# Patient Record
Sex: Male | Born: 1940 | Race: White | Hispanic: No | Marital: Married | Smoking: Former smoker
Health system: Southern US, Community
[De-identification: ages and names within clinical notes are randomized; demographics above are authoritative.]

## PROBLEM LIST (undated history)

## (undated) DIAGNOSIS — N184 Chronic kidney disease, stage 4 (severe): Secondary | ICD-10-CM

## (undated) DIAGNOSIS — I10 Essential (primary) hypertension: Secondary | ICD-10-CM

## (undated) DIAGNOSIS — I712 Thoracic aortic aneurysm, without rupture: Secondary | ICD-10-CM

## (undated) DIAGNOSIS — I480 Paroxysmal atrial fibrillation: Secondary | ICD-10-CM

## (undated) DIAGNOSIS — G589 Mononeuropathy, unspecified: Secondary | ICD-10-CM

## (undated) DIAGNOSIS — I509 Heart failure, unspecified: Secondary | ICD-10-CM

## (undated) DIAGNOSIS — Z87442 Personal history of urinary calculi: Secondary | ICD-10-CM

## (undated) DIAGNOSIS — S149XXA Injury of unspecified nerves of neck, initial encounter: Secondary | ICD-10-CM

## (undated) DIAGNOSIS — M1990 Unspecified osteoarthritis, unspecified site: Secondary | ICD-10-CM

## (undated) DIAGNOSIS — S065X9A Traumatic subdural hemorrhage with loss of consciousness of unspecified duration, initial encounter: Secondary | ICD-10-CM

## (undated) DIAGNOSIS — I428 Other cardiomyopathies: Secondary | ICD-10-CM

## (undated) DIAGNOSIS — S065XAA Traumatic subdural hemorrhage with loss of consciousness status unknown, initial encounter: Secondary | ICD-10-CM

## (undated) DIAGNOSIS — T8859XA Other complications of anesthesia, initial encounter: Secondary | ICD-10-CM

## (undated) DIAGNOSIS — D696 Thrombocytopenia, unspecified: Secondary | ICD-10-CM

## (undated) DIAGNOSIS — I7121 Aneurysm of the ascending aorta, without rupture: Secondary | ICD-10-CM

## (undated) DIAGNOSIS — C9110 Chronic lymphocytic leukemia of B-cell type not having achieved remission: Secondary | ICD-10-CM

## (undated) DIAGNOSIS — J189 Pneumonia, unspecified organism: Secondary | ICD-10-CM

## (undated) DIAGNOSIS — I2510 Atherosclerotic heart disease of native coronary artery without angina pectoris: Secondary | ICD-10-CM

## (undated) HISTORY — DX: Chronic kidney disease, stage 4 (severe): N18.4

## (undated) HISTORY — DX: Traumatic subdural hemorrhage with loss of consciousness status unknown, initial encounter: S06.5XAA

## (undated) HISTORY — DX: Thrombocytopenia, unspecified: D69.6

## (undated) HISTORY — DX: Traumatic subdural hemorrhage with loss of consciousness of unspecified duration, initial encounter: S06.5X9A

## (undated) HISTORY — PX: OTHER SURGICAL HISTORY: SHX169

## (undated) HISTORY — PX: TONSILLECTOMY: SUR1361

## (undated) HISTORY — DX: Paroxysmal atrial fibrillation: I48.0

## (undated) HISTORY — DX: Atherosclerotic heart disease of native coronary artery without angina pectoris: I25.10

## (undated) HISTORY — PX: THYROIDECTOMY, PARTIAL: SHX18

## (undated) HISTORY — DX: Other cardiomyopathies: I42.8

## (undated) HISTORY — PX: CATARACT EXTRACTION, BILATERAL: SHX1313

---

## 2011-06-15 ENCOUNTER — Encounter (INDEPENDENT_AMBULATORY_CARE_PROVIDER_SITE_OTHER): Payer: Self-pay | Admitting: *Deleted

## 2011-06-22 ENCOUNTER — Telehealth: Payer: Self-pay

## 2011-06-22 ENCOUNTER — Other Ambulatory Visit: Payer: Self-pay

## 2011-06-22 DIAGNOSIS — Z139 Encounter for screening, unspecified: Secondary | ICD-10-CM

## 2011-06-22 NOTE — Telephone Encounter (Signed)
Gastroenterology Pre-Procedure Form  Pt said he had last colonoscopy 10 years ago in [place]/ no polyps  Request Date: 06/22/2011       Requesting Physician: Dr. Wende Neighbors     PATIENT INFORMATION:  Andrew [name] is a 71 y.o., male (DOB=02/26/1941).  PROCEDURE: Procedure(s) requested: colonoscopy Procedure Reason: screening for colon cancer  PATIENT REVIEW QUESTIONS: The patient reports the following:   1. Diabetes Melitis: no 2. Joint replacements in the past 12 months: no 3. Major health problems in the past 3 months: no 4. Has an artificial valve or MVP:no 5. Has been advised in past to take antibiotics in advance of a procedure like teeth cleaning: no}    MEDICATIONS & ALLERGIES:    Patient reports the following regarding taking any blood thinners:   Plavix? no Aspirin?yes  Coumadin?  no  Patient confirms/reports the following medications:  Current Outpatient Prescriptions  Medication Sig Dispense Refill  . aspirin 325 MG tablet Take 325 mg by mouth daily.      . carvedilol (COREG) 12.5 MG tablet Take 12.5 mg by mouth 2 (two) times daily with a meal.      . Olmesartan-Amlodipine-HCTZ (TRIBENZOR) 40-10-25 MG TABS Take by mouth 1 day or 1 dose. Takes once daily        Patient confirms/reports the following allergies:  No Known Allergies  Patient is appropriate to schedule for requested procedure(s): yes  AUTHORIZATION INFORMATION Primary Insurance:   ID #:   Group #:  Pre-Cert / Auth required: Pre-Cert / Auth #:   Secondary Insurance:   ID #:  Group #:  Pre-Cert / Auth required:  Pre-Cert / Auth #:   No orders of the defined types were placed in this encounter.    SCHEDULE INFORMATION: Procedure has been scheduled as follows:  Date: 07/10/2011       Time: 10:30 AM  Location: [facility] Short Stay  This Gastroenterology Pre-Precedure Form is being routed to the following provider(s) for review: Barney Drain, MD

## 2011-06-22 NOTE — Telephone Encounter (Signed)
MOVI PREP SPLIT DOSING, REGULAR BREAKFAST. CLEAR LIQUIDS AFTER 9 AM.  

## 2011-06-22 NOTE — Telephone Encounter (Signed)
Rx and instructions mailed.

## 2011-06-26 ENCOUNTER — Encounter (HOSPITAL_COMMUNITY): Payer: Self-pay | Admitting: Pharmacy Technician

## 2011-07-07 MED ORDER — SODIUM CHLORIDE 0.45 % IV SOLN
Freq: Once | INTRAVENOUS | Status: AC
  Administered 2011-07-10: 10:00:00 via INTRAVENOUS

## 2011-07-10 ENCOUNTER — Other Ambulatory Visit: Payer: Self-pay | Admitting: Gastroenterology

## 2011-07-10 ENCOUNTER — Encounter (HOSPITAL_COMMUNITY): Payer: Self-pay | Admitting: *Deleted

## 2011-07-10 ENCOUNTER — Ambulatory Visit (HOSPITAL_COMMUNITY)
Admission: RE | Admit: 2011-07-10 | Discharge: 2011-07-10 | Disposition: A | Discharge status: Home / Self Care | Payer: Medicare Other | Source: Ambulatory Visit | Attending: Gastroenterology | Admitting: Gastroenterology

## 2011-07-10 ENCOUNTER — Encounter (HOSPITAL_COMMUNITY)
Admission: RE | Disposition: A | Discharge status: Home / Self Care | Payer: Self-pay | Source: Ambulatory Visit | Attending: Gastroenterology

## 2011-07-10 DIAGNOSIS — I10 Essential (primary) hypertension: Secondary | ICD-10-CM | POA: Insufficient documentation

## 2011-07-10 DIAGNOSIS — D126 Benign neoplasm of colon, unspecified: Secondary | ICD-10-CM | POA: Insufficient documentation

## 2011-07-10 DIAGNOSIS — Z1211 Encounter for screening for malignant neoplasm of colon: Secondary | ICD-10-CM | POA: Insufficient documentation

## 2011-07-10 DIAGNOSIS — Z79899 Other long term (current) drug therapy: Secondary | ICD-10-CM | POA: Insufficient documentation

## 2011-07-10 DIAGNOSIS — Z139 Encounter for screening, unspecified: Secondary | ICD-10-CM

## 2011-07-10 DIAGNOSIS — K5730 Diverticulosis of large intestine without perforation or abscess without bleeding: Secondary | ICD-10-CM

## 2011-07-10 DIAGNOSIS — K648 Other hemorrhoids: Secondary | ICD-10-CM | POA: Insufficient documentation

## 2011-07-10 DIAGNOSIS — Z7982 Long term (current) use of aspirin: Secondary | ICD-10-CM | POA: Insufficient documentation

## 2011-07-10 HISTORY — PX: COLONOSCOPY: SHX5424

## 2011-07-10 HISTORY — DX: Essential (primary) hypertension: I10

## 2011-07-10 SURGERY — COLONOSCOPY
Anesthesia: Moderate Sedation

## 2011-07-10 MED ORDER — MEPERIDINE HCL 100 MG/ML IJ SOLN
INTRAMUSCULAR | Status: DC | PRN
  Administered 2011-07-10: 50 mg via INTRAVENOUS
  Administered 2011-07-10: 25 mg via INTRAVENOUS

## 2011-07-10 MED ORDER — MIDAZOLAM HCL 5 MG/5ML IJ SOLN
INTRAMUSCULAR | Status: AC
  Filled 2011-07-10: qty 5

## 2011-07-10 MED ORDER — STERILE WATER FOR IRRIGATION IR SOLN
Status: DC | PRN
  Administered 2011-07-10: 10:00:00

## 2011-07-10 MED ORDER — MIDAZOLAM HCL 5 MG/5ML IJ SOLN
INTRAMUSCULAR | Status: DC | PRN
  Administered 2011-07-10 (×2): 2 mg via INTRAVENOUS

## 2011-07-10 MED ORDER — MEPERIDINE HCL 100 MG/ML IJ SOLN
INTRAMUSCULAR | Status: AC
  Filled 2011-07-10: qty 1

## 2011-07-10 NOTE — H&P (Signed)
  Primary Care Physician:  Wende Neighbors, MD, MD Primary Gastroenterologist:  Dr. Oneida Alar  Pre-Procedure History & Physical: HPI:  Andrew [name] is a 71 y.o. male here for COLON CANCER SCREENING.   Past Medical History  Diagnosis Date  . Hypertension   . Dysrhythmia     Hx. of Atrial Fibrillation- has resolved    Past Surgical History  Procedure Date  . Thyroidectomy, partial   . Cataract extraction, bilateral   . Tonsillectomy     Prior to Admission medications   Medication Sig Start Date End Date Taking? Authorizing Provider  aspirin 325 MG tablet Take 325 mg by mouth daily.   Yes Historical Provider, MD  carvedilol (COREG) 12.5 MG tablet Take 12.5 mg by mouth 2 (two) times daily with a meal.   Yes Historical Provider, MD  Multiple Vitamin (MULITIVITAMIN WITH MINERALS) TABS Take 1 tablet by mouth daily.   Yes Historical Provider, MD  Olmesartan-Amlodipine-HCTZ (TRIBENZOR) 40-10-25 MG TABS Take 1 tablet by mouth daily.    Yes Historical Provider, MD    Allergies as of 06/22/2011  . (No Known Allergies)    Family History  Problem Relation Age of Onset  . Colon cancer Neg Hx   NO COLON CA OR POLYPS   History   Social History  . Marital Status: Married    Spouse Name: N/A    Number of Children: N/A  . Years of Education: N/A   Occupational History  . Not on file.   Social History Main Topics  . Smoking status: Former Smoker -- 1.0 packs/day for 15 years  . Smokeless tobacco: Not on file  . Alcohol Use: No  . Drug Use: No  . Sexually Active:    Other Topics Concern  . Not on file   Social History Narrative  . No narrative on file    Review of Systems: See HPI, otherwise negative ROS   Physical Exam: BP 160/96  Pulse 70  Temp(Src) 98.2 F (36.8 C) (Oral)  Resp 18  Ht 6\' 3"  (1.905 m)  Wt 230 lb (104.327 kg)  BMI 28.75 kg/m2  SpO2 94% General:   Alert,  pleasant and cooperative in NAD Head:  Normocephalic and atraumatic. Neck:  Supple. Lungs:   Clear throughout to auscultation.    Heart:  Regular rate and rhythm. Abdomen:  Soft, nontender and nondistended. Normal bowel sounds, without guarding, and without rebound.   Neurologic:  Alert and  oriented x4;  grossly normal neurologically.  Impression/Plan:     AVERAGE RISK  PLAN: TCS TODAY

## 2011-07-11 NOTE — Op Note (Signed)
[facility] [address] [place], [place]  [address]  COLONOSCOPY PROCEDURE REPORT  PATIENT:  Andrew [name], Andrew [name]  MR#:  [id number] BIRTHDATE:  [birth year], 70 yrs. old  GENDER:  male  ENDOSCOPIST:  Barney Drain, MD REF. BY:  Delphina Cahill, M.D. ASSISTANT:  PROCEDURE DATE:  07/10/2011 PROCEDURE:  Colonoscopy with biopsy and snare polypectomy  INDICATIONS:  SCREENING  MEDICATIONS:   Demerol 75 mg IV, Versed 4 mg IV  DESCRIPTION OF PROCEDURE:    Physical exam was performed. Informed consent was obtained from the patient after explaining the benefits, risks, and alternatives to procedure.  The patient was connected to monitor and placed in left lateral position. Continuous oxygen was provided by nasal cannula and IV medicine administered through an indwelling cannula.  After administration of sedation and rectal exam, the patient's rectum was intubated and the [id number] colonoscope was advanced under direct visualization to the cecum.  The scope was removed slowly by carefully examining the color, texture, anatomy, and integrity mucosa on the way out.  The patient was recovered in endoscopy and discharged home in satisfactory condition. <<PROCEDUREIMAGES>>  FINDINGS:  A 3 MM sessile polyp was found in the proximal transverse colon & REMOVED VIA COLD FORCEPS.  A  4 MM & 6 MM sessile polyp wERE found in the ascending colon & REMOVED VIA SNARE CAUTERY/COLD FORCEPS .  A 3 MM & 8 MM sessile polyp wERE found in the sigmoid colon & REMOVED VIA SNARE CAUTERY/COLD FORCEPS.  FREQUENT Diverticula were found in the sigmoid to descending colon segments.  SMALL Hemorrhoids were found.  PREP QUALITY: EXCELLENT CECAL W/D TIME:    26 minutes  COMPLICATIONS:    None  ENDOSCOPIC IMPRESSION: 1) Sessile polyp in the proximal transverse colon 2) Sessile polyp in the ascending colon 3) Sessile polyp in the sigmoid colon 4) Diverticula in the sigmoid to descending colon segments 5)  Internal hemorrhoids  RECOMMENDATIONS: AWAIT BIOPSIES TCS IN 10 YEARS W/ 1 HOUR TIME SLOT HIGH FIBER DIET  REPEAT EXAM:  No  ______________________________ Barney Drain, MD  CC:  Delphina Cahill, M.D.  n. eSIGNED:   Kimika Streater at 07/11/2011 03:27 PM  Valentina Lucks, [id number]

## 2011-07-13 ENCOUNTER — Telehealth: Payer: Self-pay | Admitting: Gastroenterology

## 2011-07-13 NOTE — Telephone Encounter (Signed)
Please call pt. 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 TCS in 10-15 YEARS. FOLLOW A High fiber diet.

## 2011-07-13 NOTE — Telephone Encounter (Signed)
Pt informed

## 2011-07-13 NOTE — Telephone Encounter (Signed)
Reminder in epic to have tcs in 10-15 yrs

## 2011-07-17 ENCOUNTER — Encounter (HOSPITAL_COMMUNITY): Payer: Self-pay | Admitting: Gastroenterology

## 2011-07-17 NOTE — Telephone Encounter (Signed)
Faxed to PCP

## 2013-01-12 ENCOUNTER — Encounter (HOSPITAL_COMMUNITY): Payer: Self-pay | Admitting: Emergency Medicine

## 2013-01-12 ENCOUNTER — Emergency Department (HOSPITAL_COMMUNITY)
Admission: EM | Admit: 2013-01-12 | Discharge: 2013-01-12 | Disposition: A | Discharge status: Home / Self Care | Payer: Medicare Other | Attending: Emergency Medicine | Admitting: Emergency Medicine

## 2013-01-12 DIAGNOSIS — M62830 Muscle spasm of back: Secondary | ICD-10-CM

## 2013-01-12 DIAGNOSIS — M5380 Other specified dorsopathies, site unspecified: Secondary | ICD-10-CM | POA: Insufficient documentation

## 2013-01-12 DIAGNOSIS — Z87891 Personal history of nicotine dependence: Secondary | ICD-10-CM | POA: Insufficient documentation

## 2013-01-12 DIAGNOSIS — I10 Essential (primary) hypertension: Secondary | ICD-10-CM | POA: Insufficient documentation

## 2013-01-12 DIAGNOSIS — M2540 Effusion, unspecified joint: Secondary | ICD-10-CM | POA: Insufficient documentation

## 2013-01-12 DIAGNOSIS — R209 Unspecified disturbances of skin sensation: Secondary | ICD-10-CM | POA: Insufficient documentation

## 2013-01-12 DIAGNOSIS — Z791 Long term (current) use of non-steroidal anti-inflammatories (NSAID): Secondary | ICD-10-CM | POA: Insufficient documentation

## 2013-01-12 DIAGNOSIS — Z7982 Long term (current) use of aspirin: Secondary | ICD-10-CM | POA: Insufficient documentation

## 2013-01-12 DIAGNOSIS — Z8669 Personal history of other diseases of the nervous system and sense organs: Secondary | ICD-10-CM | POA: Insufficient documentation

## 2013-01-12 DIAGNOSIS — Z79899 Other long term (current) drug therapy: Secondary | ICD-10-CM | POA: Insufficient documentation

## 2013-01-12 DIAGNOSIS — Z8679 Personal history of other diseases of the circulatory system: Secondary | ICD-10-CM | POA: Insufficient documentation

## 2013-01-12 HISTORY — DX: Injury of unspecified nerves of neck, initial encounter: S14.9XXA

## 2013-01-12 HISTORY — DX: Mononeuropathy, unspecified: G58.9

## 2013-01-12 MED ORDER — HYDROCODONE-ACETAMINOPHEN 5-325 MG PO TABS
1.0000 | ORAL_TABLET | Freq: Once | ORAL | Status: AC
  Administered 2013-01-12: 1 via ORAL
  Filled 2013-01-12: qty 1

## 2013-01-12 MED ORDER — METHOCARBAMOL 500 MG PO TABS: 500.0000 mg | ORAL_TABLET | Freq: Two times a day (BID) | ORAL | Status: DC

## 2013-01-12 MED ORDER — HYDROCODONE-ACETAMINOPHEN 5-325 MG PO TABS: 1.0000 | ORAL_TABLET | Freq: Four times a day (QID) | ORAL | Status: DC | PRN

## 2013-01-12 MED ORDER — DIAZEPAM 2 MG PO TABS
2.0000 mg | ORAL_TABLET | Freq: Once | ORAL | Status: AC
  Administered 2013-01-12: 2 mg via ORAL
  Filled 2013-01-12: qty 1

## 2013-01-12 NOTE — ED Notes (Signed)
Pt being treated by Dr [place] Crane for pinched nerve in neck. States pain too bad started yesterday. Takes dicoflenac but not helping. Pt rating 3 at this time to right side of neck radiating into shoulder and mid upper arm. nad at this time.

## 2013-01-12 NOTE — ED Provider Notes (Signed)
CSN: [id number]     Arrival date & time 01/12/13  [id number] History    This chart was scribed for Varney Biles, MD,  by Stacy Gardner, ED Scribe. The patient was seen in room APA19/APA19 and the patient's care was started at 8:49 AM    First MD Initiated Contact with Patient 01/12/13 [phone]     Chief Complaint  Patient presents with  . Neck Pain   (Consider location/radiation/quality/duration/timing/severity/associated sxs/prior Treatment) HPI HPI Comments: RAHEM DUNNER is a 72 y.o. male who presents to the Emergency Department complaining of radiating neck pain for 12 days and worsened yesterday.Pt describes the pain as deep aching with tingling sensation that radiates between his neck and scapular region. Pt tried heating pad with slight improvement. Pt was previously dx with a pinched nerve in his neck and is currently takes prescribed pain medication. He denies any other medical complications other than hypertension. Pt tried a heating pad application however nothing seems to relieve the pain.  Pt [facility] is Dr. Nevada Crane  Past Medical History  Diagnosis Date  . Hypertension   . Dysrhythmia     Hx. of Atrial Fibrillation- has resolved  . Pinched nerve in neck    Past Surgical History  Procedure Laterality Date  . Thyroidectomy, partial    . Cataract extraction, bilateral    . Tonsillectomy    . Colonoscopy  07/10/2011    Procedure: COLONOSCOPY;  Surgeon: Dorothyann Peng, MD;  Location: [facility] END[facility];  Service: Endoscopy;  Laterality: N/A;  10:30 AM   Family History  Problem Relation Age of Onset  . Colon cancer Neg Hx    History  Substance Use Topics  . Smoking status: Former Smoker -- 1.00 packs/day for 15 years  . Smokeless tobacco: Not on file  . Alcohol Use: No    Review of Systems  Constitutional: Positive for activity change.  HENT: Positive for neck pain.   Respiratory: Negative for chest tightness and shortness of breath.   Cardiovascular: Negative for  chest pain.  Gastrointestinal: Negative for nausea and vomiting.  Musculoskeletal: Positive for joint swelling. Negative for back pain.  Neurological: Positive for numbness.  All other systems reviewed and are negative.    Allergies  Review of patient's allergies indicates no known allergies.  Home Medications   Current Outpatient Rx  Name  Route  Sig  Dispense  Refill  . aspirin 325 MG tablet   Oral   Take 325 mg by mouth daily.         . carvedilol (COREG) 12.5 MG tablet   Oral   Take 12.5 mg by mouth 2 (two) times daily with a meal.         . Coenzyme Q10 (COQ10) 100 MG CAPS   Oral   Take 1 tablet by mouth daily.         . diclofenac (VOLTAREN) 75 MG EC tablet   Oral   Take 75 mg by mouth 2 (two) times daily.         . fish oil-omega-3 fatty acids 1000 MG capsule   Oral   Take 1 g by mouth 2 (two) times daily.         . Olmesartan-Amlodipine-HCTZ (TRIBENZOR) 40-10-25 MG TABS   Oral   Take 1 tablet by mouth daily.          . Multiple Vitamin (MULITIVITAMIN WITH MINERALS) TABS   Oral   Take 1 tablet by mouth daily.  BP 150/89  Pulse 62  Temp(Src) 97.7 F (36.5 C) (Oral)  Resp 18  SpO2 95% Physical Exam  Nursing note and vitals reviewed. Constitutional: He is oriented to person, place, and time. He appears well-developed and well-nourished. No distress.  HENT:  Head: Normocephalic and atraumatic.  Mouth/Throat: Oropharynx is clear and moist.  Eyes: Conjunctivae are normal. Pupils are equal, round, and reactive to light. No scleral icterus.  Neck: Neck supple.  Cardiovascular: Normal rate, regular rhythm, normal heart sounds and intact distal pulses.   No murmur heard. Pulmonary/Chest: Effort normal and breath sounds normal. No stridor. No respiratory distress. He has no wheezes. He has no rales.  Abdominal: Soft. He exhibits no distension. There is no tenderness.  Musculoskeletal: Normal range of motion. He exhibits edema and  tenderness.  No AC or GH tenderness R shoulder  No bulging tenderness  Sensory Motor exam is normal Internal and external rotation of the shoulder intact non tender.  Strength intact No midline C Spine tenderness Tender in scapular region Evidence of  Edema around the scapular region No abcess appreciated ROM of shoulder intact.   Neurological: He is alert and oriented to person, place, and time. No cranial nerve deficit. He exhibits normal muscle tone. Coordination normal.  Skin: Skin is warm and dry. No rash noted.  Psychiatric: He has a normal mood and affect. His behavior is normal.    ED Course  DIAGNOSTIC STUDIES: Oxygen Saturation is 95% on room air, normal by my interpretation.    COORDINATION OF CARE: 8:58 AM Discussed course of care with pt which includes modified stretching exercises and Lidocain. Pt understands and agrees.   Procedures (including critical care time)  Labs Reviewed - No data to display No results found. No diagnosis found.  MDM  I personally performed the services described in this documentation, which was scribed in my presence. The recorded information has been reviewed and is accurate.  PT comes in with cc of right shoulder pain and some tingling. No hx of neck trauma, shoulder trauma. No signs of infection, and ROM is intact for the shoulder and neck. Neuro exam is normal, and so is the vascular exam. Possibly muscle spasms, leading to irritation. Will add muscle relaxant, and prn narcotic pain meds. PCP f/u continuation recommeded  Varney Biles, MD 01/12/13 [phone]

## 2014-09-07 ENCOUNTER — Encounter (HOSPITAL_COMMUNITY): Payer: Medicare HMO | Attending: Hematology & Oncology | Admitting: Hematology & Oncology

## 2014-09-07 VITALS — BP 125/65 | HR 60 | Temp 98.4°F | Resp 16 | Ht 72.5 in | Wt 240.0 lb

## 2014-09-07 DIAGNOSIS — D696 Thrombocytopenia, unspecified: Secondary | ICD-10-CM | POA: Diagnosis not present

## 2014-09-07 DIAGNOSIS — R591 Generalized enlarged lymph nodes: Secondary | ICD-10-CM | POA: Diagnosis not present

## 2014-09-07 DIAGNOSIS — C9110 Chronic lymphocytic leukemia of B-cell type not having achieved remission: Secondary | ICD-10-CM | POA: Insufficient documentation

## 2014-09-07 LAB — CBC WITH DIFFERENTIAL/PLATELET
BASOS ABS: 0 10*3/uL (ref 0.0–0.1)
BASOS PCT: 0 % (ref 0–1)
EOS ABS: 0.3 10*3/uL (ref 0.0–0.7)
EOS PCT: 3 % (ref 0–5)
HEMATOCRIT: 45 % (ref 39.0–52.0)
Hemoglobin: 16 g/dL (ref 13.0–17.0)
LYMPHS PCT: 59 % — AB (ref 12–46)
Lymphs Abs: 6.2 10*3/uL — ABNORMAL HIGH (ref 0.7–4.0)
MCH: 32.3 pg (ref 26.0–34.0)
MCHC: 35.6 g/dL (ref 30.0–36.0)
MCV: 90.9 fL (ref 78.0–100.0)
Monocytes Absolute: 0.5 10*3/uL (ref 0.1–1.0)
Monocytes Relative: 5 % (ref 3–12)
Neutro Abs: 3.4 10*3/uL (ref 1.7–7.7)
Neutrophils Relative %: 33 % — ABNORMAL LOW (ref 43–77)
PLATELETS: 113 10*3/uL — AB (ref 150–400)
RBC: 4.95 MIL/uL (ref 4.22–5.81)
RDW: 12.7 % (ref 11.5–15.5)
WBC: 10.5 10*3/uL (ref 4.0–10.5)

## 2014-09-07 LAB — COMPREHENSIVE METABOLIC PANEL
ALK PHOS: 44 U/L (ref 39–117)
ALT: 22 U/L (ref 0–53)
AST: 29 U/L (ref 0–37)
Albumin: 4.4 g/dL (ref 3.5–5.2)
Anion gap: 10 (ref 5–15)
BUN: 23 mg/dL (ref 6–23)
CALCIUM: 9.4 mg/dL (ref 8.4–10.5)
CO2: 24 mmol/L (ref 19–32)
Chloride: 104 mmol/L (ref 96–112)
Creatinine, Ser: 1.1 mg/dL (ref 0.50–1.35)
GFR, EST AFRICAN AMERICAN: 74 mL/min — AB (ref >90)
GFR, EST NON AFRICAN AMERICAN: 64 mL/min — AB (ref >90)
GLUCOSE: 110 mg/dL — AB (ref 70–99)
POTASSIUM: 3.6 mmol/L (ref 3.5–5.1)
Sodium: 138 mmol/L (ref 135–145)
Total Bilirubin: 0.8 mg/dL (ref 0.3–1.2)
Total Protein: 6.8 g/dL (ref 6.0–8.3)

## 2014-09-07 LAB — LACTATE DEHYDROGENASE: LDH: 166 U/L (ref 94–250)

## 2014-09-07 LAB — SEDIMENTATION RATE: SED RATE: 1 mm/h (ref 0–16)

## 2014-09-07 NOTE — Patient Instructions (Addendum)
..  [place] at [facility] Discharge Instructions  RECOMMENDATIONS MADE BY THE CONSULTANT AND ANY TEST RESULTS WILL BE SENT TO YOUR REFERRING PHYSICIAN.  labwork today and we will set you up for CT scans  Return Thursday of next week  Thank you for choosing [place] at [facility] to provide your oncology and hematology care.  To afford each patient quality time with our provider, please arrive at least 15 minutes before your scheduled appointment time.    You need to re-schedule your appointment should you arrive 10 or more minutes late.  We strive to give you quality time with our providers, and arriving late affects you and other patients whose appointments are after yours.  Also, if you no show three or more times for appointments you may be dismissed from the clinic at the providers discretion.     Again, thank you for choosing [facility].  Our hope is that these requests will decrease the amount of time that you wait before being seen by our physicians.       _____________________________________________________________  Should you have questions after your visit to [facility], please contact our office at [phone] between the hours of 8:30 a.m. and 4:30 p.m.  Voicemails left after 4:30 p.m. will not be returned until the following business day.  For prescription refill requests, have your pharmacy contact our office.

## 2014-09-07 NOTE — Progress Notes (Signed)
Andrew [name] presented for labwork. Labs per MD order drawn via Peripheral Line 25 gauge needle inserted in rt ac.  Good blood return present. Procedure without incident.  Needle removed intact. Patient tolerated procedure well.

## 2014-09-08 LAB — PSA: PSA: 2.93 ng/mL (ref <=4.00)

## 2014-09-10 ENCOUNTER — Ambulatory Visit (HOSPITAL_COMMUNITY)
Admission: RE | Admit: 2014-09-10 | Discharge: 2014-09-10 | Disposition: A | Discharge status: Home / Self Care | Payer: Medicare HMO | Source: Ambulatory Visit | Attending: Hematology & Oncology | Admitting: Hematology & Oncology

## 2014-09-10 DIAGNOSIS — D696 Thrombocytopenia, unspecified: Secondary | ICD-10-CM | POA: Diagnosis not present

## 2014-09-10 DIAGNOSIS — Z87891 Personal history of nicotine dependence: Secondary | ICD-10-CM | POA: Diagnosis not present

## 2014-09-10 DIAGNOSIS — R591 Generalized enlarged lymph nodes: Secondary | ICD-10-CM | POA: Insufficient documentation

## 2014-09-10 MED ORDER — IOHEXOL 300 MG/ML  SOLN
80.0000 mL | Freq: Once | INTRAMUSCULAR | Status: AC | PRN
  Administered 2014-09-10: 80 mL via INTRAVENOUS

## 2014-09-17 ENCOUNTER — Encounter (HOSPITAL_BASED_OUTPATIENT_CLINIC_OR_DEPARTMENT_OTHER): Payer: Medicare HMO | Admitting: Hematology & Oncology

## 2014-09-17 VITALS — BP 136/80 | HR 55 | Temp 98.4°F | Resp 18 | Wt 239.4 lb

## 2014-09-17 DIAGNOSIS — R599 Enlarged lymph nodes, unspecified: Secondary | ICD-10-CM | POA: Diagnosis not present

## 2014-09-17 DIAGNOSIS — D696 Thrombocytopenia, unspecified: Secondary | ICD-10-CM | POA: Diagnosis not present

## 2014-09-17 DIAGNOSIS — C9110 Chronic lymphocytic leukemia of B-cell type not having achieved remission: Secondary | ICD-10-CM

## 2014-09-17 NOTE — Patient Instructions (Addendum)
[place] at [facility] Discharge Instructions  RECOMMENDATIONS MADE BY THE CONSULTANT AND ANY TEST RESULTS WILL BE SENT TO YOUR REFERRING PHYSICIAN.  Exam per Dr.Penland.  Return in 3 weeks for labs, doctor's visit, and Prevnar injection. Arrive @ 12:20.  Bring all questions with you.    Thank you for choosing [place] at [facility] to provide your oncology and hematology care.  To afford each patient quality time with our provider, please arrive at least 15 minutes before your scheduled appointment time.    You need to re-schedule your appointment should you arrive 10 or more minutes late.  We strive to give you quality time with our providers, and arriving late affects you and other patients whose appointments are after yours.  Also, if you no show three or more times for appointments you may be dismissed from the clinic at the providers discretion.     Again, thank you for choosing [facility].  Our hope is that these requests will decrease the amount of time that you wait before being seen by our physicians.       _____________________________________________________________  Should you have questions after your visit to [facility], please contact our office at [phone] between the hours of 8:30 a.m. and 4:30 p.m.  Voicemails left after 4:30 p.m. will not be returned until the following business day.  For prescription refill requests, have your pharmacy contact our office.   Pneumococcal Vaccine, Polyvalent suspension for injection What is this medicine? PNEUMOCOCCAL VACCINE, POLYVALENT (NEU mo KOK al vak SEEN, pol ee VEY luhnt) is a vaccine to prevent pneumococcus bacteria infection. These bacteria are a major cause of ear infections, 'Strep throat' infections, and serious pneumonia, meningitis, or blood infections worldwide. These vaccines help the body to produce antibodies (protective substances) that  help your body defend against these bacteria. This vaccine is recommended for infants and young children. This vaccine will not treat an infection. This medicine may be used for other purposes; ask your health care provider or pharmacist if you have questions. COMMON BRAND NAME(S): Prevnar 13 What should I tell my health care provider before I take this medicine? They need to know if you have any of these conditions: -bleeding problems -fever -immune system problems -low platelet count in the blood -seizures -an unusual or allergic reaction to pneumococcal vaccine, diphtheria toxoid, other vaccines, latex, other medicines, foods, dyes, or preservatives -pregnant or trying to get pregnant -breast-feeding How should I use this medicine? This vaccine is for injection into a muscle. It is given by a health care professional. A copy of Vaccine Information Statements will be given before each vaccination. Read this sheet carefully each time. The sheet may change frequently. Talk to your pediatrician regarding the use of this medicine in children. While this drug may be prescribed for children as young as 97 weeks old for selected conditions, precautions do apply. Overdosage: If you think you have taken too much of this medicine contact a poison control center or emergency room at once. NOTE: This medicine is only for you. Do not share this medicine with others. What if I miss a dose? It is important not to miss your dose. Call your doctor or health care professional if you are unable to keep an appointment. What may interact with this medicine? -medicines for cancer chemotherapy -medicines that suppress your immune function -medicines that treat or prevent blood clots like warfarin, enoxaparin, and  dalteparin -steroid medicines like prednisone or cortisone This list may not describe all possible interactions. Give your health care provider a list of all the medicines, herbs, non-prescription  drugs, or dietary supplements you use. Also tell them if you smoke, drink alcohol, or use illegal drugs. Some items may interact with your medicine. What should I watch for while using this medicine? Mild fever and pain should go away in 3 days or less. Report any unusual symptoms to your doctor or health care professional. What side effects may I notice from receiving this medicine? Side effects that you should report to your doctor or health care professional as soon as possible: -allergic reactions like skin rash, itching or hives, swelling of the face, lips, or tongue -breathing problems -confused -fever over 102 degrees F -pain, tingling, numbness in the hands or feet -seizures -unusual bleeding or bruising -unusual muscle weakness Side effects that usually do not require medical attention (report to your doctor or health care professional if they continue or are bothersome): -aches and pains -diarrhea -fever of 102 degrees F or less -headache -irritable -loss of appetite -pain, tender at site where injected -trouble sleeping This list may not describe all possible side effects. Call your doctor for medical advice about side effects. You may report side effects to FDA at 1-800-FDA-1088. Where should I keep my medicine? This does not apply. This vaccine is given in a clinic, pharmacy, doctor's office, or other health care setting and will not be stored at home. NOTE: This sheet is a summary. It may not cover all possible information. If you have questions about this medicine, talk to your doctor, pharmacist, or health care provider.  2015, [employer]vier/Gold Standard. (2008-07-28 10:17:22)

## 2014-09-27 ENCOUNTER — Encounter (HOSPITAL_COMMUNITY): Payer: Self-pay | Admitting: Hematology & Oncology

## 2014-09-27 NOTE — Progress Notes (Signed)
[facility] NOTE  Patient Care Team: Delphina Cahill, MD as PCP - General (Internal Medicine)  CHIEF COMPLAINTS/PURPOSE OF CONSULTATION:  Thrombocytopenia  HISTORY OF PRESENTING ILLNESS:  Andrew [name] 74 y.o. male is here because of low platelets.  He denies any specific symptoms related to thrombocytopenia such as bleeding of bruising.  He is active and denies any significant fatigue or change in weight or appetite. He has no B symptoms.  He follows with Dr. Nevada Crane for his primary care needs. 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 Other blood counts are preserved. Labs from 06/2012 show a platelet count of 128K. Labs from 08/06/2014 show a platelet count of 103K. Again all other counts are preserved. Labs on 3/10 do include a differential which slows low granulocytes at 23%, increased monocytes at 13% and atypical lymphocytes at 24%.   PSA was also noted to be mildly elevated at 4.09..  He is here today for additional evaluation.   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   MEDICAL HISTORY:  Past Medical History  Diagnosis Date  . Hypertension   . Dysrhythmia     Hx. of Atrial Fibrillation- has resolved  . Pinched nerve in neck     SURGICAL HISTORY: Past Surgical History  Procedure Laterality Date  . Thyroidectomy, partial    . Cataract extraction, bilateral    . Tonsillectomy    . Colonoscopy  07/10/2011    Procedure: COLONOSCOPY;  Surgeon: Dorothyann Peng, MD;  [facility];  Service: Endoscopy;  Laterality: N/A;  10:30 AM    SOCIAL HISTORY: History   Social History  . Marital Status: Married    Spouse Name: N/A  . Number of Children: N/A  . Years of Education: N/A   Occupational History  . Not on file.   Social History Main Topics  . Smoking status: Former Smoker -- 1.00 packs/day for 15 years  . Smokeless  tobacco: Not on file  . Alcohol Use: No  . Drug Use: No  . Sexual Activity: Not on file   Other Topics Concern  . Not on file   Social History Narrative  . No narrative on file  Married for 48 years. 3 children, 6 grandchildren. 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 He smoked one ppd for 15 years and quit in 1972. No ETOH or chewing tobacco  FAMILY HISTORY: Family History  Problem Relation Age of Onset  . Colon cancer Neg Hx    has no family status information on file.   Father died at 57 from an MI. He states they found "cancer" at his autopsy. Mother died at 90 from alzheimers.  One brother and sister who are healthy.  ALLERGIES:  has No Known Allergies.  MEDICATIONS:  Current Outpatient Prescriptions  Medication Sig Dispense Refill  . AMLODIPINE BESYLATE PO Take 10 mg by mouth daily.    Marland Kitchen aspirin 325 MG tablet Take 325 mg by mouth daily.    . carvedilol (COREG) 12.5 MG tablet Take 12.5 mg by mouth 2 (two) times daily with a meal.    . Coenzyme Q10 (COQ10) 100 MG CAPS Take 1 tablet by mouth daily.    . fish oil-omega-3 fatty acids 1000 MG capsule Take 1 g by mouth 2 (two) times  daily.    . hydrochlorothiazide (HYDRODIURIL) 25 MG tablet Take 25 mg by mouth daily.    . Multiple Vitamin (MULITIVITAMIN WITH MINERALS) TABS Take 1 tablet by mouth daily.    Marland Kitchen olmesartan (BENICAR) 40 MG tablet Take 40 mg by mouth daily.    . diclofenac (VOLTAREN) 75 MG EC tablet Take 75 mg by mouth 2 (two) times daily.     No current facility-administered medications for this visit.    Review of Systems  Constitutional: Negative for fever, chills, weight loss and malaise/fatigue.  HENT: Negative for congestion, hearing loss, nosebleeds, sore throat and tinnitus.   Eyes: Negative for blurred vision, double vision, pain and discharge.  Respiratory: Negative for cough, hemoptysis, sputum production, shortness of breath and wheezing.   Cardiovascular:  Negative for chest pain, palpitations, claudication, leg swelling and PND.  Gastrointestinal: Negative for heartburn, nausea, vomiting, abdominal pain, diarrhea, constipation, blood in stool and melena.  Genitourinary: Negative for dysuria, urgency, frequency and hematuria.  Musculoskeletal: Negative for myalgias, joint pain and falls.  Skin: Negative for itching and rash.  Neurological: Negative for dizziness, tingling, tremors, sensory change, speech change, focal weakness, seizures, loss of consciousness, weakness and headaches.  Endo/Heme/Allergies: Does not bruise/bleed easily.  Psychiatric/Behavioral: Negative for depression, suicidal ideas, memory loss and substance abuse. The patient is not nervous/anxious and does not have insomnia.    14 point ROS was done and is otherwise as detailed above or in HPI   PHYSICAL EXAMINATION: ECOG PERFORMANCE STATUS: 0 - Asymptomatic  Filed Vitals:   09/07/14 1400  BP: 125/65  Pulse: 60  Temp: 98.4 F (36.9 C)  Resp: 16   Filed Weights   09/07/14 1400  Weight: 240 lb (108.863 kg)     Physical Exam  Constitutional: He is oriented to person, place, and time and well-developed, well-nourished, and in no distress.  HENT:  Head: Normocephalic and atraumatic.  Nose: Nose normal.  Mouth/Throat: Oropharynx is clear and moist. No oropharyngeal exudate.  Eyes: Conjunctivae and EOM are normal. Pupils are equal, round, and reactive to light. Right eye exhibits no discharge. Left eye exhibits no discharge. No scleral icterus.  Neck: Normal range of motion. Neck supple. No tracheal deviation present. No thyromegaly present.  Cardiovascular: Normal rate, regular rhythm and normal heart sounds.  Exam reveals no gallop and no friction rub.   No murmur heard. Pulmonary/Chest: Effort normal and breath sounds normal. He has no wheezes. He has no rales.  Abdominal: Soft. Bowel sounds are normal. He exhibits no distension and no mass. There is no  tenderness. There is no rebound and no guarding.  Musculoskeletal: Normal range of motion. He exhibits no edema.  Lymphadenopathy:    He has cervical adenopathy.       Left cervical: Superficial cervical and posterior cervical adenopathy present.       Left: Supraclavicular adenopathy present.  Largest is 2 cm in diameter and mobile  Neurological: He is alert and oriented to person, place, and time. He has normal reflexes. No cranial nerve deficit. Gait normal. Coordination normal.  Skin: Skin is warm and dry. No rash noted.  Psychiatric: Mood, memory, affect and judgment normal.  Nursing note and vitals reviewed.    LABORATORY DATA:  I have reviewed the data as listed Lab Results  Component Value Date   WBC 10.5 09/07/2014   HGB 16.0 09/07/2014   HCT 45.0 09/07/2014   MCV 90.9 09/07/2014   PLT 113* 09/07/2014     Chemistry  Component Value Date/Time   NA 138 09/07/2014 1540   K 3.6 09/07/2014 1540   CL 104 09/07/2014 1540   CO2 24 09/07/2014 1540   BUN 23 09/07/2014 1540   CREATININE 1.10 09/07/2014 1540      Component Value Date/Time   CALCIUM 9.4 09/07/2014 1540   ALKPHOS 44 09/07/2014 1540   AST 29 09/07/2014 1540   ALT 22 09/07/2014 1540   BILITOT 0.8 09/07/2014 1540      ASSESSMENT & PLAN:   Chronic Thrombocytopenia Lymphadenopathy Peripheral smear with atypical lymphocytes reported  Based upon his laboratory studies and physical exam I suspect a lymphomatous process or possibly CLL/SLL.  I discussed my suspicion with the patient and his wife.  I advised them that we would proceed today with multiple peripheral studies and I have also recommended a CT of the C/A/P based upon his adenopathy.  We discussed the often chronic and indolent nature of many lymphomas and of CLL in general.  We will regroup within the next 2 weeks to review the results at which time I advised the patient and his wife that we will most likely have a definitive diagnosis.    Orders  Placed This Encounter  Procedures  . CT Chest W Contrast    Standing Status: Future     Number of Occurrences: 1     Standing Expiration Date: 09/07/2015    Order Specific Question:  Reason for Exam (SYMPTOM  OR DIAGNOSIS REQUIRED)    Answer:  lymphadenopathy left neck left supraclavicular region    Order Specific Question:  Preferred imaging location?    Answer:  [facility]  . CT Soft Tissue Neck W Contrast    Standing Status: Future     Number of Occurrences: 1     Standing Expiration Date: 09/07/2015    Order Specific Question:  Reason for Exam (SYMPTOM  OR DIAGNOSIS REQUIRED)    Answer:  lymphadenopathy Left neck    Order Specific Question:  Preferred imaging location?    Answer:  [facility]  . CBC with Differential    Standing Status: Future     Number of Occurrences: 1     Standing Expiration Date: 09/07/2015  . Comprehensive metabolic panel    Standing Status: Future     Number of Occurrences: 1     Standing Expiration Date: 09/07/2015  . Lactate dehydrogenase    Standing Status: Future     Number of Occurrences: 1     Standing Expiration Date: 09/07/2015  . Sedimentation rate    Standing Status: Future     Number of Occurrences: 1     Standing Expiration Date: 09/07/2015  . PSA    Standing Status: Future     Number of Occurrences: 1     Standing Expiration Date: 09/07/2015    All questions were answered. The patient knows to call the clinic with any problems, questions or concerns. I have advised them to write down any questions to bring with them to their next follow-up.  They are in agreement with the plan as detailed. This note was electronically signed.    Molli Hazard, MD  09/27/2014 4:02 PM

## 2014-09-29 ENCOUNTER — Encounter (HOSPITAL_COMMUNITY): Payer: Medicare HMO | Attending: Hematology & Oncology

## 2014-09-29 DIAGNOSIS — R591 Generalized enlarged lymph nodes: Secondary | ICD-10-CM | POA: Diagnosis not present

## 2014-09-29 DIAGNOSIS — D696 Thrombocytopenia, unspecified: Secondary | ICD-10-CM | POA: Diagnosis not present

## 2014-09-29 DIAGNOSIS — C9110 Chronic lymphocytic leukemia of B-cell type not having achieved remission: Secondary | ICD-10-CM | POA: Insufficient documentation

## 2014-09-29 NOTE — Progress Notes (Signed)
Lab draw

## 2014-09-30 LAB — IGG, IGA, IGM
IGA: 43 mg/dL — AB (ref 61–437)
IGM, SERUM: 25 mg/dL (ref 15–143)
IgG (Immunoglobin G), Serum: 589 mg/dL — ABNORMAL LOW (ref 700–1600)

## 2014-09-30 LAB — PROTEIN ELECTROPHORESIS, SERUM
A/G Ratio: 1.8 (ref 0.7–2.0)
Albumin ELP: 3.9 g/dL (ref 3.2–5.6)
Alpha-1-Globulin: 0.2 g/dL (ref 0.1–0.4)
Alpha-2-Globulin: 0.6 g/dL (ref 0.4–1.2)
BETA GLOBULIN: 0.9 g/dL (ref 0.6–1.3)
GAMMA GLOBULIN: 0.6 g/dL (ref 0.5–1.6)
GLOBULIN, TOTAL: 2.2 g/dL (ref 2.0–4.5)
Total Protein ELP: 6.1 g/dL (ref 6.0–8.5)

## 2014-10-01 LAB — IMMUNOFIXATION ELECTROPHORESIS
IGG (IMMUNOGLOBIN G), SERUM: 613 mg/dL — AB (ref 700–1600)
IgA: 45 mg/dL — ABNORMAL LOW (ref 61–437)
IgM, Serum: 30 mg/dL (ref 15–143)
Total Protein ELP: 6.1 g/dL (ref 6.0–8.5)

## 2014-10-01 LAB — ZAP-70
VIABILITY ZAP70: 83 %
ZAP-70: 7 % (ref <10)

## 2014-10-09 ENCOUNTER — Ambulatory Visit (HOSPITAL_COMMUNITY): Payer: Medicare HMO | Admitting: Hematology & Oncology

## 2014-10-09 ENCOUNTER — Ambulatory Visit (HOSPITAL_COMMUNITY): Payer: Medicare HMO

## 2014-10-09 ENCOUNTER — Other Ambulatory Visit (HOSPITAL_COMMUNITY): Payer: Medicare HMO

## 2014-10-12 ENCOUNTER — Other Ambulatory Visit (HOSPITAL_COMMUNITY): Payer: Medicare HMO

## 2014-10-12 ENCOUNTER — Encounter (HOSPITAL_COMMUNITY): Payer: Self-pay | Admitting: Hematology & Oncology

## 2014-10-12 ENCOUNTER — Encounter (HOSPITAL_COMMUNITY): Payer: Medicare HMO

## 2014-10-12 ENCOUNTER — Encounter (HOSPITAL_BASED_OUTPATIENT_CLINIC_OR_DEPARTMENT_OTHER): Payer: Medicare HMO | Admitting: Hematology & Oncology

## 2014-10-12 VITALS — BP 128/73 | HR 61 | Temp 98.1°F | Resp 18 | Wt 239.0 lb

## 2014-10-12 DIAGNOSIS — C9110 Chronic lymphocytic leukemia of B-cell type not having achieved remission: Secondary | ICD-10-CM

## 2014-10-12 DIAGNOSIS — D696 Thrombocytopenia, unspecified: Secondary | ICD-10-CM

## 2014-10-12 DIAGNOSIS — Z23 Encounter for immunization: Secondary | ICD-10-CM | POA: Diagnosis not present

## 2014-10-12 DIAGNOSIS — I10 Essential (primary) hypertension: Secondary | ICD-10-CM | POA: Diagnosis not present

## 2014-10-12 MED ORDER — PNEUMOCOCCAL 13-VAL CONJ VACC IM SUSP
0.5000 mL | Freq: Once | INTRAMUSCULAR | Status: AC
  Administered 2014-10-12: 0.5 mL via INTRAMUSCULAR
  Filled 2014-10-12: qty 0.5

## 2014-10-12 NOTE — Progress Notes (Signed)
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   He has read over the materials given to him at the previous appointment about CLL.  The patient and his wife were concerned as to what to look for as the cancer progresses. They also expressed concern about telling their children about the disease. One of their daughters is a Marine scientist. He has experienced waking up in the night feeling hot and clammy, was worried those were night sweats, advised they were not. He's also worried about fatigue and how bad it might get.  MEDICAL HISTORY:  Past Medical History  Diagnosis Date  . Hypertension   . Dysrhythmia     Hx. of Atrial Fibrillation- has resolved  . Pinched nerve in neck     SURGICAL HISTORY: Past Surgical History  Procedure Laterality Date  . Thyroidectomy, partial    . Cataract extraction, bilateral    . Tonsillectomy    . Colonoscopy  07/10/2011    Procedure: COLONOSCOPY;  Surgeon: Dorothyann Peng, MD;  [facility];  Service: Endoscopy;  Laterality: N/A;  10:30 AM    SOCIAL HISTORY: History   Social History  . Marital Status: Married    Spouse Name: N/A  . Number of Children: N/A  . Years of Education: N/A   Occupational History  . Not on file.   Social History Main Topics  . Smoking status: Former Smoker -- 1.00 packs/day for 15 years  . Smokeless tobacco: Not on file  . Alcohol Use: No  . Drug Use: No  . Sexual Activity: Not on file   Other Topics Concern  . Not on file   Social History Narrative  Married for 48 years. 3 children, 6 grandchildren. 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 He smoked one ppd for  15 years and quit in 1972. No ETOH or chewing tobacco  FAMILY HISTORY: Family History  Problem Relation Age of Onset  . Colon cancer Neg Hx    has no family status information on file.   Father died at 26 from an MI. He states they found "cancer" at his autopsy. Mother died at 103 from alzheimers.  One brother and sister who are healthy.  Has children. One daughter is a Marine scientist.  ALLERGIES:  has No Known Allergies.  MEDICATIONS:  Current Outpatient Prescriptions  Medication Sig Dispense Refill  . AMLODIPINE BESYLATE PO Take 10 mg by mouth daily.    Marland Kitchen aspirin 325 MG tablet Take 325 mg by mouth daily.    . carvedilol (COREG) 12.5 MG tablet Take 12.5 mg by mouth 2 (two) times daily with a meal.    . Coenzyme Q10 (COQ10) 100 MG CAPS Take 1 tablet by mouth daily.    . diclofenac (VOLTAREN) 75 MG EC tablet Take 75 mg by mouth 2 (two) times daily.    . fish oil-omega-3 fatty acids 1000 MG capsule Take 1 g by mouth 2 (two) times daily.    . hydrochlorothiazide (HYDRODIURIL) 25 MG tablet Take 25 mg by mouth daily.    . Multiple Vitamin (MULITIVITAMIN WITH MINERALS) TABS Take 1 tablet by mouth daily.    Marland Kitchen  olmesartan (BENICAR) 40 MG tablet Take 40 mg by mouth daily.     No current facility-administered medications for this visit.    Review of Systems  Constitutional: Negative for fever, chills, weight loss and malaise/fatigue.  HENT: Negative for congestion, hearing loss, nosebleeds, sore throat and tinnitus.   Eyes: Negative for blurred vision, double vision, pain and discharge.  Respiratory: Negative for cough, hemoptysis, sputum production, shortness of breath and wheezing.   Cardiovascular: Negative for chest pain, palpitations, claudication, leg swelling and PND.  Gastrointestinal: Negative for heartburn, nausea, vomiting, abdominal pain, diarrhea, constipation, blood in stool and melena.  Genitourinary: Negative for dysuria, urgency, frequency and hematuria.  Musculoskeletal: Negative  for myalgias, joint pain and falls.  Skin: Negative for itching and rash.  Neurological: Negative for dizziness, tingling, tremors, sensory change, speech change, focal weakness, seizures, loss of consciousness, weakness and headaches.  Endo/Heme/Allergies: Does not bruise/bleed easily.  Psychiatric/Behavioral: Negative for depression, suicidal ideas, memory loss and substance abuse. The patient is not nervous/anxious and does not have insomnia.    14 point ROS was done and is otherwise as detailed above or in HPI   PHYSICAL EXAMINATION:  ECOG PERFORMANCE STATUS: 0 - Asymptomatic  There were no vitals filed for this visit. There were no vitals filed for this visit.   Physical Exam  Constitutional: He is oriented to person, place, and time and well-developed, well-nourished, and in no distress.  HENT:  Head: Normocephalic and atraumatic.  Nose: Nose normal.  Mouth/Throat: Oropharynx is clear and moist. No oropharyngeal exudate.  Eyes: Conjunctivae and EOM are normal. Pupils are equal, round, and reactive to light. Right eye exhibits no discharge. Left eye exhibits no discharge. No scleral icterus.  Neck: Normal range of motion. Neck supple. No tracheal deviation present. No thyromegaly present.  Cardiovascular: Normal rate, regular rhythm and normal heart sounds.  Exam reveals no gallop and no friction rub.   No murmur heard. Pulmonary/Chest: Effort normal and breath sounds normal. He has no wheezes. He has no rales.  Abdominal: Soft. Bowel sounds are normal. He exhibits no distension and no mass. There is no tenderness. There is no rebound and no guarding.  Musculoskeletal: Normal range of motion. He exhibits no edema.  Lymphadenopathy:    He has cervical adenopathy.       Left cervical: Superficial cervical and posterior cervical adenopathy present.       Left: Supraclavicular adenopathy present.  Largest is 2 cm in diameter and mobile. Unchanged since last visit.  Neurological:  He is alert and oriented to person, place, and time. He has normal reflexes. No cranial nerve deficit. Gait normal. Coordination normal.  Skin: Skin is warm and dry. No rash noted.  Psychiatric: Mood, memory, affect and judgment normal.  Nursing note and vitals reviewed.  LABORATORY DATA:  I have reviewed the data as listed Lab Results  Component Value Date   WBC 10.5 09/07/2014   HGB 16.0 09/07/2014   HCT 45.0 09/07/2014   MCV 90.9 09/07/2014   PLT 113* 09/07/2014     Chemistry      Component Value Date/Time   NA 138 09/07/2014 1540   K 3.6 09/07/2014 1540   CL 104 09/07/2014 1540   CO2 24 09/07/2014 1540   BUN 23 09/07/2014 1540   CREATININE 1.10 09/07/2014 1540      Component Value Date/Time   CALCIUM 9.4 09/07/2014 1540   ALKPHOS 44 09/07/2014 1540   AST 29 09/07/2014 1540   ALT 22 09/07/2014  1540   BILITOT 0.8 09/07/2014 1540      ASSESSMENT & PLAN:   CLL Rai staging, Stage I Mild thrombocytopenia with last platelet count of 113K   We spent time today in discussion reviewing CLL and indications for treatment.  We discussed laboratory changes that would indicate the need to treat and subjective reasons to treat such as night sweats, fatigue.  We discussed his IgG levels and I advised him there is currently no indication for IVIG. We reviewed indications for IVIG therapy.  We reviewed his response to vaccinations, he will receive prevnar today and I advised him the flu vaccination is recommended yearly.  Follow up in 2 months with blood work and physical exam.  I discussed with the patient and his wife how they could talk to their children regarding his diagnosis. He is currently asymptomatic and doing well.   Orders Placed This Encounter  Procedures  . CBC with Differential    Standing Status: Future     Number of Occurrences:      Standing Expiration Date: 10/12/2015  . Comprehensive metabolic panel    Standing Status: Future     Number of Occurrences:       Standing Expiration Date: 10/12/2015  . Lactate dehydrogenase    Standing Status: Future     Number of Occurrences:      Standing Expiration Date: 10/12/2015    All questions were answered. The patient knows to call the clinic with any problems, questions or concerns. I have advised them to write down any questions to bring with them to their next follow-up.  They are in agreement with the plan as detailed. This note was electronically signed.    This document serves as a record of services personally performed by Ancil Linsey, MD. It was created on her behalf by Arlyce Harman, a trained medical scribe. The creation of this record is based on the scribe's personal observations and the provider's statements to them. This document has been checked and approved by the attending provider.  I have reviewed the above documentation for accuracy and completeness, and I agree with the above. Molli Hazard, MD

## 2014-10-12 NOTE — Patient Instructions (Signed)
..  [facility] Discharge Instructions  RECOMMENDATIONS MADE BY THE CONSULTANT AND ANY TEST RESULTS WILL BE SENT TO YOUR REFERRING PHYSICIAN.  Prevnar injection today  Return in 2 months for labs and to see the Dr.  Lujean Rave you for choosing [place] [facility] to provide your oncology and hematology care.  To afford each patient quality time with our provider, please arrive at least 15 minutes before your scheduled appointment time.    You need to re-schedule your appointment should you arrive 10 or more minutes late.  We strive to give you quality time with our providers, and arriving late affects you and other patients whose appointments are after yours.  Also, if you no show three or more times for appointments you may be dismissed from the clinic at the providers discretion.     Again, thank you for choosing [facility].  Our hope is that these requests will decrease the amount of time that you wait before being seen by our physicians.       _____________________________________________________________  Should you have questions after your visit to [facility], please contact our office at [phone] between the hours of 8:30 a.m. and 4:30 p.m.  Voicemails left after 4:30 p.m. will not be returned until the following business day.  For prescription refill requests, have your pharmacy contact our office.

## 2014-10-12 NOTE — Progress Notes (Signed)
Please see doctors encounter for more information 

## 2014-10-18 ENCOUNTER — Encounter (HOSPITAL_COMMUNITY): Payer: Self-pay | Admitting: Hematology & Oncology

## 2014-10-18 NOTE — Progress Notes (Signed)
[facility] NOTE  Patient Care Team: Delphina Cahill, MD as PCP - General (Internal Medicine)  CHIEF COMPLAINTS/PURPOSE OF CONSULTATION:  Thrombocytopenia  Chronic Lymphocytic Leukemia.  HISTORY OF PRESENTING ILLNESS:  Andrew [name] 74 y.o. male is here because of low platelets.  He denies any specific symptoms related to thrombocytopenia such as bleeding of bruising.  He is active and denies any significant fatigue or change in weight or appetite. He has no B symptoms.  He follows with Dr. Nevada Crane for his primary care needs. 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 Other blood counts are preserved. Labs from 06/2012 show a platelet count of 128K. Labs from 08/06/2014 show a platelet count of 103K. Again all other counts are preserved. Labs on 3/10 do include a differential which slows low granulocytes at 23%, increased monocytes at 13% and atypical lymphocytes at 24%.   PSA was also noted to be mildly elevated at 4.09..  He is here today for additional evaluation.   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   MEDICAL HISTORY:  Past Medical History  Diagnosis Date  . Hypertension   . Dysrhythmia     Hx. of Atrial Fibrillation- has resolved  . Pinched nerve in neck     SURGICAL HISTORY: Past Surgical History  Procedure Laterality Date  . Thyroidectomy, partial    . Cataract extraction, bilateral    . Tonsillectomy    . Colonoscopy  07/10/2011    Procedure: COLONOSCOPY;  Surgeon: Dorothyann Peng, MD;  [facility];  Service: Endoscopy;  Laterality: N/A;  10:30 AM    SOCIAL HISTORY: History   Social History  . Marital Status: Married    Spouse Name: N/A  . Number of Children: N/A  . Years of Education: N/A   Occupational History  . Not on file.   Social History Main Topics  . Smoking status: Former Smoker -- 1.00 packs/day  for 15 years  . Smokeless tobacco: Not on file  . Alcohol Use: No  . Drug Use: No  . Sexual Activity: Not on file   Other Topics Concern  . Not on file   Social History Narrative  Married for 48 years. 3 children, 6 grandchildren. 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 He smoked one ppd for 15 years and quit in 1972. No ETOH or chewing tobacco  FAMILY HISTORY: Family History  Problem Relation Age of Onset  . Colon cancer Neg Hx    has no family status information on file.   Father died at 63 from an MI. He states they found "cancer" at his autopsy. Mother died at 29 from alzheimers.  One brother and sister who are healthy.  ALLERGIES:  has No Known Allergies.  MEDICATIONS:  Current Outpatient Prescriptions  Medication Sig Dispense Refill  . AMLODIPINE BESYLATE PO Take 10 mg by mouth daily.    Marland Kitchen aspirin 325 MG tablet Take 325 mg by mouth daily.    . carvedilol (COREG) 12.5 MG tablet Take 12.5 mg by mouth 2 (two) times daily with a meal.    . Coenzyme Q10 (COQ10) 100 MG CAPS Take 1 tablet by mouth daily.    . fish oil-omega-3 fatty acids 1000 MG capsule Take 1 g by mouth 2 (two) times daily.    Marland Kitchen  hydrochlorothiazide (HYDRODIURIL) 25 MG tablet Take 25 mg by mouth daily.    . Multiple Vitamin (MULITIVITAMIN WITH MINERALS) TABS Take 1 tablet by mouth daily.    Marland Kitchen olmesartan (BENICAR) 40 MG tablet Take 40 mg by mouth daily.    Jabier Gauss 40-10-25 MG TABS      No current facility-administered medications for this visit.    Review of Systems  Constitutional: Negative for fever, chills, weight loss and malaise/fatigue.  HENT: Negative for congestion, hearing loss, nosebleeds, sore throat and tinnitus.   Eyes: Negative for blurred vision, double vision, pain and discharge.  Respiratory: Negative for cough, hemoptysis, sputum production, shortness of breath and wheezing.   Cardiovascular: Negative for chest pain, palpitations,  claudication, leg swelling and PND.  Gastrointestinal: Negative for heartburn, nausea, vomiting, abdominal pain, diarrhea, constipation, blood in stool and melena.  Genitourinary: Negative for dysuria, urgency, frequency and hematuria.  Musculoskeletal: Negative for myalgias, joint pain and falls.  Skin: Negative for itching and rash.  Neurological: Negative for dizziness, tingling, tremors, sensory change, speech change, focal weakness, seizures, loss of consciousness, weakness and headaches.  Endo/Heme/Allergies: Does not bruise/bleed easily.  Psychiatric/Behavioral: Negative for depression, suicidal ideas, memory loss and substance abuse. The patient is not nervous/anxious and does not have insomnia.    14 point ROS was done and is otherwise as detailed above or in HPI   PHYSICAL EXAMINATION: ECOG PERFORMANCE STATUS: 0 - Asymptomatic  Filed Vitals:   09/17/14 0826  BP: 136/80  Pulse: 55  Temp: 98.4 F (36.9 C)  Resp: 18   Filed Weights   09/17/14 0826  Weight: 239 lb 6.4 oz (108.591 kg)     Physical Exam  Constitutional: He is oriented to person, place, and time and well-developed, well-nourished, and in no distress.  HENT:  Head: Normocephalic and atraumatic.  Nose: Nose normal.  Mouth/Throat: Oropharynx is clear and moist. No oropharyngeal exudate.  Eyes: Conjunctivae and EOM are normal. Pupils are equal, round, and reactive to light. Right eye exhibits no discharge. Left eye exhibits no discharge. No scleral icterus.  Neck: Normal range of motion. Neck supple. No tracheal deviation present. No thyromegaly present.  Cardiovascular: Normal rate, regular rhythm and normal heart sounds.  Exam reveals no gallop and no friction rub.   No murmur heard. Pulmonary/Chest: Effort normal and breath sounds normal. He has no wheezes. He has no rales.  Abdominal: Soft. Bowel sounds are normal. He exhibits no distension and no mass. There is no tenderness. There is no rebound and no  guarding.  Musculoskeletal: Normal range of motion. He exhibits no edema.  Lymphadenopathy:    He has cervical adenopathy.       Left cervical: Superficial cervical and posterior cervical adenopathy present.       Left: Supraclavicular adenopathy present.  Largest is 2 cm in diameter and mobile  Neurological: He is alert and oriented to person, place, and time. He has normal reflexes. No cranial nerve deficit. Gait normal. Coordination normal.  Skin: Skin is warm and dry. No rash noted.  Psychiatric: Mood, memory, affect and judgment normal.  Nursing note and vitals reviewed.   RADIOLOGY:  I HAVE PERSONALLY REVIEWED THE IMAGES LISTED BELOW AND AGREE WITH THE RESULTS  CLINICAL DATA: Cervical lymphadenopathy. Thrombocytopenia. Ex-smoker. Surgery on neck 8 years ago.  EXAM: CT CHEST WITH CONTRAST  TECHNIQUE: Multidetector CT imaging of the chest was performed during intravenous contrast administration.  CONTRAST: 59m OMNIPAQUE IOHEXOL 300 MG/ML SOLN  COMPARISON: Neck CT of same  date IMPRESSION: 1. Extensive thoracic and upper abdominal adenopathy, most consistent with a lymphoproliferative process such as lymphoma or leukemia. 2. Atherosclerosis, including within the coronary arteries. 3. Small hiatal hernia. 4. Nonspecific 6 mm lingular nodule.   Electronically Signed  By: Abigail Miyamoto M.D.  On: 09/11/2014 08:28  LABORATORY DATA:  I have reviewed the data as listed Lab Results  Component Value Date   WBC 10.5 09/07/2014   HGB 16.0 09/07/2014   HCT 45.0 09/07/2014   MCV 90.9 09/07/2014   PLT 113* 09/07/2014     Chemistry      Component Value Date/Time   NA 138 09/07/2014 1540   K 3.6 09/07/2014 1540   CL 104 09/07/2014 1540   CO2 24 09/07/2014 1540   BUN 23 09/07/2014 1540   CREATININE 1.10 09/07/2014 1540      Component Value Date/Time   CALCIUM 9.4 09/07/2014 1540   ALKPHOS 44 09/07/2014 1540   AST 29 09/07/2014 1540   ALT 22 09/07/2014  1540   BILITOT 0.8 09/07/2014 1540      PATHOLOGY: INTERPRETATION Interpretation Peripheral Blood Flow Cytometry - FINDINGS CONSISTENT WITH CHRONIC LYMPHOCYTIC LEUKEMIA. 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 We reviewed his laboratory studies specifically his flow cytometry. We reviewed his CT imaging. I discussed the general nature of CLL. I discussed indications for treatment such as worsening blood counts, anemia, thrombocytopenia, rapid doubling time of the white count, symptoms such as weight loss, drenching night sweats, or excessive fatigue affecting ADLs. I discussed her therapies in CLL. I advised the patient and his wife that the vast majority of patients with CLL do quite well. I also discussed with them that newer therapies are drastically changing the treatment environment and potentially prognosis of this disease. There were given reading information today and will follow-up in 3 weeks to answer any additional questions. We will arrange for the patient to have the [place] vaccination at follow-up. We will arrange for additional laboratory studies including quantitative immunoglobulins and zap 70 testing. I advised him that bone marrow biopsy is not currently needed.  Orders Placed This Encounter  Procedures  . Zap-70    Standing Status: Future     Number of Occurrences: 1     Standing Expiration Date: 09/17/2015  . IgG, IgA, IgM    Standing Status: Future     Number of Occurrences: 1     Standing Expiration Date: 09/17/2015  . Immunofixation electrophoresis    Standing Status:  Future     Number of Occurrences: 1     Standing Expiration Date: 09/17/2015  . Protein electrophoresis, serum    Standing Status: Future     Number of Occurrences: 1     Standing Expiration Date: 09/17/2015    All questions were answered. The patient knows to call the clinic with any problems, questions or concerns. I have advised them to write down any questions to bring with them to their next follow-up.  They are in agreement with the plan as detailed. This note was electronically signed.    Molli Hazard, MD  10/18/2014 10:50 AM

## 2014-12-07 ENCOUNTER — Encounter (HOSPITAL_COMMUNITY): Payer: Self-pay | Admitting: Hematology & Oncology

## 2014-12-07 ENCOUNTER — Encounter (HOSPITAL_BASED_OUTPATIENT_CLINIC_OR_DEPARTMENT_OTHER): Payer: Medicare HMO

## 2014-12-07 ENCOUNTER — Encounter (HOSPITAL_COMMUNITY): Payer: Medicare HMO | Attending: Hematology & Oncology | Admitting: Hematology & Oncology

## 2014-12-07 VITALS — BP 114/68 | HR 62 | Temp 98.0°F | Resp 18 | Wt 233.0 lb

## 2014-12-07 DIAGNOSIS — D696 Thrombocytopenia, unspecified: Secondary | ICD-10-CM | POA: Diagnosis not present

## 2014-12-07 DIAGNOSIS — C9110 Chronic lymphocytic leukemia of B-cell type not having achieved remission: Secondary | ICD-10-CM | POA: Insufficient documentation

## 2014-12-07 DIAGNOSIS — R591 Generalized enlarged lymph nodes: Secondary | ICD-10-CM

## 2014-12-07 LAB — COMPREHENSIVE METABOLIC PANEL
ALBUMIN: 4.5 g/dL (ref 3.5–5.0)
ALT: 24 U/L (ref 17–63)
AST: 28 U/L (ref 15–41)
Alkaline Phosphatase: 43 U/L (ref 38–126)
Anion gap: 8 (ref 5–15)
BILIRUBIN TOTAL: 1.2 mg/dL (ref 0.3–1.2)
BUN: 27 mg/dL — ABNORMAL HIGH (ref 6–20)
CO2: 27 mmol/L (ref 22–32)
CREATININE: 1.4 mg/dL — AB (ref 0.61–1.24)
Calcium: 9.9 mg/dL (ref 8.9–10.3)
Chloride: 105 mmol/L (ref 101–111)
GFR calc Af Amer: 56 mL/min — ABNORMAL LOW (ref >60)
GFR, EST NON AFRICAN AMERICAN: 48 mL/min — AB (ref >60)
Glucose, Bld: 97 mg/dL (ref 65–99)
POTASSIUM: 3.7 mmol/L (ref 3.5–5.1)
SODIUM: 140 mmol/L (ref 135–145)
TOTAL PROTEIN: 6.9 g/dL (ref 6.5–8.1)

## 2014-12-07 LAB — LACTATE DEHYDROGENASE: LDH: 150 U/L (ref 98–192)

## 2014-12-07 LAB — CBC WITH DIFFERENTIAL/PLATELET
BASOS PCT: 0 % (ref 0–1)
Basophils Absolute: 0 10*3/uL (ref 0.0–0.1)
EOS PCT: 2 % (ref 0–5)
Eosinophils Absolute: 0.3 10*3/uL (ref 0.0–0.7)
HCT: 45 % (ref 39.0–52.0)
HEMOGLOBIN: 16.1 g/dL (ref 13.0–17.0)
LYMPHS ABS: 9.3 10*3/uL — AB (ref 0.7–4.0)
Lymphocytes Relative: 69 % — ABNORMAL HIGH (ref 12–46)
MCH: 32.2 pg (ref 26.0–34.0)
MCHC: 35.8 g/dL (ref 30.0–36.0)
MCV: 90 fL (ref 78.0–100.0)
Monocytes Absolute: 0.7 10*3/uL (ref 0.1–1.0)
Monocytes Relative: 5 % (ref 3–12)
NEUTROS ABS: 3.1 10*3/uL (ref 1.7–7.7)
Neutrophils Relative %: 23 % — ABNORMAL LOW (ref 43–77)
PLATELETS: 94 10*3/uL — AB (ref 150–400)
RBC: 5 MIL/uL (ref 4.22–5.81)
RDW: 12.5 % (ref 11.5–15.5)
WBC: 13.4 10*3/uL — AB (ref 4.0–10.5)

## 2014-12-07 NOTE — Progress Notes (Signed)
[place] [facility] Progress Note  Patient Care Team: Delphina Cahill, MD as PCP - General (Internal Medicine)  CHIEF COMPLAINTS:  Thrombocytopenia  Chronic Lymphocytic Leukemia. Lymphadenopathy  HISTORY OF PRESENTING ILLNESS:  Andrew [name] 74 y.o. male is here for additional f/u of his CLL.  He denies any specific symptoms related to thrombocytopenia such as bleeding of bruising.  He is active and denies any significant fatigue or change in weight or appetite. He has no B symptoms.  He is here today for additional evaluation. He is doing fine and here alone today.He has been going to baseball practice and games with his grandsons over the summer.   He does not have any new pain or lumps and bumps. He denies drenching nightsweats. He is eating well and his appetite has been great.   He was curious if CLL makes him more susceptible to other cancers. He states that the information he was given at his previous visit was very helpful.  He has not seen anyone about his prostate but follows with Dr. Nevada Crane routinely.  MEDICAL HISTORY:  Past Medical History  Diagnosis Date  . Hypertension   . Dysrhythmia     Hx. of Atrial Fibrillation- has resolved  . Pinched nerve in neck     SURGICAL HISTORY: Past Surgical History  Procedure Laterality Date  . Thyroidectomy, partial    . Cataract extraction, bilateral    . Tonsillectomy    . Colonoscopy  07/10/2011    Procedure: COLONOSCOPY;  Surgeon: Dorothyann Peng, MD;  [facility];  Service: Endoscopy;  Laterality: N/A;  10:30 AM    SOCIAL HISTORY: History   Social History  . Marital Status: Married    Spouse Name: N/A  . Number of Children: N/A  . Years of Education: N/A   Occupational History  . Not on file.   Social History Main Topics  . Smoking status: Former Smoker -- 1.00 packs/day for 15 years  . Smokeless tobacco: Not on file  . Alcohol Use: No  . Drug Use: No  . Sexual Activity: Not on  file   Other Topics Concern  . Not on file   Social History Narrative  Married for 48 years. 3 children, 6 grandchildren. 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 He smoked one ppd for 15 years and quit in 1972. No ETOH or chewing tobacco  FAMILY HISTORY: Family History  Problem Relation Age of Onset  . Colon cancer Neg Hx    has no family status information on file.   Father died at 43 from an MI. He states they found "cancer" at his autopsy. Mother died at 48 from alzheimers.  One brother and sister who are healthy.  ALLERGIES:  has No Known Allergies.  MEDICATIONS:  Current Outpatient Prescriptions  Medication Sig Dispense Refill  . AMLODIPINE BESYLATE PO Take 10 mg by mouth daily.    Marland Kitchen aspirin 325 MG tablet Take 325 mg by mouth daily.    . carvedilol (COREG) 12.5 MG tablet Take 12.5 mg by mouth 2 (two) times daily with a meal.    . Coenzyme Q10 (COQ10) 100 MG CAPS Take 1 tablet by mouth daily.    . fish oil-omega-3 fatty acids 1000 MG capsule Take 1 g by mouth 2 (two) times daily.    . hydrochlorothiazide (HYDRODIURIL) 25 MG tablet Take 25 mg by mouth daily.    . Multiple  Vitamin (MULITIVITAMIN WITH MINERALS) TABS Take 1 tablet by mouth daily.    Marland Kitchen olmesartan (BENICAR) 40 MG tablet Take 40 mg by mouth daily.     No current facility-administered medications for this visit.    Review of Systems  Constitutional: Negative for fever, chills, weight loss and malaise/fatigue.  HENT: Negative for congestion, hearing loss, nosebleeds, sore throat and tinnitus.   Eyes: Negative for blurred vision, double vision, pain and discharge.  Respiratory: Negative for cough, hemoptysis, sputum production, shortness of breath and wheezing.   Cardiovascular: Negative for chest pain, palpitations, claudication, leg swelling and PND.  Gastrointestinal: Negative for heartburn, nausea, vomiting, abdominal pain, diarrhea, constipation, blood in stool and  melena.  Genitourinary: Negative for dysuria, urgency, frequency and hematuria.  Musculoskeletal: Negative for myalgias, joint pain and falls.  Skin: Negative for itching and rash.  Neurological: Negative for dizziness, tingling, tremors, sensory change, speech change, focal weakness, seizures, loss of consciousness, weakness and headaches.  Endo/Heme/Allergies: Does not bruise/bleed easily.  Psychiatric/Behavioral: Negative for depression, suicidal ideas, memory loss and substance abuse. The patient is not nervous/anxious and does not have insomnia.    14 point ROS was done and is otherwise as detailed above or in HPI   PHYSICAL EXAMINATION: ECOG PERFORMANCE STATUS: 0 - Asymptomatic  Filed Vitals:   12/07/14 1107  BP: 114/68  Pulse: 62  Temp: 98 F (36.7 C)  Resp: 18   Filed Weights   12/07/14 1107  Weight: 233 lb (105.688 kg)    Physical Exam  Constitutional: He is oriented to person, place, and time and well-developed, well-nourished, and in no distress.  HENT:  Head: Normocephalic and atraumatic.  Nose: Nose normal.  Mouth/Throat: Oropharynx is clear and moist. No oropharyngeal exudate.  Eyes: Conjunctivae and EOM are normal. Pupils are equal, round, and reactive to light. Right eye exhibits no discharge. Left eye exhibits no discharge. No scleral icterus.  Neck: Normal range of motion. Neck supple. No tracheal deviation present. No thyromegaly present.  Cardiovascular: Normal rate, regular rhythm and normal heart sounds.  Exam reveals no gallop and no friction rub.   No murmur heard. Pulmonary/Chest: Effort normal and breath sounds normal. He has no wheezes. He has no rales.  Abdominal: Soft. Bowel sounds are normal. He exhibits no distension and no mass. There is no tenderness. There is no rebound and no guarding.  Musculoskeletal: Normal range of motion. He exhibits no edema.  Lymphadenopathy:    He has cervical adenopathy.       Left cervical: Superficial cervical  and posterior cervical adenopathy present.       Left: Supraclavicular adenopathy present.  Largest is the left supraclavicular, it is freely mobile  Neurological: He is alert and oriented to person, place, and time. He has normal reflexes. No cranial nerve deficit. Gait normal. Coordination normal.  Skin: Skin is warm and dry. No rash noted.  Psychiatric: Mood, memory, affect and judgment normal.  Nursing note and vitals reviewed.   RADIOLOGY:  I HAVE PERSONALLY REVIEWED THE IMAGES LISTED BELOW AND AGREE WITH THE RESULTS  CLINICAL DATA: Cervical lymphadenopathy. Thrombocytopenia. Ex-smoker. Surgery on neck 8 years ago.  EXAM: CT CHEST WITH CONTRAST  TECHNIQUE: Multidetector CT imaging of the chest was performed during intravenous contrast administration.  CONTRAST: 61mL OMNIPAQUE IOHEXOL 300 MG/ML SOLN  COMPARISON: Neck CT of same date IMPRESSION: 1. Extensive thoracic and upper abdominal adenopathy, most consistent with a lymphoproliferative process such as lymphoma or leukemia. 2. Atherosclerosis, including within the coronary arteries.  3. Small hiatal hernia. 4. Nonspecific 6 mm lingular nodule.   Electronically Signed  By: Abigail Miyamoto M.D.  On: 09/11/2014 08:28  LABORATORY DATA:  I have reviewed the data as listed Lab Results  Component Value Date   WBC 13.4* 12/07/2014   HGB 16.1 12/07/2014   HCT 45.0 12/07/2014   MCV 90.0 12/07/2014   PLT 94* 12/07/2014     Chemistry      Component Value Date/Time   NA 140 12/07/2014 1152   K 3.7 12/07/2014 1152   CL 105 12/07/2014 1152   CO2 27 12/07/2014 1152   BUN 27* 12/07/2014 1152   CREATININE 1.40* 12/07/2014 1152      Component Value Date/Time   CALCIUM 9.9 12/07/2014 1152   ALKPHOS 43 12/07/2014 1152   AST 28 12/07/2014 1152   ALT 24 12/07/2014 1152   BILITOT 1.2 12/07/2014 1152      PATHOLOGY: INTERPRETATION Interpretation Peripheral Blood Flow Cytometry - FINDINGS CONSISTENT WITH  CHRONIC LYMPHOCYTIC LEUKEMIA. 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 Adenopathy is stable. Performance status is good. Platelet count is stable to slightly lower. I recommended ongoing close observation. We will follow-up again in 3 months. I have advised the patient if he develops any symptoms of concern such as weight loss, night sweats, bleeding or bruising, he is to let us know. We again reviewed indications to treat CLL.  We will schedule for a routine follow up in 3 months.   Orders Placed This Encounter  Procedures  . CBC with Differential    Standing Status: Future     Number of Occurrences:      Standing Expiration Date: 12/07/2015  . Comprehensive metabolic panel    Standing Status: Future     Number of Occurrences:      Standing Expiration Date: 12/07/2015  . Lactate dehydrogenase    Standing Status: Future     Number of Occurrences:      Standing Expiration Date: 12/07/2015    All questions were answered. The patient knows to call the clinic with any problems, questions or concerns. I have advised them to write down any questions to bring with them to their next follow-up.  They are in agreement with the plan as detailed.  This note was electronically signed.   This document serves as a record of services personally performed by Ancil Linsey, MD. It was created on her behalf by Arlyce Harman, a trained medical scribe. The creation of this record is based on the scribe's personal observations and the provider's statements to them. This document has been checked and approved by the attending provider.  I have reviewed the above documentation for accuracy and completeness, and I agree with the above.     Molli Hazard, MD

## 2014-12-07 NOTE — Progress Notes (Signed)
RN DREW LABS

## 2014-12-07 NOTE — Patient Instructions (Addendum)
[place] at [facility] Discharge Instructions  RECOMMENDATIONS MADE BY THE CONSULTANT AND ANY TEST RESULTS WILL BE SENT TO YOUR REFERRING PHYSICIAN.  Exam and discussion today with Dr. Whitney Muse. Lab work today. Return in 3 months for office visit and lab work.  Thank you for choosing [place] at [facility] to provide your oncology and hematology care.  To afford each patient quality time with our provider, please arrive at least 15 minutes before your scheduled appointment time.    You need to re-schedule your appointment should you arrive 10 or more minutes late.  We strive to give you quality time with our providers, and arriving late affects you and other patients whose appointments are after yours.  Also, if you no show three or more times for appointments you may be dismissed from the clinic at the providers discretion.     Again, thank you for choosing [facility].  Our hope is that these requests will decrease the amount of time that you wait before being seen by our physicians.       _____________________________________________________________  Should you have questions after your visit to [facility], please contact our office at [phone] between the hours of 8:30 a.m. and 4:30 p.m.  Voicemails left after 4:30 p.m. will not be returned until the following business day.  For prescription refill requests, have your pharmacy contact our office.

## 2014-12-11 ENCOUNTER — Other Ambulatory Visit (HOSPITAL_COMMUNITY): Payer: Medicare HMO

## 2014-12-11 ENCOUNTER — Ambulatory Visit (HOSPITAL_COMMUNITY): Payer: Medicare HMO | Admitting: Hematology & Oncology

## 2014-12-14 ENCOUNTER — Telehealth (HOSPITAL_COMMUNITY): Payer: Self-pay | Admitting: Emergency Medicine

## 2014-12-14 DIAGNOSIS — C9110 Chronic lymphocytic leukemia of B-cell type not having achieved remission: Secondary | ICD-10-CM

## 2014-12-14 NOTE — Telephone Encounter (Signed)
-----   Message from Patrici Ranks, MD sent at 12/11/2014  4:52 PM EDT ----- Wbc count at 13.4, platelet count slightly lower at 94K.  Recheck CBC in 6 weeks.  I will notify him of results then. Call with any concerns prior to f/u. Dr.P

## 2014-12-14 NOTE — Telephone Encounter (Signed)
Notified pt of lab work and notified pt of new appt for CBC

## 2014-12-14 NOTE — Telephone Encounter (Signed)
Left pt a message to call us about his lab work

## 2014-12-31 ENCOUNTER — Encounter (HOSPITAL_COMMUNITY): Payer: Self-pay | Admitting: Hematology & Oncology

## 2015-01-25 ENCOUNTER — Other Ambulatory Visit (HOSPITAL_COMMUNITY): Payer: Medicare HMO

## 2015-01-28 ENCOUNTER — Encounter (HOSPITAL_COMMUNITY): Payer: Medicare HMO | Attending: Oncology

## 2015-01-28 DIAGNOSIS — D696 Thrombocytopenia, unspecified: Secondary | ICD-10-CM | POA: Diagnosis not present

## 2015-01-28 DIAGNOSIS — R591 Generalized enlarged lymph nodes: Secondary | ICD-10-CM | POA: Insufficient documentation

## 2015-01-28 DIAGNOSIS — C9110 Chronic lymphocytic leukemia of B-cell type not having achieved remission: Secondary | ICD-10-CM | POA: Diagnosis not present

## 2015-01-28 LAB — CBC
HCT: 42.8 % (ref 39.0–52.0)
Hemoglobin: 15.4 g/dL (ref 13.0–17.0)
MCH: 32.8 pg (ref 26.0–34.0)
MCHC: 36 g/dL (ref 30.0–36.0)
MCV: 91.1 fL (ref 78.0–100.0)
PLATELETS: 100 10*3/uL — AB (ref 150–400)
RBC: 4.7 MIL/uL (ref 4.22–5.81)
RDW: 12.6 % (ref 11.5–15.5)
WBC: 13.8 10*3/uL — ABNORMAL HIGH (ref 4.0–10.5)

## 2015-01-28 NOTE — Progress Notes (Signed)
LABS DRAWN

## 2015-01-29 ENCOUNTER — Other Ambulatory Visit (HOSPITAL_COMMUNITY): Payer: Medicare HMO

## 2015-03-02 ENCOUNTER — Encounter (HOSPITAL_COMMUNITY): Payer: Self-pay | Admitting: Hematology & Oncology

## 2015-03-09 ENCOUNTER — Encounter (HOSPITAL_COMMUNITY): Payer: Medicare HMO

## 2015-03-09 ENCOUNTER — Encounter (HOSPITAL_COMMUNITY): Payer: Self-pay | Admitting: Hematology & Oncology

## 2015-03-09 ENCOUNTER — Encounter (HOSPITAL_COMMUNITY): Payer: Medicare HMO | Attending: Hematology & Oncology | Admitting: Hematology & Oncology

## 2015-03-09 VITALS — BP 103/68 | HR 65 | Temp 98.0°F | Resp 18 | Wt 233.0 lb

## 2015-03-09 DIAGNOSIS — D801 Nonfamilial hypogammaglobulinemia: Secondary | ICD-10-CM | POA: Diagnosis not present

## 2015-03-09 DIAGNOSIS — Z23 Encounter for immunization: Secondary | ICD-10-CM | POA: Diagnosis not present

## 2015-03-09 DIAGNOSIS — D696 Thrombocytopenia, unspecified: Secondary | ICD-10-CM | POA: Diagnosis not present

## 2015-03-09 DIAGNOSIS — C9110 Chronic lymphocytic leukemia of B-cell type not having achieved remission: Secondary | ICD-10-CM

## 2015-03-09 LAB — CBC WITH DIFFERENTIAL/PLATELET
BASOS PCT: 0 %
Basophils Absolute: 0 10*3/uL (ref 0.0–0.1)
EOS PCT: 1 %
Eosinophils Absolute: 0.2 10*3/uL (ref 0.0–0.7)
HCT: 44.6 % (ref 39.0–52.0)
HEMOGLOBIN: 16 g/dL (ref 13.0–17.0)
Lymphocytes Relative: 77 %
Lymphs Abs: 12.1 10*3/uL — ABNORMAL HIGH (ref 0.7–4.0)
MCH: 32.7 pg (ref 26.0–34.0)
MCHC: 35.9 g/dL (ref 30.0–36.0)
MCV: 91.2 fL (ref 78.0–100.0)
MONO ABS: 0.3 10*3/uL (ref 0.1–1.0)
MONOS PCT: 2 %
Neutro Abs: 3 10*3/uL (ref 1.7–7.7)
Neutrophils Relative %: 19 %
PLATELETS: 105 10*3/uL — AB (ref 150–400)
RBC: 4.89 MIL/uL (ref 4.22–5.81)
RDW: 12.8 % (ref 11.5–15.5)
WBC: 15.7 10*3/uL — ABNORMAL HIGH (ref 4.0–10.5)

## 2015-03-09 LAB — LACTATE DEHYDROGENASE: LDH: 141 U/L (ref 98–192)

## 2015-03-09 LAB — COMPREHENSIVE METABOLIC PANEL
ALK PHOS: 41 U/L (ref 38–126)
ALT: 20 U/L (ref 17–63)
ANION GAP: 6 (ref 5–15)
AST: 28 U/L (ref 15–41)
Albumin: 4.2 g/dL (ref 3.5–5.0)
BUN: 24 mg/dL — ABNORMAL HIGH (ref 6–20)
CALCIUM: 8.9 mg/dL (ref 8.9–10.3)
CO2: 25 mmol/L (ref 22–32)
Chloride: 108 mmol/L (ref 101–111)
Creatinine, Ser: 1.19 mg/dL (ref 0.61–1.24)
GFR calc non Af Amer: 58 mL/min — ABNORMAL LOW (ref >60)
Glucose, Bld: 108 mg/dL — ABNORMAL HIGH (ref 65–99)
Potassium: 3.9 mmol/L (ref 3.5–5.1)
Sodium: 139 mmol/L (ref 135–145)
Total Bilirubin: 1 mg/dL (ref 0.3–1.2)
Total Protein: 6.4 g/dL — ABNORMAL LOW (ref 6.5–8.1)

## 2015-03-09 MED ORDER — INFLUENZA VAC SPLIT QUAD 0.5 ML IM SUSY
0.5000 mL | PREFILLED_SYRINGE | Freq: Once | INTRAMUSCULAR | Status: AC
  Administered 2015-03-09: 0.5 mL via INTRAMUSCULAR

## 2015-03-09 MED ORDER — INFLUENZA VAC SPLIT QUAD 0.5 ML IM SUSY
PREFILLED_SYRINGE | INTRAMUSCULAR | Status: AC
  Filled 2015-03-09: qty 0.5

## 2015-03-09 NOTE — Patient Instructions (Addendum)
[place] at [facility] Discharge Instructions  RECOMMENDATIONS MADE BY THE CONSULTANT AND ANY TEST RESULTS WILL BE SENT TO YOUR REFERRING PHYSICIAN.  Exam per Dr.Penland. Flu vaccine today. Return to clinic in 3 months for follow up.  Thank you for choosing [place] at [facility] to provide your oncology and hematology care.  To afford each patient quality time with our provider, please arrive at least 15 minutes before your scheduled appointment time.    You need to re-schedule your appointment should you arrive 10 or more minutes late.  We strive to give you quality time with our providers, and arriving late affects you and other patients whose appointments are after yours.  Also, if you no show three or more times for appointments you may be dismissed from the clinic at the providers discretion.     Again, thank you for choosing [facility].  Our hope is that these requests will decrease the amount of time that you wait before being seen by our physicians.       _____________________________________________________________  Should you have questions after your visit to [facility], please contact our office at [phone] between the hours of 8:30 a.m. and 4:30 p.m.  Voicemails left after 4:30 p.m. will not be returned until the following business day.  For prescription refill requests, have your pharmacy contact our office.

## 2015-03-09 NOTE — Progress Notes (Signed)
[place] [facility] [place] NOTE  Patient Care Team: [name], MD as PCP - General (Internal Medicine)  DIAGNOSIS:  Thrombocytopenia  Chronic Lymphocytic Leukemia/Small Lymphocytic lymphoma. ZAP 70 at 7% IgG low at 589 mg/dl Lymphadenopathy  HISTORY OF PRESENTING ILLNESS:  Andrew [name] 74 y.o. male is here for additional follow-up of CLL/SLL  He has multiple questions regarding vaccinations.  He received the "shingles" vaccine 4 years ago. He wonders if he should have this again. Discussed vaccinations with the patient.  He will have his flu vaccination today.  His last pneumonia vaccination was received about 4 years ago.  He currently has a "cold".  He complains of fatigue with his recent URI.  This is now improving.  Denies fever.  He is staying active.  He plays football with his grandson's.  His appetite is well.  Denies night sweats.    MEDICAL HISTORY:  Past Medical History  Diagnosis Date  . Hypertension   . Dysrhythmia     Hx. of Atrial Fibrillation- has resolved  . Pinched nerve in neck     SURGICAL HISTORY: Past Surgical History  Procedure Laterality Date  . Thyroidectomy, partial    . Cataract extraction, bilateral    . Tonsillectomy    . Colonoscopy  07/10/2011    Procedure: COLONOSCOPY;  Surgeon: Dorothyann Peng, MD;  [facility];  Service: Endoscopy;  Laterality: N/A;  10:30 AM    SOCIAL HISTORY: Social History   Social History  . Marital Status: Married    Spouse Name: N/A  . Number of Children: N/A  . Years of Education: N/A   Occupational History  . Not on file.   Social History Main Topics  . Smoking status: Former Smoker -- 1.00 packs/day for 15 years  . Smokeless tobacco: Not on file  . Alcohol Use: No  . Drug Use: No  . Sexual Activity: Not on file   Other Topics Concern  . Not on file   Social History Narrative  Married for 48 years. 3 children, 6 grandchildren. 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 He smoked one ppd for 15 years and quit in 1972. No ETOH or chewing tobacco  FAMILY HISTORY: Family History  Problem Relation Age of Onset  . Colon cancer Neg Hx    has no family status information on file.   Father died at 86 from an MI. He states they found "cancer" at his autopsy. Mother died at 54 from alzheimers.  One brother and sister who are healthy.  Has children. One daughter is a Marine scientist.  ALLERGIES:  has No Known Allergies.  MEDICATIONS:  Current Outpatient Prescriptions  Medication Sig Dispense Refill  . aspirin 325 MG tablet Take 325 mg by mouth daily.    . carvedilol (COREG) 12.5 MG tablet Take 12.5 mg by mouth 2 (two) times daily with a meal.    . Coenzyme Q10 (COQ10) 100 MG CAPS Take 1 tablet by mouth daily.    . fish oil-omega-3 fatty acids 1000 MG capsule Take 1 g by mouth 2 (two) times daily.    . Multiple Vitamin (MULITIVITAMIN WITH MINERALS) TABS Take 1 tablet by mouth daily.    Jabier Gauss 40-10-25 MG TABS Take 1 tablet by mouth daily.      Current Facility-Administered Medications  Medication Dose Route Frequency Provider Last Rate Last Dose  . Influenza vac split quadrivalent PF (  FLUARIX) injection 0.5 mL  0.5 mL Intramuscular Once Patrici Ranks, MD        Review of Systems  Constitutional: Negative for fever, chills, weight loss and malaise/fatigue.  HENT: Negative for congestion, hearing loss, nosebleeds, sore throat and tinnitus.   Eyes: Negative for blurred vision, double vision, pain and discharge.  Respiratory: Negative for cough, hemoptysis, sputum production, shortness of breath and wheezing.   Cardiovascular: Negative for chest pain, palpitations, claudication, leg swelling and PND.  Gastrointestinal: Negative for heartburn, nausea, vomiting, abdominal pain, diarrhea, constipation, blood in stool and melena.  Genitourinary: Negative for dysuria, urgency, frequency and hematuria.    Musculoskeletal: Negative for myalgias, joint pain and falls.  Skin: Negative for itching and rash.  Neurological: Negative for dizziness, tingling, tremors, sensory change, speech change, focal weakness, seizures, loss of consciousness, weakness and headaches.  Endo/Heme/Allergies: Does not bruise/bleed easily.  Psychiatric/Behavioral: Negative for depression, suicidal ideas, memory loss and substance abuse. The patient is not nervous/anxious and does not have insomnia.   14 point ROS was done and is otherwise as detailed above or in HPI    PHYSICAL EXAMINATION:  ECOG PERFORMANCE STATUS: 0 - Asymptomatic  Filed Vitals:   03/09/15 1104  BP: 103/68  Pulse: 65  Temp: 98 F (36.7 C)  Resp: 18   Filed Weights   03/09/15 1104  Weight: 233 lb (105.688 kg)   Physical Exam  Constitutional: He is oriented to person, place, and time and well-developed, well-nourished, and in no distress.  HENT:  Head: Normocephalic and atraumatic.  Nose: Nose normal.  Mouth/Throat: Oropharynx is clear and moist. No oropharyngeal exudate.  Eyes: Conjunctivae and EOM are normal. Pupils are equal, round, and reactive to light. Right eye exhibits no discharge. Left eye exhibits no discharge. No scleral icterus.  Neck: Normal range of motion. Neck supple. No tracheal deviation present. No thyromegaly present.  Cardiovascular: Normal rate, regular rhythm and normal heart sounds.  Exam reveals no gallop and no friction rub.   No murmur heard. Pulmonary/Chest: Effort normal and breath sounds normal. He has no wheezes. He has no rales.  Abdominal: Soft. Bowel sounds are normal. He exhibits no distension and no mass. There is no tenderness. There is no rebound and no guarding.  Musculoskeletal: Normal range of motion. He exhibits no edema.  Lymphadenopathy:    He has cervical adenopathy.       Left cervical: Superficial cervical and posterior cervical adenopathy present.       Left: Supraclavicular adenopathy  present.  Largest is 2 cm in diameter and mobile. Unchanged since last visit.  Neurological: He is alert and oriented to person, place, and time. He has normal reflexes. No cranial nerve deficit. Gait normal. Coordination normal.  Skin: Skin is warm and dry. No rash noted.  Psychiatric: Mood, memory, affect and judgment normal.  Nursing note and vitals reviewed.  LABORATORY DATA:  I have reviewed the data as listed Lab Results  Component Value Date   WBC 13.8* 01/28/2015   HGB 15.4 01/28/2015   HCT 42.8 01/28/2015   MCV 91.1 01/28/2015   PLT 100* 01/28/2015     Chemistry      Component Value Date/Time   NA 140 12/07/2014 1152   K 3.7 12/07/2014 1152   CL 105 12/07/2014 1152   CO2 27 12/07/2014 1152   BUN 27* 12/07/2014 1152   CREATININE 1.40* 12/07/2014 1152      Component Value Date/Time   CALCIUM 9.9 12/07/2014 1152  ALKPHOS 43 12/07/2014 1152   AST 28 12/07/2014 1152   ALT 24 12/07/2014 1152   BILITOT 1.2 12/07/2014 1152      ASSESSMENT & PLAN:   CLL/SLL Rai staging, Stage I Mild thrombocytopenia with last platelet count of 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  We discussed laboratory changes that would indicate the need to treat and subjective reasons to treat such as night sweats, fatigue.  We discussed his IgG levels and I advised him there is currently no indication for IVIG. We reviewed indications for IVIG therapy.  We reviewed his response to vaccinations,  I advised him the flu vaccination is recommended yearly. He has had the Prevnar vaccine. I provided him with information about CLL and recommended vaccinations. The Shingles vaccine is not currently recommended and we discussed why.  Follow up in 3 months with blood work and physical exam.  Orders Placed This Encounter  Procedures  . CBC with Differential    Standing Status: Future     Number of Occurrences:        Standing Expiration Date: 03/08/2016  . Comprehensive metabolic panel    Standing Status: Future     Number of Occurrences:      Standing Expiration Date: 03/08/2016  . Lactate dehydrogenase    Standing Status: Future     Number of Occurrences:      Standing Expiration Date: 03/08/2016   All questions were answered. The patient knows to call the clinic with any problems, questions or concerns. I have advised them to write down any questions to bring with them to their next follow-up.   This note was electronically signed.    This document serves as a record of services personally performed by Ancil Linsey, MD. It was created on her behalf by Arlyce Harman, a trained medical scribe. The creation of this record is based on the scribe's personal observations and the provider's statements to them. This document has been checked and approved by the attending provider.  I have reviewed the above documentation for accuracy and completeness, and I agree with the above.  Kelby Fam. Whitney Muse, MD

## 2015-06-07 DIAGNOSIS — S81802A Unspecified open wound, left lower leg, initial encounter: Secondary | ICD-10-CM | POA: Diagnosis not present

## 2015-06-09 ENCOUNTER — Ambulatory Visit (HOSPITAL_COMMUNITY): Payer: Medicare HMO | Admitting: Hematology & Oncology

## 2015-06-09 ENCOUNTER — Other Ambulatory Visit (HOSPITAL_COMMUNITY): Payer: Medicare HMO

## 2015-06-10 ENCOUNTER — Encounter (HOSPITAL_COMMUNITY): Payer: Self-pay | Admitting: Hematology & Oncology

## 2015-06-10 ENCOUNTER — Encounter (HOSPITAL_COMMUNITY): Payer: Medicare HMO

## 2015-06-10 ENCOUNTER — Encounter (HOSPITAL_COMMUNITY): Payer: Medicare HMO | Attending: Hematology & Oncology | Admitting: Hematology & Oncology

## 2015-06-10 DIAGNOSIS — C9110 Chronic lymphocytic leukemia of B-cell type not having achieved remission: Secondary | ICD-10-CM

## 2015-06-10 LAB — CBC WITH DIFFERENTIAL/PLATELET
BASOS ABS: 0 10*3/uL (ref 0.0–0.1)
BASOS PCT: 0 %
EOS ABS: 0.3 10*3/uL (ref 0.0–0.7)
Eosinophils Relative: 1 %
HCT: 44.9 % (ref 39.0–52.0)
HEMOGLOBIN: 16 g/dL (ref 13.0–17.0)
LYMPHS ABS: 15.5 10*3/uL — AB (ref 0.7–4.0)
LYMPHS PCT: 79 %
MCH: 32.4 pg (ref 26.0–34.0)
MCHC: 35.6 g/dL (ref 30.0–36.0)
MCV: 90.9 fL (ref 78.0–100.0)
MONO ABS: 0.5 10*3/uL (ref 0.1–1.0)
Monocytes Relative: 3 %
NEUTROS PCT: 17 %
Neutro Abs: 3.3 10*3/uL (ref 1.7–7.7)
PLATELETS: 105 10*3/uL — AB (ref 150–400)
RBC: 4.94 MIL/uL (ref 4.22–5.81)
RDW: 12.5 % (ref 11.5–15.5)
WBC: 19.6 10*3/uL — AB (ref 4.0–10.5)

## 2015-06-10 LAB — COMPREHENSIVE METABOLIC PANEL
ALBUMIN: 4.3 g/dL (ref 3.5–5.0)
ALT: 18 U/L (ref 17–63)
ANION GAP: 6 (ref 5–15)
AST: 28 U/L (ref 15–41)
Alkaline Phosphatase: 42 U/L (ref 38–126)
BUN: 30 mg/dL — ABNORMAL HIGH (ref 6–20)
CHLORIDE: 108 mmol/L (ref 101–111)
CO2: 26 mmol/L (ref 22–32)
Calcium: 9.7 mg/dL (ref 8.9–10.3)
Creatinine, Ser: 1.46 mg/dL — ABNORMAL HIGH (ref 0.61–1.24)
GFR calc non Af Amer: 45 mL/min — ABNORMAL LOW (ref >60)
GFR, EST AFRICAN AMERICAN: 52 mL/min — AB (ref >60)
GLUCOSE: 96 mg/dL (ref 65–99)
POTASSIUM: 3.9 mmol/L (ref 3.5–5.1)
SODIUM: 140 mmol/L (ref 135–145)
Total Bilirubin: 0.8 mg/dL (ref 0.3–1.2)
Total Protein: 6.4 g/dL — ABNORMAL LOW (ref 6.5–8.1)

## 2015-06-10 LAB — LACTATE DEHYDROGENASE: LDH: 136 U/L (ref 98–192)

## 2015-06-10 NOTE — Progress Notes (Signed)
This encounter was created in error - please disregard.

## 2015-06-17 DIAGNOSIS — M25469 Effusion, unspecified knee: Secondary | ICD-10-CM | POA: Diagnosis not present

## 2015-07-01 ENCOUNTER — Encounter (HOSPITAL_COMMUNITY): Payer: Self-pay | Admitting: Hematology & Oncology

## 2015-07-01 ENCOUNTER — Encounter (HOSPITAL_COMMUNITY): Payer: Medicare HMO | Attending: Hematology & Oncology | Admitting: Hematology & Oncology

## 2015-07-01 VITALS — BP 115/72 | HR 60 | Temp 97.5°F | Resp 16 | Wt 229.0 lb

## 2015-07-01 DIAGNOSIS — Z23 Encounter for immunization: Secondary | ICD-10-CM | POA: Diagnosis not present

## 2015-07-01 DIAGNOSIS — D696 Thrombocytopenia, unspecified: Secondary | ICD-10-CM | POA: Diagnosis not present

## 2015-07-01 DIAGNOSIS — C9110 Chronic lymphocytic leukemia of B-cell type not having achieved remission: Secondary | ICD-10-CM

## 2015-07-01 DIAGNOSIS — D801 Nonfamilial hypogammaglobulinemia: Secondary | ICD-10-CM | POA: Diagnosis not present

## 2015-07-01 MED ORDER — TETANUS-DIPHTH-ACELL PERTUSSIS 5-2.5-18.5 LF-MCG/0.5 IM SUSP
0.5000 mL | Freq: Once | INTRAMUSCULAR | Status: AC
  Administered 2015-07-01: 0.5 mL via INTRAMUSCULAR
  Filled 2015-07-01: qty 0.5

## 2015-07-01 NOTE — Progress Notes (Signed)
[place] at [facility] NOTE  Patient Care Team: [name], MD as PCP - General (Internal Medicine)  DIAGNOSIS:  Thrombocytopenia  Chronic Lymphocytic Leukemia/Small Lymphocytic lymphoma. ZAP 70 at 7% IgG low at 589 mg/dl Lymphadenopathy  HISTORY OF PRESENTING ILLNESS:  Andrew [name] 75 y.o. male is here for additional follow-up of CLL/SLL  Mr. Judy Pimple is accompanied by his wife. They are currently on weight watchers. His appetite is good. He is currently getting over a cold but has not experienced any problems managing symptoms. Denies night sweats and any new lumps or bumps. Denies any chest pain or SOB.   He has no major complaints or concerns. He has questions again about the shingles vaccine.   MEDICAL HISTORY:  Past Medical History  Diagnosis Date  . Hypertension   . Dysrhythmia     Hx. of Atrial Fibrillation- has resolved  . Pinched nerve in neck     SURGICAL HISTORY: Past Surgical History  Procedure Laterality Date  . Thyroidectomy, partial    . Cataract extraction, bilateral    . Tonsillectomy    . Colonoscopy  07/10/2011    Procedure: COLONOSCOPY;  Surgeon: Dorothyann Peng, MD;  [facility];  Service: Endoscopy;  Laterality: N/A;  10:30 AM    SOCIAL HISTORY: Social History   Social History  . Marital Status: Married    Spouse Name: N/A  . Number of Children: N/A  . Years of Education: N/A   Occupational History  . Not on file.   Social History Main Topics  . Smoking status: Former Smoker -- 1.00 packs/day for 15 years  . Smokeless tobacco: Not on file  . Alcohol Use: No  . Drug Use: No  . Sexual Activity: Not on file   Other Topics Concern  . Not on file   Social History Narrative  Married for 48 years. 3 children, 6 grandchildren. 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 He smoked one ppd for 15 years and quit in 1972. No ETOH or chewing tobacco  FAMILY  HISTORY: Family History  Problem Relation Age of Onset  . Colon cancer Neg Hx    has no family status information on file.   Father died at 69 from an MI. He states they found "cancer" at his autopsy. Mother died at 24 from alzheimers.  One brother and sister who are healthy.  Has children. One daughter is a Marine scientist.  ALLERGIES:  has No Known Allergies.  MEDICATIONS:  Current Outpatient Prescriptions  Medication Sig Dispense Refill  . aspirin 325 MG tablet Take 325 mg by mouth daily.    . carvedilol (COREG) 12.5 MG tablet Take 12.5 mg by mouth 2 (two) times daily with a meal.    . Coenzyme Q10 (COQ10) 100 MG CAPS Take 1 tablet by mouth daily.    . fish oil-omega-3 fatty acids 1000 MG capsule Take 1 g by mouth 2 (two) times daily.    . minocycline (MINOCIN,DYNACIN) 100 MG capsule for 10 days    . Multiple Vitamin (MULITIVITAMIN WITH MINERALS) TABS Take 1 tablet by mouth daily.    Jabier Gauss 40-10-25 MG TABS Take 1 tablet by mouth daily.      No current facility-administered medications for this visit.    Review of Systems  Constitutional: Negative for fever, chills, weight loss and malaise/fatigue.  HENT: Negative for congestion, hearing loss, nosebleeds, sore throat and tinnitus.  Eyes: Negative for blurred vision, double vision, pain and discharge.  Respiratory: Negative for cough, hemoptysis, sputum production, shortness of breath and wheezing.   Cardiovascular: Negative for chest pain, palpitations, claudication, leg swelling and PND.  Gastrointestinal: Negative for heartburn, nausea, vomiting, abdominal pain, diarrhea, constipation, blood in stool and melena.  Genitourinary: Negative for dysuria, urgency, frequency and hematuria.  Musculoskeletal: Negative for myalgias, joint pain and falls.  Skin: Negative for itching and rash.  Neurological: Negative for dizziness, tingling, tremors, sensory change, speech change, focal weakness, seizures, loss of consciousness, weakness and  headaches.  Endo/Heme/Allergies: Does not bruise/bleed easily.  Psychiatric/Behavioral: Negative for depression, suicidal ideas, memory loss and substance abuse. The patient is not nervous/anxious and does not have insomnia.   14 point ROS was done and is otherwise as detailed above or in HPI   PHYSICAL EXAMINATION:  ECOG PERFORMANCE STATUS: 0 - Asymptomatic  Filed Vitals:   07/01/15 1057  BP: 115/72  Pulse: 60  Temp: 97.5 F (36.4 C)  Resp: 16   Filed Weights   07/01/15 1057  Weight: 229 lb (103.874 kg)   Physical Exam  Constitutional: He is oriented to person, place, and time and well-developed, well-nourished, and in no distress.  HENT:  Head: Normocephalic and atraumatic.  Nose: Nose normal.  Mouth/Throat: Oropharynx is clear and moist. No oropharyngeal exudate.  Eyes: Conjunctivae and EOM are normal. Pupils are equal, round, and reactive to light. Right eye exhibits no discharge. Left eye exhibits no discharge. No scleral icterus.  Neck: Normal range of motion. Neck supple. No tracheal deviation present. No thyromegaly present.  Cardiovascular: Normal rate, regular rhythm and normal heart sounds.  Exam reveals no gallop and no friction rub.   No murmur heard. Pulmonary/Chest: Effort normal and breath sounds normal. He has no wheezes. He has no rales.  Abdominal: Soft. Bowel sounds are normal. He exhibits no distension and no mass. There is no tenderness. There is no rebound and no guarding.  Musculoskeletal: Normal range of motion. He exhibits no edema.  Lymphadenopathy:    He has cervical adenopathy.       Left cervical: Superficial cervical and posterior cervical adenopathy present.       Left: Supraclavicular adenopathy present.  Largest is 1 cm in diameter and mobile. Unchanged since last visit.  Neurological: He is alert and oriented to person, place, and time. He has normal reflexes. No cranial nerve deficit. Gait normal. Coordination normal.  Skin: Skin is warm  and dry. No rash noted.  Psychiatric: Mood, memory, affect and judgment normal.  Nursing note and vitals reviewed.  LABORATORY DATA:  I have reviewed the data as listed Lab Results  Component Value Date   WBC 19.6* 06/10/2015   HGB 16.0 06/10/2015   HCT 44.9 06/10/2015   MCV 90.9 06/10/2015   PLT 105* 06/10/2015     Chemistry      Component Value Date/Time   NA 140 06/10/2015 0915   K 3.9 06/10/2015 0915   CL 108 06/10/2015 0915   CO2 26 06/10/2015 0915   BUN 30* 06/10/2015 0915   CREATININE 1.46* 06/10/2015 0915      Component Value Date/Time   CALCIUM 9.7 06/10/2015 0915   ALKPHOS 42 06/10/2015 0915   AST 28 06/10/2015 0915   ALT 18 06/10/2015 0915   BILITOT 0.8 06/10/2015 0915      ASSESSMENT & PLAN:   CLL/SLL Rai staging, Stage I Mild thrombocytopenia with last platelet count of 113K IgG low at 589  mg/dl, hypogammaglobulenemia  We will continue with ongoing observation of his CLL. There is no indication for treatment.  I explained to the patient that he should avoid live vaccines in the future, including the shingles vaccine. Mr. Judy Pimple will receive a tetanus vaccine in the next few days.   He will return in 4 months. At this next visit, we will recheck his immunoglobulin levels.   Orders Placed This Encounter  Procedures  . CBC with Differential    Standing Status: Future     Number of Occurrences:      Standing Expiration Date: 06/30/2016  . Comprehensive metabolic panel    Standing Status: Future     Number of Occurrences:      Standing Expiration Date: 06/30/2016  . Lactate dehydrogenase    Standing Status: Future     Number of Occurrences:      Standing Expiration Date: 06/30/2016  . Immunofixation electrophoresis    Standing Status: Future     Number of Occurrences:      Standing Expiration Date: 06/30/2016  . IgG, IgA, IgM    Standing Status: Future     Number of Occurrences:      Standing Expiration Date: 06/30/2016  . Protein electrophoresis,  serum    Standing Status: Future     Number of Occurrences:      Standing Expiration Date: 06/30/2016   All questions were answered. The patient knows to call the clinic with any problems, questions or concerns. I have advised them to write down any questions to bring with them to their next follow-up.   This note was electronically signed.    This document serves as a record of services personally performed by Ancil Linsey, MD. It was created on her behalf by Arlyce Harman, a trained medical scribe. The creation of this record is based on the scribe's personal observations and the provider's statements to them. This document has been checked and approved by the attending provider.  I have reviewed the above documentation for accuracy and completeness, and I agree with the above.  Kelby Fam. Whitney Muse, MD

## 2015-07-01 NOTE — Patient Instructions (Addendum)
[place] at [facility] Discharge Instructions  RECOMMENDATIONS MADE BY THE CONSULTANT AND ANY TEST RESULTS WILL BE SENT TO YOUR REFERRING PHYSICIAN.     Exam and discussion by Dr Whitney Muse today They recommend people with CLL not to get live vaccines such as shingles vaccines Tetanus shot today   Keep an eye on for sign/symptoms of shingles you can call us, it is unilateral.  You can call us and we can get you a prescription for it. Return to see the doctor in 4 months with labs  Please call the clinic if you have any questions or concerns      Thank you for choosing [place] at [facility] to provide your oncology and hematology care.  To afford each patient quality time with our provider, please arrive at least 15 minutes before your scheduled appointment time.   Beginning January 23rd 2017 lab work for the [facility] will be done in the  Main lab at [facility] on 1st floor. If you have a lab appointment with the [facility] please come in thru the  Main Entrance and check in at the main information desk  You need to re-schedule your appointment should you arrive 10 or more minutes late.  We strive to give you quality time with our providers, and arriving late affects you and other patients whose appointments are after yours.  Also, if you no show three or more times for appointments you may be dismissed from the clinic at the providers discretion.     Again, thank you for choosing [facility].  Our hope is that these requests will decrease the amount of time that you wait before being seen by our physicians.       _____________________________________________________________  Should you have questions after your visit to [facility], please contact our office at [phone] between the hours of 8:30 a.m. and 4:30 p.m.  Voicemails left after 4:30 p.m. will not be returned until the following business  day.  For prescription refill requests, have your pharmacy contact our office.

## 2015-07-01 NOTE — Progress Notes (Signed)
Andrew [name]'s reason for visit today is for an injection as scheduled per MD orders.    Andrew [name] also received DTAP per MD orders; see [facility] for administration details.  Andrew [name] tolerated all procedures well and without incident; questions were answered and patient was discharged.

## 2015-07-08 DIAGNOSIS — E782 Mixed hyperlipidemia: Secondary | ICD-10-CM | POA: Diagnosis not present

## 2015-07-08 DIAGNOSIS — I10 Essential (primary) hypertension: Secondary | ICD-10-CM | POA: Diagnosis not present

## 2015-07-08 DIAGNOSIS — R7301 Impaired fasting glucose: Secondary | ICD-10-CM | POA: Diagnosis not present

## 2015-07-12 DIAGNOSIS — D696 Thrombocytopenia, unspecified: Secondary | ICD-10-CM | POA: Diagnosis not present

## 2015-07-12 DIAGNOSIS — R944 Abnormal results of kidney function studies: Secondary | ICD-10-CM | POA: Diagnosis not present

## 2015-07-12 DIAGNOSIS — G589 Mononeuropathy, unspecified: Secondary | ICD-10-CM | POA: Diagnosis not present

## 2015-07-12 DIAGNOSIS — R7301 Impaired fasting glucose: Secondary | ICD-10-CM | POA: Diagnosis not present

## 2015-09-09 DIAGNOSIS — H5210 Myopia, unspecified eye: Secondary | ICD-10-CM | POA: Diagnosis not present

## 2015-10-29 ENCOUNTER — Ambulatory Visit (HOSPITAL_COMMUNITY): Payer: Medicare HMO | Admitting: Hematology & Oncology

## 2015-10-29 ENCOUNTER — Other Ambulatory Visit (HOSPITAL_COMMUNITY): Payer: Medicare HMO

## 2015-11-04 ENCOUNTER — Encounter (HOSPITAL_COMMUNITY): Payer: Medicare HMO

## 2015-11-04 ENCOUNTER — Encounter (HOSPITAL_COMMUNITY): Payer: Medicare HMO | Attending: Oncology | Admitting: Oncology

## 2015-11-04 ENCOUNTER — Encounter (HOSPITAL_COMMUNITY): Payer: Self-pay | Admitting: Oncology

## 2015-11-04 VITALS — BP 98/56 | HR 81 | Temp 97.7°F | Resp 18 | Wt 215.0 lb

## 2015-11-04 DIAGNOSIS — R591 Generalized enlarged lymph nodes: Secondary | ICD-10-CM | POA: Diagnosis not present

## 2015-11-04 DIAGNOSIS — I10 Essential (primary) hypertension: Secondary | ICD-10-CM | POA: Insufficient documentation

## 2015-11-04 DIAGNOSIS — Z9889 Other specified postprocedural states: Secondary | ICD-10-CM | POA: Diagnosis not present

## 2015-11-04 DIAGNOSIS — C9110 Chronic lymphocytic leukemia of B-cell type not having achieved remission: Secondary | ICD-10-CM | POA: Insufficient documentation

## 2015-11-04 DIAGNOSIS — Z87891 Personal history of nicotine dependence: Secondary | ICD-10-CM | POA: Insufficient documentation

## 2015-11-04 LAB — COMPREHENSIVE METABOLIC PANEL
ALBUMIN: 4.4 g/dL (ref 3.5–5.0)
ALT: 19 U/L (ref 17–63)
AST: 30 U/L (ref 15–41)
Alkaline Phosphatase: 39 U/L (ref 38–126)
Anion gap: 6 (ref 5–15)
BUN: 24 mg/dL — AB (ref 6–20)
CHLORIDE: 108 mmol/L (ref 101–111)
CO2: 25 mmol/L (ref 22–32)
Calcium: 9.5 mg/dL (ref 8.9–10.3)
Creatinine, Ser: 1.3 mg/dL — ABNORMAL HIGH (ref 0.61–1.24)
GFR calc Af Amer: >60 mL/min (ref >60)
GFR, EST NON AFRICAN AMERICAN: 52 mL/min — AB (ref >60)
GLUCOSE: 144 mg/dL — AB (ref 65–99)
POTASSIUM: 3.6 mmol/L (ref 3.5–5.1)
SODIUM: 139 mmol/L (ref 135–145)
Total Bilirubin: 1 mg/dL (ref 0.3–1.2)
Total Protein: 6.3 g/dL — ABNORMAL LOW (ref 6.5–8.1)

## 2015-11-04 LAB — CBC WITH DIFFERENTIAL/PLATELET
BAND NEUTROPHILS: 0 %
BASOS PCT: 0 %
Basophils Absolute: 0 10*3/uL (ref 0.0–0.1)
Blasts: 0 %
EOS ABS: 0.6 10*3/uL (ref 0.0–0.7)
Eosinophils Relative: 2 %
HEMATOCRIT: 43.9 % (ref 39.0–52.0)
HEMOGLOBIN: 15.5 g/dL (ref 13.0–17.0)
LYMPHS PCT: 79 %
Lymphs Abs: 21.9 10*3/uL — ABNORMAL HIGH (ref 0.7–4.0)
MCH: 32.3 pg (ref 26.0–34.0)
MCHC: 35.3 g/dL (ref 30.0–36.0)
MCV: 91.5 fL (ref 78.0–100.0)
MONO ABS: 0 10*3/uL — AB (ref 0.1–1.0)
MYELOCYTES: 0 %
Metamyelocytes Relative: 0 %
Monocytes Relative: 0 %
NEUTROS PCT: 19 %
Neutro Abs: 5.3 10*3/uL (ref 1.7–7.7)
OTHER: 0 %
PLATELETS: 82 10*3/uL — AB (ref 150–400)
PROMYELOCYTES ABS: 0 %
RBC: 4.8 MIL/uL (ref 4.22–5.81)
RDW: 12.9 % (ref 11.5–15.5)
WBC: 27.8 10*3/uL — AB (ref 4.0–10.5)
nRBC: 0 /100 WBC

## 2015-11-04 LAB — LACTATE DEHYDROGENASE: LDH: 142 U/L (ref 98–192)

## 2015-11-04 NOTE — Progress Notes (Signed)
Wende Neighbors, MD [place] [address]  CLL (chronic lymphocytic leukemia) [facility]) - Plan: CBC with Differential, Comprehensive metabolic panel, Lactate dehydrogenase  CURRENT THERAPY: Close observation  INTERVAL HISTORY: Andrew [name] 75 y.o. [name] returns for followup of CLL with palpable lymphadenopathy and ZAP 70 at 7%.   I personally reviewed and went over laboratory results with the patient.  The results are noted within this dictation.  Labs are overall stable.  Minimal change in platelets are noted.  HGB is stable.  Minimal increase in WBC.    He denies any B symptoms at this time.  His appetite is strong.  He denies any weight loss.   He remains very active and is still working part time for the [employer].  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  Review of Systems  Constitutional: Negative.  Negative for fever, chills, weight loss and malaise/fatigue.  HENT: Negative.   Eyes: Negative.   Respiratory: Negative.   Cardiovascular: Negative.   Gastrointestinal: Negative.   Genitourinary: Negative.   Musculoskeletal: Negative.   Skin: Negative.  Negative for itching and rash.  Neurological: Negative.  Negative for weakness.  Endo/Heme/Allergies: Negative.   Psychiatric/Behavioral: Negative.     Past Medical History  Diagnosis Date  . Hypertension   . Dysrhythmia     Hx. of Atrial Fibrillation- has resolved  . Pinched nerve in neck     Past Surgical History  Procedure Laterality Date  . Thyroidectomy, partial    . Cataract extraction, bilateral    . Tonsillectomy    . Colonoscopy  07/10/2011    Procedure: COLONOSCOPY;  Surgeon: Dorothyann Peng, MD;  [facility];  Service: Endoscopy;  Laterality: N/A;  10:30 AM    Family History  Problem Relation Age of Onset  . Colon cancer Neg Hx     Social History   Social History  . Marital Status: Married    Spouse Name: N/A  . Number of  Children: N/A  . Years of Education: N/A   Social History Main Topics  . Smoking status: Former Smoker -- 1.00 packs/day for 15 years  . Smokeless tobacco: None  . Alcohol Use: No  . Drug Use: No  . Sexual Activity: Not Asked   Other Topics Concern  . None   Social History Narrative     PHYSICAL EXAMINATION  ECOG PERFORMANCE STATUS: 0 - Asymptomatic  Filed Vitals:   11/04/15 1313  BP: 98/56  Pulse: 81  Temp: 97.7 F (36.5 C)  Resp: 18    GENERAL:alert, no distress, well nourished, well developed, comfortable, cooperative, smiling and unaccompanied SKIN: skin color, texture, turgor are normal, no rashes or significant lesions HEAD: Normocephalic, No masses, lesions, tenderness or abnormalities EYES: normal, EOMI, Conjunctiva are pink and non-injected EARS: External ears normal OROPHARYNX:lips, buccal mucosa, and tongue normal and mucous membranes are moist  NECK: supple, trachea midline, see lymph exam LYMPH:  enlarged lymph nodes palpated in left anterior cervical chain and posterior cervical chain, left supraclavicular adenopathy, and submental node. BREAST:not examined LUNGS: clear to auscultation and percussion HEART: regular rate & rhythm, no murmurs, no gallops, S1 normal and S2 normal ABDOMEN:abdomen soft, non-tender and normal bowel sounds BACK: Back symmetric, no curvature., No CVA tenderness EXTREMITIES:less then 2 second capillary refill, no joint deformities, effusion, or inflammation, no skin discoloration  NEURO: alert & oriented x 3 with fluent speech, no  focal motor/sensory deficits, gait normal    LABORATORY DATA: CBC    Component Value Date/Time   WBC 27.8* 11/04/2015 1258   RBC 4.80 11/04/2015 1258   HGB 15.5 11/04/2015 1258   HCT 43.9 11/04/2015 1258   PLT 82* 11/04/2015 1258   MCV 91.5 11/04/2015 1258   MCH 32.3 11/04/2015 1258   MCHC 35.3 11/04/2015 1258   RDW 12.9 11/04/2015 1258   LYMPHSABS 21.9* 11/04/2015 1258   MONOABS 0.0*  11/04/2015 1258   EOSABS 0.6 11/04/2015 1258   BASOSABS 0.0 11/04/2015 1258      Chemistry      Component Value Date/Time   NA 139 11/04/2015 1258   K 3.6 11/04/2015 1258   CL 108 11/04/2015 1258   CO2 25 11/04/2015 1258   BUN 24* 11/04/2015 1258   CREATININE 1.30* 11/04/2015 1258      Component Value Date/Time   CALCIUM 9.5 11/04/2015 1258   ALKPHOS 39 11/04/2015 1258   AST 30 11/04/2015 1258   ALT 19 11/04/2015 1258   BILITOT 1.0 11/04/2015 1258        PENDING LABS:   RADIOGRAPHIC STUDIES:  No results found.   PATHOLOGY:    ASSESSMENT AND PLAN:  CLL (chronic lymphocytic leukemia) (HCC) CLL, stable.  Labs updated today.  Labs in 4 months: CBC diff, CMET, LDH  Return in 4 months for follow-up.    ORDERS PLACED FOR THIS ENCOUNTER: Orders Placed This Encounter  Procedures  . CBC with Differential  . Comprehensive metabolic panel  . Lactate dehydrogenase    MEDICATIONS PRESCRIBED THIS ENCOUNTER: No orders of the defined types were placed in this encounter.    THERAPY PLAN:  NCCN guidelines for indication for treatment of CLL are:  A. Eligible for clinical trial  B. Significant disease-related symptoms   1. Fatigue (severe)   2. Night sweats   3. Weight loss   4. Fever without infection  C. Threatened end-organ function  D. Progressive bulky disease (spleen >6cm below costal margin, lymph nodes >10 cm)  E. Progressive anemia  F. Progressive thrombocytopenia.   All questions were answered. The patient knows to call the clinic with any problems, questions or concerns. We can certainly see the patient much sooner if necessary.  Patient and plan discussed with Dr. Ancil Linsey and she is in agreement with the aforementioned.   This note is electronically signed by: Doy Mince 11/04/2015 6:17 PM

## 2015-11-04 NOTE — Patient Instructions (Signed)
[place] at [facility] Discharge Instructions  RECOMMENDATIONS MADE BY THE CONSULTANT AND ANY TEST RESULTS WILL BE SENT TO YOUR REFERRING PHYSICIAN.  Labs today are pending at this time. We will update you when they are available for review.  Of course we will release the results to MyChart for your personal review. Labs in 4 months. Return in 4 months for follow-up.  Thank you for choosing [place] at [facility] to provide your oncology and hematology care.  To afford each patient quality time with our provider, please arrive at least 15 minutes before your scheduled appointment time.   Beginning January 23rd 2017 lab work for the [facility] will be done in the  Main lab at [facility] on 1st floor. If you have a lab appointment with the [facility] please come in thru the  Main Entrance and check in at the main information desk  You need to re-schedule your appointment should you arrive 10 or more minutes late.  We strive to give you quality time with our providers, and arriving late affects you and other patients whose appointments are after yours.  Also, if you no show three or more times for appointments you may be dismissed from the clinic at the providers discretion.     Again, thank you for choosing [facility].  Our hope is that these requests will decrease the amount of time that you wait before being seen by our physicians.       _____________________________________________________________  Should you have questions after your visit to [facility], please contact our office at [phone] between the hours of 8:30 a.m. and 4:30 p.m.  Voicemails left after 4:30 p.m. will not be returned until the following business day.  For prescription refill requests, have your pharmacy contact our office.         Resources For Cancer Patients and their Caregivers ? American Cancer Society: Can assist with  transportation, wigs, general needs, runs Look Good Feel Better.        503-881-9168 ? Cancer Care: Provides financial assistance, online support groups, medication/co-pay assistance.  1-800-813-HOPE 979-430-4417) ? [facility] Assists [place] cancer patients and their families through emotional , educational and financial support.  831-156-1309 ? [place] DSS Where to apply for food stamps, Medicaid and utility assistance. ([phone] ? RCATS: Transportation to medical appointments. (934) 111-7184 ? Social Security Administration: May apply for disability if have a Stage IV cancer. 216-217-1316 424-735-6065 ? [employer], Disability and Transit Services: Assists with nutrition, care and transit needs. [place] Support Programs: @10RELATIVEDAYS @ > Cancer Support Group  2nd Tuesday of the month 1pm-2pm, Journey Room  > Creative Journey  3rd Tuesday of the month 1130am-1pm, Journey Room  > Look Good Feel Better  1st Wednesday of the month 10am-12 noon, Journey Room (Call [place] to register [phone])

## 2015-11-04 NOTE — Assessment & Plan Note (Signed)
CLL, stable.  Labs updated today.  Labs in 4 months: CBC diff, CMET, LDH  Return in 4 months for follow-up.

## 2015-11-05 LAB — PROTEIN ELECTROPHORESIS, SERUM
A/G RATIO SPE: 2 — AB (ref 0.7–1.7)
ALBUMIN ELP: 4.1 g/dL (ref 2.9–4.4)
Alpha-1-Globulin: 0.2 g/dL (ref 0.0–0.4)
Alpha-2-Globulin: 0.5 g/dL (ref 0.4–1.0)
BETA GLOBULIN: 0.8 g/dL (ref 0.7–1.3)
GLOBULIN, TOTAL: 2 g/dL — AB (ref 2.2–3.9)
Gamma Globulin: 0.4 g/dL (ref 0.4–1.8)
Total Protein ELP: 6.1 g/dL (ref 6.0–8.5)

## 2015-11-05 LAB — IGG, IGA, IGM
IGA: 33 mg/dL — AB (ref 61–437)
IGM, SERUM: 14 mg/dL — AB (ref 15–143)
IgG (Immunoglobin G), Serum: 465 mg/dL — ABNORMAL LOW (ref 700–1600)

## 2015-11-05 LAB — IMMUNOFIXATION ELECTROPHORESIS
IGG (IMMUNOGLOBIN G), SERUM: 464 mg/dL — AB (ref 700–1600)
IgA: 31 mg/dL — ABNORMAL LOW (ref 61–437)
IgM, Serum: 15 mg/dL (ref 15–143)
Total Protein ELP: 5.9 g/dL — ABNORMAL LOW (ref 6.0–8.5)

## 2016-03-06 ENCOUNTER — Encounter (HOSPITAL_COMMUNITY): Payer: Medicare HMO | Attending: Hematology & Oncology | Admitting: Hematology & Oncology

## 2016-03-06 ENCOUNTER — Encounter (HOSPITAL_COMMUNITY): Payer: Medicare HMO

## 2016-03-06 DIAGNOSIS — C9110 Chronic lymphocytic leukemia of B-cell type not having achieved remission: Secondary | ICD-10-CM | POA: Diagnosis not present

## 2016-03-06 DIAGNOSIS — D801 Nonfamilial hypogammaglobulinemia: Secondary | ICD-10-CM

## 2016-03-06 DIAGNOSIS — Z23 Encounter for immunization: Secondary | ICD-10-CM

## 2016-03-06 DIAGNOSIS — D696 Thrombocytopenia, unspecified: Secondary | ICD-10-CM | POA: Diagnosis not present

## 2016-03-06 DIAGNOSIS — R599 Enlarged lymph nodes, unspecified: Secondary | ICD-10-CM | POA: Diagnosis not present

## 2016-03-06 DIAGNOSIS — Z79899 Other long term (current) drug therapy: Secondary | ICD-10-CM | POA: Insufficient documentation

## 2016-03-06 DIAGNOSIS — Z7982 Long term (current) use of aspirin: Secondary | ICD-10-CM | POA: Insufficient documentation

## 2016-03-06 DIAGNOSIS — Z87891 Personal history of nicotine dependence: Secondary | ICD-10-CM | POA: Insufficient documentation

## 2016-03-06 DIAGNOSIS — Z0000 Encounter for general adult medical examination without abnormal findings: Secondary | ICD-10-CM

## 2016-03-06 LAB — COMPREHENSIVE METABOLIC PANEL
ALBUMIN: 4.4 g/dL (ref 3.5–5.0)
ALK PHOS: 44 U/L (ref 38–126)
ALT: 17 U/L (ref 17–63)
AST: 23 U/L (ref 15–41)
Anion gap: 4 — ABNORMAL LOW (ref 5–15)
BUN: 25 mg/dL — AB (ref 6–20)
CALCIUM: 9.8 mg/dL (ref 8.9–10.3)
CO2: 28 mmol/L (ref 22–32)
CREATININE: 1.39 mg/dL — AB (ref 0.61–1.24)
Chloride: 106 mmol/L (ref 101–111)
GFR calc Af Amer: 56 mL/min — ABNORMAL LOW (ref >60)
GFR, EST NON AFRICAN AMERICAN: 48 mL/min — AB (ref >60)
Glucose, Bld: 87 mg/dL (ref 65–99)
Potassium: 4.2 mmol/L (ref 3.5–5.1)
SODIUM: 138 mmol/L (ref 135–145)
Total Bilirubin: 1.2 mg/dL (ref 0.3–1.2)
Total Protein: 6.7 g/dL (ref 6.5–8.1)

## 2016-03-06 LAB — CBC WITH DIFFERENTIAL/PLATELET
BASOS ABS: 0 10*3/uL (ref 0.0–0.1)
Basophils Relative: 0 %
Eosinophils Absolute: 0.5 10*3/uL (ref 0.0–0.7)
Eosinophils Relative: 1 %
HCT: 44.4 % (ref 39.0–52.0)
Hemoglobin: 15.7 g/dL (ref 13.0–17.0)
LYMPHS ABS: 39.6 10*3/uL — AB (ref 0.7–4.0)
Lymphocytes Relative: 88 %
MCH: 32.8 pg (ref 26.0–34.0)
MCHC: 35.4 g/dL (ref 30.0–36.0)
MCV: 92.7 fL (ref 78.0–100.0)
Monocytes Absolute: 0.9 10*3/uL (ref 0.1–1.0)
Monocytes Relative: 2 %
Neutro Abs: 4.1 10*3/uL (ref 1.7–7.7)
Neutrophils Relative %: 9 %
Platelets: 91 10*3/uL — ABNORMAL LOW (ref 150–400)
RBC: 4.79 MIL/uL (ref 4.22–5.81)
RDW: 12.9 % (ref 11.5–15.5)
WBC: 45.1 10*3/uL — AB (ref 4.0–10.5)

## 2016-03-06 LAB — LACTATE DEHYDROGENASE: LDH: 127 U/L (ref 98–192)

## 2016-03-06 MED ORDER — INFLUENZA VAC SPLIT QUAD 0.5 ML IM SUSY
0.5000 mL | PREFILLED_SYRINGE | Freq: Once | INTRAMUSCULAR | Status: DC
  Filled 2016-03-06: qty 0.5

## 2016-03-06 NOTE — Progress Notes (Signed)
Pt given flu shot. Pt tolerated well. Pt stable.

## 2016-03-06 NOTE — Progress Notes (Signed)
[place] at [facility] [place] NOTE  Patient Care Team: [name], MD as PCP - General (Internal Medicine)  DIAGNOSIS:  Thrombocytopenia  Chronic Lymphocytic Leukemia/Small Lymphocytic lymphoma. ZAP 70 at 7% IgG low at 589 mg/dl Lymphadenopathy  HISTORY OF PRESENTING ILLNESS:  Andrew [name] 75 y.o. male is here for additional follow-up of CLL/SLL  Andrew [name] is unaccompanied. I personally reviewed and went over laboratory studies with the patient, results are noted below.  He has not been sick recently. He denies pain, weight loss, night sweats, or discomfort under his arms. He denies bowel issues or nausea.  He has noticed a gradual increase in swelling of his neck. He denies difficulty swallowing or breathing.  He remarks that the adenopathy in his neck is more of "looks" issues than anything else. It makes him somewhat self conscious.  No fever. Energy is baseline and unchanged from prior.   MEDICAL HISTORY:  Past Medical History:  Diagnosis Date  . Dysrhythmia    Hx. of Atrial Fibrillation- has resolved  . Hypertension   . Pinched nerve in neck     SURGICAL HISTORY: Past Surgical History:  Procedure Laterality Date  . CATARACT EXTRACTION, BILATERAL    . COLONOSCOPY  07/10/2011   Procedure: COLONOSCOPY;  Surgeon: Dorothyann Peng, MD;  Location[facility];  Service: Endoscopy;  Laterality: N/A;  10:30 AM  . THYROIDECTOMY, PARTIAL    . TONSILLECTOMY      SOCIAL HISTORY: Social History   Social History  . Marital status: Married    Spouse name: N/A  . Number of children: N/A  . Years of education: N/A   Occupational History  . Not on file.   Social History Main Topics  . Smoking status: Former Smoker    Packs/day: 1.00    Years: 15.00  . Smokeless tobacco: Never Used  . Alcohol use No  . Drug use: No  . Sexual activity: Not on file   Other Topics Concern  . Not on file   Social History Narrative  . No narrative on file    Married for 48 years. 3 children, 6 grandchildren. 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 He smoked one ppd for 15 years and quit in 1972. No ETOH or chewing tobacco  FAMILY HISTORY: Family History  Problem Relation Age of Onset  . Colon cancer Neg Hx    has no family status information on file.    Father died at 32 from an MI. He states they found "cancer" at his autopsy. Mother died at 40 from alzheimers.  One brother and sister who are healthy.  Has children. One daughter is a Marine scientist.  ALLERGIES:  has No Known Allergies.  MEDICATIONS:  Current Outpatient Prescriptions  Medication Sig Dispense Refill  . aspirin 325 MG tablet Take 325 mg by mouth daily.    . carvedilol (COREG) 12.5 MG tablet Take 12.5 mg by mouth 2 (two) times daily with a meal.    . Coenzyme Q10 (COQ10) 100 MG CAPS Take 1 tablet by mouth daily.    . fish oil-omega-3 fatty acids 1000 MG capsule Take 1 g by mouth 2 (two) times daily.    . Multiple Vitamin (MULITIVITAMIN WITH MINERALS) TABS Take 1 tablet by mouth daily.    Marland Kitchen telmisartan-hydrochlorothiazide (MICARDIS HCT) 80-25 MG tablet Take 1 tablet by mouth daily.     Marland Kitchen amLODipine (NORVASC) 10 MG tablet Take  10 mg by mouth daily.     Current Facility-Administered Medications  Medication Dose Route Frequency Provider Last Rate Last Dose  . Influenza vac split quadrivalent PF (FLUARIX) injection 0.5 mL  0.5 mL Intramuscular Once Patrici Ranks, MD        Review of Systems  Constitutional: Negative.  Negative for diaphoresis and weight loss.  HENT: Negative.        Gradual increased swelling of his neck  Eyes: Negative.   Respiratory: Negative.   Cardiovascular: Negative.   Gastrointestinal: Negative.   Genitourinary: Negative.   Musculoskeletal: Negative.  Negative for back pain, joint pain, myalgias and neck pain.  Skin: Negative.   Neurological: Negative.   Endo/Heme/Allergies: Negative.    Psychiatric/Behavioral: Negative.   All other systems reviewed and are negative. 14 point ROS was done and is otherwise as detailed above or in HPI   PHYSICAL EXAMINATION:  ECOG PERFORMANCE STATUS: 0 - Asymptomatic  Vitals:   03/06/16 1302  BP: 118/83  Pulse: 72  Resp: 18  Temp: 98 F (36.7 C)   Filed Weights   03/06/16 1302  Weight: 223 lb 3.2 oz (101.2 kg)   Physical Exam  Constitutional: He is oriented to person, place, and time and well-developed, well-nourished, and in no distress.  HENT:  Head: Normocephalic and atraumatic.  Mouth/Throat: Oropharynx is clear and moist.  Eyes: Conjunctivae and EOM are normal. Pupils are equal, round, and reactive to light.  Neck: Normal range of motion. Neck supple.  Cardiovascular: Normal rate, regular rhythm and normal heart sounds.   Pulmonary/Chest: Effort normal and breath sounds normal.  Abdominal: Soft. Bowel sounds are normal.  Musculoskeletal: Normal range of motion.  Lymphadenopathy:       Head (right side): Submandibular adenopathy present.       Head (left side): Submandibular adenopathy present.    He has cervical adenopathy.    He has axillary adenopathy.       Left: Supraclavicular adenopathy present.  Left supraclavicular has a lot of disease there.  Left lower cervical. Bilateral submandibular. Pretty much all the right cervical regions. Largest right axillary node is about 2.5 cm.  Neurological: He is alert and oriented to person, place, and time. Gait normal.  Skin: Skin is warm and dry.  Nursing note and vitals reviewed.   LABORATORY DATA:  I have reviewed the data as listed Lab Results  Component Value Date   WBC 45.1 (H) 03/06/2016   HGB 15.7 03/06/2016   HCT 44.4 03/06/2016   MCV 92.7 03/06/2016   PLT 91 (L) 03/06/2016     Chemistry      Component Value Date/Time   NA 138 03/06/2016 1229   K 4.2 03/06/2016 1229   CL 106 03/06/2016 1229   CO2 28 03/06/2016 1229   BUN 25 (H) 03/06/2016 1229    CREATININE 1.39 (H) 03/06/2016 1229      Component Value Date/Time   CALCIUM 9.8 03/06/2016 1229   ALKPHOS 44 03/06/2016 1229   AST 23 03/06/2016 1229   ALT 17 03/06/2016 1229   BILITOT 1.2 03/06/2016 1229      ASSESSMENT & PLAN:   CLL/SLL Rai staging, Stage I Mild thrombocytopenia with last platelet count of 113K 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 WBC is  at 45.1 today. Platelets stable. Laboratory studies are noted above.   He received a flu shot today.   The patient has noticed gradual increased swelling in his neck. Last CT  Neck was performed in April 2016. He has increased adenopathy on physical examination. We talked about CLL treatment options at length. Realistically given his increasing adenopathy and platelet count we did discuss that he may need therapy in the near future. We addressed bone marrow biopsy prior to institution of therapy.   Repeat blood counts in 4 weeks. He will return for follow up in 2 months.  All questions were answered. The patient knows to call the clinic with any problems, questions or concerns. I have advised them to write down any questions to bring with them to their next follow-up.   This note was electronically signed.    This document serves as a record of services personally performed by Ancil Linsey, MD. It was created on her behalf by Arlyce Harman, a trained medical scribe. The creation of this record is based on the scribe's personal observations and the provider's statements to them. This document has been checked and approved by the attending provider.  I have reviewed the above documentation for accuracy and completeness, and I agree with the above.  Kelby Fam. Whitney Muse, MD

## 2016-03-06 NOTE — Patient Instructions (Signed)
[place] at [facility] Discharge Instructions  RECOMMENDATIONS MADE BY THE CONSULTANT AND ANY TEST RESULTS WILL BE SENT TO YOUR REFERRING PHYSICIAN.  You were seen today by Dr. Whitney Muse. Return in 4 weeks for labs. Return in 8 weeks for follow up and labs.   Thank you for choosing [place] at [facility] to provide your oncology and hematology care.  To afford each patient quality time with our provider, please arrive at least 15 minutes before your scheduled appointment time.   Beginning January 23rd 2017 lab work for the [facility] will be done in the  Main lab at [facility] on 1st floor. If you have a lab appointment with the [place] please come in thru the  Main Entrance and check in at the main information desk  You need to re-schedule your appointment should you arrive 10 or more minutes late.  We strive to give you quality time with our providers, and arriving late affects you and other patients whose appointments are after yours.  Also, if you no show three or more times for appointments you may be dismissed from the clinic at the providers discretion.     Again, thank you for choosing [facility].  Our hope is that these requests will decrease the amount of time that you wait before being seen by our physicians.       _____________________________________________________________  Should you have questions after your visit to [facility], please contact our office at [phone] between the hours of 8:30 a.m. and 4:30 p.m.  Voicemails left after 4:30 p.m. will not be returned until the following business day.  For prescription refill requests, have your pharmacy contact our office.         Resources For Cancer Patients and their Caregivers ? American Cancer Society: Can assist with transportation, wigs, general needs, runs Look Good Feel Better.        9081840104 ? Cancer  Care: Provides financial assistance, online support groups, medication/co-pay assistance.  1-800-813-HOPE (240)654-9477) ? [facility] Assists [place] cancer patients and their families through emotional , educational and financial support.  606-188-9513 ? [place] DSS Where to apply for food stamps, Medicaid and utility assistance. [phone] ? RCATS: Transportation to medical appointments. 318-140-4476 ? Social Security Administration: May apply for disability if have a Stage IV cancer. 947-667-8475 [phone] ? [employer], Disability and Transit Services: Assists with nutrition, care and transit needs. [place] Support Programs: @10RELATIVEDAYS @ > Cancer Support Group  2nd Tuesday of the month 1pm-2pm, Journey Room  > Creative Journey  3rd Tuesday of the month 1130am-1pm, Journey Room  > Look Good Feel Better  1st Wednesday of the month 10am-12 noon, Journey Room (Call [place] to register [phone])

## 2016-03-19 ENCOUNTER — Encounter (HOSPITAL_COMMUNITY): Payer: Self-pay | Admitting: Hematology & Oncology

## 2016-03-31 ENCOUNTER — Other Ambulatory Visit (HOSPITAL_COMMUNITY): Payer: Self-pay | Admitting: *Deleted

## 2016-03-31 DIAGNOSIS — C9110 Chronic lymphocytic leukemia of B-cell type not having achieved remission: Secondary | ICD-10-CM

## 2016-04-03 ENCOUNTER — Encounter (HOSPITAL_COMMUNITY): Payer: Medicare HMO | Attending: Hematology & Oncology

## 2016-04-03 ENCOUNTER — Other Ambulatory Visit (HOSPITAL_COMMUNITY): Payer: Medicare HMO

## 2016-04-03 DIAGNOSIS — C9110 Chronic lymphocytic leukemia of B-cell type not having achieved remission: Secondary | ICD-10-CM | POA: Diagnosis not present

## 2016-04-03 LAB — CBC WITH DIFFERENTIAL/PLATELET
BASOS ABS: 0 10*3/uL (ref 0.0–0.1)
BASOS PCT: 0 %
EOS ABS: 0 10*3/uL (ref 0.0–0.7)
Eosinophils Relative: 0 %
HCT: 43.2 % (ref 39.0–52.0)
HEMOGLOBIN: 14.9 g/dL (ref 13.0–17.0)
LYMPHS PCT: 90 %
Lymphs Abs: 45.6 10*3/uL — ABNORMAL HIGH (ref 0.7–4.0)
MCH: 32.3 pg (ref 26.0–34.0)
MCHC: 34.5 g/dL (ref 30.0–36.0)
MCV: 93.5 fL (ref 78.0–100.0)
MONO ABS: 1 10*3/uL (ref 0.1–1.0)
Monocytes Relative: 2 %
NEUTROS PCT: 8 %
Neutro Abs: 4.1 10*3/uL (ref 1.7–7.7)
Platelets: 82 10*3/uL — ABNORMAL LOW (ref 150–400)
RBC: 4.62 MIL/uL (ref 4.22–5.81)
RDW: 13 % (ref 11.5–15.5)
WBC: 50.7 10*3/uL (ref 4.0–10.5)

## 2016-04-03 LAB — COMPREHENSIVE METABOLIC PANEL
ALBUMIN: 4.2 g/dL (ref 3.5–5.0)
ALK PHOS: 49 U/L (ref 38–126)
ALT: 18 U/L (ref 17–63)
AST: 24 U/L (ref 15–41)
Anion gap: 7 (ref 5–15)
BUN: 29 mg/dL — ABNORMAL HIGH (ref 6–20)
CALCIUM: 9.6 mg/dL (ref 8.9–10.3)
CO2: 26 mmol/L (ref 22–32)
CREATININE: 1.41 mg/dL — AB (ref 0.61–1.24)
Chloride: 104 mmol/L (ref 101–111)
GFR calc Af Amer: 54 mL/min — ABNORMAL LOW (ref >60)
GFR calc non Af Amer: 47 mL/min — ABNORMAL LOW (ref >60)
GLUCOSE: 158 mg/dL — AB (ref 65–99)
Potassium: 3.9 mmol/L (ref 3.5–5.1)
SODIUM: 137 mmol/L (ref 135–145)
Total Bilirubin: 1.1 mg/dL (ref 0.3–1.2)
Total Protein: 6.4 g/dL — ABNORMAL LOW (ref 6.5–8.1)

## 2016-04-03 LAB — LACTATE DEHYDROGENASE: LDH: 134 U/L (ref 98–192)

## 2016-04-03 NOTE — Progress Notes (Signed)
CRITICAL VALUE ALERT Critical value received:  WBC-50.7  Date of notification:  04/03/16 Time of notification: 0915 Critical value read back:  Yes.   Nurse who received alert:  M.Taysha Majewski, LPN MD notified (1st page):  T.Kefalas, PA-C

## 2016-04-13 DIAGNOSIS — Z6828 Body mass index (BMI) 28.0-28.9, adult: Secondary | ICD-10-CM | POA: Diagnosis not present

## 2016-04-13 DIAGNOSIS — L570 Actinic keratosis: Secondary | ICD-10-CM | POA: Diagnosis not present

## 2016-04-13 DIAGNOSIS — L989 Disorder of the skin and subcutaneous tissue, unspecified: Secondary | ICD-10-CM | POA: Diagnosis not present

## 2016-04-13 DIAGNOSIS — M7989 Other specified soft tissue disorders: Secondary | ICD-10-CM | POA: Diagnosis not present

## 2016-04-27 DIAGNOSIS — X32XXXA Exposure to sunlight, initial encounter: Secondary | ICD-10-CM | POA: Diagnosis not present

## 2016-04-27 DIAGNOSIS — L570 Actinic keratosis: Secondary | ICD-10-CM | POA: Diagnosis not present

## 2016-04-27 DIAGNOSIS — L980 Pyogenic granuloma: Secondary | ICD-10-CM | POA: Diagnosis not present

## 2016-04-28 ENCOUNTER — Other Ambulatory Visit (HOSPITAL_COMMUNITY): Payer: Self-pay

## 2016-04-28 DIAGNOSIS — C9110 Chronic lymphocytic leukemia of B-cell type not having achieved remission: Secondary | ICD-10-CM

## 2016-05-01 ENCOUNTER — Encounter (HOSPITAL_COMMUNITY): Payer: Medicare HMO | Attending: Oncology | Admitting: Oncology

## 2016-05-01 ENCOUNTER — Encounter (HOSPITAL_COMMUNITY): Payer: Self-pay | Admitting: Oncology

## 2016-05-01 ENCOUNTER — Encounter (HOSPITAL_COMMUNITY): Payer: Medicare HMO

## 2016-05-01 DIAGNOSIS — C9110 Chronic lymphocytic leukemia of B-cell type not having achieved remission: Secondary | ICD-10-CM

## 2016-05-01 DIAGNOSIS — Z87891 Personal history of nicotine dependence: Secondary | ICD-10-CM | POA: Diagnosis not present

## 2016-05-01 DIAGNOSIS — R591 Generalized enlarged lymph nodes: Secondary | ICD-10-CM | POA: Insufficient documentation

## 2016-05-01 LAB — CBC WITH DIFFERENTIAL/PLATELET
BASOS ABS: 0.1 10*3/uL (ref 0.0–0.1)
BASOS PCT: 0 %
Eosinophils Absolute: 0.3 10*3/uL (ref 0.0–0.7)
Eosinophils Relative: 0 %
HCT: 44.3 % (ref 39.0–52.0)
HEMOGLOBIN: 15.4 g/dL (ref 13.0–17.0)
LYMPHS PCT: 90 %
Lymphs Abs: 50.9 10*3/uL — ABNORMAL HIGH (ref 0.7–4.0)
MCH: 32.5 pg (ref 26.0–34.0)
MCHC: 34.8 g/dL (ref 30.0–36.0)
MCV: 93.5 fL (ref 78.0–100.0)
Monocytes Absolute: 0.6 10*3/uL (ref 0.1–1.0)
Monocytes Relative: 1 %
NEUTROS ABS: 4.5 10*3/uL (ref 1.7–7.7)
NEUTROS PCT: 8 %
Platelets: 81 10*3/uL — ABNORMAL LOW (ref 150–400)
RBC: 4.74 MIL/uL (ref 4.22–5.81)
RDW: 13.1 % (ref 11.5–15.5)
WBC: 56.4 10*3/uL — AB (ref 4.0–10.5)

## 2016-05-01 LAB — LACTATE DEHYDROGENASE: LDH: 175 U/L (ref 98–192)

## 2016-05-01 LAB — COMPREHENSIVE METABOLIC PANEL
ALBUMIN: 4.4 g/dL (ref 3.5–5.0)
ALK PHOS: 50 U/L (ref 38–126)
ALT: 20 U/L (ref 17–63)
ANION GAP: 8 (ref 5–15)
AST: 30 U/L (ref 15–41)
BUN: 24 mg/dL — AB (ref 6–20)
CALCIUM: 10.1 mg/dL (ref 8.9–10.3)
CO2: 26 mmol/L (ref 22–32)
Chloride: 106 mmol/L (ref 101–111)
Creatinine, Ser: 1.44 mg/dL — ABNORMAL HIGH (ref 0.61–1.24)
GFR calc Af Amer: 53 mL/min — ABNORMAL LOW (ref >60)
GFR calc non Af Amer: 46 mL/min — ABNORMAL LOW (ref >60)
GLUCOSE: 134 mg/dL — AB (ref 65–99)
Potassium: 3.8 mmol/L (ref 3.5–5.1)
SODIUM: 140 mmol/L (ref 135–145)
Total Bilirubin: 1 mg/dL (ref 0.3–1.2)
Total Protein: 6.8 g/dL (ref 6.5–8.1)

## 2016-05-01 NOTE — Progress Notes (Signed)
Wende Neighbors, MD [place] [address]  CLL (chronic lymphocytic leukemia) [facility]) - Plan: CBC with Differential, Comprehensive metabolic panel, Lactate dehydrogenase  CURRENT THERAPY: Close observation  INTERVAL HISTORY: Andrew [name] 75 y.o. male returns for followup of CLL with palpable lymphadenopathy and ZAP 70 at 7%.   He is doing well.  He notes that his appetite is good, denies fevers or chills, and denies any night sweats.  He does not have any documented weight loss.  He denies any fatigue.  He denies any neck pain or any new pain for that matter.  Review of Systems  Constitutional: Negative.  Negative for chills, fever, malaise/fatigue and weight loss.  HENT: Negative.   Eyes: Negative.   Respiratory: Negative.   Cardiovascular: Negative.   Gastrointestinal: Negative.   Genitourinary: Negative.   Musculoskeletal: Negative.   Skin: Negative.  Negative for itching and rash.  Neurological: Negative.  Negative for weakness.  Endo/Heme/Allergies: Negative.   Psychiatric/Behavioral: Negative.     Past Medical History:  Diagnosis Date  . Dysrhythmia    Hx. of Atrial Fibrillation- has resolved  . Hypertension   . Pinched nerve in neck     Past Surgical History:  Procedure Laterality Date  . CATARACT EXTRACTION, BILATERAL    . COLONOSCOPY  07/10/2011   Procedure: COLONOSCOPY;  Surgeon: Dorothyann Peng, MD;  Location[facility];  Service: Endoscopy;  Laterality: N/A;  10:30 AM  . THYROIDECTOMY, PARTIAL    . TONSILLECTOMY      Family History  Problem Relation Age of Onset  . Colon cancer Neg Hx     Social History   Social History  . Marital status: Married    Spouse name: N/A  . Number of children: N/A  . Years of education: N/A   Social History Main Topics  . Smoking status: Former Smoker    Packs/day: 1.00    Years: 15.00  . Smokeless tobacco: Never Used  . Alcohol use No  . Drug use: No  . Sexual activity: Not Asked    Other Topics Concern  . None   Social History Narrative  . None     PHYSICAL EXAMINATION  ECOG PERFORMANCE STATUS: 0 - Asymptomatic  Vitals:   05/01/16 1428  BP: 104/68  Pulse: 88  Resp: 16  Temp: 97.7 F (36.5 C)    GENERAL:alert, no distress, well nourished, well developed, comfortable, cooperative, smiling and accompanied by his wife. SKIN: skin color, texture, turgor are normal, no rashes or significant lesions HEAD: Normocephalic, No masses, lesions, tenderness or abnormalities EYES: normal, EOMI, Conjunctiva are pink and non-injected EARS: External ears normal.  B/L TM pearly white without any erythema or cerumen impaction. OROPHARYNX:lips, buccal mucosa, and tongue normal and mucous membranes are moist  NECK: supple, trachea midline, see lymph exam LYMPH:  Left supraclavicular lymph node measuring about 2-2.5 cm, left anterior cervical chain lymph node about 1.5 cm, right medial supraclavicular lymph node measuring 1 cm, right submandibular lymph node measuring 4-5 cm and a inferoposterior lymph node measuring 1.5 cm, and submental lymph node about 1.5-2 cm. BREAST:not examined LUNGS: clear to auscultation and percussion HEART: regular rate & rhythm, no murmurs, no gallops, S1 normal and S2 normal ABDOMEN:abdomen soft, non-tender and normal bowel sounds BACK: Back symmetric, no curvature., No CVA tenderness EXTREMITIES:less then 2 second capillary refill, no joint deformities, effusion, or inflammation, no skin discoloration  NEURO: alert & oriented x 3 with  fluent speech, no focal motor/sensory deficits, gait normal    LABORATORY DATA: CBC    Component Value Date/Time   WBC 56.4 (HH) 05/01/2016 1405   RBC 4.74 05/01/2016 1405   HGB 15.4 05/01/2016 1405   HCT 44.3 05/01/2016 1405   PLT 81 (L) 05/01/2016 1405   MCV 93.5 05/01/2016 1405   MCH 32.5 05/01/2016 1405   MCHC 34.8 05/01/2016 1405   RDW 13.1 05/01/2016 1405   LYMPHSABS 50.9 (H) 05/01/2016 1405    MONOABS 0.6 05/01/2016 1405   EOSABS 0.3 05/01/2016 1405   BASOSABS 0.1 05/01/2016 1405      Chemistry      Component Value Date/Time   NA 140 05/01/2016 1405   K 3.8 05/01/2016 1405   CL 106 05/01/2016 1405   CO2 26 05/01/2016 1405   BUN 24 (H) 05/01/2016 1405   CREATININE 1.44 (H) 05/01/2016 1405      Component Value Date/Time   CALCIUM 10.1 05/01/2016 1405   ALKPHOS 50 05/01/2016 1405   AST 30 05/01/2016 1405   ALT 20 05/01/2016 1405   BILITOT 1.0 05/01/2016 1405        PENDING LABS:   RADIOGRAPHIC STUDIES:  No results found.   PATHOLOGY:    ASSESSMENT AND PLAN:  CLL (chronic lymphocytic leukemia) ([place]) CLL with palpable lymphadenopathy and ZAP 70 at 7%.   Labs today: CBC diff, CMET, LDH.  I personally reviewed and went over laboratory results with the patient.  The results are noted within this dictation.  Labs monthly: CBC diff, CMET, LDH.  Given his enlarging lymphadenopathy, need for treatment is likely needed in the near future.  Prior to initiation of treatment, he will need CT CAP and neck imaging and bone marrow aspiration and biopsy.  Then he will need chemotherapy teaching for [place].  Return in 2 months for follow-up   ORDERS PLACED FOR THIS ENCOUNTER: Orders Placed This Encounter  Procedures  . CBC with Differential  . Comprehensive metabolic panel  . Lactate dehydrogenase    MEDICATIONS PRESCRIBED THIS ENCOUNTER: No orders of the defined types were placed in this encounter.   THERAPY PLAN:  NCCN guidelines for indication for treatment of CLL are:  A. Eligible for clinical trial  B. Significant disease-related symptoms   1. Fatigue (severe)   2. Night sweats   3. Weight loss   4. Fever without infection  C. Threatened end-organ function  D. Progressive bulky disease (spleen >6cm below costal margin, lymph nodes >10 cm)  E. Progressive anemia  F. Progressive thrombocytopenia.   All questions were answered. The patient knows  to call the clinic with any problems, questions or concerns. We can certainly see the patient much sooner if necessary.  Patient and plan discussed with Dr. Ancil Linsey and she is in agreement with the aforementioned.   This note is electronically signed by: Doy Mince 05/01/2016 3:15 PM

## 2016-05-01 NOTE — Progress Notes (Signed)
CRITICAL VALUE ALERT Critical value received:  WBC 56.4 Date of notification:  05/01/16 Time of notification: 9323 Critical value read back:  Yes.   Nurse who received alert:  M.Prudence Heiny, LPN  MD notified (1st page):  T.Kefalas, PA-C

## 2016-05-01 NOTE — Assessment & Plan Note (Addendum)
CLL with palpable lymphadenopathy and ZAP 70 at 7%.   Labs today: CBC diff, CMET, LDH.  I personally reviewed and went over laboratory results with the patient.  The results are noted within this dictation.  Labs monthly: CBC diff, CMET, LDH.  Given his enlarging lymphadenopathy, need for treatment is likely needed in the near future.  Prior to initiation of treatment, he will need CT CAP and neck imaging and bone marrow aspiration and biopsy.  Then he will need chemotherapy teaching for [place].  Return in 2 months for follow-up  More than 50% of the time spent with the patient was utilized for counseling and coordination of care.

## 2016-05-01 NOTE — Patient Instructions (Signed)
[place] at [facility] Discharge Instructions  RECOMMENDATIONS MADE BY THE CONSULTANT AND ANY TEST RESULTS WILL BE SENT TO YOUR REFERRING PHYSICIAN.  Exam with Robynn Pane, PA. We will continue doing labs monthly.   Return in 2 months for follow up.  Please see Amy as you leave for appointments.    Thank you for choosing [place] at [facility] to provide your oncology and hematology care.  To afford each patient quality time with our provider, please arrive at least 15 minutes before your scheduled appointment time.   Beginning January 23rd 2017 lab work for the [facility] will be done in the  Main lab at [facility] on 1st floor. If you have a lab appointment with the [facility] please come in thru the  Main Entrance and check in at the main information desk  You need to re-schedule your appointment should you arrive 10 or more minutes late.  We strive to give you quality time with our providers, and arriving late affects you and other patients whose appointments are after yours.  Also, if you no show three or more times for appointments you may be dismissed from the clinic at the providers discretion.     Again, thank you for choosing [facility].  Our hope is that these requests will decrease the amount of time that you wait before being seen by our physicians.       _____________________________________________________________  Should you have questions after your visit to [facility], please contact our office at [phone] between the hours of 8:30 a.m. and 4:30 p.m.  Voicemails left after 4:30 p.m. will not be returned until the following business day.  For prescription refill requests, have your pharmacy contact our office.         Resources For Cancer Patients and their Caregivers ? American Cancer Society: Can assist with transportation, wigs, general needs, runs Look Good Feel Better.         234-867-6580 ? Cancer Care: Provides financial assistance, online support groups, medication/co-pay assistance.  1-800-813-HOPE 770-079-4698) ? [place] Assists [place] cancer patients and their families through emotional , educational and financial support.  203 014 1917 ? [place] DSS Where to apply for food stamps, Medicaid and utility assistance. (231)326-3506 ? [facility]: Transportation to medical appointments. 919-707-8839 ? Social Security Administration: May apply for disability if have a Stage IV cancer. 5740597026 269-007-3728 ? [employer], Disability and Transit Services: Assists with nutrition, care and transit needs. [place] Support Programs: @10RELATIVEDAYS @ > Cancer Support Group  2nd Tuesday of the month 1pm-2pm, Journey Room  > Creative Journey  3rd Tuesday of the month 1130am-1pm, Journey Room  > Look Good Feel Better  1st Wednesday of the month 10am-12 noon, Journey Room (Call [facility] to register [phone])

## 2016-05-13 ENCOUNTER — Emergency Department (HOSPITAL_COMMUNITY): Payer: Medicare HMO

## 2016-05-13 ENCOUNTER — Encounter (HOSPITAL_COMMUNITY): Payer: Self-pay | Admitting: Emergency Medicine

## 2016-05-13 ENCOUNTER — Observation Stay (HOSPITAL_COMMUNITY)
Admission: EM | Admit: 2016-05-13 | Discharge: 2016-05-13 | Disposition: A | Discharge status: Home / Self Care | Payer: Medicare HMO | Attending: Internal Medicine | Admitting: Internal Medicine

## 2016-05-13 DIAGNOSIS — I10 Essential (primary) hypertension: Secondary | ICD-10-CM | POA: Diagnosis not present

## 2016-05-13 DIAGNOSIS — J069 Acute upper respiratory infection, unspecified: Secondary | ICD-10-CM | POA: Diagnosis not present

## 2016-05-13 DIAGNOSIS — I959 Hypotension, unspecified: Secondary | ICD-10-CM | POA: Diagnosis not present

## 2016-05-13 DIAGNOSIS — Z7982 Long term (current) use of aspirin: Secondary | ICD-10-CM | POA: Diagnosis not present

## 2016-05-13 DIAGNOSIS — Z87891 Personal history of nicotine dependence: Secondary | ICD-10-CM | POA: Insufficient documentation

## 2016-05-13 DIAGNOSIS — J029 Acute pharyngitis, unspecified: Secondary | ICD-10-CM | POA: Diagnosis not present

## 2016-05-13 DIAGNOSIS — R0902 Hypoxemia: Secondary | ICD-10-CM

## 2016-05-13 DIAGNOSIS — R05 Cough: Secondary | ICD-10-CM | POA: Diagnosis not present

## 2016-05-13 DIAGNOSIS — A419 Sepsis, unspecified organism: Principal | ICD-10-CM | POA: Insufficient documentation

## 2016-05-13 DIAGNOSIS — C9110 Chronic lymphocytic leukemia of B-cell type not having achieved remission: Secondary | ICD-10-CM | POA: Diagnosis not present

## 2016-05-13 HISTORY — DX: Chronic lymphocytic leukemia of B-cell type not having achieved remission: C91.10

## 2016-05-13 LAB — URINALYSIS, ROUTINE W REFLEX MICROSCOPIC
BILIRUBIN URINE: NEGATIVE
Bacteria, UA: NONE SEEN
Glucose, UA: NEGATIVE mg/dL
Ketones, ur: NEGATIVE mg/dL
LEUKOCYTES UA: NEGATIVE
NITRITE: NEGATIVE
Protein, ur: NEGATIVE mg/dL
SPECIFIC GRAVITY, URINE: 1.025 (ref 1.005–1.030)
pH: 5 (ref 5.0–8.0)

## 2016-05-13 LAB — COMPREHENSIVE METABOLIC PANEL
ALBUMIN: 4 g/dL (ref 3.5–5.0)
ALK PHOS: 62 U/L (ref 38–126)
ALT: 19 U/L (ref 17–63)
AST: 28 U/L (ref 15–41)
Anion gap: 9 (ref 5–15)
BILIRUBIN TOTAL: 1.8 mg/dL — AB (ref 0.3–1.2)
BUN: 21 mg/dL — AB (ref 6–20)
CALCIUM: 9.1 mg/dL (ref 8.9–10.3)
CO2: 24 mmol/L (ref 22–32)
Chloride: 107 mmol/L (ref 101–111)
Creatinine, Ser: 1.39 mg/dL — ABNORMAL HIGH (ref 0.61–1.24)
GFR calc Af Amer: 55 mL/min — ABNORMAL LOW (ref >60)
GFR, EST NON AFRICAN AMERICAN: 48 mL/min — AB (ref >60)
GLUCOSE: 120 mg/dL — AB (ref 65–99)
Potassium: 3.4 mmol/L — ABNORMAL LOW (ref 3.5–5.1)
Sodium: 140 mmol/L (ref 135–145)
Total Protein: 6.6 g/dL (ref 6.5–8.1)

## 2016-05-13 LAB — CBC WITH DIFFERENTIAL/PLATELET
BASOS ABS: 0 10*3/uL (ref 0.0–0.1)
Basophils Relative: 0 %
EOS ABS: 0 10*3/uL (ref 0.0–0.7)
Eosinophils Relative: 0 %
HCT: 42.2 % (ref 39.0–52.0)
Hemoglobin: 14.5 g/dL (ref 13.0–17.0)
LYMPHS ABS: 41.9 10*3/uL — AB (ref 0.7–4.0)
Lymphocytes Relative: 82 %
MCH: 32.4 pg (ref 26.0–34.0)
MCHC: 34.4 g/dL (ref 30.0–36.0)
MCV: 94.4 fL (ref 78.0–100.0)
MONO ABS: 1 10*3/uL (ref 0.1–1.0)
Monocytes Relative: 2 %
Neutro Abs: 8.2 10*3/uL — ABNORMAL HIGH (ref 1.7–7.7)
Neutrophils Relative %: 16 %
PLATELETS: 88 10*3/uL — AB (ref 150–400)
RBC: 4.47 MIL/uL (ref 4.22–5.81)
RDW: 12.9 % (ref 11.5–15.5)
WBC: 51.1 10*3/uL — AB (ref 4.0–10.5)

## 2016-05-13 LAB — I-STAT CG4 LACTIC ACID, ED
Lactic Acid, Venous: 0.83 mmol/L (ref 0.5–1.9)
Lactic Acid, Venous: 0.71 mmol/L (ref 0.5–1.9)

## 2016-05-13 LAB — INFLUENZA PANEL BY PCR (TYPE A & B)
INFLAPCR: NEGATIVE
Influenza B By PCR: NEGATIVE

## 2016-05-13 MED ORDER — SODIUM CHLORIDE 0.9 % IV BOLUS (SEPSIS)
1000.0000 mL | Freq: Once | INTRAVENOUS | Status: AC
  Administered 2016-05-13: 1000 mL via INTRAVENOUS

## 2016-05-13 MED ORDER — ALBUTEROL (5 MG/ML) CONTINUOUS INHALATION SOLN
10.0000 mg/h | INHALATION_SOLUTION | RESPIRATORY_TRACT | Status: AC
  Administered 2016-05-13: 10 mg/h via RESPIRATORY_TRACT
  Filled 2016-05-13: qty 20

## 2016-05-13 MED ORDER — DEXTROSE 5 % IV SOLN
1.0000 g | Freq: Once | INTRAVENOUS | Status: AC
  Administered 2016-05-13: 1 g via INTRAVENOUS
  Filled 2016-05-13: qty 10

## 2016-05-13 MED ORDER — AZITHROMYCIN 250 MG PO TABS
500.0000 mg | ORAL_TABLET | Freq: Once | ORAL | Status: AC
  Administered 2016-05-13: 500 mg via ORAL
  Filled 2016-05-13: qty 2

## 2016-05-13 NOTE — Consult Note (Signed)
Requesting physician: Noemi Chapel, EDP  Primary Care Physician: Wende Neighbors, MD  Reason for consultation: Potential admission   History of Present Illness: 75 y/o man with h/o CLL here with URI symptoms for the past 2 days: runny nose, sore throat, myalgias, subjective fevers, chills. W/u in ED initially significant for hypotension and hypoxemia. Flu PCR negative. CXR without acute abnormalities. Admission requested.  Allergies:  No Known Allergies    Past Medical History:  Diagnosis Date  . CLL (chronic lymphocytic leukemia) ([place])   . Dysrhythmia    Hx. of Atrial Fibrillation- has resolved  . Hypertension   . Pinched nerve in neck     Past Surgical History:  Procedure Laterality Date  . CATARACT EXTRACTION, BILATERAL    . COLONOSCOPY  07/10/2011   Procedure: COLONOSCOPY;  Surgeon: Dorothyann Peng, MD;  Location[facility];  Service: Endoscopy;  Laterality: N/A;  10:30 AM  . THYROIDECTOMY, PARTIAL    . TONSILLECTOMY      Scheduled Meds: Continuous Infusions: PRN Meds:.  Social History:  reports that he has quit smoking. He has a 15.00 pack-year smoking history. He has never used smokeless tobacco. He reports that he does not drink alcohol or use drugs.  Family History  Problem Relation Age of Onset  . Colon cancer Neg Hx     Review of Systems:  Constitutional: Denies fever, chills, diaphoresis, appetite change and fatigue.  HEENT: Denies photophobia, eye pain, redness, hearing loss, ear pain, congestion, sore throat, rhinorrhea, sneezing, mouth sores, trouble swallowing, neck pain, neck stiffness and tinnitus.   Respiratory: Denies SOB, DOE, cough, chest tightness,  and wheezing.   Cardiovascular: Denies chest pain, palpitations and leg swelling.  Gastrointestinal: Denies nausea, vomiting, abdominal pain, diarrhea, constipation, blood in stool and abdominal distention.  Genitourinary: Denies dysuria, urgency, frequency, hematuria, flank pain and difficulty  urinating.  Endocrine: Denies: hot or cold intolerance, sweats, changes in hair or nails, polyuria, polydipsia. Musculoskeletal: Denies myalgias, back pain, joint swelling, arthralgias and gait problem.  Skin: Denies pallor, rash and wound.  Neurological: Denies dizziness, seizures, syncope, weakness, light-headedness, numbness and headaches.  Hematological: Denies adenopathy. Easy bruising, personal or family bleeding history  Psychiatric/Behavioral: Denies suicidal ideation, mood changes, confusion, nervousness, sleep disturbance and agitation   Physical Exam: Blood pressure 147/93, pulse 84, temperature 97.4 F (36.3 C), temperature source Oral, resp. rate (!) 33, height '6\' 2"'  (1.88 m), weight 98.9 kg (218 lb), SpO2 94 %.  Gen: AA Ox3, NAD HEENT: [place]/AT/, PERRLA CV: RRR, no M/R/G Lungs: CTA B Abd: S/NT/ND/+BS Ext: no C/C/E Neuro: intact and non-focal  Labs on Admission:  Results for orders placed or performed during the hospital encounter of 05/13/16 (from the past 48 hour(s))  Comprehensive metabolic panel     Status: Abnormal   Collection Time: 05/13/16 12:12 PM  Result Value Ref Range   Sodium 140 135 - 145 mmol/L   Potassium 3.4 (L) 3.5 - 5.1 mmol/L   Chloride 107 101 - 111 mmol/L   CO2 24 22 - 32 mmol/L   Glucose, Bld 120 (H) 65 - 99 mg/dL   BUN 21 (H) 6 - 20 mg/dL   Creatinine, Ser 1.39 (H) 0.61 - 1.24 mg/dL   Calcium 9.1 8.9 - 10.3 mg/dL   Total Protein 6.6 6.5 - 8.1 g/dL   Albumin 4.0 3.5 - 5.0 g/dL   AST 28 15 - 41 U/L   ALT 19 17 - 63 U/L   Alkaline Phosphatase 62 38 -  126 U/L   Total Bilirubin 1.8 (H) 0.3 - 1.2 mg/dL   GFR calc non Af Amer 48 (L) >60 mL/min   GFR calc Af Amer 55 (L) >60 mL/min    Comment: (NOTE) The eGFR has been calculated using the CKD EPI equation. This calculation has not been validated in all clinical situations. eGFR's persistently <60 mL/min signify possible Chronic Kidney Disease.    Anion gap 9 5 - 15  CBC WITH DIFFERENTIAL      Status: Abnormal   Collection Time: 05/13/16 12:12 PM  Result Value Ref Range   WBC 51.1 (HH) 4.0 - 10.5 K/uL    Comment: CRITICAL RESULT CALLED TO, READ BACK BY AND VERIFIED WITH: KNOWLES,R. AT 1301 ON 05/13/2016 BY EVA    RBC 4.47 4.22 - 5.81 MIL/uL   Hemoglobin 14.5 13.0 - 17.0 g/dL   HCT 42.2 39.0 - 52.0 %   MCV 94.4 78.0 - 100.0 fL   MCH 32.4 26.0 - 34.0 pg   MCHC 34.4 30.0 - 36.0 g/dL   RDW 12.9 11.5 - 15.5 %   Platelets 88 (L) 150 - 400 K/uL   Neutrophils Relative % 16 %   Lymphocytes Relative 82 %   Monocytes Relative 2 %   Eosinophils Relative 0 %   Basophils Relative 0 %   Neutro Abs 8.2 (H) 1.7 - 7.7 K/uL   Lymphs Abs 41.9 (H) 0.7 - 4.0 K/uL   Monocytes Absolute 1.0 0.1 - 1.0 K/uL   Eosinophils Absolute 0.0 0.0 - 0.7 K/uL   Basophils Absolute 0.0 0.0 - 0.1 K/uL   WBC Morphology ATYPICAL LYMPHOCYTES     Comment: SMUDGE CELLS ABSOLUTE LYMPHOCYTOSIS   Blood Culture (routine x 2)     Status: None (Preliminary result)   Collection Time: 05/13/16 12:12 PM  Result Value Ref Range   Specimen Description BLOOD RIGHT ARM IV    Special Requests BOTTLES DRAWN AEROBIC AND ANAEROBIC 8CC    Culture PENDING    Report Status PENDING   Urinalysis, Routine w reflex microscopic     Status: Abnormal   Collection Time: 05/13/16 12:12 PM  Result Value Ref Range   Color, Urine YELLOW YELLOW   APPearance CLEAR CLEAR   Specific Gravity, Urine 1.025 1.005 - 1.030   pH 5.0 5.0 - 8.0   Glucose, UA NEGATIVE NEGATIVE mg/dL   Hgb urine dipstick LARGE (A) NEGATIVE   Bilirubin Urine NEGATIVE NEGATIVE   Ketones, ur NEGATIVE NEGATIVE mg/dL   Protein, ur NEGATIVE NEGATIVE mg/dL   Nitrite NEGATIVE NEGATIVE   Leukocytes, UA NEGATIVE NEGATIVE   RBC / HPF TOO NUMEROUS TO COUNT 0 - 5 RBC/hpf   WBC, UA 6-30 0 - 5 WBC/hpf   Bacteria, UA NONE SEEN NONE SEEN   Mucous PRESENT   Influenza panel by PCR (type A & B, H1N1)     Status: None   Collection Time: 05/13/16 12:12 PM  Result Value Ref Range     Influenza A By PCR NEGATIVE NEGATIVE   Influenza B By PCR NEGATIVE NEGATIVE    Comment: (NOTE) The Xpert Xpress Flu assay is intended as an aid in the diagnosis of  influenza and should not be used as a sole basis for treatment.  This  assay is FDA approved for nasopharyngeal swab specimens only. Nasal  washings and aspirates are unacceptable for Xpert Xpress Flu testing.   Blood Culture (routine x 2)     Status: None (Preliminary result)   Collection Time: 05/13/16  12:17 PM  Result Value Ref Range   Specimen Description BLOOD LEFT ANTECUBITAL    Special Requests BOTTLES DRAWN AEROBIC AND ANAEROBIC 8CC    Culture PENDING    Report Status PENDING   I-Stat CG4 Lactic Acid, ED  (not at  [facility])     Status: None   Collection Time: 05/13/16 12:34 PM  Result Value Ref Range   Lactic Acid, Venous 0.83 0.5 - 1.9 mmol/L  I-Stat CG4 Lactic Acid, ED  (not at  [facility])     Status: None   Collection Time: 05/13/16  3:34 PM  Result Value Ref Range   Lactic Acid, Venous 0.71 0.5 - 1.9 mmol/L    Radiological Exams on Admission: Dg Chest 2 View  Result Date: 05/13/2016 CLINICAL DATA:  Cough, fever and hypotension. EXAM: CHEST  2 VIEW COMPARISON:  Chest CT dated 09/10/2014. FINDINGS: Normal sized heart. Tortuous and mildly calcified thoracic aorta. Enlarged central pulmonary arteries. Mild hyperexpansion of the lungs with mildly prominent interstitial markings. Mild thoracic spine degenerative changes. IMPRESSION: No acute abnormality. 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 Electronically Signed   By: Claudie Revering M.D.   On: 05/13/2016 13:20    Assessment/Plan Principal Problem:   URI (upper respiratory infection) Active Problems:   Hypoxemia  URI/Hypoxemia/Hypotension -Most likely viral infection. -Patient ended up having a prolonged stay in the ED due to lack of beds, in that time frame his BP has normalized with IVF, he is no longer requiring oxygen and is  walking around his room without complaints. He would like to be discharged home. -I think this is reasonable. Have advised OTC meds like claritin and tylenol, ibuprofen if needed. -Have recommended that he hold BP meds for 1 week until he sees his PCP, Dr. Nevada Crane, in the office.   Time Spent on Consultation: 75 minutes  HERNANDEZ ACOSTA,ESTELA [facility]  [phone] 05/13/2016, 5:55 PM

## 2016-05-13 NOTE — ED Notes (Signed)
Spoke with Dr Jerilee Hoh and she requests I hold pt in ED until she evaluates

## 2016-05-13 NOTE — ED Notes (Signed)
Pt transported to [facility]

## 2016-05-13 NOTE — ED Provider Notes (Signed)
[facility] DEPT Provider Note   CSN: [id number] Arrival date & time: 05/13/16  1030  By signing my name below, I, Sonum Patel, attest that this documentation has been prepared under the direction and in the presence of Noemi Chapel, MD. Electronically Signed: Sonum Patel, Education administrator. 05/13/16. 12:30 PM.  History   Chief Complaint Chief Complaint  Patient presents with  . Fever    The history is provided by the patient and the spouse. No language interpreter was used.     HPI Comments: Andrew [name] is a 74 y.o. male with past medical history of HTN who presents to the Emergency Department complaining of intermittent fever and chills that began last night. He reports associated generalized myalgia that began this morning, a sore throat that began 2 days ago and a dry cough. He reports sick contacts with grandson. He denies nausea, vomiting, diarrhea, abdominal pain, rhinorrhea, congestion. He has a history of CLL but is not currently undergoing treatment; states he has stable blood tests related to this. He denies a smoking history. He denies history of DM, HLD, or cardiac disease. He denies history of asthma or bronchitis.  Past Medical History:  Diagnosis Date  . CLL (chronic lymphocytic leukemia) ([place])   . Dysrhythmia    Hx. of Atrial Fibrillation- has resolved  . Hypertension   . Pinched nerve in neck     Patient Active Problem List   Diagnosis Date Noted  . Sepsis ([place]) 05/13/2016  . Preventative health care 03/06/2016  . CLL (chronic lymphocytic leukemia) ([place]) 03/09/2015    Past Surgical History:  Procedure Laterality Date  . CATARACT EXTRACTION, BILATERAL    . COLONOSCOPY  07/10/2011   Procedure: COLONOSCOPY;  Surgeon: Dorothyann Peng, MD;  Location: [facility];  Service: Endoscopy;  Laterality: N/A;  10:30 AM  . THYROIDECTOMY, PARTIAL    . TONSILLECTOMY         Home Medications    Prior to Admission medications   Medication Sig Start Date End Date Taking?  Authorizing Provider  amLODipine (NORVASC) 10 MG tablet Take 10 mg by mouth daily. 12/21/15  Yes Historical Provider, MD  aspirin 325 MG tablet Take 325 mg by mouth daily.   Yes Historical Provider, MD  carvedilol (COREG) 12.5 MG tablet Take 12.5 mg by mouth 2 (two) times daily with a meal.   Yes Historical Provider, MD  Coenzyme Q10 (COQ10) 100 MG CAPS Take 1 tablet by mouth daily.   Yes Historical Provider, MD  fish oil-omega-3 fatty acids 1000 MG capsule Take 1 g by mouth 2 (two) times daily.   Yes Historical Provider, MD  Multiple Vitamin (MULITIVITAMIN WITH MINERALS) TABS Take 1 tablet by mouth daily.   Yes Historical Provider, MD  telmisartan-hydrochlorothiazide (MICARDIS HCT) 80-25 MG tablet Take 1 tablet by mouth daily.  12/28/15  Yes Historical Provider, MD    Family History Family History  Problem Relation Age of Onset  . Colon cancer Neg Hx     Social History Social History  Substance Use Topics  . Smoking status: Former Smoker    Packs/day: 1.00    Years: 15.00  . Smokeless tobacco: Never Used  . Alcohol use No     Allergies   Patient has no known allergies.   Review of Systems Review of Systems  Constitutional: Positive for chills and fever.  HENT: Positive for sore throat. Negative for congestion and rhinorrhea.   Respiratory: Positive for cough.   Gastrointestinal: Negative for abdominal pain, diarrhea,  nausea and vomiting.  Musculoskeletal: Positive for myalgias.  All other systems reviewed and are negative.    Physical Exam Updated Vital Signs BP 129/88   Pulse 84   Temp 98.9 F (37.2 C) (Oral)   Resp (!) 32   Ht 6\' 2"  (1.88 m)   Wt 218 lb (98.9 kg)   SpO2 93%   BMI 27.99 kg/m   Physical Exam  Constitutional: He appears well-developed and well-nourished. No distress.  HENT:  Head: Normocephalic and atraumatic.  Right Ear: External ear normal.  Left Ear: External ear normal.  Nose: Nose normal.  Mouth/Throat: Oropharynx is clear and moist. No  oropharyngeal exudate.  Eyes: Conjunctivae and EOM are normal. Pupils are equal, round, and reactive to light. Right eye exhibits no discharge. Left eye exhibits no discharge. No scleral icterus.  Neck: Normal range of motion. Neck supple. No JVD present. No thyromegaly present.  Anterior and posterior cervical lymphadenopathy   Cardiovascular: Normal rate, regular rhythm, normal heart sounds and intact distal pulses.  Exam reveals no gallop and no friction rub.   No murmur heard. Pulmonary/Chest: Effort normal. No respiratory distress. He has wheezes. He has rhonchi. He has no rales.  Rhonchi diffusely and expiratory wheezing bilaterally   Abdominal: Soft. Bowel sounds are normal. He exhibits no distension and no mass. There is tenderness (mild, LLQ).  Musculoskeletal: Normal range of motion. He exhibits no edema or tenderness.  Lymphadenopathy:    He has cervical adenopathy.  Neurological: He is alert. Coordination normal.  Skin: Skin is warm and dry. No rash noted. No erythema.  Psychiatric: He has a normal mood and affect. His behavior is normal.  Nursing note and vitals reviewed.    ED Treatments / Results  DIAGNOSTIC STUDIES: Oxygen Saturation is 93% on RA, adequate by my interpretation.    COORDINATION OF CARE: 12:15 PM Discussed treatment plan with pt at bedside and pt agreed to plan.   Labs (all labs ordered are listed, but only abnormal results are displayed) Labs Reviewed  COMPREHENSIVE METABOLIC PANEL - Abnormal; Notable for the following:       Result Value   Potassium 3.4 (*)    Glucose, Bld 120 (*)    BUN 21 (*)    Creatinine, Ser 1.39 (*)    Total Bilirubin 1.8 (*)    GFR calc non Af Amer 48 (*)    GFR calc Af Amer 55 (*)    All other components within normal limits  CBC WITH DIFFERENTIAL/PLATELET - Abnormal; Notable for the following:    WBC 51.1 (*)    Platelets 88 (*)    Neutro Abs 8.2 (*)    Lymphs Abs 41.9 (*)    All other components within normal  limits  URINALYSIS, ROUTINE W REFLEX MICROSCOPIC - Abnormal; Notable for the following:    Hgb urine dipstick LARGE (*)    All other components within normal limits  CULTURE, BLOOD (ROUTINE X 2)  CULTURE, BLOOD (ROUTINE X 2)  URINE CULTURE  INFLUENZA PANEL BY PCR (TYPE A & B, H1N1)  I-STAT CG4 LACTIC ACID, ED  I-STAT CG4 LACTIC ACID, ED    EKG  EKG Interpretation None       Radiology Dg Chest 2 View  Result Date: 05/13/2016 CLINICAL DATA:  Cough, fever and hypotension. EXAM: CHEST  2 VIEW COMPARISON:  Chest CT dated 09/10/2014. FINDINGS: Normal sized heart. Tortuous and mildly calcified thoracic aorta. Enlarged central pulmonary arteries. Mild hyperexpansion of the lungs with mildly prominent  interstitial markings. Mild thoracic spine degenerative changes. IMPRESSION: No acute abnormality. 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 Electronically Signed   By: Claudie Revering M.D.   On: 05/13/2016 13:20    Procedures Procedures (including critical care time)  Medications Ordered in ED Medications  albuterol (PROVENTIL,VENTOLIN) solution continuous neb (10 mg/hr Nebulization New Bag/Given 05/13/16 1311)  sodium chloride 0.9 % bolus 1,000 mL (0 mLs Intravenous Stopped 05/13/16 1402)    And  sodium chloride 0.9 % bolus 1,000 mL (0 mLs Intravenous Stopped 05/13/16 1402)    And  sodium chloride 0.9 % bolus 1,000 mL (0 mLs Intravenous Stopped 05/13/16 1503)  cefTRIAXone (ROCEPHIN) 1 g in dextrose 5 % 50 mL IVPB (1 g Intravenous New Bag/Given 05/13/16 1459)  azithromycin (ZITHROMAX) tablet 500 mg (500 mg Oral Given 05/13/16 1458)     Initial Impression / Assessment and Plan / ED Course  I have reviewed the triage vital signs and the nursing notes.  Pertinent labs & imaging results that were available during my care of the patient were reviewed by me and considered in my medical decision making (see chart for details).  Clinical Course    Discussed  treatment plan with patient and will work up for potential pulmonary infection or sepsis including fluids, xray, labs, and albuterol.  30 cc/kilo bolus given as well.  The patient continued to have hypotension despite several fluid boluses, antibiotic were given, flu swab was negative, lab results were reviewed showing a white blood cell count of 50,000, no definite source but I suspect pneumonia given the patient's respiratory status. Discussed with the hospitalist who will admit to stepdown, the patient does appear critically ill with persistent hypotension fever and hypoxia to 90%. There is no evidence of DVT on exam  CRITICAL CARE Performed by: Johnna Acosta Total critical care time: 35 minutes Critical care time was exclusive of separately billable procedures and treating other patients. Critical care was necessary to treat or prevent imminent or life-threatening deterioration. Critical care was time spent personally by me on the following activities: development of treatment plan with patient and/or surrogate as well as nursing, discussions with consultants, evaluation of patient's response to treatment, examination of patient, obtaining history from patient or surrogate, ordering and performing treatments and interventions, ordering and review of laboratory studies, ordering and review of radiographic studies, pulse oximetry and re-evaluation of patient's condition.   Final Clinical Impressions(s) / ED Diagnoses   Final diagnoses:  Sepsis, due to unspecified organism [facility])    New Prescriptions New Prescriptions   No medications on file   I personally performed the services described in this documentation, which was scribed in my presence. The recorded information has been reviewed and is accurate.        Noemi Chapel, MD 05/13/16 [phone]

## 2016-05-13 NOTE — ED Triage Notes (Signed)
Patient c/o fever. Per patient started with sore throat on Friday but denies sore throat at this time. Patient does report body aches and chills with a dry hacky cough at night. Highest temp 103 at home per wife. Patient taking tylenol with relief, last taken at 8am.

## 2016-05-13 NOTE — ED Notes (Signed)
CRITICAL VALUE ALERT  Critical value received: WBC 51.1  Date of notification:  12/16//17  Time of notification:  1258  Critical value read back: yes   Nurse who received alert:  Marliss Coots RN  MD notified (1st page):  Dr Sabra Heck  Time of first page:  12 58

## 2016-05-18 DIAGNOSIS — J060 Acute laryngopharyngitis: Secondary | ICD-10-CM | POA: Diagnosis not present

## 2016-05-18 LAB — URINE CULTURE

## 2016-05-18 LAB — CULTURE, BLOOD (ROUTINE X 2)
Culture: NO GROWTH
Culture: NO GROWTH

## 2016-05-19 ENCOUNTER — Telehealth: Payer: Self-pay

## 2016-05-19 NOTE — Progress Notes (Signed)
ED Antimicrobial Stewardship Positive Culture Follow Up   Andrew [name] is an 75 y.o. male who presented to [facility] on 05/13/2016 with a chief complaint of  Chief Complaint  Patient presents with  . Fever    Recent Results (from the past 720 hour(s))  Blood Culture (routine x 2)     Status: None   Collection Time: 05/13/16 12:12 PM  Result Value Ref Range Status   Specimen Description BLOOD RIGHT ARM IV  Final   Special Requests BOTTLES DRAWN AEROBIC AND ANAEROBIC 8CC  Final   Culture NO GROWTH 5 DAYS  Final   Report Status 05/18/2016 FINAL  Final  Urine culture     Status: Abnormal   Collection Time: 05/13/16 12:12 PM  Result Value Ref Range Status   Specimen Description URINE, CLEAN CATCH  Final   Special Requests NONE  Final   Culture 40,000 COLONIES/mL ENTEROCOCCUS FAECALIS (A)  Final   Report Status 05/18/2016 FINAL  Final   Organism ID, Bacteria ENTEROCOCCUS FAECALIS (A)  Final      Susceptibility   Enterococcus faecalis - MIC*    AMPICILLIN <=2 SENSITIVE Sensitive     LEVOFLOXACIN 0.5 SENSITIVE Sensitive     NITROFURANTOIN <=16 SENSITIVE Sensitive     VANCOMYCIN 1 SENSITIVE Sensitive     * 40,000 COLONIES/mL ENTEROCOCCUS FAECALIS  Blood Culture (routine x 2)     Status: None   Collection Time: 05/13/16 12:17 PM  Result Value Ref Range Status   Specimen Description BLOOD LEFT ANTECUBITAL  Final   Special Requests BOTTLES DRAWN AEROBIC AND ANAEROBIC 8CC  Final   Culture NO GROWTH 5 DAYS  Final   Report Status 05/18/2016 FINAL  Final    No abx indicated   ED Provider: Domenic Moras, PA-C  [name] 05/19/2016, 8:30 AM Infectious Diseases Pharmacist Phone# [phone]

## 2016-05-19 NOTE — Telephone Encounter (Signed)
No treatment needed for UC per Domenic Moras Pa-c

## 2016-06-01 ENCOUNTER — Encounter (HOSPITAL_COMMUNITY): Payer: Medicare HMO | Attending: Hematology & Oncology

## 2016-06-01 DIAGNOSIS — C9110 Chronic lymphocytic leukemia of B-cell type not having achieved remission: Secondary | ICD-10-CM | POA: Diagnosis not present

## 2016-06-01 LAB — LACTATE DEHYDROGENASE: LDH: 130 U/L (ref 98–192)

## 2016-06-01 LAB — CBC WITH DIFFERENTIAL/PLATELET
BASOS ABS: 0 10*3/uL (ref 0.0–0.1)
Basophils Relative: 0 %
EOS ABS: 0.6 10*3/uL (ref 0.0–0.7)
Eosinophils Relative: 1 %
HEMATOCRIT: 43.7 % (ref 39.0–52.0)
HEMOGLOBIN: 14.8 g/dL (ref 13.0–17.0)
Lymphocytes Relative: 92 %
Lymphs Abs: 55.9 10*3/uL — ABNORMAL HIGH (ref 0.7–4.0)
MCH: 32.5 pg (ref 26.0–34.0)
MCHC: 33.9 g/dL (ref 30.0–36.0)
MCV: 95.8 fL (ref 78.0–100.0)
MONOS PCT: 1 %
Monocytes Absolute: 0.6 10*3/uL (ref 0.1–1.0)
NEUTROS PCT: 6 %
Neutro Abs: 3.6 10*3/uL (ref 1.7–7.7)
Platelets: 100 10*3/uL — ABNORMAL LOW (ref 150–400)
RBC: 4.56 MIL/uL (ref 4.22–5.81)
RDW: 13.2 % (ref 11.5–15.5)
WBC: 60.7 10*3/uL (ref 4.0–10.5)

## 2016-06-01 LAB — COMPREHENSIVE METABOLIC PANEL
ALBUMIN: 4.2 g/dL (ref 3.5–5.0)
ALK PHOS: 51 U/L (ref 38–126)
ALT: 17 U/L (ref 17–63)
ANION GAP: 6 (ref 5–15)
AST: 24 U/L (ref 15–41)
BILIRUBIN TOTAL: 0.9 mg/dL (ref 0.3–1.2)
BUN: 25 mg/dL — AB (ref 6–20)
CO2: 31 mmol/L (ref 22–32)
CREATININE: 1.3 mg/dL — AB (ref 0.61–1.24)
Calcium: 9.7 mg/dL (ref 8.9–10.3)
Chloride: 102 mmol/L (ref 101–111)
GFR calc Af Amer: 60 mL/min — ABNORMAL LOW (ref >60)
GFR calc non Af Amer: 52 mL/min — ABNORMAL LOW (ref >60)
GLUCOSE: 113 mg/dL — AB (ref 65–99)
Potassium: 4 mmol/L (ref 3.5–5.1)
SODIUM: 139 mmol/L (ref 135–145)
TOTAL PROTEIN: 6.5 g/dL (ref 6.5–8.1)

## 2016-06-19 DIAGNOSIS — C44329 Squamous cell carcinoma of skin of other parts of face: Secondary | ICD-10-CM | POA: Diagnosis not present

## 2016-06-27 ENCOUNTER — Encounter (HOSPITAL_COMMUNITY): Payer: Self-pay | Admitting: Hematology & Oncology

## 2016-07-03 ENCOUNTER — Other Ambulatory Visit (HOSPITAL_COMMUNITY): Payer: Medicare HMO

## 2016-07-03 ENCOUNTER — Ambulatory Visit (HOSPITAL_COMMUNITY): Payer: Medicare HMO | Admitting: Hematology & Oncology

## 2016-07-10 DIAGNOSIS — E782 Mixed hyperlipidemia: Secondary | ICD-10-CM | POA: Diagnosis not present

## 2016-07-10 DIAGNOSIS — E6609 Other obesity due to excess calories: Secondary | ICD-10-CM | POA: Diagnosis not present

## 2016-07-10 DIAGNOSIS — R7301 Impaired fasting glucose: Secondary | ICD-10-CM | POA: Diagnosis not present

## 2016-07-10 DIAGNOSIS — Z125 Encounter for screening for malignant neoplasm of prostate: Secondary | ICD-10-CM | POA: Diagnosis not present

## 2016-07-10 DIAGNOSIS — I10 Essential (primary) hypertension: Secondary | ICD-10-CM | POA: Diagnosis not present

## 2016-07-13 DIAGNOSIS — D696 Thrombocytopenia, unspecified: Secondary | ICD-10-CM | POA: Diagnosis not present

## 2016-07-13 DIAGNOSIS — Z0000 Encounter for general adult medical examination without abnormal findings: Secondary | ICD-10-CM | POA: Diagnosis not present

## 2016-07-13 DIAGNOSIS — R7301 Impaired fasting glucose: Secondary | ICD-10-CM | POA: Diagnosis not present

## 2016-07-13 DIAGNOSIS — R944 Abnormal results of kidney function studies: Secondary | ICD-10-CM | POA: Diagnosis not present

## 2016-07-13 DIAGNOSIS — I10 Essential (primary) hypertension: Secondary | ICD-10-CM | POA: Diagnosis not present

## 2016-07-13 DIAGNOSIS — I482 Chronic atrial fibrillation: Secondary | ICD-10-CM | POA: Diagnosis not present

## 2016-07-13 DIAGNOSIS — C9110 Chronic lymphocytic leukemia of B-cell type not having achieved remission: Secondary | ICD-10-CM | POA: Diagnosis not present

## 2016-07-24 DIAGNOSIS — Z08 Encounter for follow-up examination after completed treatment for malignant neoplasm: Secondary | ICD-10-CM | POA: Diagnosis not present

## 2016-07-24 DIAGNOSIS — Z85828 Personal history of other malignant neoplasm of skin: Secondary | ICD-10-CM | POA: Diagnosis not present

## 2016-07-27 DIAGNOSIS — E782 Mixed hyperlipidemia: Secondary | ICD-10-CM | POA: Diagnosis not present

## 2016-07-31 ENCOUNTER — Encounter (HOSPITAL_COMMUNITY): Payer: Medicare HMO | Attending: Oncology

## 2016-07-31 ENCOUNTER — Encounter (HOSPITAL_COMMUNITY): Payer: Medicare HMO | Attending: Oncology | Admitting: Oncology

## 2016-07-31 ENCOUNTER — Encounter (HOSPITAL_COMMUNITY): Payer: Self-pay

## 2016-07-31 ENCOUNTER — Other Ambulatory Visit (HOSPITAL_COMMUNITY): Payer: Self-pay | Admitting: Oncology

## 2016-07-31 VITALS — BP 113/72 | HR 67 | Temp 98.3°F | Resp 18 | Wt 225.4 lb

## 2016-07-31 DIAGNOSIS — D696 Thrombocytopenia, unspecified: Secondary | ICD-10-CM | POA: Diagnosis not present

## 2016-07-31 DIAGNOSIS — D801 Nonfamilial hypogammaglobulinemia: Secondary | ICD-10-CM | POA: Diagnosis not present

## 2016-07-31 DIAGNOSIS — R599 Enlarged lymph nodes, unspecified: Secondary | ICD-10-CM | POA: Diagnosis not present

## 2016-07-31 DIAGNOSIS — C9110 Chronic lymphocytic leukemia of B-cell type not having achieved remission: Secondary | ICD-10-CM

## 2016-07-31 LAB — CBC WITH DIFFERENTIAL/PLATELET
BASOS ABS: 0.1 10*3/uL (ref 0.0–0.1)
Basophils Relative: 0 %
EOS ABS: 0.4 10*3/uL (ref 0.0–0.7)
EOS PCT: 1 %
HCT: 42.3 % (ref 39.0–52.0)
Hemoglobin: 14.5 g/dL (ref 13.0–17.0)
Lymphocytes Relative: 93 %
Lymphs Abs: 63.9 10*3/uL — ABNORMAL HIGH (ref 0.7–4.0)
MCH: 32.2 pg (ref 26.0–34.0)
MCHC: 34.3 g/dL (ref 30.0–36.0)
MCV: 94 fL (ref 78.0–100.0)
MONO ABS: 1 10*3/uL (ref 0.1–1.0)
MONOS PCT: 2 %
Neutro Abs: 3.6 10*3/uL (ref 1.7–7.7)
Neutrophils Relative %: 5 %
Platelets: 85 10*3/uL — ABNORMAL LOW (ref 150–400)
RBC: 4.5 MIL/uL (ref 4.22–5.81)
RDW: 13.2 % (ref 11.5–15.5)
WBC: 69 10*3/uL (ref 4.0–10.5)

## 2016-07-31 LAB — COMPREHENSIVE METABOLIC PANEL
ALBUMIN: 4.2 g/dL (ref 3.5–5.0)
ALT: 19 U/L (ref 17–63)
ANION GAP: 7 (ref 5–15)
AST: 31 U/L (ref 15–41)
Alkaline Phosphatase: 56 U/L (ref 38–126)
BUN: 25 mg/dL — ABNORMAL HIGH (ref 6–20)
CALCIUM: 9.5 mg/dL (ref 8.9–10.3)
CO2: 25 mmol/L (ref 22–32)
Chloride: 104 mmol/L (ref 101–111)
Creatinine, Ser: 1.45 mg/dL — ABNORMAL HIGH (ref 0.61–1.24)
GFR calc Af Amer: 52 mL/min — ABNORMAL LOW (ref >60)
GFR calc non Af Amer: 45 mL/min — ABNORMAL LOW (ref >60)
GLUCOSE: 171 mg/dL — AB (ref 65–99)
POTASSIUM: 3.5 mmol/L (ref 3.5–5.1)
SODIUM: 136 mmol/L (ref 135–145)
TOTAL PROTEIN: 6.6 g/dL (ref 6.5–8.1)
Total Bilirubin: 0.7 mg/dL (ref 0.3–1.2)

## 2016-07-31 LAB — LACTATE DEHYDROGENASE: LDH: 168 U/L (ref 98–192)

## 2016-07-31 MED ORDER — IBRUTINIB 140 MG PO CAPS: 420.0000 mg | ORAL_CAPSULE | Freq: Every day | ORAL | 3 refills | Status: DC

## 2016-07-31 NOTE — Patient Instructions (Signed)
[place] at [facility] Discharge Instructions  RECOMMENDATIONS MADE BY THE CONSULTANT AND ANY TEST RESULTS WILL BE SENT TO YOUR REFERRING PHYSICIAN.  You were seen today by Dr. Twana First CT scan and Bone marrow biopsy will be scheduled Follow up in 4 weeks See Amy up front for appointments   Thank you for choosing [place] at [facility] to provide your oncology and hematology care.  To afford each patient quality time with our provider, please arrive at least 15 minutes before your scheduled appointment time.    If you have a lab appointment with the [facility] please come in thru the  Main Entrance and check in at the main information desk  You need to re-schedule your appointment should you arrive 10 or more minutes late.  We strive to give you quality time with our providers, and arriving late affects you and other patients whose appointments are after yours.  Also, if you no show three or more times for appointments you may be dismissed from the clinic at the providers discretion.     Again, thank you for choosing [facility].  Our hope is that these requests will decrease the amount of time that you wait before being seen by our physicians.       _____________________________________________________________  Should you have questions after your visit to [facility], please contact our office at [phone] between the hours of 8:30 a.m. and 4:30 p.m.  Voicemails left after 4:30 p.m. will not be returned until the following business day.  For prescription refill requests, have your pharmacy contact our office.       Resources For Cancer Patients and their Caregivers ? American Cancer Society: Can assist with transportation, wigs, general needs, runs Look Good Feel Better.        4355320515 ? Cancer Care: Provides financial assistance, online support groups, medication/co-pay assistance.   1-800-813-HOPE (913)100-3972) ? [employer] Assists [place] cancer patients and their families through emotional , educational and financial support.  610-204-5362 ? [place] DSS Where to apply for food stamps, Medicaid and utility assistance. (812)398-9051 ? [employer]: Transportation to medical appointments. 8586903563 ? Social Security Administration: May apply for disability if have a Stage IV cancer. (601)437-5050 870-392-0116 ? [employer], Disability and Transit Services: Assists with nutrition, care and transit needs. [place] Support Programs: '@10RELATIVEDAYS' @ > Cancer Support Group  2nd Tuesday of the month 1pm-2pm, Journey Room  > Creative Journey  3rd Tuesday of the month 1130am-1pm, Journey Room  > Look Good Feel Better  1st Wednesday of the month 10am-12 noon, Journey Room (Call [place] to register [phone])

## 2016-07-31 NOTE — Progress Notes (Signed)
CRITICAL VALUE ALERT Critical value received:  WBC- 69 Date of notification:  07/31/16 Time of notification: 7159 Critical value read back:  Yes.   Nurse who received alert:  M.Blakelee Allington, LPN MD notified (1st page):  Barbaraann Share Zhou,MD

## 2016-07-31 NOTE — Progress Notes (Signed)
[place] at [facility] NOTE  Patient Care Team: [name], MD as PCP - General (Internal Medicine)  DIAGNOSIS:  Thrombocytopenia  Chronic Lymphocytic Leukemia/Small Lymphocytic lymphoma. ZAP 70 at 7% IgG low at 589 mg/dl Lymphadenopathy  HISTORY OF PRESENTING ILLNESS:  Andrew [name] 76 y.o. male is here for additional follow-up of CLL/SLL  He has been doing well. He still feels the enlarged lymph nodes in his neck. Denies night sweats, weight loss, loss of appetite, or any other concerns. Works part time.  MEDICAL HISTORY:  Past Medical History:  Diagnosis Date  . CLL (chronic lymphocytic leukemia) ([place])   . Dysrhythmia    Hx. of Atrial Fibrillation- has resolved  . Hypertension   . Pinched nerve in neck     SURGICAL HISTORY: Past Surgical History:  Procedure Laterality Date  . CATARACT EXTRACTION, BILATERAL    . COLONOSCOPY  07/10/2011   Procedure: COLONOSCOPY;  Surgeon: Dorothyann Peng, MD;  Location[facility];  Service: Endoscopy;  Laterality: N/A;  10:30 AM  . THYROIDECTOMY, PARTIAL    . TONSILLECTOMY      SOCIAL HISTORY: Social History   Social History  . Marital status: Married    Spouse name: N/A  . Number of children: N/A  . Years of education: N/A   Occupational History  . Not on file.   Social History Main Topics  . Smoking status: Former Smoker    Packs/day: 1.00    Years: 15.00  . Smokeless tobacco: Never Used  . Alcohol use No  . Drug use: No  . Sexual activity: Not on file   Other Topics Concern  . Not on file   Social History Narrative  . No narrative on file  Married for 48 years. 3 children, 6 grandchildren. 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 He smoked one ppd for 15 years and quit in 1972. No ETOH or chewing tobacco  FAMILY HISTORY: Family History  Problem Relation Age of Onset  . Colon cancer Neg Hx    has no family status information on file.      Father died at 32 from an MI. He states they found "cancer" at his autopsy. Mother died at 45 from alzheimers.  One brother and sister who are healthy.  Has children. One daughter is a Marine scientist.  ALLERGIES:  has No Known Allergies.  MEDICATIONS:  Current Outpatient Prescriptions  Medication Sig Dispense Refill  . amLODipine (NORVASC) 10 MG tablet Take 10 mg by mouth daily.    Marland Kitchen aspirin 325 MG tablet Take 325 mg by mouth daily.    . carvedilol (COREG) 12.5 MG tablet Take 12.5 mg by mouth 2 (two) times daily with a meal.    . Coenzyme Q10 (COQ10) 100 MG CAPS Take 1 tablet by mouth daily.    . fish oil-omega-3 fatty acids 1000 MG capsule Take 1 g by mouth 2 (two) times daily.    Marland Kitchen ibrutinib (IMBRUVICA) 140 MG capsul Take 3 capsules (420 mg total) by mouth daily. 30 capsule 3  . Multiple Vitamin (MULITIVITAMIN WITH MINERALS) TABS Take 1 tablet by mouth daily.    Marland Kitchen telmisartan-hydrochlorothiazide (MICARDIS HCT) 80-25 MG tablet Take 1 tablet by mouth daily.      No current facility-administered medications for this visit.     Review of Systems  Constitutional: Negative.  Negative for weight loss.       No night  sweats No loss of appetite  HENT:       Cervical lymphadenopathy   Eyes: Negative.   Respiratory: Negative.   Cardiovascular: Negative.   Gastrointestinal: Negative.   Genitourinary: Negative.   Musculoskeletal: Negative.   Skin: Negative.   Neurological: Negative.   Endo/Heme/Allergies: Negative.   Psychiatric/Behavioral: Negative.   All other systems reviewed and are negative. 14 point ROS was done and is otherwise as detailed above or in HPI  PHYSICAL EXAMINATION: ECOG PERFORMANCE STATUS: 0 - Asymptomatic  Vitals:   07/31/16 1530  BP: 113/72  Pulse: 67  Resp: 18  Temp: 98.3 F (36.8 C)    Physical Exam  Constitutional: He is oriented to person, place, and time and well-developed, well-nourished, and in no distress.  HENT:  Head: Normocephalic and  atraumatic.  Mouth/Throat: Oropharynx is clear and moist.  Eyes: Conjunctivae and EOM are normal. Pupils are equal, round, and reactive to light.  Neck: Normal range of motion. Neck supple.  Cardiovascular: Normal rate, regular rhythm and normal heart sounds.   Pulmonary/Chest: Effort normal and breath sounds normal.  Abdominal: Soft. Bowel sounds are normal.  Musculoskeletal: Normal range of motion.  Lymphadenopathy:       Head (right side): Submental and submandibular adenopathy present.       Head (left side): Submental and submandibular adenopathy present.    He has cervical adenopathy (b/l multiple bulky neck lymph nodes).       Right cervical: Superficial cervical, deep cervical and posterior cervical adenopathy present.       Left cervical: Superficial cervical, deep cervical and posterior cervical adenopathy present.       Right: No supraclavicular adenopathy present.       Left: Supraclavicular adenopathy present.  Neurological: He is alert and oriented to person, place, and time. Gait normal.  Skin: Skin is warm and dry.  Nursing note and vitals reviewed.  LABORATORY DATA:  I have reviewed the data as listed Lab Results  Component Value Date   WBC 69.0 (HH) 07/31/2016   HGB 14.5 07/31/2016   HCT 42.3 07/31/2016   MCV 94.0 07/31/2016   PLT 85 (L) 07/31/2016     Chemistry      Component Value Date/Time   NA 136 07/31/2016 1435   K 3.5 07/31/2016 1435   CL 104 07/31/2016 1435   CO2 25 07/31/2016 1435   BUN 25 (H) 07/31/2016 1435   CREATININE 1.45 (H) 07/31/2016 1435      Component Value Date/Time   CALCIUM 9.5 07/31/2016 1435   ALKPHOS 56 07/31/2016 1435   AST 31 07/31/2016 1435   ALT 19 07/31/2016 1435   BILITOT 0.7 07/31/2016 1435      ASSESSMENT & PLAN:   CLL/SLL Rai staging, Stage I initially. 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  I reviewed his labs. He has been progressing. I have recommended starting treatment with imbruvica.   Start on Imbruvica 420 mg PO day. We discussed the possible side effects. I will give him some print outs with more information on this.   Ordered repeat CT neck/chest/abd pelvis without contrast (due to CKD) scan to access for lymphadenopathy.   I have ordered a baseline bone marrow biopsy.   He will return for follow up in 4 weeks.   All questions were answered. The patient  knows to call the clinic with any problems, questions or concerns. I have advised them to write down any questions to bring with them to their next follow-up.   This document serves as a record of services personally performed by Twana First, MD. It was created on her behalf by Martinique Casey, a trained medical scribe. The creation of this record is based on the scribe's personal observations and the provider's statements to them. This document has been checked and approved by the attending provider.  I have reviewed the above documentation for accuracy and completeness and I agree with the above.

## 2016-08-01 ENCOUNTER — Other Ambulatory Visit (HOSPITAL_COMMUNITY): Payer: Self-pay | Admitting: Oncology

## 2016-08-01 ENCOUNTER — Telehealth (HOSPITAL_COMMUNITY): Payer: Self-pay | Admitting: Oncology

## 2016-08-01 ENCOUNTER — Ambulatory Visit (HOSPITAL_COMMUNITY): Payer: Medicare HMO

## 2016-08-01 MED ORDER — IBRUTINIB 140 MG PO CAPS: 420.0000 mg | ORAL_CAPSULE | Freq: Every day | ORAL | 3 refills | Status: DC

## 2016-08-01 NOTE — Telephone Encounter (Signed)
Faxed imbruvica script to [facility]

## 2016-08-03 ENCOUNTER — Encounter (HOSPITAL_COMMUNITY): Payer: Medicare HMO

## 2016-08-03 ENCOUNTER — Encounter (HOSPITAL_COMMUNITY): Payer: Self-pay | Admitting: Oncology

## 2016-08-03 ENCOUNTER — Ambulatory Visit (HOSPITAL_COMMUNITY)
Admission: RE | Admit: 2016-08-03 | Discharge: 2016-08-03 | Disposition: A | Discharge status: Home / Self Care | Payer: Medicare HMO | Source: Ambulatory Visit | Attending: Oncology | Admitting: Oncology

## 2016-08-03 ENCOUNTER — Encounter (HOSPITAL_COMMUNITY): Payer: Self-pay | Admitting: Emergency Medicine

## 2016-08-03 DIAGNOSIS — R161 Splenomegaly, not elsewhere classified: Secondary | ICD-10-CM | POA: Diagnosis not present

## 2016-08-03 DIAGNOSIS — R935 Abnormal findings on diagnostic imaging of other abdominal regions, including retroperitoneum: Secondary | ICD-10-CM | POA: Diagnosis not present

## 2016-08-03 DIAGNOSIS — N400 Enlarged prostate without lower urinary tract symptoms: Secondary | ICD-10-CM | POA: Diagnosis not present

## 2016-08-03 DIAGNOSIS — I700 Atherosclerosis of aorta: Secondary | ICD-10-CM | POA: Diagnosis not present

## 2016-08-03 DIAGNOSIS — I2510 Atherosclerotic heart disease of native coronary artery without angina pectoris: Secondary | ICD-10-CM | POA: Insufficient documentation

## 2016-08-03 DIAGNOSIS — R590 Localized enlarged lymph nodes: Secondary | ICD-10-CM | POA: Diagnosis not present

## 2016-08-03 DIAGNOSIS — C9110 Chronic lymphocytic leukemia of B-cell type not having achieved remission: Secondary | ICD-10-CM

## 2016-08-03 MED ORDER — ONDANSETRON HCL 8 MG PO TABS: 8.0000 mg | ORAL_TABLET | Freq: Three times a day (TID) | ORAL | 2 refills | Status: DC | PRN

## 2016-08-03 MED ORDER — PROCHLORPERAZINE MALEATE 10 MG PO TABS: 10.0000 mg | ORAL_TABLET | Freq: Four times a day (QID) | ORAL | 2 refills | Status: DC | PRN

## 2016-08-03 NOTE — Patient Instructions (Signed)
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  You will take 3 capsules (420 mg) all at once by mouth daily.   You will see the doctor regularly throughout treatment.  We also monitor your blood work when you come in for your doctors appointments.  The doctor monitors your response to treatment by the way you are feeling, your blood work, and scans periodically.    POTENTIAL SIDE EFFECTS OF TREATMENT:  How Imbruvica Is Given: Imbruvica is a capsule, taken by mouth once daily. You may take 1-4 capsules at once depending on your prescribed dose.  Take Imbruvica at approximately the same time each day.  Take Imbruvica exactly as prescribed.  Swallow Imbruvica capsules whole with at least 8 ounces of water. Do not crush, open, chew or dissolve capsules.  Do not change your dose or stop Imbruvica unless your health care provider tells you to.  If you miss a dose of Imbruvica, it should be taken as soon as possible on the same day with return to the normal schedule the following day. Do not take extra capsules of Imbruvica to make up the next dose.  Do not take more than 1 dose of Imbruvica at one time. Call your health care provider and go to the emergency room right away if you take too much.  Store at room temperature (68 F to 60 F); keep medication in original bottle; protect from high humidity, moisture and light.  Keep out of reach from children and pets.  Safely throw away any Imbruvica that is out of date or unused (ask your provider for directions on how to discard the medication).  The amount of Imbruvica that you will receive depends on many factors, including your general health or other health problems, and the type of cancer or condition you have. Your doctor will determine your exact Imbruvica dosage and schedule.   Side Effects: Important things to remember about the side effects of  Imbruvica:  Most people will not experience all of the Imbruvica side effects listed.  Imbruvica side effects are often predictable in terms of their onset, duration, and severity.  Imbruvica side effects are almost always reversible and will go away after therapy is complete.  Imbruvica side effects may be quite manageable. There are many options to minimize or prevent the side effects of Imbruvica.   The following side effects are common (occurring in greater than 30%) for patients taking Imbruvica: Decreased platelets  Diarrhea  Decreased neutrophils  Decreased hemaglobin  Fatigue  Musculoskeletal pain  Swelling  Upper respiratory tract infection  Nausea  Bruising  These are less common side effects (occurring in 10-29%) for patients receiving Imbruvica: Shortness of breath  Constipation  Rash  Abdominal pain  Vomiting  Decreased appetite  Cough  Fever  Stomatitis  Dizziness  Urinary Tract Infection  Pneumonia  Skin infections  Asthenia  Muscle spasms  Sinusitis  Headache  Dehydration  Dyspepsia  Petechiae  Arthralgia  Nosebleeds Not all side effects are listed above. Some that are rare (occurring in less than about 10 percent of patients) are not listed here. Always inform your health care provider if you experience any unusual symptoms.   When to contact your doctor or health care provider: Contact your health care provider immediately, day or night, if you should experience any of the following symptoms:  Fever of 100.4 F (38 C or higher, chills)  Shortness of breath, cough or trouble breathing  Any bleeding that wont stop  The following symptoms require medical attention, but are not an emergency. Contact your doctor or health care provider within 24 hours of noticing any of the following:  Diarrhea (4-6 episodes in a 24-hour period).  Blood in your stools or black stools  Headache that lasts a long time, confusion, change in your speech  Nausea  (interferes with ability to eat and unrelieved with prescribed medication).  Vomiting (vomiting more than 4-5 times in a 24 hour period).  Unable to eat or drink for 24 hours or have signs of dehydration: tiredness, thirst, dry mouth, dark and decrease amount of urine, or dizziness.  Skin or the whites of your eyes turn yellow  Urine turns dark or brown (tea color)  Decreased appetite  Pain on the right side of your stomach  Bleed or bruise more easily than normal  Any skin or nail changes (rash, itching, severe dryness, blisters, nail infection, inflammation of the lips, etc.)  Cough with or without mucus  Mouth sores  Pain or burning with urination  Extreme fatigue (unable to carry on self-care activities)  Always inform your doctor or health care provider if you experience any unusual symptoms.  Precautions: Do not drink grapefruit juice, eat grapefruit or eat [place] oranges (often used in marmalades) while you are taking Imbruvica. These products may increase the amount of Imbruvica in your blood.  Before starting Imbruvica treatment, make sure you tell your doctor about any other medications you are taking (including prescription, over-the-counter, vitamins, herbal remedies, etc.).  While taking Imbruvica, do not receive any kind of immunization or vaccination without your doctors approval.  Limit your time in the sun. Imbruvica can make your skin sensitive to sunlight. A severe sunburn, rash or worsening acne can occur with too much exposure. Remember to use sunscreen and wear a hat and clothes that cover as much of your skin as possible while taking Imbruvica.  Inform your health care professional if you are pregnant or may be pregnant prior to starting this treatment. Pregnancy category D (may be hazardous to the fetus. Women who are pregnant or become pregnant must be advised of the potential hazard to the fetus.)  For both men and women: Barrier methods of contraception, such  as condoms, are recommended during therapy and for at least 2 weeks after treatment is complete. Discuss with your doctor when you may safely become pregnant or conceive a child after therapy.  Do not breast feed while taking Imbruvica.   Self-Care Tips: While taking Imbruvica, drink at least two to three quarts of fluid every 24 hours, unless you are instructed otherwise.  Wash your hands often and after taking each dose of Imbruvica.  You may be at risk of infection so try to avoid crowds or people with colds, and report fever or any other signs of infection immediately to your health care provider.  To help treat/prevent mouth sores while taking Imbruvica, use a soft toothbrush, and rinse three times a day with 1 teaspoon of baking soda mixed with 8 ounces of water.  Use an electric razor and a soft toothbrush to minimize bleeding.  Avoid contact sports or activities that could cause injury.  To reduce nausea, take anti-nausea medications as prescribed by your doctor, and eat small, frequent meals while taking Imbruvica.  Eat foods that may help reduce diarrhea-see  Follow regimen of anti-diarrhea medication as prescribed by your health care professional.  Managing Side Effects - Diarrhea  Avoid sun exposure. Wear SPF 15 (or higher) sun block and protective clothing. Imbruvica may make you more sensitive to the sun and you may sunburn more easily.  In general, drinking alcoholic beverages should be kept to a minimum or avoided completely while you are taking Imbruvica. You should discuss this with your doctor.  Get plenty of rest.  Maintain good nutrition while being treated with Imbruvica.    SELF CARE ACTIVITIES WHILE ON CHEMOTHERAPY: Hydration Increase your fluid intake 48 hours prior to treatment and drink at least 8 to 12 cups (64 ounces) of water/decaff beverages per day after treatment. You can still have your cup of coffee or soda but these beverages do not count as part of  your 8 to 12 cups that you need to drink daily. No alcohol intake.  Medications Continue taking your normal prescription medication as prescribed.  If you start any new herbal or new supplements please let us know first to make sure it is safe.  Mouth Care Have teeth cleaned professionally before starting treatment. Keep dentures and partial plates clean. Use soft toothbrush and do not use mouthwashes that contain alcohol. Biotene is a good mouthwash that is available at most pharmacies or may be ordered by calling [phone]. Use warm salt water gargles (1 teaspoon salt per 1 quart warm water) before and after meals and at bedtime. Or you may rinse with 2 tablespoons of three-percent hydrogen peroxide mixed in eight ounces of water. If you are still having problems with your mouth or sores in your mouth please call the clinic. If you need dental work, please let the doctor know before you go for your appointment so that we can coordinate the best possible time for you in regards to your chemo regimen. You need to also let your dentist know that you are actively taking chemo. We may need to do labs prior to your dental appointment.   Skin Care Always use sunscreen that has not expired and with SPF (Sun Protection Factor) of 50 or higher. Wear hats to protect your head from the sun. Remember to use sunscreen on your hands, ears, face, & feet.  Use good moisturizing lotions such as udder cream, eucerin, or even Vaseline. Some chemotherapies can cause dry skin, color changes in your skin and nails.     Avoid long, hot showers or baths.  Use gentle, fragrance-free soaps and laundry detergent.  Use moisturizers, preferably creams or ointments rather than lotions because the thicker consistency is better at preventing skin dehydration. Apply the cream or ointment within 15 minutes of showering. Reapply moisturizer at night, and moisturize your hands every time after you wash them.  Hair Loss (if  your doctor says your hair will fall out)   If your doctor says that your hair is likely to fall out, decide before you begin chemo whether you want to wear a wig. You may want to shop before treatment to match your hair color.  Hats, turbans, and scarves can also camouflage hair loss, although some people prefer to leave their heads uncovered. If you go bare-headed outdoors, be sure to use sunscreen on your scalp.  Cut your hair short. It eases the inconvenience of shedding lots of hair, but it also can reduce the emotional impact of watching your hair fall out.  Dont perm or color your hair during chemotherapy. Those chemical treatments are already damaging to hair and can enhance hair loss. Once your chemo  treatments are done and your hair has grown back, its OK to resume dyeing or perming hair. With chemotherapy, hair loss is almost always temporary. But when it grows back, it may be a different color or texture. In older adults who still had hair color before chemotherapy, the new growth may be completely gray.  Often, new hair is very fine and soft.  Infection Prevention Please wash your hands for at least 30 seconds using warm soapy water. Handwashing is the #1 way to prevent the spread of germs. Stay away from sick people or people who are getting over a cold. If you develop respiratory systems such as green/yellow mucus production or productive cough or persistent cough let us know and we will see if you need an antibiotic. It is a good idea to keep a pair of gloves on when going into grocery stores/[employer] to decrease your risk of coming into contact with germs on the carts, etc. Carry alcohol hand gel with you at all times and use it frequently if out in public. If your temperature reaches 100.5 or higher please call the clinic and let us know.  If it is after hours or on the weekend please go to the ER if your temperature is over 100.5.  Please have your own personal thermometer at home to  use.    Sex and bodily fluids If you are going to have sex, a condom must be used to protect the person that isnt taking chemotherapy. Chemo can decrease your libido (sex drive). For a few days after chemotherapy, chemotherapy can be excreted through your bodily fluids.  When using the toilet please close the lid and flush the toilet twice.  Do this for a few day after you have had chemotherapy.    Effects of chemotherapy on your sex life Some changes are simple and wont last long. They won't affect your sex life permanently. Sometimes you may feel:  too tired  not strong enough to be very active  sick or sore   not in the mood  anxious or low Your anxiety might not seem related to sex. For example, you may be worried about the cancer and how your treatment is going. Or you may be worried about money, or about how you family are coping with your illness. These things can cause stress, which can affect your interest in sex. Its important to talk to your partner about how you feel. Remember - the changes to your sex life don't usually last long. There's usually no medical reason to stop having sex during chemo. The drugs won't have any long term physical effects on your performance or enjoyment of sex. Cancer can't be passed on to your partner during sex  Contraception Its important to use reliable contraception during treatment. Avoid getting pregnant while you or your partner are having chemotherapy. This is because the drugs may harm the baby. Sometimes chemotherapy drugs can leave a man or woman infertile.  This means you would not be able to have children in the future. You might want to talk to someone about permanent infertility. It can be very difficult to learn that you may no longer be able to have children. Some people find counselling helpful. There might be ways to preserve your fertility, although this is easier for men than for women. You may want to speak to a fertility  expert. You can talk about sperm banking or harvesting your eggs. You can also ask about other fertility options, such  as donor eggs. If you have or have had breast cancer, your doctor might advise you not to take the contraceptive pill. This is because the hormones in it might affect the cancer.  It is not known for sure whether or not chemotherapy drugs can be passed on through semen or secretions from the vagina. Because of this some doctors advise people to use a barrier method if you have sex during treatment. This applies to vaginal, anal or oral sex. Generally, doctors advise a barrier method only for the time you are actually having the treatment and for about a week after your treatment. Advice like this can be worrying, but this does not mean that you have to avoid being intimate with your partner. You can still have close contact with your partner and continue to enjoy sex.  Animals If you have cats or birds we just ask that you not change the litter or change the cage.  Please have someone else do this for you while you are on chemotherapy.   Food Safety During and After Cancer Treatment Food safety is important for people both during and after cancer treatment. Cancer and cancer treatments, such as chemotherapy, radiation therapy, and stem cell/bone marrow transplantation, often weaken the immune system. This makes it harder for your body to protect itself from foodborne illness, also called food poisoning. Foodborne illness is caused by eating food that contains harmful bacteria, parasites, or viruses.  Foods to avoid Some foods have a higher risk of becoming tainted with bacteria. These include:  Unwashed fresh fruit and vegetables, especially leafy vegetables that can hide dirt and other contaminants  Raw sprouts, such as alfalfa sprouts  Raw or undercooked beef, especially ground beef, or other raw or undercooked meat and poultry  Fatty, fried, or spicy foods immediately before  or after treatment.  These can sit heavy on your stomach and make you feel nauseous.  Raw or undercooked shellfish, such as oysters.  Sushi and sashimi, which often contain raw fish.   Unpasteurized beverages, such as unpasteurized fruit juices, raw milk, raw yogurt, or cider  Undercooked eggs, such as soft boiled, over easy, and poached; raw, unpasteurized eggs; or foods made with raw egg, such as homemade raw cookie dough and homemade mayonnaise Simple steps for food safety Shop smart.  Do not buy food stored or displayed in an unclean area.  Do not buy bruised or damaged fruits or vegetables.  Do not buy cans that have cracks, dents, or bulges.  Pick up foods that can spoil at the end of your shopping trip and store them in a cooler on the way home. Prepare and clean up foods carefully.  Rinse all fresh fruits and vegetables under running water, and dry them with a clean towel or paper towel.  Clean the top of cans before opening them.  After preparing food, wash your hands for 20 seconds with hot water and soap. Pay special attention to areas between fingers and under nails.  Clean your utensils and dishes with hot water and soap.  Disinfect your kitchen and cutting boards using 1 teaspoon of liquid, unscented bleach mixed into 1 quart of water.   Dispose of old food.  Eat canned and packaged food before its expiration date (the use by or best before date).  Consume refrigerated leftovers within 3 to 4 days. After that time, throw out the food. Even if the food does not smell or look spoiled, it still may be unsafe. Some bacteria,  such as Listeria, can grow even on foods stored in the refrigerator if they are kept for too long. Take precautions when eating out.  At restaurants, avoid buffets and salad bars where food sits out for a long time and comes in contact with many people. Food can become contaminated when someone with a virus, often a norovirus, or another bug  handles it.  Put any leftover food in a to-go container yourself, rather than having the server do it. And, refrigerate leftovers as soon as you get home.  Choose restaurants that are clean and that are willing to prepare your food as you order it cooked.    MEDICATIONS: Zofran/Ondansetron 8mg  tablet. Take 1 tablet every 8 hours as needed for nausea/vomiting. (#1 nausea med to take, this can constipate)  Compazine/Prochlorperazine 10mg  tablet. Take 1 tablet every 6 hours as needed for nausea/vomiting. (#2 nausea med to take, this can make you sleepy)   Over-the-Counter Meds:  Miralax 1 capful in 8 oz of fluid daily. May increase to two times a day if needed. This is a stool softener. If this doesn't work proceed you can add:  Senokot S-start with 1 tablet two times a day and increase to 4 tablets two times a day if needed. (total of 8 tablets in a 24 hour period). This is a stimulant laxative.   Call us if this does not help your bowels move.   Imodium 2mg  capsule. Take 2 capsules after the 1st loose stool and then 1 capsule every 2 hours until you go a total of 12 hours without having a loose stool. Call the [place] [facility] if loose stools continue. If diarrhea occurs @ bedtime, take 2 capsules @ bedtime. Then take 2 capsules every 4 hours until morning. Call [facility].    SYMPTOMS TO REPORT AS SOON AS POSSIBLE AFTER TREATMENT:  FEVER GREATER THAN 100.5 F  CHILLS WITH OR WITHOUT FEVER  NAUSEA AND VOMITING THAT IS NOT CONTROLLED WITH YOUR NAUSEA MEDICATION  UNUSUAL SHORTNESS OF BREATH  UNUSUAL BRUISING OR BLEEDING  TENDERNESS IN MOUTH AND THROAT WITH OR WITHOUT PRESENCE OF ULCERS  URINARY PROBLEMS  BOWEL PROBLEMS  UNUSUAL RASH    Wear comfortable clothing and clothing appropriate for easy access to any Portacath or PICC line. Let us know if there is anything that we can do to make your therapy better!    What to do if you need assistance after hours or on the  weekends: CALL [phone].  HOLD on the line, do not hang up.  You will hear multiple messages but at the end you will be connected with a nurse triage line.  They will contact the doctor if necessary.  Most of the time they will be able to assist you.  Do not call the hospital operator.      I have been informed and understand all of the instructions given to me and have received a copy. I have been instructed to call the clinic [phone] or my family physician as soon as possible for continued medical care, if indicated. I do not have any more questions at this time but understand that I may call the [facility] or the Patient Navigator at [phone] during office hours should I have questions or need assistance in obtaining follow-up care.

## 2016-08-03 NOTE — Progress Notes (Signed)
Chemotherapy education pulled together.   Chemotherapy education completed by telephone.  Extensive teaching packet will be given and consent signed at next office visit.

## 2016-08-08 ENCOUNTER — Telehealth (HOSPITAL_COMMUNITY): Payer: Self-pay | Admitting: Oncology

## 2016-08-08 NOTE — Telephone Encounter (Signed)
APPROVED FOR COPAY ASSIST THRU PAN[facility] $[id number] EXP 08/06/17

## 2016-08-09 ENCOUNTER — Other Ambulatory Visit: Payer: Self-pay | Admitting: Radiology

## 2016-08-10 ENCOUNTER — Ambulatory Visit (HOSPITAL_COMMUNITY)
Admission: RE | Admit: 2016-08-10 | Discharge: 2016-08-10 | Disposition: A | Discharge status: Home / Self Care | Payer: Medicare HMO | Source: Ambulatory Visit | Attending: Oncology | Admitting: Oncology

## 2016-08-10 ENCOUNTER — Encounter (HOSPITAL_COMMUNITY): Payer: Self-pay

## 2016-08-10 ENCOUNTER — Other Ambulatory Visit: Payer: Self-pay | Admitting: Oncology

## 2016-08-10 DIAGNOSIS — Z7982 Long term (current) use of aspirin: Secondary | ICD-10-CM | POA: Diagnosis not present

## 2016-08-10 DIAGNOSIS — Z79899 Other long term (current) drug therapy: Secondary | ICD-10-CM | POA: Diagnosis not present

## 2016-08-10 DIAGNOSIS — C9110 Chronic lymphocytic leukemia of B-cell type not having achieved remission: Secondary | ICD-10-CM

## 2016-08-10 DIAGNOSIS — I4589 Other specified conduction disorders: Secondary | ICD-10-CM | POA: Diagnosis not present

## 2016-08-10 DIAGNOSIS — D696 Thrombocytopenia, unspecified: Secondary | ICD-10-CM | POA: Insufficient documentation

## 2016-08-10 DIAGNOSIS — Z87891 Personal history of nicotine dependence: Secondary | ICD-10-CM | POA: Insufficient documentation

## 2016-08-10 DIAGNOSIS — I4891 Unspecified atrial fibrillation: Secondary | ICD-10-CM | POA: Insufficient documentation

## 2016-08-10 LAB — CBC
HEMATOCRIT: 43.1 % (ref 39.0–52.0)
Hemoglobin: 14.8 g/dL (ref 13.0–17.0)
MCH: 31.1 pg (ref 26.0–34.0)
MCHC: 34.3 g/dL (ref 30.0–36.0)
MCV: 90.5 fL (ref 78.0–100.0)
Platelets: 96 10*3/uL — ABNORMAL LOW (ref 150–400)
RBC: 4.76 MIL/uL (ref 4.22–5.81)
RDW: 13.2 % (ref 11.5–15.5)
WBC: 78.7 10*3/uL (ref 4.0–10.5)

## 2016-08-10 LAB — PROTIME-INR
INR: 1
Prothrombin Time: 13.2 seconds (ref 11.4–15.2)

## 2016-08-10 LAB — APTT: aPTT: 27 seconds (ref 24–36)

## 2016-08-10 MED ORDER — MIDAZOLAM HCL 2 MG/2ML IJ SOLN
INTRAMUSCULAR | Status: AC | PRN
  Administered 2016-08-10 (×3): 1 mg via INTRAVENOUS

## 2016-08-10 MED ORDER — FENTANYL CITRATE (PF) 100 MCG/2ML IJ SOLN
INTRAMUSCULAR | Status: AC
  Filled 2016-08-10: qty 4

## 2016-08-10 MED ORDER — FENTANYL CITRATE (PF) 100 MCG/2ML IJ SOLN
INTRAMUSCULAR | Status: AC | PRN
  Administered 2016-08-10 (×2): 25 ug via INTRAVENOUS
  Administered 2016-08-10: 50 ug via INTRAVENOUS

## 2016-08-10 MED ORDER — MIDAZOLAM HCL 2 MG/2ML IJ SOLN
INTRAMUSCULAR | Status: AC
  Filled 2016-08-10: qty 4

## 2016-08-10 MED ORDER — SODIUM CHLORIDE 0.9 % IV SOLN
INTRAVENOUS | Status: DC
  Administered 2016-08-10: 07:00:00 via INTRAVENOUS

## 2016-08-10 NOTE — Discharge Instructions (Addendum)
Moderate Conscious Sedation, Adult, Care After These instructions provide you with information about caring for yourself after your procedure. Your health care provider may also give you more specific instructions. Your treatment has been planned according to current medical practices, but problems sometimes occur. Call your health care provider if you have any problems or questions after your procedure. What can I expect after the procedure? After your procedure, it is common:  To feel sleepy for several hours.  To feel clumsy and have poor balance for several hours.  To have poor judgment for several hours.  To vomit if you eat too soon. Follow these instructions at home: For at least 24 hours after the procedure:    Do not:  Participate in activities where you could fall or become injured.  Drive.  Use heavy machinery.  Drink alcohol.  Take sleeping pills or medicines that cause drowsiness.  Make important decisions or sign legal documents.  Take care of children on your own.  Rest. Eating and drinking   Follow the diet recommended by your health care provider.  If you vomit:  Drink water, juice, or soup when you can drink without vomiting.  Make sure you have little or no nausea before eating solid foods. General instructions   Have a responsible adult stay with you until you are awake and alert.  Take over-the-counter and prescription medicines only as told by your health care provider.  If you smoke, do not smoke without supervision.  Keep all follow-up visits as told by your health care provider. This is important. Contact a health care provider if:  You keep feeling nauseous or you keep vomiting.  You feel light-headed.  You develop a rash.  You have a fever. Get help right away if:  You have trouble breathing. This information is not intended to replace advice given to you by your health care provider. Make sure you discuss any questions you  have with your health care provider. Document Released: 03/05/2013 Document Revised: 10/18/2015 Document Reviewed: 09/04/2015 Elsevier Interactive Patient Education  2017 [place]. Bone Marrow Aspiration and Bone Marrow Biopsy, Adult, Care After This sheet gives you information about how to care for yourself after your procedure. Your health care provider may also give you more specific instructions. If you have problems or questions, contact your health care provider. What can I expect after the procedure? After the procedure, it is common to have:  Mild pain and tenderness.  Swelling.  Bruising. Follow these instructions at home:  Take over-the-counter or prescription medicines only as told by your health care provider.  Do not take baths, swim, or use a hot tub until your health care provider approves. Ask if you can take a shower or have a sponge bath.  Follow instructions from your health care provider about how to take care of the puncture site. Make sure you:  Wash your hands with soap and water before you change your bandage (dressing). If soap and water are not available, use hand sanitizer.  Change your dressing as told by your health care provider.  Check your puncture siteevery day for signs of infection. Check for:  More redness, swelling, or pain.  More fluid or blood.  Warmth.  Pus or a bad smell.  Return to your normal activities as told by your health care provider. Ask your health care provider what activities are safe for you.  Do not drive for 24 hours if you were given a medicine to help you relax (  sedative). °· Keep all follow-up visits as told by your health care provider. This is important. °Contact a health care provider if: °· You have more redness, swelling, or pain around the puncture site. °· You have more fluid or blood coming from the puncture site. °· Your puncture site feels warm to the touch. °· You have pus or a bad smell coming from the  puncture site. °· You have a fever. °· Your pain is not controlled with medicine. °This information is not intended to replace advice given to you by your health care provider. Make sure you discuss any questions you have with your health care provider. °Document Released: 12/02/2004 Document Revised: 12/03/2015 Document Reviewed: 10/27/2015 °Elsevier Interactive Patient Education © 2017 Elsevier Inc. ° °

## 2016-08-10 NOTE — Consult Note (Signed)
  Chief Complaint: Patient was seen in consultation today for CT-guided bone marrow biopsy  Referring Physician(s): Zhou,Louise  Supervising Physician: Watts, John  Patient Status: [facility] - Out-pt  History of Present Illness: Andrew [name] is a 76 y.o. male with history of progressive CLL/SLL/ bulky neck lymphadenopathy, and thrombocytopenia who presents today for CT-guided bone marrow biopsy for further evaluation.  Past Medical History:  Diagnosis Date  . CLL (chronic lymphocytic leukemia) (HCC)   . Dysrhythmia    Hx. of Atrial Fibrillation- has resolved  . Hypertension   . Pinched nerve in neck     Past Surgical History:  Procedure Laterality Date  . CATARACT EXTRACTION, BILATERAL    . COLONOSCOPY  07/10/2011   Procedure: COLONOSCOPY;  Surgeon: Sandi M Fields, MD;  Location: [facility];  Service: Endoscopy;  Laterality: N/A;  10:30 AM  . THYROIDECTOMY, PARTIAL    . TONSILLECTOMY      Allergies: Patient has no known allergies.  Medications: Prior to Admission medications   Medication Sig Start Date End Date Taking? Authorizing Provider  amLODipine (NORVASC) 10 MG tablet Take 10 mg by mouth daily.   Yes Historical Provider, MD  aspirin 325 MG tablet Take 325 mg by mouth daily.   Yes Historical Provider, MD  carvedilol (COREG) 12.5 MG tablet Take 12.5 mg by mouth 2 (two) times daily with a meal.   Yes Historical Provider, MD  Coenzyme Q10 (COQ10) 100 MG CAPS Take 1 tablet by mouth daily.   Yes Historical Provider, MD  fish oil-omega-3 fatty acids 1000 MG capsule Take 1 g by mouth 2 (two) times daily.   Yes Historical Provider, MD  Multiple Vitamin (MULITIVITAMIN WITH MINERALS) TABS Take 1 tablet by mouth daily.   Yes Historical Provider, MD  telmisartan-hydrochlorothiazide (MICARDIS HCT) 80-25 MG tablet Take 1 tablet by mouth daily.   Yes Historical Provider, MD  ibrutinib (IMBRUVICA) 140 MG capsul Take 3 capsules (420 mg total) by mouth daily. 08/01/16   Louise Zhou, MD   ondansetron (ZOFRAN) 8 MG tablet Take 1 tablet (8 mg total) by mouth every 8 (eight) hours as needed for nausea or vomiting. 08/03/16   Louise Zhou, MD  prochlorperazine (COMPAZINE) 10 MG tablet Take 1 tablet (10 mg total) by mouth every 6 (six) hours as needed for nausea or vomiting. 08/03/16   Louise Zhou, MD     Family History  Problem Relation Age of Onset  . Colon cancer Neg Hx     Social History   Social History  . Marital status: Married    Spouse name: N/A  . Number of children: N/A  . Years of education: N/A   Social History Main Topics  . Smoking status: Former Smoker    Packs/day: 1.00    Years: 15.00  . Smokeless tobacco: Never Used  . Alcohol use No  . Drug use: No  . Sexual activity: Not Asked   Other Topics Concern  . None   Social History Narrative  . None      Review of Systems denies fever, headache, chest pain, dyspnea, cough, abdominal/back pain, nausea, vomiting or abnormal bleeding.  Vital Signs: Blood pressure 138/91, heart rate 85, respirations 18, temp 98.1, O2 sat 95% room air   Physical Exam awake, alert. Cervical lymphadenopathy ;Chest clear to auscultation bilaterally. Heart with irreg rhythm , nl rate(prior hx afib). Abdomen soft, positive bowel sounds, nontender; lower extremities with no edema  Mallampati Score:     Imaging: Ct Abdomen   Pelvis Wo Contrast  Result Date: 08/03/2016 CLINICAL DATA:  CLL with cervical lymphadenopathy. EXAM: CT CHEST, ABDOMEN AND PELVIS WITHOUT CONTRAST TECHNIQUE: Multidetector CT imaging of the chest, abdomen and pelvis was performed following the standard protocol without IV contrast. COMPARISON:  CT chest 09/10/2014 FINDINGS: CT CHEST FINDINGS Cardiovascular: The heart size is normal. No pericardial effusion. Coronary artery calcification is noted. Atherosclerotic calcification is noted in the wall of the thoracic aorta. Mediastinum/Nodes: Bulky mediastinal lymphadenopathy is evident. Index AP window lymph  node measures 1.9 cm short axis diameter. Index subcarinal lymph node is 2.0 cm short axis. Hilar lymphadenopathy noted bilaterally, but difficult to measure given lack of intravenous contrast material. 1.9 cm retro esophageal short axis lymph node identified on image 51 series 3. Bulky lymphadenopathy is identified in both axillary regions. Index right axillary lymph node is 2.7 cm short axis. Index lymph node in the upper left axilla is 2.5 cm in short axis. Lungs/Pleura: Lungs are clear without focal airspace consolidation. No pulmonary nodule or mass. No pulmonary edema or pleural effusion. Musculoskeletal: Bone windows reveal no worrisome lytic or sclerotic osseous lesions. CT ABDOMEN PELVIS FINDINGS Hepatobiliary: Tiny subcentimeter hypodensities lateral segment left liver are unchanged in the interval and while incompletely characterize are likely tiny cysts. There is no evidence for gallstones, gallbladder wall thickening, or pericholecystic fluid. No intrahepatic or extrahepatic biliary dilation. Pancreas: No focal mass lesion. No dilatation of the main duct. No intraparenchymal cyst. No peripancreatic edema. Spleen: Spleen is 17 cm craniocaudal length. Adrenals/Urinary Tract: No adrenal nodule or mass. Two nonobstructing stones identified lower pole right kidney, measuring in the 3-4 mm size range. 7 mm nonobstructing stone identified lower pole left kidney. No ureteral stones. No bladder stones. Stomach/Bowel: Small hiatal hernia. Stomach otherwise unremarkable. Duodenum divert-set duodenal diverticulum evident. Duodenum is normally positioned as is the ligament of Treitz. No small bowel wall thickening. No small bowel dilatation. The terminal ileum is normal. The appendix is normal. Diverticular changes are noted in the left colon without evidence of diverticulitis. Vascular/Lymphatic: There is abdominal aortic atherosclerosis without aneurysm. 2.5 cm short axis left para- aortic lymph node is associated  with other retroperitoneal lymphadenopathy. Small lymph nodes are seen in the gastrohepatic and hepatoduodenal ligaments. Lymphadenopathy is identified in the gastrocolic ligament with 1.2 cm short axis lymph node anterior to the lateral segment left liver. Mesenteric lymphadenopathy is noted diffusely with 1.4 cm short axis ileocolic lymph node identified. Bulky lymphadenopathy is seen in the pelvis with a large 3.7 cm short axis left external iliac lymph node. 3.6 cm short axis right external iliac lymph node is associated with other extremely bulky pelvic sidewall lymphadenopathy noted bilaterally. Bulky lymphadenopathy is identified in both groin areas. Reproductive: Prostate gland is enlarged and generates mass-effect on the bladder base. Other: No intraperitoneal free fluid. Musculoskeletal: Several tiny lucent lesions are identified in the bony pelvis (12 mm lesion right iliac bone seen image 91 series 3, for example). No overtly sclerotic or destructive bony lesions evident. IMPRESSION: 1. Bulky lymphadenopathy in the chest, abdomen, and pelvis as described. 2. Splenomegaly. 3. Coronary artery and thoracoabdominal aortic atherosclerosis. 4. Prostatomegaly. 5. Several tiny lucent lesions in the bony anatomic pelvis, nonspecific, but attention on follow-up imaging recommended. Electronically Signed   By: Eric  Mansell M.D.   On: 08/03/2016 10:22   Ct Soft Tissue Neck Wo Contrast  Result Date: 08/03/2016 CLINICAL DATA:  CLL. EXAM: CT NECK WITHOUT CONTRAST TECHNIQUE: Multidetector CT imaging of the neck was performed following the   standard protocol without intravenous contrast. COMPARISON:  09/10/2014 FINDINGS: Pharynx and larynx: Normal. No mass or swelling. Salivary glands: No inflammation, mass, or stone. Thyroid: Right hemithyroidectomy. Lymph nodes: Bulky and diffuse lymphadenopathy. Pattern is stable from prior and in keeping with CLL. The degree of lymph node enlargement has progressed diffusely.  Submental lymph node measures 30 x 19 mm today, previously 20 x 13 mm. Left submental lymph node, 2:61, 25 x 13 mm today compared to 19 x 9 mm previously. Vascular: Atherosclerotic calcifications. Limited intracranial: Negative Visualized orbits: Bilateral cataract resection. Mastoids and visualized paranasal sinuses: Clear Skeleton: No acute or aggressive finding. Advanced facet arthropathy at C4-5 with mild anterolisthesis. Multilevel degenerative disc narrowing. Upper chest: Reported separately IMPRESSION: 1. Diffuse and bulky lymphadenopathy in keeping with history of CLL. Nodal enlargement has diffusely progressed since April 2016. 2. Chest and abdomen CT reported separately. Electronically Signed   By: Monte Fantasia M.D.   On: 08/03/2016 10:58   Ct Chest Wo Contrast  Result Date: 08/03/2016 CLINICAL DATA:  CLL with cervical lymphadenopathy. EXAM: CT CHEST, ABDOMEN AND PELVIS WITHOUT CONTRAST TECHNIQUE: Multidetector CT imaging of the chest, abdomen and pelvis was performed following the standard protocol without IV contrast. COMPARISON:  CT chest 09/10/2014 FINDINGS: CT CHEST FINDINGS Cardiovascular: The heart size is normal. No pericardial effusion. Coronary artery calcification is noted. Atherosclerotic calcification is noted in the wall of the thoracic aorta. Mediastinum/Nodes: Bulky mediastinal lymphadenopathy is evident. Index AP window lymph node measures 1.9 cm short axis diameter. Index subcarinal lymph node is 2.0 cm short axis. Hilar lymphadenopathy noted bilaterally, but difficult to measure given lack of intravenous contrast material. 1.9 cm retro esophageal short axis lymph node identified on image 51 series 3. Bulky lymphadenopathy is identified in both axillary regions. Index right axillary lymph node is 2.7 cm short axis. Index lymph node in the upper left axilla is 2.5 cm in short axis. Lungs/Pleura: Lungs are clear without focal airspace consolidation. No pulmonary nodule or mass. No  pulmonary edema or pleural effusion. Musculoskeletal: Bone windows reveal no worrisome lytic or sclerotic osseous lesions. CT ABDOMEN PELVIS FINDINGS Hepatobiliary: Tiny subcentimeter hypodensities lateral segment left liver are unchanged in the interval and while incompletely characterize are likely tiny cysts. There is no evidence for gallstones, gallbladder wall thickening, or pericholecystic fluid. No intrahepatic or extrahepatic biliary dilation. Pancreas: No focal mass lesion. No dilatation of the main duct. No intraparenchymal cyst. No peripancreatic edema. Spleen: Spleen is 17 cm craniocaudal length. Adrenals/Urinary Tract: No adrenal nodule or mass. Two nonobstructing stones identified lower pole right kidney, measuring in the 3-4 mm size range. 7 mm nonobstructing stone identified lower pole left kidney. No ureteral stones. No bladder stones. Stomach/Bowel: Small hiatal hernia. Stomach otherwise unremarkable. Duodenum divert-set duodenal diverticulum evident. Duodenum is normally positioned as is the ligament of Treitz. No small bowel wall thickening. No small bowel dilatation. The terminal ileum is normal. The appendix is normal. Diverticular changes are noted in the left colon without evidence of diverticulitis. Vascular/Lymphatic: There is abdominal aortic atherosclerosis without aneurysm. 2.5 cm short axis left para- aortic lymph node is associated with other retroperitoneal lymphadenopathy. Small lymph nodes are seen in the gastrohepatic and hepatoduodenal ligaments. Lymphadenopathy is identified in the gastrocolic ligament with 1.2 cm short axis lymph node anterior to the lateral segment left liver. Mesenteric lymphadenopathy is noted diffusely with 1.4 cm short axis ileocolic lymph node identified. Bulky lymphadenopathy is seen in the pelvis with a large 3.7 cm short axis left external  iliac lymph node. 3.6 cm short axis right external iliac lymph node is associated with other extremely bulky pelvic  sidewall lymphadenopathy noted bilaterally. Bulky lymphadenopathy is identified in both groin areas. Reproductive: Prostate gland is enlarged and generates mass-effect on the bladder base. Other: No intraperitoneal free fluid. Musculoskeletal: Several tiny lucent lesions are identified in the bony pelvis (12 mm lesion right iliac bone seen image 91 series 3, for example). No overtly sclerotic or destructive bony lesions evident. IMPRESSION: 1. Bulky lymphadenopathy in the chest, abdomen, and pelvis as described. 2. Splenomegaly. 3. Coronary artery and thoracoabdominal aortic atherosclerosis. 4. Prostatomegaly. 5. Several tiny lucent lesions in the bony anatomic pelvis, nonspecific, but attention on follow-up imaging recommended. Electronically Signed   By: Misty Stanley M.D.   On: 08/03/2016 10:22    Labs:  CBC:  Recent Labs  05/13/16 1212 06/01/16 0811 07/31/16 1435 08/10/16 0710  WBC 51.1* 60.7* 69.0* 78.7*  HGB 14.5 14.8 14.5 14.8  HCT 42.2 43.7 42.3 43.1  PLT 88* 100* 85* 96*    COAGS:  Recent Labs  08/10/16 0710  INR 1.00  APTT 27    BMP:  Recent Labs  05/01/16 1405 05/13/16 1212 06/01/16 0811 07/31/16 1435  NA 140 140 139 136  K 3.8 3.4* 4.0 3.5  CL 106 107 102 104  CO2 _0 GLUCOSE 134* 120* 113* 171*  BUN 24* 21* 25* 25*  CALCIUM 10.1 9.1 9.7 9.5  CREATININE 1.44* 1.39* 1.30* 1.45*  GFRNONAA 46* 48* 52* 45*  GFRAA 53* 55* 60* 52*    LIVER FUNCTION TESTS:  Recent Labs  05/01/16 1405 05/13/16 1212 06/01/16 0811 07/31/16 1435  BILITOT 1.0 1.8* 0.9 0.7  AST _1 ALT _2 ALKPHOS 50 62 51 56  PROT 6.8 6.6 6.5 6.6  ALBUMIN 4.4 4.0 4.2 4.2    TUMOR MARKERS: No results for input(s): AFPTM, CEA, CA199, CHROMGRNA in the last 8760 hours.  Assessment and Plan: 76 y.o. male with history of progressive CLL/SLL/ bulky neck lymphadenopathy, and thrombocytopenia who presents today for CT-guided bone marrow biopsy for further  evaluation.Risks and benefits discussed with the patient/spouse including, but not limited to bleeding, infection, damage to adjacent structures or low yield requiring additional tests. All of the patient's questions were answered, patient is agreeable to proceed. Consent signed and in chart.     Thank you for this interesting consult.  I greatly enjoyed meeting HARLESS MOLINARI and look forward to participating in their care.  A copy of this report was sent to the requesting provider on this date.  Electronically Signed: D. Rowe Robert 08/10/2016, 8:22 AM   I spent a total of  20 minutes   in face to face in clinical consultation, greater than 50% of which was counseling/coordinating care for CT guided bone marrow biopsy

## 2016-08-10 NOTE — Sedation Documentation (Signed)
Patient denies pain and is resting comfortably.  

## 2016-08-10 NOTE — Progress Notes (Signed)
Patient ID: Andrew [name], male   DOB: 25-Aug-1940, 76 y.o.   MRN: [id number] Pt's EKG today confirms afib with nl rate; pt asymptomatic.Lenis Dickinson with Dr. Talbert Cage made aware. Pt notified.

## 2016-08-10 NOTE — Procedures (Signed)
Pre-procedure Diagnosis: CLL Post-procedure Diagnosis: Same  Technically successful CT guided bone marrow aspiration and biopsy of left iliac crest.   Complications: None Immediate  EBL: None  SignedSandi Mariscal Pager: [phone] 08/10/2016, 9:56 AM

## 2016-08-10 NOTE — Progress Notes (Addendum)
CRITICAL VALUE ALERT  Critical value received:  WBC 78.7  Date of notification: 08/10/2016  Time of notification:  7:53 am   Critical value read back: Yes   Nurse who received alert:  Nash Dimmer RN   MD notified (Radiology PA) Rowe Robert  Time of first page:  7:54 am

## 2016-08-17 LAB — CHROMOSOME ANALYSIS, BONE MARROW

## 2016-08-21 ENCOUNTER — Other Ambulatory Visit (HOSPITAL_COMMUNITY): Payer: Self-pay | Admitting: Oncology

## 2016-08-21 DIAGNOSIS — C9110 Chronic lymphocytic leukemia of B-cell type not having achieved remission: Secondary | ICD-10-CM

## 2016-08-21 MED ORDER — IBRUTINIB 420 MG PO TABS: 420.0000 mg | ORAL_TABLET | Freq: Every day | ORAL | 0 refills | Status: DC

## 2016-08-22 ENCOUNTER — Encounter (HOSPITAL_COMMUNITY): Payer: Self-pay

## 2016-08-29 ENCOUNTER — Ambulatory Visit (HOSPITAL_COMMUNITY): Payer: Self-pay | Admitting: Oncology

## 2016-09-04 ENCOUNTER — Encounter (HOSPITAL_COMMUNITY): Payer: Medicare HMO

## 2016-09-04 ENCOUNTER — Encounter (HOSPITAL_COMMUNITY): Payer: Medicare HMO | Attending: Oncology | Admitting: Oncology

## 2016-09-04 ENCOUNTER — Encounter (HOSPITAL_COMMUNITY): Payer: Self-pay | Admitting: Oncology

## 2016-09-04 ENCOUNTER — Telehealth (HOSPITAL_COMMUNITY): Payer: Self-pay | Admitting: *Deleted

## 2016-09-04 VITALS — BP 117/71 | HR 95 | Resp 16 | Ht 74.0 in | Wt 225.4 lb

## 2016-09-04 DIAGNOSIS — R21 Rash and other nonspecific skin eruption: Secondary | ICD-10-CM

## 2016-09-04 DIAGNOSIS — C9190 Lymphoid leukemia, unspecified not having achieved remission: Secondary | ICD-10-CM | POA: Insufficient documentation

## 2016-09-04 DIAGNOSIS — C9110 Chronic lymphocytic leukemia of B-cell type not having achieved remission: Secondary | ICD-10-CM | POA: Diagnosis not present

## 2016-09-04 LAB — COMPREHENSIVE METABOLIC PANEL
ALBUMIN: 4.1 g/dL (ref 3.5–5.0)
ALT: 16 U/L — ABNORMAL LOW (ref 17–63)
ANION GAP: 7 (ref 5–15)
AST: 22 U/L (ref 15–41)
Alkaline Phosphatase: 41 U/L (ref 38–126)
BILIRUBIN TOTAL: 0.9 mg/dL (ref 0.3–1.2)
BUN: 26 mg/dL — ABNORMAL HIGH (ref 6–20)
CHLORIDE: 105 mmol/L (ref 101–111)
CO2: 26 mmol/L (ref 22–32)
Calcium: 9.5 mg/dL (ref 8.9–10.3)
Creatinine, Ser: 1.26 mg/dL — ABNORMAL HIGH (ref 0.61–1.24)
GFR calc Af Amer: >60 mL/min (ref >60)
GFR calc non Af Amer: 54 mL/min — ABNORMAL LOW (ref >60)
GLUCOSE: 90 mg/dL (ref 65–99)
Potassium: 3.9 mmol/L (ref 3.5–5.1)
Sodium: 138 mmol/L (ref 135–145)
TOTAL PROTEIN: 6.5 g/dL (ref 6.5–8.1)

## 2016-09-04 LAB — CBC WITH DIFFERENTIAL/PLATELET
BASOS ABS: 0 10*3/uL (ref 0.0–0.1)
Basophils Relative: 0 %
EOS ABS: 0 10*3/uL (ref 0.0–0.7)
Eosinophils Relative: 0 %
HEMATOCRIT: 44.7 % (ref 39.0–52.0)
Hemoglobin: 14.7 g/dL (ref 13.0–17.0)
LYMPHS ABS: 130.2 10*3/uL — AB (ref 0.7–4.0)
Lymphocytes Relative: 97 %
MCH: 31.5 pg (ref 26.0–34.0)
MCHC: 32.9 g/dL (ref 30.0–36.0)
MCV: 95.9 fL (ref 78.0–100.0)
MONO ABS: 1.3 10*3/uL — AB (ref 0.1–1.0)
MONOS PCT: 1 %
NEUTROS ABS: 2.7 10*3/uL (ref 1.7–7.7)
Neutrophils Relative %: 2 %
PLATELETS: 119 10*3/uL — AB (ref 150–400)
RBC: 4.66 MIL/uL (ref 4.22–5.81)
RDW: 13.3 % (ref 11.5–15.5)
WBC: 134.2 10*3/uL — AB (ref 4.0–10.5)

## 2016-09-04 NOTE — Patient Instructions (Addendum)
[place] at [facility] Discharge Instructions  RECOMMENDATIONS MADE BY THE CONSULTANT AND ANY TEST RESULTS WILL BE SENT TO YOUR REFERRING PHYSICIAN.  You were seen today by Kirby Crigler PA-C. Use Hydrocortisone cream for rash and itching. It is ok to go to dental appointment today. Return in 4 weeks for labs and follow up.    Thank you for choosing [place] at [facility] to provide your oncology and hematology care.  To afford each patient quality time with our provider, please arrive at least 15 minutes before your scheduled appointment time.    If you have a lab appointment with the [facility] please come in thru the  Main Entrance and check in at the main information desk  You need to re-schedule your appointment should you arrive 10 or more minutes late.  We strive to give you quality time with our providers, and arriving late affects you and other patients whose appointments are after yours.  Also, if you no show three or more times for appointments you may be dismissed from the clinic at the providers discretion.     Again, thank you for choosing [facility].  Our hope is that these requests will decrease the amount of time that you wait before being seen by our physicians.       _____________________________________________________________  Should you have questions after your visit to [facility], please contact our office at [phone] between the hours of 8:30 a.m. and 4:30 p.m.  Voicemails left after 4:30 p.m. will not be returned until the following business day.  For prescription refill requests, have your pharmacy contact our office.       Resources For Cancer Patients and their Caregivers ? American Cancer Society: Can assist with transportation, wigs, general needs, runs Look Good Feel Better.        838-558-0811 ? Cancer Care: Provides financial assistance, online support groups,  medication/co-pay assistance.  1-800-813-HOPE 229-342-1401) ? [facility] Assists [place] cancer patients and their families through emotional , educational and financial support.  409-302-8288 ? [place] DSS Where to apply for food stamps, Medicaid and utility assistance. 775-697-6081 ? [facility]: Transportation to medical appointments. 346 153 2244 ? Social Security Administration: May apply for disability if have a Stage IV cancer. (573)551-1877 (479)308-4396 ? [employer], Disability and Transit Services: Assists with nutrition, care and transit needs. [place] Support Programs: @10RELATIVEDAYS @ > Cancer Support Group  2nd Tuesday of the month 1pm-2pm, Journey Room  > Creative Journey  3rd Tuesday of the month 1130am-1pm, Journey Room  > Look Good Feel Better  1st Wednesday of the month 10am-12 noon, Journey Room (Call [facility] to register [phone])

## 2016-09-04 NOTE — Assessment & Plan Note (Addendum)
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  S/P bone marrow aspiration and biopsy on 08/10/2016 demonstrating marrow involvement of disease.  CT imaging demonstrating progressive bulky adenopathy.  CBC demonstrating progressive thrombocytopenia.  Therefore, he started Imbruvica 420 mg on 08/19/2016.   Oncology history developed.  Staging in [facility] problem list completed.  Labs today: CBC diff, CMET.  I personally reviewed and went over laboratory results with the patient.  The results are noted within this dictation.  WBC is elevated, above baseline.  Patient is provided eduction regarding acute elevation in WBC is a common side effect of treatment.  This will improve over the next 2-6 months.  I personally reviewed and went over radiographic studies with the patient.  The results are noted within this dictation.  I reviewed CT imaging with the patient.  I personally reviewed and went over pathology results with the patient.  Pathology from bone marrow aspiration and biopsy is reviewed in detail.  He has a dental appointment today.  He is free to have any dental work needed today.  A copy of labs will be provided to the patient today for his dental appointment if needed.  For his minimal facial rash, he can use hydrocortisone cream 1-2 times per day for pruritis (symptom control).  I have recommended sunscreen with SPF of 15-30 and re-apply according to provided directions.  He is to wear a large brimmed hat and cover his exposed body from the sun as much as possible.  Labs in 3-4 weeks: CBC diff, CMET, uric acid, phosphorus.  Problem list reviewed with patient and edited accordingly.  Medications are reviewed with the patient and edited accordingly.  Return in 3-4 weeks for follow-up.  More than 50% of the time spent with the patient was utilized for counseling and coordination of care.

## 2016-09-04 NOTE — Telephone Encounter (Signed)
Pt did not want to pay copay

## 2016-09-04 NOTE — Progress Notes (Signed)
CRITICAL VALUE ALERT Critical value received:  WBC- 134.2 Date of notification:  09-04-16 Time of notification: 9:18 Critical value read back:  Yes.   Nurse who received alert:  A.Darnelle Maffucci LPN MD notified (1st page):  Tom Kefalas PA-C.  Pt has appointment in clinic today.

## 2016-09-04 NOTE — Progress Notes (Signed)
Extensive teaching packet given

## 2016-09-04 NOTE — Progress Notes (Signed)
Wende Neighbors, MD [place] [address]  CLL (chronic lymphocytic leukemia) [facility]) - Plan: CBC with Differential, Comprehensive metabolic panel, Uric acid, Phosphorus  CURRENT THERAPY: Imbruvica 420 mg beginning on 08/19/2016  INTERVAL HISTORY: SHEEHAN STACEY 76 y.o. 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  S/P bone marrow aspiration and biopsy on 08/10/2016 demonstrating marrow involvement of disease.  CT imaging demonstrating progressive bulky adenopathy.  CBC demonstrating progressive thrombocytopenia.  Therefore, he started Imbruvica 420 mg on 08/19/2016.    CLL (chronic lymphocytic leukemia) ([place])   03/09/2015 Initial Diagnosis    CLL (chronic lymphocytic leukemia) ([place])     08/03/2016 Imaging    CT neck- 1. Diffuse and bulky lymphadenopathy in keeping with history of CLL. Nodal enlargement has diffusely progressed since April 2016. 2. Chest and abdomen CT reported separately.      08/03/2016 Imaging    CT CAP- 1. Bulky lymphadenopathy in the chest, abdomen, and pelvis as described. 2. Splenomegaly. 3. Coronary artery and thoracoabdominal aortic atherosclerosis. 4. Prostatomegaly. 5. Several tiny lucent lesions in the bony anatomic pelvis, nonspecific, but attention on follow-up imaging recommended.      08/10/2016 Procedure    Bone marrow aspiration and biopsy by IR.      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 PERIPHERAL BLOOD: - CHRONIC LYMPHOCYTIC LEUKEMIA. - THROMBOCYTOPENIA.      08/11/2016 Pathology Results    Bone Marrow Flow Cytometry - CHRONIC LYMPHOCYTIC LEUKEMIA/SMALL LYMPHOCYTIC LYMPHOMA.      08/16/2016 Pathology Results    Normal cytogenetics.  Molecular cytogenetic analysis by [facility] demonstrates a 13q-      08/19/2016 -  Chemotherapy    Imbruvica 420 mg daily        He is doing well.  He is tolerating therapy well.  He started on 08/19/2016.  He notes a minimal rash on his face.  He notes that it is minimally pruritic.  Otherwise, he is asymptomatic.    He asked about information provided from the delivering pharmacy of his improvement.  It is reported that he must wash towels and handle with gloves, flush toilet twice, etc. while on this medication.   He does not need to do this with this medication.  Skin protection is reviewed.  I recommended ongoing sunscreen and covering of body appropriately.  He is an appoint with the dentist today.  He is free to have dental work done.  Appetite is stable.  Weight is stable.  He denies any nausea or vomiting.  Review of Systems  Constitutional: Negative.  Negative for chills, fever and weight loss.  HENT: Negative.   Eyes: Negative.   Respiratory: Negative.  Negative for cough.   Cardiovascular: Negative.  Negative for chest pain.  Gastrointestinal: Negative.  Negative for blood in stool, constipation, diarrhea, melena, nausea and vomiting.  Genitourinary: Negative.   Musculoskeletal: Negative.   Skin: Positive for rash (small rash on face).  Neurological: Negative.  Negative for weakness.  Endo/Heme/Allergies: Negative.  Does not bruise/bleed easily.  Psychiatric/Behavioral: Negative.     Past Medical History:  Diagnosis Date  . CLL (chronic lymphocytic leukemia) ([place])   . Dysrhythmia    Hx. of Atrial Fibrillation- has resolved  . Hypertension   . Pinched nerve in neck     Past Surgical History:  Procedure Laterality Date  .  CATARACT EXTRACTION, BILATERAL    . COLONOSCOPY  07/10/2011   Procedure: COLONOSCOPY;  Surgeon: Dorothyann Peng, MD;  Location: [facility];  Service: Endoscopy;  Laterality: N/A;  10:30 AM  . THYROIDECTOMY, PARTIAL    . TONSILLECTOMY      Family History  Problem Relation Age of Onset  . Colon cancer Neg Hx     Social History   Social History  . Marital  status: Married    Spouse name: N/A  . Number of children: N/A  . Years of education: N/A   Social History Main Topics  . Smoking status: Former Smoker    Packs/day: 1.00    Years: 15.00  . Smokeless tobacco: Never Used  . Alcohol use No  . Drug use: No  . Sexual activity: Not Asked   Other Topics Concern  . None   Social History Narrative  . None     PHYSICAL EXAMINATION  ECOG PERFORMANCE STATUS: 0 - Asymptomatic  Vitals:   09/04/16 1632  BP: 117/71  Pulse: 95  Resp: 16    GENERAL:alert, no distress, well nourished, well developed, comfortable, cooperative, smiling and accompanied by wife. SKIN: skin color, texture, turgor are normal, no rashes or significant lesions.  Small right maxillary erythematous confluence and bridge of nose erythema.  Both with well demarcated borders. HEAD: Normocephalic, No masses, lesions, tenderness or abnormalities EYES: normal, EOMI, Conjunctiva are pink and non-injected EARS: External ears normal OROPHARYNX:lips, buccal mucosa, and tongue normal and mucous membranes are moist  NECK: supple, trachea midline, angle of jaw lymph node measuring 1 cm, right anterior, low cervical neck lymph node measuring 0.5 cm and right supraclavicular lymph nodes measuring 0.5 cm or smaller. LYMPH:  As above. BREAST:not examined LUNGS: clear to auscultation  HEART: regular rate & rhythm, no murmurs and no gallops ABDOMEN:abdomen soft and normal bowel sounds BACK: Back symmetric, no curvature. EXTREMITIES:less then 2 second capillary refill, no joint deformities, effusion, or inflammation, no skin discoloration, no cyanosis  NEURO: alert & oriented x 3 with fluent speech, no focal motor/sensory deficits, gait normal   LABORATORY DATA: CBC    Component Value Date/Time   WBC 134.2 (HH) 09/04/2016 0847   RBC 4.66 09/04/2016 0847   HGB 14.7 09/04/2016 0847   HCT 44.7 09/04/2016 0847   PLT 119 (L) 09/04/2016 0847   MCV 95.9 09/04/2016 0847   MCH  31.5 09/04/2016 0847   MCHC 32.9 09/04/2016 0847   RDW 13.3 09/04/2016 0847   LYMPHSABS 130.2 (H) 09/04/2016 0847   MONOABS 1.3 (H) 09/04/2016 0847   EOSABS 0.0 09/04/2016 0847   BASOSABS 0.0 09/04/2016 0847      Chemistry      Component Value Date/Time   NA 138 09/04/2016 0847   K 3.9 09/04/2016 0847   CL 105 09/04/2016 0847   CO2 26 09/04/2016 0847   BUN 26 (H) 09/04/2016 0847   CREATININE 1.26 (H) 09/04/2016 0847      Component Value Date/Time   CALCIUM 9.5 09/04/2016 0847   ALKPHOS 41 09/04/2016 0847   AST 22 09/04/2016 0847   ALT 16 (L) 09/04/2016 0847   BILITOT 0.9 09/04/2016 0847        PENDING LABS:   RADIOGRAPHIC STUDIES:  Ct Biopsy  Result Date: 08/10/2016 INDICATION: History of CLL, now with worsening thrombocytopenia. Please perform CT-guided bone marrow biopsy and aspiration for tissue diagnostic purposes. EXAM: CT-GUIDED BONE MARROW BIOPSY AND ASPIRATION MEDICATIONS: None ANESTHESIA/SEDATION: Fentanyl 100 mcg IV; Versed 3  mg IV Sedation Time: 10 minutes; The patient was continuously monitored during the procedure by the interventional radiology nurse under my direct supervision. COMPLICATIONS: None immediate. PROCEDURE: Informed consent was obtained from the patient following an explanation of the procedure, risks, benefits and alternatives. The patient understands, agrees and consents for the procedure. All questions were addressed. A time out was performed prior to the initiation of the procedure. The patient was positioned prone and non-contrast localization CT was performed of the pelvis to demonstrate the iliac marrow spaces. The operative site was prepped and draped in the usual sterile fashion. Under sterile conditions and local anesthesia, a 22 gauge spinal needle was utilized for procedural planning. Next, an 11 gauge coaxial bone biopsy needle was advanced into the left iliac marrow space. Needle position was confirmed with CT imaging. Initially, bone  marrow aspiration was performed. Next, a bone marrow biopsy was obtained with the 11 gauge outer bone marrow device. Samples were prepared with the cytotechnologist and deemed adequate. The needle was removed intact. Hemostasis was obtained with compression and a dressing was placed. The patient tolerated the procedure well without immediate post procedural complication. IMPRESSION: Successful CT guided left iliac bone marrow aspiration and core biopsy. Electronically Signed   By: Sandi Mariscal M.D.   On: 08/10/2016 10:37   Ct Bone Marrow Biopsy & Aspiration  Result Date: 08/10/2016 INDICATION: History of CLL, now with worsening thrombocytopenia. Please perform CT-guided bone marrow biopsy and aspiration for tissue diagnostic purposes. EXAM: CT-GUIDED BONE MARROW BIOPSY AND ASPIRATION MEDICATIONS: None ANESTHESIA/SEDATION: Fentanyl 100 mcg IV; Versed 3 mg IV Sedation Time: 10 minutes; The patient was continuously monitored during the procedure by the interventional radiology nurse under my direct supervision. COMPLICATIONS: None immediate. PROCEDURE: Informed consent was obtained from the patient following an explanation of the procedure, risks, benefits and alternatives. The patient understands, agrees and consents for the procedure. All questions were addressed. A time out was performed prior to the initiation of the procedure. The patient was positioned prone and non-contrast localization CT was performed of the pelvis to demonstrate the iliac marrow spaces. The operative site was prepped and draped in the usual sterile fashion. Under sterile conditions and local anesthesia, a 22 gauge spinal needle was utilized for procedural planning. Next, an 11 gauge coaxial bone biopsy needle was advanced into the left iliac marrow space. Needle position was confirmed with CT imaging. Initially, bone marrow aspiration was performed. Next, a bone marrow biopsy was obtained with the 11 gauge outer bone marrow device. Samples  were prepared with the cytotechnologist and deemed adequate. The needle was removed intact. Hemostasis was obtained with compression and a dressing was placed. The patient tolerated the procedure well without immediate post procedural complication. IMPRESSION: Successful CT guided left iliac bone marrow aspiration and core biopsy. Electronically Signed   By: Sandi Mariscal M.D.   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  S/P bone marrow aspiration and biopsy on 08/10/2016 demonstrating marrow involvement of disease.  CT imaging demonstrating progressive bulky adenopathy.  CBC demonstrating progressive thrombocytopenia.  Therefore, he started Imbruvica 420 mg on 08/19/2016.   Oncology history developed.  Staging in [facility] problem list completed.  Labs today: CBC diff, CMET.  I personally reviewed and went over laboratory results with the patient.  The results are noted within this dictation.  WBC is elevated, above baseline.  Patient  is provided eduction regarding acute elevation in WBC is a common side effect of treatment.  This will improve over the next 2-6 months.  I personally reviewed and went over radiographic studies with the patient.  The results are noted within this dictation.  I reviewed CT imaging with the patient.  I personally reviewed and went over pathology results with the patient.  Pathology from bone marrow aspiration and biopsy is reviewed in detail.  He has a dental appointment today.  He is free to have any dental work needed today.  A copy of labs will be provided to the patient today for his dental appointment if needed.  For his minimal facial rash, he can use hydrocortisone cream 1-2 times per day for pruritis (symptom control).  I have recommended sunscreen with SPF of 15-30 and re-apply according to provided directions.  He is to wear a large brimmed hat and cover  his exposed body from the sun as much as possible.  Labs in 3-4 weeks: CBC diff, CMET, uric acid, phosphorus.  Problem list reviewed with patient and edited accordingly.  Medications are reviewed with the patient and edited accordingly.  Return in 3-4 weeks for follow-up.  More than 50% of the time spent with the patient was utilized for counseling and coordination of care.    ORDERS PLACED FOR THIS ENCOUNTER: Orders Placed This Encounter  Procedures  . CBC with Differential  . Comprehensive metabolic panel  . Uric acid  . Phosphorus    MEDICATIONS PRESCRIBED THIS ENCOUNTER: Meds ordered this encounter  Medications  . IMBRUVICA 140 MG tablet    THERAPY PLAN:  Continue Imbruvica 420 mg until disease progression or intolerance.  All questions were answered. The patient knows to call the clinic with any problems, questions or concerns. We can certainly see the patient much sooner if necessary.  Patient and plan discussed with Dr. Twana First and she is in agreement with the aforementioned.   This note is electronically signed by: Doy Mince 09/04/2016 5:16 PM

## 2016-09-05 ENCOUNTER — Ambulatory Visit (HOSPITAL_COMMUNITY): Payer: Self-pay | Admitting: Oncology

## 2016-09-11 DIAGNOSIS — L718 Other rosacea: Secondary | ICD-10-CM | POA: Diagnosis not present

## 2016-09-11 DIAGNOSIS — L821 Other seborrheic keratosis: Secondary | ICD-10-CM | POA: Diagnosis not present

## 2016-09-11 DIAGNOSIS — L820 Inflamed seborrheic keratosis: Secondary | ICD-10-CM | POA: Diagnosis not present

## 2016-09-11 DIAGNOSIS — L568 Other specified acute skin changes due to ultraviolet radiation: Secondary | ICD-10-CM | POA: Diagnosis not present

## 2016-10-03 ENCOUNTER — Other Ambulatory Visit (HOSPITAL_COMMUNITY): Payer: Self-pay | Admitting: Oncology

## 2016-10-03 DIAGNOSIS — C9110 Chronic lymphocytic leukemia of B-cell type not having achieved remission: Secondary | ICD-10-CM

## 2016-10-04 ENCOUNTER — Ambulatory Visit (HOSPITAL_COMMUNITY): Payer: Medicare HMO

## 2016-10-04 ENCOUNTER — Ambulatory Visit (HOSPITAL_COMMUNITY): Payer: Medicare HMO | Admitting: Adult Health

## 2016-10-04 ENCOUNTER — Other Ambulatory Visit (HOSPITAL_COMMUNITY): Payer: Medicare HMO

## 2016-10-04 NOTE — Progress Notes (Addendum)
Andrew [name], [place] [address]   CLINIC:  Medical Oncology/Hematology  PCP:  Andrew Squibb, MD [place] [address] [phone]   REASON FOR VISIT:  Follow-up for CLL  CURRENT THERAPY: Imbruvica 420 mg, beginning 08/19/16   BRIEF ONCOLOGIC HISTORY:    CLL (chronic lymphocytic leukemia) ([place] [place])   03/09/2015 Initial Diagnosis    CLL (chronic lymphocytic leukemia) ([place])     08/03/2016 Imaging    CT neck- 1. Diffuse and bulky lymphadenopathy in keeping with history of CLL. Nodal enlargement has diffusely progressed since April 2016. 2. Chest and abdomen CT reported separately.      08/03/2016 Imaging    CT CAP- 1. Bulky lymphadenopathy in the chest, abdomen, and pelvis as described. 2. Splenomegaly. 3. Coronary artery and thoracoabdominal aortic atherosclerosis. 4. Prostatomegaly. 5. Several tiny lucent lesions in the bony anatomic pelvis, nonspecific, but attention on follow-up imaging recommended.      08/10/2016 Procedure    Bone marrow aspiration and biopsy by IR.      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 PERIPHERAL BLOOD: - CHRONIC LYMPHOCYTIC LEUKEMIA. - THROMBOCYTOPENIA.      08/11/2016 Pathology Results    Bone Marrow Flow Cytometry - CHRONIC LYMPHOCYTIC LEUKEMIA/SMALL LYMPHOCYTIC LYMPHOMA.      08/16/2016 Pathology Results    Normal cytogenetics.  Molecular cytogenetic analysis by Andrew [name] demonstrates a 13q-      08/19/2016 -  Chemotherapy    Imbruvica 420 mg daily         HISTORY OF PRESENT ILLNESS:  (From Andrew Crigler, [name]-C's last note on 09/04/16)     INTERVAL HISTORY:  [name] 76 y.o. male returns for follow-up for CLL, currently on Imbruvica 420 mg daily, since 08/19/16.   Overall, he tells me that he feels pretty well. Appetite is 100%; energy levels are  75%. He recently had a lesion on his left lower lip removed by his dermatologist; it is still bothering him. He has an appt to see the dermatologist later today.    His only new complaint since starting Imbruvica is headache. States that it started about 3 days ago and was unilateral to right frontal/temporal region. Notes increased light sensitivity; no sound sensitivity. He tried Tylenol, which was not effective. He was told not to take any aspirin or other NSAIDs because of his leukemia.  Denies any facial droop, speech changes, dizziness, or asymmetric upper extremity weakness/muscle strength.  The headache is a bit improved today.   He has questions regarding sunscreen. He states that he has questions about whether he really needs to wear sunscreen "because I have always tanned really easily and I don't ever get sunburned."     REVIEW OF SYSTEMS:  Review of Systems  Constitutional: Negative.  Negative for chills, fatigue and fever.  HENT:  Negative.  Negative for lump/mass and nosebleeds.        Left lower lip sore   Eyes: Negative.   Respiratory: Negative.  Negative for cough and shortness of breath.   Cardiovascular: Negative.  Negative for chest pain and leg swelling.  Gastrointestinal: Negative.  Negative for abdominal pain, blood in stool, constipation, diarrhea, nausea and vomiting.  Endocrine: Negative.   Genitourinary: Negative.  Negative for dysuria and hematuria.   Musculoskeletal: Negative.  Negative for arthralgias.  Skin: Negative.  Negative for rash.  Neurological: Positive for  headaches. Negative for dizziness.  Hematological: Negative.  Negative for adenopathy. Does not bruise/bleed easily.  Psychiatric/Behavioral: Negative.  Negative for depression and sleep disturbance. The patient is not nervous/anxious.       PAST MEDICAL/SURGICAL HISTORY:  Past Medical History:  Diagnosis Date  . CLL (chronic lymphocytic leukemia) ([place])   . Dysrhythmia    Hx. of Atrial  Fibrillation- has resolved  . Hypertension   . Pinched nerve in neck    Past Surgical History:  Procedure Laterality Date  . CATARACT EXTRACTION, BILATERAL    . COLONOSCOPY  07/10/2011   Procedure: COLONOSCOPY;  Surgeon: Andrew Peng, MD;  Location: [facility];  Service: Endoscopy;  Laterality: N/A;  10:30 AM  . THYROIDECTOMY, PARTIAL    . TONSILLECTOMY       SOCIAL HISTORY:  Social History   Social History  . Marital status: Married    Spouse name: N/A  . Number of children: N/A  . Years of education: N/A   Occupational History  . Not on file.   Social History Main Topics  . Smoking status: Former Smoker    Packs/day: 1.00    Years: 15.00  . Smokeless tobacco: Never Used  . Alcohol use No  . Drug use: No  . Sexual activity: Not on file   Other Topics Concern  . Not on file   Social History Narrative  . No narrative on file    FAMILY HISTORY:  Family History  Problem Relation Age of Onset  . Colon cancer Neg Hx     CURRENT MEDICATIONS:  Outpatient Encounter Prescriptions as of 10/05/2016  Medication Sig  . amLODipine (NORVASC) 10 MG tablet Take 10 mg by mouth daily.  Marland Kitchen aspirin 325 MG tablet Take 325 mg by mouth daily.  . carvedilol (COREG) 12.5 MG tablet Take 12.5 mg by mouth 2 (two) times daily with a meal.  . Coenzyme Q10 (COQ10) 100 MG CAPS Take 1 tablet by mouth daily.  . fish oil-omega-3 fatty acids 1000 MG capsule Take 1 g by mouth 2 (two) times daily.  . IMBRUVICA 420 MG TABS TAKE ONE TABLET BY MOUTH DAILY.  . Multiple Vitamin (MULITIVITAMIN WITH MINERALS) TABS Take 1 tablet by mouth daily.  Marland Kitchen telmisartan-hydrochlorothiazide (MICARDIS HCT) 80-25 MG tablet Take 1 tablet by mouth daily.  . butalbital-acetaminophen-caffeine (FIORICET, ESGIC) 50-325-40 MG tablet Take 1-2 tablets by mouth every 6 (six) hours as needed for headache.  . ondansetron (ZOFRAN) 8 MG tablet Take 1 tablet (8 mg total) by mouth every 8 (eight) hours as needed for nausea or  vomiting. (Patient not taking: Reported on 10/05/2016)  . prochlorperazine (COMPAZINE) 10 MG tablet Take 1 tablet (10 mg total) by mouth every 6 (six) hours as needed for nausea or vomiting. (Patient not taking: Reported on 09/04/2016)   No facility-administered encounter medications on file as of 10/05/2016.     ALLERGIES:  No Known Allergies   PHYSICAL EXAM:  ECOG Performance status: 1 - Symptomatic, but independent.   Vitals:   10/05/16 0859  BP: (!) 99/52  Pulse: (!) 53  Resp: 18  Temp: 97.9 F (36.6 C)   Filed Weights   10/05/16 0859  Weight: 228 lb (103.4 kg)    Physical Exam  Constitutional: He is oriented to person, place, and time and well-developed, well-nourished, and in no distress.  HENT:  Head: Normocephalic.  Mouth/Throat: Oropharynx is clear and moist. No oropharyngeal exudate.  (L) lower lip lesion s/p recent removal by dermatology.  Eyes: Conjunctivae are normal. Pupils are equal, round, and reactive to light. No scleral icterus.  Neck: Normal range of motion. Neck supple.  Cardiovascular: Normal rate and regular rhythm.   Pulmonary/Chest: Effort normal and breath sounds normal. No respiratory distress. He has no wheezes.  Abdominal: Soft. Bowel sounds are normal. There is no tenderness. There is no rebound and no guarding.  Musculoskeletal: Normal range of motion. He exhibits no edema.  Lymphadenopathy:    He has cervical adenopathy (L) posterior cervical adenopathy along cervical chain ~0.5-1 cm in max dimension; (R) posterior adenopathy only very subtle .  Neurological: He is alert and oriented to person, place, and time. No cranial nerve deficit. Gait normal.  Skin: Skin is warm and dry. No rash noted.  Psychiatric: Mood, memory, affect and judgment normal.  Nursing note and vitals reviewed.    LABORATORY DATA:  I have reviewed the labs as listed.  CBC    Component Value Date/Time   WBC 140.9 (HH) 10/05/2016 0821   RBC 4.63 10/05/2016 0821    HGB 14.5 10/05/2016 0821   HCT 44.4 10/05/2016 0821   PLT 136 (L) 10/05/2016 0821   MCV 95.9 10/05/2016 0821   MCH 31.3 10/05/2016 0821   MCHC 32.7 10/05/2016 0821   RDW 13.1 10/05/2016 0821   LYMPHSABS 129.6 (H) 10/05/2016 0821   MONOABS 2.8 (H) 10/05/2016 0821   EOSABS 0.0 10/05/2016 0821   BASOSABS 0.0 10/05/2016 0821   CMP Latest Ref Rng & Units 10/05/2016 09/04/2016 07/31/2016  Glucose 65 - 99 mg/dL 167(H) 90 171(H)  BUN 6 - 20 mg/dL 21(H) 26(H) 25(H)  Creatinine 0.61 - 1.24 mg/dL 1.26(H) 1.26(H) 1.45(H)  Sodium 135 - 145 mmol/L 139 138 136  Potassium 3.5 - 5.1 mmol/L 3.6 3.9 3.5  Chloride 101 - 111 mmol/L 103 105 104  CO2 22 - 32 mmol/L '29 26 25  ' Calcium 8.9 - 10.3 mg/dL 9.4 9.5 9.5  Total Protein 6.5 - 8.1 g/dL 6.3(L) 6.5 6.6  Total Bilirubin 0.3 - 1.2 mg/dL 1.4(H) 0.9 0.7  Alkaline Phos 38 - 126 U/L 47 41 56  AST 15 - 41 U/L '23 22 31  ' ALT 17 - 63 U/L 16(L) 16(L) 19    PENDING LABS:    DIAGNOSTIC IMAGING:  *The following radiologic images and reports have been reviewed independently and agree with below findings.  CT neck: 08/03/16     CT chest/abd/pelvis: 08/03/16        PATHOLOGY:  Bone marrow biopsy: 08/10/16          ASSESSMENT & PLAN:   CLL:  -CT neck/chest/abd/pelvis on 08/03/16 showed progressive adenopathy. 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 Progressive thrombocytopenia appreciated with platelets <100,000. Treatment initiated with Imbruvica 420 mg daily on 08/19/16.  -Alert WBCs 140.9 today, as expected. We reviewed that it may take several months before white blood cell count decreases.  Platelets are mildly improved at 136,000 today, which shows clinical improvement in his CLL thus far.  -Continue Imbruvica 420 mg daily.  -Uric acid mildly elevated; phosphorus normal. Will start Allopurinol 300 mg BID for risk of tumor lysis syndrome.  -Will also start prophylactic varicella zoster coverage with Acyclovir 400 mg BID, which should be  continued will taking Imbruvica therapy.  -Return to cancer center in 6 weeks for follow-up and labs.   Headache:  -Could be secondary to Imbruvica (headache noted to be adverse reaction in 12-18% of patients on Imbruvica).  -Unilateral right-sided headache with light sensitivity highly  suggestive of migraine. No relief with Tylenol. Unable to take aspirin, NSAIDs, or other OTC agents d/t his CLL.  I have low clinical suspicion for malignant etiology of this reported headache episode.  -Will give prescription for Fioricet 1-2 tabs PRN. Instructed patient to call us if headache returns or worsens, and we can consider imaging his brain at that time. He agreed with this plan.   Mild hypotension:  -No postural or orthostatic hypotension symptoms. He is taking his anti-hypertensives as prescribed.  -Recommended he increase oral fluid intake, as this may help improve his mildly elevated BUN/creatinine and mildly low EGFR at 54. This may help improve his hypotension that could be secondary to diuresis from BP medications.   Sun safety:  -Oral chemotherapy can cause photosensitivity.   -Stressed the importance of sun safety (wearing sunscreen SPF 15-30 daily and re-applying PRN) & wearing sun-protective clothing. He was provided written instructions re: sun safety today.   Lip lesion:  -Continue follow-up with dermatology, as directed.       Dispo:  -Return to cancer center in 6 weeks for continued follow-up and labs.      All questions were answered to patient's stated satisfaction. Encouraged patient to call with any new concerns or questions before his next visit to the cancer center and we can certain see him sooner, if needed.    Plan of care discussed with Dr. Talbert Cage, who agrees with the above aforementioned.    Orders placed this encounter:  Orders Placed This Encounter  Procedures  . CBC with Differential/Platelet  . Comprehensive metabolic panel  . Uric acid  . Phosphorus       Mike Craze, NP [place] [phone]

## 2016-10-05 ENCOUNTER — Encounter (HOSPITAL_COMMUNITY): Payer: Medicare HMO | Attending: Oncology

## 2016-10-05 ENCOUNTER — Other Ambulatory Visit (HOSPITAL_COMMUNITY): Payer: Self-pay | Admitting: Oncology

## 2016-10-05 ENCOUNTER — Encounter (HOSPITAL_COMMUNITY): Payer: Self-pay | Admitting: Adult Health

## 2016-10-05 ENCOUNTER — Encounter (HOSPITAL_BASED_OUTPATIENT_CLINIC_OR_DEPARTMENT_OTHER): Payer: Medicare HMO | Admitting: Adult Health

## 2016-10-05 VITALS — BP 99/52 | HR 53 | Temp 97.9°F | Resp 18 | Ht 73.0 in | Wt 228.0 lb

## 2016-10-05 DIAGNOSIS — I959 Hypotension, unspecified: Secondary | ICD-10-CM

## 2016-10-05 DIAGNOSIS — I10 Essential (primary) hypertension: Secondary | ICD-10-CM | POA: Diagnosis not present

## 2016-10-05 DIAGNOSIS — N400 Enlarged prostate without lower urinary tract symptoms: Secondary | ICD-10-CM | POA: Insufficient documentation

## 2016-10-05 DIAGNOSIS — L980 Pyogenic granuloma: Secondary | ICD-10-CM | POA: Diagnosis not present

## 2016-10-05 DIAGNOSIS — Z87891 Personal history of nicotine dependence: Secondary | ICD-10-CM | POA: Diagnosis not present

## 2016-10-05 DIAGNOSIS — R51 Headache: Secondary | ICD-10-CM | POA: Insufficient documentation

## 2016-10-05 DIAGNOSIS — C9110 Chronic lymphocytic leukemia of B-cell type not having achieved remission: Secondary | ICD-10-CM

## 2016-10-05 DIAGNOSIS — D696 Thrombocytopenia, unspecified: Secondary | ICD-10-CM

## 2016-10-05 DIAGNOSIS — G4440 Drug-induced headache, not elsewhere classified, not intractable: Secondary | ICD-10-CM

## 2016-10-05 DIAGNOSIS — K130 Diseases of lips: Secondary | ICD-10-CM

## 2016-10-05 LAB — COMPREHENSIVE METABOLIC PANEL
ALBUMIN: 4 g/dL (ref 3.5–5.0)
ALK PHOS: 47 U/L (ref 38–126)
ALT: 16 U/L — ABNORMAL LOW (ref 17–63)
AST: 23 U/L (ref 15–41)
Anion gap: 7 (ref 5–15)
BILIRUBIN TOTAL: 1.4 mg/dL — AB (ref 0.3–1.2)
BUN: 21 mg/dL — AB (ref 6–20)
CALCIUM: 9.4 mg/dL (ref 8.9–10.3)
CO2: 29 mmol/L (ref 22–32)
Chloride: 103 mmol/L (ref 101–111)
Creatinine, Ser: 1.26 mg/dL — ABNORMAL HIGH (ref 0.61–1.24)
GFR calc Af Amer: >60 mL/min (ref >60)
GFR, EST NON AFRICAN AMERICAN: 54 mL/min — AB (ref >60)
GLUCOSE: 167 mg/dL — AB (ref 65–99)
POTASSIUM: 3.6 mmol/L (ref 3.5–5.1)
Sodium: 139 mmol/L (ref 135–145)
TOTAL PROTEIN: 6.3 g/dL — AB (ref 6.5–8.1)

## 2016-10-05 LAB — CBC WITH DIFFERENTIAL/PLATELET
BASOS PCT: 0 %
Basophils Absolute: 0 10*3/uL (ref 0.0–0.1)
EOS PCT: 0 %
Eosinophils Absolute: 0 10*3/uL (ref 0.0–0.7)
HEMATOCRIT: 44.4 % (ref 39.0–52.0)
HEMOGLOBIN: 14.5 g/dL (ref 13.0–17.0)
LYMPHS PCT: 92 %
Lymphs Abs: 129.6 10*3/uL — ABNORMAL HIGH (ref 0.7–4.0)
MCH: 31.3 pg (ref 26.0–34.0)
MCHC: 32.7 g/dL (ref 30.0–36.0)
MCV: 95.9 fL (ref 78.0–100.0)
MONOS PCT: 2 %
Monocytes Absolute: 2.8 10*3/uL — ABNORMAL HIGH (ref 0.1–1.0)
NEUTROS PCT: 6 %
Neutro Abs: 8.5 10*3/uL — ABNORMAL HIGH (ref 1.7–7.7)
Platelets: 136 10*3/uL — ABNORMAL LOW (ref 150–400)
RBC: 4.63 MIL/uL (ref 4.22–5.81)
RDW: 13.1 % (ref 11.5–15.5)
WBC: 140.9 10*3/uL — AB (ref 4.0–10.5)

## 2016-10-05 LAB — URIC ACID: URIC ACID, SERUM: 7.8 mg/dL — AB (ref 4.4–7.6)

## 2016-10-05 LAB — PHOSPHORUS: Phosphorus: 3.6 mg/dL (ref 2.5–4.6)

## 2016-10-05 MED ORDER — BUTALBITAL-APAP-CAFFEINE 50-325-40 MG PO TABS: 1.0000 | ORAL_TABLET | Freq: Four times a day (QID) | ORAL | 0 refills | Status: DC | PRN

## 2016-10-05 MED ORDER — ACYCLOVIR 400 MG PO TABS: 400.0000 mg | ORAL_TABLET | Freq: Two times a day (BID) | ORAL | 2 refills | Status: DC

## 2016-10-05 MED ORDER — ALLOPURINOL 300 MG PO TABS: 300.0000 mg | ORAL_TABLET | Freq: Two times a day (BID) | ORAL | 2 refills | Status: DC

## 2016-10-05 NOTE — Patient Instructions (Addendum)
[place] at [facility] Discharge Instructions  RECOMMENDATIONS MADE BY THE CONSULTANT AND ANY TEST RESULTS WILL BE SENT TO YOUR REFERRING PHYSICIAN.  You were seen today by Mike Craze NP. Use 15- 30 SPF sunscreen when in the sun, reapply as directed. Return in 6 weeks for labs and follow up.    Thank you for choosing [place] at [facility] to provide your oncology and hematology care.  To afford each patient quality time with our provider, please arrive at least 15 minutes before your scheduled appointment time.    If you have a lab appointment with the [facility] please come in thru the  Main Entrance and check in at the main information desk  You need to re-schedule your appointment should you arrive 10 or more minutes late.  We strive to give you quality time with our providers, and arriving late affects you and other patients whose appointments are after yours.  Also, if you no show three or more times for appointments you may be dismissed from the clinic at the providers discretion.     Again, thank you for choosing [facility].  Our hope is that these requests will decrease the amount of time that you wait before being seen by our physicians.       _____________________________________________________________  Should you have questions after your visit to [facility], please contact our office at [phone] between the hours of 8:30 a.m. and 4:30 p.m.  Voicemails left after 4:30 p.m. will not be returned until the following business day.  For prescription refill requests, have your pharmacy contact our office.       Resources For Cancer Patients and their Caregivers ? American Cancer Society: Can assist with transportation, wigs, general needs, runs Look Good Feel Better.        [phone] ? Cancer Care: Provides financial assistance, online support groups, medication/co-pay assistance.   1-800-813-HOPE 9152516939) ? [place] Assists [place] cancer patients and their families through emotional , educational and financial support.  561-535-3734 ? [place] DSS Where to apply for food stamps, Medicaid and utility assistance. (825)767-4233 ? [facility]: Transportation to medical appointments. 539-675-7996 ? Social Security Administration: May apply for disability if have a Stage IV cancer. 405-118-3895 223-760-6749 ? [employer], Disability and Transit Services: Assists with nutrition, care and transit needs. [place] Support Programs: @10RELATIVEDAYS @ > Cancer Support Group  2nd Tuesday of the month 1pm-2pm, Journey Room  > Creative Journey  3rd Tuesday of the month 1130am-1pm, Journey Room  > Look Good Feel Better  1st Wednesday of the month 10am-12 noon, Journey Room (Call [facility] to register [phone])

## 2016-10-05 NOTE — Progress Notes (Signed)
CRITICAL VALUE ALERT Critical value received:  WBC 140.9 Date of notification:  10/05/2016 Time of notification: 0824 Critical value read back:  Yes.   Nurse who received alert:  Shellia Carwin RN MD notified (1st page):  Mike Craze NP

## 2016-10-09 ENCOUNTER — Telehealth (HOSPITAL_COMMUNITY): Payer: Self-pay | Admitting: Emergency Medicine

## 2016-10-09 ENCOUNTER — Other Ambulatory Visit (HOSPITAL_COMMUNITY): Payer: Self-pay | Admitting: Oncology

## 2016-10-09 DIAGNOSIS — G43909 Migraine, unspecified, not intractable, without status migrainosus: Secondary | ICD-10-CM

## 2016-10-09 NOTE — Telephone Encounter (Signed)
Andrew [name] called to let me know that his migraine had not improved much.  He has been taking 2 tablets every 6 hours since Thursday.  Spoke with Kirby Crigler PA and he ordered a MRI of the brain.  Appt is May 16th at 5:45 pm.  Pt verbalized understanding.  Explained that if this is normal we may need to hold imbruvica for a bit to see if the migraine improves.

## 2016-10-11 ENCOUNTER — Other Ambulatory Visit: Payer: Self-pay

## 2016-10-11 ENCOUNTER — Emergency Department (HOSPITAL_COMMUNITY): Payer: Medicare HMO

## 2016-10-11 ENCOUNTER — Ambulatory Visit (HOSPITAL_COMMUNITY): Payer: Medicare HMO

## 2016-10-11 ENCOUNTER — Inpatient Hospital Stay (HOSPITAL_COMMUNITY)
Admission: EM | Admit: 2016-10-11 | Discharge: 2016-10-18 | DRG: 026 | Disposition: A | Discharge status: Home / Self Care | Payer: Medicare HMO | Attending: Internal Medicine | Admitting: Internal Medicine

## 2016-10-11 DIAGNOSIS — Z7982 Long term (current) use of aspirin: Secondary | ICD-10-CM

## 2016-10-11 DIAGNOSIS — W000XXA Fall on same level due to ice and snow, initial encounter: Secondary | ICD-10-CM | POA: Diagnosis present

## 2016-10-11 DIAGNOSIS — I6201 Nontraumatic acute subdural hemorrhage: Secondary | ICD-10-CM | POA: Diagnosis not present

## 2016-10-11 DIAGNOSIS — I480 Paroxysmal atrial fibrillation: Secondary | ICD-10-CM | POA: Diagnosis not present

## 2016-10-11 DIAGNOSIS — I481 Persistent atrial fibrillation: Secondary | ICD-10-CM | POA: Diagnosis not present

## 2016-10-11 DIAGNOSIS — S065X9A Traumatic subdural hemorrhage with loss of consciousness of unspecified duration, initial encounter: Secondary | ICD-10-CM | POA: Diagnosis not present

## 2016-10-11 DIAGNOSIS — R29898 Other symptoms and signs involving the musculoskeletal system: Secondary | ICD-10-CM | POA: Diagnosis not present

## 2016-10-11 DIAGNOSIS — G8194 Hemiplegia, unspecified affecting left nondominant side: Secondary | ICD-10-CM | POA: Diagnosis present

## 2016-10-11 DIAGNOSIS — I6203 Nontraumatic chronic subdural hemorrhage: Secondary | ICD-10-CM | POA: Diagnosis not present

## 2016-10-11 DIAGNOSIS — S065X0A Traumatic subdural hemorrhage without loss of consciousness, initial encounter: Secondary | ICD-10-CM | POA: Diagnosis not present

## 2016-10-11 DIAGNOSIS — M5030 Other cervical disc degeneration, unspecified cervical region: Secondary | ICD-10-CM | POA: Diagnosis present

## 2016-10-11 DIAGNOSIS — Z9842 Cataract extraction status, left eye: Secondary | ICD-10-CM

## 2016-10-11 DIAGNOSIS — I110 Hypertensive heart disease with heart failure: Secondary | ICD-10-CM | POA: Diagnosis present

## 2016-10-11 DIAGNOSIS — Z87891 Personal history of nicotine dependence: Secondary | ICD-10-CM

## 2016-10-11 DIAGNOSIS — E875 Hyperkalemia: Secondary | ICD-10-CM | POA: Diagnosis not present

## 2016-10-11 DIAGNOSIS — I509 Heart failure, unspecified: Secondary | ICD-10-CM | POA: Diagnosis present

## 2016-10-11 DIAGNOSIS — R001 Bradycardia, unspecified: Secondary | ICD-10-CM | POA: Diagnosis present

## 2016-10-11 DIAGNOSIS — C9110 Chronic lymphocytic leukemia of B-cell type not having achieved remission: Secondary | ICD-10-CM | POA: Diagnosis present

## 2016-10-11 DIAGNOSIS — M542 Cervicalgia: Secondary | ICD-10-CM

## 2016-10-11 DIAGNOSIS — S065XAA Traumatic subdural hemorrhage with loss of consciousness status unknown, initial encounter: Secondary | ICD-10-CM | POA: Diagnosis present

## 2016-10-11 DIAGNOSIS — I4891 Unspecified atrial fibrillation: Secondary | ICD-10-CM | POA: Diagnosis not present

## 2016-10-11 DIAGNOSIS — M109 Gout, unspecified: Secondary | ICD-10-CM | POA: Diagnosis present

## 2016-10-11 DIAGNOSIS — R51 Headache: Secondary | ICD-10-CM | POA: Diagnosis not present

## 2016-10-11 DIAGNOSIS — Z79899 Other long term (current) drug therapy: Secondary | ICD-10-CM

## 2016-10-11 DIAGNOSIS — I6200 Nontraumatic subdural hemorrhage, unspecified: Secondary | ICD-10-CM | POA: Diagnosis not present

## 2016-10-11 DIAGNOSIS — E876 Hypokalemia: Secondary | ICD-10-CM | POA: Diagnosis present

## 2016-10-11 DIAGNOSIS — E871 Hypo-osmolality and hyponatremia: Secondary | ICD-10-CM | POA: Diagnosis not present

## 2016-10-11 DIAGNOSIS — I4819 Other persistent atrial fibrillation: Secondary | ICD-10-CM

## 2016-10-11 DIAGNOSIS — C9190 Lymphoid leukemia, unspecified not having achieved remission: Secondary | ICD-10-CM | POA: Diagnosis not present

## 2016-10-11 DIAGNOSIS — Z9841 Cataract extraction status, right eye: Secondary | ICD-10-CM

## 2016-10-11 DIAGNOSIS — Z01810 Encounter for preprocedural cardiovascular examination: Secondary | ICD-10-CM | POA: Diagnosis not present

## 2016-10-11 DIAGNOSIS — I482 Chronic atrial fibrillation: Secondary | ICD-10-CM | POA: Diagnosis not present

## 2016-10-11 LAB — BASIC METABOLIC PANEL
ANION GAP: 10 (ref 5–15)
BUN: 18 mg/dL (ref 6–20)
CALCIUM: 9.4 mg/dL (ref 8.9–10.3)
CO2: 25 mmol/L (ref 22–32)
Chloride: 103 mmol/L (ref 101–111)
Creatinine, Ser: 1.15 mg/dL (ref 0.61–1.24)
GFR, EST NON AFRICAN AMERICAN: 60 mL/min — AB (ref >60)
Glucose, Bld: 203 mg/dL — ABNORMAL HIGH (ref 65–99)
Potassium: 3.2 mmol/L — ABNORMAL LOW (ref 3.5–5.1)
SODIUM: 138 mmol/L (ref 135–145)

## 2016-10-11 NOTE — ED Provider Notes (Signed)
[facility] DEPT Provider Note   CSN: [id number] Arrival date & time: 10/11/16  2308   By signing my name below, I, Andrew [name], attest that this documentation has been prepared under the direction and in the presence of Delora Fuel, MD. Electronically Signed: Hilbert [name], Scribe. 10/12/16. 12:02 AM. History   Chief Complaint Chief Complaint  Patient presents with  . Migraine    The history is provided by the patient. No language interpreter was used.  HPI Comments: Andrew [name] is a 76 y.o. male with hx of CLL and A-fib who presents to the Emergency Department complaining of having a migraine for the past 1.5 weeks. He rates his pain 2/10 currently. He describes his pain as "sharp". He states that he was recently diagnosed with a migraine about a week ago and was prescribed medication for his pain. He also had an MRI scheduled for today but was unable to go to the appointment because the power went out. He states that he took a dose of Fioricet around 5 pm tonight with significant relief in his pain. He states that he has never had a migraine in the past. Per wife: The patient has also been having difficulty with coordination since earlier today. His wife states that he was having difficulty buttoning his shirt and buckling his seatbelt today. He denies nausea, vomiting, or difficulty walking recently. No numbness or tingling.  Past Medical History:  Diagnosis Date  . CLL (chronic lymphocytic leukemia) ([place])   . Dysrhythmia    Hx. of Atrial Fibrillation- has resolved  . Hypertension   . Pinched nerve in neck     Patient Active Problem List   Diagnosis Date Noted  . Hypoxemia 05/13/2016  . Preventative health care 03/06/2016  . CLL (chronic lymphocytic leukemia) ([place]) 03/09/2015    Past Surgical History:  Procedure Laterality Date  . CATARACT EXTRACTION, BILATERAL    . COLONOSCOPY  07/10/2011   Procedure: COLONOSCOPY;  Surgeon: Dorothyann Peng, MD;  Location: [facility]  ENDO SUITE;  Service: Endoscopy;  Laterality: N/A;  10:30 AM  . THYROIDECTOMY, PARTIAL    . TONSILLECTOMY         Home Medications    Prior to Admission medications   Medication Sig Start Date End Date Taking? Authorizing Provider  acyclovir (ZOVIRAX) 400 MG tablet Take 1 tablet (400 mg total) by mouth 2 (two) times daily. 10/05/16  Yes Kefalas, Manon Hilding, PA-C  allopurinol (ZYLOPRIM) 300 MG tablet Take 1 tablet (300 mg total) by mouth 2 (two) times daily. 10/05/16  Yes Kefalas, Manon Hilding, PA-C  amLODipine (NORVASC) 10 MG tablet Take 10 mg by mouth daily.   Yes [provider]  aspirin 325 MG tablet Take 325 mg by mouth daily.   Yes [provider]  butalbital-acetaminophen-caffeine (FIORICET, ESGIC) [phone] MG tablet Take 1-2 tablets by mouth every 6 (six) hours as needed for headache. 10/05/16 10/05/17 Yes Holley Bouche, NP  carvedilol (COREG) 12.5 MG tablet Take 12.5 mg by mouth 2 (two) times daily with a meal.   Yes [provider]  Coenzyme Q10 (COQ10) 100 MG CAPS Take 1 tablet by mouth daily.   Yes [provider]  fish oil-omega-3 fatty acids 1000 MG capsule Take 1 g by mouth 2 (two) times daily.   Yes [provider]  IMBRUVICA 420 MG TABS TAKE ONE TABLET BY MOUTH DAILY. 10/04/16  Yes Kefalas, Manon Hilding, PA-C  Multiple Vitamin (MULITIVITAMIN WITH MINERALS) TABS Take 1 tablet by mouth  daily.   Yes [provider]  telmisartan-hydrochlorothiazide (MICARDIS HCT) 80-25 MG tablet Take 1 tablet by mouth daily.   Yes [provider]  ondansetron (ZOFRAN) 8 MG tablet Take 1 tablet (8 mg total) by mouth every 8 (eight) hours as needed for nausea or vomiting. Patient not taking: Reported on 10/05/2016 08/03/16   Twana First, MD  prochlorperazine (COMPAZINE) 10 MG tablet Take 1 tablet (10 mg total) by mouth every 6 (six) hours as needed for nausea or vomiting. Patient not taking: Reported on 09/04/2016 08/03/16   Twana First, MD     Family History Family History  Problem Relation Age of Onset  . Colon cancer Neg Hx     Social History Social History  Substance Use Topics  . Smoking status: Former Smoker    Packs/day: 1.00    Years: 15.00  . Smokeless tobacco: Never Used  . Alcohol use No     Allergies   Patient has no known allergies.   Review of Systems Review of Systems  All other systems reviewed and are negative.  Physical Exam Updated Vital Signs BP (!) 154/86 (BP Location: Right Arm)   Pulse 77   Temp 97.7 F (36.5 C) (Oral)   Resp 18   Ht 6\' 2"  (1.88 m)   Wt 230 lb (104.3 kg)   SpO2 98%   BMI 29.53 kg/m   Physical Exam  Constitutional: He is oriented to person, place, and time. He appears well-developed and well-nourished.  HENT:  Head: Normocephalic and atraumatic.  No tenderness of the temporalis muscle or peri cervical muscle.  Eyes: EOM are normal. Pupils are equal, round, and reactive to light.  Fundi are grossly normal, but limited exam due to constricted pupils.  Neck: Normal range of motion. Neck supple. No JVD present.  Cardiovascular: Normal rate, regular rhythm and normal heart sounds.   No murmur heard. Pulmonary/Chest: Effort normal and breath sounds normal. He has no wheezes. He has no rales. He exhibits no tenderness.  Abdominal: Soft. Bowel sounds are normal. He exhibits no distension and no mass. There is no tenderness.  Musculoskeletal: Normal range of motion. He exhibits no edema.  Lymphadenopathy:    He has no cervical adenopathy.  Neurological: He is alert and oriented to person, place, and time. No cranial nerve deficit. He exhibits normal muscle tone. Coordination normal.  Mild left pronator drift. Moderate past pointing on the left on finger-nose testing.  Skin: Skin is warm and dry. No rash noted.  Psychiatric: He has a normal mood and affect. His behavior is normal. Judgment and thought content normal.  Nursing note and vitals reviewed.  ED  Treatments / Results  DIAGNOSTIC STUDIES: Oxygen Saturation is 98% on RA, normal by my interpretation.    COORDINATION OF CARE: 11:36 PM Discussed treatment plan with pt at bedside and pt agreed to plan. I will check his labs.  Labs (all labs ordered are listed, but only abnormal results are displayed) Labs Reviewed  BASIC METABOLIC PANEL - Abnormal; Notable for the following:       Result Value   Potassium 3.2 (*)    Glucose, Bld 203 (*)    GFR calc non Af Amer 60 (*)    All other components within normal limits  CBC WITH DIFFERENTIAL/PLATELET - Abnormal; Notable for the following:    WBC 141.3 (*)    Neutro Abs 11.3 (*)    Lymphs Abs 130.0 (*)    Monocytes Absolute 0.0 (*)  All other components within normal limits  I-STAT TROPOININ, ED    EKG  EKG Interpretation  Date/Time:  Wednesday Oct 11 2016 23:18:49 EDT Ventricular Rate:  74 PR Interval:    QRS Duration: 123 QT Interval:  404 QTC Calculation: 449 R Axis:   139 Text Interpretation:  Atrial fibrillation Nonspecific intraventricular conduction delay When compared with ECG of 08/10/2016, No significant change was found Confirmed by Delora Fuel ([id number]) on 10/11/2016 11:27:27 PM       Radiology Ct Head Wo Contrast  Result Date: 10/12/2016 CLINICAL DATA:  Headache and incoordination. EXAM: CT HEAD WITHOUT CONTRAST TECHNIQUE: Contiguous axial images were obtained from the base of the skull through the vertex without intravenous contrast. COMPARISON:  None. FINDINGS: Brain: A 1.4 cm thick right-sided subdural hematoma overlying the right parietal lobe with mixed attenuation suggesting a recent bleed is noted with approximately 3 mm of right to left midline shift. No hydrocephalus is identified. The basal cisterns and fourth ventricle are midline. No intraparenchymal hemorrhage is identified. No large vascular territory infarction is noted. Vascular: No hyperdense vessel or unexpected calcification. Skull: Normal. Negative  for fracture or focal lesion. Sinuses/Orbits: No acute finding. Mild ethmoid sinus mucosal thickening. Intact orbits and globes. Mastoids are clear. Other: None IMPRESSION: Right-sided subdural hematoma overlying the parietal convexity measuring up to 1.4 cm in thickness with 3 mm of right to left midline shift. Given mixed attenuation within, findings likely represent a recent event. Critical Value/emergent results were called by telephone at the time of interpretation on 10/12/2016 at 12:08 am to Dr. Delora Fuel , who verbally acknowledged these results. Electronically Signed   By: Ashley Royalty M.D.   On: 10/12/2016 00:09    Procedures Procedures (including critical care time) CRITICAL CARE Performed by: QVZDG,LOVFI Total critical care time: 40 minutes Critical care time was exclusive of separately billable procedures and treating other patients. Critical care was necessary to treat or prevent imminent or life-threatening deterioration. Critical care was time spent personally by me on the following activities: development of treatment plan with patient and/or surrogate as well as nursing, discussions with consultants, evaluation of patient's response to treatment, examination of patient, obtaining history from patient or surrogate, ordering and performing treatments and interventions, ordering and review of laboratory studies, ordering and review of radiographic studies, pulse oximetry and re-evaluation of patient's condition.  Medications Ordered in ED Medications  potassium chloride SA (K-DUR,KLOR-CON) CR tablet 40 mEq (not administered)     Initial Impression / Assessment and Plan / ED Course  I have reviewed the triage vital signs and the nursing notes.  Pertinent labs & imaging results that were available during my care of the patient were reviewed by me and considered in my medical decision making (see chart for details). I have personally discussed the CT findings with the  radiologist.  Headache of recent onset. Doubt migraine since migraine typically has onset much earlier in life. Neurologic findings suggestive of cerebellar lesion. Last known normal was 24 hours ago, which places him outside of the timeframe for code stroke activation. Atrial fibrillation noted which would put him at risk for stroke - had also been present on ECG in March of this year, and he is not on any anticoagulation. Old records are reviewed confirming treatment for chronic lymphocytic leukemia with recent increase in WBCs. He is sent for CT of the head which shows evidence of acute on chronic subdural hematoma involving the right frontal region. This would explain his left arm  drift, but not incoordination. Case is discussed with Dr. Sherwood Gambler, who has reviewed the CT scans and does not feel there is an indication for immediate surgical management. Complicated case because of confluence of chronic leukemia, atrial fibrillation, subdural hematoma. Will need to admit to have appropriate coordination between multiple specialties. Case is discussed with Dr. Marin Comment [facility] who agrees to see the patient. Also, mild hypokalemia is noted and is given a dose of oral potassium.  CHA2DS2/VAS Stroke Risk Points      3 >= 2 Points: High Risk  1 - 1.99 Points: Medium Risk  0 Points: Low Risk    The patient's score has not changed in the past year.:  No Change         Details    Note: External data might be a factor in metrics not marked with    Points Metrics   This score determines the patient's risk of having a stroke if the  patient has atrial fibrillation.       0 Has Congestive Heart Failure:  No   0 Has Vascular Disease:  No   1 Has Hypertension:  Yes   2 Age:  45   0 Has Diabetes:  No   0 Had Stroke:  No Had TIA:  No Had thromboembolism:  No   0 Male:  No          Final Clinical Impressions(s) / ED Diagnoses   Final diagnoses:  Acute on chronic intracranial subdural  hematoma (HCC)  Atrial fibrillation, persistent (HCC)  CLL (chronic lymphocytic leukemia) (HCC)  Hypokalemia    New Prescriptions New Prescriptions   No medications on file   I personally performed the services described in this documentation, which was scribed in my presence. The recorded information has been reviewed and is accurate.      Delora Fuel, MD 85/02/77 [phone]

## 2016-10-11 NOTE — ED Triage Notes (Signed)
Pt's wife also reports that the patient seemed to have some difficulty buttoning his shirt an buckling his seatbelt earlier today, but pt denies weakness in any part of his body.

## 2016-10-11 NOTE — ED Triage Notes (Signed)
Pt was diagnosed with migraine a week ago and was to have an mri today at [facility] but the power was out and he was not able to have it.  Pt states after taking his fiorcet approx 5 pm tonight, his headache has improved.

## 2016-10-12 ENCOUNTER — Encounter (HOSPITAL_COMMUNITY): Payer: Self-pay

## 2016-10-12 ENCOUNTER — Inpatient Hospital Stay (HOSPITAL_COMMUNITY): Payer: Medicare HMO

## 2016-10-12 DIAGNOSIS — C9190 Lymphoid leukemia, unspecified not having achieved remission: Secondary | ICD-10-CM | POA: Diagnosis not present

## 2016-10-12 DIAGNOSIS — I480 Paroxysmal atrial fibrillation: Secondary | ICD-10-CM | POA: Diagnosis not present

## 2016-10-12 DIAGNOSIS — M109 Gout, unspecified: Secondary | ICD-10-CM | POA: Diagnosis not present

## 2016-10-12 DIAGNOSIS — C9110 Chronic lymphocytic leukemia of B-cell type not having achieved remission: Secondary | ICD-10-CM | POA: Diagnosis not present

## 2016-10-12 DIAGNOSIS — I509 Heart failure, unspecified: Secondary | ICD-10-CM | POA: Diagnosis present

## 2016-10-12 DIAGNOSIS — E875 Hyperkalemia: Secondary | ICD-10-CM | POA: Diagnosis not present

## 2016-10-12 DIAGNOSIS — Z9841 Cataract extraction status, right eye: Secondary | ICD-10-CM | POA: Diagnosis not present

## 2016-10-12 DIAGNOSIS — Z9842 Cataract extraction status, left eye: Secondary | ICD-10-CM | POA: Diagnosis not present

## 2016-10-12 DIAGNOSIS — R001 Bradycardia, unspecified: Secondary | ICD-10-CM | POA: Diagnosis not present

## 2016-10-12 DIAGNOSIS — I4891 Unspecified atrial fibrillation: Secondary | ICD-10-CM | POA: Diagnosis not present

## 2016-10-12 DIAGNOSIS — E876 Hypokalemia: Secondary | ICD-10-CM | POA: Diagnosis not present

## 2016-10-12 DIAGNOSIS — I481 Persistent atrial fibrillation: Secondary | ICD-10-CM | POA: Diagnosis not present

## 2016-10-12 DIAGNOSIS — Z87891 Personal history of nicotine dependence: Secondary | ICD-10-CM | POA: Diagnosis not present

## 2016-10-12 DIAGNOSIS — S065X9A Traumatic subdural hemorrhage with loss of consciousness of unspecified duration, initial encounter: Secondary | ICD-10-CM | POA: Diagnosis not present

## 2016-10-12 DIAGNOSIS — M5030 Other cervical disc degeneration, unspecified cervical region: Secondary | ICD-10-CM | POA: Diagnosis present

## 2016-10-12 DIAGNOSIS — I110 Hypertensive heart disease with heart failure: Secondary | ICD-10-CM | POA: Diagnosis not present

## 2016-10-12 DIAGNOSIS — G8190 Hemiplegia, unspecified affecting unspecified side: Secondary | ICD-10-CM | POA: Diagnosis not present

## 2016-10-12 DIAGNOSIS — I6200 Nontraumatic subdural hemorrhage, unspecified: Secondary | ICD-10-CM

## 2016-10-12 DIAGNOSIS — S065X0A Traumatic subdural hemorrhage without loss of consciousness, initial encounter: Secondary | ICD-10-CM | POA: Diagnosis not present

## 2016-10-12 DIAGNOSIS — Z01810 Encounter for preprocedural cardiovascular examination: Secondary | ICD-10-CM | POA: Diagnosis not present

## 2016-10-12 DIAGNOSIS — C91Z0 Other lymphoid leukemia not having achieved remission: Secondary | ICD-10-CM | POA: Diagnosis not present

## 2016-10-12 DIAGNOSIS — I6202 Nontraumatic subacute subdural hemorrhage: Secondary | ICD-10-CM | POA: Diagnosis not present

## 2016-10-12 DIAGNOSIS — I10 Essential (primary) hypertension: Secondary | ICD-10-CM | POA: Diagnosis not present

## 2016-10-12 DIAGNOSIS — W000XXA Fall on same level due to ice and snow, initial encounter: Secondary | ICD-10-CM | POA: Diagnosis not present

## 2016-10-12 DIAGNOSIS — Z7982 Long term (current) use of aspirin: Secondary | ICD-10-CM | POA: Diagnosis not present

## 2016-10-12 DIAGNOSIS — R29898 Other symptoms and signs involving the musculoskeletal system: Secondary | ICD-10-CM | POA: Diagnosis not present

## 2016-10-12 DIAGNOSIS — M4802 Spinal stenosis, cervical region: Secondary | ICD-10-CM | POA: Diagnosis not present

## 2016-10-12 DIAGNOSIS — Z79899 Other long term (current) drug therapy: Secondary | ICD-10-CM | POA: Diagnosis not present

## 2016-10-12 DIAGNOSIS — I6203 Nontraumatic chronic subdural hemorrhage: Secondary | ICD-10-CM | POA: Diagnosis not present

## 2016-10-12 DIAGNOSIS — I482 Chronic atrial fibrillation: Secondary | ICD-10-CM | POA: Diagnosis not present

## 2016-10-12 DIAGNOSIS — S065XAA Traumatic subdural hemorrhage with loss of consciousness status unknown, initial encounter: Secondary | ICD-10-CM | POA: Diagnosis present

## 2016-10-12 DIAGNOSIS — R51 Headache: Secondary | ICD-10-CM | POA: Diagnosis not present

## 2016-10-12 DIAGNOSIS — E871 Hypo-osmolality and hyponatremia: Secondary | ICD-10-CM | POA: Diagnosis not present

## 2016-10-12 DIAGNOSIS — G8194 Hemiplegia, unspecified affecting left nondominant side: Secondary | ICD-10-CM | POA: Diagnosis not present

## 2016-10-12 LAB — CBC WITH DIFFERENTIAL/PLATELET
BLASTS: 0 %
Band Neutrophils: 0 %
Basophils Absolute: 0 10*3/uL (ref 0.0–0.1)
Basophils Relative: 0 %
EOS ABS: 0 10*3/uL (ref 0.0–0.7)
Eosinophils Relative: 0 %
HEMATOCRIT: 43 % (ref 39.0–52.0)
HEMOGLOBIN: 14.2 g/dL (ref 13.0–17.0)
Lymphocytes Relative: 92 %
Lymphs Abs: 130 10*3/uL — ABNORMAL HIGH (ref 0.7–4.0)
MCH: 31.3 pg (ref 26.0–34.0)
MCHC: 33 g/dL (ref 30.0–36.0)
MCV: 94.7 fL (ref 78.0–100.0)
MONO ABS: 0 10*3/uL — AB (ref 0.1–1.0)
MYELOCYTES: 0 %
Metamyelocytes Relative: 0 %
Monocytes Relative: 0 %
NRBC: 0 /100{WBCs}
Neutro Abs: 11.3 10*3/uL — ABNORMAL HIGH (ref 1.7–7.7)
Neutrophils Relative %: 8 %
Other: 0 %
PROMYELOCYTES ABS: 0 %
Platelets: 181 10*3/uL (ref 150–400)
RBC: 4.54 MIL/uL (ref 4.22–5.81)
RDW: 12.6 % (ref 11.5–15.5)
WBC: 141.3 10*3/uL (ref 4.0–10.5)

## 2016-10-12 LAB — I-STAT TROPONIN, ED: Troponin i, poc: 0 ng/mL (ref 0.00–0.08)

## 2016-10-12 LAB — MRSA PCR SCREENING: MRSA BY PCR: POSITIVE — AB

## 2016-10-12 LAB — TSH: TSH: 3.247 u[IU]/mL (ref 0.350–4.500)

## 2016-10-12 MED ORDER — CARVEDILOL 12.5 MG PO TABS
12.5000 mg | ORAL_TABLET | Freq: Two times a day (BID) | ORAL | Status: DC
  Administered 2016-10-12 – 2016-10-17 (×10): 12.5 mg via ORAL
  Filled 2016-10-12 (×10): qty 1

## 2016-10-12 MED ORDER — ADULT MULTIVITAMIN W/MINERALS CH
1.0000 | ORAL_TABLET | Freq: Every day | ORAL | Status: DC
  Administered 2016-10-12 – 2016-10-18 (×6): 1 via ORAL
  Filled 2016-10-12 (×9): qty 1

## 2016-10-12 MED ORDER — IRBESARTAN 150 MG PO TABS
300.0000 mg | ORAL_TABLET | Freq: Every day | ORAL | Status: DC
  Administered 2016-10-12 – 2016-10-13 (×2): 300 mg via ORAL
  Filled 2016-10-12 (×3): qty 1

## 2016-10-12 MED ORDER — POTASSIUM CHLORIDE CRYS ER 20 MEQ PO TBCR
40.0000 meq | EXTENDED_RELEASE_TABLET | Freq: Once | ORAL | Status: AC
  Administered 2016-10-12: 40 meq via ORAL
  Filled 2016-10-12: qty 2

## 2016-10-12 MED ORDER — HYDROCHLOROTHIAZIDE 25 MG PO TABS
25.0000 mg | ORAL_TABLET | Freq: Every day | ORAL | Status: DC
  Administered 2016-10-12 – 2016-10-13 (×2): 25 mg via ORAL
  Filled 2016-10-12 (×2): qty 1

## 2016-10-12 MED ORDER — TELMISARTAN-HCTZ 80-25 MG PO TABS: 1.0000 | ORAL_TABLET | Freq: Every day | ORAL | Status: DC

## 2016-10-12 MED ORDER — MUPIROCIN 2 % EX OINT
1.0000 "application " | TOPICAL_OINTMENT | Freq: Two times a day (BID) | CUTANEOUS | Status: AC
  Administered 2016-10-12 – 2016-10-16 (×9): 1 via NASAL
  Filled 2016-10-12: qty 22

## 2016-10-12 MED ORDER — AMLODIPINE BESYLATE 10 MG PO TABS
10.0000 mg | ORAL_TABLET | Freq: Every day | ORAL | Status: DC
Start: 2016-10-12 — End: 2016-10-18
  Administered 2016-10-12 – 2016-10-18 (×6): 10 mg via ORAL
  Filled 2016-10-12 (×6): qty 1

## 2016-10-12 MED ORDER — CHLORHEXIDINE GLUCONATE CLOTH 2 % EX PADS
6.0000 | MEDICATED_PAD | Freq: Every day | CUTANEOUS | Status: AC
  Administered 2016-10-12 – 2016-10-16 (×4): 6 via TOPICAL

## 2016-10-12 MED ORDER — ALLOPURINOL 100 MG PO TABS
300.0000 mg | ORAL_TABLET | Freq: Two times a day (BID) | ORAL | Status: DC
  Administered 2016-10-12 – 2016-10-18 (×12): 300 mg via ORAL
  Filled 2016-10-12 (×4): qty 1
  Filled 2016-10-12 (×2): qty 3
  Filled 2016-10-12 (×6): qty 1

## 2016-10-12 MED ORDER — OXYCODONE-ACETAMINOPHEN 5-325 MG PO TABS
1.0000 | ORAL_TABLET | ORAL | Status: DC | PRN
  Administered 2016-10-12 (×4): 2 via ORAL
  Administered 2016-10-12: 1 via ORAL
  Administered 2016-10-13 – 2016-10-14 (×5): 2 via ORAL
  Administered 2016-10-15 (×3): 1 via ORAL
  Administered 2016-10-16: 2 via ORAL
  Administered 2016-10-16: 1 via ORAL
  Filled 2016-10-12 (×2): qty 1
  Filled 2016-10-12 (×2): qty 2
  Filled 2016-10-12 (×2): qty 1
  Filled 2016-10-12 (×3): qty 2
  Filled 2016-10-12 (×3): qty 1
  Filled 2016-10-12: qty 2
  Filled 2016-10-12: qty 1
  Filled 2016-10-12: qty 2
  Filled 2016-10-12: qty 1
  Filled 2016-10-12: qty 2

## 2016-10-12 MED ORDER — IBRUTINIB 420 MG PO TABS
420.0000 mg | ORAL_TABLET | Freq: Every day | ORAL | Status: DC
  Administered 2016-10-12: 420 mg via ORAL
  Filled 2016-10-12: qty 1

## 2016-10-12 MED ORDER — ACETAMINOPHEN 325 MG PO TABS: 650.0000 mg | ORAL_TABLET | Freq: Four times a day (QID) | ORAL | Status: DC | PRN

## 2016-10-12 MED ORDER — MUPIROCIN 2 % EX OINT
TOPICAL_OINTMENT | CUTANEOUS | Status: AC
  Administered 2016-10-12: 1
  Filled 2016-10-12: qty 22

## 2016-10-12 MED ORDER — COQ10 100 MG PO CAPS: 1.0000 | ORAL_CAPSULE | Freq: Every day | ORAL | Status: DC

## 2016-10-12 MED ORDER — OMEGA-3-ACID ETHYL ESTERS 1 G PO CAPS: 1.0000 g | ORAL_CAPSULE | Freq: Two times a day (BID) | ORAL | Status: DC

## 2016-10-12 MED ORDER — ACYCLOVIR 400 MG PO TABS
400.0000 mg | ORAL_TABLET | Freq: Two times a day (BID) | ORAL | Status: DC
  Administered 2016-10-12 – 2016-10-18 (×12): 400 mg via ORAL
  Filled 2016-10-12 (×13): qty 1

## 2016-10-12 MED ORDER — SODIUM CHLORIDE 0.9% FLUSH
3.0000 mL | Freq: Two times a day (BID) | INTRAVENOUS | Status: DC
  Administered 2016-10-12 – 2016-10-13 (×4): 3 mL via INTRAVENOUS
  Administered 2016-10-14: 10 mL via INTRAVENOUS
  Administered 2016-10-15 – 2016-10-17 (×5): 3 mL via INTRAVENOUS

## 2016-10-12 MED ORDER — ACETAMINOPHEN 650 MG RE SUPP: 650.0000 mg | Freq: Four times a day (QID) | RECTAL | Status: DC | PRN

## 2016-10-12 NOTE — Care Management Note (Addendum)
Case Management Note  Patient Details  Name: Andrew [name] MRN: [id number] Date of Birth: 1940/12/31  Subjective/Objective:  From home, presents with SDH for surgery on Friday per Neurosurgery.     PCP listed - Allyn Kenner:                Action/Plan: NCM will follow for dc needs.   Expected Discharge Date:                  Expected Discharge Plan:     In-House Referral:     Discharge planning Services  CM Consult  Post Acute Care Choice:    Choice offered to:     DME Arranged:    DME Agency:     HH Arranged:    HH Agency:     Status of Service:  In process, will continue to follow  If discussed at Long Length of Stay Meetings, dates discussed:    Additional Comments:  Zenon Mayo, RN 10/12/2016, 4:05 PM

## 2016-10-12 NOTE — ED Notes (Signed)
Report given to [facility] RN with [facility] and report given to [employer] on 4E

## 2016-10-12 NOTE — Progress Notes (Signed)
PROGRESS NOTE                                                                                                                                                                                                             Patient Demographics:    Andrew [name], is a 76 y.o. male, DOB - January 20, 1941, VZC:[id number]  Admit date - 10/11/2016   Admitting Physician Etta Quill, DO  Outpatient Primary MD for the patient is [name], MD  LOS - 0  Outpatient Specialists: Oncology in [facility] Complaint  Patient presents with  . Migraine       Brief Narrative  76 year old male with history of A. fib on full dose aspirin, CLL with WBC in 140s (onseeing Dr Talbert Cage and is on Imbruvica for Binet Stage C CLL), fall at home about 4-5 months back and hit his head without loss of consciousness presented to [facility] ED with migraine like frontal headache for past one and half week. His oncologist had ordered an MRI of the brain to be done yesterday. For the past 2-3 days he also noticed his hands and feet becoming somewhat weak and had some unsteadiness while walking. He had trouble buttoning his shirt. Reports symptoms primarily affecting some skilled motor function rather than his strength. Denies any tingling or numbness, recent fall or loss of consciousness. In the ED CT of the head showed right frontoparietal subdural hematoma measuring up to 14 mm in thickness with 3 mm midline shift to the left. Neurosurgery consulted and patient transferred to [facility] for further management.   Subjective:    Patient denies any weakness. His headache has resolved. Denies any numbness but still has some difficulty with skilled motor function in his left hand.   Assessment  & Plan :    Principal Problem:   Acute/subacute Subdural hematoma (HCC) Associated mild midline shift. Continue step down monitoring with frequent neuro checks.  Continue telemetry. MRI brain again shows subdural hematoma with small midline shift. No signs of stroke. -Neurosurgery consult appreciated. Given his neurological deficit recommends surgical evacuation of the subdural hematoma in the next 24-48 hours.  Recommends patient cannot be on any form of anticoagulant or antiplatelet agents for the next 1-2 months. -Aspirin discontinued. Given his other cardiac history including A. fib and remote CHF with consult cardiology  for preoperative clearance. I have continued his beta blocker.    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 Consult his oncologist to clarify if this can be continued perioperatively.    A-fib (HCC) In sinus rhythm. Aspirin has been discontinued. Continue Coreg.  Essential hypertension On multiple medications including Coreg, amlodipine, Avapro and HCTZ which have been continued.  Hypokalemia Replenished.   History of gout Continue allopurinol       Code Status : Full code  Family Communication  : Wife at bedside  Disposition Plan  : Pending hospital course  Barriers For Discharge :  Active symptoms  Consults  :  Cardiology neurosurgery  Procedures  :  CT head MRI brain   DVT Prophylaxis  :  SCDs  Lab Results  Component Value Date   PLT 181 10/11/2016    Antibiotics  :   Anti-infectives    Start     Dose/Rate Route Frequency Ordered Stop   10/12/16 1000  acyclovir (ZOVIRAX) tablet 400 mg     400 mg Oral 2 times daily 10/12/16 0350          Objective:   Vitals:   10/12/16 0200 10/12/16 0347 10/12/16 0754 10/12/16 0832  BP: (!) 152/89 (!) 152/96 (!) 155/94 (!) 155/94  Pulse: 68 73 73 78  Resp: (!) 21 (!) 21 16   Temp:  97.4 F (36.3 C) 98.7 F (37.1 C)   TempSrc:  Oral Oral   SpO2: 92% 96%    Weight:      Height:        Wt Readings from Last 3 Encounters:  10/11/16 104.3 kg (230 lb)  10/05/16 103.4 kg (228 lb)    09/04/16 102.2 kg (225 lb 6.4 oz)     Intake/Output Summary (Last 24 hours) at 10/12/16 1119 Last data filed at 10/12/16 1000  Gross per 24 hour  Intake              240 ml  Output                0 ml  Net              240 ml     Physical Exam  Gen: not in distress HEENT: no pallor, moist mucosa, supple neck Chest: clear b/l, no added sounds CVS: N S1&S2, no murmurs, rubs or gallop GI: soft, NT, ND, Musculoskeletal: warm, no edema CNS: AAOX3,No Focal defect. Normal sensations bilaterally.    Data Review:    CBC  Recent Labs Lab 10/11/16 2326  WBC 141.3*  HGB 14.2  HCT 43.0  PLT 181  MCV 94.7  MCH 31.3  MCHC 33.0  RDW 12.6  LYMPHSABS 130.0*  MONOABS 0.0*  EOSABS 0.0  BASOSABS 0.0    Chemistries   Recent Labs Lab 10/11/16 2326  NA 138  K 3.2*  CL 103  CO2 25  GLUCOSE 203*  BUN 18  CREATININE 1.15  CALCIUM 9.4   ------------------------------------------------------------------------------------------------------------------ No results for input(s): CHOL, HDL, LDLCALC, TRIG, CHOLHDL, LDLDIRECT in the last 72 hours.  No results found for: HGBA1C ------------------------------------------------------------------------------------------------------------------  Recent Labs  10/12/16 0708  TSH 3.247   ------------------------------------------------------------------------------------------------------------------ No results for input(s): VITAMINB12, FOLATE, FERRITIN, TIBC, IRON, RETICCTPCT in the last 72 hours.  Coagulation profile No results for input(s): INR, PROTIME in the last 168 hours.  No results for input(s): DDIMER in the last 72 hours.  Cardiac Enzymes No results for  input(s): CKMB, TROPONINI, MYOGLOBIN in the last 168 hours.  Invalid input(s): CK ------------------------------------------------------------------------------------------------------------------ No results found for: BNP  Inpatient Medications  Scheduled  Meds: . acyclovir  400 mg Oral BID  . allopurinol  300 mg Oral BID  . amLODipine  10 mg Oral Daily  . carvedilol  12.5 mg Oral BID WC  . Chlorhexidine Gluconate Cloth  6 each Topical Q0600  . irbesartan  300 mg Oral Daily   And  . hydrochlorothiazide  25 mg Oral Daily  . Ibrutinib  420 mg Oral Daily  . multivitamin with minerals  1 tablet Oral Daily  . mupirocin ointment  1 application Nasal BID  . sodium chloride flush  3 mL Intravenous Q12H   Continuous Infusions: PRN Meds:.acetaminophen **OR** acetaminophen, oxyCODONE-acetaminophen  Micro Results Recent Results (from the past 240 hour(s))  MRSA PCR Screening     Status: Abnormal   Collection Time: 10/12/16  3:47 AM  Result Value Ref Range Status   MRSA by PCR POSITIVE (A) NEGATIVE Final    Comment:        The GeneXpert MRSA Assay (FDA approved for NASAL specimens only), is one component of a comprehensive MRSA colonization surveillance program. It is not intended to diagnose MRSA infection nor to guide or monitor treatment for MRSA infections. RESULT CALLED TO, READ BACK BY AND VERIFIED WITH: A. Netta Corrigan RN 10:05 10/12/16 ([email or web address])     Radiology Reports Ct Head Wo Contrast  Result Date: 10/12/2016 CLINICAL DATA:  Headache and incoordination. EXAM: CT HEAD WITHOUT CONTRAST TECHNIQUE: Contiguous axial images were obtained from the base of the skull through the vertex without intravenous contrast. COMPARISON:  None. FINDINGS: Brain: A 1.4 cm thick right-sided subdural hematoma overlying the right parietal lobe with mixed attenuation suggesting a recent bleed is noted with approximately 3 mm of right to left midline shift. No hydrocephalus is identified. The basal cisterns and fourth ventricle are midline. No intraparenchymal hemorrhage is identified. No large vascular territory infarction is noted. Vascular: No hyperdense vessel or unexpected calcification. Skull: Normal. Negative for fracture or focal lesion. Sinuses/Orbits:  No acute finding. Mild ethmoid sinus mucosal thickening. Intact orbits and globes. Mastoids are clear. Other: None IMPRESSION: Right-sided subdural hematoma overlying the parietal convexity measuring up to 1.4 cm in thickness with 3 mm of right to left midline shift. Given mixed attenuation within, findings likely represent a recent event. Critical Value/emergent results were called by telephone at the time of interpretation on 10/12/2016 at 12:08 am to Dr. Delora Fuel , who verbally acknowledged these results. Electronically Signed   By: Ashley Royalty M.D.   On: 10/12/2016 00:09   Mr Brain Wo Contrast  Result Date: 10/12/2016 CLINICAL DATA:  Subdural hematoma. Right arm weakness. History of CLL. EXAM: MRI HEAD WITHOUT CONTRAST TECHNIQUE: Multiplanar, multiecho pulse sequences of the brain and surrounding structures were obtained without intravenous contrast. COMPARISON:  CT head 10/11/2016 FINDINGS: Brain: Right-sided subdural hematoma has mixed signal intensity with serum and layering and blood products. This appears to be subacute. Subdural blood in the right parietal lobe measuring up to 14 mm in thickness unchanged from the prior CT. Small amount of subdural blood is present in the middle cranial fossa lateral and inferior to the temporal lobe. Methemoglobin is present within the subdural hematoma compatible with subacute blood. Small amount of subarachnoid hemorrhage is seen with hyperintense signal in the sulci on FLAIR imaging. Ventricle size normal. Mild midline shift to the left of 3 mm is unchanged. Negative for  acute infarct. No significant chronic ischemic change. No mass lesion. Vascular: Normal arterial flow voids. Skull and upper cervical spine: Negative Sinuses/Orbits: Mild mucosal edema in the paranasal sinuses. Bilateral cataract removal. Other: None IMPRESSION: Subacute right-sided subdural hematoma measuring up to 14 mm in thickness in the right parietal lobe with small amount of blood in the  right middle cranial fossa. 3 mm midline shift to the left is unchanged. Small amount of subarachnoid hemorrhage Negative for acute infarct. Electronically Signed   By: Franchot Gallo M.D.   On: 10/12/2016 07:53    Time Spent in minutes  25   Louellen Molder M.D on 10/12/2016 at 11:19 AM  Between 7am to 7pm - Pager - [phone]  After 7pm go to [email or web address] - password [email or web address]  [facility] -  Office  [phone]

## 2016-10-12 NOTE — ED Notes (Signed)
Dr. Marin Comment in with pt at this time

## 2016-10-12 NOTE — ED Notes (Signed)
Date and time results received: 10/12/16 0008 (use smartphrase ".now" to insert current time)  Test: wbc Critical Value: 141.26  Name of Provider Notified: Roxanne Mins  Orders Received? Or Actions Taken?:

## 2016-10-12 NOTE — Consult Note (Signed)
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 Delora Fuel and Dr. Flonnie Overman Dhungel  Andrew [name] is an 76 y.o. male.  HPI: Patient admitted to the [facility]ad hospitalist service at [facility] after presenting to the [facility] yesterday with complaints of clumsiness in his hands and feet. Patient has a notable history of chronic lymphocytic leukemia followed and treated at the [place] [facility], currently treated with ibrutinib.  Patient explains that he been in his usual state of health until about a week and a half ago when he developed persistent right frontal headache. For the past 2 or 3 days he's noticed clumsiness with his hands and feet. He explains that he has had trouble buttoning buttons of the shirt and has had some mild unsteadiness when walking. Because these difficulties he presented to the emergency room for evaluation.  Patient was evaluated by Dr. Delora Fuel (EDP) with CT of the brain, that revealed a right frontoparietal mixed (primarily chronic) subdural hematoma. He was also found to have atrial fibrillation with a normal heart rate, (patient does have a remote history of atrial fibrillation that was treated with cardioversion about 15 years ago).    I was consulted by phone, and recommended admission to the hospitalist service at [facility], with neurosurgical consultation, cardiology consultation, and medical oncology consultation. Since admission patient is undergone MRI of the brain which again showed the right frontoparietal mixed density (primarily chronic) subdural hematoma, but no MRI evidence of acute stroke.  Patient's been given Percocet which is relieved his headache.  Past Medical History:  Past Medical History:  Diagnosis Date  . CLL (chronic lymphocytic leukemia) ([place])   . Dysrhythmia    Hx. of Atrial Fibrillation- has  resolved  . Hypertension   . Pinched nerve in neck     Past Surgical History:  Past Surgical History:  Procedure Laterality Date  . CATARACT EXTRACTION, BILATERAL    . COLONOSCOPY  07/10/2011   Procedure: COLONOSCOPY;  Surgeon: Dorothyann Peng, MD;  [facility];  Service: Endoscopy;  Laterality: N/A;  10:30 AM  . THYROIDECTOMY, PARTIAL    . TONSILLECTOMY      Family History:  Family History  Problem Relation Age of Onset  . Colon cancer Neg Hx     Social History:  reports that he has quit smoking. He has a 15.00 pack-year smoking history. He has never used smokeless tobacco. He reports that he does not drink alcohol or use drugs.  Allergies: No Known Allergies  Medications: I have reviewed the patient's current medications.  ROS:  Notable for those described in his history of present illness and past medical history, but is otherwise unremarkable.  Physical Examination: Well-developed well-nourished white male in no acute distress. Blood pressure (!) 152/96, pulse 73, temperature 97.4 F (36.3 C), temperature source Oral, resp. rate (!) 21, height '6\' 2"'  (1.88 m), weight 104.3 kg (230 lb), SpO2 96 %. Lungs:  Clear to auscultation, symmetrical respiratory excursion. Heart:  Irregular irregular rhythm. No murmur. Abdomen:  Soft, nondistended, bowel sounds present. Extremity:  No clubbing, cyanosis, or edema.  Neurological Examination: Mental Status Examination:  Awake alert, oriented fully. Speech fluent. Good comprehension. Following commands well. Cranial Nerve Examination:  Pupils equal, round, react to light. EOMI. Facial sensation intact. Mild flattening of left nasolabial fold. Hearing present bilaterally. Palatal movements symmetrical. Shoulder shrug symmetrical. Tongue midline. Motor Examination:  Mild to moderate left pronator drift. Mild left hemiparesis, with left deltoid 4+, left biceps triceps and grip 5, left intrinsics 4+, left iliopsoas 4-4+, left  quadriceps 5, left dorsiflexor 4+ to 5, left plantar flexor 5. Right upper and lower extremity strength is 5/5. Sensory Examination:  Intact to touch in the upper and lower extremities. Reflex Examination:   Symmetrical. Gait and Stance Examination:  Minimal unsteadiness.   Results for orders placed or performed during the hospital encounter of 10/11/16 (from the past 48 hour(s))  Basic metabolic panel     Status: Abnormal   Collection Time: 10/11/16 11:26 PM  Result Value Ref Range   Sodium 138 135 - 145 mmol/L   Potassium 3.2 (L) 3.5 - 5.1 mmol/L   Chloride 103 101 - 111 mmol/L   CO2 25 22 - 32 mmol/L   Glucose, Bld 203 (H) 65 - 99 mg/dL   BUN 18 6 - 20 mg/dL   Creatinine, Ser 1.15 0.61 - 1.24 mg/dL   Calcium 9.4 8.9 - 10.3 mg/dL   GFR calc non Af Amer 60 (L) >60 mL/min   GFR calc Af Amer >60 >60 mL/min    Comment: (NOTE) The eGFR has been calculated using the CKD EPI equation. This calculation has not been validated in all clinical situations. eGFR's persistently <60 mL/min signify possible Chronic Kidney Disease.    Anion gap 10 5 - 15  CBC with Differential     Status: Abnormal   Collection Time: 10/11/16 11:26 PM  Result Value Ref Range   WBC 141.3 (HH) 4.0 - 10.5 K/uL    Comment: RESULT REPEATED AND VERIFIED CRITICAL RESULT CALLED TO, READ BACK BY AND VERIFIED WITH:  NORMAN,B @ 0008 ON 5/17 BY JUW    RBC 4.54 4.22 - 5.81 MIL/uL   Hemoglobin 14.2 13.0 - 17.0 g/dL   HCT 43.0 39.0 - 52.0 %   MCV 94.7 78.0 - 100.0 fL   MCH 31.3 26.0 - 34.0 pg   MCHC 33.0 30.0 - 36.0 g/dL   RDW 12.6 11.5 - 15.5 %   Platelets 181 150 - 400 K/uL   Neutrophils Relative % 8 %   Lymphocytes Relative 92 %   Monocytes Relative 0 %   Eosinophils Relative 0 %   Basophils Relative 0 %   Band Neutrophils 0 %   Metamyelocytes Relative 0 %   Myelocytes 0 %   Promyelocytes Absolute 0 %   Blasts 0 %   nRBC 0 0 /100 WBC   Other 0 %   Neutro Abs 11.3 (H) 1.7 - 7.7 K/uL   Lymphs Abs 130.0 (H)  0.7 - 4.0 K/uL   Monocytes Absolute 0.0 (L) 0.1 - 1.0 K/uL   Eosinophils Absolute 0.0 0.0 - 0.7 K/uL   Basophils Absolute 0.0 0.0 - 0.1 K/uL  I-stat troponin, ED     Status: None   Collection Time: 10/11/16 11:54 PM  Result Value Ref Range   Troponin i, poc 0.00 0.00 - 0.08 ng/mL   Comment 3            Comment: Due to the release kinetics of cTnI, a negative result within the first hours of the onset of symptoms does not rule out myocardial infarction with certainty. If myocardial infarction is still suspected, repeat the test at appropriate intervals.   TSH     Status: None   Collection Time: 10/12/16  7:08 AM  Result Value Ref Range   TSH 3.247 0.350 - 4.500  uIU/mL    Comment: Performed by a 3rd Generation assay with a functional sensitivity of <=0.01 uIU/mL.    Ct Head Wo Contrast  Result Date: 10/12/2016 CLINICAL DATA:  Headache and incoordination. EXAM: CT HEAD WITHOUT CONTRAST TECHNIQUE: Contiguous axial images were obtained from the base of the skull through the vertex without intravenous contrast. COMPARISON:  None. FINDINGS: Brain: A 1.4 cm thick right-sided subdural hematoma overlying the right parietal lobe with mixed attenuation suggesting a recent bleed is noted with approximately 3 mm of right to left midline shift. No hydrocephalus is identified. The basal cisterns and fourth ventricle are midline. No intraparenchymal hemorrhage is identified. No large vascular territory infarction is noted. Vascular: No hyperdense vessel or unexpected calcification. Skull: Normal. Negative for fracture or focal lesion. Sinuses/Orbits: No acute finding. Mild ethmoid sinus mucosal thickening. Intact orbits and globes. Mastoids are clear. Other: None IMPRESSION: Right-sided subdural hematoma overlying the parietal convexity measuring up to 1.4 cm in thickness with 3 mm of right to left midline shift. Given mixed attenuation within, findings likely represent a recent event. Critical  Value/emergent results were called by telephone at the time of interpretation on 10/12/2016 at 12:08 am to Dr. Delora Fuel , who verbally acknowledged these results. Electronically Signed   By: Ashley Royalty M.D.   On: 10/12/2016 00:09   Mr Brain Wo Contrast  Result Date: 10/12/2016 CLINICAL DATA:  Subdural hematoma. Right arm weakness. History of CLL. EXAM: MRI HEAD WITHOUT CONTRAST TECHNIQUE: Multiplanar, multiecho pulse sequences of the brain and surrounding structures were obtained without intravenous contrast. COMPARISON:  CT head 10/11/2016 FINDINGS: Brain: Right-sided subdural hematoma has mixed signal intensity with serum and layering and blood products. This appears to be subacute. Subdural blood in the right parietal lobe measuring up to 14 mm in thickness unchanged from the prior CT. Small amount of subdural blood is present in the middle cranial fossa lateral and inferior to the temporal lobe. Methemoglobin is present within the subdural hematoma compatible with subacute blood. Small amount of subarachnoid hemorrhage is seen with hyperintense signal in the sulci on FLAIR imaging. Ventricle size normal. Mild midline shift to the left of 3 mm is unchanged. Negative for acute infarct. No significant chronic ischemic change. No mass lesion. Vascular: Normal arterial flow voids. Skull and upper cervical spine: Negative Sinuses/Orbits: Mild mucosal edema in the paranasal sinuses. Bilateral cataract removal. Other: None IMPRESSION: Subacute right-sided subdural hematoma measuring up to 14 mm in thickness in the right parietal lobe with small amount of blood in the right middle cranial fossa. 3 mm midline shift to the left is unchanged. Small amount of subarachnoid hemorrhage Negative for acute infarct. Electronically Signed   By: Franchot Gallo M.D.   On: 10/12/2016 07:53     Assessment/Plan: Patient presenting with complaints of clumsiness, and is found to have a mild left hemiparesis secondary to a  moderate sized, primarily chronic, right frontoparietal subdural hematoma. History is notable for CLL which is under treatment. Also has been found to have atrial fibrillation, for which he was treated 15 years ago, he has a normal heart rate although it is irregular.  The patient did have a fall sometime in the winter in which his feet went out in front of him, and he struck the back of his head. Looking back at his records, we find that his blood counts from the cancer center showed a moderate thrombocytopenia documented in March of this year, but now has a normal platelet count. Both the  fall and his thrombocytopenia may well have contributed to the development of this subdural hematoma.  Exam shows a mild left hemiparesis.  I've discussed his case with the patient himself and his wife who was at his bedside, as well as with Dr. Clementeen Graham, from the [facility]. For at least the next month or 2 he will not be a candidate for treatment with antiplatelet agents or anticoagulants. I anticipate because of the symptomatic deficit present that we will most likely want to evacuate the subdural hematoma. I've explained to the patient his wife that this will require a right frontoparietal craniotomy. I discussed the nature the procedure including its risks of infection, bleeding, possible reaccumulation of the subdural hematoma and the need for further surgery, and anesthetic risks of myocardial infarction, stroke, pneumonia, and death. I will return tomorrow morning to reassess the patient and provide further recommendations. In the meantime the patient is scheduled for a echocardiogram. Dr. Clementeen Graham will consult with cardiology, and will also speak with the [place].  If not already ordered, I will request updated laboratories for the morning.  Hosie Spangle, MD 10/12/2016, 8:23 AM

## 2016-10-12 NOTE — H&P (Signed)
History and Physical    Andrew [name]:[id number] DOB: 02/15/1941 DOA: 10/11/2016  PCP: [name], MD  Patient coming from: Home.    Chief Complaint:  Headache.   HPI: Andrew [name] is an 76 y.o. male with hx of fall several months ago, not any recent fall, hx of afib on ASA, CHADS 3, hx of CLL with WBC in the 140K, seeing Dr Talbert Cage and is on Imbruvica for Binet Stage C CLL, presented to the ER with HA and right arm weakness.  He was having problem with buttoning his shirt.  Evaluation in the ER included an EKG showing afib with controlled rate, normal hemodynamic, and head CT showed a subdural hematoma on the parietal area of 1.4 cm with 45mm shift.  The density suggest that it was a recent event.  WBC of 140K normal renal Fx tests.  Dr Roxanne Mins of the EP spoke with Dr Larose Hires of neurosurgery, and he recommended transferring to [facility] for observation.    ED Course:  See above.  Rewiew of Systems:  Constitutional: Negative for malaise, fever and chills. No significant weight loss or weight gain Eyes: Negative for eye pain, redness and discharge, diplopia, visual changes, or flashes of light. ENMT: Negative for ear pain, hoarseness, nasal congestion, sinus pressure and sore throat. No headaches; tinnitus, drooling, or problem swallowing. Cardiovascular: Negative for chest pain, palpitations, diaphoresis, dyspnea and peripheral edema. ; No orthopnea, PND Respiratory: Negative for cough, hemoptysis, wheezing and stridor. No pleuritic chestpain. Gastrointestinal: Negative for diarrhea, constipation,  melena, blood in stool, hematemesis, jaundice and rectal bleeding.    Genitourinary: Negative for frequency, dysuria, incontinence,flank pain and hematuria; Musculoskeletal: Negative for back pain and neck pain. Negative for swelling and trauma.;  Skin: . Negative for pruritus, rash, abrasions, bruising and skin lesion.; ulcerations Neuro: Negative for lightheadedness and neck stiffness. Negative  for weakness, altered level of consciousness , altered mental status, extremity weakness, burning feet, involuntary movement, seizure and syncope.  Psych: negative for anxiety, depression, insomnia, tearfulness, panic attacks, hallucinations, paranoia, suicidal or homicidal ideation    Past Medical History:  Diagnosis Date  . CLL (chronic lymphocytic leukemia) ([place])   . Dysrhythmia    Hx. of Atrial Fibrillation- has resolved  . Hypertension   . Pinched nerve in neck     Past Surgical History:  Procedure Laterality Date  . CATARACT EXTRACTION, BILATERAL    . COLONOSCOPY  07/10/2011   Procedure: COLONOSCOPY;  Surgeon: Dorothyann Peng, MD;  [facility];  Service: Endoscopy;  Laterality: N/A;  10:30 AM  . THYROIDECTOMY, PARTIAL    . TONSILLECTOMY       reports that he has quit smoking. He has a 15.00 pack-year smoking history. He has never used smokeless tobacco. He reports that he does not drink alcohol or use drugs.  No Known Allergies  Family History  Problem Relation Age of Onset  . Colon cancer Neg Hx      Prior to Admission medications   Medication Sig Start Date End Date Taking? Authorizing Provider  acyclovir (ZOVIRAX) 400 MG tablet Take 1 tablet (400 mg total) by mouth 2 (two) times daily. 10/05/16  Yes Kefalas, Manon Hilding, PA-C  allopurinol (ZYLOPRIM) 300 MG tablet Take 1 tablet (300 mg total) by mouth 2 (two) times daily. 10/05/16  Yes Kefalas, Manon Hilding, PA-C  amLODipine (NORVASC) 10 MG tablet Take 10 mg by mouth daily.   Yes [provider]  aspirin 325 MG tablet Take 325  mg by mouth daily.   Yes [provider]  butalbital-acetaminophen-caffeine (FIORICET, ESGIC) [phone] MG tablet Take 1-2 tablets by mouth every 6 (six) hours as needed for headache. 10/05/16 10/05/17 Yes Holley Bouche, NP  carvedilol (COREG) 12.5 MG tablet Take 12.5 mg by mouth 2 (two) times daily with a meal.   Yes [provider]  Coenzyme Q10 (COQ10) 100 MG  CAPS Take 1 tablet by mouth daily.   Yes [provider]  fish oil-omega-3 fatty acids 1000 MG capsule Take 1 g by mouth 2 (two) times daily.   Yes [provider]  IMBRUVICA 420 MG TABS TAKE ONE TABLET BY MOUTH DAILY. 10/04/16  Yes Kefalas, Manon Hilding, PA-C  Multiple Vitamin (MULITIVITAMIN WITH MINERALS) TABS Take 1 tablet by mouth daily.   Yes [provider]  telmisartan-hydrochlorothiazide (MICARDIS HCT) 80-25 MG tablet Take 1 tablet by mouth daily.   Yes [provider]  ondansetron (ZOFRAN) 8 MG tablet Take 1 tablet (8 mg total) by mouth every 8 (eight) hours as needed for nausea or vomiting. Patient not taking: Reported on 10/05/2016 08/03/16   Twana First, MD  prochlorperazine (COMPAZINE) 10 MG tablet Take 1 tablet (10 mg total) by mouth every 6 (six) hours as needed for nausea or vomiting. Patient not taking: Reported on 09/04/2016 08/03/16   Twana First, MD    Physical Exam: Vitals:   10/11/16 2320 10/11/16 2330 10/12/16 0030 10/12/16 0100  BP:  135/80 (!) 142/87 (!) 143/95  Pulse:  78 79 82  Resp:  (!) 21 (!) 24 (!) 22  Temp:      TempSrc:      SpO2:  95% 93% 95%  Weight: 104.3 kg (230 lb)     Height: 6\' 2"  (1.88 m)         Constitutional: NAD, calm, comfortable Vitals:   10/11/16 2320 10/11/16 2330 10/12/16 0030 10/12/16 0100  BP:  135/80 (!) 142/87 (!) 143/95  Pulse:  78 79 82  Resp:  (!) 21 (!) 24 (!) 22  Temp:      TempSrc:      SpO2:  95% 93% 95%  Weight: 104.3 kg (230 lb)     Height: 6\' 2"  (1.88 m)      Eyes: PERRL, lids and conjunctivae normal ENMT: Mucous membranes are moist. Posterior pharynx clear of any exudate or lesions.Normal dentition.  Neck: normal, supple, no masses, no thyromegaly Respiratory: clear to auscultation bilaterally, no wheezing, no crackles. Normal respiratory effort. No accessory muscle use.  Cardiovascular: Regular rate and rhythm, no murmurs / rubs / gallops. No extremity edema. 2+ pedal pulses. No  carotid bruits.  Abdomen: no tenderness, no masses palpated. No hepatosplenomegaly. Bowel sounds positive.  Musculoskeletal: no clubbing / cyanosis. No joint deformity upper and lower extremities. Good ROM, no contractures. Normal muscle tone.  Skin: no rashes, lesions, ulcers. No induration Neurologic: CN 2-12 grossly intact. Sensation intact, DTR normal. Strength 5/5 in all 4. He has slight dysmetria on his left arm.  Slightly weaker on the left hand grasp.  Good facial symmetry and fluent speech.  He is alert and conversed normally.  Psychiatric: Normal judgment and insight. Alert and oriented x 3. Normal mood.    Labs on Admission: I have personally reviewed following labs and imaging studies CBC:  Recent Labs Lab 10/05/16 0821 10/11/16 2326  WBC 140.9* 141.3*  NEUTROABS 8.5* 11.3*  HGB 14.5 14.2  HCT 44.4 43.0  MCV 95.9 94.7  PLT 136* 181  Basic Metabolic Panel:  Recent Labs Lab 10/05/16 0821 10/11/16 2326  NA 139 138  K 3.6 3.2*  CL 103 103  CO2 29 25  GLUCOSE 167* 203*  BUN 21* 18  CREATININE 1.26* 1.15  CALCIUM 9.4 9.4  PHOS 3.6  --    GFR: Estimated Creatinine Clearance: 70.3 mL/min (by C-G formula based on SCr of 1.15 mg/dL). Liver Function Tests:  Recent Labs Lab 10/05/16 0821  AST 23  ALT 16*  ALKPHOS 47  BILITOT 1.4*  PROT 6.3*  ALBUMIN 4.0   Urine analysis:    Component Value Date/Time   COLORURINE YELLOW 05/13/2016 [place] 05/13/2016 1212   LABSPEC 1.025 05/13/2016 1212   PHURINE 5.0 05/13/2016 1212   GLUCOSEU NEGATIVE 05/13/2016 1212   [place] (A) 05/13/2016 1212   BILIRUBINUR NEGATIVE 05/13/2016 1212   KETONESUR NEGATIVE 05/13/2016 1212   PROTEINUR NEGATIVE 05/13/2016 1212   NITRITE NEGATIVE 05/13/2016 1212   LEUKOCYTESUR NEGATIVE 05/13/2016 1212   Radiological Exams on Admission: Ct Head Wo Contrast  Result Date: 10/12/2016 CLINICAL DATA:  Headache and incoordination. EXAM: CT HEAD WITHOUT CONTRAST  TECHNIQUE: Contiguous axial images were obtained from the base of the skull through the vertex without intravenous contrast. COMPARISON:  None. FINDINGS: Brain: A 1.4 cm thick right-sided subdural hematoma overlying the right parietal lobe with mixed attenuation suggesting a recent bleed is noted with approximately 3 mm of right to left midline shift. No hydrocephalus is identified. The basal cisterns and fourth ventricle are midline. No intraparenchymal hemorrhage is identified. No large vascular territory infarction is noted. Vascular: No hyperdense vessel or unexpected calcification. Skull: Normal. Negative for fracture or focal lesion. Sinuses/Orbits: No acute finding. Mild ethmoid sinus mucosal thickening. Intact orbits and globes. Mastoids are clear. Other: None IMPRESSION: Right-sided subdural hematoma overlying the parietal convexity measuring up to 1.4 cm in thickness with 3 mm of right to left midline shift. Given mixed attenuation within, findings likely represent a recent event. Critical Value/emergent results were called by telephone at the time of interpretation on 10/12/2016 at 12:08 am to Dr. Delora Fuel , who verbally acknowledged these results. Electronically Signed   By: Ashley Royalty M.D.   On: 10/12/2016 00:09   EKG: Independently reviewed.   Assessment/Plan Principal Problem:   Subdural hematoma (HCC) Active Problems:   CLL (chronic lymphocytic leukemia) (HCC)   A-fib (HCC)   PLAN:   Subdural hematoma:  Per neurosurgery, will admit to [facility] for following his subdural hematoma.  Dr Loel Lofty did not think he will require surgery. His neurological deficit may be due to a CVA, and will obtain a brain MRI as well.    Afib, CHADS 3:  He is not a candidate for anticoagulation, and may be a candidate for a Watchman's device.  Should defer to cardiology.  I will stop his ASA at this time.  His rate is controlled.   Obtain ECHO.  CLL: Will hold his chemotherapy for now.    DVT prophylaxis:  SCD>  Code Status: FULL CODE>  Family Communication: wife at bedside.  Disposition Plan: likely to home when stable.  Consults called: Neurosurgery.  Admission status: OBS.    Shelsey Rieth MD FACP. [facility]  If 7PM-7AM, please contact night-coverage [email or web address] Password [email or web address]  10/12/2016, 1:51 AM

## 2016-10-12 NOTE — Consult Note (Signed)
Patient ID: Andrew [name] MRN: [id number], DOB/AGE: 77-May-1942   Admit date: 10/11/2016  Reason for Consult: Pre-op Evaluation/Surgical Clearance  Requesting Physician: Dr. Rita Ohara, Neurosurgery    Primary Physician: [name], MD Primary Cardiologist: New (Dr. Sallyanne Kuster)    Pt. Profile:  Andrew [name] is a 76 y.o. 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 Given his symptomatic deficits, plan is for neurosurgery to evacuate the subdural hematoma. Given his history of atrial fibrillation, which was present at time of admit, Cardiology has been consulted for pre-operative evaluation/ surgical clearance, at the request of Dr. Rita Ohara, neurosurgeon.   Problem List  Past Medical History:  Diagnosis Date  . Atrial fibrillation ([place])   . CLL (chronic lymphocytic leukemia) ([place])   . Dysrhythmia    Hx. of Atrial Fibrillation- has resolved  . Hypertension   . Pinched nerve in neck     Past Surgical History:  Procedure Laterality Date  . CATARACT EXTRACTION, BILATERAL    . COLONOSCOPY  07/10/2011   Procedure: COLONOSCOPY;  Surgeon: Dorothyann Peng, MD;  Location: [facility];  Service: Endoscopy;  Laterality: N/A;  10:30 AM  . THYROIDECTOMY, PARTIAL    . TONSILLECTOMY       Allergies  No Known Allergies  HPI  Andrew [name] is a 76 y.o. 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 Given his symptomatic deficits, plan is for neurosurgery to evacuate the subdural hematoma.  Given his history of atrial fibrillation, which was present at time of admit, Cardiology has been consulted for pre-operative evaluation/ surgical clearance, at the request of Dr. Rita Ohara, neurosurgeon.   The exact onset of his subdural hematoma is unknown. However the patient recalls falling last winter and struck the back of his head. Review of laboratory work from the cancer center shows that he had thrombocytopenia documented in March. However his platelet count has since normalized. If is felt that perhaps his fall in the setting of thrombocytopenia may well have contributed to the development of this subdural hematoma. Most recently, he has had development of clumsiness in his hands and feet, resulting in unsteady gait and difficulty with ADLs.  It has been noted by neurosurgery that he will not be a candidate for antiplatelets nor anticoagulants for a least the next month or 2.   The patient was also noted to be in atrial fibrillation with a CVR when he arrived to the ED. Ventricular rate was in the 70s. EKG from 07/2016 also shows controled atrial fibrillation. It is noted in chart that he had a cardioversion ~15 years ago. This was done while he was living in [place].  He appears to be on BB therapy with Coreg but no other agents. No anticoagulants prior to admission. He denies any prior h/o stroke/TIA. Reports prior h/o CHF when he was diagnosed with his afib years ago. He has no known h/o CAD. No previous echocardiograms on file. 2D echo has been ordred and pending. His BP appears to be mildly elevated. He is mildly hypokalemic  with K at 3.2. TSH is WNL at 3.247.  Other history>> former smoker, quit 45 years ago. + FH of heart disease. Father died from MI at age 80.   He denies any symptoms of ischemia. No exertional CP or dyspnea. He is able to perform at least 4 METS of physical activity, (walk up a flight of stairs and moderate intensity yard work), without exertional symptoms. He denies  syncope/near syncope, orthopnea, PND and LEE.   Home Medications  Prior to Admission medications   Medication Sig Start Date End Date Taking? Authorizing Provider  acyclovir (ZOVIRAX) 400 MG tablet Take 1 tablet (400 mg total) by mouth 2 (two) times daily. 10/05/16  Yes Kefalas, Manon Hilding, PA-C  allopurinol (ZYLOPRIM) 300 MG tablet Take 1 tablet (300 mg total) by mouth 2 (two) times daily. 10/05/16  Yes Kefalas, Manon Hilding, PA-C  amLODipine (NORVASC) 10 MG tablet Take 10 mg by mouth daily.   Yes [provider]  aspirin 325 MG tablet Take 325 mg by mouth daily.   Yes [provider]  butalbital-acetaminophen-caffeine (FIORICET, ESGIC) [phone] MG tablet Take 1-2 tablets by mouth every 6 (six) hours as needed for headache. 10/05/16 10/05/17 Yes Holley Bouche, NP  carvedilol (COREG) 12.5 MG tablet Take 12.5 mg by mouth 2 (two) times daily with a meal.   Yes [provider]  Coenzyme Q10 (COQ10) 100 MG CAPS Take 1 tablet by mouth daily.   Yes [provider]  fish oil-omega-3 fatty acids 1000 MG capsule Take 1 g by mouth 2 (two) times daily.   Yes [provider]  IMBRUVICA 420 MG TABS TAKE ONE TABLET BY MOUTH DAILY. 10/04/16  Yes Kefalas, Manon Hilding, PA-C  Multiple Vitamin (MULITIVITAMIN WITH MINERALS) TABS Take 1 tablet by mouth daily.   Yes [provider]  telmisartan-hydrochlorothiazide (MICARDIS HCT) 80-25 MG tablet Take 1 tablet by mouth daily.   Yes [provider]  ondansetron (ZOFRAN) 8 MG tablet Take 1 tablet (8 mg total) by mouth every 8 (eight) hours as needed for nausea or vomiting. Patient not taking: Reported on 10/05/2016 08/03/16   Twana First, MD  prochlorperazine (COMPAZINE) 10 MG tablet Take 1 tablet (10 mg total) by mouth every 6 (six) hours as needed for nausea or vomiting. Patient not taking: Reported on 09/04/2016 08/03/16   Twana First, MD    Hospital Medications  . acyclovir  400 mg Oral BID  . allopurinol  300 mg  Oral BID  . amLODipine  10 mg Oral Daily  . carvedilol  12.5 mg Oral BID WC  . Chlorhexidine Gluconate Cloth  6 each Topical Q0600  . irbesartan  300 mg Oral Daily   And  . hydrochlorothiazide  25 mg Oral Daily  . Ibrutinib  420 mg Oral Daily  . multivitamin with minerals  1 tablet Oral Daily  . mupirocin ointment  1 application Nasal BID  . sodium chloride flush  3 mL Intravenous Q12H    acetaminophen **OR** acetaminophen, oxyCODONE-acetaminophen  Family History  Family History  Problem Relation Age of Onset  . CAD Father 68  . Colon cancer Neg Hx     Social History  Social History   Social History  . Marital status: Married    Spouse name: N/A  . Number of children: N/A  . Years of education: N/A   Occupational History  . Not on file.   Social History Main Topics  . Smoking status: Former Smoker    Packs/day:  1.00    Years: 15.00  . Smokeless tobacco: Never Used  . Alcohol use No  . Drug use: No  . Sexual activity: Not on file   Other Topics Concern  . Not on file   Social History Narrative  . No narrative on file     Review of Systems General:  No chills, fever, night sweats or weight changes.  Cardiovascular:  No chest pain, dyspnea on exertion, edema, orthopnea, palpitations, paroxysmal nocturnal dyspnea. Dermatological: No rash, lesions/masses Respiratory: No cough, dyspnea Urologic: No hematuria, dysuria Abdominal:   No nausea, vomiting, diarrhea, bright red blood per rectum, melena, or hematemesis Neurologic:  No visual changes, wkns, changes in mental status. All other systems reviewed and are otherwise negative except as noted above.  Physical Exam  Blood pressure (!) 141/96, pulse 63, temperature 97.6 F (36.4 C), temperature source Oral, resp. rate 17, height 6\' 2"  (1.88 m), weight 230 lb (104.3 kg), SpO2 98 %.  General: Pleasant, NAD Psych: Normal affect. Neuro: Alert and oriented X 3. Moves all extremities spontaneously. Slight  weakness on the left compared to right.  HEENT: Normal  Neck: Supple without bruits or JVD. Lungs:  Resp regular and unlabored, CTA. Heart: irregularly irregular rhythm with regular rate,  no s3, s4, or murmurs. Abdomen: Soft, non-tender, non-distended, BS + x 4.  Extremities: No clubbing, cyanosis or edema. DP/PT/Radials 2+ and equal bilaterally.  Labs  Troponin [facility] of Care Test)  Recent Labs  10/11/16 2354  TROPIPOC 0.00   No results for input(s): CKTOTAL, CKMB, TROPONINI in the last 72 hours. Lab Results  Component Value Date   WBC 141.3 (HH) 10/11/2016   HGB 14.2 10/11/2016   HCT 43.0 10/11/2016   MCV 94.7 10/11/2016   PLT 181 10/11/2016     Recent Labs Lab 10/11/16 2326  NA 138  K 3.2*  CL 103  CO2 25  BUN 18  CREATININE 1.15  CALCIUM 9.4  GLUCOSE 203*   No results found for: CHOL, HDL, LDLCALC, TRIG No results found for: DDIMER   Radiology/Studies  Ct Head Wo Contrast  Result Date: 10/12/2016 CLINICAL DATA:  Headache and incoordination. EXAM: CT HEAD WITHOUT CONTRAST TECHNIQUE: Contiguous axial images were obtained from the base of the skull through the vertex without intravenous contrast. COMPARISON:  None. FINDINGS: Brain: A 1.4 cm thick right-sided subdural hematoma overlying the right parietal lobe with mixed attenuation suggesting a recent bleed is noted with approximately 3 mm of right to left midline shift. No hydrocephalus is identified. The basal cisterns and fourth ventricle are midline. No intraparenchymal hemorrhage is identified. No large vascular territory infarction is noted. Vascular: No hyperdense vessel or unexpected calcification. Skull: Normal. Negative for fracture or focal lesion. Sinuses/Orbits: No acute finding. Mild ethmoid sinus mucosal thickening. Intact orbits and globes. Mastoids are clear. Other: None IMPRESSION: Right-sided subdural hematoma overlying the parietal convexity measuring up to 1.4 cm in thickness with 3 mm of right to  left midline shift. Given mixed attenuation within, findings likely represent a recent event. Critical Value/emergent results were called by telephone at the time of interpretation on 10/12/2016 at 12:08 am to Dr. Delora Fuel , who verbally acknowledged these results. Electronically Signed   By: Ashley Royalty M.D.   On: 10/12/2016 00:09   Mr Brain Wo Contrast  Result Date: 10/12/2016 CLINICAL DATA:  Subdural hematoma. Right arm weakness. History of CLL. EXAM: MRI HEAD WITHOUT CONTRAST TECHNIQUE: Multiplanar, multiecho pulse sequences of the brain and surrounding structures were  obtained without intravenous contrast. COMPARISON:  CT head 10/11/2016 FINDINGS: Brain: Right-sided subdural hematoma has mixed signal intensity with serum and layering and blood products. This appears to be subacute. Subdural blood in the right parietal lobe measuring up to 14 mm in thickness unchanged from the prior CT. Small amount of subdural blood is present in the middle cranial fossa lateral and inferior to the temporal lobe. Methemoglobin is present within the subdural hematoma compatible with subacute blood. Small amount of subarachnoid hemorrhage is seen with hyperintense signal in the sulci on FLAIR imaging. Ventricle size normal. Mild midline shift to the left of 3 mm is unchanged. Negative for acute infarct. No significant chronic ischemic change. No mass lesion. Vascular: Normal arterial flow voids. Skull and upper cervical spine: Negative Sinuses/Orbits: Mild mucosal edema in the paranasal sinuses. Bilateral cataract removal. Other: None IMPRESSION: Subacute right-sided subdural hematoma measuring up to 14 mm in thickness in the right parietal lobe with small amount of blood in the right middle cranial fossa. 3 mm midline shift to the left is unchanged. Small amount of subarachnoid hemorrhage Negative for acute infarct. Electronically Signed   By: Franchot Gallo M.D.   On: 10/12/2016 07:53    ECG  Atrial fibrillation with  a CVR 74 bpm  -- personally reviewed  Telemetry  afib w/ CVR in the 70s -- personally reviewed   Echocardiogram Pending   ASSESSMENT AND PLAN  Principal Problem:   Subdural hematoma ([facility]) Active Problems:   CLL (chronic lymphocytic leukemia) ([facility])   A-fib ([facility])   1. Atrial Fibrillation: noted on EKG at time of admit. Review of previous EKGs show that he was in afib 07/2016 as well. There is reported h/o cardioversion more than 15 years ago, while living in [place]. His rate is controlled with low dose Coreg, in the 70s. TSH is WNL. Mildly hypokalemic at 3.2 and needs supplementation. 2D echo is pending. He has no known h/o vascular disease nor DM. Based on h/o HTN and age >50, his CHA2DS2 VASc score is at least 3, however given his  subdural hematoma, he is not a candidate for anticoagulation at this time. This can be reassesed further down the road, once he recovers from his neurological condition and once cleared by neurology. For this reason, he is not a candidate for neither electrical nor chemical cardioversion at this time. For now, continue rate control therapy only with Coreg. Supplement K. Monitor Vrates closely during the post-operative period.   2. 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 Exact unset unknown. However there is history of a mechanical fall last winter (slipped on ice) with patient striking his head on the ground, in the setting of thrombocytopenia, which likely resulted in his hematoma. He is symptomatic with left sided hemiparesis, unsteady gait and progressive difficulties performing certain ADLs with his hands. Brain MRI negative for acute stroke. Plan is for neurosurgery to evacuate the subdural hematoma 10/14/16. Not a candidate for antiplatelets nor anticoagulants for at least 1-2 months, per neuro.   3. CLL: undergoing treatment at the [facility]  4. HTN: mildly elevated  but stable. On Coreg, amlodipine, telmisartan and HCTZ at home. Continue to monitor.   3. Pre-operative Cardiac Assessment: 76 y/o male with no known h/o coronary disease. Risk factors for ischemic HD include HTN and family h/o CAD (father died from MI at age 34). He is a former smoker but quit 45 years ago. EKG shows AFib  w/ CVR, which appears to be chronic. He is asymptomatic with this. He denies any symptoms of ischemia. No exertional CP or dyspnea. He is able to perform at least 4 METS of physical activity, (walk up a flight of stairs and moderate intensity yard work), without exertional symptoms. He denies syncope/near syncope, orthopnea, PND and LEE. Other than an irregularly irregular rhythm, his CV exam is benign w/o cardiac murmurs and carotid bruits. 2D echo is pending to assess LVEF, wall motion and valvular function. If normal, he can be cleared for surgery. Will need to monitor his ventricular rates closely postoperatively. Continue BB during the perioperative period.   MD to follow with further recommendations.   Signed, Lyda Jester, PA-C, M[facility] 10/12/2016, 12:34 PM [facility] Pager: [phone]  I have seen and examined the patient along with Lyda Jester, PA-C, M[facility].  I have reviewed the chart, notes and new data.  I agree with PA's note.  Key new complaints: no symptoms of CHF, unaware of irregular rhythm, does not have frequent falls or bleeding problems Key examination changes: irregular rhythm, otherwise normal CV exam. No signs of hypervolemia. Key new findings / data: echo pending. ECG normal except for the arrhythmia.  PLAN: Low risk for CV complications with planned surgery. Continue uninterrupted beta blocker. Meets criteria for long term anticoagulation for embolic stroke prevention, but this will have to wait for a couple of months. Probably OK to start [place] at that time. Another option is referral for Watchman device, especially if his oncologist is concerned  he may be at risk for future bleeding. No indication for cardioversion or antiarrhythmics. His atrial fibrillation is rate controlled and asymptomatic.    Sanda Klein, MD, [place] [phone] 10/12/2016, 1:06 PM

## 2016-10-12 NOTE — ED Notes (Signed)
Pt assisted to bathroom and back to bed 

## 2016-10-12 NOTE — Progress Notes (Signed)

## 2016-10-12 NOTE — ED Notes (Signed)
[facility] here to transport pt 

## 2016-10-13 ENCOUNTER — Ambulatory Visit (HOSPITAL_COMMUNITY): Admission: RE | Admit: 2016-10-13 | Payer: Medicare HMO | Source: Ambulatory Visit

## 2016-10-13 DIAGNOSIS — I480 Paroxysmal atrial fibrillation: Secondary | ICD-10-CM

## 2016-10-13 LAB — CBC WITH DIFFERENTIAL/PLATELET
Basophils Absolute: 0 10*3/uL (ref 0.0–0.1)
Basophils Relative: 0 %
Eosinophils Absolute: 0 10*3/uL (ref 0.0–0.7)
Eosinophils Relative: 0 %
HCT: 43.8 % (ref 39.0–52.0)
Hemoglobin: 14.2 g/dL (ref 13.0–17.0)
Lymphocytes Relative: 93 %
Lymphs Abs: 132.5 10*3/uL — ABNORMAL HIGH (ref 0.7–4.0)
MCH: 30.5 pg (ref 26.0–34.0)
MCHC: 32.4 g/dL (ref 30.0–36.0)
MCV: 94 fL (ref 78.0–100.0)
Monocytes Absolute: 1.4 10*3/uL — ABNORMAL HIGH (ref 0.1–1.0)
Monocytes Relative: 1 %
Neutro Abs: 8.5 10*3/uL — ABNORMAL HIGH (ref 1.7–7.7)
Neutrophils Relative %: 6 %
Platelets: 179 10*3/uL (ref 150–400)
RBC: 4.66 MIL/uL (ref 4.22–5.81)
RDW: 12.5 % (ref 11.5–15.5)
WBC: 142.4 10*3/uL (ref 4.0–10.5)

## 2016-10-13 LAB — BASIC METABOLIC PANEL
Anion gap: 6 (ref 5–15)
BUN: 15 mg/dL (ref 6–20)
CO2: 28 mmol/L (ref 22–32)
Calcium: 9.5 mg/dL (ref 8.9–10.3)
Chloride: 97 mmol/L — ABNORMAL LOW (ref 101–111)
Creatinine, Ser: 1.08 mg/dL (ref 0.61–1.24)
GFR calc Af Amer: >60 mL/min (ref >60)
GFR calc non Af Amer: >60 mL/min (ref >60)
Glucose, Bld: 135 mg/dL — ABNORMAL HIGH (ref 65–99)
Potassium: 4.5 mmol/L (ref 3.5–5.1)
Sodium: 131 mmol/L — ABNORMAL LOW (ref 135–145)

## 2016-10-13 LAB — TYPE AND SCREEN
ABO/RH(D): A POS
Antibody Screen: NEGATIVE

## 2016-10-13 LAB — ABO/RH: ABO/RH(D): A POS

## 2016-10-13 MED ORDER — POLYETHYLENE GLYCOL 3350 17 G PO PACK
17.0000 g | PACK | Freq: Every day | ORAL | Status: DC
  Administered 2016-10-13 – 2016-10-18 (×4): 17 g via ORAL
  Filled 2016-10-13 (×5): qty 1

## 2016-10-13 MED ORDER — CHLORHEXIDINE GLUCONATE 4 % EX LIQD
60.0000 mL | Freq: Once | CUTANEOUS | Status: AC
  Administered 2016-10-13: 4 via TOPICAL
  Filled 2016-10-13: qty 60

## 2016-10-13 MED ORDER — DEXTROSE 5 % IV SOLN
2.0000 g | INTRAVENOUS | Status: AC
  Filled 2016-10-13 (×2): qty 2

## 2016-10-13 MED ORDER — CHLORHEXIDINE GLUCONATE 4 % EX LIQD
60.0000 mL | Freq: Once | CUTANEOUS | Status: AC
  Administered 2016-10-14: 4 via TOPICAL
  Filled 2016-10-13 (×2): qty 60

## 2016-10-13 MED ORDER — HYDROCODONE-ACETAMINOPHEN 5-325 MG PO TABS
1.0000 | ORAL_TABLET | ORAL | Status: DC | PRN
  Administered 2016-10-13 (×2): 2 via ORAL
  Administered 2016-10-14: 1 via ORAL
  Administered 2016-10-14: 2 via ORAL
  Administered 2016-10-15 (×2): 1 via ORAL
  Administered 2016-10-16: 2 via ORAL
  Administered 2016-10-16: 1 via ORAL
  Administered 2016-10-16 – 2016-10-18 (×6): 2 via ORAL
  Filled 2016-10-13: qty 2
  Filled 2016-10-13: qty 1
  Filled 2016-10-13 (×5): qty 2
  Filled 2016-10-13: qty 1
  Filled 2016-10-13 (×3): qty 2
  Filled 2016-10-13 (×2): qty 1
  Filled 2016-10-13: qty 2

## 2016-10-13 NOTE — Progress Notes (Signed)
PROGRESS NOTE                                                                                                                                                                                                             Patient Demographics:    Andrew [name], is a 76 y.o. male, DOB - 07/07/1940, MWN:[id number]  Admit date - 10/11/2016   Admitting Physician Etta Quill, DO  Outpatient Primary MD for the patient is [name], MD  LOS - 1  Outpatient Specialists: Oncology in [facility] Complaint  Patient presents with  . Migraine       Brief Narrative  76 year old male with history of A. fib on full dose aspirin, CLL with WBC in 140s (onseeing Dr Talbert Cage and is on Imbruvica for Binet Stage C CLL), fall at home about 4-5 months back and hit his head without loss of consciousness presented to [facility] ED with migraine like frontal headache for past one and half week. His oncologist had ordered an MRI of the brain to be done yesterday. For the past 2-3 days he also noticed his hands and feet becoming somewhat weak and had some unsteadiness while walking. He had trouble buttoning his shirt. Reports symptoms primarily affecting some skilled motor function rather than his strength. Denies any tingling or numbness, recent fall or loss of consciousness. In the ED CT of the head showed right frontoparietal subdural hematoma measuring up to 14 mm in thickness with 3 mm midline shift to the left. Neurosurgery consulted and patient transferred to [facility] for further management.   Subjective:   Still has some difficulty with fine motor activities in left hand.   Assessment  & Plan :    Principal Problem:   Acute/subacute Subdural hematoma (HCC) Associated mild midline shift. Continue step down monitoring. Stable on telemetry. His CT head and MRI brain shows subdural hematoma with small midline shift. No signs of  stroke. -Neurosurgery consult appreciated. Given his neurological deficit recommends surgical evacuation of the subdural hematoma. Scheduled for tomorrow morning. Cannot use anticoagulant or antiplatelet agents for the next 1-2 months. -Aspirin discontinued. -I will also discontinue his irutinib since it is associated with  Bleeding including gastrointestinal and intracerebral hemorrhage. (Almost up to 30% incidence of hemorrhage). -Appreciate cardiology consult for preoperative clearance. Continue perioperative beta blocker. Recommend  long-term anticoagulation and possibly after 2 months. Another option is placement of a watchman device.    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 called his oncologist office and left a message but have not heard back. I'm discontinuing the irutinib given its high risk of causing hemorrhage including postprocedure bleeding.  Atrial fibrillation (HCC) In sinus rhythm. Aspirin has been discontinued. Continue Coreg.  Essential hypertension On multiple medications including Coreg, amlodipine, Avapro and HCTZ which have been continued.  Hypokalemia Replenished.   History of gout Continue allopurinol   headache Possibly secondary to subdural hematoma and adverse effect of irutinib.  Result.      Code Status : Full code  Family Communication  : Wife at bedside  Disposition Plan  : Pending hospital course  Barriers For Discharge :  Active symptoms  Consults  :  Cardiology neurosurgery  Procedures  :  CT head MRI brain   DVT Prophylaxis  :  SCDs  Lab Results  Component Value Date   PLT 179 10/13/2016    Antibiotics  :   Anti-infectives    Start     Dose/Rate Route Frequency Ordered Stop   10/13/16 0815  cefTRIAXone (ROCEPHIN) 2 g in dextrose 5 % 50 mL IVPB    Comments:  Send with patient to OR on 5/19 a.m. Do not administer on floor, to be given by anesthesia  preoperatively in OR.   2 g 100 mL/hr over 30 Minutes Intravenous To Surgery 10/13/16 0808 10/14/16 0815   10/12/16 1000  acyclovir (ZOVIRAX) tablet 400 mg     400 mg Oral 2 times daily 10/12/16 0350          Objective:   Vitals:   10/12/16 1947 10/12/16 2302 10/13/16 0236 10/13/16 0759  BP: (!) 141/80 120/83 (!) 158/82 (!) 137/91  Pulse: 68 69 62 75  Resp: 16 14 15 18   Temp: 97.9 F (36.6 C) 98.2 F (36.8 C) 98.3 F (36.8 C) 98 F (36.7 C)  TempSrc: Oral Oral Oral Oral  SpO2: 100% 98% 93% 93%  Weight:      Height:        Wt Readings from Last 3 Encounters:  10/11/16 104.3 kg (230 lb)  10/05/16 103.4 kg (228 lb)  09/04/16 102.2 kg (225 lb 6.4 oz)     Intake/Output Summary (Last 24 hours) at 10/13/16 0825 Last data filed at 10/13/16 0245  Gross per 24 hour  Intake             1320 ml  Output             1100 ml  Net              220 ml     Physical Exam General: Not in distress HEENT: Moist mucosa, supple neck Chest: Clear to auscultation bilaterally CVS: Normal S1 and S2, no murmurs GI: Soft, nondistended, nontender Musculoskeletal: No edema CNS: Alert, no focal deficit     Data Review:    CBC  Recent Labs Lab 10/11/16 2326 10/13/16 0436  WBC 141.3* 142.4*  HGB 14.2 14.2  HCT 43.0 43.8  PLT 181 179  MCV 94.7 94.0  MCH 31.3 30.5  MCHC 33.0 32.4  RDW 12.6 12.5  LYMPHSABS 130.0* 132.5*  MONOABS 0.0* 1.4*  EOSABS 0.0 0.0  BASOSABS 0.0 0.0    Chemistries   Recent Labs Lab 10/11/16 2326 10/13/16 0436  NA 138 131*  K 3.2*  4.5  CL 103 97*  CO2 25 28  GLUCOSE 203* 135*  BUN 18 15  CREATININE 1.15 1.08  CALCIUM 9.4 9.5   ------------------------------------------------------------------------------------------------------------------ No results for input(s): CHOL, HDL, LDLCALC, TRIG, CHOLHDL, LDLDIRECT in the last 72 hours.  No results found for:  HGBA1C ------------------------------------------------------------------------------------------------------------------  Recent Labs  10/12/16 0708  TSH 3.247   ------------------------------------------------------------------------------------------------------------------ No results for input(s): VITAMINB12, FOLATE, FERRITIN, TIBC, IRON, RETICCTPCT in the last 72 hours.  Coagulation profile No results for input(s): INR, PROTIME in the last 168 hours.  No results for input(s): DDIMER in the last 72 hours.  Cardiac Enzymes No results for input(s): CKMB, TROPONINI, MYOGLOBIN in the last 168 hours.  Invalid input(s): CK ------------------------------------------------------------------------------------------------------------------ No results found for: BNP  Inpatient Medications  Scheduled Meds: . acyclovir  400 mg Oral BID  . allopurinol  300 mg Oral BID  . amLODipine  10 mg Oral Daily  . carvedilol  12.5 mg Oral BID WC  . Chlorhexidine Gluconate Cloth  6 each Topical Q0600  . irbesartan  300 mg Oral Daily   And  . hydrochlorothiazide  25 mg Oral Daily  . multivitamin with minerals  1 tablet Oral Daily  . mupirocin ointment  1 application Nasal BID  . sodium chloride flush  3 mL Intravenous Q12H   Continuous Infusions: . cefTRIAXone (ROCEPHIN)  IV     PRN Meds:.acetaminophen **OR** acetaminophen, HYDROcodone-acetaminophen, oxyCODONE-acetaminophen  Micro Results Recent Results (from the past 240 hour(s))  MRSA PCR Screening     Status: Abnormal   Collection Time: 10/12/16  3:47 AM  Result Value Ref Range Status   MRSA by PCR POSITIVE (A) NEGATIVE Final    Comment:        The GeneXpert MRSA Assay (FDA approved for NASAL specimens only), is one component of a comprehensive MRSA colonization surveillance program. It is not intended to diagnose MRSA infection nor to guide or monitor treatment for MRSA infections. RESULT CALLED TO, READ BACK BY AND VERIFIED  WITH: A. Netta Corrigan RN 10:05 10/12/16 ([email or web address])     Radiology Reports Ct Head Wo Contrast  Result Date: 10/12/2016 CLINICAL DATA:  Headache and incoordination. EXAM: CT HEAD WITHOUT CONTRAST TECHNIQUE: Contiguous axial images were obtained from the base of the skull through the vertex without intravenous contrast. COMPARISON:  None. FINDINGS: Brain: A 1.4 cm thick right-sided subdural hematoma overlying the right parietal lobe with mixed attenuation suggesting a recent bleed is noted with approximately 3 mm of right to left midline shift. No hydrocephalus is identified. The basal cisterns and fourth ventricle are midline. No intraparenchymal hemorrhage is identified. No large vascular territory infarction is noted. Vascular: No hyperdense vessel or unexpected calcification. Skull: Normal. Negative for fracture or focal lesion. Sinuses/Orbits: No acute finding. Mild ethmoid sinus mucosal thickening. Intact orbits and globes. Mastoids are clear. Other: None IMPRESSION: Right-sided subdural hematoma overlying the parietal convexity measuring up to 1.4 cm in thickness with 3 mm of right to left midline shift. Given mixed attenuation within, findings likely represent a recent event. Critical Value/emergent results were called by telephone at the time of interpretation on 10/12/2016 at 12:08 am to Dr. Delora Fuel , who verbally acknowledged these results. Electronically Signed   By: Ashley Royalty M.D.   On: 10/12/2016 00:09   Mr Brain Wo Contrast  Result Date: 10/12/2016 CLINICAL DATA:  Subdural hematoma. Right arm weakness. History of CLL. EXAM: MRI HEAD WITHOUT CONTRAST TECHNIQUE: Multiplanar, multiecho pulse sequences of the brain and surrounding structures were obtained  without intravenous contrast. COMPARISON:  CT head 10/11/2016 FINDINGS: Brain: Right-sided subdural hematoma has mixed signal intensity with serum and layering and blood products. This appears to be subacute. Subdural blood in the right parietal  lobe measuring up to 14 mm in thickness unchanged from the prior CT. Small amount of subdural blood is present in the middle cranial fossa lateral and inferior to the temporal lobe. Methemoglobin is present within the subdural hematoma compatible with subacute blood. Small amount of subarachnoid hemorrhage is seen with hyperintense signal in the sulci on FLAIR imaging. Ventricle size normal. Mild midline shift to the left of 3 mm is unchanged. Negative for acute infarct. No significant chronic ischemic change. No mass lesion. Vascular: Normal arterial flow voids. Skull and upper cervical spine: Negative Sinuses/Orbits: Mild mucosal edema in the paranasal sinuses. Bilateral cataract removal. Other: None IMPRESSION: Subacute right-sided subdural hematoma measuring up to 14 mm in thickness in the right parietal lobe with small amount of blood in the right middle cranial fossa. 3 mm midline shift to the left is unchanged. Small amount of subarachnoid hemorrhage Negative for acute infarct. Electronically Signed   By: Franchot Gallo M.D.   On: 10/12/2016 07:53    Time Spent in minutes  25   Louellen Molder M.D on 10/13/2016 at 8:25 AM  Between 7am to 7pm - Pager - [phone]  After 7pm go to [email or web address] - password [facility]  [facility] -  Office  [phone]

## 2016-10-13 NOTE — Progress Notes (Signed)
   I have sent a message to the [place] office in [place] to arrange for a cardiology visit in 2 months per Dr. Victorino December request. The pt will be called to schedule.   Daune Perch, AGNP-C 10/13/2016  2:55 PM Pager: [phone]

## 2016-10-13 NOTE — Progress Notes (Signed)
Subjective: Patient continued to have right hemicranial headache, relieved by Percocet 2 tablets every 4 hours. Did have one episode of vomiting yesterday, he and his wife feel that the severity of the headache led to the vomiting. Labs this morning show potassium improved (4.5), but sodium decreased (131). Seen in cardiology consultation yesterday, they feel that he is low risk from a cardiovascular perspective for anesthesia and surgery.  Objective: Vital signs in last 24 hours: Vitals:   10/12/16 1947 10/12/16 2302 10/13/16 0236 10/13/16 0759  BP: (!) 141/80 120/83 (!) 158/82 (!) 137/91  Pulse: 68 69 62 75  Resp: 16 14 15 18   Temp: 97.9 F (36.6 C) 98.2 F (36.8 C) 98.3 F (36.8 C) 98 F (36.7 C)  TempSrc: Oral Oral Oral Oral  SpO2: 100% 98% 93% 93%  Weight:      Height:        Intake/Output from previous day: 05/17 0701 - 05/18 0700 In: 1320 [P.O.:1320] Out: 1100 [Urine:1100] Intake/Output this shift: No intake/output data recorded.  Physical Exam:  Patient awake and alert, oriented, following commands, speech fluent. Left hemiparetic.  CBC  Recent Labs  10/11/16 2326 10/13/16 0436  WBC 141.3* 142.4*  HGB 14.2 14.2  HCT 43.0 43.8  PLT 181 179   BMET  Recent Labs  10/11/16 2326 10/13/16 0436  NA 138 131*  K 3.2* 4.5  CL 103 97*  CO2 25 28  GLUCOSE 203* 135*  BUN 18 15  CREATININE 1.15 1.08  CALCIUM 9.4 9.5    Studies/Results: Ct Head Wo Contrast  Result Date: 10/12/2016 CLINICAL DATA:  Headache and incoordination. EXAM: CT HEAD WITHOUT CONTRAST TECHNIQUE: Contiguous axial images were obtained from the base of the skull through the vertex without intravenous contrast. COMPARISON:  None. FINDINGS: Brain: A 1.4 cm thick right-sided subdural hematoma overlying the right parietal lobe with mixed attenuation suggesting a recent bleed is noted with approximately 3 mm of right to left midline shift. No hydrocephalus is identified. The basal cisterns and fourth  ventricle are midline. No intraparenchymal hemorrhage is identified. No large vascular territory infarction is noted. Vascular: No hyperdense vessel or unexpected calcification. Skull: Normal. Negative for fracture or focal lesion. Sinuses/Orbits: No acute finding. Mild ethmoid sinus mucosal thickening. Intact orbits and globes. Mastoids are clear. Other: None IMPRESSION: Right-sided subdural hematoma overlying the parietal convexity measuring up to 1.4 cm in thickness with 3 mm of right to left midline shift. Given mixed attenuation within, findings likely represent a recent event. Critical Value/emergent results were called by telephone at the time of interpretation on 10/12/2016 at 12:08 am to Dr. Delora Fuel , who verbally acknowledged these results. Electronically Signed   By: Ashley Royalty M.D.   On: 10/12/2016 00:09   Mr Brain Wo Contrast  Result Date: 10/12/2016 CLINICAL DATA:  Subdural hematoma. Right arm weakness. History of CLL. EXAM: MRI HEAD WITHOUT CONTRAST TECHNIQUE: Multiplanar, multiecho pulse sequences of the brain and surrounding structures were obtained without intravenous contrast. COMPARISON:  CT head 10/11/2016 FINDINGS: Brain: Right-sided subdural hematoma has mixed signal intensity with serum and layering and blood products. This appears to be subacute. Subdural blood in the right parietal lobe measuring up to 14 mm in thickness unchanged from the prior CT. Small amount of subdural blood is present in the middle cranial fossa lateral and inferior to the temporal lobe. Methemoglobin is present within the subdural hematoma compatible with subacute blood. Small amount of subarachnoid hemorrhage is seen with hyperintense signal in the sulci  on FLAIR imaging. Ventricle size normal. Mild midline shift to the left of 3 mm is unchanged. Negative for acute infarct. No significant chronic ischemic change. No mass lesion. Vascular: Normal arterial flow voids. Skull and upper cervical spine: Negative  Sinuses/Orbits: Mild mucosal edema in the paranasal sinuses. Bilateral cataract removal. Other: None IMPRESSION: Subacute right-sided subdural hematoma measuring up to 14 mm in thickness in the right parietal lobe with small amount of blood in the right middle cranial fossa. 3 mm midline shift to the left is unchanged. Small amount of subarachnoid hemorrhage Negative for acute infarct. Electronically Signed   By: Franchot Gallo M.D.   On: 10/12/2016 07:53    Assessment/Plan: Patient with right frontoparietal mixed density (primarily chronic) subdural hematoma with mass effect and mild shift. Persistent significant headache, as well as 1 episode of vomiting yesterday. Cleared by cardiology for anesthesia and surgery. We'll plan on proceeding with craniotomy for evacuation of subdural hematoma tomorrow morning. Orders written including type and screen, preoperative antibiotics (Rocephin), requests for consent, nothing by mouth after midnight, etc. I've requested a repeat BMET for the a.m.  I spoke with the patient and his wife again regarding the condition, the nature of surgery, and its risks. Their questions regarding these matters were answered in detail for them.   Hosie Spangle, MD 10/13/2016, 8:09 AM

## 2016-10-14 ENCOUNTER — Inpatient Hospital Stay (HOSPITAL_COMMUNITY): Payer: Medicare HMO | Admitting: Anesthesiology

## 2016-10-14 ENCOUNTER — Encounter (HOSPITAL_COMMUNITY): Payer: Self-pay | Admitting: Certified Registered Nurse Anesthetist

## 2016-10-14 ENCOUNTER — Encounter (HOSPITAL_COMMUNITY)
Admission: EM | Disposition: A | Discharge status: Home / Self Care | Payer: Self-pay | Source: Home / Self Care | Attending: Internal Medicine

## 2016-10-14 ENCOUNTER — Inpatient Hospital Stay (HOSPITAL_COMMUNITY): Payer: Medicare HMO

## 2016-10-14 DIAGNOSIS — E871 Hypo-osmolality and hyponatremia: Secondary | ICD-10-CM

## 2016-10-14 DIAGNOSIS — I4891 Unspecified atrial fibrillation: Secondary | ICD-10-CM

## 2016-10-14 HISTORY — PX: CRANIOTOMY: SHX93

## 2016-10-14 LAB — BASIC METABOLIC PANEL
Anion gap: 9 (ref 5–15)
BUN: 17 mg/dL (ref 6–20)
CO2: 28 mmol/L (ref 22–32)
Calcium: 9.6 mg/dL (ref 8.9–10.3)
Chloride: 93 mmol/L — ABNORMAL LOW (ref 101–111)
Creatinine, Ser: 1.02 mg/dL (ref 0.61–1.24)
GFR calc Af Amer: >60 mL/min (ref >60)
GFR calc non Af Amer: >60 mL/min (ref >60)
Glucose, Bld: 117 mg/dL — ABNORMAL HIGH (ref 65–99)
Potassium: 3.6 mmol/L (ref 3.5–5.1)
Sodium: 130 mmol/L — ABNORMAL LOW (ref 135–145)

## 2016-10-14 LAB — ECHOCARDIOGRAM COMPLETE
HEIGHTINCHES: 74 in
Weight: 3680 oz

## 2016-10-14 LAB — PROTIME-INR
INR: 1.07
Prothrombin Time: 13.9 seconds (ref 11.4–15.2)

## 2016-10-14 LAB — HIV ANTIBODY (ROUTINE TESTING W REFLEX): HIV Screen 4th Generation wRfx: NONREACTIVE

## 2016-10-14 LAB — APTT: aPTT: 28 seconds (ref 24–36)

## 2016-10-14 SURGERY — CRANIOTOMY HEMATOMA EVACUATION SUBDURAL
Anesthesia: General | Site: Head

## 2016-10-14 MED ORDER — MORPHINE SULFATE (PF) 2 MG/ML IV SOLN: 1.0000 mg | INTRAVENOUS | Status: DC | PRN

## 2016-10-14 MED ORDER — LIDOCAINE HCL (CARDIAC) 20 MG/ML IV SOLN
INTRAVENOUS | Status: DC | PRN
  Administered 2016-10-14: 100 mg via INTRAVENOUS

## 2016-10-14 MED ORDER — BACITRACIN ZINC 500 UNIT/GM EX OINT
TOPICAL_OINTMENT | CUTANEOUS | Status: AC
  Filled 2016-10-14: qty 28.35

## 2016-10-14 MED ORDER — PHENYLEPHRINE HCL 10 MG/ML IJ SOLN
INTRAMUSCULAR | Status: DC | PRN
  Administered 2016-10-14 (×2): 120 ug via INTRAVENOUS
  Administered 2016-10-14: 80 ug via INTRAVENOUS

## 2016-10-14 MED ORDER — HYDRALAZINE HCL 20 MG/ML IJ SOLN: 5.0000 mg | INTRAMUSCULAR | Status: DC | PRN

## 2016-10-14 MED ORDER — SODIUM CHLORIDE 0.9 % IR SOLN
Status: DC | PRN
  Administered 2016-10-14: 500 mL

## 2016-10-14 MED ORDER — ROCURONIUM BROMIDE 10 MG/ML (PF) SYRINGE
PREFILLED_SYRINGE | INTRAVENOUS | Status: AC
  Filled 2016-10-14: qty 5

## 2016-10-14 MED ORDER — FENTANYL CITRATE (PF) 100 MCG/2ML IJ SOLN: 25.0000 ug | INTRAMUSCULAR | Status: DC | PRN

## 2016-10-14 MED ORDER — BUPIVACAINE HCL (PF) 0.5 % IJ SOLN
INTRAMUSCULAR | Status: AC
  Filled 2016-10-14: qty 30

## 2016-10-14 MED ORDER — BUPIVACAINE HCL (PF) 0.5 % IJ SOLN
INTRAMUSCULAR | Status: DC | PRN
  Administered 2016-10-14: 15 mL

## 2016-10-14 MED ORDER — OXYCODONE HCL 5 MG PO TABS: 5.0000 mg | ORAL_TABLET | Freq: Once | ORAL | Status: DC | PRN

## 2016-10-14 MED ORDER — PHENYLEPHRINE 40 MCG/ML (10ML) SYRINGE FOR IV PUSH (FOR BLOOD PRESSURE SUPPORT)
PREFILLED_SYRINGE | INTRAVENOUS | Status: AC
  Filled 2016-10-14: qty 10

## 2016-10-14 MED ORDER — DEXAMETHASONE SODIUM PHOSPHATE 4 MG/ML IJ SOLN
4.0000 mg | Freq: Four times a day (QID) | INTRAMUSCULAR | Status: AC
  Administered 2016-10-14 – 2016-10-15 (×4): 4 mg via INTRAVENOUS
  Filled 2016-10-14 (×4): qty 1

## 2016-10-14 MED ORDER — FENTANYL CITRATE (PF) 100 MCG/2ML IJ SOLN
INTRAMUSCULAR | Status: DC | PRN
  Administered 2016-10-14 (×2): 50 ug via INTRAVENOUS

## 2016-10-14 MED ORDER — SODIUM CHLORIDE 0.9 % IV SOLN
500.0000 mg | Freq: Two times a day (BID) | INTRAVENOUS | Status: DC
  Administered 2016-10-14 – 2016-10-16 (×5): 500 mg via INTRAVENOUS
  Filled 2016-10-14 (×5): qty 5

## 2016-10-14 MED ORDER — PROPOFOL 10 MG/ML IV BOLUS
INTRAVENOUS | Status: AC
  Filled 2016-10-14: qty 20

## 2016-10-14 MED ORDER — FENTANYL CITRATE (PF) 250 MCG/5ML IJ SOLN
INTRAMUSCULAR | Status: AC
  Filled 2016-10-14: qty 5

## 2016-10-14 MED ORDER — SODIUM CHLORIDE 0.9 % IV SOLN
INTRAVENOUS | Status: DC | PRN
  Administered 2016-10-14 (×2): via INTRAVENOUS

## 2016-10-14 MED ORDER — DEXTROSE 5 % IV SOLN
INTRAVENOUS | Status: DC | PRN
  Administered 2016-10-14: 2 g via INTRAVENOUS

## 2016-10-14 MED ORDER — THROMBIN 20000 UNITS EX SOLR
CUTANEOUS | Status: AC
  Filled 2016-10-14: qty 20000

## 2016-10-14 MED ORDER — LIDOCAINE-EPINEPHRINE 1 %-1:100000 IJ SOLN
INTRAMUSCULAR | Status: AC
  Filled 2016-10-14: qty 1

## 2016-10-14 MED ORDER — POTASSIUM CHLORIDE IN NACL 40-0.9 MEQ/L-% IV SOLN
INTRAVENOUS | Status: DC
  Administered 2016-10-14 – 2016-10-15 (×2): 100 mL/h via INTRAVENOUS
  Filled 2016-10-14 (×3): qty 1000

## 2016-10-14 MED ORDER — SODIUM CHLORIDE 0.9 % IV SOLN
500.0000 mg | INTRAVENOUS | Status: AC
  Filled 2016-10-14: qty 5

## 2016-10-14 MED ORDER — POTASSIUM CHLORIDE 10 MEQ/100ML IV SOLN
10.0000 meq | INTRAVENOUS | Status: AC
  Administered 2016-10-14 (×2): 10 meq via INTRAVENOUS
  Filled 2016-10-14 (×2): qty 100

## 2016-10-14 MED ORDER — MICROFIBRILLAR COLL HEMOSTAT EX PADS
MEDICATED_PAD | CUTANEOUS | Status: DC | PRN
Start: 2016-10-14 — End: 2016-10-14
  Administered 2016-10-14: 1 via TOPICAL

## 2016-10-14 MED ORDER — ONDANSETRON HCL 4 MG/2ML IJ SOLN
INTRAMUSCULAR | Status: DC | PRN
  Administered 2016-10-14: 4 mg via INTRAVENOUS

## 2016-10-14 MED ORDER — ONDANSETRON HCL 4 MG/2ML IJ SOLN
INTRAMUSCULAR | Status: AC
  Filled 2016-10-14: qty 2

## 2016-10-14 MED ORDER — ROCURONIUM BROMIDE 100 MG/10ML IV SOLN
INTRAVENOUS | Status: DC | PRN
  Administered 2016-10-14: 10 mg via INTRAVENOUS
  Administered 2016-10-14: 20 mg via INTRAVENOUS
  Administered 2016-10-14: 30 mg via INTRAVENOUS
  Administered 2016-10-14: 20 mg via INTRAVENOUS
  Administered 2016-10-14: 50 mg via INTRAVENOUS
  Administered 2016-10-14: 20 mg via INTRAVENOUS

## 2016-10-14 MED ORDER — PHENYLEPHRINE HCL 10 MG/ML IJ SOLN
INTRAVENOUS | Status: DC | PRN
  Administered 2016-10-14: 25 ug/min via INTRAVENOUS

## 2016-10-14 MED ORDER — MICROFIBRILLAR COLL HEMOSTAT EX PADS
MEDICATED_PAD | CUTANEOUS | Status: DC | PRN
  Administered 2016-10-14: 1 via TOPICAL

## 2016-10-14 MED ORDER — MORPHINE SULFATE (PF) 4 MG/ML IV SOLN
1.0000 mg | INTRAVENOUS | Status: DC | PRN
  Administered 2016-10-14 (×2): 2 mg via INTRAVENOUS
  Filled 2016-10-14 (×2): qty 1

## 2016-10-14 MED ORDER — METHYLENE BLUE 0.5 % INJ SOLN
INTRAVENOUS | Status: AC
  Filled 2016-10-14: qty 10

## 2016-10-14 MED ORDER — SUGAMMADEX SODIUM 200 MG/2ML IV SOLN
INTRAVENOUS | Status: DC | PRN
  Administered 2016-10-14: 300 mg via INTRAVENOUS

## 2016-10-14 MED ORDER — VANCOMYCIN HCL 10 G IV SOLR
1500.0000 mg | Freq: Once | INTRAVENOUS | Status: AC
  Administered 2016-10-14: 1500 mg via INTRAVENOUS
  Filled 2016-10-14: qty 1500

## 2016-10-14 MED ORDER — PROPOFOL 10 MG/ML IV BOLUS
INTRAVENOUS | Status: DC | PRN
  Administered 2016-10-14: 200 mg via INTRAVENOUS

## 2016-10-14 MED ORDER — 0.9 % SODIUM CHLORIDE (POUR BTL) OPTIME
TOPICAL | Status: DC | PRN
  Administered 2016-10-14 (×3): 1000 mL

## 2016-10-14 MED ORDER — LIDOCAINE-EPINEPHRINE 1 %-1:100000 IJ SOLN
INTRAMUSCULAR | Status: DC | PRN
  Administered 2016-10-14: 15 mL via INTRADERMAL

## 2016-10-14 MED ORDER — METHYLENE BLUE 0.5 % INJ SOLN
INTRAVENOUS | Status: DC | PRN
  Administered 2016-10-14: 1 mL

## 2016-10-14 MED ORDER — POVIDONE-IODINE 10 % EX OINT
TOPICAL_OINTMENT | CUTANEOUS | Status: AC
  Filled 2016-10-14: qty 28.35

## 2016-10-14 MED ORDER — OXYCODONE HCL 5 MG/5ML PO SOLN: 5.0000 mg | Freq: Once | ORAL | Status: DC | PRN

## 2016-10-14 MED ORDER — LABETALOL HCL 5 MG/ML IV SOLN: 5.0000 mg | INTRAVENOUS | Status: DC | PRN

## 2016-10-14 MED ORDER — THROMBIN 20000 UNITS EX SOLR
OROMUCOSAL | Status: DC | PRN
  Administered 2016-10-14: 20 mL

## 2016-10-14 MED ORDER — SUGAMMADEX SODIUM 200 MG/2ML IV SOLN
INTRAVENOUS | Status: AC
  Filled 2016-10-14: qty 2

## 2016-10-14 MED ORDER — THROMBIN 5000 UNITS EX SOLR
CUTANEOUS | Status: AC
  Filled 2016-10-14: qty 5000

## 2016-10-14 MED ORDER — DEXAMETHASONE SODIUM PHOSPHATE 10 MG/ML IJ SOLN
INTRAMUSCULAR | Status: DC | PRN
  Administered 2016-10-14: 10 mg via INTRAVENOUS

## 2016-10-14 MED ORDER — IRBESARTAN 150 MG PO TABS
300.0000 mg | ORAL_TABLET | Freq: Every day | ORAL | Status: DC
  Administered 2016-10-15 – 2016-10-16 (×2): 300 mg via ORAL
  Filled 2016-10-14 (×2): qty 2

## 2016-10-14 SURGICAL SUPPLY — 71 items
APPLICATOR COTTON TIP 6IN STRL (MISCELLANEOUS) ×2 IMPLANT
BAG DECANTER FOR FLEXI CONT (MISCELLANEOUS) ×2 IMPLANT
BANDAGE GAUZE 4  KLING STR (GAUZE/BANDAGES/DRESSINGS) ×2 IMPLANT
BIT DRILL WIRE PASS 1.3MM (BIT) IMPLANT
BNDG GAUZE ELAST 4 BULKY (GAUZE/BANDAGES/DRESSINGS) ×4 IMPLANT
BUR ACORN 6.0 PRECISION (BURR) ×2 IMPLANT
BUR SPIRAL ROUTER 2.3 (BUR) IMPLANT
CANISTER SUCT 3000ML PPV (MISCELLANEOUS) ×2 IMPLANT
CARTRIDGE OIL MAESTRO DRILL (MISCELLANEOUS) ×1 IMPLANT
CLIP TI MEDIUM 6 (CLIP) IMPLANT
DIFFUSER DRILL AIR PNEUMATIC (MISCELLANEOUS) ×2 IMPLANT
DRAIN PENROSE 1/2X12 LTX STRL (WOUND CARE) IMPLANT
DRAPE NEUROLOGICAL W/INCISE (DRAPES) ×2 IMPLANT
DRAPE SURG 17X23 STRL (DRAPES) IMPLANT
DRAPE WARM FLUID 44X44 (DRAPE) ×2 IMPLANT
DRILL WIRE PASS 1.3MM (BIT)
DRSG ADAPTIC 3X8 NADH LF (GAUZE/BANDAGES/DRESSINGS) ×2 IMPLANT
DRSG PAD ABDOMINAL 8X10 ST (GAUZE/BANDAGES/DRESSINGS) IMPLANT
DURAGUARD 04CMX04CM ×2 IMPLANT
ELECT CAUTERY BLADE 6.4 (BLADE) ×2 IMPLANT
ELECT REM PT RETURN 9FT ADLT (ELECTROSURGICAL) ×2
ELECTRODE REM PT RTRN 9FT ADLT (ELECTROSURGICAL) ×1 IMPLANT
EVACUATOR SILICONE 100CC (DRAIN) IMPLANT
GAUZE SPONGE 4X4 12PLY STRL (GAUZE/BANDAGES/DRESSINGS) ×2 IMPLANT
GAUZE SPONGE 4X4 16PLY XRAY LF (GAUZE/BANDAGES/DRESSINGS) IMPLANT
GLOVE BIO SURGEON STRL SZ 6.5 (GLOVE) ×2 IMPLANT
GLOVE BIO SURGEON STRL SZ7 (GLOVE) ×8 IMPLANT
GLOVE BIO SURGEON STRL SZ8 (GLOVE) ×2 IMPLANT
GLOVE BIOGEL PI IND STRL 8 (GLOVE) ×1 IMPLANT
GLOVE BIOGEL PI INDICATOR 8 (GLOVE) ×1
GLOVE ECLIPSE 7.5 STRL STRAW (GLOVE) ×2 IMPLANT
GLOVE EXAM NITRILE LRG STRL (GLOVE) IMPLANT
GLOVE EXAM NITRILE XL STR (GLOVE) IMPLANT
GLOVE EXAM NITRILE XS STR PU (GLOVE) IMPLANT
GOWN STRL REUS W/ TWL LRG LVL3 (GOWN DISPOSABLE) ×1 IMPLANT
GOWN STRL REUS W/ TWL XL LVL3 (GOWN DISPOSABLE) ×2 IMPLANT
GOWN STRL REUS W/TWL 2XL LVL3 (GOWN DISPOSABLE) IMPLANT
GOWN STRL REUS W/TWL LRG LVL3 (GOWN DISPOSABLE) ×1
GOWN STRL REUS W/TWL XL LVL3 (GOWN DISPOSABLE) ×2
HEMOSTAT SURGICEL 2X14 (HEMOSTASIS) ×2 IMPLANT
KIT BASIN OR (CUSTOM PROCEDURE TRAY) ×2 IMPLANT
KIT ROOM TURNOVER OR (KITS) ×2 IMPLANT
NEEDLE SPNL 22GX3.5 QUINCKE BK (NEEDLE) ×2 IMPLANT
NS IRRIG 1000ML POUR BTL (IV SOLUTION) ×6 IMPLANT
OIL CARTRIDGE MAESTRO DRILL (MISCELLANEOUS) ×2
PACK CRANIOTOMY (CUSTOM PROCEDURE TRAY) ×2 IMPLANT
PAD ARMBOARD 7.5X6 YLW CONV (MISCELLANEOUS) ×2 IMPLANT
PATTIES SURGICAL .5 X.5 (GAUZE/BANDAGES/DRESSINGS) IMPLANT
PATTIES SURGICAL .5 X3 (DISPOSABLE) IMPLANT
PATTIES SURGICAL 1/4 X 3 (GAUZE/BANDAGES/DRESSINGS) ×2 IMPLANT
PATTIES SURGICAL 1X1 (DISPOSABLE) IMPLANT
PIN MAYFIELD SKULL DISP (PIN) IMPLANT
PLATE 1.5  2HOLE MED NEURO (Plate) ×2 IMPLANT
PLATE 1.5 2HOLE MED NEURO (Plate) ×2 IMPLANT
PLATE 1.5/0.5 18.5MM BURR HOLE (Plate) ×2 IMPLANT
SCREW SELF DRILL HT 1.5/4MM (Screw) ×18 IMPLANT
SPECIMEN JAR SMALL (MISCELLANEOUS) IMPLANT
SPONGE NEURO XRAY DETECT 1X3 (DISPOSABLE) IMPLANT
SPONGE SURGIFOAM ABS GEL 100 (HEMOSTASIS) ×2 IMPLANT
STAPLER SKIN PROX WIDE 3.9 (STAPLE) ×2 IMPLANT
SUT ETHILON 3 0 FSL (SUTURE) IMPLANT
SUT NURALON 4 0 TR CR/8 (SUTURE) ×8 IMPLANT
SUT VIC AB 2-0 CP2 18 (SUTURE) ×4 IMPLANT
SYR CONTROL 10ML LL (SYRINGE) ×2 IMPLANT
TAPE CLOTH 2X10 TAN LF (GAUZE/BANDAGES/DRESSINGS) ×2 IMPLANT
TOWEL GREEN STERILE (TOWEL DISPOSABLE) ×2 IMPLANT
TOWEL GREEN STERILE FF (TOWEL DISPOSABLE) ×2 IMPLANT
TRAP SPECIMEN MUCOUS 40CC (MISCELLANEOUS) IMPLANT
TRAY FOLEY W/METER SILVER 16FR (SET/KITS/TRAYS/PACK) IMPLANT
UNDERPAD 30X30 (UNDERPADS AND DIAPERS) IMPLANT
WATER STERILE IRR 1000ML POUR (IV SOLUTION) ×2 IMPLANT

## 2016-10-14 NOTE — Progress Notes (Addendum)
PROGRESS NOTE                                                                                                                                                                                                             Patient Demographics:    Andrew [name], is a 76 y.o. male, DOB - 1940-08-20, EPP:[id number]  Admit date - 10/11/2016   Admitting Physician Etta Quill, DO  Outpatient Primary MD for the patient is [name], MD  LOS - 2  Outpatient Specialists: Oncology in [facility]  Chief Complaint  Patient presents with  . Migraine       Brief Narrative  76 year old male with history of A. fib on full dose aspirin, CLL with WBC in 140s (onseeing Dr Talbert Cage and is on Imbruvica for Binet Stage C CLL), fall at home about 4-5 months back and hit his head without loss of consciousness presented to [facility] ED with migraine like frontal headache for past one and half week. His oncologist had ordered an MRI of the brain to be done yesterday. For the past 2-3 days he also noticed his hands and feet becoming somewhat weak and had some unsteadiness while walking. He had trouble buttoning his shirt. Reports symptoms primarily affecting some skilled motor function rather than his strength. Denies any tingling or numbness, recent fall or loss of consciousness. In the ED CT of the head showed right frontoparietal subdural hematoma measuring up to 14 mm in thickness with 3 mm midline shift to the left. Neurosurgery consulted and patient transferred to [facility] for further management.   Subjective:   Seen after returning to neurosurgical ICU from OR. Denies any pain   Assessment  & Plan :    Principal Problem:   Acute/subacute Subdural hematoma (HCC) Associated mild midline shift. CT head and MRI brain shows subdural hematoma with small midline shift. No signs of stroke. Underwent right frontoparietal craniotomy with  evaluation of hematoma today.. tolerated well. Monitor in neurosurgical ICU post op.. Cannot use anticoagulant or antiplatelet agents for the next 1-2 months. -Aspirin discontinued. - discontinued his irutinib since it is associated with  Bleeding including gastrointestinal and intracerebral hemorrhage. (Almost up to 30% incidence of hemorrhage). Pain control as needed. PT/OT post op. Possibly D/c foley in am. keppra bid for seizure prophylaxis.   -Appreciate cardiology consult  for preoperative clearance. Continue perioperative beta blocker. Recommend long-term anticoagulation and possibly after 2 months. Another option is placement of a watchman device.    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 called his oncologist office and left a message but have not heard back. D/c the irutinib given its high risk of causing hemorrhage including postprocedure bleeding.  Atrial fibrillation (HCC) In sinus rhythm. Aspirin has been discontinued. Continue Coreg.  Essential hypertension On multiple medications including Coreg, amlodipine, Avapro which have been continued. Hold HCTZ given hyponatremia.  Hypokalemia Replenished.   History of gout Continue allopurinol   headache Possibly secondary to subdural hematoma and adverse effect of irutinib.  Resolved.     Code Status : Full code  Family Communication  : Wife at bedside  Disposition Plan  : Pending hospital course  Barriers For Discharge :  Active symptoms  Consults  :  Cardiology neurosurgery  Procedures  :  CT head MRI brain Craniotomy with evaluation of SDH  DVT Prophylaxis  :  SCDs  Lab Results  Component Value Date   PLT 179 10/13/2016    Antibiotics  :   Anti-infectives    Start     Dose/Rate Route Frequency Ordered Stop   10/14/16 0857  bacitracin 50,000 Units in sodium chloride irrigation 0.9 % 500 mL irrigation  Status:  Discontinued        As needed 10/14/16 0858 10/14/16 1105   10/14/16 0800  vancomycin (VANCOCIN) 1,500 mg in sodium chloride 0.9 % 500 mL IVPB     1,500 mg 250 mL/hr over 120 Minutes Intravenous  Once 10/14/16 0745 10/14/16 1020   10/13/16 0815  cefTRIAXone (ROCEPHIN) 2 g in dextrose 5 % 50 mL IVPB    Comments:  Send with patient to OR on 5/19 a.m. Do not administer on floor, to be given by anesthesia preoperatively in OR.   2 g 100 mL/hr over 30 Minutes Intravenous To Surgery 10/13/16 0808 10/14/16 0815   10/12/16 1000  acyclovir (ZOVIRAX) tablet 400 mg     400 mg Oral 2 times daily 10/12/16 0350          Objective:   Vitals:   10/14/16 1136 10/14/16 1149 10/14/16 1220 10/14/16 1300  BP: 119/77 126/70  115/84  Pulse: 77 80  76  Resp: (!) 21 18  15   Temp:   97.8 F (36.6 C)   TempSrc:   Oral   SpO2: 92% 94%  91%  Weight:      Height:        Wt Readings from Last 3 Encounters:  10/11/16 104.3 kg (230 lb)  10/05/16 103.4 kg (228 lb)  09/04/16 102.2 kg (225 lb 6.4 oz)     Intake/Output Summary (Last 24 hours) at 10/14/16 1314 Last data filed at 10/14/16 1300  Gross per 24 hour  Intake             1943 ml  Output             1690 ml  Net              253 ml     Physical Exam General: not in distress HEENT: dressing over scalp with drain, moist mucosa, supple neck Chest : clear b/l CVS: S1&S2 irregular, no murmurs GI: soft, NT, ND, BS+, foley+ Musculoskeletal: warm, no edema CNS: alert and oriented, moving all limbs     Data Review:    CBC  Recent Labs Lab 10/11/16 2326 10/13/16 0436  WBC 141.3* 142.4*  HGB 14.2 14.2  HCT 43.0 43.8  PLT 181 179  MCV 94.7 94.0  MCH 31.3 30.5  MCHC 33.0 32.4  RDW 12.6 12.5  LYMPHSABS 130.0* 132.5*  MONOABS 0.0* 1.4*  EOSABS 0.0 0.0  BASOSABS 0.0 0.0    Chemistries   Recent Labs Lab 10/11/16 2326 10/13/16 0436 10/14/16 0300  NA 138 131* 130*  K 3.2* 4.5 3.6  CL 103 97* 93*  CO2 25 28 28   GLUCOSE 203* 135* 117*  BUN 18 15  17   CREATININE 1.15 1.08 1.02  CALCIUM 9.4 9.5 9.6   ------------------------------------------------------------------------------------------------------------------ No results for input(s): CHOL, HDL, LDLCALC, TRIG, CHOLHDL, LDLDIRECT in the last 72 hours.  No results found for: HGBA1C ------------------------------------------------------------------------------------------------------------------  Recent Labs  10/12/16 0708  TSH 3.247   ------------------------------------------------------------------------------------------------------------------ No results for input(s): VITAMINB12, FOLATE, FERRITIN, TIBC, IRON, RETICCTPCT in the last 72 hours.  Coagulation profile  Recent Labs Lab 10/14/16 1133  INR 1.07    No results for input(s): DDIMER in the last 72 hours.  Cardiac Enzymes No results for input(s): CKMB, TROPONINI, MYOGLOBIN in the last 168 hours.  Invalid input(s): CK ------------------------------------------------------------------------------------------------------------------ No results found for: BNP  Inpatient Medications  Scheduled Meds: . acyclovir  400 mg Oral BID  . allopurinol  300 mg Oral BID  . amLODipine  10 mg Oral Daily  . carvedilol  12.5 mg Oral BID WC  . Chlorhexidine Gluconate Cloth  6 each Topical Q0600  . dexamethasone  4 mg Intravenous Q6H  . irbesartan  300 mg Oral Daily   And  . hydrochlorothiazide  25 mg Oral Daily  . multivitamin with minerals  1 tablet Oral Daily  . mupirocin ointment  1 application Nasal BID  . polyethylene glycol  17 g Oral Daily  . sodium chloride flush  3 mL Intravenous Q12H   Continuous Infusions: . 0.9 % NaCl with KCl 40 mEq / L 100 mL/hr (10/14/16 1236)  . levETIRAcetam    . levETIRAcetam    . potassium chloride 10 mEq (10/14/16 1227)   PRN Meds:.acetaminophen **OR** acetaminophen, hydrALAZINE, HYDROcodone-acetaminophen, labetalol, morphine injection, oxyCODONE-acetaminophen  Micro  Results Recent Results (from the past 240 hour(s))  MRSA PCR Screening     Status: Abnormal   Collection Time: 10/12/16  3:47 AM  Result Value Ref Range Status   MRSA by PCR POSITIVE (A) NEGATIVE Final    Comment:        The GeneXpert MRSA Assay (FDA approved for NASAL specimens only), is one component of a comprehensive MRSA colonization surveillance program. It is not intended to diagnose MRSA infection nor to guide or monitor treatment for MRSA infections. RESULT CALLED TO, READ BACK BY AND VERIFIED WITH: A. Netta Corrigan RN 10:05 10/12/16 ([email or web address])     Radiology Reports Ct Head Wo Contrast  Result Date: 10/12/2016 CLINICAL DATA:  Headache and incoordination. EXAM: CT HEAD WITHOUT CONTRAST TECHNIQUE: Contiguous axial images were obtained from the base of the skull through the vertex without intravenous contrast. COMPARISON:  None. FINDINGS: Brain: A 1.4 cm thick right-sided subdural hematoma overlying the right parietal lobe with mixed attenuation suggesting a recent bleed is noted with approximately 3 mm of right to left midline shift. No hydrocephalus is identified. The basal cisterns and fourth ventricle are midline. No intraparenchymal hemorrhage is identified. No large vascular territory infarction is noted. Vascular: No hyperdense vessel or unexpected calcification. Skull: Normal. Negative for fracture or focal lesion. Sinuses/Orbits:  No acute finding. Mild ethmoid sinus mucosal thickening. Intact orbits and globes. Mastoids are clear. Other: None IMPRESSION: Right-sided subdural hematoma overlying the parietal convexity measuring up to 1.4 cm in thickness with 3 mm of right to left midline shift. Given mixed attenuation within, findings likely represent a recent event. Critical Value/emergent results were called by telephone at the time of interpretation on 10/12/2016 at 12:08 am to Dr. Delora Fuel , who verbally acknowledged these results. Electronically Signed   By: Ashley Royalty M.D.   On:  10/12/2016 00:09   Mr Brain Wo Contrast  Result Date: 10/12/2016 CLINICAL DATA:  Subdural hematoma. Right arm weakness. History of CLL. EXAM: MRI HEAD WITHOUT CONTRAST TECHNIQUE: Multiplanar, multiecho pulse sequences of the brain and surrounding structures were obtained without intravenous contrast. COMPARISON:  CT head 10/11/2016 FINDINGS: Brain: Right-sided subdural hematoma has mixed signal intensity with serum and layering and blood products. This appears to be subacute. Subdural blood in the right parietal lobe measuring up to 14 mm in thickness unchanged from the prior CT. Small amount of subdural blood is present in the middle cranial fossa lateral and inferior to the temporal lobe. Methemoglobin is present within the subdural hematoma compatible with subacute blood. Small amount of subarachnoid hemorrhage is seen with hyperintense signal in the sulci on FLAIR imaging. Ventricle size normal. Mild midline shift to the left of 3 mm is unchanged. Negative for acute infarct. No significant chronic ischemic change. No mass lesion. Vascular: Normal arterial flow voids. Skull and upper cervical spine: Negative Sinuses/Orbits: Mild mucosal edema in the paranasal sinuses. Bilateral cataract removal. Other: None IMPRESSION: Subacute right-sided subdural hematoma measuring up to 14 mm in thickness in the right parietal lobe with small amount of blood in the right middle cranial fossa. 3 mm midline shift to the left is unchanged. Small amount of subarachnoid hemorrhage Negative for acute infarct. Electronically Signed   By: Franchot Gallo M.D.   On: 10/12/2016 07:53    Time Spent in minutes  25   Louellen Molder M.D on 10/14/2016 at 1:14 PM  Between 7am to 7pm - Pager - [phone]  After 7pm go to [email or web address] - password [facility]  [facility] -  Office  [phone]

## 2016-10-14 NOTE — Op Note (Signed)
10/11/2016 - 10/14/2016  10:56 AM  PATIENT:  Andrew [name]  76 y.o. male  PRE-OPERATIVE DIAGNOSIS:  Right frontoparietal subdural hematoma with left hemiparesis  POST-OPERATIVE DIAGNOSIS:  Right frontoparietal subacute subdural hematoma with left hemiparesis  PROCEDURE:  Procedure(s):  RIGHT FRONTOPARIETAL CRANIOTOMY FOR EVACUATION OF SUBDURAL HEMATOMA  SURGEON:  Surgeon(s): Jovita Gamma, MD Eustace Moore, MD  ASSISTANTS: Sherley Bounds, M.D.  ANESTHESIA:   general  EBL:  Total I/O In: 1300 [I.V.:1300] Out: 925 [Urine:775; Blood:150]  BLOOD ADMINISTERED:none  COUNT: Correct per nursing staff  DRAINS: (10 mm wide) Jackson-Pratt drain(s) with closed bulb suction in the Subdural space   SPECIMEN:  Source of Specimen:  Subdural hematoma sent in formalin for permanent pathologic examination  DICTATION:  Patient was brought to the operating room, placed under general endotracheal anesthesia. The patient was placed in a 3 pin Mayfield head holder and turned to a right side up lateral decubitus position. The right frontoparietal scalp was shaved, and then prepped with Betadine soap and solution and draped in a sterile fashion. The incision was infiltrated with local anesthetic with epinephrine. A parasagittal incision was made from the right frontal boss to just behind the right parietal boss. Raney clips were applied to the scalp edges to maintain hemostasis. Self retaining retractors were placed. 2 bur holes were made with the high-speed drill. The dura was dissected from the overlying skull, and then the craniotome attachment was used to turn a right frontoparietal bone flap. The dura was opened in a U-shaped fashion hinge towards the midline. The subdural hematoma was immediately visualized. It had a thin friable membrane, with portions of the hematoma that were dark red liquid and other portions that were dark red clot, with the overall appearance consistent with a subacute subdural  hematoma. Portions of the subdural hematoma were sent to pathology in formalin for permanent examination. The hematoma was gently mobilized off the brain surface with warm saline irrigation. There was bloody oozing from the thickened portions of the subdural membrane. The edges of the membrane were coagulated, and fibrillar Surgicel was applied to the membrane services to establish hemostasis. The brain was noted to be pulsating gently. Hemoclips were applied to the dural edges to secure the subdural membranes to the dura. The subdural space was gently irrigated extensively with warm saline solution until clear. We then placed a 10 mm wide flat Jackson-Pratt drain in the subdural space that was brought out through a separate stab incision. The dura was then closed with interrupted 4-0 Nurolon suture. A small dural patch was placed using Duraguard.  The dura was tacked up around the margins of the craniotomy with 4-0 Nurolon sutures. We then replaced the bone flap, which was secured with Lorenz cranial plates. We used one bur hole cover for the posterior bur hole (the Jackson-Pratt drain was brought out through the anterior bur hole) and we used 2 medium straight plates. The scalp was then closed with interrupted inverted 2-0 undyed Vicryl sutures closing the galea layer. Skin edges were approximated with surgical staples. The drain was sutured to the scalp with a 3-0 nylon suture. Adaptic was placed over the skin incision, and then dressing sponges were applied. The head was wrapped with 2 Kling and then 2 Kerlex. Following surgery the patient is to be reversed the anesthetic, extubated, and transferred to the recovery room for further care.  PLAN OF CARE: Recovery room, and then neurosurgical ICU  PATIENT DISPOSITION:  PACU - hemodynamically stable.  Delay start of Pharmacological VTE agent (>24hrs) due to surgical blood loss or risk of bleeding:  yes

## 2016-10-14 NOTE — Progress Notes (Signed)
Subjective: Patient in PACU. Extubated.   Objective: Vital signs in last 24 hours: Vitals:   10/13/16 1722 10/13/16 1947 10/14/16 0024 10/14/16 0340  BP: (!) 151/89 113/88 (!) 141/93 (!) 147/97  Pulse: 71 69 75 80  Resp: (!) 21 (!) 22 13 17   Temp:  98.3 F (36.8 C) 98.5 F (36.9 C) 98.5 F (36.9 C)  TempSrc:  Oral Oral Oral  SpO2:  94% 95% 95%  Weight:      Height:        Intake/Output from previous day: 05/18 0701 - 05/19 0700 In: 903 [P.O.:900; I.V.:3] Out: 550 [Urine:550] Intake/Output this shift: Total I/O In: 1300 [I.V.:1300] Out: 925 [Urine:775; Blood:150]  Physical Exam:  Pulse 80. Blood pressure 126/80.  Opening eyes. Restless, but following commands. Moving all 4 extremities, but left hemiparetic.  CBC  Recent Labs  10/11/16 2326 10/13/16 0436  WBC 141.3* 142.4*  HGB 14.2 14.2  HCT 43.0 43.8  PLT 181 179   BMET  Recent Labs  10/13/16 0436 10/14/16 0300  NA 131* 130*  K 4.5 3.6  CL 97* 93*  CO2 28 28  GLUCOSE 135* 117*  BUN 15 17  CREATININE 1.08 1.02  CALCIUM 9.5 9.6    Assessment/Plan: We'll check a PT/PTT. Monitor in PACU and then 4 N. neurosurgery ICU.   Hosie Spangle, MD 10/14/2016, 11:29 AM

## 2016-10-14 NOTE — Progress Notes (Signed)
Subjective: Patient resting in bed. Moderate headache, given both morphine IV and Percocet by mouth as needed for pain. PT/PTT done today, normal. Limited amount of bloody drainage into the JP drain.  Objective: Vital signs in last 24 hours: Vitals:   10/14/16 1400 10/14/16 1500 10/14/16 1541 10/14/16 1600  BP: 124/83 (!) 143/86  139/88  Pulse: 74 79  84  Resp: 10 16  16   Temp:   98.4 F (36.9 C)   TempSrc:   Oral   SpO2: 96% 94%  93%  Weight:      Height:        Intake/Output from previous day: 05/18 0701 - 05/19 0700 In: 903 [P.O.:900; I.V.:3] Out: 550 [Urine:550] Intake/Output this shift: Total I/O In: 1940 [I.V.:1640; Other:300] Out: 1380 [Urine:1200; Drains:30; Blood:150]  Physical Exam:  Has steadily become more alert and appropriate. Currently awake and alert, oriented to name, [facility] hospital, and May 2018. Following commands. Speech fluent. Mild left pronator drift, the drift is noticeably less than prior to surgery.  CBC  Recent Labs  10/11/16 2326 10/13/16 0436  WBC 141.3* 142.4*  HGB 14.2 14.2  HCT 43.0 43.8  PLT 181 179   BMET  Recent Labs  10/13/16 0436 10/14/16 0300  NA 131* 130*  K 4.5 3.6  CL 97* 93*  CO2 28 28  GLUCOSE 135* 117*  BUN 15 17  CREATININE 1.08 1.02  CALCIUM 9.5 9.6    Assessment/Plan: Doing well following craniotomy for evacuation of subdural hematoma. We will begin clear liquids. If well tolerated a.m., can be advanced to regular diet, Foley can be DC'd, and we can begin mobilization to the bedside chair, and then progressively ambulate. Will request CT of brain without contrast for 5/21 AM.   Hosie Spangle, MD 10/14/2016, 4:24 PM

## 2016-10-14 NOTE — Anesthesia Postprocedure Evaluation (Addendum)
Anesthesia Post Note  Patient: Andrew [name]  Procedure(s) Performed: Procedure(s) (LRB): CRANIOTOMY HEMATOMA EVACUATION SUBDURAL (N/A)  Patient location during evaluation: PACU Anesthesia Type: General Level of consciousness: awake Pain management: pain level controlled Vital Signs Assessment: post-procedure vital signs reviewed and stable Respiratory status: spontaneous breathing, nonlabored ventilation, respiratory function stable and patient connected to nasal cannula oxygen Cardiovascular status: stable Postop Assessment: no signs of nausea or vomiting Anesthetic complications: no       Last Vitals:  Vitals:   10/14/16 1149 10/14/16 1220  BP: 126/70   Pulse: 80   Resp: 18   Temp:  36.6 C    Last Pain:  Vitals:   10/14/16 1220  TempSrc: Oral  PainSc:                  [name]

## 2016-10-14 NOTE — Transfer of Care (Signed)
Immediate Anesthesia Transfer of Care Note  Patient: Andrew [name]  Procedure(s) Performed: Procedure(s) with comments: CRANIOTOMY HEMATOMA EVACUATION SUBDURAL (N/A) - CRANIOTOMY HEMATOMA EVACUATION SUBDURAL  Patient Location: PACU  Anesthesia Type:General  Level of Consciousness: awake, pateint uncooperative and confused  Airway & Oxygen Therapy: Patient Spontanous Breathing and Patient connected to face mask oxygen  Post-op Assessment: Report given to RN, Post -op Vital signs reviewed and stable and Patient moving all extremities X 4  Post vital signs: Reviewed and stable  Last Vitals:  Vitals:   10/14/16 0024 10/14/16 0340  BP: (!) 141/93 (!) 147/97  Pulse: 75 80  Resp: 13 17  Temp: 36.9 C 36.9 C    Last Pain:  Vitals:   10/14/16 0340  TempSrc: Oral  PainSc: 7       Patients Stated Pain Goal: 0 (69/79/48 0165)  Complications: No apparent anesthesia complications

## 2016-10-14 NOTE — Progress Notes (Signed)
  Echocardiogram 2D Echocardiogram has been performed.  Andrew [name] 10/14/2016, 3:26 PM

## 2016-10-14 NOTE — Anesthesia Procedure Notes (Signed)

## 2016-10-14 NOTE — Progress Notes (Signed)
Vitals:   10/13/16 1722 10/13/16 1947 10/14/16 0024 10/14/16 0340  BP: (!) 151/89 113/88 (!) 141/93 (!) 147/97  Pulse: 71 69 75 80  Resp: (!) 21 (!) 22 13 17   Temp:  98.3 F (36.8 C) 98.5 F (36.9 C) 98.5 F (36.9 C)  TempSrc:  Oral Oral Oral  SpO2:  94% 95% 95%  Weight:      Height:        CBC  Recent Labs  10/11/16 2326 10/13/16 0436  WBC 141.3* 142.4*  HGB 14.2 14.2  HCT 43.0 43.8  PLT 181 179   BMET  Recent Labs  10/13/16 0436 10/14/16 0300  NA 131* 130*  K 4.5 3.6  CL 97* 93*  CO2 28 28  GLUCOSE 135* 117*  BUN 15 17  CREATININE 1.08 1.02  CALCIUM 9.5 9.6    Patient seen in preop area. He continues to have headache, and is continuing to use Percocet or Norco. He has had some continued nausea, but no further vomiting.  On exam he has more pronounced pronator drift, but remains awake, alert, and oriented.  Plan: For craniotomy for evacuation of subdural hematoma.  Hosie Spangle, MD 10/14/2016, 7:41 AM

## 2016-10-14 NOTE — Progress Notes (Signed)
Andrew [name] taken to pre-op holding (bay 63) by RN and NT. CRNA Vern in holding and assisted RN in placing patient. All personal affects taken by pt's wife, room clear. VSS.

## 2016-10-14 NOTE — Anesthesia Preprocedure Evaluation (Signed)
Anesthesia Evaluation  Patient identified by MRN, date of birth, ID band Patient awake    Reviewed: Allergy & Precautions, NPO status , Patient's Chart, lab work & pertinent test results  History of Anesthesia Complications Negative for: history of anesthetic complications  Airway Mallampati: II  TM Distance: >3 FB Neck ROM: Full    Dental  (+) Teeth Intact   Pulmonary neg shortness of breath, neg sleep apnea, neg COPD, neg recent URI, former smoker,    breath sounds clear to auscultation       Cardiovascular hypertension, Pt. on medications and Pt. on home beta blockers + dysrhythmias Atrial Fibrillation  Rhythm:Regular     Neuro/Psych negative neurological ROS  negative psych ROS   GI/Hepatic negative GI ROS,   Endo/Other    Renal/GU negative Renal ROS     Musculoskeletal   Abdominal   Peds  Hematology CLL   Anesthesia Other Findings   Reproductive/Obstetrics                             Anesthesia Physical Anesthesia Plan  ASA: III  Anesthesia Plan: General   Post-op Pain Management:    Induction: Intravenous  Airway Management Planned: Oral ETT  Additional Equipment: None  Intra-op Plan:   Post-operative Plan: Extubation in OR  Informed Consent: I have reviewed the patients History and Physical, chart, labs and discussed the procedure including the risks, benefits and alternatives for the proposed anesthesia with the patient or authorized representative who has indicated his/her understanding and acceptance.   Dental advisory given  Plan Discussed with: CRNA and Surgeon  Anesthesia Plan Comments:         Anesthesia Quick Evaluation

## 2016-10-15 DIAGNOSIS — E875 Hyperkalemia: Secondary | ICD-10-CM

## 2016-10-15 LAB — CBC WITH DIFFERENTIAL/PLATELET
Basophils Absolute: 0 10*3/uL (ref 0.0–0.1)
Basophils Relative: 0 %
Eosinophils Absolute: 0 10*3/uL (ref 0.0–0.7)
Eosinophils Relative: 0 %
HCT: 40.2 % (ref 39.0–52.0)
Hemoglobin: 12.8 g/dL — ABNORMAL LOW (ref 13.0–17.0)
Lymphocytes Relative: 88 %
Lymphs Abs: 117.3 10*3/uL — ABNORMAL HIGH (ref 0.7–4.0)
MCH: 30 pg (ref 26.0–34.0)
MCHC: 31.8 g/dL (ref 30.0–36.0)
MCV: 94.1 fL (ref 78.0–100.0)
Monocytes Absolute: 2.7 10*3/uL — ABNORMAL HIGH (ref 0.1–1.0)
Monocytes Relative: 2 %
Neutro Abs: 13.3 10*3/uL — ABNORMAL HIGH (ref 1.7–7.7)
Neutrophils Relative %: 10 %
Platelets: 184 10*3/uL (ref 150–400)
RBC: 4.27 MIL/uL (ref 4.22–5.81)
RDW: 12.8 % (ref 11.5–15.5)
WBC: 133.3 10*3/uL (ref 4.0–10.5)

## 2016-10-15 LAB — BASIC METABOLIC PANEL
Anion gap: 7 (ref 5–15)
BUN: 18 mg/dL (ref 6–20)
CO2: 24 mmol/L (ref 22–32)
Calcium: 8.5 mg/dL — ABNORMAL LOW (ref 8.9–10.3)
Chloride: 101 mmol/L (ref 101–111)
Creatinine, Ser: 1.03 mg/dL (ref 0.61–1.24)
GFR calc Af Amer: >60 mL/min (ref >60)
GFR calc non Af Amer: >60 mL/min (ref >60)
Glucose, Bld: 148 mg/dL — ABNORMAL HIGH (ref 65–99)
Potassium: 5.1 mmol/L (ref 3.5–5.1)
Sodium: 132 mmol/L — ABNORMAL LOW (ref 135–145)

## 2016-10-15 NOTE — Progress Notes (Signed)
Patient ID: Andrew [name], male   DOB: 15-Jul-1940, 76 y.o.   MRN: [id number] Looks good this morning. Denies headache. Moving both sides equally. No pronator drift. Incision clean dry and intact. Already out of bed to chair. We'll mobilize today. We'll leave the drain in 1 more day. Follow-up scan tomorrow.

## 2016-10-15 NOTE — Progress Notes (Addendum)
PROGRESS NOTE                                                                                                                                                                                                             Patient Demographics:    Andrew [name], is a 76 y.o. male, DOB - [birth year], OIB:[id number]  Admit date - 10/11/2016   Admitting Physician Etta Quill, DO  Outpatient Primary MD for the patient is [name], MD  LOS - 3  Outpatient Specialists: Oncology in [facility]  Chief Complaint  Patient presents with  . Migraine       Brief Narrative  76 year old male with history of A. fib on full dose aspirin, CLL with WBC in 140s (onseeing Dr Talbert Cage and is on Imbruvica for Binet Stage C CLL), fall at home about 4-5 months back and hit his head without loss of consciousness presented to [facility] ED with migraine like frontal headache for past one and half week. His oncologist had ordered an MRI of the brain to be done yesterday. For the past 2-3 days he also noticed his hands and feet becoming somewhat weak and had some unsteadiness while walking. He had trouble buttoning his shirt. Reports symptoms primarily affecting some skilled motor function rather than his strength. Denies any tingling or numbness, recent fall or loss of consciousness. In the ED CT of the head showed right frontoparietal subdural hematoma measuring up to 14 mm in thickness with 3 mm midline shift to the left. Neurosurgery consulted and patient transferred to [facility] for further management.   Subjective:  Seen this morning. Reports some pain over surgical site. Stable overnight. Feels his left-handed movement to be much improved.   Assessment  & Plan :    Principal Problem:   Acute/subacute Subdural hematoma (HCC) Associated mild midline shift and fine motor skill deficit in the left hand.  Underwent right frontoparietal craniotomy  with evacuation of hematoma on 5/19. Tolerated well and stable on neurosurgical ICU postop. Cannot use anticoagulation or antiplatelet agent for the next 2 month. Aspirin has been discontinued. -Also discontinued his irutinib since it is associated with bleeding including GI and intracerebral hemorrhage (almost up to 30% incidence of hemorrhage). -Pain control with oxycodone as needed. -Keppra for seizure prophylaxis. --Discontinue Foley, advanced diet and up with therapy.  Neurosurgery following. Recommend repeat CT head with contrast in a.m.  -Appreciate cardiology consult for preoperative clearance. Continue perioperative beta blocker. Recommend long-term anticoagulation and possibly after 2 months. Another option is placement of a watchman device. 2-D echo with normal EF and no wall motion abnormality.    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 This is being held due to high risk for causing hemorrhage including postprocedure bleeding. Was unable to use his oncologist in [place]. Will call again tomorrow.   Atrial fibrillation (HCC) Aspirin discontinued. Remains in sinus rhythm. Continue Coreg.  Essential hypertension Continue Coreg, amlodipine and Avapro. Hold HCTZ due to hyponatremia.  Hypokalemia Replenished, now mildly hypokalemic. DC potassium supplement and IV fluid.  History of gout Continue allopurinol    Code Status : Full code  Family Communication  : Wife at bedside  Disposition Plan  : Pending hospital course  Barriers For Discharge :  Active symptoms  Consults  :  Cardiology neurosurgery  Procedures  :  CT head MRI brain Craniotomy with evaluation of SDH  DVT Prophylaxis  :  SCDs  Lab Results  Component Value Date   PLT 184 10/15/2016    Antibiotics  :   Anti-infectives    Start     Dose/Rate Route Frequency Ordered Stop   10/14/16 0857  bacitracin 50,000 Units in sodium  chloride irrigation 0.9 % 500 mL irrigation  Status:  Discontinued       As needed 10/14/16 0858 10/14/16 1105   10/14/16 0800  vancomycin (VANCOCIN) 1,500 mg in sodium chloride 0.9 % 500 mL IVPB     1,500 mg 250 mL/hr over 120 Minutes Intravenous  Once 10/14/16 0745 10/14/16 1020   10/13/16 0815  cefTRIAXone (ROCEPHIN) 2 g in dextrose 5 % 50 mL IVPB    Comments:  Send with patient to OR on 5/19 a.m. Do not administer on floor, to be given by anesthesia preoperatively in OR.   2 g 100 mL/hr over 30 Minutes Intravenous To Surgery 10/13/16 0808 10/14/16 0815   10/12/16 1000  acyclovir (ZOVIRAX) tablet 400 mg     400 mg Oral 2 times daily 10/12/16 0350          Objective:   Vitals:   10/15/16 0700 10/15/16 0756 10/15/16 0800 10/15/16 0837  BP: 126/84  123/82   Pulse: 64  72 78  Resp: 16  15   Temp:  98.6 F (37 C)    TempSrc:  Oral    SpO2: 93%  94%   Weight:      Height:        Wt Readings from Last 3 Encounters:  10/11/16 104.3 kg (230 lb)  10/05/16 103.4 kg (228 lb)  09/04/16 102.2 kg (225 lb 6.4 oz)     Intake/Output Summary (Last 24 hours) at 10/15/16 1016 Last data filed at 10/15/16 0851  Gross per 24 hour  Intake             3130 ml  Output             2695 ml  Net              435 ml     Physical Exam Gen.: Elderly male not in distress HEENT: Staples over right parietal scalp with drain, scant bleeding, moist mucosa, supple neck Chest: Clear to auscultation bilaterally   CVS: S1 and S2 irregular, no murmurs GI: Soft, nondistended, nontender Musculoskeletal: Warm, no edema  CNS: Alert and oriented, improved left pronator     Data Review:    CBC  Recent Labs Lab 10/11/16 2326 10/13/16 0436 10/15/16 0243  WBC 141.3* 142.4* 133.3*  HGB 14.2 14.2 12.8*  HCT 43.0 43.8 40.2  PLT 181 179 184  MCV 94.7 94.0 94.1  MCH 31.3 30.5 30.0  MCHC 33.0 32.4 31.8  RDW 12.6 12.5 12.8  LYMPHSABS 130.0* 132.5* 117.3*  MONOABS 0.0* 1.4* 2.7*  EOSABS 0.0 0.0 0.0    BASOSABS 0.0 0.0 0.0    Chemistries   Recent Labs Lab 10/11/16 2326 10/13/16 0436 10/14/16 0300 10/15/16 0243  NA 138 131* 130* 132*  K 3.2* 4.5 3.6 5.1  CL 103 97* 93* 101  CO2 25 28 28 24   GLUCOSE 203* 135* 117* 148*  BUN 18 15 17 18   CREATININE 1.15 1.08 1.02 1.03  CALCIUM 9.4 9.5 9.6 8.5*   ------------------------------------------------------------------------------------------------------------------ No results for input(s): CHOL, HDL, LDLCALC, TRIG, CHOLHDL, LDLDIRECT in the last 72 hours.  No results found for: HGBA1C ------------------------------------------------------------------------------------------------------------------ No results for input(s): TSH, T4TOTAL, T3FREE, THYROIDAB in the last 72 hours.  Invalid input(s): FREET3 ------------------------------------------------------------------------------------------------------------------ No results for input(s): VITAMINB12, FOLATE, FERRITIN, TIBC, IRON, RETICCTPCT in the last 72 hours.  Coagulation profile  Recent Labs Lab 10/14/16 1133  INR 1.07    No results for input(s): DDIMER in the last 72 hours.  Cardiac Enzymes No results for input(s): CKMB, TROPONINI, MYOGLOBIN in the last 168 hours.  Invalid input(s): CK ------------------------------------------------------------------------------------------------------------------ No results found for: BNP  Inpatient Medications  Scheduled Meds: . acyclovir  400 mg Oral BID  . allopurinol  300 mg Oral BID  . amLODipine  10 mg Oral Daily  . carvedilol  12.5 mg Oral BID WC  . Chlorhexidine Gluconate Cloth  6 each Topical Q0600  . irbesartan  300 mg Oral Daily  . multivitamin with minerals  1 tablet Oral Daily  . mupirocin ointment  1 application Nasal BID  . polyethylene glycol  17 g Oral Daily  . sodium chloride flush  3 mL Intravenous Q12H   Continuous Infusions: . levETIRAcetam Stopped (10/14/16 2220)   PRN Meds:.acetaminophen **OR**  acetaminophen, hydrALAZINE, HYDROcodone-acetaminophen, labetalol, morphine injection, oxyCODONE-acetaminophen  Micro Results Recent Results (from the past 240 hour(s))  MRSA PCR Screening     Status: Abnormal   Collection Time: 10/12/16  3:47 AM  Result Value Ref Range Status   MRSA by PCR POSITIVE (A) NEGATIVE Final    Comment:        The GeneXpert MRSA Assay (FDA approved for NASAL specimens only), is one component of a comprehensive MRSA colonization surveillance program. It is not intended to diagnose MRSA infection nor to guide or monitor treatment for MRSA infections. RESULT CALLED TO, READ BACK BY AND VERIFIED WITH: A. Netta Corrigan RN 10:05 10/12/16 ([email or web address])     Radiology Reports Ct Head Wo Contrast  Result Date: 10/12/2016 CLINICAL DATA:  Headache and incoordination. EXAM: CT HEAD WITHOUT CONTRAST TECHNIQUE: Contiguous axial images were obtained from the base of the skull through the vertex without intravenous contrast. COMPARISON:  None. FINDINGS: Brain: A 1.4 cm thick right-sided subdural hematoma overlying the right parietal lobe with mixed attenuation suggesting a recent bleed is noted with approximately 3 mm of right to left midline shift. No hydrocephalus is identified. The basal cisterns and fourth ventricle are midline. No intraparenchymal hemorrhage is identified. No large vascular territory infarction is noted. Vascular: No hyperdense vessel or unexpected calcification. Skull: Normal. Negative for fracture or  focal lesion. Sinuses/Orbits: No acute finding. Mild ethmoid sinus mucosal thickening. Intact orbits and globes. Mastoids are clear. Other: None IMPRESSION: Right-sided subdural hematoma overlying the parietal convexity measuring up to 1.4 cm in thickness with 3 mm of right to left midline shift. Given mixed attenuation within, findings likely represent a recent event. Critical Value/emergent results were called by telephone at the time of interpretation on 10/12/2016 at  12:08 am to Dr. Delora Fuel , who verbally acknowledged these results. Electronically Signed   By: Ashley Royalty M.D.   On: 10/12/2016 00:09   Mr Brain Wo Contrast  Result Date: 10/12/2016 CLINICAL DATA:  Subdural hematoma. Right arm weakness. History of CLL. EXAM: MRI HEAD WITHOUT CONTRAST TECHNIQUE: Multiplanar, multiecho pulse sequences of the brain and surrounding structures were obtained without intravenous contrast. COMPARISON:  CT head 10/11/2016 FINDINGS: Brain: Right-sided subdural hematoma has mixed signal intensity with serum and layering and blood products. This appears to be subacute. Subdural blood in the right parietal lobe measuring up to 14 mm in thickness unchanged from the prior CT. Small amount of subdural blood is present in the middle cranial fossa lateral and inferior to the temporal lobe. Methemoglobin is present within the subdural hematoma compatible with subacute blood. Small amount of subarachnoid hemorrhage is seen with hyperintense signal in the sulci on FLAIR imaging. Ventricle size normal. Mild midline shift to the left of 3 mm is unchanged. Negative for acute infarct. No significant chronic ischemic change. No mass lesion. Vascular: Normal arterial flow voids. Skull and upper cervical spine: Negative Sinuses/Orbits: Mild mucosal edema in the paranasal sinuses. Bilateral cataract removal. Other: None IMPRESSION: Subacute right-sided subdural hematoma measuring up to 14 mm in thickness in the right parietal lobe with small amount of blood in the right middle cranial fossa. 3 mm midline shift to the left is unchanged. Small amount of subarachnoid hemorrhage Negative for acute infarct. Electronically Signed   By: Franchot Gallo M.D.   On: 10/12/2016 07:53    Time Spent in minutes  25   Louellen Molder M.D on 10/15/2016 at 10:16 AM  Between 7am to 7pm - Pager - [phone]  After 7pm go to [email or web address] - password [facility]  [facility] -  Office  [phone]

## 2016-10-16 ENCOUNTER — Encounter (HOSPITAL_COMMUNITY): Payer: Self-pay | Admitting: Neurosurgery

## 2016-10-16 ENCOUNTER — Inpatient Hospital Stay (HOSPITAL_COMMUNITY): Payer: Medicare HMO

## 2016-10-16 LAB — BASIC METABOLIC PANEL
Anion gap: 6 (ref 5–15)
BUN: 24 mg/dL — AB (ref 6–20)
CO2: 24 mmol/L (ref 22–32)
CREATININE: 0.9 mg/dL (ref 0.61–1.24)
Calcium: 8.4 mg/dL — ABNORMAL LOW (ref 8.9–10.3)
Chloride: 107 mmol/L (ref 101–111)
GFR calc Af Amer: >60 mL/min (ref >60)
Glucose, Bld: 111 mg/dL — ABNORMAL HIGH (ref 65–99)
Potassium: 4.6 mmol/L (ref 3.5–5.1)
SODIUM: 137 mmol/L (ref 135–145)

## 2016-10-16 MED ORDER — LEVETIRACETAM ER 500 MG PO TB24
500.0000 mg | ORAL_TABLET | Freq: Two times a day (BID) | ORAL | Status: DC
  Administered 2016-10-16 – 2016-10-18 (×4): 500 mg via ORAL
  Filled 2016-10-16 (×5): qty 1

## 2016-10-16 MED ORDER — MORPHINE SULFATE (PF) 4 MG/ML IV SOLN: 4.0000 mg | INTRAVENOUS | Status: DC | PRN

## 2016-10-16 NOTE — Progress Notes (Signed)
PROGRESS NOTE                                                                                                                                                                                                             Patient Demographics:    Andrew [name], is a 76 y.o. male, DOB - January 29, 1941, JME:[id number]  Admit date - 10/11/2016   Admitting Physician Etta Quill, DO  Outpatient Primary MD for the patient is [name], MD  LOS - 4  Outpatient Specialists: Oncology in [facility] Complaint  Patient presents with  . Migraine       Brief Narrative  75 year old male with history of A. fib on full dose aspirin, CLL with WBC in 140s (onseeing Dr Talbert Cage and is on Imbruvica for Binet Stage C CLL), fall at home about 4-5 months back and hit his head without loss of consciousness presented to [facility] ED with migraine like frontal headache for past one and half week. His oncologist had ordered an MRI of the brain to be done yesterday. For the past 2-3 days he also noticed his hands and feet becoming somewhat weak and had some unsteadiness while walking. He had trouble buttoning his shirt. Reports symptoms primarily affecting some skilled motor function rather than his strength. Denies any tingling or numbness, recent fall or loss of consciousness. In the ED CT of the head showed right frontoparietal subdural hematoma measuring up to 14 mm in thickness with 3 mm midline shift to the left. Neurosurgery consulted and patient transferred to [facility] for further management.   Subjective: Denies any headache. Left hand weakness continues to improve.   Assessment  & Plan :    Principal Problem:   Acute/subacute Subdural hematoma (HCC) -Associated mild midline shift and discussed her fine motor skill in the left hand. -Status post right frontoparietal craniotomy with evacuation of hematoma on 5/19. Stable  postop. -Cannot use anticoagulation or antiplatelet agent for the next 2 months. Aspirin discontinued. -irutinib also discontinued since it is associated with it including GI intracerebral hemorrhage (up to 30% incidence). Will discuss with his oncologist in [place]. -When necessary oxycodone for pain -Keppra for seizure prophylaxis.  Patient ambulating in the hallway without difficulty. Clear drain.  CT head repeated this morning shows improvement in the size of hematoma and no  further midline shift.  -Appreciate cardiology consult for preoperative clearance. Continue perioperative beta blocker. Recommend long-term anticoagulation and possibly after 2 months. Another option is placement of a watchman device. 2-D echo with normal EF and no wall motion abnormality.    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  held due to high risk for causing hemorrhage including postprocedure bleeding. Was unable to use his oncologist in [place].    Atrial fibrillation (HCC) Aspirin discontinued. Remains in sinus rhythm. Continue Coreg.  Essential hypertension Continue Coreg, amlodipine and Avapro. Held HCTZ due to hyponatremia. Resume upon discharge.  Hypokalemia Replenished.  History of gout Continue allopurinol    Code Status : Full code  Family Communication  : Wife and daughters at bedside  Disposition Plan  : Pending hospital course  Barriers For Discharge :  Improving symptoms  Consults  :  Cardiology neurosurgery  Procedures  :  CT head MRI brain Craniotomy with evaluation of SDH  DVT Prophylaxis  :  SCDs  Lab Results  Component Value Date   PLT 184 10/15/2016    Antibiotics  :   Anti-infectives    Start     Dose/Rate Route Frequency Ordered Stop   10/14/16 0857  bacitracin 50,000 Units in sodium chloride irrigation 0.9 % 500 mL irrigation  Status:  Discontinued       As needed 10/14/16 0858  10/14/16 1105   10/14/16 0800  vancomycin (VANCOCIN) 1,500 mg in sodium chloride 0.9 % 500 mL IVPB     1,500 mg 250 mL/hr over 120 Minutes Intravenous  Once 10/14/16 0745 10/14/16 1020   10/13/16 0815  cefTRIAXone (ROCEPHIN) 2 g in dextrose 5 % 50 mL IVPB    Comments:  Send with patient to OR on 5/19 a.m. Do not administer on floor, to be given by anesthesia preoperatively in OR.   2 g 100 mL/hr over 30 Minutes Intravenous To Surgery 10/13/16 0808 10/14/16 0815   10/12/16 1000  acyclovir (ZOVIRAX) tablet 400 mg     400 mg Oral 2 times daily 10/12/16 0350          Objective:   Vitals:   10/16/16 0700 10/16/16 0800 10/16/16 0900 10/16/16 1000  BP: 123/79 (!) 132/97 133/74 108/66  Pulse: 64 73 72 77  Resp: (!) 22 (!) 32 14 20  Temp:      TempSrc:      SpO2: 96% 95% 95% 95%  Weight:      Height:        Wt Readings from Last 3 Encounters:  10/11/16 104.3 kg (230 lb)  10/05/16 103.4 kg (228 lb)  09/04/16 102.2 kg (225 lb 6.4 oz)     Intake/Output Summary (Last 24 hours) at 10/16/16 1137 Last data filed at 10/16/16 1000  Gross per 24 hour  Intake              451 ml  Output             2135 ml  Net            -1684 ml     Physical Exam  Gen.: Not in distress HEENT: Clean staples over right parietal scalp with clear drain, moist mucosa Chest: Clear bilaterally  CVS: S1 and S2 irregular, no murmurs GI: Soft, nondistended, nontender Musculoskeletal: Warm, no edema CNS: Alert and oriented, improving left pronator drift     Data Review:    CBC  Recent Labs Lab 10/11/16  2326 10/13/16 0436 10/15/16 0243  WBC 141.3* 142.4* 133.3*  HGB 14.2 14.2 12.8*  HCT 43.0 43.8 40.2  PLT 181 179 184  MCV 94.7 94.0 94.1  MCH 31.3 30.5 30.0  MCHC 33.0 32.4 31.8  RDW 12.6 12.5 12.8  LYMPHSABS 130.0* 132.5* 117.3*  MONOABS 0.0* 1.4* 2.7*  EOSABS 0.0 0.0 0.0  BASOSABS 0.0 0.0 0.0    Chemistries   Recent Labs Lab 10/11/16 2326 10/13/16 0436 10/14/16 0300  10/15/16 0243 10/16/16 0244  NA 138 131* 130* 132* 137  K 3.2* 4.5 3.6 5.1 4.6  CL 103 97* 93* 101 107  CO2 25 28 28 24 24   GLUCOSE 203* 135* 117* 148* 111*  BUN 18 15 17 18  24*  CREATININE 1.15 1.08 1.02 1.03 0.90  CALCIUM 9.4 9.5 9.6 8.5* 8.4*   ------------------------------------------------------------------------------------------------------------------ No results for input(s): CHOL, HDL, LDLCALC, TRIG, CHOLHDL, LDLDIRECT in the last 72 hours.  No results found for: HGBA1C ------------------------------------------------------------------------------------------------------------------ No results for input(s): TSH, T4TOTAL, T3FREE, THYROIDAB in the last 72 hours.  Invalid input(s): FREET3 ------------------------------------------------------------------------------------------------------------------ No results for input(s): VITAMINB12, FOLATE, FERRITIN, TIBC, IRON, RETICCTPCT in the last 72 hours.  Coagulation profile  Recent Labs Lab 10/14/16 1133  INR 1.07    No results for input(s): DDIMER in the last 72 hours.  Cardiac Enzymes No results for input(s): CKMB, TROPONINI, MYOGLOBIN in the last 168 hours.  Invalid input(s): CK ------------------------------------------------------------------------------------------------------------------ No results found for: BNP  Inpatient Medications  Scheduled Meds: . acyclovir  400 mg Oral BID  . allopurinol  300 mg Oral BID  . amLODipine  10 mg Oral Daily  . carvedilol  12.5 mg Oral BID WC  . Chlorhexidine Gluconate Cloth  6 each Topical Q0600  . irbesartan  300 mg Oral Daily  . multivitamin with minerals  1 tablet Oral Daily  . mupirocin ointment  1 application Nasal BID  . polyethylene glycol  17 g Oral Daily  . sodium chloride flush  3 mL Intravenous Q12H   Continuous Infusions: . levETIRAcetam 500 mg (10/16/16 1101)   PRN Meds:.acetaminophen **OR** acetaminophen, hydrALAZINE, HYDROcodone-acetaminophen,  labetalol, morphine injection, oxyCODONE-acetaminophen  Micro Results Recent Results (from the past 240 hour(s))  MRSA PCR Screening     Status: Abnormal   Collection Time: 10/12/16  3:47 AM  Result Value Ref Range Status   MRSA by PCR POSITIVE (A) NEGATIVE Final    Comment:        The GeneXpert MRSA Assay (FDA approved for NASAL specimens only), is one component of a comprehensive MRSA colonization surveillance program. It is not intended to diagnose MRSA infection nor to guide or monitor treatment for MRSA infections. RESULT CALLED TO, READ BACK BY AND VERIFIED WITH: A. Netta Corrigan RN 10:05 10/12/16 ([email or web address])     Radiology Reports Ct Head Wo Contrast  Result Date: 10/16/2016 CLINICAL DATA:  Intracranial hemorrhage EXAM: CT HEAD WITHOUT CONTRAST TECHNIQUE: Contiguous axial images were obtained from the base of the skull through the vertex without intravenous contrast. COMPARISON:  Brain MRI 5178 FINDINGS: Brain: There is a right parietal subdural drainage catheter. The right convexity subdural hematoma has decreased in size, now measuring approximately 7 mm in thickness, previously a.m. up to 19 mm. Midline shift has resolved. No hydrocephalus. No new area of hemorrhage. There is a small amount of pneumocephalus along the right convexity without mass effect. No evidence acute cortical infarct. Vascular: No hyperdense vessel or unexpected calcification. Skull: Right parietal craniotomy. Sinuses/Orbits: No acute finding. Other: None. IMPRESSION: 1.  Decreased size of right subdural hematoma following placement of subdural drainage catheter, with resolution of midline shift. 2. No new hemorrhage, mass effect or hydrocephalus. Electronically Signed   By: Ulyses Jarred M.D.   On: 10/16/2016 04:43   Ct Head Wo Contrast  Result Date: 10/12/2016 CLINICAL DATA:  Headache and incoordination. EXAM: CT HEAD WITHOUT CONTRAST TECHNIQUE: Contiguous axial images were obtained from the base of the skull  through the vertex without intravenous contrast. COMPARISON:  None. FINDINGS: Brain: A 1.4 cm thick right-sided subdural hematoma overlying the right parietal lobe with mixed attenuation suggesting a recent bleed is noted with approximately 3 mm of right to left midline shift. No hydrocephalus is identified. The basal cisterns and fourth ventricle are midline. No intraparenchymal hemorrhage is identified. No large vascular territory infarction is noted. Vascular: No hyperdense vessel or unexpected calcification. Skull: Normal. Negative for fracture or focal lesion. Sinuses/Orbits: No acute finding. Mild ethmoid sinus mucosal thickening. Intact orbits and globes. Mastoids are clear. Other: None IMPRESSION: Right-sided subdural hematoma overlying the parietal convexity measuring up to 1.4 cm in thickness with 3 mm of right to left midline shift. Given mixed attenuation within, findings likely represent a recent event. Critical Value/emergent results were called by telephone at the time of interpretation on 10/12/2016 at 12:08 am to Dr. Delora Fuel , who verbally acknowledged these results. Electronically Signed   By: Ashley Royalty M.D.   On: 10/12/2016 00:09   Mr Brain Wo Contrast  Result Date: 10/12/2016 CLINICAL DATA:  Subdural hematoma. Right arm weakness. History of CLL. EXAM: MRI HEAD WITHOUT CONTRAST TECHNIQUE: Multiplanar, multiecho pulse sequences of the brain and surrounding structures were obtained without intravenous contrast. COMPARISON:  CT head 10/11/2016 FINDINGS: Brain: Right-sided subdural hematoma has mixed signal intensity with serum and layering and blood products. This appears to be subacute. Subdural blood in the right parietal lobe measuring up to 14 mm in thickness unchanged from the prior CT. Small amount of subdural blood is present in the middle cranial fossa lateral and inferior to the temporal lobe. Methemoglobin is present within the subdural hematoma compatible with subacute blood.  Small amount of subarachnoid hemorrhage is seen with hyperintense signal in the sulci on FLAIR imaging. Ventricle size normal. Mild midline shift to the left of 3 mm is unchanged. Negative for acute infarct. No significant chronic ischemic change. No mass lesion. Vascular: Normal arterial flow voids. Skull and upper cervical spine: Negative Sinuses/Orbits: Mild mucosal edema in the paranasal sinuses. Bilateral cataract removal. Other: None IMPRESSION: Subacute right-sided subdural hematoma measuring up to 14 mm in thickness in the right parietal lobe with small amount of blood in the right middle cranial fossa. 3 mm midline shift to the left is unchanged. Small amount of subarachnoid hemorrhage Negative for acute infarct. Electronically Signed   By: Franchot Gallo M.D.   On: 10/12/2016 07:53    Time Spent in minutes  25   Louellen Molder M.D on 10/16/2016 at 11:37 AM  Between 7am to 7pm - Pager - [phone]  After 7pm go to [email or web address] - password [facility]  [facility] -  Office  [phone]

## 2016-10-16 NOTE — Progress Notes (Signed)
Subjective: Patient sitting up, finishing lunch. Has had no nausea or vomiting since surgery (had both prior to surgery). Minimal discomfort. Mildly bloody drainage into the Jackson-Pratt drain. CT this morning shows small amount of recurrent subdural collection, without significant mass effect. Overall CT is much improved. Has been ambulating in the ICU with the staff.  Objective: Vital signs in last 24 hours: Vitals:   10/16/16 0900 10/16/16 1000 10/16/16 1100 10/16/16 1200  BP: 133/74 108/66 107/71 94/61  Pulse: 72 77 77 62  Resp: 14 20 (!) 23 15  Temp:    97.7 F (36.5 C)  TempSrc:      SpO2: 95% 95% 99% 93%  Weight:      Height:        Intake/Output from previous day: 05/20 0701 - 05/21 0700 In: 758 [P.O.:360; I.V.:188; IV Piggyback:210] Out: 1810 [Urine:1725; Drains:85] Intake/Output this shift: Total I/O In: 328 [P.O.:220; I.V.:3; IV Piggyback:105] Out: 620 [Urine:600; Drains:20]  Physical Exam:  Awake and alert, oriented. Speech fluent. Following commands. Mild pronation of left upper extremity (much improved as compared to prior to surgery).  CBC  Recent Labs  10/15/16 0243  WBC 133.3*  HGB 12.8*  HCT 40.2  PLT 184   BMET  Recent Labs  10/15/16 0243 10/16/16 0244  NA 132* 137  K 5.1 4.6  CL 101 107  CO2 24 24  GLUCOSE 148* 111*  BUN 18 24*  CREATININE 1.03 0.90  CALCIUM 8.5* 8.4*    Studies/Results: Ct Head Wo Contrast  Result Date: 10/16/2016 CLINICAL DATA:  Intracranial hemorrhage EXAM: CT HEAD WITHOUT CONTRAST TECHNIQUE: Contiguous axial images were obtained from the base of the skull through the vertex without intravenous contrast. COMPARISON:  Brain MRI 5178 FINDINGS: Brain: There is a right parietal subdural drainage catheter. The right convexity subdural hematoma has decreased in size, now measuring approximately 7 mm in thickness, previously a.m. up to 19 mm. Midline shift has resolved. No hydrocephalus. No new area of hemorrhage. There is a  small amount of pneumocephalus along the right convexity without mass effect. No evidence acute cortical infarct. Vascular: No hyperdense vessel or unexpected calcification. Skull: Right parietal craniotomy. Sinuses/Orbits: No acute finding. Other: None. IMPRESSION: 1. Decreased size of right subdural hematoma following placement of subdural drainage catheter, with resolution of midline shift. 2. No new hemorrhage, mass effect or hydrocephalus. Electronically Signed   By: Ulyses Jarred M.D.   On: 10/16/2016 04:43    Assessment/Plan: Doing well following craniotomy for evacuation of subdural hematoma. Electrolytes improved with now normal sodium and potassium. Removed Jackson-Pratt drain. If patient remains stable overnight, will be OK to move out of ICU. Spoke with the patient, his wife, and his daughter. Their questions were answered for them.   Hosie Spangle, MD 10/16/2016, 1:23 PM `

## 2016-10-16 NOTE — Progress Notes (Addendum)
Patient bradycardic while sleeping, 40s-50s, lowest 38 nonsustained. MD text paged to make aware. Patient asymptomatic, arouses easily with no c/o dizziness or chest discomfort. Will continue to monitor. No orders received.

## 2016-10-16 NOTE — Care Management Note (Signed)
Case Management Note  Patient Details  Name: Andrew [name] MRN: [id number] Date of Birth: 07/23/1940  Subjective/Objective:   Pt admitted on 10/11/16 with SDH requiring craniotomy for evacuation on 10/14/16.  PTA, pt independent, lives at home with spouse.                   Action/Plan: Will follow for discharge planning as pt progresses.   Expected Discharge Date:                  Expected Discharge Plan:  Home/Self Care  In-House Referral:     Discharge planning Services  CM Consult  Post Acute Care Choice:    Choice offered to:     DME Arranged:    DME Agency:     HH Arranged:    HH Agency:     Status of Service:  In process, will continue to follow  If discussed at Long Length of Stay Meetings, dates discussed:    Additional Comments:  Reinaldo Raddle, RN, BSN  Trauma/Neuro ICU Case Manager [phone]

## 2016-10-17 DIAGNOSIS — R29898 Other symptoms and signs involving the musculoskeletal system: Secondary | ICD-10-CM

## 2016-10-17 DIAGNOSIS — I482 Chronic atrial fibrillation: Secondary | ICD-10-CM

## 2016-10-17 MED ORDER — HYDROCHLOROTHIAZIDE 25 MG PO TABS
25.0000 mg | ORAL_TABLET | Freq: Every day | ORAL | Status: DC
  Administered 2016-10-17 – 2016-10-18 (×2): 25 mg via ORAL
  Filled 2016-10-17 (×2): qty 1

## 2016-10-17 MED ORDER — IRBESARTAN 300 MG PO TABS
300.0000 mg | ORAL_TABLET | Freq: Every day | ORAL | Status: DC
  Administered 2016-10-17 – 2016-10-18 (×2): 300 mg via ORAL
  Filled 2016-10-17: qty 2
  Filled 2016-10-17: qty 1

## 2016-10-17 MED ORDER — CARVEDILOL 6.25 MG PO TABS
6.2500 mg | ORAL_TABLET | Freq: Two times a day (BID) | ORAL | Status: DC
  Administered 2016-10-17 – 2016-10-18 (×2): 6.25 mg via ORAL
  Filled 2016-10-17 (×2): qty 1

## 2016-10-17 MED ORDER — TELMISARTAN-HCTZ 80-25 MG PO TABS: 1.0000 | ORAL_TABLET | Freq: Every day | ORAL | Status: DC

## 2016-10-17 NOTE — Progress Notes (Signed)
PROGRESS NOTE                                                                                                                                                                                                             Patient Demographics:    Andrew [name], is a 76 y.o. male, DOB - May 28, 1941, BZJ:[id number]  Admit date - 10/11/2016   Admitting Physician Etta Quill, DO  Outpatient Primary MD for the patient is [name], MD  LOS - 5  Outpatient Specialists: Oncology in [facility] Complaint  Patient presents with  . Migraine       Brief Narrative  76 year old male with history of A. fib on full dose aspirin, CLL with WBC in 140s (onseeing Dr Talbert Cage and is on Imbruvica for Binet Stage C CLL), fall at home about 4-5 months back and hit his head without loss of consciousness presented to [facility] ED with migraine like frontal headache for past one and half week. His oncologist had ordered an MRI of the brain to be done yesterday. For the past 2-3 days he also noticed his hands and feet becoming somewhat weak and had some unsteadiness while walking. He had trouble buttoning his shirt. Reports symptoms primarily affecting some skilled motor function rather than his strength. Denies any tingling or numbness, recent fall or loss of consciousness. In the ED CT of the head showed right frontoparietal subdural hematoma measuring up to 14 mm in thickness with 3 mm midline shift to the left. Neurosurgery consulted and patient transferred to [facility] for further management.   Subjective: continues to feels better. Left hand weakness improving.   Assessment  & Plan :    Principal Problem:   Acute/subacute Subdural hematoma (HCC) -Associated mild midline shift and Impairment of fine motor skill in the left hand. -Underwent right frontoparietal craniotomy with evacuation of hematoma on 5/19. Stable postop. -Cannot use  anticoagulation or antiplatelet for next 2 months. Aspirin has been discontinued. -Protein also discontinued due to its association with high risk of bleeding (including GI and intracerebral hemorrhage with up to 30% incidence). -Discussed with his oncologist, ok to hold it for now. -Cannot use anticoagulation or antiplatelet agent for the next 2 months. Aspirin discontinued.  oncologist and-When necessary oxycodone for pain -Keppra for seizure prophylaxis. -Repeat head CT on 5/21  shows improvement in the size of hematoma and resolution of the midline shift.  -JP drain was removed on 5/21. Patient ambulating in the hallway without difficulty.   Aspirin to telemetry. Possible discharge home tomorrow.    -Appreciate cardiology consult for preoperative clearance. Continue perioperative beta blocker. Recommend long-term anticoagulation and possibly after 2 months. Another option is placement of a watchman device. Follow-up with cardiology as outpatient. 2-D echo with normal EF and no wall motion abnormality.    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  held due to high risk for causing hemorrhage including postprocedure bleeding. Was unable to use his oncologist in [place].    Atrial fibrillation (HCC) Aspirin discontinued. Remains in sinus rhythm. Coreg dose reduced due to bradycardia.  Sinus bradycardia  Episodes of heart rate in the 40s. Reduce coreg dose by 50%. On it on telemetry.  Essential hypertension Continue amlodipine, Avapro and HCTZ.  Hypokalemia Replenished.  History of gout Continue allopurinol    Code Status : Full code  Family Communication  : Wife  at bedside  Disposition Plan  : Transfer to telemetry. Home possibly tomorrow if cleared by neurosurgery  Barriers For Discharge :  Improving symptoms  Consults  :  Cardiology neurosurgery (Dr. Sherwood Gambler)  Procedures  :  CT head MRI  brain Craniotomy with evaluation of SDH  DVT Prophylaxis  :  SCDs  Lab Results  Component Value Date   PLT 184 10/15/2016    Antibiotics  :   Anti-infectives    Start     Dose/Rate Route Frequency Ordered Stop   10/14/16 0857  bacitracin 50,000 Units in sodium chloride irrigation 0.9 % 500 mL irrigation  Status:  Discontinued       As needed 10/14/16 0858 10/14/16 1105   10/14/16 0800  vancomycin (VANCOCIN) 1,500 mg in sodium chloride 0.9 % 500 mL IVPB     1,500 mg 250 mL/hr over 120 Minutes Intravenous  Once 10/14/16 0745 10/14/16 1020   10/13/16 0815  cefTRIAXone (ROCEPHIN) 2 g in dextrose 5 % 50 mL IVPB    Comments:  Send with patient to OR on 5/19 a.m. Do not administer on floor, to be given by anesthesia preoperatively in OR.   2 g 100 mL/hr over 30 Minutes Intravenous To Surgery 10/13/16 0808 10/14/16 0815   10/12/16 1000  acyclovir (ZOVIRAX) tablet 400 mg     400 mg Oral 2 times daily 10/12/16 0350          Objective:   Vitals:   10/17/16 0500 10/17/16 0600 10/17/16 0700 10/17/16 0800  BP: 121/81 138/89 (!) 118/94 (!) 134/93  Pulse: (!) 57 78 71 69  Resp: 15 20 14 15   Temp:    97.8 F (36.6 C)  TempSrc:    Oral  SpO2: 93% 95% 96% 97%  Weight:      Height:        Wt Readings from Last 3 Encounters:  10/11/16 104.3 kg (230 lb)  10/05/16 103.4 kg (228 lb)  09/04/16 102.2 kg (225 lb 6.4 oz)     Intake/Output Summary (Last 24 hours) at 10/17/16 0949 Last data filed at 10/17/16 0730  Gross per 24 hour  Intake              448 ml  Output             3150 ml  Net            -  2702 ml     Physical Exam Gen.: Not in distress HEENT: Clean staples right parietal scalp Chest: Clear to auscultation CVS: Normal S1 and S2, no murmurs GI: Soft, nondistended, nontender Musculoskeletal: Warm, no edema CNS: Improving left pronator drift      Data Review:    CBC  Recent Labs Lab 10/11/16 2326 10/13/16 0436 10/15/16 0243  WBC 141.3* 142.4* 133.3*  HGB  14.2 14.2 12.8*  HCT 43.0 43.8 40.2  PLT 181 179 184  MCV 94.7 94.0 94.1  MCH 31.3 30.5 30.0  MCHC 33.0 32.4 31.8  RDW 12.6 12.5 12.8  LYMPHSABS 130.0* 132.5* 117.3*  MONOABS 0.0* 1.4* 2.7*  EOSABS 0.0 0.0 0.0  BASOSABS 0.0 0.0 0.0    Chemistries   Recent Labs Lab 10/11/16 2326 10/13/16 0436 10/14/16 0300 10/15/16 0243 10/16/16 0244  NA 138 131* 130* 132* 137  K 3.2* 4.5 3.6 5.1 4.6  CL 103 97* 93* 101 107  CO2 25 28 28 24 24   GLUCOSE 203* 135* 117* 148* 111*  BUN 18 15 17 18  24*  CREATININE 1.15 1.08 1.02 1.03 0.90  CALCIUM 9.4 9.5 9.6 8.5* 8.4*   ------------------------------------------------------------------------------------------------------------------ No results for input(s): CHOL, HDL, LDLCALC, TRIG, CHOLHDL, LDLDIRECT in the last 72 hours.  No results found for: HGBA1C ------------------------------------------------------------------------------------------------------------------ No results for input(s): TSH, T4TOTAL, T3FREE, THYROIDAB in the last 72 hours.  Invalid input(s): FREET3 ------------------------------------------------------------------------------------------------------------------ No results for input(s): VITAMINB12, FOLATE, FERRITIN, TIBC, IRON, RETICCTPCT in the last 72 hours.  Coagulation profile  Recent Labs Lab 10/14/16 1133  INR 1.07    No results for input(s): DDIMER in the last 72 hours.  Cardiac Enzymes No results for input(s): CKMB, TROPONINI, MYOGLOBIN in the last 168 hours.  Invalid input(s): CK ------------------------------------------------------------------------------------------------------------------ No results found for: BNP  Inpatient Medications  Scheduled Meds: . acyclovir  400 mg Oral BID  . allopurinol  300 mg Oral BID  . amLODipine  10 mg Oral Daily  . carvedilol  6.25 mg Oral BID WC  . irbesartan  300 mg Oral Daily   And  . hydrochlorothiazide  25 mg Oral Daily  . levETIRAcetam  500 mg Oral  BID  . multivitamin with minerals  1 tablet Oral Daily  . mupirocin ointment  1 application Nasal BID  . polyethylene glycol  17 g Oral Daily  . sodium chloride flush  3 mL Intravenous Q12H   Continuous Infusions:  PRN Meds:.acetaminophen **OR** acetaminophen, HYDROcodone-acetaminophen  Micro Results Recent Results (from the past 240 hour(s))  MRSA PCR Screening     Status: Abnormal   Collection Time: 10/12/16  3:47 AM  Result Value Ref Range Status   MRSA by PCR POSITIVE (A) NEGATIVE Final    Comment:        The GeneXpert MRSA Assay (FDA approved for NASAL specimens only), is one component of a comprehensive MRSA colonization surveillance program. It is not intended to diagnose MRSA infection nor to guide or monitor treatment for MRSA infections. RESULT CALLED TO, READ BACK BY AND VERIFIED WITH: A. Netta Corrigan RN 10:05 10/12/16 ([email or web address])     Radiology Reports Ct Head Wo Contrast  Result Date: 10/16/2016 CLINICAL DATA:  Intracranial hemorrhage EXAM: CT HEAD WITHOUT CONTRAST TECHNIQUE: Contiguous axial images were obtained from the base of the skull through the vertex without intravenous contrast. COMPARISON:  Brain MRI 5178 FINDINGS: Brain: There is a right parietal subdural drainage catheter. The right convexity subdural hematoma has decreased in size, now measuring approximately 7 mm in thickness,  previously a.m. up to 19 mm. Midline shift has resolved. No hydrocephalus. No new area of hemorrhage. There is a small amount of pneumocephalus along the right convexity without mass effect. No evidence acute cortical infarct. Vascular: No hyperdense vessel or unexpected calcification. Skull: Right parietal craniotomy. Sinuses/Orbits: No acute finding. Other: None. IMPRESSION: 1. Decreased size of right subdural hematoma following placement of subdural drainage catheter, with resolution of midline shift. 2. No new hemorrhage, mass effect or hydrocephalus. Electronically Signed   By: Ulyses Jarred M.D.   On: 10/16/2016 04:43   Ct Head Wo Contrast  Result Date: 10/12/2016 CLINICAL DATA:  Headache and incoordination. EXAM: CT HEAD WITHOUT CONTRAST TECHNIQUE: Contiguous axial images were obtained from the base of the skull through the vertex without intravenous contrast. COMPARISON:  None. FINDINGS: Brain: A 1.4 cm thick right-sided subdural hematoma overlying the right parietal lobe with mixed attenuation suggesting a recent bleed is noted with approximately 3 mm of right to left midline shift. No hydrocephalus is identified. The basal cisterns and fourth ventricle are midline. No intraparenchymal hemorrhage is identified. No large vascular territory infarction is noted. Vascular: No hyperdense vessel or unexpected calcification. Skull: Normal. Negative for fracture or focal lesion. Sinuses/Orbits: No acute finding. Mild ethmoid sinus mucosal thickening. Intact orbits and globes. Mastoids are clear. Other: None IMPRESSION: Right-sided subdural hematoma overlying the parietal convexity measuring up to 1.4 cm in thickness with 3 mm of right to left midline shift. Given mixed attenuation within, findings likely represent a recent event. Critical Value/emergent results were called by telephone at the time of interpretation on 10/12/2016 at 12:08 am to Dr. Delora Fuel , who verbally acknowledged these results. Electronically Signed   By: Ashley Royalty M.D.   On: 10/12/2016 00:09   Mr Brain Wo Contrast  Result Date: 10/12/2016 CLINICAL DATA:  Subdural hematoma. Right arm weakness. History of CLL. EXAM: MRI HEAD WITHOUT CONTRAST TECHNIQUE: Multiplanar, multiecho pulse sequences of the brain and surrounding structures were obtained without intravenous contrast. COMPARISON:  CT head 10/11/2016 FINDINGS: Brain: Right-sided subdural hematoma has mixed signal intensity with serum and layering and blood products. This appears to be subacute. Subdural blood in the right parietal lobe measuring up to 14 mm in  thickness unchanged from the prior CT. Small amount of subdural blood is present in the middle cranial fossa lateral and inferior to the temporal lobe. Methemoglobin is present within the subdural hematoma compatible with subacute blood. Small amount of subarachnoid hemorrhage is seen with hyperintense signal in the sulci on FLAIR imaging. Ventricle size normal. Mild midline shift to the left of 3 mm is unchanged. Negative for acute infarct. No significant chronic ischemic change. No mass lesion. Vascular: Normal arterial flow voids. Skull and upper cervical spine: Negative Sinuses/Orbits: Mild mucosal edema in the paranasal sinuses. Bilateral cataract removal. Other: None IMPRESSION: Subacute right-sided subdural hematoma measuring up to 14 mm in thickness in the right parietal lobe with small amount of blood in the right middle cranial fossa. 3 mm midline shift to the left is unchanged. Small amount of subarachnoid hemorrhage Negative for acute infarct. Electronically Signed   By: Franchot Gallo M.D.   On: 10/12/2016 07:53    Time Spent in minutes  25   Louellen Molder M.D on 10/17/2016 at 9:49 AM  Between 7am to 7pm - Pager - [phone]  After 7pm go to [email or web address] - password [facility]  [facility] -  Office  [phone]

## 2016-10-17 NOTE — Care Management Note (Signed)
Case Management Note  Patient Details  Name: Andrew [name] MRN: [id number] Date of Birth: 1940-08-02  Subjective/Objective:   Pt admitted on 10/11/16 with SDH requiring craniotomy for evacuation on 10/14/16.  PTA, pt independent, lives at home with spouse.                   Action/Plan: Will follow for discharge planning as pt progresses.   Expected Discharge Date:                  Expected Discharge Plan:  Home/Self Care  In-House Referral:     Discharge planning Services  CM Consult  Post Acute Care Choice:    Choice offered to:     DME Arranged:    DME Agency:     HH Arranged:    HH Agency:     Status of Service:  In process, will continue to follow  If discussed at Long Length of Stay Meetings, dates discussed:    Additional Comments:  10/17/16 J. Lora Glomski, RN, BSN Pt transferring to regular floor today; ambulating well without assistive device.  Wife to provide 24h care at dc.  No dc needs identified.    Reinaldo Raddle, RN, BSN  Trauma/Neuro ICU Case Manager [phone]

## 2016-10-17 NOTE — Progress Notes (Signed)
Patient ambulated hall with RN, gait steady, patient states he is at his baseline. Tolerated well.

## 2016-10-17 NOTE — Progress Notes (Signed)
Patient admitted to 5M07. Patient is alert, oriented x4, incision is dry and intact, no drainage on dressing, patient and family oriented to room, unit, staff, plan of care. Tele set up, ccmd notified. Will continue to monitor

## 2016-10-17 NOTE — Progress Notes (Signed)
Subjective: Patient sitting up in chair, with wife and daughter at bedside. Denies new complaints. Pain well controlled with hydrocodone. Dressing over drain site changed by nursing staff because of drainage.  Up and ambulating in the halls with a staff.  Objective: Vital signs in last 24 hours: Vitals:   10/17/16 0500 10/17/16 0600 10/17/16 0700 10/17/16 0800  BP: 121/81 138/89 (!) 118/94 (!) 134/93  Pulse: (!) 57 78 71 69  Resp: 15 20 14 15   Temp:      TempSrc:      SpO2: 93% 95% 96% 97%  Weight:      Height:        Intake/Output from previous day: 05/21 0701 - 05/22 0700 In: 451 [P.O.:340; I.V.:6; IV Piggyback:105] Out: 2570 [Urine:2550; Drains:20] Intake/Output this shift: Total I/O In: -  Out: 600 [Urine:600]  Physical Exam:  Awake and alert, fully oriented. Following commands. Speech fluent. Mild left pronator drift, unchanged from yesterday.   CBC  Recent Labs  10/15/16 0243  WBC 133.3*  HGB 12.8*  HCT 40.2  PLT 184   BMET  Recent Labs  10/15/16 0243 10/16/16 0244  NA 132* 137  K 5.1 4.6  CL 101 107  CO2 24 24  GLUCOSE 148* 111*  BUN 18 24*  CREATININE 1.03 0.90  CALCIUM 8.5* 8.4*    Studies/Results: Ct Head Wo Contrast  Result Date: 10/16/2016 CLINICAL DATA:  Intracranial hemorrhage EXAM: CT HEAD WITHOUT CONTRAST TECHNIQUE: Contiguous axial images were obtained from the base of the skull through the vertex without intravenous contrast. COMPARISON:  Brain MRI 5178 FINDINGS: Brain: There is a right parietal subdural drainage catheter. The right convexity subdural hematoma has decreased in size, now measuring approximately 7 mm in thickness, previously a.m. up to 19 mm. Midline shift has resolved. No hydrocephalus. No new area of hemorrhage. There is a small amount of pneumocephalus along the right convexity without mass effect. No evidence acute cortical infarct. Vascular: No hyperdense vessel or unexpected calcification. Skull: Right parietal  craniotomy. Sinuses/Orbits: No acute finding. Other: None. IMPRESSION: 1. Decreased size of right subdural hematoma following placement of subdural drainage catheter, with resolution of midline shift. 2. No new hemorrhage, mass effect or hydrocephalus. Electronically Signed   By: Ulyses Jarred M.D.   On: 10/16/2016 04:43    Assessment/Plan: Patient continued to make good progress following craniotomy 3 days ago. Dressing over drain site changed. Craniotomy incision left open to air. Encouraged to ambulate. Okay to transfer to a MedSurg unit or telemetry unit (if needed from medical/cardiology perspective; not needed from a neurosurgical perspective).   Hosie Spangle, MD 10/17/2016, 8:47 AM

## 2016-10-18 ENCOUNTER — Inpatient Hospital Stay (HOSPITAL_COMMUNITY): Payer: Medicare HMO

## 2016-10-18 DIAGNOSIS — C9190 Lymphoid leukemia, unspecified not having achieved remission: Secondary | ICD-10-CM

## 2016-10-18 DIAGNOSIS — I481 Persistent atrial fibrillation: Secondary | ICD-10-CM

## 2016-10-18 LAB — CBC
HEMATOCRIT: 41.2 % (ref 39.0–52.0)
HEMOGLOBIN: 13.3 g/dL (ref 13.0–17.0)
MCH: 30.2 pg (ref 26.0–34.0)
MCHC: 32.3 g/dL (ref 30.0–36.0)
MCV: 93.6 fL (ref 78.0–100.0)
Platelets: 198 10*3/uL (ref 150–400)
RBC: 4.4 MIL/uL (ref 4.22–5.81)
RDW: 12.9 % (ref 11.5–15.5)
WBC: 100.4 10*3/uL — AB (ref 4.0–10.5)

## 2016-10-18 MED ORDER — HYDROCODONE-ACETAMINOPHEN 5-325 MG PO TABS: 1.0000 | ORAL_TABLET | ORAL | 0 refills | Status: DC | PRN

## 2016-10-18 MED ORDER — LEVETIRACETAM ER 500 MG PO TB24: 500.0000 mg | ORAL_TABLET | Freq: Two times a day (BID) | ORAL | 1 refills | Status: DC

## 2016-10-18 MED ORDER — LEVETIRACETAM 500 MG PO TABS: 500.0000 mg | ORAL_TABLET | Freq: Two times a day (BID) | ORAL | 2 refills | Status: DC

## 2016-10-18 MED ORDER — POLYETHYLENE GLYCOL 3350 17 G PO PACK: 17.0000 g | PACK | Freq: Every day | ORAL | 0 refills | Status: DC

## 2016-10-18 MED ORDER — LEVETIRACETAM 500 MG PO TABS: 500.0000 mg | ORAL_TABLET | Freq: Two times a day (BID) | ORAL | 3 refills | Status: DC

## 2016-10-18 MED ORDER — IBRUTINIB 420 MG PO TABS: 1.0000 | ORAL_TABLET | Freq: Every day | ORAL | 0 refills | Status: DC

## 2016-10-18 MED ORDER — ACETAMINOPHEN 325 MG PO TABS: 650.0000 mg | ORAL_TABLET | Freq: Four times a day (QID) | ORAL | 1 refills | Status: DC | PRN

## 2016-10-18 NOTE — Care Management Note (Signed)
Case Management Note  Patient Details  Name: Andrew [name] MRN: [id number] Date of Birth: 1940/09/27  Subjective/Objective:                    Action/Plan: Pt discharging home with self care. No f/u per PT/OT and no DME needs. Pt has insurance and PCP. No further needs per CM.  Expected Discharge Date:  10/18/16               Expected Discharge Plan:  Home/Self Care  In-House Referral:     Discharge planning Services  CM Consult  Post Acute Care Choice:    Choice offered to:     DME Arranged:    DME Agency:     HH Arranged:    HH Agency:     Status of Service:  Completed, signed off  If discussed at [employer] of Stay Meetings, dates discussed:    Additional Comments:  Pollie Friar, RN 10/18/2016, 1:40 PM

## 2016-10-18 NOTE — Discharge Summary (Signed)
Physician Discharge Summary  DURWARD MATRANGA MRN: [id number] DOB/AGE: 77-Jan-1942 77 y.o.  PCP: [name], MD   Admit date: 10/11/2016 Discharge date: 10/18/2016  Discharge Diagnoses:    Principal Problem:   Subdural hematoma ([place]) Active Problems:   CLL (chronic lymphocytic leukemia) ([place])   A-fib ([place])    Follow-up recommendations Follow-up with PCP in 3-5 days , including all  additional recommended appointments as below Follow-up CBC, CMP in 3-5 days Patient to follow-up with neurosurgery Dr.NUDELMAN Cannot use anticoagulation or antiplatelet agent for the next 2 months IMBRUVICA will be restarted by oncology when deemed appropriate     Current Discharge Medication List    START taking these medications   Details  acetaminophen (TYLENOL) 325 MG tablet Take 2 tablets (650 mg total) by mouth every 6 (six) hours as needed for mild pain (or Fever >/= 101). Qty: 30 tablet, Refills: 1    levETIRAcetam (KEPPRA XR) 500 MG 24 hr tablet Take 1 tablet (500 mg total) by mouth 2 (two) times daily. Qty: 60 tablet, Refills: 1    polyethylene glycol (MIRALAX / GLYCOLAX) packet Take 17 g by mouth daily. Qty: 14 each, Refills: 0      CONTINUE these medications which have NOT CHANGED   Details  acyclovir (ZOVIRAX) 400 MG tablet Take 1 tablet (400 mg total) by mouth 2 (two) times daily. Qty: 60 tablet, Refills: 2   Associated Diagnoses: CLL (chronic lymphocytic leukemia) (HCC)    allopurinol (ZYLOPRIM) 300 MG tablet Take 1 tablet (300 mg total) by mouth 2 (two) times daily. Qty: 60 tablet, Refills: 2   Associated Diagnoses: CLL (chronic lymphocytic leukemia) (HCC)    amLODipine (NORVASC) 10 MG tablet Take 10 mg by mouth daily.    carvedilol (COREG) 12.5 MG tablet Take 12.5 mg by mouth 2 (two) times daily with a meal.    Coenzyme Q10 (COQ10) 100 MG CAPS Take 100 mg by mouth daily.     IMBRUVICA 420 MG TABS TAKE ONE TABLET BY MOUTH DAILY. Qty: 28 tablet, Refills: 0    Associated Diagnoses: CLL (chronic lymphocytic leukemia) (HCC)    Multiple Vitamin (MULITIVITAMIN WITH MINERALS) TABS Take 1 tablet by mouth daily.    telmisartan-hydrochlorothiazide (MICARDIS HCT) 80-25 MG tablet Take 1 tablet by mouth daily.    ondansetron (ZOFRAN) 8 MG tablet Take 1 tablet (8 mg total) by mouth every 8 (eight) hours as needed for nausea or vomiting. Qty: 30 tablet, Refills: 2    prochlorperazine (COMPAZINE) 10 MG tablet Take 1 tablet (10 mg total) by mouth every 6 (six) hours as needed for nausea or vomiting. Qty: 30 tablet, Refills: 2      STOP taking these medications     aspirin 325 MG tablet      butalbital-acetaminophen-caffeine (FIORICET, ESGIC) 50-325-40 MG tablet      fish oil-omega-3 fatty acids 1000 MG capsule          Discharge Condition:Stable  Discharge Instructions Get Medicines reviewed and adjusted: Please take all your medications with you for your next visit with your Primary MD  Please request your Primary MD to go over all hospital tests and procedure/radiological results at the follow up, please ask your Primary MD to get all Hospital records sent to his/her office.  If you experience worsening of your admission symptoms, develop shortness of breath, life threatening emergency, suicidal or homicidal thoughts you must seek medical attention immediately by calling 911 or calling your MD immediately if symptoms less severe.  You must read complete instructions/literature along with all the possible adverse reactions/side effects for all the Medicines you take and that have been prescribed to you. Take any new Medicines after you have completely understood and accpet all the possible adverse reactions/side effects.   Do not drive when taking Pain medications.   Do not take more than prescribed Pain, Sleep and Anxiety Medications  Special Instructions: If you have smoked or chewed Tobacco in the last 2 yrs please stop smoking, stop any  regular Alcohol and or any Recreational drug use.  Wear Seat belts while driving.  Please note  You were cared for by a hospitalist during your hospital stay. Once you are discharged, your primary care physician will handle any further medical issues. Please note that NO REFILLS for any discharge medications will be authorized once you are discharged, as it is imperative that you return to your primary care physician (or establish a relationship with a primary care physician if you do not have one) for your aftercare needs so that they can reassess your need for medications and monitor your lab values.     No Known Allergies    Disposition: 01-Home or Self Care   Consults:  Neurosurgery    Significant Diagnostic Studies:  Ct Head Wo Contrast  Result Date: 10/16/2016 CLINICAL DATA:  Intracranial hemorrhage EXAM: CT HEAD WITHOUT CONTRAST TECHNIQUE: Contiguous axial images were obtained from the base of the skull through the vertex without intravenous contrast. COMPARISON:  Brain MRI 5178 FINDINGS: Brain: There is a right parietal subdural drainage catheter. The right convexity subdural hematoma has decreased in size, now measuring approximately 7 mm in thickness, previously a.m. up to 19 mm. Midline shift has resolved. No hydrocephalus. No new area of hemorrhage. There is a small amount of pneumocephalus along the right convexity without mass effect. No evidence acute cortical infarct. Vascular: No hyperdense vessel or unexpected calcification. Skull: Right parietal craniotomy. Sinuses/Orbits: No acute finding. Other: None. IMPRESSION: 1. Decreased size of right subdural hematoma following placement of subdural drainage catheter, with resolution of midline shift. 2. No new hemorrhage, mass effect or hydrocephalus. Electronically Signed   By: Ulyses Jarred M.D.   On: 10/16/2016 04:43   Ct Head Wo Contrast  Result Date: 10/12/2016 CLINICAL DATA:  Headache and incoordination. EXAM: CT HEAD  WITHOUT CONTRAST TECHNIQUE: Contiguous axial images were obtained from the base of the skull through the vertex without intravenous contrast. COMPARISON:  None. FINDINGS: Brain: A 1.4 cm thick right-sided subdural hematoma overlying the right parietal lobe with mixed attenuation suggesting a recent bleed is noted with approximately 3 mm of right to left midline shift. No hydrocephalus is identified. The basal cisterns and fourth ventricle are midline. No intraparenchymal hemorrhage is identified. No large vascular territory infarction is noted. Vascular: No hyperdense vessel or unexpected calcification. Skull: Normal. Negative for fracture or focal lesion. Sinuses/Orbits: No acute finding. Mild ethmoid sinus mucosal thickening. Intact orbits and globes. Mastoids are clear. Other: None IMPRESSION: Right-sided subdural hematoma overlying the parietal convexity measuring up to 1.4 cm in thickness with 3 mm of right to left midline shift. Given mixed attenuation within, findings likely represent a recent event. Critical Value/emergent results were called by telephone at the time of interpretation on 10/12/2016 at 12:08 am to Dr. Delora Fuel , who verbally acknowledged these results. Electronically Signed   By: Ashley Royalty M.D.   On: 10/12/2016 00:09   Mr Brain Wo Contrast  Result Date: 10/12/2016 CLINICAL DATA:  Subdural  hematoma. Right arm weakness. History of CLL. EXAM: MRI HEAD WITHOUT CONTRAST TECHNIQUE: Multiplanar, multiecho pulse sequences of the brain and surrounding structures were obtained without intravenous contrast. COMPARISON:  CT head 10/11/2016 FINDINGS: Brain: Right-sided subdural hematoma has mixed signal intensity with serum and layering and blood products. This appears to be subacute. Subdural blood in the right parietal lobe measuring up to 14 mm in thickness unchanged from the prior CT. Small amount of subdural blood is present in the middle cranial fossa lateral and inferior to the temporal  lobe. Methemoglobin is present within the subdural hematoma compatible with subacute blood. Small amount of subarachnoid hemorrhage is seen with hyperintense signal in the sulci on FLAIR imaging. Ventricle size normal. Mild midline shift to the left of 3 mm is unchanged. Negative for acute infarct. No significant chronic ischemic change. No mass lesion. Vascular: Normal arterial flow voids. Skull and upper cervical spine: Negative Sinuses/Orbits: Mild mucosal edema in the paranasal sinuses. Bilateral cataract removal. Other: None IMPRESSION: Subacute right-sided subdural hematoma measuring up to 14 mm in thickness in the right parietal lobe with small amount of blood in the right middle cranial fossa. 3 mm midline shift to the left is unchanged. Small amount of subarachnoid hemorrhage Negative for acute infarct. Electronically Signed   By: Franchot Gallo M.D.   On: 10/12/2016 07:53       Filed Weights   10/11/16 2320  Weight: 104.3 kg (230 lb)     Microbiology: Recent Results (from the past 240 hour(s))  MRSA PCR Screening     Status: Abnormal   Collection Time: 10/12/16  3:47 AM  Result Value Ref Range Status   MRSA by PCR POSITIVE (A) NEGATIVE Final    Comment:        The GeneXpert MRSA Assay (FDA approved for NASAL specimens only), is one component of a comprehensive MRSA colonization surveillance program. It is not intended to diagnose MRSA infection nor to guide or monitor treatment for MRSA infections. RESULT CALLED TO, READ BACK BY AND VERIFIED WITH: A. Netta Corrigan RN 10:05 10/12/16 ([email or web address])        Blood Culture    Component Value Date/Time   SDES BLOOD LEFT ANTECUBITAL 05/13/2016 1217   SPECREQUEST BOTTLES DRAWN AEROBIC AND ANAEROBIC 8CC 05/13/2016 1217   CULT NO GROWTH 5 DAYS 05/13/2016 1217   REPTSTATUS 05/18/2016 FINAL 05/13/2016 1217      Labs: Results for orders placed or performed during the hospital encounter of 10/11/16 (from the past 48 hour(s))  CBC      Status: Abnormal   Collection Time: 10/18/16  7:04 AM  Result Value Ref Range   WBC 100.4 (HH) 4.0 - 10.5 K/uL    Comment: REPEATED TO VERIFY CRITICAL VALUE NOTED.  VALUE IS CONSISTENT WITH PREVIOUSLY REPORTED AND CALLED VALUE.    RBC 4.40 4.22 - 5.81 MIL/uL   Hemoglobin 13.3 13.0 - 17.0 g/dL   HCT 41.2 39.0 - 52.0 %   MCV 93.6 78.0 - 100.0 fL   MCH 30.2 26.0 - 34.0 pg   MCHC 32.3 30.0 - 36.0 g/dL   RDW 12.9 11.5 - 15.5 %   Platelets 198 150 - 400 K/uL       Lab Results  Component Value Date   CREATININE 0.90 10/16/2016     HPI   76 year old male with history of A. fib on full dose aspirin, CLL with WBC in 140s (onseeing Dr Talbert Cage and is on Imbruvica for Binet Stage C CLL), fall at home  about 4-5 months back and hit his head without loss of consciousness presented to [facility]  ED with migraine like frontal headache for past one and half week. His oncologist had ordered an MRI of the brain to be done yesterday. For the past 2-3 days he also noticed his hands and feet becoming somewhat weak and had some unsteadiness while walking. He had trouble buttoning his shirt. Reports symptoms primarily affecting some skilled motor function rather than his strength. Denies any tingling or numbness, recent fall or loss of consciousness. In the ED CT of the head showed right frontoparietal subdural hematoma measuring up to 14 mm in thickness with 3 mm midline shift to the left. Neurosurgery consulted and patient transferred to [facility] for further management  HOSPITAL COURSE:    Acute/subacute Subdural hematoma (HCC) -Associated mild midline shift and Impairment of fine motor skill in the left hand. -Underwent right frontoparietal craniotomy with evacuation of hematoma on 5/19. Stable postop. -Cannot use anticoagulation or antiplatelet for next 2 months. Aspirin has been discontinued. Fish oil/ IMBRUVICA also discontinued due to its association with high risk of bleeding  -Discussed with his  oncologist, ok to hold it for now. -Cannot use anticoagulation or antiplatelet agent for the next 2 months. Aspirin discontinued.  oncologist and-When necessary oxycodone for pain -Keppra for seizure prophylaxis. -Repeat head CT on 5/21 shows improvement in the size of hematoma and resolution of the midline shift. -JP drain was removed on 5/21. Patient ambulating in the hallway without difficulty. PT OT evaluation pending -Status post cardiology consult for preoperative clearance. Continue perioperative beta blocker. Recommend long-term anticoagulation possibly to be restarted after 2 months. Another option is placement of a watchman device. Follow-up with cardiology as outpatient. 2-D echo with normal EF and no wall motion abnormality. follow-up CT of the brain as well as CT of the cervical spine. CT of the brain shows only a small amount of residual/recurrent subdural collection, without significant mass effect, and overall much improved as compared to prior to surgery. 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 There is ankylosis of C2-3 and probable ankylosis of C6-7, with advanced degeneration seen as well at the C3-4, C4-5, C5-C6, and C7-T1 levels.patient can be discharged home from a neurosurgical perspective      CLL (chronic lymphocytic leukemia) ([place]) Chronic leukocytosis and on irutinib.  held due to high risk for causing hemorrhage including postprocedure bleeding. Was unable to use his oncologist in [place].    Atrial fibrillation (HCC) Aspirin discontinued. Remains in sinus rhythm. Coreg dose reduced due to bradycardia.  Sinus bradycardia  Episodes of heart rate in the 40s. Reduce coreg dose by 50%. On it on telemetry.  Essential hypertension Continue amlodipine, Avapro and HCTZ.  Hypokalemia Replenished.  History of gout Continue allopurinol    Discharge Exam:   Blood pressure 126/75, pulse 87,  temperature 97.5 F (36.4 C), temperature source Oral, resp. rate 18, height 6\' 2"  (1.88 m), weight 104.3 kg (230 lb), SpO2 95 %.  Gen.: Not in distress HEENT: Clean staples right parietal scalp Chest: Clear to auscultation CVS: Normal S1 and S2, no murmurs GI: Soft, nondistended, nontender Musculoskeletal: Warm, no edema CNS: Improving left pronator drift    Follow-up Information    [facility] Follow up.   Specialty:  Cardiology Why:  The office should call you to arrange a cardiology follow in for 2 months. If you do not receive a call within a week, please  call to schedule.  Contact information: [place]       [name], MD. Call.   Specialty:  Internal Medicine Why:  Hospital follow-up in 3-5 days Contact information: [place] [address] [phone]        Jovita Gamma, MD. Call.   Specialty:  Neurosurgery Why:  Hospital follow-up in 3-5 days Contact information: [address] [place] [address] [phone]           Signed: Reyne Dumas 10/18/2016, 9:16 AM        Time spent >45 mins

## 2016-10-18 NOTE — Evaluation (Addendum)
Physical Therapy Evaluation and Discharge Patient Details Name: Andrew [name] MRN: [id number] DOB: 12-18-1940 Today's Date: 10/18/2016   History of Present Illness  Pt is a 76 y/o male admitted secondary to a fall which resulted in a R frontoparietal subdural hematoma. Pt is s/p craniotomy on 10/14/16. PMH includes a fib, HTN, and chronic lymphocytic leukemia.   Clinical Impression  Patient evaluated by Physical Therapy with no further acute PT needs identified. All education has been completed and the patient has no further questions. Pt demonstrated safe, steady gait with dynamic balance activities. Required supervision for all mobility activities. Pt has necessary level of support at home and will not need follow up services. See below for any follow-up Physical Therapy or equipment needs. PT is signing off. Thank you for this referral. If needs change, please re-consult.      Follow Up Recommendations No PT follow up;Supervision - Intermittent    Equipment Recommendations  None recommended by PT    Recommendations for Other Services       Precautions / Restrictions Precautions Precautions: None Restrictions Weight Bearing Restrictions: No      Mobility  Bed Mobility Overal bed mobility: Modified Independent                Transfers Overall transfer level: Modified independent               General transfer comment: Safe, steady technique.   Ambulation/Gait Ambulation/Gait assistance: Supervision Ambulation Distance (Feet): 150 Feet Assistive device: None Gait Pattern/deviations: Step-through pattern;Decreased stride length Gait velocity: Decreased Gait velocity interpretation: Below normal speed for age/gender General Gait Details: Slow, cautious gait, however, steady with dynamic balance tasks. See DGI score below.   Stairs Stairs: Yes Stairs assistance: Supervision Stair Management: Two rails;Alternating pattern;Forwards Number of Stairs:  4 General stair comments: Steady, safe technique with stair navigation. Educated to make sure to take time with stair navigation at home to increase safety.   Wheelchair Mobility    Modified Rankin (Stroke Patients Only)       Balance Overall balance assessment: Needs assistance Sitting-balance support: No upper extremity supported;Feet supported Sitting balance-Leahy Scale: Good     Standing balance support: No upper extremity supported;During functional activity Standing balance-Leahy Scale: Good                   Standardized Balance Assessment Standardized Balance Assessment : Dynamic Gait Index   Dynamic Gait Index Level Surface: Normal Change in Gait Speed: Mild Impairment Gait with Horizontal Head Turns: Normal Gait with Vertical Head Turns: Normal Gait and Pivot Turn: Normal Step Over Obstacle: Mild Impairment Step Around Obstacles: Normal Steps: Mild Impairment Total Score: 21       Pertinent Vitals/Pain Pain Assessment: 0-10 Pain Score: 3  Pain Location: neck  Pain Descriptors / Indicators: Aching Pain Intervention(s): Limited activity within patient's tolerance;Monitored during session;Repositioned    Home Living Family/patient expects to be discharged to:: Private residence Living Arrangements: Spouse/significant other Available Help at Discharge: Family;Available 24 hours/day Type of Home: House Home Access: Stairs to enter Entrance Stairs-Rails: Right;Left;Can reach both Entrance Stairs-Number of Steps: 4 Home Layout: One level Home Equipment: Shower seat - built in      Prior Function Level of Independence: Independent               Hand Dominance   Dominant Hand: Right    Extremity/Trunk Assessment   Upper Extremity Assessment Upper Extremity Assessment: Defer to OT evaluation    Lower  Extremity Assessment Lower Extremity Assessment: Overall WFL for tasks assessed (sensory in tact)    Cervical / Trunk  Assessment Cervical / Trunk Assessment: Normal  Communication   Communication: No difficulties  Cognition Arousal/Alertness: Awake/alert Behavior During Therapy: WFL for tasks assessed/performed Overall Cognitive Status: Within Functional Limits for tasks assessed                                        General Comments General comments (skin integrity, edema, etc.): Pt's wife present during session. Asked about adverse reactions to look for; educated about general abnormal signs after surgery, however, encouraged to ask MD for specific signs to look for.     Exercises     Assessment/Plan    PT Assessment Patent does not need any further PT services  PT Problem List         PT Treatment Interventions      PT Goals (Current goals can be found in the Care Plan section)  Acute Rehab PT Goals Patient Stated Goal: to go home  PT Goal Formulation: With patient Time For Goal Achievement: 10/18/16 Potential to Achieve Goals: Good    Frequency     Barriers to discharge        Co-evaluation               AM-PAC PT "6 Clicks" Daily Activity  Outcome Measure Difficulty turning over in bed (including adjusting bedclothes, sheets and blankets)?: None Difficulty moving from lying on back to sitting on the side of the bed? : None Difficulty sitting down on and standing up from a chair with arms (e.g., wheelchair, bedside commode, etc,.)?: None Help needed moving to and from a bed to chair (including a wheelchair)?: None Help needed walking in hospital room?: None Help needed climbing 3-5 steps with a railing? : None 6 Click Score: 24    End of Session Equipment Utilized During Treatment: Gait belt Activity Tolerance: Patient tolerated treatment well Patient left: in bed;with call bell/phone within reach;with family/visitor present Nurse Communication: Mobility status PT Visit Diagnosis: History of falling (Z91.81)    Time: 4259-5638 PT Time Calculation  (min) (ACUTE ONLY): 13 min   Charges:   PT Evaluation $PT Eval Low Complexity: 1 Procedure     PT G Codes:        Nicky Pugh, PT, DPT  Acute Rehabilitation Services  Pager: [phone]   Army Melia 10/18/2016, 11:13 AM

## 2016-10-18 NOTE — Plan of Care (Signed)
Problem: Physical Regulation: Goal: Neurologic status will improve Outcome: Progressing Patient is alert and oriented x4. He is excited to be going home today and is able to perform ADLs on his own.  Problem: Safety: Goal: Ability to remain free from injury will improve Outcome: Progressing No incidence of falls during this admission. Patient alert and oriented. Call bell within reach. Bed in low and locked position. Clean and clear environment maintained. Nonskid footwear being utilized.2/4 siderails in place. Patient verbalized understanding of safety instruction.

## 2016-10-18 NOTE — Progress Notes (Signed)
Subjective: Patient resting in bed, fairly comfortable. Has been having some posterior neck pain that extends into the shoulders. Has been up and ambulating in the halls. Has not had any further drainage from the drain exit site.  Seen earlier today by Dr. Allyson Sabal. She obtained a follow-up CT of the brain as well as CT of the cervical spine. CT of the brain shows only a small amount of residual/recurrent subdural collection, without significant mass effect, and overall much improved as compared to prior to surgery. 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 There is ankylosis of C2-3 and probable ankylosis of C6-7, with advanced degeneration seen as well at the C3-4, C4-5, C5-C6, and C7-T1 levels.  Objective: Vital signs in last 24 hours: Vitals:   10/17/16 2123 10/18/16 0059 10/18/16 0523 10/18/16 0907  BP: 139/75 (!) 141/80 135/81 126/75  Pulse: 73 68 75 87  Resp: 18 18 18    Temp: 98.4 F (36.9 C) 98.4 F (36.9 C) 97.5 F (36.4 C)   TempSrc: Oral Oral Oral   SpO2: 94% 93% 95%   Weight:      Height:        Intake/Output from previous day: 05/22 0701 - 05/23 0700 In: -  Out: 600 [Urine:600] Intake/Output this shift: No intake/output data recorded.  Physical Exam:  Awake and alert, fully oriented. Mild left pronator drift. Incision healing nicely. No erythema, swelling, or drainage.  CBC  Recent Labs  10/18/16 0704  WBC 100.4*  HGB 13.3  HCT 41.2  PLT 198   BMET  Recent Labs  10/16/16 0244  NA 137  K 4.6  CL 107  CO2 24  GLUCOSE 111*  BUN 24*  CREATININE 0.90  CALCIUM 8.4*    Studies/Results: Ct Head Wo Contrast  Result Date: 10/18/2016 CLINICAL DATA:  Craniectomy 10/14/2016. Diffuse headache. Right-sided neck pain since surgery. EXAM: CT HEAD WITHOUT CONTRAST CT CERVICAL SPINE WITHOUT CONTRAST TECHNIQUE: Multidetector CT imaging of the head and cervical spine was performed following the standard  protocol without intravenous contrast. Multiplanar CT image reconstructions of the cervical spine were also generated. COMPARISON:  Head CT from 2 days ago. FINDINGS: CT HEAD FINDINGS Brain: Interval removal of subdural drain. Stable residual fluid and gas when allowing for redistribution. Maximal thickness measures up to 12 mm with mild right frontal mass effect. No new high-density to suggest hemorrhage. Low, near fatty density material seen along the inferior bone flap, also stable. No evidence of infarct or hydrocephalus. Vascular: Negative Skull: Stable appearance of right-sided craniotomy flap. Sinuses/Orbits: Negative CT CERVICAL SPINE FINDINGS Alignment: Facet mediated anterolisthesis at C4-5 measuring 2 mm. Skull base and vertebrae: Negative for fracture, discitis, or destructive process. Soft tissues and spinal canal: No inflammation or canal hematoma noted. 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 Disc levels: Disc degeneration with advanced narrowing and ridging from C5-6 to C7-T1. There is multilevel facet arthropathy with with particularly bulky spurring at C4-5. C2-3 facet ankylosis bilaterally. No evidence of cord impingement. Diffuse right foraminal narrowing, moderate to advanced at C4-5. Upper chest: No acute finding IMPRESSION: 1. Interval subdural drain removal with stable subdural collection. No evidence of interval hemorrhage. Right frontal mass effect remains mild. No midline shift. 2. No acute finding in the cervical spine. 3. Multilevel facet arthropathy and disc degeneration Electronically Signed   By: Monte Fantasia M.D.   On: 10/18/2016 12:03   Ct Cervical Spine Wo Contrast  Result Date: 10/18/2016 CLINICAL  DATA:  Craniectomy 10/14/2016. Diffuse headache. Right-sided neck pain since surgery. EXAM: CT HEAD WITHOUT CONTRAST CT CERVICAL SPINE WITHOUT CONTRAST TECHNIQUE: Multidetector CT imaging of the head and cervical spine was performed following the standard protocol  without intravenous contrast. Multiplanar CT image reconstructions of the cervical spine were also generated. COMPARISON:  Head CT from 2 days ago. FINDINGS: CT HEAD FINDINGS Brain: Interval removal of subdural drain. Stable residual fluid and gas when allowing for redistribution. Maximal thickness measures up to 12 mm with mild right frontal mass effect. No new high-density to suggest hemorrhage. Low, near fatty density material seen along the inferior bone flap, also stable. No evidence of infarct or hydrocephalus. Vascular: Negative Skull: Stable appearance of right-sided craniotomy flap. Sinuses/Orbits: Negative CT CERVICAL SPINE FINDINGS Alignment: Facet mediated anterolisthesis at C4-5 measuring 2 mm. Skull base and vertebrae: Negative for fracture, discitis, or destructive process. Soft tissues and spinal canal: No inflammation or canal hematoma noted. 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 Disc levels: Disc degeneration with advanced narrowing and ridging from C5-6 to C7-T1. There is multilevel facet arthropathy with with particularly bulky spurring at C4-5. C2-3 facet ankylosis bilaterally. No evidence of cord impingement. Diffuse right foraminal narrowing, moderate to advanced at C4-5. Upper chest: No acute finding IMPRESSION: 1. Interval subdural drain removal with stable subdural collection. No evidence of interval hemorrhage. Right frontal mass effect remains mild. No midline shift. 2. No acute finding in the cervical spine. 3. Multilevel facet arthropathy and disc degeneration Electronically Signed   By: Monte Fantasia M.D.   On: 10/18/2016 12:03    Assessment/Plan: Continuing to do well following craniotomy for evacuation of subdural hematoma. Suspect neck discomfort arising from underlying arthritic degenerative changes and greater time in bed as well as positioning during surgery. For now would favor symptomatic treatment.  At this point the patient can be discharged home from  a neurosurgical perspective. I have explained to he and his wife that I want him walking out in the fresh air at least 2-4 times per day, gradually increasing distances. I do not want him driving until I clear him to do so. However he can ride in the car locally with other folks driving. For now he can shower with a shower cap, and in 2 days he can shower and let the shower water run on the scalp, but do not want him shampooing his hair until I've cleared him to do so. I've asked that he and his wife contact my office today or tomorrow to set up an appointment in 7-9 days for staple removal. I've instructed him not to do any heavy or strenuous activities, and in fact I don't want him lifting more than a plate of food until I clear him to do so. I've given him prescription for Keppra 500 mg twice a day, 60 tablets and 3 refills, and we'll give him further structures regarding of the ventral taper and discontinuation. I've also given for Norco 5/325 one to 2 tablets by mouth every 4 hours when necessary pain, 60 tablets no refills with a further instruction of not to take more than 8 tablets per day. I've explained to the patient and his wife that I want to try and transition from using the Norco to using Tylenol more often for pain and discomfort. Their questions regarding his ongoing care following discharge were answered for them, and I will see him next week for staple removal. I've also discussed my discharge instructions with his  nurse Claiborne Billings.  I appreciate the assistance of Dr. Allyson Sabal and the remainder the hospitalist service.   Hosie Spangle, MD 10/18/2016, 12:10 PM

## 2016-10-18 NOTE — Discharge Instructions (Signed)
Craniotomy, Care After °This sheet gives you information about how to care for yourself after your procedure. Your doctor may also give you more specific instructions. If you have problems or questions, contact your doctor. °Follow these instructions at home: °Wound care °· Follow instructions from your doctor about how to take care of your cut from surgery (incision) and pin insertion areas. Make sure you: °? Wash your hands with soap and water before you change your bandage (dressing). If you cannot use soap and water, use hand sanitizer. °? Change your bandage as told by your doctor. °? Leave stitches (sutures), skin glue, or skin tape (adhesive) strips in place. They may need to stay in place for 2 weeks or longer. If tape strips get loose and curl up, you may trim the loose edges. Do not remove tape strips completely unless your doctor says it is okay. °· Check your cut area and pin insertion sites every day for signs of infection. Check for: °? Redness, swelling, or pain. °? Fluid or blood. °? Warmth. °? Pus or a bad smell. °· Do not take baths, swim, or use a hot tub until your doctor says it is okay. °Activity °· Limit your activities as told by your doctor. You may then slowly increase your activities as told by your doctor. Avoid contact sports for 1 year or as told by your doctor. °· Do not drive until your doctor says it is okay. °· Rest and sleep as much as possible. When you lie down, keep your head raised (elevated) at a 30-degree angle. You can do this with some pillows. This helps keep brain swelling down and prevents increased pressure in the head. Ask your doctor when you can go back to sleeping flat. °· Ask your doctor when you can go back to work. °General instructions °· Wear a helmet as told by your doctor. °· Do not drink alcohol. °· Take over-the-counter and prescription medicines only as told by your doctor. °· To prevent or treat constipation while you are taking prescription pain  medicine, your doctor may suggest that you: °? Drink enough fluid to keep your pee (urine) clear or pale yellow. °? Take over-the-counter or prescription medicines. °? Eat foods that are high in fiber. Such foods include: °§ Fresh fruits. °§ Fresh vegetables. °§ Whole grains. °§ Beans. °? Limit foods that are high in fat and processed sugars, such as fried and sweet foods. °Contact a doctor if: °· You have redness, swelling, or pain around your cut area or pin insertion areas. °· You have fluid or blood coming from your cut area or pin insertion areas. °· Your cut area or pin insertion areas feel warm to the touch. °· You have pus or a bad smell coming from your cut area or pin insertion areas. °· You have a fever. °Get help right away if: °· You have a very bad headache. °· You have uncontrolled shaking or jerking movements (seizures). °· You feel confused. °· You pass out (faint). °· You feel sick to your stomach (nausea) and you throw up (vomit) and it does not stop. °· You feel weak or numb on one side of your body. °· Your speech is slurred. °· You have chest pain, a stiff neck, or trouble breathing. °· You have blurry vision or loss of vision. °· You have swelling or bruising around the eyes. °· Your wound breaks open after the stitches or staples are taken out. °· You have a rash. °· You   feel dizzy. °Summary °· Check your cut area and pin insertion areas every day for signs of infection. Signs of infection include redness, swelling, drainage, or fever. °· After the procedure, return to activities slowly. Rest when you get tired. °· Eat foods that are high in fiber, such as fresh fruits and vegetables, whole grains, and beans. Drink plenty of fluids to avoid constipation after surgery, especially if you are taking pain medicines. °This information is not intended to replace advice given to you by your health care provider. Make sure you discuss any questions you have with your health care provider. °Document  Released: 05/20/2013 Document Revised: 06/02/2016 Document Reviewed: 06/02/2016 °Elsevier Interactive Patient Education © 2017 [employer] ° °

## 2016-10-18 NOTE — Progress Notes (Signed)
RN discussed discharge instructions with patient and patient's wife. They verbalized understanding of discharge instructions including f/u appts to make w n/s, cardiology, and oncologist. Understands medications. Given discharge instructions and prescriptions. Neuro assessment unchanged. Tele removed. Will continue to monitor.

## 2016-10-18 NOTE — Evaluation (Addendum)
Occupational Therapy Evaluation and Discharge Patient Details Name: Andrew [name] MRN: [id number] DOB: [birth year] Today's Date: 10/18/2016    History of Present Illness Pt is a 76 y/o male admitted secondary to a fall which resulted in a R frontoparietal subdural hematoma. Pt is s/p craniotomy on 10/14/16. PMH includes a fib, HTN, and chronic lymphocytic leukemia.    Clinical Impression   Pt presented supine in bed and pleasant/agreeable to occupational therapy. Pt currently mod I for ADL (use of built up handles to assist with L hand funtion) and supervision for in room ambulation. Please see comments below for specifics. Discussed with Pt and wife what outpatient OT would have to offer, and they preferred at this time to see how much better his Left hand does - already improving from yesterday with HEP provided by acute OT. Pt still very functional with sensation in tact. Pt going home with appropriate support from spouse for safety. No further OT needs at this time. Education complete. All questions and concerns answered at this time. OT to sign off. Thank you for this referral.    Follow Up Recommendations  No OT follow up    Equipment Recommendations  None recommended by OT    Recommendations for Other Services       Precautions / Restrictions Precautions Precautions: None Restrictions Weight Bearing Restrictions: No      Mobility Bed Mobility Overal bed mobility: Modified Independent                Transfers Overall transfer level: Modified independent               General transfer comment: Safe, steady technique.     Balance Overall balance assessment: Needs assistance Sitting-balance support: No upper extremity supported;Feet supported Sitting balance-Leahy Scale: Good     Standing balance support: No upper extremity supported;During functional activity Standing balance-Leahy Scale: Good                   Standardized Balance  Assessment Standardized Balance Assessment : Dynamic Gait Index   Dynamic Gait Index Level Surface: Normal Change in Gait Speed: Mild Impairment Gait with Horizontal Head Turns: Normal Gait with Vertical Head Turns: Normal Gait and Pivot Turn: Normal Step Over Obstacle: Mild Impairment Step Around Obstacles: Normal Steps: Mild Impairment Total Score: 21     ADL either performed or assessed with clinical judgement   ADL Overall ADL's : Modified independent                                       General ADL Comments: Pt able to don/doff socks sitting EOB, eat lunch sitting EOB (Pt did much better with built up handle), Pt is right hand dominant     Vision Patient Visual Report: No change from baseline Vision Assessment?: No apparent visual deficits     Perception     Praxis      Pertinent Vitals/Pain Pain Assessment: 0-10 Pain Score: 3  Pain Location: neck  Pain Descriptors / Indicators: Aching;Headache Pain Intervention(s): Monitored during session     Hand Dominance Right   Extremity/Trunk Assessment Upper Extremity Assessment Upper Extremity Assessment: LUE deficits/detail LUE Deficits / Details: strength 4+/5 in left hand, able to perform finger to thumb, but struggled to hold onto fork. sensation intact LUE Coordination: decreased fine motor   Lower Extremity Assessment Lower Extremity Assessment: Defer to PT evaluation  Cervical / Trunk Assessment Cervical / Trunk Assessment: Normal   Communication Communication Communication: No difficulties   Cognition Arousal/Alertness: Awake/alert Behavior During Therapy: WFL for tasks assessed/performed Overall Cognitive Status: Within Functional Limits for tasks assessed                                     General Comments  Pt's wife present for session    Exercises Exercises: Other exercises Other Exercises Other Exercises: Fine Motor Coordination Handout Provided, Reviewed  with Pt and wife Other Exercises: Theraputty handout (and med strength putty) provided and reviewed with Pt and wife   Shoulder Instructions      Home Living Family/patient expects to be discharged to:: Private residence Living Arrangements: Spouse/significant other Available Help at Discharge: Family;Available 24 hours/day Type of Home: House Home Access: Stairs to enter [employer] of Steps: 4 Entrance Stairs-Rails: Right;Left;Can reach both Home Layout: One level     Bathroom Shower/Tub: Occupational psychologist: Standard     Home Equipment: Shower seat - built in          Prior Functioning/Environment Level of Independence: Independent        Comments: retired, enjoys Designer, industrial/product and being outside        OT Problem List: Decreased strength;Decreased range of motion;Decreased coordination;Impaired UE functional use      OT Treatment/Interventions:      OT Goals(Current goals can be found in the care plan section) Acute Rehab OT Goals Patient Stated Goal: to go home  OT Goal Formulation: With patient/family Time For Goal Achievement: 10/26/16 Potential to Achieve Goals: Good  OT Frequency:     Barriers to D/C:            Co-evaluation              AM-PAC PT "6 Clicks" Daily Activity     Outcome Measure Help from another person eating meals?: None Help from another person taking care of personal grooming?: None Help from another person toileting, which includes using toliet, bedpan, or urinal?: None Help from another person bathing (including washing, rinsing, drying)?: None Help from another person to put on and taking off regular upper body clothing?: None Help from another person to put on and taking off regular lower body clothing?: None 6 Click Score: 24   End of Session Nurse Communication: Mobility status (OT complete)  Activity Tolerance: Patient tolerated treatment well Patient left: Other (comment);with  family/visitor present;with call bell/phone within reach (sitting EOB)  OT Visit Diagnosis: Other symptoms and signs involving the nervous system (H60.737)                Time: 1062-6948 OT Time Calculation (min): 29 min Charges:  OT General Charges $OT Visit: 1 Procedure OT Evaluation $OT Eval Low Complexity: 1 Procedure OT Treatments $Therapeutic Exercise: 8-22 mins G-Codes:     Hulda Humphrey OTR/L [place] 10/18/2016, 12:56 PM   Addendum: changed from mod to low complexity eval

## 2016-10-30 ENCOUNTER — Encounter (HOSPITAL_COMMUNITY): Payer: Self-pay | Admitting: Oncology

## 2016-10-30 ENCOUNTER — Other Ambulatory Visit: Payer: Self-pay | Admitting: Neurosurgery

## 2016-10-30 DIAGNOSIS — R001 Bradycardia, unspecified: Secondary | ICD-10-CM | POA: Diagnosis not present

## 2016-10-30 DIAGNOSIS — S065X0D Traumatic subdural hemorrhage without loss of consciousness, subsequent encounter: Secondary | ICD-10-CM | POA: Diagnosis not present

## 2016-10-30 DIAGNOSIS — I482 Chronic atrial fibrillation: Secondary | ICD-10-CM | POA: Diagnosis not present

## 2016-10-30 DIAGNOSIS — C9110 Chronic lymphocytic leukemia of B-cell type not having achieved remission: Secondary | ICD-10-CM | POA: Diagnosis not present

## 2016-10-30 DIAGNOSIS — Z87828 Personal history of other (healed) physical injury and trauma: Secondary | ICD-10-CM

## 2016-10-30 NOTE — Addendum Note (Signed)
Addendum  created 10/30/16 1542 by Oleta Mouse, MD   Sign clinical note

## 2016-11-08 ENCOUNTER — Ambulatory Visit (HOSPITAL_COMMUNITY): Payer: Medicare HMO | Admitting: Oncology

## 2016-11-08 ENCOUNTER — Ambulatory Visit
Admission: RE | Admit: 2016-11-08 | Discharge: 2016-11-08 | Disposition: A | Discharge status: Home / Self Care | Payer: Medicare HMO | Source: Ambulatory Visit | Attending: Neurosurgery | Admitting: Neurosurgery

## 2016-11-08 DIAGNOSIS — I6200 Nontraumatic subdural hemorrhage, unspecified: Secondary | ICD-10-CM | POA: Diagnosis not present

## 2016-11-08 DIAGNOSIS — Z87828 Personal history of other (healed) physical injury and trauma: Secondary | ICD-10-CM

## 2016-11-09 ENCOUNTER — Encounter: Payer: Self-pay | Admitting: Cardiology

## 2016-11-09 ENCOUNTER — Ambulatory Visit (INDEPENDENT_AMBULATORY_CARE_PROVIDER_SITE_OTHER): Payer: Medicare HMO | Admitting: Cardiology

## 2016-11-09 VITALS — BP 94/62 | HR 100 | Ht 74.0 in | Wt 221.0 lb

## 2016-11-09 DIAGNOSIS — I10 Essential (primary) hypertension: Secondary | ICD-10-CM | POA: Diagnosis not present

## 2016-11-09 DIAGNOSIS — Z8679 Personal history of other diseases of the circulatory system: Secondary | ICD-10-CM

## 2016-11-09 DIAGNOSIS — I4891 Unspecified atrial fibrillation: Secondary | ICD-10-CM

## 2016-11-09 MED ORDER — AMLODIPINE BESYLATE 5 MG PO TABS: 5.0000 mg | ORAL_TABLET | Freq: Every day | ORAL | 3 refills | Status: DC

## 2016-11-09 NOTE — Patient Instructions (Signed)
Medication Instructions:  Decrease Norvasc 5 mg daily   Labwork: none  Testing/Procedures: none  Follow-Up: Your physician recommends that you schedule a follow-up appointment in: 6 weeks    Any Other Special Instructions Will Be Listed Below (If Applicable).     If you need a refill on your cardiac medications before your next appointment, please call your pharmacy.

## 2016-11-09 NOTE — Progress Notes (Signed)
Clinical Summary Mr. Mole is a 76 y.o.male seen as hospital f/u, this is our first visit together.    1. PAF - remote DCCV 15 years ago - per neurosurgery not a candidate for at least 2 months for antiplatelets or anticoag CHA2DS2 VASc score is at least 3, however given his  subdural hematoma, he is not a candidate for anticoagulation at this time - no recent palpitations - compliant with meds  2. HTN - compliant with meds   3. Subdural hematoma - s/p craniotomy with evacuation 10/14/16  4. CLL - followed by oncology  5. History of cardiomyopathy -previous records are at [facility] - he reports being told several year ago that his LVEF was 35%, this was around the time of his first diagnosis of afib. - records are not available at this time - recent echo with normal LVEF 55-60%.  Past Medical History:  Diagnosis Date  . Atrial fibrillation ([place])   . CLL (chronic lymphocytic leukemia) ([place])   . Dysrhythmia    Hx. of Atrial Fibrillation- has resolved  . Hypertension   . Pinched nerve in neck      No Known Allergies   Current Outpatient Prescriptions  Medication Sig Dispense Refill  . acetaminophen (TYLENOL) 325 MG tablet Take 2 tablets (650 mg total) by mouth every 6 (six) hours as needed for mild pain (or Fever >/= 101). 30 tablet 1  . acyclovir (ZOVIRAX) 400 MG tablet Take 1 tablet (400 mg total) by mouth 2 (two) times daily. 60 tablet 2  . allopurinol (ZYLOPRIM) 300 MG tablet Take 1 tablet (300 mg total) by mouth 2 (two) times daily. 60 tablet 2  . amLODipine (NORVASC) 10 MG tablet Take 10 mg by mouth daily.    . carvedilol (COREG) 12.5 MG tablet Take 12.5 mg by mouth 2 (two) times daily with a meal.    . Coenzyme Q10 (COQ10) 100 MG CAPS Take 100 mg by mouth daily.     Marland Kitchen HYDROcodone-acetaminophen (NORCO) 5-325 MG tablet Take 1-2 tablets by mouth every 4 (four) hours as needed (pain). No more than 8 tablets per day. 60 tablet 0  .  Ibrutinib (IMBRUVICA) 420 MG TABS Take 1 tablet by mouth daily. 28 tablet 0  . levETIRAcetam (KEPPRA) 500 MG tablet Take 1 tablet (500 mg total) by mouth 2 (two) times daily. 60 tablet 2  . Multiple Vitamin (MULITIVITAMIN WITH MINERALS) TABS Take 1 tablet by mouth daily.    . ondansetron (ZOFRAN) 8 MG tablet Take 1 tablet (8 mg total) by mouth every 8 (eight) hours as needed for nausea or vomiting. (Patient not taking: Reported on 10/05/2016) 30 tablet 2  . polyethylene glycol (MIRALAX / GLYCOLAX) packet Take 17 g by mouth daily. 14 each 0  . prochlorperazine (COMPAZINE) 10 MG tablet Take 1 tablet (10 mg total) by mouth every 6 (six) hours as needed for nausea or vomiting. (Patient not taking: Reported on 09/04/2016) 30 tablet 2  . telmisartan-hydrochlorothiazide (MICARDIS HCT) 80-25 MG tablet Take 1 tablet by mouth daily.     No current facility-administered medications for this visit.      Past Surgical History:  Procedure Laterality Date  . CATARACT EXTRACTION, BILATERAL    . COLONOSCOPY  07/10/2011   Procedure: COLONOSCOPY;  Surgeon: Dorothyann Peng, MD;  [facility];  Service: Endoscopy;  Laterality: N/A;  10:30 AM  . CRANIOTOMY N/A 10/14/2016   Procedure: CRANIOTOMY HEMATOMA EVACUATION SUBDURAL;  Surgeon: Jovita Gamma, MD;  Location: [facility];  Service: Neurosurgery;  Laterality: N/A;  CRANIOTOMY HEMATOMA EVACUATION SUBDURAL  . THYROIDECTOMY, PARTIAL    . TONSILLECTOMY       No Known Allergies    Family History  Problem Relation Age of Onset  . CAD Father 37  . Colon cancer Neg Hx      Social History Mr. Macintyre reports that he has quit smoking. He has a 15.00 pack-year smoking history. He has never used smokeless tobacco. Mr. Veals reports that he does not drink alcohol.   Review of Systems CONSTITUTIONAL: No weight loss, fever, chills, weakness or fatigue.  HEENT: Eyes: No visual loss, blurred vision, double vision or yellow sclerae.No hearing loss,  sneezing, congestion, runny nose or sore throat.  SKIN: No rash or itching.  CARDIOVASCULAR: per hpi RESPIRATORY: No shortness of breath, cough or sputum.  GASTROINTESTINAL: No anorexia, nausea, vomiting or diarrhea. No abdominal pain or blood.  GENITOURINARY: No burning on urination, no polyuria NEUROLOGICAL: No headache, dizziness, syncope, paralysis, ataxia, numbness or tingling in the extremities. No change in bowel or bladder control.  MUSCULOSKELETAL: No muscle, back pain, joint pain or stiffness.  LYMPHATICS: No enlarged nodes. No history of splenectomy.  PSYCHIATRIC: No history of depression or anxiety.  ENDOCRINOLOGIC: No reports of sweating, cold or heat intolerance. No polyuria or polydipsia.  Marland Kitchen   Physical Examination Vitals:   11/09/16 0908 11/09/16 0909  BP:  94/62  Pulse: 100    Vitals:   11/09/16 0901  Weight: 221 lb (100.2 kg)  Height: 6\' 2"  (1.88 m)    Gen: resting comfortably, no acute distress HEENT: no scleral icterus, pupils equal round and reactive, no palptable cervical adenopathy,  CV: irreg, no m/r/g, no jvd Resp: Clear to auscultation bilaterally GI: abdomen is soft, non-tender, non-distended, normal bowel sounds, no hepatosplenomegaly MSK: extremities are warm, no edema.  Skin: warm, no rash Neuro:  no focal deficits Psych: appropriate affect   Diagnostic Studies  09/2016 echo Study Conclusions  - Left ventricle: The cavity size was normal. There was mild   concentric hypertrophy. Systolic function was normal. The   estimated ejection fraction was in the range of 55% to 60%. Wall   motion was normal; there were no regional wall motion   abnormalities. - Aortic valve: There was trivial regurgitation. - Left atrium: The atrium was mildly dilated.   Assessment and Plan  1. PAF - no current symptoms, continue rate control - off anticoag due to recent head bleed. Will need to discuss with neuro possibility for restarting at any point in the  future, at this time recs are for no anticoag or antiplatelets minimum of 2 months  2. HTN - soft bp's today, we will lower norvasc to 5mg  daily  3. History of cardiomyopathy - request records, he reports LVEF as low as 35% in the past. Most recent echo shows normal LV systolic function   F/u 6 weeks   [name], M.D.

## 2016-11-16 ENCOUNTER — Encounter (HOSPITAL_BASED_OUTPATIENT_CLINIC_OR_DEPARTMENT_OTHER): Payer: Medicare HMO | Admitting: Adult Health

## 2016-11-16 ENCOUNTER — Encounter (HOSPITAL_COMMUNITY): Payer: Self-pay | Admitting: Adult Health

## 2016-11-16 ENCOUNTER — Encounter (HOSPITAL_COMMUNITY): Payer: Medicare HMO | Attending: Oncology

## 2016-11-16 VITALS — BP 110/68 | HR 59 | Temp 97.8°F | Resp 18 | Ht 74.0 in | Wt 224.0 lb

## 2016-11-16 DIAGNOSIS — I4891 Unspecified atrial fibrillation: Secondary | ICD-10-CM | POA: Diagnosis not present

## 2016-11-16 DIAGNOSIS — C9110 Chronic lymphocytic leukemia of B-cell type not having achieved remission: Secondary | ICD-10-CM

## 2016-11-16 DIAGNOSIS — S065X9A Traumatic subdural hemorrhage with loss of consciousness of unspecified duration, initial encounter: Secondary | ICD-10-CM

## 2016-11-16 DIAGNOSIS — I6200 Nontraumatic subdural hemorrhage, unspecified: Secondary | ICD-10-CM

## 2016-11-16 DIAGNOSIS — S065XAA Traumatic subdural hemorrhage with loss of consciousness status unknown, initial encounter: Secondary | ICD-10-CM

## 2016-11-16 DIAGNOSIS — C9190 Lymphoid leukemia, unspecified not having achieved remission: Secondary | ICD-10-CM | POA: Diagnosis not present

## 2016-11-16 LAB — COMPREHENSIVE METABOLIC PANEL
ALT: 16 U/L — ABNORMAL LOW (ref 17–63)
AST: 21 U/L (ref 15–41)
Albumin: 4.2 g/dL (ref 3.5–5.0)
Alkaline Phosphatase: 52 U/L (ref 38–126)
Anion gap: 8 (ref 5–15)
BILIRUBIN TOTAL: 0.9 mg/dL (ref 0.3–1.2)
BUN: 18 mg/dL (ref 6–20)
CO2: 29 mmol/L (ref 22–32)
Calcium: 9.5 mg/dL (ref 8.9–10.3)
Chloride: 104 mmol/L (ref 101–111)
Creatinine, Ser: 1.17 mg/dL (ref 0.61–1.24)
GFR, EST NON AFRICAN AMERICAN: 59 mL/min — AB (ref >60)
Glucose, Bld: 95 mg/dL (ref 65–99)
POTASSIUM: 3.9 mmol/L (ref 3.5–5.1)
Sodium: 141 mmol/L (ref 135–145)
TOTAL PROTEIN: 6.2 g/dL — AB (ref 6.5–8.1)

## 2016-11-16 LAB — CBC WITH DIFFERENTIAL/PLATELET
BASOS PCT: 0 %
Basophils Absolute: 0 10*3/uL (ref 0.0–0.1)
EOS PCT: 1 %
Eosinophils Absolute: 0.3 10*3/uL (ref 0.0–0.7)
HEMATOCRIT: 43.8 % (ref 39.0–52.0)
Hemoglobin: 14.9 g/dL (ref 13.0–17.0)
LYMPHS ABS: 25 10*3/uL — AB (ref 0.7–4.0)
Lymphocytes Relative: 82 %
MCH: 31 pg (ref 26.0–34.0)
MCHC: 34 g/dL (ref 30.0–36.0)
MCV: 91.3 fL (ref 78.0–100.0)
MONOS PCT: 2 %
Monocytes Absolute: 0.6 10*3/uL (ref 0.1–1.0)
NEUTROS ABS: 4.6 10*3/uL (ref 1.7–7.7)
Neutrophils Relative %: 15 %
Platelets: 81 10*3/uL — ABNORMAL LOW (ref 150–400)
RBC: 4.8 MIL/uL (ref 4.22–5.81)
RDW: 13.9 % (ref 11.5–15.5)
WBC: 30.5 10*3/uL — ABNORMAL HIGH (ref 4.0–10.5)

## 2016-11-16 LAB — PHOSPHORUS: Phosphorus: 3.4 mg/dL (ref 2.5–4.6)

## 2016-11-16 LAB — URIC ACID: URIC ACID, SERUM: 4.2 mg/dL — AB (ref 4.4–7.6)

## 2016-11-16 NOTE — Progress Notes (Signed)
[facility] [address] [place], [place] [address]   CLINIC:  Medical Oncology/Hematology  PCP:  [name], MD [place] [address] [phone]   REASON FOR VISIT:  Follow-up for CLL  CURRENT THERAPY: Imbruvica 420 mg, beginning 08/19/16 ON HOLD    BRIEF ONCOLOGIC HISTORY:    CLL (chronic lymphocytic leukemia) ([place])   03/09/2015 Initial Diagnosis    CLL (chronic lymphocytic leukemia) ([place])     08/03/2016 Imaging    CT neck- 1. Diffuse and bulky lymphadenopathy in keeping with history of CLL. Nodal enlargement has diffusely progressed since April 2016. 2. Chest and abdomen CT reported separately.      08/03/2016 Imaging    CT CAP- 1. Bulky lymphadenopathy in the chest, abdomen, and pelvis as described. 2. Splenomegaly. 3. Coronary artery and thoracoabdominal aortic atherosclerosis. 4. Prostatomegaly. 5. Several tiny lucent lesions in the bony anatomic pelvis, nonspecific, but attention on follow-up imaging recommended.      08/10/2016 Procedure    Bone marrow aspiration and biopsy by IR.      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 PERIPHERAL BLOOD: - CHRONIC LYMPHOCYTIC LEUKEMIA. - THROMBOCYTOPENIA.      08/11/2016 Pathology Results    Bone Marrow Flow Cytometry - CHRONIC LYMPHOCYTIC LEUKEMIA/SMALL LYMPHOCYTIC LYMPHOMA.      08/16/2016 Pathology Results    Normal cytogenetics.  Molecular cytogenetic analysis by [facility] demonstrates a 13q-      08/19/2016 -  Chemotherapy    Imbruvica 420 mg daily         HISTORY OF PRESENT ILLNESS:  (From Kirby Crigler, PA-C's last note on 09/04/16)     INTERVAL HISTORY:  Mr. Colleran 76 y.o. male returns for follow-up for CLL.   At his last visit at the cancer center on 10/05/16, he endorsed migraine-like headaches with unilateral headache with light  sensitivity. He denied any motor deficits at that time.  He was given a prescription for Fioricet, with strict instructions to call us if headaches did not improve. He called our office on 10/09/16 reporting that headaches had not improved. MRI brain was ordered and showed frontoparietal subdural hematoma measuring up to 14 mm with 3 mm midline shift to the left. Patient presented to ED and was hospitalized from 10/11/16-10/18/16 for subdural hematoma. In the ED, he reported a fall 4-5 months prior where he hit his head without loss of consciousness.  While in the ED, he had hand and feet weakness and some unsteadiness while walking; he reportedly had issues with skilled motor function like buttoning his shirt. Neurosurgery was consulted based on MRI results and on 10/14/16, Mr. Evilsizer underwent craniotomy for hematoma evacuation. Imbruvica held during hospitalization. All anticoagulation stopped, with instructions not to resume for at least 2 months. Repeat brain imaging with CT head on 10/16/16 demonstrated improvement in size of hematoma and resolution of midline shift.    Imbruvica therapy remains on hold at present d/t recent events.   He is here today with his wife. He tells me that he feels great. Energy levels are 75%; appetite 100%. Headache is completely resolved and no remains neurological symptoms either including extremity weakness, unstable gait, speech changes, etc. He feels very well.    He and his wife have concerns about subsequent treatment strategy for his CLL.  She share with me that Dr. Sherwood Gambler with neurosurgery is very concerned about  resuming Imbruvica given recent brain bleed.  He has follow-up with Dr. Sherwood Gambler again soon with repeat CT imaging "because the last scan showed a little bit of swelling and he wants to make sure that is better."  Remains on Keppra for seizure prophylaxis.   Mr. Eichinger wants to know why he is on Acyclovir. "A bunch of the doctors asked me why I was taking  this and I wasn't sure."  He also has a new diagnosis of A-fib; all anti-coagulation is on hold given subdural hematoma.   He tells me that he has recovered very well and is largely with no complaints today.        REVIEW OF SYSTEMS:  Review of Systems  Constitutional: Negative.  Negative for chills, fatigue and fever.  HENT:  Negative.  Negative for lump/mass and nosebleeds.   Eyes: Negative.   Respiratory: Negative.  Negative for cough and shortness of breath.   Cardiovascular: Negative.  Negative for chest pain and leg swelling.  Gastrointestinal: Negative.  Negative for abdominal pain, blood in stool, constipation, diarrhea, nausea and vomiting.  Endocrine: Negative.   Genitourinary: Negative.  Negative for dysuria and hematuria.   Musculoskeletal: Negative.  Negative for arthralgias and gait problem.  Skin: Negative.  Negative for rash.  Neurological: Negative.  Negative for dizziness, extremity weakness, gait problem, headaches and speech difficulty.  Hematological: Negative.  Negative for adenopathy. Does not bruise/bleed easily.  Psychiatric/Behavioral: Negative.  Negative for depression and sleep disturbance. The patient is not nervous/anxious.       PAST MEDICAL/SURGICAL HISTORY:  Past Medical History:  Diagnosis Date  . Atrial fibrillation ([place])   . CLL (chronic lymphocytic leukemia) ([place])   . Dysrhythmia    Hx. of Atrial Fibrillation- has resolved  . Hypertension   . Pinched nerve in neck    Past Surgical History:  Procedure Laterality Date  . CATARACT EXTRACTION, BILATERAL    . COLONOSCOPY  07/10/2011   Procedure: COLONOSCOPY;  Surgeon: Dorothyann Peng, MD;  [facility];  Service: Endoscopy;  Laterality: N/A;  10:30 AM  . CRANIOTOMY N/A 10/14/2016   Procedure: CRANIOTOMY HEMATOMA EVACUATION SUBDURAL;  Surgeon: Jovita Gamma, MD;  Location: [facility];  Service: Neurosurgery;  Laterality: N/A;  CRANIOTOMY HEMATOMA EVACUATION SUBDURAL  . THYROIDECTOMY,  PARTIAL    . TONSILLECTOMY       SOCIAL HISTORY:  Social History   Social History  . Marital status: Married    Spouse name: N/A  . Number of children: N/A  . Years of education: N/A   Occupational History  . Not on file.   Social History Main Topics  . Smoking status: Former Smoker    Packs/day: 1.00    Years: 15.00  . Smokeless tobacco: Never Used  . Alcohol use No  . Drug use: No  . Sexual activity: Not on file   Other Topics Concern  . Not on file   Social History Narrative  . No narrative on file    FAMILY HISTORY:  Family History  Problem Relation Age of Onset  . CAD Father 85  . Cancer Mother   . Alzheimer's disease Mother   . Colon cancer Neg Hx     CURRENT MEDICATIONS:  Outpatient Encounter Prescriptions as of 11/16/2016  Medication Sig  . acyclovir (ZOVIRAX) 400 MG tablet Take 1 tablet (400 mg total) by mouth 2 (two) times daily.  Marland Kitchen allopurinol (ZYLOPRIM) 300 MG tablet Take 1 tablet (300 mg total) by mouth 2 (two)  times daily.  Marland Kitchen amLODipine (NORVASC) 5 MG tablet Take 1 tablet (5 mg total) by mouth daily.  . carvedilol (COREG) 12.5 MG tablet Take 12.5 mg by mouth 2 (two) times daily with a meal.  . Coenzyme Q10 (COQ10) 100 MG CAPS Take 100 mg by mouth daily.   Marland Kitchen levETIRAcetam (KEPPRA) 500 MG tablet Take 1 tablet (500 mg total) by mouth 2 (two) times daily.  . Multiple Vitamin (MULITIVITAMIN WITH MINERALS) TABS Take 1 tablet by mouth daily.  Marland Kitchen telmisartan-hydrochlorothiazide (MICARDIS HCT) 80-25 MG tablet Take 1 tablet by mouth daily.   No facility-administered encounter medications on file as of 11/16/2016.     ALLERGIES:  No Known Allergies   PHYSICAL EXAM:  ECOG Performance status: 1 - Symptomatic, but independent.   Vitals:   11/16/16 1012  BP: 110/68  Pulse: (!) 59  Resp: 18  Temp: 97.8 F (36.6 C)   Filed Weights   11/16/16 1012  Weight: 224 lb (101.6 kg)    Physical Exam  Constitutional: He is oriented to person, place, and  time and well-developed, well-nourished, and in no distress.  HENT:  Head: Normocephalic.  Mouth/Throat: Oropharynx is clear and moist. No oropharyngeal exudate.  Eyes: Conjunctivae are normal. Pupils are equal, round, and reactive to light. No scleral icterus.  Neck: Normal range of motion. Neck supple.  Cardiovascular: Regular rhythm and normal heart sounds.   Bradycardia   Pulmonary/Chest: Effort normal and breath sounds normal. No respiratory distress. He has no wheezes. He has no rales.  Abdominal: Soft. Bowel sounds are normal. There is no tenderness. There is no rebound and no guarding.  Musculoskeletal: Normal range of motion. He exhibits no edema.  Lymphadenopathy:    He has cervical adenopathy (very subtle posterior cervical adenopathy; L > R, but improved from previous exams (all sub-cm now)).    He has no axillary adenopathy.       Right: No inguinal and no supraclavicular adenopathy present.       Left: No inguinal and no supraclavicular adenopathy present.  Neurological: He is alert and oriented to person, place, and time. No cranial nerve deficit. Gait normal.  Skin: Skin is warm and dry. No rash noted.  Psychiatric: Mood, memory, affect and judgment normal.  Nursing note and vitals reviewed.    LABORATORY DATA:  I have reviewed the labs as listed.  CBC    Component Value Date/Time   WBC 30.5 (H) 11/16/2016 0940   RBC 4.80 11/16/2016 0940   HGB 14.9 11/16/2016 0940   HCT 43.8 11/16/2016 0940   PLT 81 (L) 11/16/2016 0940   MCV 91.3 11/16/2016 0940   MCH 31.0 11/16/2016 0940   MCHC 34.0 11/16/2016 0940   RDW 13.9 11/16/2016 0940   LYMPHSABS 25.0 (H) 11/16/2016 0940   MONOABS 0.6 11/16/2016 0940   EOSABS 0.3 11/16/2016 0940   BASOSABS 0.0 11/16/2016 0940   CMP Latest Ref Rng & Units 11/16/2016 10/16/2016 10/15/2016  Glucose 65 - 99 mg/dL 95 111(H) 148(H)  BUN 6 - 20 mg/dL 18 24(H) 18  Creatinine 0.61 - 1.24 mg/dL 1.17 0.90 1.03  Sodium 135 - 145 mmol/L 141 137  132(L)  Potassium 3.5 - 5.1 mmol/L 3.9 4.6 5.1  Chloride 101 - 111 mmol/L 104 107 101  CO2 22 - 32 mmol/L '29 24 24  ' Calcium 8.9 - 10.3 mg/dL 9.5 8.4(L) 8.5(L)  Total Protein 6.5 - 8.1 g/dL 6.2(L) - -  Total Bilirubin 0.3 - 1.2 mg/dL 0.9 - -  Alkaline Phos 38 - 126 U/L 52 - -  AST 15 - 41 U/L 21 - -  ALT 17 - 63 U/L 16(L) - -    PENDING LABS:    DIAGNOSTIC IMAGING:  *The following radiologic images and reports have been reviewed independently and agree with below findings.  CT neck: 08/03/16     CT chest/abd/pelvis: 08/03/16       MRI brain: 10/12/16      PATHOLOGY:  Bone marrow biopsy: 08/10/16          ASSESSMENT & PLAN:   CLL:  -CT neck/chest/abd/pelvis on 08/03/16 showed progressive adenopathy. 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 Progressive thrombocytopenia appreciated with platelets <100,000. Treatment initiated with Imbruvica 420 mg daily on 08/19/16.  -WBCs improved at 30.5 today. Platelets low at 81,000 (down from 198,000 on 10/18/16).  -Imbruvica on hold given recent subdural hematoma requiring craniotomy. Discussed with patient that it is unlikely that Imbruvica directed 'caused' the brain bleed. It was likely secondary to head trauma and Imbruvica may have contributed to ineffective healing over time.. Per patient, Dr. Sherwood Gambler is reluctant about the patient being put back on Imbruvica. -Discussed with Dr. Talbert Cage. We will continue to hold Imbruvica. He will return in 1 month to follow-up with Dr. Talbert Cage regarding potential alternative treatment options for his CLL.  He agrees with this plan.   Recent subdural hematoma s/p craniotomy/Headaches:  -Recent hospitalization from 10/11/16-10/18/16 for subdural hematoma with midline shift.  Craniotomy with evacuation of hematoma performed by Dr. Sherwood Gambler on 10/14/16. On repeat CT head imaging ordered by neurosurgery on 11/08/16 showed improved right subdural hematoma with noted residual 6 mm hypodense collection  that radiologist recommended continued surveillance.  -Continue Keppra for seizure prophylaxis as directed.  -Hold any anticoagulation as directed by neurosurgery.  -Maintain follow-up with Dr. Sherwood Gambler, neurosurgery.   Antiviral prophylaxis:  -Discussed the role Acyclovir plays in preventing varicella infections in patients with chronic leukemia on chemotherapy/Imbruvica. Encouraged him to continue for now and Dr. Talbert Cage can provide further direction for further VZV prophy, if needed, with different treatment.         Dispo:  -Return to cancer center in 1 month to follow-up with Dr. Talbert Cage regarding subsequent treatment plans.       All questions were answered to patient's stated satisfaction. Encouraged patient to call with any new concerns or questions before his next visit to the cancer center and we can certain see him sooner, if needed.    Plan of care discussed with Dr. Talbert Cage, who agrees with the above aforementioned.    A total of 25 minutes was spent in face-to-face care of this patient, with greater than 50% of that time spent in counseling and care-coordination.    Orders placed this encounter:  No orders of the defined types were placed in this encounter.     Mike Craze, NP [place] [phone]

## 2016-11-22 ENCOUNTER — Other Ambulatory Visit: Payer: Self-pay | Admitting: Neurosurgery

## 2016-11-22 DIAGNOSIS — Z9889 Other specified postprocedural states: Secondary | ICD-10-CM

## 2016-12-04 DIAGNOSIS — L570 Actinic keratosis: Secondary | ICD-10-CM | POA: Diagnosis not present

## 2016-12-04 DIAGNOSIS — X32XXXD Exposure to sunlight, subsequent encounter: Secondary | ICD-10-CM | POA: Diagnosis not present

## 2016-12-11 ENCOUNTER — Ambulatory Visit
Admission: RE | Admit: 2016-12-11 | Discharge: 2016-12-11 | Disposition: A | Discharge status: Home / Self Care | Payer: Medicare HMO | Source: Ambulatory Visit | Attending: Neurosurgery | Admitting: Neurosurgery

## 2016-12-11 DIAGNOSIS — I6200 Nontraumatic subdural hemorrhage, unspecified: Secondary | ICD-10-CM | POA: Diagnosis not present

## 2016-12-11 DIAGNOSIS — Z9889 Other specified postprocedural states: Secondary | ICD-10-CM

## 2016-12-14 ENCOUNTER — Other Ambulatory Visit (HOSPITAL_COMMUNITY): Payer: Self-pay | Admitting: *Deleted

## 2016-12-14 DIAGNOSIS — C9110 Chronic lymphocytic leukemia of B-cell type not having achieved remission: Secondary | ICD-10-CM

## 2016-12-18 ENCOUNTER — Inpatient Hospital Stay (HOSPITAL_COMMUNITY): Admit: 2016-12-18 | Payer: Self-pay

## 2016-12-18 ENCOUNTER — Encounter (HOSPITAL_COMMUNITY): Payer: Medicare HMO | Attending: Oncology

## 2016-12-18 ENCOUNTER — Encounter (HOSPITAL_BASED_OUTPATIENT_CLINIC_OR_DEPARTMENT_OTHER): Payer: Medicare HMO | Admitting: Oncology

## 2016-12-18 VITALS — BP 109/63 | HR 79 | Temp 97.7°F | Resp 14 | Wt 230.8 lb

## 2016-12-18 DIAGNOSIS — C9110 Chronic lymphocytic leukemia of B-cell type not having achieved remission: Secondary | ICD-10-CM | POA: Diagnosis not present

## 2016-12-18 DIAGNOSIS — D696 Thrombocytopenia, unspecified: Secondary | ICD-10-CM

## 2016-12-18 DIAGNOSIS — R51 Headache: Secondary | ICD-10-CM | POA: Diagnosis not present

## 2016-12-18 DIAGNOSIS — I6200 Nontraumatic subdural hemorrhage, unspecified: Secondary | ICD-10-CM | POA: Diagnosis not present

## 2016-12-18 DIAGNOSIS — D72829 Elevated white blood cell count, unspecified: Secondary | ICD-10-CM

## 2016-12-18 DIAGNOSIS — C9190 Lymphoid leukemia, unspecified not having achieved remission: Secondary | ICD-10-CM | POA: Diagnosis present

## 2016-12-18 LAB — COMPREHENSIVE METABOLIC PANEL
ALK PHOS: 59 U/L (ref 38–126)
ALT: 15 U/L — AB (ref 17–63)
AST: 21 U/L (ref 15–41)
Albumin: 4.2 g/dL (ref 3.5–5.0)
Anion gap: 7 (ref 5–15)
BUN: 24 mg/dL — AB (ref 6–20)
CALCIUM: 9.6 mg/dL (ref 8.9–10.3)
CHLORIDE: 103 mmol/L (ref 101–111)
CO2: 27 mmol/L (ref 22–32)
CREATININE: 1.26 mg/dL — AB (ref 0.61–1.24)
GFR calc non Af Amer: 54 mL/min — ABNORMAL LOW (ref >60)
Glucose, Bld: 105 mg/dL — ABNORMAL HIGH (ref 65–99)
Potassium: 3.7 mmol/L (ref 3.5–5.1)
SODIUM: 137 mmol/L (ref 135–145)
Total Bilirubin: 1.2 mg/dL (ref 0.3–1.2)
Total Protein: 6.4 g/dL — ABNORMAL LOW (ref 6.5–8.1)

## 2016-12-18 LAB — CBC WITH DIFFERENTIAL/PLATELET
BASOS PCT: 0 %
Basophils Absolute: 0 10*3/uL (ref 0.0–0.1)
Eosinophils Absolute: 0.3 10*3/uL (ref 0.0–0.7)
Eosinophils Relative: 1 %
HCT: 44.3 % (ref 39.0–52.0)
HEMOGLOBIN: 15.4 g/dL (ref 13.0–17.0)
LYMPHS ABS: 17 10*3/uL — AB (ref 0.7–4.0)
Lymphocytes Relative: 81 %
MCH: 31.4 pg (ref 26.0–34.0)
MCHC: 34.8 g/dL (ref 30.0–36.0)
MCV: 90.4 fL (ref 78.0–100.0)
MONOS PCT: 2 %
Monocytes Absolute: 0.4 10*3/uL (ref 0.1–1.0)
NEUTROS ABS: 3.3 10*3/uL (ref 1.7–7.7)
NEUTROS PCT: 16 %
PLATELETS: 79 10*3/uL — AB (ref 150–400)
RBC: 4.9 MIL/uL (ref 4.22–5.81)
RDW: 14.7 % (ref 11.5–15.5)
WBC: 20.9 10*3/uL — AB (ref 4.0–10.5)

## 2016-12-18 LAB — PHOSPHORUS: PHOSPHORUS: 3.6 mg/dL (ref 2.5–4.6)

## 2016-12-18 LAB — URIC ACID: Uric Acid, Serum: 4.2 mg/dL — ABNORMAL LOW (ref 4.4–7.6)

## 2016-12-18 NOTE — Progress Notes (Signed)
Andrew [name], [place] [id number]   CLINIC:  Medical Oncology/Hematology  PCP:  [name], MD [place] [address] [phone]   REASON FOR VISIT:  Follow-up for CLL  CURRENT THERAPY: Imbruvica 420 mg, beginning 08/19/16 ON HOLD    BRIEF ONCOLOGIC HISTORY:    CLL (chronic lymphocytic leukemia) ([place])   03/09/2015 Initial Diagnosis    CLL (chronic lymphocytic leukemia) ([place])     08/03/2016 Imaging    CT neck- 1. Diffuse and bulky lymphadenopathy in keeping with history of CLL. Nodal enlargement has diffusely progressed since April 2016. 2. Chest and abdomen CT reported separately.      08/03/2016 Imaging    CT CAP- 1. Bulky lymphadenopathy in the chest, abdomen, and pelvis as described. 2. Splenomegaly. 3. Coronary artery and thoracoabdominal aortic atherosclerosis. 4. Prostatomegaly. 5. Several tiny lucent lesions in the bony anatomic pelvis, nonspecific, but attention on follow-up imaging recommended.      08/10/2016 Procedure    Bone marrow aspiration and biopsy by IR.      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 PERIPHERAL BLOOD: - CHRONIC LYMPHOCYTIC LEUKEMIA. - THROMBOCYTOPENIA.      08/11/2016 Pathology Results    Bone Marrow Flow Cytometry - CHRONIC LYMPHOCYTIC LEUKEMIA/SMALL LYMPHOCYTIC LYMPHOMA.      08/16/2016 Pathology Results    Normal cytogenetics.  Molecular cytogenetic analysis by [facility] demonstrates a 13q-      08/19/2016 -  Chemotherapy    Imbruvica 420 mg daily         HISTORY OF PRESENT ILLNESS:  (From Kirby Crigler, PA-C's last note on 09/04/16)     INTERVAL HISTORY:  Andrew [name] 76 y.o. male returns for follow-up for CLL.   Since his last visit patient a CT head without contrast on 12/11/16 which demonstrated interval resolution of extra axial  hemorrhage and fluid collection following craniotomy, no new hemorrhage or mass effect. Patient has seen his neurosurgeon, Dr. Sherwood Gambler, who is slowly weaning him off of the keppra. Patient has not had any seizures. Overall he states he feels very well. He denies any headaches, night sweats, weight loss, nausea, vomiting, diarrhea, chest pain or shortness of breath.      REVIEW OF SYSTEMS:  Review of Systems  Constitutional: Negative.  Negative for chills, fatigue and fever.  HENT:  Negative.  Negative for lump/mass and nosebleeds.   Eyes: Negative.   Respiratory: Negative.  Negative for cough and shortness of breath.   Cardiovascular: Negative.  Negative for chest pain and leg swelling.  Gastrointestinal: Negative.  Negative for abdominal pain, blood in stool, constipation, diarrhea, nausea and vomiting.  Endocrine: Negative.   Genitourinary: Negative.  Negative for dysuria and hematuria.   Musculoskeletal: Negative.  Negative for arthralgias and gait problem.  Skin: Negative.  Negative for rash.  Neurological: Negative.  Negative for dizziness, extremity weakness, gait problem, headaches and speech difficulty.  Hematological: Negative.  Negative for adenopathy. Does not bruise/bleed easily.  Psychiatric/Behavioral: Negative.  Negative for depression and sleep disturbance. The patient is not nervous/anxious.       PAST MEDICAL/SURGICAL HISTORY:  Past Medical History:  Diagnosis Date  . Atrial fibrillation ([place])   . CLL (chronic lymphocytic leukemia) ([place])   . Dysrhythmia    Hx. of Atrial Fibrillation- has resolved  . Hypertension   . Pinched nerve in neck  Past Surgical History:  Procedure Laterality Date  . CATARACT EXTRACTION, BILATERAL    . COLONOSCOPY  07/10/2011   Procedure: COLONOSCOPY;  Surgeon: Dorothyann Peng, MD;  [facility];  Service: Endoscopy;  Laterality: N/A;  10:30 AM  . CRANIOTOMY N/A 10/14/2016   Procedure: CRANIOTOMY HEMATOMA EVACUATION  SUBDURAL;  Surgeon: Jovita Gamma, MD;  Location: [facility];  Service: Neurosurgery;  Laterality: N/A;  CRANIOTOMY HEMATOMA EVACUATION SUBDURAL  . THYROIDECTOMY, PARTIAL    . TONSILLECTOMY       SOCIAL HISTORY:  Social History   Social History  . Marital status: Married    Spouse name: N/A  . Number of children: N/A  . Years of education: N/A   Occupational History  . Not on file.   Social History Main Topics  . Smoking status: Former Smoker    Packs/day: 1.00    Years: 15.00  . Smokeless tobacco: Never Used  . Alcohol use No  . Drug use: No  . Sexual activity: Not on file   Other Topics Concern  . Not on file   Social History Narrative  . No narrative on file    FAMILY HISTORY:  Family History  Problem Relation Age of Onset  . CAD Father 94  . Cancer Mother   . Alzheimer's disease Mother   . Colon cancer Neg Hx     CURRENT MEDICATIONS:  Outpatient Encounter Prescriptions as of 12/18/2016  Medication Sig  . acyclovir (ZOVIRAX) 400 MG tablet Take 1 tablet (400 mg total) by mouth 2 (two) times daily.  Marland Kitchen allopurinol (ZYLOPRIM) 300 MG tablet Take 1 tablet (300 mg total) by mouth 2 (two) times daily.  Marland Kitchen amLODipine (NORVASC) 5 MG tablet Take 1 tablet (5 mg total) by mouth daily.  . carvedilol (COREG) 12.5 MG tablet Take 12.5 mg by mouth 2 (two) times daily with a meal.  . Coenzyme Q10 (COQ10) 100 MG CAPS Take 100 mg by mouth daily.   Marland Kitchen levETIRAcetam (KEPPRA) 500 MG tablet Take 1 tablet (500 mg total) by mouth 2 (two) times daily. (Patient taking differently: Take 500 mg by mouth 1 day or 1 dose. )  . Multiple Vitamin (MULITIVITAMIN WITH MINERALS) TABS Take 1 tablet by mouth daily.  Marland Kitchen telmisartan-hydrochlorothiazide (MICARDIS HCT) 80-25 MG tablet Take 1 tablet by mouth daily.   No facility-administered encounter medications on file as of 12/18/2016.     ALLERGIES:  No Known Allergies   PHYSICAL EXAM:  ECOG Performance status: 1 - Symptomatic, but independent.    Vitals:   12/18/16 0943  BP: 109/63  Pulse: 79  Resp: 14  Temp: 97.7 F (36.5 C)   Filed Weights   12/18/16 0943  Weight: 230 lb 12.8 oz (104.7 kg)    Physical Exam  Constitutional: He is oriented to person, place, and time and well-developed, well-nourished, and in no distress.  HENT:  Head: Normocephalic.  Mouth/Throat: Oropharynx is clear and moist. No oropharyngeal exudate.  Eyes: Pupils are equal, round, and reactive to light. Conjunctivae are normal. No scleral icterus.  Neck: Normal range of motion. Neck supple.  Cardiovascular: Regular rhythm and normal heart sounds.   Pulmonary/Chest: Effort normal and breath sounds normal. No respiratory distress. He has no wheezes. He has no rales.  Abdominal: Soft. Bowel sounds are normal. There is no tenderness. There is no rebound and no guarding.  Musculoskeletal: Normal range of motion. He exhibits no edema.  Lymphadenopathy:    He has cervical adenopathy (bilateral cervical adenopathy;  L > R).    He has no axillary adenopathy.       Right: No inguinal and no supraclavicular adenopathy present.       Left: No inguinal and no supraclavicular adenopathy present.  Neurological: He is alert and oriented to person, place, and time. No cranial nerve deficit. Gait normal.  Skin: Skin is warm and dry. No rash noted.  Psychiatric: Mood, memory, affect and judgment normal.  Nursing note and vitals reviewed.    LABORATORY DATA:  I have reviewed the labs as listed.  CBC    Component Value Date/Time   WBC 20.9 (H) 12/18/2016 0900   RBC 4.90 12/18/2016 0900   HGB 15.4 12/18/2016 0900   HCT 44.3 12/18/2016 0900   PLT 79 (L) 12/18/2016 0900   MCV 90.4 12/18/2016 0900   MCH 31.4 12/18/2016 0900   MCHC 34.8 12/18/2016 0900   RDW 14.7 12/18/2016 0900   LYMPHSABS 17.0 (H) 12/18/2016 0900   MONOABS 0.4 12/18/2016 0900   EOSABS 0.3 12/18/2016 0900   BASOSABS 0.0 12/18/2016 0900   CMP Latest Ref Rng & Units 12/18/2016 11/16/2016  10/16/2016  Glucose 65 - 99 mg/dL 105(H) 95 111(H)  BUN 6 - 20 mg/dL 24(H) 18 24(H)  Creatinine 0.61 - 1.24 mg/dL 1.26(H) 1.17 0.90  Sodium 135 - 145 mmol/L 137 141 137  Potassium 3.5 - 5.1 mmol/L 3.7 3.9 4.6  Chloride 101 - 111 mmol/L 103 104 107  CO2 22 - 32 mmol/L '27 29 24  ' Calcium 8.9 - 10.3 mg/dL 9.6 9.5 8.4(L)  Total Protein 6.5 - 8.1 g/dL 6.4(L) 6.2(L) -  Total Bilirubin 0.3 - 1.2 mg/dL 1.2 0.9 -  Alkaline Phos 38 - 126 U/L 59 52 -  AST 15 - 41 U/L 21 21 -  ALT 17 - 63 U/L 15(L) 16(L) -    PENDING LABS:    DIAGNOSTIC IMAGING:  *The following radiologic images and reports have been reviewed independently and agree with below findings.  CT neck: 08/03/16     CT chest/abd/pelvis: 08/03/16       MRI brain: 10/12/16      PATHOLOGY:  Bone marrow biopsy: 08/10/16          ASSESSMENT & PLAN:   CLL:  -CT neck/chest/abd/pelvis on 08/03/16 showed progressive adenopathy. 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 Progressive thrombocytopenia appreciated with platelets <100,000. Treatment initiated with Imbruvica 420 mg daily on 08/19/16.  Imbruvica stopped on 10/11/16 for subdural hematoma possibly secondary to fall in January 2018 vs. Effects of imbruvica.  - Leukocytosis is improved at this time.  - He continues to have thrombocytopenia which is new and started in June 2018. - Will d/c imbruvica permanently in light of the subdural hematoma. - Given his recent subdural hematoma, and stable blood counts, I will hold off on initiating treatment for his CLL at this time. We will plan to see him back in 3 months for follow up with labs. I have told him that should his thrombocytopenia worsen or leukocytosis worsen, we may consider restarting treatment with either bendamustin + rituxan or chlorambucil + obinutuzumab. Patient verbalized understanding and agreement.   Recent subdural hematoma s/p craniotomy/Headaches:  -Cont follow-up with Dr. Sherwood Gambler, neurosurgery. -  Weaning of keppra per Dr. Sherwood Gambler.     Dispo:  -Return to cancer center in 3 month to follow-up with labs. -I have advised patient to come back sooner should he have any other issues.   Orders Placed This Encounter  Procedures  .  CBC with Differential    Standing Status:   Future    Standing Expiration Date:   12/18/2017  . Comprehensive metabolic panel    Standing Status:   Future    Standing Expiration Date:   12/18/2017  . Lactate dehydrogenase    Standing Status:   Future    Standing Expiration Date:   12/18/2017    Twana First, MD

## 2016-12-19 ENCOUNTER — Encounter (HOSPITAL_COMMUNITY): Payer: Self-pay | Admitting: Adult Health

## 2016-12-25 ENCOUNTER — Ambulatory Visit (INDEPENDENT_AMBULATORY_CARE_PROVIDER_SITE_OTHER): Payer: Medicare HMO | Admitting: Cardiology

## 2016-12-25 ENCOUNTER — Encounter: Payer: Self-pay | Admitting: Cardiology

## 2016-12-25 ENCOUNTER — Other Ambulatory Visit (HOSPITAL_COMMUNITY): Payer: Self-pay | Admitting: Oncology

## 2016-12-25 VITALS — BP 115/70 | HR 79 | Ht 74.0 in | Wt 232.0 lb

## 2016-12-25 DIAGNOSIS — I4891 Unspecified atrial fibrillation: Secondary | ICD-10-CM

## 2016-12-25 DIAGNOSIS — C9110 Chronic lymphocytic leukemia of B-cell type not having achieved remission: Secondary | ICD-10-CM

## 2016-12-25 NOTE — Patient Instructions (Signed)
Medication Instructions:   Your physician recommends that you continue on your current medications as directed. Please refer to the Current Medication list given to you today.  Labwork:  none  Testing/Procedures:  none  Follow-Up:  Your physician recommends that you schedule a follow-up appointment in: 4 months.   Any Other Special Instructions Will Be Listed Below (If Applicable).  If you need a refill on your cardiac medications before your next appointment, please call your pharmacy. 

## 2016-12-25 NOTE — Progress Notes (Signed)
Clinical Summary Andrew [name] is a 76 y.o.male seen today for follow up of the following medical problems. This is a focused visit on his history of afib. For more detailed history refer to prior notes.    1. PAF - remote DCCV 15 years ago - per neurosurgery not a candidate for at least 2 months for antiplatelets or anticoag CHA2DS2 VASc score is at least 3, however given his subdural hematoma, he is not a candidate for anticoagulation at this time  - no recent palpitations since our last visit.   2. Subdural hematoma - s/p craniotomy with evacuation 10/14/16  3. CLL - followed by oncology  4. Thrombocytopenia - followed by heme, imbruvica was stopped - last platelts were 79  Past Medical History:  Diagnosis Date  . Atrial fibrillation ([place])   . CLL (chronic lymphocytic leukemia) ([place])   . Dysrhythmia    Hx. of Atrial Fibrillation- has resolved  . Hypertension   . Pinched nerve in neck      No Known Allergies   Current Outpatient Prescriptions  Medication Sig Dispense Refill  . acyclovir (ZOVIRAX) 400 MG tablet Take 1 tablet (400 mg total) by mouth 2 (two) times daily. 60 tablet 2  . amLODipine (NORVASC) 5 MG tablet Take 1 tablet (5 mg total) by mouth daily. 90 tablet 3  . carvedilol (COREG) 12.5 MG tablet Take 12.5 mg by mouth 2 (two) times daily with a meal.    . Coenzyme Q10 (COQ10) 100 MG CAPS Take 100 mg by mouth daily.     Marland Kitchen levETIRAcetam (KEPPRA) 500 MG tablet Take 1 tablet (500 mg total) by mouth 2 (two) times daily. (Patient taking differently: Take 500 mg by mouth 1 day or 1 dose. ) 60 tablet 2  . Multiple Vitamin (MULITIVITAMIN WITH MINERALS) TABS Take 1 tablet by mouth daily.    Marland Kitchen telmisartan-hydrochlorothiazide (MICARDIS HCT) 80-25 MG tablet Take 1 tablet by mouth daily.     No current facility-administered medications for this visit.      Past Surgical History:  Procedure Laterality Date  . CATARACT EXTRACTION, BILATERAL    . COLONOSCOPY   07/10/2011   Procedure: COLONOSCOPY;  Surgeon: Dorothyann Peng, MD;  [facility];  Service: Endoscopy;  Laterality: N/A;  10:30 AM  . CRANIOTOMY N/A 10/14/2016   Procedure: CRANIOTOMY HEMATOMA EVACUATION SUBDURAL;  Surgeon: Jovita Gamma, MD;  Location: [facility];  Service: Neurosurgery;  Laterality: N/A;  CRANIOTOMY HEMATOMA EVACUATION SUBDURAL  . THYROIDECTOMY, PARTIAL    . TONSILLECTOMY       No Known Allergies    Family History  Problem Relation Age of Onset  . CAD Father 53  . Cancer Mother   . Alzheimer's disease Mother   . Colon cancer Neg Hx      Social History Andrew [name] reports that he has quit smoking. He has a 15.00 pack-year smoking history. He has never used smokeless tobacco. Andrew [name] reports that he does not drink alcohol.   Review of Systems CONSTITUTIONAL: No weight loss, fever, chills, weakness or fatigue.  HEENT: Eyes: No visual loss, blurred vision, double vision or yellow sclerae.No hearing loss, sneezing, congestion, runny nose or sore throat.  SKIN: No rash or itching.  CARDIOVASCULAR: per hpi RESPIRATORY: No shortness of breath, cough or sputum.  GASTROINTESTINAL: No anorexia, nausea, vomiting or diarrhea. No abdominal pain or blood.  GENITOURINARY: No burning on urination, no polyuria NEUROLOGICAL: No headache, dizziness, syncope, paralysis, ataxia, numbness or tingling  in the extremities. No change in bowel or bladder control.  MUSCULOSKELETAL: No muscle, back pain, joint pain or stiffness.  LYMPHATICS: No enlarged nodes. No history of splenectomy.  PSYCHIATRIC: No history of depression or anxiety.  ENDOCRINOLOGIC: No reports of sweating, cold or heat intolerance. No polyuria or polydipsia.  Marland Kitchen   Physical Examination Vitals:   12/25/16 1402  BP: 115/70  Pulse: 79   Vitals:   12/25/16 1402  Weight: 232 lb (105.2 kg)  Height: 6\' 2"  (1.88 m)    Gen: resting comfortably, no acute distress HEENT: no scleral icterus, pupils equal  round and reactive, no palptable cervical adenopathy,  CV: RRR, no m/rg, no jvd Resp: Clear to auscultation bilaterally GI: abdomen is soft, non-tender, non-distended, normal bowel sounds, no hepatosplenomegaly MSK: extremities are warm, no edema.  Skin: warm, no rash Neuro:  no focal deficits Psych: appropriate affect   Diagnostic Studies 09/2016 echo Study Conclusions  - Left ventricle: The cavity size was normal. There was mild concentric hypertrophy. Systolic function was normal. The estimated ejection fraction was in the range of 55% to 60%. Wall motion was normal; there were no regional wall motion abnormalities. - Aortic valve: There was trivial regurgitation. - Left atrium: The atrium was mildly dilated.    Assessment and Plan  1. PAF - no symptoms, continue current meds - remains off anticoag after recent subdural hematoma. He also has thrombocytopenia that is limiting our ability to restart - will need both clearance from neurosurgery and normalized platelets prior to restarting anticoag. Continue to monitor at this time      [name], M.D

## 2017-01-01 ENCOUNTER — Other Ambulatory Visit (HOSPITAL_COMMUNITY): Payer: Self-pay | Admitting: Oncology

## 2017-01-01 DIAGNOSIS — C9110 Chronic lymphocytic leukemia of B-cell type not having achieved remission: Secondary | ICD-10-CM

## 2017-01-05 ENCOUNTER — Other Ambulatory Visit (HOSPITAL_COMMUNITY): Payer: Self-pay | Admitting: Oncology

## 2017-01-05 DIAGNOSIS — C9110 Chronic lymphocytic leukemia of B-cell type not having achieved remission: Secondary | ICD-10-CM

## 2017-01-08 DIAGNOSIS — E782 Mixed hyperlipidemia: Secondary | ICD-10-CM | POA: Diagnosis not present

## 2017-01-08 DIAGNOSIS — R7301 Impaired fasting glucose: Secondary | ICD-10-CM | POA: Diagnosis not present

## 2017-01-11 DIAGNOSIS — R944 Abnormal results of kidney function studies: Secondary | ICD-10-CM | POA: Diagnosis not present

## 2017-01-11 DIAGNOSIS — E782 Mixed hyperlipidemia: Secondary | ICD-10-CM | POA: Diagnosis not present

## 2017-01-11 DIAGNOSIS — R7301 Impaired fasting glucose: Secondary | ICD-10-CM | POA: Diagnosis not present

## 2017-01-11 DIAGNOSIS — D696 Thrombocytopenia, unspecified: Secondary | ICD-10-CM | POA: Diagnosis not present

## 2017-01-11 DIAGNOSIS — Z6830 Body mass index (BMI) 30.0-30.9, adult: Secondary | ICD-10-CM | POA: Diagnosis not present

## 2017-01-11 DIAGNOSIS — M7981 Nontraumatic hematoma of soft tissue: Secondary | ICD-10-CM | POA: Diagnosis not present

## 2017-01-11 DIAGNOSIS — I482 Chronic atrial fibrillation: Secondary | ICD-10-CM | POA: Diagnosis not present

## 2017-01-11 DIAGNOSIS — C9110 Chronic lymphocytic leukemia of B-cell type not having achieved remission: Secondary | ICD-10-CM | POA: Diagnosis not present

## 2017-01-11 DIAGNOSIS — I10 Essential (primary) hypertension: Secondary | ICD-10-CM | POA: Diagnosis not present

## 2017-02-02 DIAGNOSIS — H16223 Keratoconjunctivitis sicca, not specified as Sjogren's, bilateral: Secondary | ICD-10-CM | POA: Diagnosis not present

## 2017-02-13 DIAGNOSIS — Z87828 Personal history of other (healed) physical injury and trauma: Secondary | ICD-10-CM | POA: Diagnosis not present

## 2017-02-13 DIAGNOSIS — Z9889 Other specified postprocedural states: Secondary | ICD-10-CM | POA: Diagnosis not present

## 2017-02-13 DIAGNOSIS — Z5181 Encounter for therapeutic drug level monitoring: Secondary | ICD-10-CM | POA: Diagnosis not present

## 2017-03-19 DIAGNOSIS — X32XXXD Exposure to sunlight, subsequent encounter: Secondary | ICD-10-CM | POA: Diagnosis not present

## 2017-03-19 DIAGNOSIS — D040 Carcinoma in situ of skin of lip: Secondary | ICD-10-CM | POA: Diagnosis not present

## 2017-03-19 DIAGNOSIS — L570 Actinic keratosis: Secondary | ICD-10-CM | POA: Diagnosis not present

## 2017-03-20 ENCOUNTER — Encounter: Payer: Self-pay | Admitting: Cardiology

## 2017-03-22 ENCOUNTER — Encounter (HOSPITAL_COMMUNITY): Payer: Medicare HMO | Attending: Adult Health | Admitting: Adult Health

## 2017-03-22 ENCOUNTER — Encounter (HOSPITAL_COMMUNITY): Payer: Self-pay | Admitting: Adult Health

## 2017-03-22 ENCOUNTER — Encounter (HOSPITAL_COMMUNITY): Payer: Medicare HMO | Attending: Oncology

## 2017-03-22 VITALS — BP 109/55 | HR 64 | Temp 97.6°F | Resp 16 | Ht 74.0 in | Wt 233.5 lb

## 2017-03-22 DIAGNOSIS — Z23 Encounter for immunization: Secondary | ICD-10-CM

## 2017-03-22 DIAGNOSIS — R599 Enlarged lymph nodes, unspecified: Secondary | ICD-10-CM

## 2017-03-22 DIAGNOSIS — C9110 Chronic lymphocytic leukemia of B-cell type not having achieved remission: Secondary | ICD-10-CM | POA: Diagnosis not present

## 2017-03-22 LAB — CBC WITH DIFFERENTIAL/PLATELET
BASOS PCT: 0 %
Basophils Absolute: 0.1 10*3/uL (ref 0.0–0.1)
EOS ABS: 0.2 10*3/uL (ref 0.0–0.7)
Eosinophils Relative: 1 %
HCT: 44.8 % (ref 39.0–52.0)
HEMOGLOBIN: 15.5 g/dL (ref 13.0–17.0)
Lymphocytes Relative: 91 %
Lymphs Abs: 47.3 10*3/uL — ABNORMAL HIGH (ref 0.7–4.0)
MCH: 32.5 pg (ref 26.0–34.0)
MCHC: 34.6 g/dL (ref 30.0–36.0)
MCV: 93.9 fL (ref 78.0–100.0)
Monocytes Absolute: 0.6 10*3/uL (ref 0.1–1.0)
Monocytes Relative: 1 %
NEUTROS ABS: 3.7 10*3/uL (ref 1.7–7.7)
NEUTROS PCT: 7 %
PLATELETS: 79 10*3/uL — AB (ref 150–400)
RBC: 4.77 MIL/uL (ref 4.22–5.81)
RDW: 12.9 % (ref 11.5–15.5)
WBC: 51.9 10*3/uL — AB (ref 4.0–10.5)

## 2017-03-22 LAB — COMPREHENSIVE METABOLIC PANEL
ALBUMIN: 4.4 g/dL (ref 3.5–5.0)
ALK PHOS: 52 U/L (ref 38–126)
ALT: 18 U/L (ref 17–63)
AST: 25 U/L (ref 15–41)
Anion gap: 8 (ref 5–15)
BUN: 25 mg/dL — AB (ref 6–20)
CHLORIDE: 103 mmol/L (ref 101–111)
CO2: 27 mmol/L (ref 22–32)
CREATININE: 1.4 mg/dL — AB (ref 0.61–1.24)
Calcium: 9.7 mg/dL (ref 8.9–10.3)
GFR calc Af Amer: 55 mL/min — ABNORMAL LOW (ref >60)
GFR calc non Af Amer: 47 mL/min — ABNORMAL LOW (ref >60)
GLUCOSE: 126 mg/dL — AB (ref 65–99)
Potassium: 3.9 mmol/L (ref 3.5–5.1)
SODIUM: 138 mmol/L (ref 135–145)
Total Bilirubin: 1.4 mg/dL — ABNORMAL HIGH (ref 0.3–1.2)
Total Protein: 6.8 g/dL (ref 6.5–8.1)

## 2017-03-22 LAB — LACTATE DEHYDROGENASE: LDH: 131 U/L (ref 98–192)

## 2017-03-22 MED ORDER — INFLUENZA VAC SPLIT HIGH-DOSE 0.5 ML IM SUSY
0.5000 mL | PREFILLED_SYRINGE | Freq: Once | INTRAMUSCULAR | Status: AC
  Administered 2017-03-22: 0.5 mL via INTRAMUSCULAR
  Filled 2017-03-22: qty 0.5

## 2017-03-22 NOTE — Progress Notes (Signed)
Pt given flu shot in left deltoid. Pt tolerated well. Pt stable and discharged home ambulatory with wife.

## 2017-03-22 NOTE — Progress Notes (Signed)
[name], [place] [address]   CLINIC:  Medical Oncology/Hematology  PCP:  [name], [address] [place] [address] [phone]   REASON FOR VISIT:  Follow-up for CLL  CURRENT THERAPY: Previously on Imbruvica, which was stopped in 09/2016 s/p subdural hematoma. Now on observation   BRIEF ONCOLOGIC HISTORY:    CLL (chronic lymphocytic leukemia) ([place])   03/09/2015 Initial Diagnosis    CLL (chronic lymphocytic leukemia) ([place])     08/03/2016 Imaging    CT neck- 1. Diffuse and bulky lymphadenopathy in keeping with history of CLL. Nodal enlargement has diffusely progressed since April 2016. 2. Chest and abdomen CT reported separately.      08/03/2016 Imaging    CT CAP- 1. Bulky lymphadenopathy in the chest, abdomen, and pelvis as described. 2. Splenomegaly. 3. Coronary artery and thoracoabdominal aortic atherosclerosis. 4. Prostatomegaly. 5. Several tiny lucent lesions in the bony anatomic pelvis, nonspecific, but attention on follow-up imaging recommended.      08/10/2016 Procedure    Bone marrow aspiration and biopsy by IR.      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 PERIPHERAL BLOOD: - CHRONIC LYMPHOCYTIC LEUKEMIA. - THROMBOCYTOPENIA.      08/11/2016 Pathology Results    Bone Marrow Flow Cytometry - CHRONIC LYMPHOCYTIC LEUKEMIA/SMALL LYMPHOCYTIC LYMPHOMA.      08/16/2016 Pathology Results    Normal cytogenetics.  Molecular cytogenetic analysis by [facility] demonstrates a 13q-      08/19/2016 -  Chemotherapy    Imbruvica 420 mg daily         HISTORY OF PRESENT ILLNESS:  (From Kirby Crigler, PA-C's last note on 09/04/16)     INTERVAL HISTORY:  Andrew [name] 76 y.o. male returns for follow-up for CLL.   He was previously on Imbruvica, which was held in 09/2016 s/p subdural hematoma;  therapy was permanently discontinued in 11/2016 d/t concerns for increased risk of bleeding after hematoma.   Overall, he tells me he has been feeling very well.  Appetite and energy levels both 100%. He saw his neurologist a few months ago; he has been "cleared" from their standpoint and released from follow-up. He denies any new headaches or concerning neurologic complaints.    He feels like the adenopathy in his neck may be a bit worse since his last visit.  Denies any associated pain, shortness of breath or difficulty breathing. Denies any active bleeding episodes or abnormal bruising. Denies any night sweats, fevers, or recent infections. Weight is stable.   He is back and work and doing well; much of his work is outside. He has seen his dermatologist recently and has had several "spots burned off" on his face/lip. He wears sunscreen regularly.   He has not had his flu shot yet for this season; he would like to receive vaccine here today.   Otherwise, he is largely without complaints today.       REVIEW OF SYSTEMS:  Review of Systems  Constitutional: Negative.  Negative for chills, fatigue and fever.  HENT:   Positive for lump/mass. Negative for nosebleeds.   Eyes: Negative.   Respiratory: Negative.  Negative for cough and shortness of breath.   Cardiovascular: Negative.  Negative for chest pain and leg swelling.  Gastrointestinal: Negative.  Negative for abdominal pain, blood in stool, constipation, diarrhea, nausea and vomiting.  Endocrine: Negative.   Genitourinary:  Negative.  Negative for dysuria and hematuria.   Musculoskeletal: Negative.  Negative for arthralgias.  Skin: Negative.  Negative for rash.  Neurological: Negative.  Negative for dizziness and headaches.  Hematological: Positive for adenopathy. Does not bruise/bleed easily.  Psychiatric/Behavioral: Negative.  Negative for depression and sleep disturbance. The patient is not nervous/anxious.      PAST  MEDICAL/SURGICAL HISTORY:  Past Medical History:  Diagnosis Date  . Atrial fibrillation ([place])   . CLL (chronic lymphocytic leukemia) ([place])   . Dysrhythmia    Hx. of Atrial Fibrillation- has resolved  . Hypertension   . Pinched nerve in neck    Past Surgical History:  Procedure Laterality Date  . CATARACT EXTRACTION, BILATERAL    . COLONOSCOPY  07/10/2011   Procedure: COLONOSCOPY;  Surgeon: Dorothyann Peng, MD;  [facility];  Service: Endoscopy;  Laterality: N/A;  10:30 AM  . CRANIOTOMY N/A 10/14/2016   Procedure: CRANIOTOMY HEMATOMA EVACUATION SUBDURAL;  Surgeon: Jovita Gamma, MD;  Location: [facility];  Service: Neurosurgery;  Laterality: N/A;  CRANIOTOMY HEMATOMA EVACUATION SUBDURAL  . THYROIDECTOMY, PARTIAL    . TONSILLECTOMY       SOCIAL HISTORY:  Social History   Social History  . Marital status: Married    Spouse name: N/A  . Number of children: N/A  . Years of education: N/A   Occupational History  . Not on file.   Social History Main Topics  . Smoking status: Former Smoker    Packs/day: 1.00    Years: 15.00    Quit date: 01/26/1971  . Smokeless tobacco: Never Used  . Alcohol use No  . Drug use: No  . Sexual activity: Not on file   Other Topics Concern  . Not on file   Social History Narrative  . No narrative on file    FAMILY HISTORY:  Family History  Problem Relation Age of Onset  . CAD Father 38  . Cancer Mother   . Alzheimer's disease Mother   . Colon cancer Neg Hx     CURRENT MEDICATIONS:  Outpatient Encounter Prescriptions as of 03/22/2017  Medication Sig  . carvedilol (COREG) 12.5 MG tablet Take 12.5 mg by mouth 2 (two) times daily with a meal.  . Coenzyme Q10 (COQ10) 100 MG CAPS Take 100 mg by mouth daily.   . Multiple Vitamin (MULITIVITAMIN WITH MINERALS) TABS Take 1 tablet by mouth daily.  Marland Kitchen telmisartan-hydrochlorothiazide (MICARDIS HCT) 80-25 MG tablet Take 1 tablet by mouth daily.  Marland Kitchen amLODipine (NORVASC) 5 MG tablet Take 1  tablet (5 mg total) by mouth daily.  . [DISCONTINUED] acyclovir (ZOVIRAX) 400 MG tablet TAKE 1 TABLET BY MOUTH TWICE DAILY  . [DISCONTINUED] levETIRAcetam (KEPPRA) 500 MG tablet Take 1 tablet (500 mg total) by mouth 2 (two) times daily. (Patient taking differently: Take 500 mg by mouth daily. )  . [EXPIRED] Influenza vac split quadrivalent PF (FLUZONE HIGH-DOSE) injection 0.5 mL    No facility-administered encounter medications on file as of 03/22/2017.     ALLERGIES:  No Known Allergies   PHYSICAL EXAM:  ECOG Performance status: 1 - Symptomatic, but independent.   Vitals:   03/22/17 0919  BP: (!) 109/55  Pulse: 64  Resp: 16  Temp: 97.6 F (36.4 C)  SpO2: 96%   Filed Weights   03/22/17 0919  Weight: 233 lb 8 oz (105.9 kg)    Physical Exam  Constitutional: He is oriented to person, place, and time and well-developed, well-nourished, and in no distress.  HENT:  Head: Normocephalic.  Mouth/Throat: Oropharynx is clear and moist. No oropharyngeal exudate.  Eyes: Pupils are equal, round, and reactive to light. Conjunctivae are normal. No scleral icterus.  Cardiovascular: Normal rate and regular rhythm.   Pulmonary/Chest: Effort normal and breath sounds normal. No respiratory distress. He has no wheezes.  Abdominal: Soft. Bowel sounds are normal. There is no tenderness.  Musculoskeletal: Normal range of motion. He exhibits no edema.  Lymphadenopathy:    He has cervical adenopathy (Bilat cervical adenopathy at level 3 and posterior cervical adenopathy; largest approx 2 cm. Left neck adenopathy > right).    He has no axillary adenopathy.       Right: No supraclavicular adenopathy present.       Left: No supraclavicular adenopathy present.  Neurological: He is alert and oriented to person, place, and time. No cranial nerve deficit. Gait normal.  Skin: Skin is warm and dry. No rash noted.  Several scabbed lesions on his face and lip s/p visit with dermatologist to have "suspicious  spots" removed.  They are in varying stages of healing.   Psychiatric: Mood, memory, affect and judgment normal.  Nursing note and vitals reviewed.    LABORATORY DATA:  I have reviewed the labs as listed.  CBC    Component Value Date/Time   WBC 51.9 (HH) 03/22/2017 0846   RBC 4.77 03/22/2017 0846   HGB 15.5 03/22/2017 0846   HCT 44.8 03/22/2017 0846   PLT 79 (L) 03/22/2017 0846   MCV 93.9 03/22/2017 0846   MCH 32.5 03/22/2017 0846   MCHC 34.6 03/22/2017 0846   RDW 12.9 03/22/2017 0846   LYMPHSABS 47.3 (H) 03/22/2017 0846   MONOABS 0.6 03/22/2017 0846   EOSABS 0.2 03/22/2017 0846   BASOSABS 0.1 03/22/2017 0846   CMP Latest Ref Rng & Units 03/22/2017 12/18/2016 11/16/2016  Glucose 65 - 99 mg/dL 126(H) 105(H) 95  BUN 6 - 20 mg/dL 25(H) 24(H) 18  Creatinine 0.61 - 1.24 mg/dL 1.40(H) 1.26(H) 1.17  Sodium 135 - 145 mmol/L 138 137 141  Potassium 3.5 - 5.1 mmol/L 3.9 3.7 3.9  Chloride 101 - 111 mmol/L 103 103 104  CO2 22 - 32 mmol/L '27 27 29  ' Calcium 8.9 - 10.3 mg/dL 9.7 9.6 9.5  Total Protein 6.5 - 8.1 g/dL 6.8 6.4(L) 6.2(L)  Total Bilirubin 0.3 - 1.2 mg/dL 1.4(H) 1.2 0.9  Alkaline Phos 38 - 126 U/L 52 59 52  AST 15 - 41 U/L '25 21 21  ' ALT 17 - 63 U/L 18 15(L) 16(L)    PENDING LABS:    DIAGNOSTIC IMAGING:  *The following radiologic images and reports have been reviewed independently and agree with below findings.  CT neck: 08/03/16     CT chest/abd/pelvis: 08/03/16        PATHOLOGY:  Bone marrow biopsy: 08/10/16          ASSESSMENT & PLAN:   CLL:  -CT neck/chest/abd/pelvis on 08/03/16 showed progressive adenopathy. 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 Progressive thrombocytopenia appreciated with platelets <100,000. Treatment initiated with Imbruvica 420 mg daily on 08/19/16.  Treatment stopped in 09/2016 after patient had subdural hematoma requiring craniotomy; permanently discontinued by Dr. Talbert Cage in 11/2016 d/t concerns for increased risk of bleeding  with Imbruvica s/p hematoma.   -He has been on observation since that time.  Was noted to have new thrombocytopenia in 10/2016 with plt count 81,000. Leukocytosis noted to be improved.  -Labs today reviewed in detail. WBCs alert 51.9 and platelets  79,000.  Discussed with Dr. Talbert Cage. His WBCs are markedly elevated from previous, but thrombocytopenia is stable. His cervical adenopathy is slightly enlarged from previous, but he is largely asymptomatic. Dr. Talbert Cage recommends continued observation vs resuming treatment.  When the time comes to restart treatment, Dr. Talbert Cage recommends either Bendamustin/Rituxan or Chlorambucil/Obinutuzumab.   -Return to cancer center in 3 months for follow-up with labs.   Health maintenance/Wellness:   -Flu vaccine given today.  -Continue follow-up with dermatology, as directed.  -Maintain follow-up with PCP as directed.       Dispo:  -Return to cancer center in 3 months for follow-up with labs.    All questions were answered to patient's stated satisfaction. Encouraged patient to call with any new concerns or questions before his next visit to the cancer center and we can certain see him sooner, if needed.    Plan of care discussed with Dr. Talbert Cage, who agrees with the above aforementioned.    Orders placed this encounter:  No orders of the defined types were placed in this encounter.     Mike Craze, NP [place] [phone]

## 2017-03-22 NOTE — Progress Notes (Signed)
CRITICAL VALUE ALERT  Critical Value:  WBC 51.9  Date & Time Notied:  03/22/2017 @ 5749  Provider Notified: Mike Craze, NP  Orders Received/Actions taken: none at this time

## 2017-04-05 DIAGNOSIS — H52223 Regular astigmatism, bilateral: Secondary | ICD-10-CM | POA: Diagnosis not present

## 2017-04-05 DIAGNOSIS — H5211 Myopia, right eye: Secondary | ICD-10-CM | POA: Diagnosis not present

## 2017-04-05 DIAGNOSIS — H02401 Unspecified ptosis of right eyelid: Secondary | ICD-10-CM | POA: Diagnosis not present

## 2017-04-05 DIAGNOSIS — H524 Presbyopia: Secondary | ICD-10-CM | POA: Diagnosis not present

## 2017-04-26 DIAGNOSIS — Z08 Encounter for follow-up examination after completed treatment for malignant neoplasm: Secondary | ICD-10-CM | POA: Diagnosis not present

## 2017-04-26 DIAGNOSIS — Z85828 Personal history of other malignant neoplasm of skin: Secondary | ICD-10-CM | POA: Diagnosis not present

## 2017-05-07 ENCOUNTER — Ambulatory Visit: Payer: Medicare HMO | Admitting: Cardiology

## 2017-05-31 ENCOUNTER — Ambulatory Visit: Payer: Medicare HMO | Admitting: Cardiology

## 2017-05-31 ENCOUNTER — Encounter: Payer: Self-pay | Admitting: Cardiology

## 2017-05-31 VITALS — BP 144/66 | HR 70 | Ht 73.5 in | Wt 237.0 lb

## 2017-05-31 DIAGNOSIS — I10 Essential (primary) hypertension: Secondary | ICD-10-CM | POA: Diagnosis not present

## 2017-05-31 DIAGNOSIS — I4891 Unspecified atrial fibrillation: Secondary | ICD-10-CM | POA: Diagnosis not present

## 2017-05-31 NOTE — Progress Notes (Signed)
Clinical Summary Andrew [name] is a 77 y.o.male seen today for follow up of the following medical problems.   1. PAF - remote DCCV 15 years ago - per neurosurgery not a candidate for at least 2 months for antiplatelets or anticoag CHA2DS2 VASc score is at least 3, however given his subdural hematoma, he is not a candidate for anticoagulation at this time  - no recent palpitations - compliant with meds. Remains off anticoag due to prior SDH and low platelets.    2. Subdural hematoma - s/p craniotomy with evacuation 10/14/16  3. CLL - followed by oncology  4. Thrombocytopenia - followed by heme, imbruvica was stopped - last platelts were 79  5. HTN - he remains compliant with meds  6. History of cardiomyopathy -previous records are at [facility] - he reports being told several year ago that his LVEF was 35%, this was around the time of his first diagnosis of afib. - records are not available at this time - recent echo with normal LVEF 55-60%.   - denies any recent SOB/DOE/LE edema   Past Medical History:  Diagnosis Date  . Atrial fibrillation ([place])   . CLL (chronic lymphocytic leukemia) ([place])   . Dysrhythmia    Hx. of Atrial Fibrillation- has resolved  . Hypertension   . Pinched nerve in neck      No Known Allergies   Current Outpatient Medications  Medication Sig Dispense Refill  . amLODipine (NORVASC) 5 MG tablet Take 1 tablet (5 mg total) by mouth daily. 90 tablet 3  . carvedilol (COREG) 12.5 MG tablet Take 12.5 mg by mouth 2 (two) times daily with a meal.    . Coenzyme Q10 (COQ10) 100 MG CAPS Take 100 mg by mouth daily.     . Multiple Vitamin (MULITIVITAMIN WITH MINERALS) TABS Take 1 tablet by mouth daily.    Marland Kitchen telmisartan-hydrochlorothiazide (MICARDIS HCT) 80-25 MG tablet Take 1 tablet by mouth daily.     No current facility-administered medications for this visit.      Past Surgical History:  Procedure Laterality Date  .  CATARACT EXTRACTION, BILATERAL    . COLONOSCOPY  07/10/2011   Procedure: COLONOSCOPY;  Surgeon: Dorothyann Peng, MD;  [facility];  Service: Endoscopy;  Laterality: N/A;  10:30 AM  . CRANIOTOMY N/A 10/14/2016   Procedure: CRANIOTOMY HEMATOMA EVACUATION SUBDURAL;  Surgeon: Jovita Gamma, MD;  Location: [facility];  Service: Neurosurgery;  Laterality: N/A;  CRANIOTOMY HEMATOMA EVACUATION SUBDURAL  . THYROIDECTOMY, PARTIAL    . TONSILLECTOMY       No Known Allergies    Family History  Problem Relation Age of Onset  . CAD Father 48  . Cancer Mother   . Alzheimer's disease Mother   . Colon cancer Neg Hx      Social History Andrew [name] reports that he quit smoking about 46 years ago. He has a 15.00 pack-year smoking history. he has never used smokeless tobacco. Andrew [name] reports that he does not drink alcohol.   Review of Systems CONSTITUTIONAL: No weight loss, fever, chills, weakness or fatigue.  HEENT: Eyes: No visual loss, blurred vision, double vision or yellow sclerae.No hearing loss, sneezing, congestion, runny nose or sore throat.  SKIN: No rash or itching.  CARDIOVASCULAR: per hpi RESPIRATORY: per hpi GASTROINTESTINAL: No anorexia, nausea, vomiting or diarrhea. No abdominal pain or blood.  GENITOURINARY: No burning on urination, no polyuria NEUROLOGICAL: No headache, dizziness, syncope, paralysis, ataxia, numbness  or tingling in the extremities. No change in bowel or bladder control.  MUSCULOSKELETAL: No muscle, back pain, joint pain or stiffness.  LYMPHATICS: No enlarged nodes. No history of splenectomy.  PSYCHIATRIC: No history of depression or anxiety.  ENDOCRINOLOGIC: No reports of sweating, cold or heat intolerance. No polyuria or polydipsia.  Marland Kitchen   Physical Examination Vitals:   05/31/17 0818  BP: (!) 144/66  Pulse: 70  SpO2: 95%   Filed Weights   05/31/17 0818  Weight: 237 lb (107.5 kg)    Gen: resting comfortably, no acute distress HEENT: no  scleral icterus, pupils equal round and reactive, no palptable cervical adenopathy,  CV: RRR, no m/r/g, noo jvd Resp: Clear to auscultation bilaterally GI: abdomen is soft, non-tender, non-distended, normal bowel sounds, no hepatosplenomegaly MSK: extremities are warm, no edema.  Skin: warm, no rash Neuro:  no focal deficits Psych: appropriate affect   Diagnostic Studies  09/2016 echo Study Conclusions  - Left ventricle: The cavity size was normal. There was mild concentric hypertrophy. Systolic function was normal. The estimated ejection fraction was in the range of 55% to 60%. Wall motion was normal; there were no regional wall motion abnormalities. - Aortic valve: There was trivial regurgitation. - Left atrium: The atrium was mildly dilated.   Assessment and Plan   1. PAF - asymptomatic, continue to monitor - no anticoag due to prior SDH and low platelets. If platelsts normalize could consider discussion with neuro if ok to restart  2. HTN - bp elevated by dynamap, at goal based on manual bp 130/80 - continue current meds  F/u 4 months   [name], M.D.

## 2017-05-31 NOTE — Patient Instructions (Signed)
Medication Instructions:  Your physician recommends that you continue on your current medications as directed. Please refer to the Current Medication list given to you today.   Labwork: NONE  Testing/Procedures: NONE  Follow-Up: Your physician wants you to follow-up in: 4 MONTHS.  You will receive a reminder letter in the mail two months in advance. If you don't receive a letter, please call our office to schedule the follow-up appointment.   Any Other Special Instructions Will Be Listed Below (If Applicable).     If you need a refill on your cardiac medications before your next appointment, please call your pharmacy.

## 2017-06-06 ENCOUNTER — Encounter: Payer: Self-pay | Admitting: Cardiology

## 2017-06-21 ENCOUNTER — Inpatient Hospital Stay (HOSPITAL_COMMUNITY): Payer: Medicare HMO | Attending: Oncology

## 2017-06-21 DIAGNOSIS — R232 Flushing: Secondary | ICD-10-CM | POA: Diagnosis not present

## 2017-06-21 DIAGNOSIS — Z87891 Personal history of nicotine dependence: Secondary | ICD-10-CM | POA: Insufficient documentation

## 2017-06-21 DIAGNOSIS — R161 Splenomegaly, not elsewhere classified: Secondary | ICD-10-CM | POA: Insufficient documentation

## 2017-06-21 DIAGNOSIS — C9190 Lymphoid leukemia, unspecified not having achieved remission: Secondary | ICD-10-CM | POA: Insufficient documentation

## 2017-06-21 DIAGNOSIS — Z9221 Personal history of antineoplastic chemotherapy: Secondary | ICD-10-CM | POA: Diagnosis not present

## 2017-06-21 DIAGNOSIS — I10 Essential (primary) hypertension: Secondary | ICD-10-CM | POA: Diagnosis not present

## 2017-06-21 DIAGNOSIS — R5383 Other fatigue: Secondary | ICD-10-CM | POA: Insufficient documentation

## 2017-06-21 DIAGNOSIS — Z9049 Acquired absence of other specified parts of digestive tract: Secondary | ICD-10-CM | POA: Diagnosis not present

## 2017-06-21 DIAGNOSIS — N400 Enlarged prostate without lower urinary tract symptoms: Secondary | ICD-10-CM | POA: Insufficient documentation

## 2017-06-21 DIAGNOSIS — Z79899 Other long term (current) drug therapy: Secondary | ICD-10-CM | POA: Diagnosis not present

## 2017-06-21 DIAGNOSIS — C9110 Chronic lymphocytic leukemia of B-cell type not having achieved remission: Secondary | ICD-10-CM

## 2017-06-21 LAB — COMPREHENSIVE METABOLIC PANEL
ALT: 21 U/L (ref 17–63)
ANION GAP: 12 (ref 5–15)
AST: 30 U/L (ref 15–41)
Albumin: 4.4 g/dL (ref 3.5–5.0)
Alkaline Phosphatase: 52 U/L (ref 38–126)
BUN: 34 mg/dL — AB (ref 6–20)
CHLORIDE: 103 mmol/L (ref 101–111)
CO2: 24 mmol/L (ref 22–32)
Calcium: 10.4 mg/dL — ABNORMAL HIGH (ref 8.9–10.3)
Creatinine, Ser: 1.83 mg/dL — ABNORMAL HIGH (ref 0.61–1.24)
GFR calc Af Amer: 39 mL/min — ABNORMAL LOW (ref >60)
GFR, EST NON AFRICAN AMERICAN: 34 mL/min — AB (ref >60)
Glucose, Bld: 201 mg/dL — ABNORMAL HIGH (ref 65–99)
POTASSIUM: 3.9 mmol/L (ref 3.5–5.1)
Sodium: 139 mmol/L (ref 135–145)
TOTAL PROTEIN: 6.8 g/dL (ref 6.5–8.1)
Total Bilirubin: 1 mg/dL (ref 0.3–1.2)

## 2017-06-21 LAB — CBC WITH DIFFERENTIAL/PLATELET
BAND NEUTROPHILS: 1 %
BASOS ABS: 0 10*3/uL (ref 0.0–0.1)
Basophils Relative: 0 %
Blasts: 0 %
EOS PCT: 1 %
Eosinophils Absolute: 0.7 10*3/uL (ref 0.0–0.7)
HCT: 44.5 % (ref 39.0–52.0)
Hemoglobin: 14.8 g/dL (ref 13.0–17.0)
LYMPHS ABS: 63.2 10*3/uL — AB (ref 0.7–4.0)
Lymphocytes Relative: 88 %
MCH: 31.1 pg (ref 26.0–34.0)
MCHC: 33.3 g/dL (ref 30.0–36.0)
MCV: 93.5 fL (ref 78.0–100.0)
METAMYELOCYTES PCT: 0 %
MONOS PCT: 1 %
Monocytes Absolute: 0.7 10*3/uL (ref 0.1–1.0)
Myelocytes: 0 %
NEUTROS ABS: 7.2 10*3/uL (ref 1.7–7.7)
Neutrophils Relative %: 9 %
OTHER: 0 %
Platelets: 87 10*3/uL — ABNORMAL LOW (ref 150–400)
Promyelocytes Absolute: 0 %
RBC: 4.76 MIL/uL (ref 4.22–5.81)
RDW: 12.9 % (ref 11.5–15.5)
WBC: 71.8 10*3/uL (ref 4.0–10.5)
nRBC: 0 /100 WBC

## 2017-06-21 LAB — LACTATE DEHYDROGENASE: LDH: 143 U/L (ref 98–192)

## 2017-06-21 NOTE — Progress Notes (Signed)
CRITICAL VALUE ALERT Critical value received:  WBC 72.0 Date of notification:  06/21/2017 Time of notification: 0910 Critical value read back:  Yes.   Nurse who received alert:  Joanne Gavel RN MD notified (1st page):  Mike Craze NP

## 2017-06-28 ENCOUNTER — Inpatient Hospital Stay (HOSPITAL_BASED_OUTPATIENT_CLINIC_OR_DEPARTMENT_OTHER): Payer: Medicare HMO | Admitting: Adult Health

## 2017-06-28 ENCOUNTER — Other Ambulatory Visit: Payer: Self-pay

## 2017-06-28 ENCOUNTER — Ambulatory Visit (HOSPITAL_COMMUNITY): Payer: Medicare HMO | Admitting: Internal Medicine

## 2017-06-28 ENCOUNTER — Encounter (HOSPITAL_COMMUNITY): Payer: Self-pay | Admitting: Adult Health

## 2017-06-28 VITALS — BP 109/73 | HR 70 | Temp 97.9°F | Resp 18 | Wt 232.5 lb

## 2017-06-28 DIAGNOSIS — C9110 Chronic lymphocytic leukemia of B-cell type not having achieved remission: Secondary | ICD-10-CM

## 2017-06-28 DIAGNOSIS — R5383 Other fatigue: Secondary | ICD-10-CM

## 2017-06-28 DIAGNOSIS — Z79899 Other long term (current) drug therapy: Secondary | ICD-10-CM

## 2017-06-28 DIAGNOSIS — Z9049 Acquired absence of other specified parts of digestive tract: Secondary | ICD-10-CM

## 2017-06-28 DIAGNOSIS — C9190 Lymphoid leukemia, unspecified not having achieved remission: Secondary | ICD-10-CM

## 2017-06-28 DIAGNOSIS — R232 Flushing: Secondary | ICD-10-CM

## 2017-06-28 DIAGNOSIS — N400 Enlarged prostate without lower urinary tract symptoms: Secondary | ICD-10-CM | POA: Diagnosis not present

## 2017-06-28 DIAGNOSIS — I10 Essential (primary) hypertension: Secondary | ICD-10-CM

## 2017-06-28 DIAGNOSIS — Z87891 Personal history of nicotine dependence: Secondary | ICD-10-CM

## 2017-06-28 DIAGNOSIS — R161 Splenomegaly, not elsewhere classified: Secondary | ICD-10-CM

## 2017-06-28 DIAGNOSIS — Z9221 Personal history of antineoplastic chemotherapy: Secondary | ICD-10-CM | POA: Diagnosis not present

## 2017-06-28 NOTE — Progress Notes (Signed)
[place], [place] [address]   CLINIC:  Medical Oncology/Hematology  PCP:  [name], MD [place] [address] [phone]   REASON FOR VISIT:  Follow-up for CLL  CURRENT THERAPY: Previously on Imbruvica, which was stopped in 09/2016 s/p subdural hematoma. Now on observation   BRIEF ONCOLOGIC HISTORY:    CLL (chronic lymphocytic leukemia) ([place])   03/09/2015 Initial Diagnosis    CLL (chronic lymphocytic leukemia) ([place])      08/03/2016 Imaging    CT neck- 1. Diffuse and bulky lymphadenopathy in keeping with history of CLL. Nodal enlargement has diffusely progressed since April 2016. 2. Chest and abdomen CT reported separately.      08/03/2016 Imaging    CT CAP- 1. Bulky lymphadenopathy in the chest, abdomen, and pelvis as described. 2. Splenomegaly. 3. Coronary artery and thoracoabdominal aortic atherosclerosis. 4. Prostatomegaly. 5. Several tiny lucent lesions in the bony anatomic pelvis, nonspecific, but attention on follow-up imaging recommended.      08/10/2016 Procedure    Bone marrow aspiration and biopsy by IR.      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 PERIPHERAL BLOOD: - CHRONIC LYMPHOCYTIC LEUKEMIA. - THROMBOCYTOPENIA.      08/11/2016 Pathology Results    Bone Marrow Flow Cytometry - CHRONIC LYMPHOCYTIC LEUKEMIA/SMALL LYMPHOCYTIC LYMPHOMA.      08/16/2016 Pathology Results    Normal cytogenetics.  Molecular cytogenetic analysis by [facility] demonstrates a 13q-      08/19/2016 -  Chemotherapy    Imbruvica 420 mg daily         HISTORY OF PRESENT ILLNESS:  (From Kirby Crigler, PA-C's last note on 09/04/16)      INTERVAL HISTORY:  Andrew [name] 77 y.o. male returns for follow-up for CLL.   He was previously on Imbruvica, which was held in 09/2016 s/p subdural  hematoma; therapy was permanently discontinued in 11/2016 d/t concerns for increased risk of bleeding after hematoma.   Here today with his wife.   Overall, he tells me he has been feeling very well.  Appetite and energy levels both 100%.  His wife tells me that they recently helped their daughter move into a new home "and he worked really hard. It seems like it took him much longer to regain his energy and strength after doing all of that work."  He tells me he has had a bit more intermittent fatigue in recent months, "but it's not too bad."    He feels like the lumps in his neck "might be a little bigger, but I don't think they're that much bigger."  Denies drenching night sweats; endorses occasional hot flashes at night, but no drenching night sweats.  Denies any fevers, infections, bleeding, or abnormal bruising.    Denies any headaches, dizziness, or other neurologic complaints. Denies any cough, shortness of breath, chest pain, or leg swelling. Denies any changes in bowel or bladder.  His weight is stable.    Otherwise, he is largely without complaints today.     REVIEW OF SYSTEMS:  ROS Per HPI. 14-ROS completed and negative except as noted above.    PAST MEDICAL/SURGICAL HISTORY:  Past Medical History:  Diagnosis Date  . Atrial fibrillation ([place])   . CLL (chronic lymphocytic leukemia) ([place])   . Dysrhythmia    Hx. of Atrial Fibrillation- has resolved  . Hypertension   .  Pinched nerve in neck    Past Surgical History:  Procedure Laterality Date  . CATARACT EXTRACTION, BILATERAL    . COLONOSCOPY  07/10/2011   Procedure: COLONOSCOPY;  Surgeon: Dorothyann Peng, MD;  [facility];  Service: Endoscopy;  Laterality: N/A;  10:30 AM  . CRANIOTOMY N/A 10/14/2016   Procedure: CRANIOTOMY HEMATOMA EVACUATION SUBDURAL;  Surgeon: Jovita Gamma, MD;  Location: [facility];  Service: Neurosurgery;  Laterality: N/A;  CRANIOTOMY HEMATOMA EVACUATION SUBDURAL  . THYROIDECTOMY, PARTIAL    .  TONSILLECTOMY       SOCIAL HISTORY:  Social History   Socioeconomic History  . Marital status: Married    Spouse name: Not on file  . Number of children: Not on file  . Years of education: Not on file  . Highest education level: Not on file  Social Needs  . Financial resource strain: Not on file  . Food insecurity - worry: Not on file  . Food insecurity - inability: Not on file  . Transportation needs - medical: Not on file  . Transportation needs - non-medical: Not on file  Occupational History  . Not on file  Tobacco Use  . Smoking status: Former Smoker    Packs/day: 1.00    Years: 15.00    Pack years: 15.00    Last attempt to quit: 01/26/1971    Years since quitting: 46.4  . Smokeless tobacco: Never Used  Substance and Sexual Activity  . Alcohol use: No  . Drug use: No  . Sexual activity: Not on file  Other Topics Concern  . Not on file  Social History Narrative  . Not on file    FAMILY HISTORY:  Family History  Problem Relation Age of Onset  . CAD Father 40  . Cancer Mother   . Alzheimer's disease Mother   . Colon cancer Neg Hx     CURRENT MEDICATIONS:  Outpatient Encounter Medications as of 06/28/2017  Medication Sig  . carvedilol (COREG) 12.5 MG tablet Take 12.5 mg by mouth 2 (two) times daily with a meal.  . Coenzyme Q10 (COQ10) 100 MG CAPS Take 100 mg by mouth daily.   . Multiple Vitamin (MULITIVITAMIN WITH MINERALS) TABS Take 1 tablet by mouth daily.  Marland Kitchen telmisartan-hydrochlorothiazide (MICARDIS HCT) 80-25 MG tablet Take 1 tablet by mouth daily.  Marland Kitchen amLODipine (NORVASC) 5 MG tablet Take 1 tablet (5 mg total) by mouth daily.   No facility-administered encounter medications on file as of 06/28/2017.     ALLERGIES:  No Known Allergies   PHYSICAL EXAM:  ECOG Performance status: 1 - Symptomatic, but independent.   Vitals:   06/28/17 1005  BP: 109/73  Pulse: 70  Resp: 18  Temp: 97.9 F (36.6 C)  SpO2: 94%   Filed Weights   06/28/17 1005    Weight: 232 lb 8 oz (105.5 kg)    Physical Exam  Constitutional: He is oriented to person, place, and time and well-developed, well-nourished, and in no distress.  HENT:  Head: Normocephalic.  Mouth/Throat: Oropharynx is clear and moist. No oropharyngeal exudate.  Eyes: Conjunctivae are normal. Pupils are equal, round, and reactive to light. No scleral icterus.  Neck: Normal range of motion. Neck supple.  Submental adenopathy, measuring ~4 cm (mildly larger than last exam)  Cardiovascular: Normal rate and regular rhythm.  Pulmonary/Chest: Effort normal and breath sounds normal. No respiratory distress.  Abdominal: Soft. Bowel sounds are normal. There is no tenderness.  Musculoskeletal: Normal range of motion. He exhibits  no edema.  Lymphadenopathy:    He has cervical adenopathy (Bilateral cervical adenopathy noted to anterior and posterior cervical chains; largest measuring ~2-3 cm. (slightly larger than last exam)).    He has no axillary adenopathy.       Right: No inguinal and no supraclavicular adenopathy present.       Left: No inguinal and no supraclavicular adenopathy present.  Neurological: He is alert and oriented to person, place, and time. No cranial nerve deficit. Gait normal.  Skin: Skin is warm and dry. No rash noted.  Psychiatric: Mood, memory, affect and judgment normal.  Nursing note and vitals reviewed.    LABORATORY DATA:  I have reviewed the labs as listed.  CBC    Component Value Date/Time   WBC 71.8 (HH) 06/21/2017 0828   RBC 4.76 06/21/2017 0828   HGB 14.8 06/21/2017 0828   HCT 44.5 06/21/2017 0828   PLT 87 (L) 06/21/2017 0828   MCV 93.5 06/21/2017 0828   MCH 31.1 06/21/2017 0828   MCHC 33.3 06/21/2017 0828   RDW 12.9 06/21/2017 0828   LYMPHSABS 63.2 (H) 06/21/2017 0828   MONOABS 0.7 06/21/2017 0828   EOSABS 0.7 06/21/2017 0828   BASOSABS 0.0 06/21/2017 0828   CMP Latest Ref Rng & Units 06/21/2017 03/22/2017 12/18/2016  Glucose 65 - 99 mg/dL 201(H)  126(H) 105(H)  BUN 6 - 20 mg/dL 34(H) 25(H) 24(H)  Creatinine 0.61 - 1.24 mg/dL 1.83(H) 1.40(H) 1.26(H)  Sodium 135 - 145 mmol/L 139 138 137  Potassium 3.5 - 5.1 mmol/L 3.9 3.9 3.7  Chloride 101 - 111 mmol/L 103 103 103  CO2 22 - 32 mmol/L '24 27 27  ' Calcium 8.9 - 10.3 mg/dL 10.4(H) 9.7 9.6  Total Protein 6.5 - 8.1 g/dL 6.8 6.8 6.4(L)  Total Bilirubin 0.3 - 1.2 mg/dL 1.0 1.4(H) 1.2  Alkaline Phos 38 - 126 U/L 52 52 59  AST 15 - 41 U/L '30 25 21  ' ALT 17 - 63 U/L 21 18 15(L)    PENDING LABS:    DIAGNOSTIC IMAGING:  *The following radiologic images and reports have been reviewed independently and agree with below findings.  CT neck: 08/03/16     CT chest/abd/pelvis: 08/03/16        PATHOLOGY:  Bone marrow biopsy: 08/10/16          ASSESSMENT & PLAN:   CLL:  -CT neck/chest/abd/pelvis on 08/03/16 showed progressive adenopathy. 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 Progressive thrombocytopenia appreciated with platelets <100,000. Treatment initiated with Imbruvica 420 mg daily on 08/19/16.  Treatment stopped in 09/2016 after patient had subdural hematoma requiring craniotomy; permanently discontinued by Dr. Talbert Cage in 11/2016 d/t concerns for increased risk of bleeding with Imbruvica s/p hematoma.  He has been on observation since that time.    -Labs reviewed today in detail with the patient (labs from 06/21/17). WBCs markedly elevated at 71.8 (up from 51.9 on 03/22/17).  Hemoglobin is within normal limits.  Platelet count is stable at 87,000 (mildly up from 79,000 and on 03/22/17). While his WBCs are more elevated than previous,  thrombocytopenia is stable. No anemia appreciated.  On physical exam, lymphadenopathy may be mildly more enlarged, but is overall stable. He is without any B symptoms and clinically feels well.  Would recommend continued close observation at this time. Shared with him that we are likely approaching the time when resuming treatment will be recommended.   Gave him strict instructions to call us if he has any progressive/new symptoms and/or  further enlargement of neck adenopathy. He voiced understanding.  -When it comes time to resume treatment, would recommend Bendamustin/Rituxan or Chlorambucil/Obinutuzumab. -Will bring him back in ~6 weeks for follow-up with MD with labs.   Elevated BUN/CRE:  -Encouraged him to increase fluids as tolerated.        Dispo:  -Return to cancer center in ~6 weeks for follow-up with MD with labs.    All questions were answered to patient's stated satisfaction. Encouraged patient to call with any new concerns or questions before his next visit to the cancer center and we can certain see him sooner, if needed.       Orders placed this encounter:  Orders Placed This Encounter  Procedures  . CBC with Differential/Platelet  . Comprehensive metabolic panel      Mike Craze, NP [place] [phone]

## 2017-06-28 NOTE — Patient Instructions (Signed)
[facility] at [facility] Discharge Instructions  RECOMMENDATIONS MADE BY THE CONSULTANT AND ANY TEST RESULTS WILL BE SENT TO YOUR REFERRING PHYSICIAN.  You were seen today by Gretchen Dawson, NP  See schedulers up front for appointments   Thank you for choosing [facility] at [facility] to provide your oncology and hematology care.  To afford each patient quality time with our provider, please arrive at least 15 minutes before your scheduled appointment time.    If you have a lab appointment with the Cancer Center please come in thru the  Main Entrance and check in at the main information desk  You need to re-schedule your appointment should you arrive 10 or more minutes late.  We strive to give you quality time with our providers, and arriving late affects you and other patients whose appointments are after yours.  Also, if you no show three or more times for appointments you may be dismissed from the clinic at the providers discretion.     Again, thank you for choosing [facility].  Our hope is that these requests will decrease the amount of time that you wait before being seen by our physicians.       _____________________________________________________________  Should you have questions after your visit to [facility], please contact our office at [phone] between the hours of 8:30 a.m. and 4:30 p.m.  Voicemails left after 4:30 p.m. will not be returned until the following business day.  For prescription refill requests, have your pharmacy contact our office.       Resources For Cancer Patients and their Caregivers ? American Cancer Society: Can assist with transportation, wigs, general needs, runs Look Good Feel Better.        1-888-227-6333 ? Cancer Care: Provides financial assistance, online support groups, medication/co-pay assistance.  1-800-813-HOPE (4673) ? [facility] Assists [place] cancer patients and their families through emotional , educational and financial support.  336-427-4357 ? [place] DSS Where to apply for food stamps, Medicaid and utility assistance. 336-342-1394 ? [facility]: Transportation to medical appointments. 336-347-2287 ? Social Security Administration: May apply for disability if have a Stage IV cancer. 336-342-7796 1-800-772-1213 ? [place] Aging, Disability and Transit Services: Assists with nutrition, care and transit needs. 336-349-2343  Cancer Center Support Programs: @10RELATIVEDAYS@ > Cancer Support Group  2nd Tuesday of the month 1pm-2pm, Journey Room  > Creative Journey  3rd Tuesday of the month 1130am-1pm, Journey Room  > Look Good Feel Better  1st Wednesday of the month 10am-12 noon, Journey Room (Call American Cancer Society to register 1-800-395-5775)    

## 2017-07-16 DIAGNOSIS — R7301 Impaired fasting glucose: Secondary | ICD-10-CM | POA: Diagnosis not present

## 2017-07-16 DIAGNOSIS — D696 Thrombocytopenia, unspecified: Secondary | ICD-10-CM | POA: Diagnosis not present

## 2017-07-16 DIAGNOSIS — I10 Essential (primary) hypertension: Secondary | ICD-10-CM | POA: Diagnosis not present

## 2017-07-16 DIAGNOSIS — E782 Mixed hyperlipidemia: Secondary | ICD-10-CM | POA: Diagnosis not present

## 2017-07-19 ENCOUNTER — Other Ambulatory Visit (HOSPITAL_COMMUNITY): Payer: Self-pay | Admitting: Internal Medicine

## 2017-07-19 DIAGNOSIS — H02843 Edema of right eye, unspecified eyelid: Secondary | ICD-10-CM | POA: Diagnosis not present

## 2017-07-19 DIAGNOSIS — I482 Chronic atrial fibrillation: Secondary | ICD-10-CM | POA: Diagnosis not present

## 2017-07-19 DIAGNOSIS — N400 Enlarged prostate without lower urinary tract symptoms: Secondary | ICD-10-CM | POA: Diagnosis not present

## 2017-07-19 DIAGNOSIS — C9110 Chronic lymphocytic leukemia of B-cell type not having achieved remission: Secondary | ICD-10-CM | POA: Diagnosis not present

## 2017-07-19 DIAGNOSIS — I10 Essential (primary) hypertension: Secondary | ICD-10-CM | POA: Diagnosis not present

## 2017-07-19 DIAGNOSIS — R5383 Other fatigue: Secondary | ICD-10-CM | POA: Diagnosis not present

## 2017-07-19 DIAGNOSIS — D696 Thrombocytopenia, unspecified: Secondary | ICD-10-CM | POA: Diagnosis not present

## 2017-07-19 DIAGNOSIS — R7301 Impaired fasting glucose: Secondary | ICD-10-CM | POA: Diagnosis not present

## 2017-07-19 DIAGNOSIS — R944 Abnormal results of kidney function studies: Secondary | ICD-10-CM | POA: Diagnosis not present

## 2017-07-19 DIAGNOSIS — Z8679 Personal history of other diseases of the circulatory system: Secondary | ICD-10-CM | POA: Diagnosis not present

## 2017-07-19 DIAGNOSIS — N289 Disorder of kidney and ureter, unspecified: Secondary | ICD-10-CM

## 2017-07-25 DIAGNOSIS — R05 Cough: Secondary | ICD-10-CM | POA: Diagnosis not present

## 2017-07-25 DIAGNOSIS — R001 Bradycardia, unspecified: Secondary | ICD-10-CM | POA: Diagnosis not present

## 2017-07-25 DIAGNOSIS — R062 Wheezing: Secondary | ICD-10-CM | POA: Diagnosis not present

## 2017-07-25 DIAGNOSIS — D696 Thrombocytopenia, unspecified: Secondary | ICD-10-CM | POA: Diagnosis not present

## 2017-07-25 DIAGNOSIS — C9110 Chronic lymphocytic leukemia of B-cell type not having achieved remission: Secondary | ICD-10-CM | POA: Diagnosis not present

## 2017-07-25 DIAGNOSIS — R944 Abnormal results of kidney function studies: Secondary | ICD-10-CM | POA: Diagnosis not present

## 2017-07-25 DIAGNOSIS — I10 Essential (primary) hypertension: Secondary | ICD-10-CM | POA: Diagnosis not present

## 2017-07-25 DIAGNOSIS — G589 Mononeuropathy, unspecified: Secondary | ICD-10-CM | POA: Diagnosis not present

## 2017-07-25 DIAGNOSIS — R7301 Impaired fasting glucose: Secondary | ICD-10-CM | POA: Diagnosis not present

## 2017-07-25 DIAGNOSIS — I482 Chronic atrial fibrillation: Secondary | ICD-10-CM | POA: Diagnosis not present

## 2017-07-26 ENCOUNTER — Emergency Department (HOSPITAL_COMMUNITY)
Admission: EM | Admit: 2017-07-26 | Discharge: 2017-07-27 | Disposition: A | Discharge status: Home / Self Care | Payer: Medicare HMO | Attending: Emergency Medicine | Admitting: Emergency Medicine

## 2017-07-26 ENCOUNTER — Ambulatory Visit (HOSPITAL_COMMUNITY)
Admission: RE | Admit: 2017-07-26 | Discharge: 2017-07-26 | Disposition: A | Discharge status: Home / Self Care | Payer: Medicare HMO | Source: Ambulatory Visit | Attending: Internal Medicine | Admitting: Internal Medicine

## 2017-07-26 DIAGNOSIS — K7689 Other specified diseases of liver: Secondary | ICD-10-CM | POA: Insufficient documentation

## 2017-07-26 DIAGNOSIS — Z79899 Other long term (current) drug therapy: Secondary | ICD-10-CM | POA: Diagnosis not present

## 2017-07-26 DIAGNOSIS — R0602 Shortness of breath: Secondary | ICD-10-CM | POA: Diagnosis not present

## 2017-07-26 DIAGNOSIS — Z87891 Personal history of nicotine dependence: Secondary | ICD-10-CM | POA: Diagnosis not present

## 2017-07-26 DIAGNOSIS — I10 Essential (primary) hypertension: Secondary | ICD-10-CM | POA: Insufficient documentation

## 2017-07-26 DIAGNOSIS — J181 Lobar pneumonia, unspecified organism: Secondary | ICD-10-CM | POA: Insufficient documentation

## 2017-07-26 DIAGNOSIS — J189 Pneumonia, unspecified organism: Secondary | ICD-10-CM | POA: Diagnosis not present

## 2017-07-26 DIAGNOSIS — Z856 Personal history of leukemia: Secondary | ICD-10-CM | POA: Diagnosis not present

## 2017-07-26 DIAGNOSIS — R944 Abnormal results of kidney function studies: Secondary | ICD-10-CM

## 2017-07-26 DIAGNOSIS — R05 Cough: Secondary | ICD-10-CM | POA: Diagnosis not present

## 2017-07-26 DIAGNOSIS — R161 Splenomegaly, not elsewhere classified: Secondary | ICD-10-CM

## 2017-07-26 DIAGNOSIS — R6883 Chills (without fever): Secondary | ICD-10-CM | POA: Diagnosis not present

## 2017-07-26 DIAGNOSIS — N289 Disorder of kidney and ureter, unspecified: Secondary | ICD-10-CM

## 2017-07-26 NOTE — ED Triage Notes (Signed)
Pt reports having generalized weakness, just not feeling well with cough and upper resp complaints, seen at his pmd on Wednesday and was started on z-pac and inhaler, mucinex and zyrtec.  Pt states his breathing feels worse.

## 2017-07-27 ENCOUNTER — Other Ambulatory Visit: Payer: Self-pay

## 2017-07-27 ENCOUNTER — Emergency Department (HOSPITAL_COMMUNITY): Payer: Medicare HMO

## 2017-07-27 ENCOUNTER — Encounter (HOSPITAL_COMMUNITY): Payer: Self-pay

## 2017-07-27 DIAGNOSIS — R05 Cough: Secondary | ICD-10-CM | POA: Diagnosis not present

## 2017-07-27 DIAGNOSIS — Z79899 Other long term (current) drug therapy: Secondary | ICD-10-CM | POA: Diagnosis not present

## 2017-07-27 DIAGNOSIS — Z87891 Personal history of nicotine dependence: Secondary | ICD-10-CM | POA: Diagnosis not present

## 2017-07-27 DIAGNOSIS — I10 Essential (primary) hypertension: Secondary | ICD-10-CM | POA: Diagnosis not present

## 2017-07-27 DIAGNOSIS — Z856 Personal history of leukemia: Secondary | ICD-10-CM | POA: Diagnosis not present

## 2017-07-27 DIAGNOSIS — J181 Lobar pneumonia, unspecified organism: Secondary | ICD-10-CM | POA: Diagnosis not present

## 2017-07-27 LAB — CBC WITH DIFFERENTIAL/PLATELET
BASOS PCT: 0 %
Basophils Absolute: 0 10*3/uL (ref 0.0–0.1)
EOS ABS: 0.1 10*3/uL (ref 0.0–0.7)
EOS PCT: 0 %
HCT: 38 % — ABNORMAL LOW (ref 39.0–52.0)
Hemoglobin: 12.6 g/dL — ABNORMAL LOW (ref 13.0–17.0)
Lymphocytes Relative: 88 %
Lymphs Abs: 46 10*3/uL (ref 0.7–4.0)
MCH: 31.3 pg (ref 26.0–34.0)
MCHC: 33.2 g/dL (ref 30.0–36.0)
MCV: 94.5 fL (ref 78.0–100.0)
MONO ABS: 0.8 10*3/uL (ref 0.1–1.0)
Monocytes Relative: 2 %
Neutro Abs: 5.3 10*3/uL (ref 1.7–7.7)
Neutrophils Relative %: 10 %
Platelets: 74 10*3/uL — ABNORMAL LOW (ref 150–400)
RBC: 4.02 MIL/uL — ABNORMAL LOW (ref 4.22–5.81)
RDW: 13.5 % (ref 11.5–15.5)
WBC: 52.2 10*3/uL (ref 4.0–10.5)

## 2017-07-27 LAB — BASIC METABOLIC PANEL
Anion gap: 11 (ref 5–15)
BUN: 26 mg/dL — AB (ref 6–20)
CALCIUM: 8.9 mg/dL (ref 8.9–10.3)
CO2: 20 mmol/L — ABNORMAL LOW (ref 22–32)
CREATININE: 2.03 mg/dL — AB (ref 0.61–1.24)
Chloride: 103 mmol/L (ref 101–111)
GFR calc non Af Amer: 30 mL/min — ABNORMAL LOW (ref >60)
GFR, EST AFRICAN AMERICAN: 35 mL/min — AB (ref >60)
GLUCOSE: 112 mg/dL — AB (ref 65–99)
Potassium: 3.8 mmol/L (ref 3.5–5.1)
Sodium: 134 mmol/L — ABNORMAL LOW (ref 135–145)

## 2017-07-27 MED ORDER — SODIUM CHLORIDE 0.9 % IV SOLN
INTRAVENOUS | Status: AC
  Filled 2017-07-27: qty 10

## 2017-07-27 MED ORDER — DOXYCYCLINE HYCLATE 100 MG PO CAPS: 100.0000 mg | ORAL_CAPSULE | Freq: Two times a day (BID) | ORAL | 0 refills | Status: DC

## 2017-07-27 MED ORDER — DOXYCYCLINE HYCLATE 100 MG PO TABS
ORAL_TABLET | ORAL | Status: AC
  Filled 2017-07-27: qty 1

## 2017-07-27 MED ORDER — ALBUTEROL SULFATE (2.5 MG/3ML) 0.083% IN NEBU
INHALATION_SOLUTION | RESPIRATORY_TRACT | Status: AC
  Filled 2017-07-27: qty 6

## 2017-07-27 MED ORDER — ALBUTEROL SULFATE (2.5 MG/3ML) 0.083% IN NEBU
5.0000 mg | INHALATION_SOLUTION | Freq: Once | RESPIRATORY_TRACT | Status: AC
  Administered 2017-07-27: 5 mg via RESPIRATORY_TRACT

## 2017-07-27 MED ORDER — DOXYCYCLINE HYCLATE 100 MG PO TABS
100.0000 mg | ORAL_TABLET | Freq: Once | ORAL | Status: AC
  Administered 2017-07-27: 100 mg via ORAL

## 2017-07-27 MED ORDER — SODIUM CHLORIDE 0.9 % IV SOLN
1.0000 g | Freq: Once | INTRAVENOUS | Status: AC
  Administered 2017-07-27: 1 g via INTRAVENOUS

## 2017-07-27 MED ORDER — IPRATROPIUM-ALBUTEROL 0.5-2.5 (3) MG/3ML IN SOLN
3.0000 mL | Freq: Once | RESPIRATORY_TRACT | Status: AC
  Administered 2017-07-27: 3 mL via RESPIRATORY_TRACT
  Filled 2017-07-27: qty 3

## 2017-07-27 MED ORDER — AMOXICILLIN-POT CLAVULANATE 875-125 MG PO TABS: 1.0000 | ORAL_TABLET | Freq: Two times a day (BID) | ORAL | 0 refills | Status: DC

## 2017-07-27 NOTE — ED Provider Notes (Signed)
[facility] EMERGENCY DEPARTMENT Provider Note   CSN: [id number] Arrival date & time: 07/26/17  2348     History   Chief Complaint Chief Complaint  Patient presents with  . Cough    generalized  weakness    HPI Andrew [name] is a 77 y.o. male.  The history is provided by the patient and the spouse.  Cough  This is a new problem. The current episode started more than 2 days ago. The problem occurs every few minutes. The problem has been gradually worsening. The cough is productive of sputum. The maximum temperature recorded prior to his arrival was 101 to 101.9 F. Associated symptoms include chills, shortness of breath and wheezing. Pertinent negatives include no chest pain. Treatments tried: Mucinex, Zyrtec, Z-Pak. The treatment provided no relief.  With significant history for CLL, atrial fibrillation, hypertension presents with cough.  He reports the cough started up to 4 days ago.  He reports fevers, myalgias, chills.  He reports seeing his primary physician on February 27 He was given cough medications as well as antibiotics, but he is not improving.  He reports continued cough and shortness of breath.  He denies hemoptysis. He is not currently on therapy for CLL Past Medical History:  Diagnosis Date  . Atrial fibrillation ([place])   . Chronic lymphocytic leukemia ([place])   . CLL (chronic lymphocytic leukemia) ([place])   . Dysrhythmia    Hx. of Atrial Fibrillation- has resolved  . Hypertension   . Pinched nerve in neck     Patient Active Problem List   Diagnosis Date Noted  . Subdural hematoma ([place]) 10/12/2016  . A-fib ([place]) 10/12/2016  . Hypoxemia 05/13/2016  . Preventative health care 03/06/2016  . CLL (chronic lymphocytic leukemia) ([place]) 03/09/2015    Past Surgical History:  Procedure Laterality Date  . CATARACT EXTRACTION, BILATERAL    . COLONOSCOPY  07/10/2011   Procedure: COLONOSCOPY;  Surgeon: Dorothyann Peng, MD;  [facility];  Service: Endoscopy;   Laterality: N/A;  10:30 AM  . CRANIOTOMY N/A 10/14/2016   Procedure: CRANIOTOMY HEMATOMA EVACUATION SUBDURAL;  Surgeon: Jovita Gamma, MD;  Location: [place];  Service: Neurosurgery;  Laterality: N/A;  CRANIOTOMY HEMATOMA EVACUATION SUBDURAL  . THYROIDECTOMY, PARTIAL    . TONSILLECTOMY         Home Medications    Prior to Admission medications   Medication Sig Start Date End Date Taking? Authorizing Provider  amLODipine (NORVASC) 5 MG tablet Take 1 tablet (5 mg total) by mouth daily. 11/09/16 05/31/17  [name], MD  carvedilol (COREG) 12.5 MG tablet Take 12.5 mg by mouth 2 (two) times daily with a meal.    [provider]  Coenzyme Q10 (COQ10) 100 MG CAPS Take 100 mg by mouth daily.     [provider]  Multiple Vitamin (MULITIVITAMIN WITH MINERALS) TABS Take 1 tablet by mouth daily.    [provider]  telmisartan-hydrochlorothiazide (MICARDIS HCT) 80-25 MG tablet Take 1 tablet by mouth daily.    [provider]    Family History Family History  Problem Relation Age of Onset  . CAD Father 19  . Cancer Mother   . Alzheimer's disease Mother   . Colon cancer Neg Hx     Social History Social History   Tobacco Use  . Smoking status: Former Smoker    Packs/day: 1.00    Years: 15.00    Pack years: 15.00    Last attempt to quit: 01/26/1971  Years since quitting: 46.5  . Smokeless tobacco: Never Used  Substance Use Topics  . Alcohol use: No  . Drug use: No     Allergies   Patient has no known allergies.   Review of Systems Review of Systems  Constitutional: Positive for chills.  Respiratory: Positive for cough, shortness of breath and wheezing.   Cardiovascular: Negative for chest pain and leg swelling.  Gastrointestinal: Negative for vomiting.  Neurological: Negative for syncope.  All other systems reviewed and are negative.    Physical Exam Updated Vital Signs BP 117/66   Pulse 88   Temp (!) 97.4 F (36.3 C)  (Oral)   Resp 19   Ht 1.88 m (6\' 2" )   Wt 102.1 kg (225 lb)   SpO2 93%   BMI 28.89 kg/m   Physical Exam  CONSTITUTIONAL: Elderly HEAD: Normocephalic/atraumatic EYES: EOMI/PERRL ENMT: Mucous membranes moist NECK: supple no meningeal signs SPINE/BACK:entire spine nontender CV: S1/S2 noted, no murmurs/rubs/gallops noted LUNGS: Scattered wheezing bilaterally.  Crackles left base ABDOMEN: soft, nontender, no rebound or guarding, bowel sounds noted throughout abdomen GU:no cva tenderness NEURO: Pt is awake/alert/appropriate, moves all extremitiesx4.  No facial droop.   EXTREMITIES: pulses normal/equal, full ROM, no lower extremity edema SKIN: warm, color normal PSYCH: no abnormalities of mood noted, alert and oriented to situation  ED Treatments / Results  Labs (all labs ordered are listed, but only abnormal results are displayed) Labs Reviewed  BASIC METABOLIC PANEL - Abnormal; Notable for the following components:      Result Value   Sodium 134 (*)    CO2 20 (*)    Glucose, Bld 112 (*)    BUN 26 (*)    Creatinine, Ser 2.03 (*)    GFR calc non Af Amer 30 (*)    GFR calc Af Amer 35 (*)    All other components within normal limits  CBC WITH DIFFERENTIAL/PLATELET - Abnormal; Notable for the following components:   WBC 52.2 (*)    RBC 4.02 (*)    Hemoglobin 12.6 (*)    HCT 38.0 (*)    Platelets 74 (*)    All other components within normal limits  PATHOLOGIST SMEAR REVIEW    EKG  EKG Interpretation None       Radiology Dg Chest 2 View  Result Date: 07/27/2017 CLINICAL DATA:  Cough EXAM: CHEST  2 VIEW COMPARISON:  05/13/2016.  Chest CT 08/03/2016 FINDINGS: Mild hyperinflation. Cardiomegaly. Bilateral hilar prominence shown on prior CT to represent hilar adenopathy. Patchy opacity noted posteriorly on the lateral view concerning for pneumonia, likely in the left lower lobe. No acute bony abnormality. IMPRESSION: Mild cardiomegaly, hyperinflation. Bilateral hilar fullness  shown on prior CT to represent hilar adenopathy. Concern for left lower lobe pneumonia. Electronically Signed   By: Rolm Baptise M.D.   On: 07/27/2017 00:41   US Abdomen Complete  Result Date: 07/26/2017 CLINICAL DATA:  Abnormal renal function.  CLL. EXAM: ABDOMEN ULTRASOUND COMPLETE COMPARISON:  CT 08/03/2016. FINDINGS: Gallbladder: No gallstones or wall thickening visualized. No sonographic Murphy sign noted by sonographer. Common bile duct: Diameter: Mm Liver: Increased echogenicity consistent fatty infiltration and/or hepatocellular disease. 1.2 cm and adjacent 1.1 cm simple cyst in the left hepatic lobe. Portal vein is patent on color Doppler imaging with normal direction of blood flow towards the liver. IVC: No abnormality visualized. Pancreas: Visualized portion unremarkable. Spleen: Spleen measures 18.4 cm with a volume of 1708 cc. This is consistent splenomegaly. Right Kidney: Length: 12.3 cm.  Echogenicity within normal limits. No mass or hydronephrosis visualized. Left Kidney: Length: 12.2 cm. Echogenicity within normal limits. No mass or hydronephrosis visualized. Abdominal aorta: No aneurysm visualized. Other findings: None. IMPRESSION: 1. Increased hepatic echogenicity consistent fatty infiltration and/or hepatocellular disease. 2 small simple cyst noted in the left hepatic lobe. 2.  Splenomegaly with spleen measuring 18.4 cm. 3. No acute or focal renal abnormality identified. No hydronephrosis. Electronically Signed   By: Marcello Moores  Register   On: 07/26/2017 09:10    Procedures Procedures   Medications Ordered in ED Medications  albuterol (PROVENTIL) (2.5 MG/3ML) 0.083% nebulizer solution (  Canceled Entry 07/27/17 0111)  albuterol (PROVENTIL) (2.5 MG/3ML) 0.083% nebulizer solution 5 mg (5 mg Nebulization Given 07/27/17 0112)  cefTRIAXone (ROCEPHIN) 1 g in sodium chloride 0.9 % 100 mL IVPB (0 g Intravenous Stopped 07/27/17 0138)  doxycycline (VIBRA-TABS) tablet 100 mg (100 mg Oral Given 07/27/17  0104)  ipratropium-albuterol (DUONEB) 0.5-2.5 (3) MG/3ML nebulizer solution 3 mL (3 mLs Nebulization Given 07/27/17 0247)     Initial Impression / Assessment and Plan / ED Course  I have reviewed the triage vital signs and the nursing notes.  Pertinent labs & imaging results that were available during my care of the patient were reviewed by me and considered in my medical decision making (see chart for details).     1:07 AM Patient with upper respiratory infection for up to 4 days, now with worsening cough and shortness of breath.  Chest x-ray reveals left lower lobe pneumonia.  Will expand his antibiotics with Rocephin and doxycycline. Patient probably had influenza, but is out of the window of Tamiflu at this time    After monitoring in the ER for up to 3 hours, patient improved.  After nebulizers, his lung sounds improved, his wheezing is resolved.  He is in no respiratory distress.  He ambulated and reported he felt well, no tachypnea.  His oxygen ranged from 92-95%.  At rest and on room air his pulse ox was 95%  We had a long discussion about the next step.  I offered admission due to his previous medical conditions with associated pneumonia.  After discussion of risks and benefits with patient and his spouse, they would feel more comfortable actually going home at this time. At this time I feel this is reasonable and safe.  His work of breathing is improved.  He is not septic, and no hypoxia at this time. He will stop Z-Pak, and we will provide dual therapy with Augmentin and doxycycline. I discussed at length with patient and his wife strict ER return precautions.  He will need to follow-up with his PCP in 1-2 weeks weeks for repeat exam and x-ray. Final Clinical Impressions(s) / ED Diagnoses   Final diagnoses:  Community acquired pneumonia of left lower lobe of lung [facility])    ED Discharge Orders        Ordered    doxycycline (VIBRAMYCIN) 100 MG capsule  2 times daily      07/27/17 0320    amoxicillin-clavulanate (AUGMENTIN) 875-125 MG tablet  2 times daily     07/27/17 0320       Ripley Fraise, MD 07/27/17 0425

## 2017-07-27 NOTE — ED Notes (Signed)
Pt alert & oriented x4, stable gait. Patient given discharge instructions, paperwork & prescription(s). Patient  instructed to stop at the registration desk to finish any additional paperwork. Patient verbalized understanding. Pt left department w/ no further questions. 

## 2017-07-27 NOTE — ED Notes (Signed)
Pt ambulated around the nurses station x 2. O2 sats remained 92 to 95%. EDP notified.

## 2017-07-27 NOTE — Discharge Instructions (Signed)
As we discussed, you will need to have a repeat x-ray over the next 2-3 weeks.  Please follow-up with Dr. Nevada Crane for this.  If you have worsening breathing, difficulty speaking, new chest pain, new coughing up blood, please come back to the ER immediately  You can stop taking the Z-Pak

## 2017-07-28 ENCOUNTER — Encounter (HOSPITAL_COMMUNITY): Payer: Self-pay

## 2017-07-28 ENCOUNTER — Emergency Department (HOSPITAL_COMMUNITY): Payer: Medicare HMO

## 2017-07-28 ENCOUNTER — Emergency Department (HOSPITAL_COMMUNITY)
Admission: EM | Admit: 2017-07-28 | Discharge: 2017-07-28 | Disposition: A | Discharge status: Home / Self Care | Payer: Medicare HMO | Attending: Emergency Medicine | Admitting: Emergency Medicine

## 2017-07-28 DIAGNOSIS — J189 Pneumonia, unspecified organism: Secondary | ICD-10-CM | POA: Insufficient documentation

## 2017-07-28 DIAGNOSIS — Z87891 Personal history of nicotine dependence: Secondary | ICD-10-CM | POA: Insufficient documentation

## 2017-07-28 DIAGNOSIS — Z856 Personal history of leukemia: Secondary | ICD-10-CM | POA: Diagnosis not present

## 2017-07-28 DIAGNOSIS — I700 Atherosclerosis of aorta: Secondary | ICD-10-CM | POA: Diagnosis not present

## 2017-07-28 DIAGNOSIS — I10 Essential (primary) hypertension: Secondary | ICD-10-CM | POA: Diagnosis not present

## 2017-07-28 DIAGNOSIS — R042 Hemoptysis: Secondary | ICD-10-CM | POA: Diagnosis present

## 2017-07-28 DIAGNOSIS — Z79899 Other long term (current) drug therapy: Secondary | ICD-10-CM | POA: Insufficient documentation

## 2017-07-28 LAB — CBC WITH DIFFERENTIAL/PLATELET
BASOS ABS: 0 10*3/uL (ref 0.0–0.1)
Basophils Relative: 0 %
EOS ABS: 0 10*3/uL (ref 0.0–0.7)
Eosinophils Relative: 0 %
HCT: 38.6 % — ABNORMAL LOW (ref 39.0–52.0)
Hemoglobin: 12.6 g/dL — ABNORMAL LOW (ref 13.0–17.0)
LYMPHS ABS: 58 10*3/uL — AB (ref 0.7–4.0)
Lymphocytes Relative: 89 %
MCH: 30.9 pg (ref 26.0–34.0)
MCHC: 32.6 g/dL (ref 30.0–36.0)
MCV: 94.6 fL (ref 78.0–100.0)
MONO ABS: 1.3 10*3/uL — AB (ref 0.1–1.0)
Monocytes Relative: 2 %
NEUTROS PCT: 9 %
Neutro Abs: 5.9 10*3/uL (ref 1.7–7.7)
PLATELETS: 73 10*3/uL — AB (ref 150–400)
RBC: 4.08 MIL/uL — AB (ref 4.22–5.81)
RDW: 13.4 % (ref 11.5–15.5)
WBC: 65.2 10*3/uL — AB (ref 4.0–10.5)

## 2017-07-28 LAB — I-STAT CG4 LACTIC ACID, ED: LACTIC ACID, VENOUS: 0.58 mmol/L (ref 0.5–1.9)

## 2017-07-28 LAB — BASIC METABOLIC PANEL
Anion gap: 11 (ref 5–15)
BUN: 20 mg/dL (ref 6–20)
CHLORIDE: 105 mmol/L (ref 101–111)
CO2: 21 mmol/L — ABNORMAL LOW (ref 22–32)
CREATININE: 1.67 mg/dL — AB (ref 0.61–1.24)
Calcium: 9.2 mg/dL (ref 8.9–10.3)
GFR, EST AFRICAN AMERICAN: 44 mL/min — AB (ref >60)
GFR, EST NON AFRICAN AMERICAN: 38 mL/min — AB (ref >60)
Glucose, Bld: 100 mg/dL — ABNORMAL HIGH (ref 65–99)
Potassium: 3.8 mmol/L (ref 3.5–5.1)
SODIUM: 137 mmol/L (ref 135–145)

## 2017-07-28 MED ORDER — IPRATROPIUM-ALBUTEROL 0.5-2.5 (3) MG/3ML IN SOLN
3.0000 mL | Freq: Four times a day (QID) | RESPIRATORY_TRACT | Status: DC
  Administered 2017-07-28 (×2): 3 mL via RESPIRATORY_TRACT
  Filled 2017-07-28 (×2): qty 3

## 2017-07-28 MED ORDER — IOPAMIDOL (ISOVUE-370) INJECTION 76%
80.0000 mL | Freq: Once | INTRAVENOUS | Status: AC | PRN
  Administered 2017-07-28: 80 mL via INTRAVENOUS

## 2017-07-28 MED ORDER — SODIUM CHLORIDE 0.9 % IV BOLUS (SEPSIS)
500.0000 mL | Freq: Once | INTRAVENOUS | Status: AC
  Administered 2017-07-28: 500 mL via INTRAVENOUS

## 2017-07-28 NOTE — ED Notes (Signed)
EDP at bedside  

## 2017-07-28 NOTE — ED Notes (Signed)
Assisted pt to restroom  

## 2017-07-28 NOTE — ED Notes (Signed)
RT paged for neb tx.

## 2017-07-28 NOTE — ED Triage Notes (Signed)
Pt reports was diagnosed with pneumonia yesterday here in the ED. Pt says that afternoon he started coughing up pink tinged sputum.  Pt says is breathing better today but concerned about the blood.  Denies any pain.

## 2017-07-28 NOTE — ED Provider Notes (Signed)
[facility] EMERGENCY DEPARTMENT Provider Note   CSN: [id number] Arrival date & time: 07/28/17  3382     History   Chief Complaint Chief Complaint  Patient presents with  . coughing up blood    HPI Andrew [name] is a 77 y.o. male.   He is here for evaluation of blood in sputum with cough, associated with recent diagnosis and treatment for pneumonia.  He was here early yesterday morning, at that time his antibiotics were changed from azithromycin, to Augmentin plus doxycycline, for a left lower lobe pneumonia.  This followed an illness, about 4 days previously, he felt to be influenza.  Patient was instructed to return here if he developed bleeding with cough.  He reports that overall he is feeling better, he feels like "my color has returned", and he denies fever, chills, nausea or vomiting.  He came here ambulatory by private vehicle.  There are no other known modifying factors.  HPI  Past Medical History:  Diagnosis Date  . Atrial fibrillation ([place])   . Chronic lymphocytic leukemia ([place])   . CLL (chronic lymphocytic leukemia) ([place])   . Dysrhythmia    Hx. of Atrial Fibrillation- has resolved  . Hypertension   . Pinched nerve in neck     Patient Active Problem List   Diagnosis Date Noted  . 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 A-fib ([place]) 10/12/2016  . Hypoxemia 05/13/2016  . Preventative health care 03/06/2016  . CLL (chronic lymphocytic leukemia) ([place]) 03/09/2015    Past Surgical History:  Procedure Laterality Date  . CATARACT EXTRACTION, BILATERAL    . COLONOSCOPY  07/10/2011   Procedure: COLONOSCOPY;  Surgeon: Dorothyann Peng, MD;  [facility];  Service: Endoscopy;  Laterality: N/A;  10:30 AM  . CRANIOTOMY N/A 10/14/2016   Procedure: CRANIOTOMY HEMATOMA EVACUATION SUBDURAL;  Surgeon: Jovita Gamma, MD;  Location: [place];  Service: Neurosurgery;  Laterality: N/A;  CRANIOTOMY HEMATOMA EVACUATION SUBDURAL  . THYROIDECTOMY, PARTIAL    . TONSILLECTOMY           Home Medications    Prior to Admission medications   Medication Sig Start Date End Date Taking? Authorizing Provider  amLODipine (NORVASC) 5 MG tablet Take 1 tablet (5 mg total) by mouth daily. 11/09/16 07/28/17 Yes BranchAlphonse Guild, MD  amoxicillin-clavulanate (AUGMENTIN) 875-125 MG tablet Take 1 tablet by mouth 2 (two) times daily. One po bid x 7 days 07/27/17  Yes Ripley Fraise, MD  carvedilol (COREG) 12.5 MG tablet Take 12.5 mg by mouth 2 (two) times daily with a meal.   Yes [provider]  Coenzyme Q10 (COQ10) 100 MG CAPS Take 100 mg by mouth daily.    Yes [provider]  doxycycline (VIBRAMYCIN) 100 MG capsule Take 1 capsule (100 mg total) by mouth 2 (two) times daily. One po bid x 7 days 07/27/17  Yes Ripley Fraise, MD  Multiple Vitamin (MULITIVITAMIN WITH MINERALS) TABS Take 1 tablet by mouth daily.   Yes [provider]  tamsulosin (FLOMAX) 0.4 MG CAPS capsule Take 1 capsule by mouth daily. 07/19/17  Yes [provider]  telmisartan (MICARDIS) 80 MG tablet Take 1 tablet by mouth daily. 07/19/17  Yes [provider]    Family History Family History  Problem Relation Age of Onset  . CAD Father 8  . Cancer Mother   . Alzheimer's disease Mother   . Colon cancer Neg Hx     Social History Social History   Tobacco Use  .  Smoking status: Former Smoker    Packs/day: 1.00    Years: 15.00    Pack years: 15.00    Last attempt to quit: 01/26/1971    Years since quitting: 46.5  . Smokeless tobacco: Never Used  Substance Use Topics  . Alcohol use: No  . Drug use: No     Allergies   Patient has no known allergies.   Review of Systems Review of Systems  All other systems reviewed and are negative.    Physical Exam Updated Vital Signs BP 131/87   Pulse 86   Temp 98.5 F (36.9 C) (Oral)   Resp 19   Ht 6\' 2"  (1.88 m)   Wt 102.1 kg (225 lb)   SpO2 100%   BMI 28.89 kg/m   Physical Exam  Constitutional: He is  oriented to person, place, and time. He appears well-developed and well-nourished. No distress.  HENT:  Head: Normocephalic and atraumatic.  Right Ear: External ear normal.  Left Ear: External ear normal.  Eyes: Conjunctivae and EOM are normal. Pupils are equal, round, and reactive to light.  Neck: Normal range of motion and phonation normal. Neck supple.  Cardiovascular: Normal rate, regular rhythm and normal heart sounds.  Pulmonary/Chest: Effort normal and breath sounds normal. No stridor. No respiratory distress. He has no wheezes. He exhibits no bony tenderness.  Abdominal: Soft. There is no tenderness.  Musculoskeletal: Normal range of motion.  Neurological: He is alert and oriented to person, place, and time. No cranial nerve deficit or sensory deficit. He exhibits normal muscle tone. Coordination normal.  Skin: Skin is warm, dry and intact.  Psychiatric: He has a normal mood and affect. His behavior is normal. Judgment and thought content normal.  Nursing note and vitals reviewed.    ED Treatments / Results  Labs (all labs ordered are listed, but only abnormal results are displayed) Labs Reviewed  BASIC METABOLIC PANEL - Abnormal; Notable for the following components:      Result Value   CO2 21 (*)    Glucose, Bld 100 (*)    Creatinine, Ser 1.67 (*)    GFR calc non Af Amer 38 (*)    GFR calc Af Amer 44 (*)    All other components within normal limits  CBC WITH DIFFERENTIAL/PLATELET - Abnormal; Notable for the following components:   WBC 65.2 (*)    RBC 4.08 (*)    Hemoglobin 12.6 (*)    HCT 38.6 (*)    Platelets 73 (*)    Lymphs Abs 58.0 (*)    Monocytes Absolute 1.3 (*)    All other components within normal limits  I-STAT CG4 LACTIC ACID, ED    EKG  EKG Interpretation  Date/Time:  Saturday July 28 2017 09:39:23 EST Ventricular Rate:  88 PR Interval:    QRS Duration: 118 QT Interval:  369 QTC Calculation: 447 R Axis:   103 Text Interpretation:  Atrial  fibrillation Multiple ventricular premature complexes Left posterior fascicular block Low voltage, extremity and precordial leads Since last tracing PVC are new Confirmed by Daleen Bo [phone]) on 07/28/2017 1:53:50 PM       Radiology Ct Angio Chest Pe W/cm &/or Wo Cm  Result Date: 07/28/2017 CLINICAL DATA:  History of CLL and adenopathy. Patient diagnosed with pneumonia yesterday. Patient coughing up pink tinged sputum. EXAM: CT ANGIOGRAPHY CHEST WITH CONTRAST TECHNIQUE: Multidetector CT imaging of the chest was performed using the standard protocol during bolus administration of intravenous contrast. Multiplanar CT image reconstructions  and MIPs were obtained to evaluate the vascular anatomy. CONTRAST:  56mL ISOVUE-370 IOPAMIDOL (ISOVUE-370) INJECTION 76% COMPARISON:  Chest x-ray July 27, 2017.  Chest CT August 03, 2016. FINDINGS: Cardiovascular: The thoracic aorta is normal in caliber with no evidence of dissection. Mild atherosclerotic change identified. The heart is unchanged. Coronary artery calcifications noted. No pulmonary emboli. Mediastinum/Nodes: Submental adenopathy is identified with a representative node on series 4, image 11 measuring 1.8 cm. The thyroid and esophagus are normal. Bulky mediastinal adenopathy remains. An index node in the AP window on series 4, image 39 measures 2.1 cm today versus 1.9 cm previously. An index subcarinal node measures 2.7 cm today versus 1.9 cm previously. Hilar adenopathy is identified. The retroesophageal adenopathy measured on the previous study measures 2 cm today versus 1.9 cm previously. Axillary adenopathy remains, similar in the interval. Right retropectoral adenopathy remains as well. A few mildly prominent right internal mammary lymph node chain nodes are seen also. Lungs/Pleura: Left lower lobe infiltrate consistent with pneumonia. Mild patchy infiltrate in the right base is well. Central airways are normal. No pneumothorax. Upper Abdomen: Splenomegaly  remains. Adenopathy in the upper abdomen remains. No other acute abnormalities in the upper abdomen. Musculoskeletal: No chest wall abnormality. No acute or significant osseous findings. Review of the MIP images confirms the above findings. IMPRESSION: 1. Bibasilar multifocal pneumonia, left greater than right. Recommend follow-up to resolution. 2. No pulmonary emboli. 3. Continued adenopathy in the bilateral axilla, right retropectoral region, upper abdomen, and mediastinum/hila. The nodes are mildly more prominent in the interval. 4. Splenomegaly. 5. Mild atherosclerotic change in the thoracic aorta. Coronary artery calcifications. Aortic Atherosclerosis (ICD10-I70.0). Electronically Signed   By: Dorise Bullion III M.D   On: 07/28/2017 12:20    Procedures Procedures (including critical care time)  Medications Ordered in ED Medications  sodium chloride 0.9 % bolus 500 mL (0 mLs Intravenous Stopped 07/28/17 1000)  iopamidol (ISOVUE-370) 76 % injection 80 mL (80 mLs Intravenous Contrast Given 07/28/17 1142)     Initial Impression / Assessment and Plan / ED Course  I have reviewed the triage vital signs and the nursing notes.  Pertinent labs & imaging results that were available during my care of the patient were reviewed by me and considered in my medical decision making (see chart for details).      Patient Vitals for the past 24 hrs:  BP Temp Temp src Pulse Resp SpO2  07/28/17 1438 - 98.5 F (36.9 C) Oral - - -  07/28/17 1430 131/87 - - 86 19 100 %  07/28/17 1421 - - - - - 93 %  07/28/17 1400 131/86 - - 94 (!) 26 94 %  07/28/17 1348 132/84 - - 98 16 91 %  07/28/17 1030 125/74 - - 87 17 93 %    At discharge- reevaluation with update and discussion. After initial assessment and treatment, an updated evaluation reveals he remains comfortable, and not coughing.  Oxygen saturation normal on room air.  Findings were discussed with patient and his wife, all questions answered. Daleen Bo      Final Clinical Impressions(s) / ED Diagnoses   Final diagnoses:  Community acquired pneumonia, unspecified laterality   Evaluations consistent with community-acquired pneumonia, without sepsis or metabolic instability.  Patient evaluated with CT angiography today to rule out pulmonary embolus.  No PE is present.  Ongoing nonspecific adenopathy is present.  Evaluation shows bilateral pneumonia, left greater than right, consistent with plain images.  Patient has chronic CLL  and elevation of white count at 65,000, predominantly lymphocytes.  Hemoglobin low at 12.6.  Metabolic panel with normal potassium and chloride.  CO2 very minimally depressed at 21.  Glucose slightly elevated 100.  Patient feels well and is comfortable for discharge.  He understands the need for follow-up with PCP and imaging.  And has already scheduled that appointment.  Nursing Notes Reviewed/ Care Coordinated Applicable Imaging Reviewed Interpretation of Laboratory Data incorporated into ED treatment  The patient appears reasonably screened and/or stabilized for discharge and I doubt any other medical condition or other [facility] requiring further screening, evaluation, or treatment in the ED at this time prior to discharge.  Plan: Home Medications-continue usual medications, Tylenol if needed for fever; Home Treatments-rest, fluids; return here if the recommended treatment, does not improve the symptoms; Recommended follow up-PCP as scheduled, return here if needed.   ED Discharge Orders    None       Daleen Bo, MD 07/29/17 1022

## 2017-07-28 NOTE — ED Notes (Addendum)
Date and time results received: 07/28/17 1012  Test: WBC Critical Value: 65.2  Name of Provider Notified: Dr. Eulis Foster  Orders Received? Or Actions Taken?: None given

## 2017-07-28 NOTE — Discharge Instructions (Signed)
Sure that you are getting plenty of rest, drink a lot of fluids and continue taking the prescription antibiotics already given.  Return here if you become concerned about worsening symptoms.

## 2017-07-30 LAB — PATHOLOGIST SMEAR REVIEW

## 2017-08-02 ENCOUNTER — Other Ambulatory Visit (HOSPITAL_COMMUNITY): Payer: Self-pay

## 2017-08-02 DIAGNOSIS — I482 Chronic atrial fibrillation: Secondary | ICD-10-CM | POA: Diagnosis not present

## 2017-08-02 DIAGNOSIS — E782 Mixed hyperlipidemia: Secondary | ICD-10-CM | POA: Diagnosis not present

## 2017-08-02 DIAGNOSIS — I429 Cardiomyopathy, unspecified: Secondary | ICD-10-CM | POA: Diagnosis not present

## 2017-08-02 DIAGNOSIS — D696 Thrombocytopenia, unspecified: Secondary | ICD-10-CM | POA: Diagnosis not present

## 2017-08-02 DIAGNOSIS — R001 Bradycardia, unspecified: Secondary | ICD-10-CM | POA: Diagnosis not present

## 2017-08-02 DIAGNOSIS — I10 Essential (primary) hypertension: Secondary | ICD-10-CM | POA: Diagnosis not present

## 2017-08-02 DIAGNOSIS — C9110 Chronic lymphocytic leukemia of B-cell type not having achieved remission: Secondary | ICD-10-CM

## 2017-08-02 DIAGNOSIS — R7301 Impaired fasting glucose: Secondary | ICD-10-CM | POA: Diagnosis not present

## 2017-08-02 DIAGNOSIS — G589 Mononeuropathy, unspecified: Secondary | ICD-10-CM | POA: Diagnosis not present

## 2017-08-02 DIAGNOSIS — R944 Abnormal results of kidney function studies: Secondary | ICD-10-CM | POA: Diagnosis not present

## 2017-08-06 ENCOUNTER — Inpatient Hospital Stay (HOSPITAL_COMMUNITY): Payer: Medicare HMO | Attending: Internal Medicine

## 2017-08-06 ENCOUNTER — Other Ambulatory Visit (HOSPITAL_COMMUNITY): Payer: Self-pay | Admitting: *Deleted

## 2017-08-06 DIAGNOSIS — Z79899 Other long term (current) drug therapy: Secondary | ICD-10-CM | POA: Insufficient documentation

## 2017-08-06 DIAGNOSIS — Z87891 Personal history of nicotine dependence: Secondary | ICD-10-CM | POA: Insufficient documentation

## 2017-08-06 DIAGNOSIS — I6200 Nontraumatic subdural hemorrhage, unspecified: Secondary | ICD-10-CM | POA: Insufficient documentation

## 2017-08-06 DIAGNOSIS — C9110 Chronic lymphocytic leukemia of B-cell type not having achieved remission: Secondary | ICD-10-CM

## 2017-08-06 DIAGNOSIS — I10 Essential (primary) hypertension: Secondary | ICD-10-CM | POA: Diagnosis not present

## 2017-08-06 DIAGNOSIS — I4891 Unspecified atrial fibrillation: Secondary | ICD-10-CM | POA: Insufficient documentation

## 2017-08-06 DIAGNOSIS — J189 Pneumonia, unspecified organism: Secondary | ICD-10-CM | POA: Diagnosis not present

## 2017-08-06 LAB — COMPREHENSIVE METABOLIC PANEL
ALT: 16 U/L — ABNORMAL LOW (ref 17–63)
AST: 22 U/L (ref 15–41)
Albumin: 4 g/dL (ref 3.5–5.0)
Alkaline Phosphatase: 56 U/L (ref 38–126)
Anion gap: 8 (ref 5–15)
BUN: 23 mg/dL — ABNORMAL HIGH (ref 6–20)
CHLORIDE: 104 mmol/L (ref 101–111)
CO2: 26 mmol/L (ref 22–32)
Calcium: 9.5 mg/dL (ref 8.9–10.3)
Creatinine, Ser: 1.56 mg/dL — ABNORMAL HIGH (ref 0.61–1.24)
GFR, EST AFRICAN AMERICAN: 48 mL/min — AB (ref >60)
GFR, EST NON AFRICAN AMERICAN: 41 mL/min — AB (ref >60)
Glucose, Bld: 110 mg/dL — ABNORMAL HIGH (ref 65–99)
POTASSIUM: 4.4 mmol/L (ref 3.5–5.1)
Sodium: 138 mmol/L (ref 135–145)
Total Bilirubin: 0.9 mg/dL (ref 0.3–1.2)
Total Protein: 6.3 g/dL — ABNORMAL LOW (ref 6.5–8.1)

## 2017-08-06 LAB — CBC WITH DIFFERENTIAL/PLATELET
Basophils Absolute: 0 10*3/uL (ref 0.0–0.1)
Basophils Relative: 0 %
EOS ABS: 0 10*3/uL (ref 0.0–0.7)
EOS PCT: 0 %
HCT: 39.7 % (ref 39.0–52.0)
Hemoglobin: 13.1 g/dL (ref 13.0–17.0)
LYMPHS ABS: 73.3 10*3/uL — AB (ref 0.7–4.0)
Lymphocytes Relative: 93 %
MCH: 31 pg (ref 26.0–34.0)
MCHC: 33 g/dL (ref 30.0–36.0)
MCV: 94.1 fL (ref 78.0–100.0)
MONO ABS: 0 10*3/uL — AB (ref 0.1–1.0)
Monocytes Relative: 0 %
Neutro Abs: 5.5 10*3/uL (ref 1.7–7.7)
Neutrophils Relative %: 7 %
PLATELETS: 149 10*3/uL — AB (ref 150–400)
RBC: 4.22 MIL/uL (ref 4.22–5.81)
RDW: 13.2 % (ref 11.5–15.5)
WBC: 78.8 10*3/uL — AB (ref 4.0–10.5)

## 2017-08-06 LAB — LACTATE DEHYDROGENASE: LDH: 129 U/L (ref 98–192)

## 2017-08-06 NOTE — Progress Notes (Unsigned)
CRITICAL VALUE ALERT Critical value received:  WBC 78.8 Date of notification: 08/06/17 Time of notification: 1010 Critical value read back:  Yes.   Nurse who received alert:  Isidoro Donning RN Dr. Walden Field notified.

## 2017-08-10 ENCOUNTER — Other Ambulatory Visit (HOSPITAL_COMMUNITY): Payer: Self-pay

## 2017-08-10 DIAGNOSIS — C9110 Chronic lymphocytic leukemia of B-cell type not having achieved remission: Secondary | ICD-10-CM

## 2017-08-13 ENCOUNTER — Encounter (HOSPITAL_COMMUNITY): Payer: Self-pay | Admitting: Internal Medicine

## 2017-08-13 ENCOUNTER — Other Ambulatory Visit (HOSPITAL_COMMUNITY): Payer: Medicare HMO

## 2017-08-13 ENCOUNTER — Other Ambulatory Visit: Payer: Self-pay

## 2017-08-13 ENCOUNTER — Inpatient Hospital Stay (HOSPITAL_COMMUNITY): Payer: Medicare HMO | Admitting: Internal Medicine

## 2017-08-13 ENCOUNTER — Inpatient Hospital Stay (HOSPITAL_COMMUNITY): Payer: Medicare HMO

## 2017-08-13 VITALS — BP 139/90 | HR 69 | Temp 97.6°F | Resp 20 | Wt 231.9 lb

## 2017-08-13 DIAGNOSIS — C9110 Chronic lymphocytic leukemia of B-cell type not having achieved remission: Secondary | ICD-10-CM

## 2017-08-13 DIAGNOSIS — Z79899 Other long term (current) drug therapy: Secondary | ICD-10-CM

## 2017-08-13 DIAGNOSIS — I6200 Nontraumatic subdural hemorrhage, unspecified: Secondary | ICD-10-CM

## 2017-08-13 DIAGNOSIS — I10 Essential (primary) hypertension: Secondary | ICD-10-CM | POA: Diagnosis not present

## 2017-08-13 DIAGNOSIS — Z87891 Personal history of nicotine dependence: Secondary | ICD-10-CM

## 2017-08-13 DIAGNOSIS — I4891 Unspecified atrial fibrillation: Secondary | ICD-10-CM | POA: Diagnosis not present

## 2017-08-13 LAB — CBC WITH DIFFERENTIAL/PLATELET
Basophils Absolute: 0.2 10*3/uL — ABNORMAL HIGH (ref 0.0–0.1)
Basophils Relative: 0 %
Eosinophils Absolute: 0.2 10*3/uL (ref 0.0–0.7)
Eosinophils Relative: 0 %
HCT: 39.3 % (ref 39.0–52.0)
HEMOGLOBIN: 12.9 g/dL — AB (ref 13.0–17.0)
LYMPHS ABS: 77 10*3/uL (ref 0.7–4.0)
LYMPHS PCT: 94 %
MCH: 31 pg (ref 26.0–34.0)
MCHC: 32.8 g/dL (ref 30.0–36.0)
MCV: 94.5 fL (ref 78.0–100.0)
Monocytes Absolute: 1.1 10*3/uL (ref 0.1–1.0)
Monocytes Relative: 1 %
NEUTROS ABS: 4.3 10*3/uL (ref 1.7–7.7)
NEUTROS PCT: 5 %
Platelets: 111 10*3/uL — ABNORMAL LOW (ref 150–400)
RBC: 4.16 MIL/uL — AB (ref 4.22–5.81)
RDW: 13.5 % (ref 11.5–15.5)
WBC: 82.6 10*3/uL (ref 4.0–10.5)

## 2017-08-13 LAB — COMPREHENSIVE METABOLIC PANEL
ALK PHOS: 59 U/L (ref 38–126)
ALT: 14 U/L — ABNORMAL LOW (ref 17–63)
AST: 21 U/L (ref 15–41)
Albumin: 4 g/dL (ref 3.5–5.0)
Anion gap: 10 (ref 5–15)
BUN: 26 mg/dL — AB (ref 6–20)
CALCIUM: 9.7 mg/dL (ref 8.9–10.3)
CO2: 24 mmol/L (ref 22–32)
CREATININE: 1.6 mg/dL — AB (ref 0.61–1.24)
Chloride: 105 mmol/L (ref 101–111)
GFR, EST AFRICAN AMERICAN: 46 mL/min — AB (ref >60)
GFR, EST NON AFRICAN AMERICAN: 40 mL/min — AB (ref >60)
Glucose, Bld: 95 mg/dL (ref 65–99)
Potassium: 4.1 mmol/L (ref 3.5–5.1)
Sodium: 139 mmol/L (ref 135–145)
Total Bilirubin: 0.9 mg/dL (ref 0.3–1.2)
Total Protein: 6.3 g/dL — ABNORMAL LOW (ref 6.5–8.1)

## 2017-08-13 NOTE — Progress Notes (Signed)
CRITICAL VALUE STICKER  CRITICAL VALUE: WBC 82.6  RECEIVER - R.Ameisha Mcclellan RN  DATE & TIME NOTIFIED: 08/13/17  1455  MD NOTIFIED: Dr Walden Field  TIME OF NOTIFICATION: 2956  RESPONSE: Pt in a room next to be seen

## 2017-08-13 NOTE — Patient Instructions (Addendum)
[place] at [facility] Discharge Instructions  You were seen today by Dr. Walden Field. She went over how you've been feeling and any new issues or troubles you've been having.  She went over your recent blood work and everything looks stable. We will see you back in 3 months for labs and follow up.   Thank you for choosing [place] at [facility] to provide your oncology and hematology care.  To afford each patient quality time with our provider, please arrive at least 15 minutes before your scheduled appointment time.   If you have a lab appointment with the [facility] please come in thru the  Main Entrance and check in at the main information desk  You need to re-schedule your appointment should you arrive 10 or more minutes late.  We strive to give you quality time with our providers, and arriving late affects you and other patients whose appointments are after yours.  Also, if you no show three or more times for appointments you may be dismissed from the clinic at the providers discretion.     Again, thank you for choosing [facility].  Our hope is that these requests will decrease the amount of time that you wait before being seen by our physicians.       _____________________________________________________________  Should you have questions after your visit to [facility], please contact our office at [phone] between the hours of 8:30 a.m. and 4:30 p.m.  Voicemails left after 4:30 p.m. will not be returned until the following business day.  For prescription refill requests, have your pharmacy contact our office.       Resources For Cancer Patients and their Caregivers ? American Cancer Society: Can assist with transportation, wigs, general needs, runs Look Good Feel Better.        778-463-3535 ? Cancer Care: Provides financial assistance, online support groups, medication/co-pay assistance.   1-800-813-HOPE 630-438-8764) ? [place] Assists [place] cancer patients and their families through emotional , educational and financial support.  (602)094-3934 ? [place] DSS Where to apply for food stamps, Medicaid and utility assistance. 629-018-8325 ? [facility]: Transportation to medical appointments. (859)886-8123 ? Social Security Administration: May apply for disability if have a Stage IV cancer. (303)304-4006 (412)194-8315 ? [employer], Disability and Transit Services: Assists with nutrition, care and transit needs. [place] Support Programs:   > Cancer Support Group  2nd Tuesday of the month 1pm-2pm, Journey Room   > Creative Journey  3rd Tuesday of the month 1130am-1pm, Journey Room

## 2017-08-16 DIAGNOSIS — N183 Chronic kidney disease, stage 3 (moderate): Secondary | ICD-10-CM | POA: Diagnosis not present

## 2017-08-16 DIAGNOSIS — I129 Hypertensive chronic kidney disease with stage 1 through stage 4 chronic kidney disease, or unspecified chronic kidney disease: Secondary | ICD-10-CM | POA: Diagnosis not present

## 2017-08-16 DIAGNOSIS — D696 Thrombocytopenia, unspecified: Secondary | ICD-10-CM | POA: Diagnosis not present

## 2017-08-16 DIAGNOSIS — E785 Hyperlipidemia, unspecified: Secondary | ICD-10-CM | POA: Diagnosis not present

## 2017-08-16 DIAGNOSIS — N179 Acute kidney failure, unspecified: Secondary | ICD-10-CM | POA: Diagnosis not present

## 2017-08-16 DIAGNOSIS — I700 Atherosclerosis of aorta: Secondary | ICD-10-CM | POA: Diagnosis not present

## 2017-08-16 DIAGNOSIS — I4891 Unspecified atrial fibrillation: Secondary | ICD-10-CM | POA: Diagnosis not present

## 2017-08-16 DIAGNOSIS — C9190 Lymphoid leukemia, unspecified not having achieved remission: Secondary | ICD-10-CM | POA: Diagnosis not present

## 2017-08-16 DIAGNOSIS — N200 Calculus of kidney: Secondary | ICD-10-CM | POA: Diagnosis not present

## 2017-08-22 ENCOUNTER — Ambulatory Visit (HOSPITAL_COMMUNITY)
Admission: RE | Admit: 2017-08-22 | Discharge: 2017-08-22 | Disposition: A | Discharge status: Home / Self Care | Payer: Medicare HMO | Source: Ambulatory Visit | Attending: Internal Medicine | Admitting: Internal Medicine

## 2017-08-22 ENCOUNTER — Other Ambulatory Visit (HOSPITAL_COMMUNITY): Payer: Self-pay | Admitting: Internal Medicine

## 2017-08-22 DIAGNOSIS — Z8701 Personal history of pneumonia (recurrent): Secondary | ICD-10-CM | POA: Insufficient documentation

## 2017-08-22 DIAGNOSIS — J189 Pneumonia, unspecified organism: Secondary | ICD-10-CM

## 2017-08-22 DIAGNOSIS — R591 Generalized enlarged lymph nodes: Secondary | ICD-10-CM | POA: Insufficient documentation

## 2017-08-22 DIAGNOSIS — Z09 Encounter for follow-up examination after completed treatment for conditions other than malignant neoplasm: Secondary | ICD-10-CM | POA: Insufficient documentation

## 2017-08-22 DIAGNOSIS — J181 Lobar pneumonia, unspecified organism: Secondary | ICD-10-CM | POA: Diagnosis not present

## 2017-09-12 NOTE — Progress Notes (Signed)
Diagnosis CLL (chronic lymphocytic leukemia) ([place]) - Plan: CBC with Differential/Platelet, Comprehensive metabolic panel, Lactate dehydrogenase  Staging [name] Staging CLL (chronic lymphocytic leukemia) (HCC) Staging form: Chronic Lymphocytic Leukemia / Small Lymphocytic Lymphoma, AJCC 8th Edition - Clinical stage from 09/04/2016: Modified Rai Stage IV (Binet: Stage C, Modified Rai risk: High, Lymphocytosis: Present, Adenopathy: Present, Organomegaly: Present, Anemia: Absent, Thrombocytopenia: Present) - Signed by Andrew Cancer, PA-C on 09/04/2016   Assessment and Plan: 1.   CLL: Pt was previously followed by Dr. Talbert Cage.  CT neck/chest/abd/pelvis on 08/03/16 showed progressive adenopathy. 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 Progressive thrombocytopenia appreciated with platelets <100,000. Treatment initiated with Imbruvica 420 mg daily on 08/19/16.  Imbruvica stopped on 10/11/16 for subdural hematoma possibly secondary to fall in January 2018 vs. Effects of imbruvica.  Labs today show WBC 82.6 HB 12.9 plts 111,000.  Pt will RTC in 3-4 months for repeat labs and follow-up.    Dr. Talbert Cage discussed with the pt recommendations regarding d/c imbruvica permanently in light of the subdural hematoma.  Given  subdural hematoma, and stable blood counts, I will hold off on initiating treatment for his CLL at this time. We will plan to see him back in 3 months for follow up with labs. I have told him that should his thrombocytopenia worsen to less than 100,000 or leukocytosis worsen, we may consider restarting treatment with either bendamustin + rituxan or chlorambucil + obinutuzumab. Patient verbalized understanding and agreement.   2.  Subdural hematoma s/p craniotomy/Headaches: Cont follow-up with Dr. Sherwood Gambler, neurosurgery.   Orders Placed This Encounter  Procedures  . CBC with Differential    Standing Status:   Future    Standing Expiration Date:   12/18/2017  . Comprehensive metabolic panel   Standing Status:   Future    Standing Expiration Date:   12/18/2017  . Lactate dehydrogenase    Standing Status:   Future    Standing Expiration Date:   12/18/2017     Interval history:  (From Andrew Crigler, PA-C's last note on 09/04/16)    Current Status:  Pt is seen today for follow-up.      CLL (chronic lymphocytic leukemia) ([place])   03/09/2015 Initial Diagnosis    CLL (chronic lymphocytic leukemia) ([place])      08/03/2016 Imaging    CT neck- 1. Diffuse and bulky lymphadenopathy in keeping with history of CLL. Nodal enlargement has diffusely progressed since April 2016. 2. Chest and abdomen CT reported separately.      08/03/2016 Imaging    CT CAP- 1. Bulky lymphadenopathy in the chest, abdomen, and pelvis as described. 2. Splenomegaly. 3. Coronary artery and thoracoabdominal aortic atherosclerosis. 4. Prostatomegaly. 5. Several tiny lucent lesions in the bony anatomic pelvis, nonspecific, but attention on follow-up imaging recommended.      08/10/2016 Procedure    Bone marrow aspiration and biopsy by IR.      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 PERIPHERAL BLOOD: - CHRONIC LYMPHOCYTIC LEUKEMIA. - THROMBOCYTOPENIA.      08/11/2016 Pathology Results    Bone Marrow Flow Cytometry - CHRONIC LYMPHOCYTIC LEUKEMIA/SMALL LYMPHOCYTIC LYMPHOMA.      08/16/2016 Pathology Results    Normal cytogenetics.  Molecular cytogenetic analysis by [facility] demonstrates a 13q-      08/19/2016 -  Chemotherapy    Imbruvica 420 mg daily         Problem List Patient Active  Problem List   Diagnosis Date Noted  . Subdural hematoma ([place]) [S06.5X9A] 10/12/2016  . A-fib ([place]) [I48.91] 10/12/2016  . Hypoxemia [R09.02] 05/13/2016  . Preventative health care [Z00.00] 03/06/2016  . CLL (chronic lymphocytic leukemia) ([place]) [C91.90] 03/09/2015    Past Medical  History Past Medical History:  Diagnosis Date  . Atrial fibrillation ([place])   . Chronic lymphocytic leukemia ([place])   . CLL (chronic lymphocytic leukemia) ([place])   . Dysrhythmia    Hx. of Atrial Fibrillation- has resolved  . Hypertension   . Pinched nerve in neck     Past Surgical History Past Surgical History:  Procedure Laterality Date  . CATARACT EXTRACTION, BILATERAL    . COLONOSCOPY  07/10/2011   Procedure: COLONOSCOPY;  Surgeon: Dorothyann Peng, MD;  [facility];  Service: Endoscopy;  Laterality: N/A;  10:30 AM  . CRANIOTOMY N/A 10/14/2016   Procedure: CRANIOTOMY HEMATOMA EVACUATION SUBDURAL;  Surgeon: Jovita Gamma, MD;  Location: [place];  Service: Neurosurgery;  Laterality: N/A;  CRANIOTOMY HEMATOMA EVACUATION SUBDURAL  . THYROIDECTOMY, PARTIAL    . TONSILLECTOMY      Family History Family History  Problem Relation Age of Onset  . CAD Father 70  . [name] Mother   . Alzheimer's disease Mother   . Colon [name] Neg Hx      Social History  reports that he quit smoking about 46 years ago. He has a 15.00 pack-year smoking history. He has never used smokeless tobacco. He reports that he does not drink alcohol or use drugs.  Medications  Current Outpatient Medications:  .  carvedilol (COREG) 12.5 MG tablet, Take 12.5 mg by mouth 2 (two) times daily with a meal., Disp: , Rfl:  .  Coenzyme Q10 (COQ10) 100 MG CAPS, Take 100 mg by mouth daily. , Disp: , Rfl:  .  Multiple Vitamin (MULITIVITAMIN WITH MINERALS) TABS, Take 1 tablet by mouth daily., Disp: , Rfl:  .  tamsulosin (FLOMAX) 0.4 MG CAPS capsule, Take 1 capsule by mouth daily., Disp: , Rfl:  .  telmisartan (MICARDIS) 80 MG tablet, Take 1 tablet by mouth daily., Disp: , Rfl:  .  amLODipine (NORVASC) 5 MG tablet, Take 1 tablet (5 mg total) by mouth daily., Disp: 90 tablet, Rfl: 3  Allergies Patient has no known allergies.  Review of Systems Review of Systems - Oncology ROS as per HPI otherwise 12 point ROS is  negative.   Physical Exam  Vitals Wt Readings from Last 3 Encounters:  08/13/17 231 lb 14.4 oz (105.2 kg)  07/28/17 225 lb (102.1 kg)  07/27/17 225 lb (102.1 kg)   Temp Readings from Last 3 Encounters:  08/13/17 97.6 F (36.4 C) (Oral)  07/28/17 98.5 F (36.9 C) (Oral)  07/27/17 (!) 97.4 F (36.3 C) (Oral)   BP Readings from Last 3 Encounters:  08/13/17 139/90  07/28/17 131/87  07/27/17 98/64   Pulse Readings from Last 3 Encounters:  08/13/17 69  07/28/17 86  07/27/17 99    Constitutional: Well-developed, well-nourished, and in no distress.   HENT: Head: Normocephalic and atraumatic.  Mouth/Throat: No oropharyngeal exudate. Mucosa moist. Eyes: Pupils are equal, round, and reactive to light. Conjunctivae are normal. No scleral icterus.  Neck: Normal range of motion. Neck supple. No JVD present.  Cardiovascular: Normal rate, regular rhythm and normal heart sounds.  Exam reveals no gallop and no friction rub.   No murmur heard. Pulmonary/Chest: Effort normal and breath sounds normal. No respiratory distress. No wheezes.No rales.  Abdominal: Soft. Bowel sounds are normal. No distension. There is no tenderness. There is no guarding.  Musculoskeletal: No edema or tenderness.  Lymphadenopathy: No axillaryl or supraclavicular adenopathy. Bilateral cervical adenopathy.   Neurological: Alert and oriented to person, place, and time. No cranial nerve deficit.  Skin: Skin is warm and dry. No rash noted. No erythema. No pallor.  Psychiatric: Affect and judgment normal.   Labs Appointment on 08/13/2017  Component Date Value Ref Range Status  . WBC 08/13/2017 82.6* 4.0 - 10.5 K/uL Final   Comment: REPEATED TO VERIFY CRITICAL RESULT CALLED TO, READ BACK BY AND VERIFIED WITH: REBECCA COCHRAN RN AT 1610 BY HFLYNT 08/13/17   . RBC 08/13/2017 4.16* 4.22 - 5.81 MIL/uL Final  . Hemoglobin 08/13/2017 12.9* 13.0 - 17.0 g/dL Final  . HCT 08/13/2017 39.3  39.0 - 52.0 % Final  . MCV  08/13/2017 94.5  78.0 - 100.0 fL Final  . MCH 08/13/2017 31.0  26.0 - 34.0 pg Final  . MCHC 08/13/2017 32.8  30.0 - 36.0 g/dL Final  . RDW 08/13/2017 13.5  11.5 - 15.5 % Final  . Platelets 08/13/2017 111* 150 - 400 K/uL Final   Comment: PLATELET COUNT CONFIRMED BY SMEAR SPECIMEN CHECKED FOR CLOTS   . Neutrophils Relative % 08/13/2017 5  % Final  . Neutro Abs 08/13/2017 4.3  1.7 - 7.7 K/uL Final  . Lymphocytes Relative 08/13/2017 94  % Final  . Lymphs Abs 08/13/2017 77.0  0.7 - 4.0 K/uL Final  . Monocytes Relative 08/13/2017 1  % Final  . Monocytes Absolute 08/13/2017 1.1  0.1 - 1.0 K/uL Final  . Eosinophils Relative 08/13/2017 0  % Final  . Eosinophils Absolute 08/13/2017 0.2  0.0 - 0.7 K/uL Final  . Basophils Relative 08/13/2017 0  % Final  . Basophils Absolute 08/13/2017 0.2* 0.0 - 0.1 K/uL Final   Performed at [facility], [address], [place], [place] [address]  . Sodium 08/13/2017 139  135 - 145 mmol/L Final  . Potassium 08/13/2017 4.1  3.5 - 5.1 mmol/L Final  . Chloride 08/13/2017 105  101 - 111 mmol/L Final  . CO2 08/13/2017 24  22 - 32 mmol/L Final  . Glucose, Bld 08/13/2017 95  65 - 99 mg/dL Final  . BUN 08/13/2017 26* 6 - 20 mg/dL Final  . Creatinine, Ser 08/13/2017 1.60* 0.61 - 1.24 mg/dL Final  . Calcium 08/13/2017 9.7  8.9 - 10.3 mg/dL Final  . Total Protein 08/13/2017 6.3* 6.5 - 8.1 g/dL Final  . Albumin 08/13/2017 4.0  3.5 - 5.0 g/dL Final  . AST 08/13/2017 21  15 - 41 U/L Final  . ALT 08/13/2017 14* 17 - 63 U/L Final  . Alkaline Phosphatase 08/13/2017 59  38 - 126 U/L Final  . Total Bilirubin 08/13/2017 0.9  0.3 - 1.2 mg/dL Final  . GFR calc non Af Amer 08/13/2017 40* >60 mL/min Final  . GFR calc Af Amer 08/13/2017 46* >60 mL/min Final   Comment: (NOTE) The eGFR has been calculated using the CKD EPI equation. This calculation has not been validated in all clinical situations. eGFR's persistently <60 mL/min signify possible Chronic Kidney Disease.   Georgiann Hahn  gap 08/13/2017 10  5 - 15 Final   Performed at [facility], [address], [place],  [address]     Pathology Orders Placed This Encounter  Procedures  . CBC with Differential/Platelet    Standing Status:   Future    Standing Expiration Date:  08/14/2018  . Comprehensive metabolic panel    Standing Status:   Future    Standing Expiration Date:   08/14/2018  . Lactate dehydrogenase    Standing Status:   Future    Standing Expiration Date:   08/14/2018       Zoila Shutter MD

## 2017-09-18 DIAGNOSIS — R69 Illness, unspecified: Secondary | ICD-10-CM | POA: Diagnosis not present

## 2017-10-08 ENCOUNTER — Ambulatory Visit: Payer: Medicare HMO | Admitting: Cardiology

## 2017-10-08 ENCOUNTER — Encounter: Payer: Self-pay | Admitting: Cardiology

## 2017-10-08 VITALS — BP 120/70 | HR 78 | Ht 74.0 in | Wt 232.2 lb

## 2017-10-08 DIAGNOSIS — I10 Essential (primary) hypertension: Secondary | ICD-10-CM | POA: Diagnosis not present

## 2017-10-08 DIAGNOSIS — I4891 Unspecified atrial fibrillation: Secondary | ICD-10-CM | POA: Diagnosis not present

## 2017-10-08 NOTE — Patient Instructions (Signed)

## 2017-10-08 NOTE — Progress Notes (Signed)
Clinical Summary Andrew [name] is a 77 y.o.male seen today for follow up of the following medical problems.   1. PAF - remote DCCV 15 years ago - per neurosurgery not a candidate for at least 2 months for antiplatelets or anticoag CHA2DS2 VASc score is at least 3, however given his subdural hematoma, he is not a candidate for anticoagulation at this time  - no recent palpitations - compliant with meds   2. 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 CLL - followed by oncology  4. Thrombocytopenia - followed by heme, imbruvica was stopped - platelets remain up and down   5. HTN - compliant with meds  6.History of cardiomyopathy -[facility] -he reports being told several year ago that his LVEF was 35%, this was around the time of his first diagnosis of afib. - records are not available at this time - recent echo with normal LVEF 55-60%.  -no recent symptoms.    7. CKD - followed at [place] Past Medical History:  Diagnosis Date  . Atrial fibrillation ([place])   . Chronic lymphocytic leukemia ([place])   . CLL (chronic lymphocytic leukemia) ([place])   . Dysrhythmia    Hx. of Atrial Fibrillation- has resolved  . Hypertension   . Pinched nerve in neck      No Known Allergies   Current Outpatient Medications  Medication Sig Dispense Refill  . amLODipine (NORVASC) 5 MG tablet Take 1 tablet (5 mg total) by mouth daily. 90 tablet 3  . carvedilol (COREG) 12.5 MG tablet Take 12.5 mg by mouth 2 (two) times daily with a meal.    . Coenzyme Q10 (COQ10) 100 MG CAPS Take 100 mg by mouth daily.     . Multiple Vitamin (MULITIVITAMIN WITH MINERALS) TABS Take 1 tablet by mouth daily.    . tamsulosin (FLOMAX) 0.4 MG CAPS capsule Take 1 capsule by mouth daily.    Marland Kitchen telmisartan (MICARDIS) 80 MG tablet Take 1 tablet by mouth daily.     No current  facility-administered medications for this visit.      Past Surgical History:  Procedure Laterality Date  . CATARACT EXTRACTION, BILATERAL    . COLONOSCOPY  07/10/2011   Procedure: COLONOSCOPY;  Surgeon: Dorothyann Peng, MD;  [facility];  Service: Endoscopy;  Laterality: N/A;  10:30 AM  . CRANIOTOMY N/A 10/14/2016   Procedure: CRANIOTOMY HEMATOMA EVACUATION SUBDURAL;  Surgeon: Jovita Gamma, MD;  Location: [place];  Service: Neurosurgery;  Laterality: N/A;  CRANIOTOMY HEMATOMA EVACUATION SUBDURAL  . THYROIDECTOMY, PARTIAL    . TONSILLECTOMY       No Known Allergies    Family History  Problem Relation Age of Onset  . CAD Father 66  . Cancer Mother   . Alzheimer's disease Mother   . Colon cancer Neg Hx      Social History Mr. Siegmann reports that he quit smoking about 46 years ago. He has a 15.00 pack-year smoking history. He has never used smokeless tobacco. Mr. Gracey reports that he does not drink alcohol.   Review of Systems CONSTITUTIONAL: No weight loss, fever, chills, weakness or fatigue.  HEENT: Eyes: No visual loss, blurred vision, double vision or yellow sclerae.No hearing loss, sneezing, congestion, runny nose or sore throat.  SKIN: No rash or itching.  CARDIOVASCULAR: per hpi RESPIRATORY: No shortness of breath, cough or sputum.  GASTROINTESTINAL: No anorexia, nausea,  vomiting or diarrhea. No abdominal pain or blood.  GENITOURINARY: No burning on urination, no polyuria NEUROLOGICAL: No headache, dizziness, syncope, paralysis, ataxia, numbness or tingling in the extremities. No change in bowel or bladder control.  MUSCULOSKELETAL: No muscle, back pain, joint pain or stiffness.  LYMPHATICS: No enlarged nodes. No history of splenectomy.  PSYCHIATRIC: No history of depression or anxiety.  ENDOCRINOLOGIC: No reports of sweating, cold or heat intolerance. No polyuria or polydipsia.  Marland Kitchen   Physical Examination Vitals:   10/08/17 0811  BP: 120/70    Pulse: 78  SpO2: 96%   Vitals:   10/08/17 0811  Weight: 232 lb 3.2 oz (105.3 kg)  Height: 6\' 2"  (1.88 m)    Gen: resting comfortably, no acute distress HEENT: no scleral icterus, pupils equal round and reactive, no palptable cervical adenopathy,  CV: RRR, no m/r/g, no jvd Resp: Clear to auscultation bilaterally GI: abdomen is soft, non-tender, non-distended, normal bowel sounds, no hepatosplenomegaly MSK: extremities are warm, no edema.  Skin: warm, no rash Neuro:  no focal deficits Psych: appropriate affect   Diagnostic Studies 09/2016 echo Study Conclusions  - Left ventricle: The cavity size was normal. There was mild concentric hypertrophy. Systolic function was normal. The estimated ejection fraction was in the range of 55% to 60%. Wall motion was normal; there were no regional wall motion abnormalities. - Aortic valve: There was trivial regurgitation. - Left atrium: The atrium was mildly dilated.     Assessment and Plan  1. PAF - no recent symptoms, continue current meds.  - no anticoag due to prior SDH and labile platelets.  2. HTN - at goal, continue current meds   F/u 6 months       [name], M.D

## 2017-11-05 ENCOUNTER — Inpatient Hospital Stay (HOSPITAL_COMMUNITY): Payer: Medicare HMO | Attending: Hematology

## 2017-11-05 DIAGNOSIS — I129 Hypertensive chronic kidney disease with stage 1 through stage 4 chronic kidney disease, or unspecified chronic kidney disease: Secondary | ICD-10-CM | POA: Diagnosis not present

## 2017-11-05 DIAGNOSIS — I4891 Unspecified atrial fibrillation: Secondary | ICD-10-CM | POA: Insufficient documentation

## 2017-11-05 DIAGNOSIS — N183 Chronic kidney disease, stage 3 (moderate): Secondary | ICD-10-CM | POA: Insufficient documentation

## 2017-11-05 DIAGNOSIS — R161 Splenomegaly, not elsewhere classified: Secondary | ICD-10-CM | POA: Insufficient documentation

## 2017-11-05 DIAGNOSIS — Z87891 Personal history of nicotine dependence: Secondary | ICD-10-CM | POA: Diagnosis not present

## 2017-11-05 DIAGNOSIS — R5383 Other fatigue: Secondary | ICD-10-CM | POA: Diagnosis not present

## 2017-11-05 DIAGNOSIS — I6200 Nontraumatic subdural hemorrhage, unspecified: Secondary | ICD-10-CM | POA: Insufficient documentation

## 2017-11-05 DIAGNOSIS — Z809 Family history of malignant neoplasm, unspecified: Secondary | ICD-10-CM | POA: Diagnosis not present

## 2017-11-05 DIAGNOSIS — C9190 Lymphoid leukemia, unspecified not having achieved remission: Secondary | ICD-10-CM | POA: Diagnosis present

## 2017-11-05 DIAGNOSIS — N400 Enlarged prostate without lower urinary tract symptoms: Secondary | ICD-10-CM | POA: Insufficient documentation

## 2017-11-05 DIAGNOSIS — I700 Atherosclerosis of aorta: Secondary | ICD-10-CM | POA: Diagnosis not present

## 2017-11-05 DIAGNOSIS — C9110 Chronic lymphocytic leukemia of B-cell type not having achieved remission: Secondary | ICD-10-CM

## 2017-11-05 DIAGNOSIS — I2510 Atherosclerotic heart disease of native coronary artery without angina pectoris: Secondary | ICD-10-CM | POA: Diagnosis not present

## 2017-11-05 DIAGNOSIS — M899 Disorder of bone, unspecified: Secondary | ICD-10-CM | POA: Diagnosis not present

## 2017-11-05 DIAGNOSIS — Z79899 Other long term (current) drug therapy: Secondary | ICD-10-CM | POA: Diagnosis not present

## 2017-11-05 DIAGNOSIS — Z9221 Personal history of antineoplastic chemotherapy: Secondary | ICD-10-CM | POA: Diagnosis not present

## 2017-11-05 LAB — COMPREHENSIVE METABOLIC PANEL
ALBUMIN: 4.3 g/dL (ref 3.5–5.0)
ALK PHOS: 64 U/L (ref 38–126)
ALT: 13 U/L — ABNORMAL LOW (ref 17–63)
ANION GAP: 9 (ref 5–15)
AST: 18 U/L (ref 15–41)
BUN: 45 mg/dL — ABNORMAL HIGH (ref 6–20)
CALCIUM: 9.8 mg/dL (ref 8.9–10.3)
CHLORIDE: 107 mmol/L (ref 101–111)
CO2: 25 mmol/L (ref 22–32)
Creatinine, Ser: 3.1 mg/dL — ABNORMAL HIGH (ref 0.61–1.24)
GFR calc Af Amer: 21 mL/min — ABNORMAL LOW (ref >60)
GFR calc non Af Amer: 18 mL/min — ABNORMAL LOW (ref >60)
GLUCOSE: 95 mg/dL (ref 65–99)
POTASSIUM: 4.5 mmol/L (ref 3.5–5.1)
SODIUM: 141 mmol/L (ref 135–145)
Total Bilirubin: 1 mg/dL (ref 0.3–1.2)
Total Protein: 6.6 g/dL (ref 6.5–8.1)

## 2017-11-05 LAB — CBC WITH DIFFERENTIAL/PLATELET
BASOS PCT: 0 %
Basophils Absolute: 0 10*3/uL (ref 0.0–0.1)
EOS ABS: 0 10*3/uL (ref 0.0–0.7)
Eosinophils Relative: 0 %
HCT: 37.8 % — ABNORMAL LOW (ref 39.0–52.0)
Hemoglobin: 12.1 g/dL — ABNORMAL LOW (ref 13.0–17.0)
Lymphocytes Relative: 94 %
Lymphs Abs: 93.9 10*3/uL — ABNORMAL HIGH (ref 0.7–4.0)
MCH: 30.3 pg (ref 26.0–34.0)
MCHC: 32 g/dL (ref 30.0–36.0)
MCV: 94.7 fL (ref 78.0–100.0)
MONO ABS: 1 10*3/uL (ref 0.1–1.0)
Monocytes Relative: 1 %
Neutro Abs: 5 10*3/uL (ref 1.7–7.7)
Neutrophils Relative %: 5 %
PLATELETS: 97 10*3/uL — AB (ref 150–400)
RBC: 3.99 MIL/uL — AB (ref 4.22–5.81)
RDW: 13.4 % (ref 11.5–15.5)
WBC: 99.9 10*3/uL — AB (ref 4.0–10.5)

## 2017-11-05 LAB — LACTATE DEHYDROGENASE: LDH: 133 U/L (ref 98–192)

## 2017-11-05 NOTE — Progress Notes (Unsigned)
Pt aware that Dr. Walden Field would like to see him tomorrow to go over his labs and follow up. Pt agreed.

## 2017-11-05 NOTE — Progress Notes (Unsigned)
CRITICAL VALUE ALERT Critical value received: WBC- 99.9 Date of notification:  11/05/2017 Time of notification: 10.30 Critical value read back:  Yes.   Nurse who received alert:  Venita Lick LPN MD notified (1st page):  Dr. Walden Field, advised the pt would need to come into the clinic this for follow up on lab work and discuss treatment options.

## 2017-11-06 ENCOUNTER — Other Ambulatory Visit (HOSPITAL_COMMUNITY): Payer: Medicare HMO

## 2017-11-06 ENCOUNTER — Ambulatory Visit (HOSPITAL_COMMUNITY): Payer: Medicare HMO | Admitting: Internal Medicine

## 2017-11-07 ENCOUNTER — Other Ambulatory Visit: Payer: Self-pay | Admitting: Cardiology

## 2017-11-08 ENCOUNTER — Encounter (HOSPITAL_COMMUNITY): Payer: Self-pay | Admitting: Internal Medicine

## 2017-11-08 ENCOUNTER — Inpatient Hospital Stay (HOSPITAL_BASED_OUTPATIENT_CLINIC_OR_DEPARTMENT_OTHER): Payer: Medicare HMO | Admitting: Internal Medicine

## 2017-11-08 VITALS — BP 128/71 | HR 69 | Temp 98.0°F | Resp 18 | Wt 231.6 lb

## 2017-11-08 DIAGNOSIS — C9110 Chronic lymphocytic leukemia of B-cell type not having achieved remission: Secondary | ICD-10-CM

## 2017-11-08 DIAGNOSIS — Z79899 Other long term (current) drug therapy: Secondary | ICD-10-CM

## 2017-11-08 DIAGNOSIS — I2510 Atherosclerotic heart disease of native coronary artery without angina pectoris: Secondary | ICD-10-CM

## 2017-11-08 DIAGNOSIS — I129 Hypertensive chronic kidney disease with stage 1 through stage 4 chronic kidney disease, or unspecified chronic kidney disease: Secondary | ICD-10-CM | POA: Diagnosis not present

## 2017-11-08 DIAGNOSIS — Z9221 Personal history of antineoplastic chemotherapy: Secondary | ICD-10-CM

## 2017-11-08 DIAGNOSIS — C9190 Lymphoid leukemia, unspecified not having achieved remission: Secondary | ICD-10-CM

## 2017-11-08 DIAGNOSIS — I4891 Unspecified atrial fibrillation: Secondary | ICD-10-CM

## 2017-11-08 DIAGNOSIS — Z809 Family history of malignant neoplasm, unspecified: Secondary | ICD-10-CM

## 2017-11-08 DIAGNOSIS — N183 Chronic kidney disease, stage 3 unspecified: Secondary | ICD-10-CM

## 2017-11-08 DIAGNOSIS — R161 Splenomegaly, not elsewhere classified: Secondary | ICD-10-CM | POA: Diagnosis not present

## 2017-11-08 DIAGNOSIS — N400 Enlarged prostate without lower urinary tract symptoms: Secondary | ICD-10-CM | POA: Diagnosis not present

## 2017-11-08 DIAGNOSIS — I700 Atherosclerosis of aorta: Secondary | ICD-10-CM

## 2017-11-08 DIAGNOSIS — M899 Disorder of bone, unspecified: Secondary | ICD-10-CM

## 2017-11-08 DIAGNOSIS — Z87891 Personal history of nicotine dependence: Secondary | ICD-10-CM

## 2017-11-08 DIAGNOSIS — I6200 Nontraumatic subdural hemorrhage, unspecified: Secondary | ICD-10-CM | POA: Diagnosis not present

## 2017-11-08 DIAGNOSIS — R5383 Other fatigue: Secondary | ICD-10-CM

## 2017-11-08 NOTE — Patient Instructions (Signed)
[facility] at [facility] Discharge Instructions  You saw Dr. Higgs today.   Thank you for choosing [facility] at [facility] to provide your oncology and hematology care.  To afford each patient quality time with our provider, please arrive at least 15 minutes before your scheduled appointment time.   If you have a lab appointment with the Cancer Center please come in thru the  Main Entrance and check in at the main information desk  You need to re-schedule your appointment should you arrive 10 or more minutes late.  We strive to give you quality time with our providers, and arriving late affects you and other patients whose appointments are after yours.  Also, if you no show three or more times for appointments you may be dismissed from the clinic at the providers discretion.     Again, thank you for choosing [facility].  Our hope is that these requests will decrease the amount of time that you wait before being seen by our physicians.       _____________________________________________________________  Should you have questions after your visit to [facility], please contact our office at [phone] between the hours of 8:30 a.m. and 4:30 p.m.  Voicemails left after 4:30 p.m. will not be returned until the following business day.  For prescription refill requests, have your pharmacy contact our office.       Resources For Cancer Patients and their Caregivers ? American Cancer Society: Can assist with transportation, wigs, general needs, runs Look Good Feel Better.        1-888-227-6333 ? Cancer Care: Provides financial assistance, online support groups, medication/co-pay assistance.  1-800-813-HOPE (4673) ? [facility] Assists [place] cancer patients and their families through emotional , educational and financial support.  336-427-4357 ? [place] DSS Where to apply for food  stamps, Medicaid and utility assistance. 336-342-1394 ? [facility]: Transportation to medical appointments. 336-347-2287 ? Social Security Administration: May apply for disability if have a Stage IV cancer. 336-342-7796 1-800-772-1213 ? [place] Aging, Disability and Transit Services: Assists with nutrition, care and transit needs. 336-349-2343  Cancer Center Support Programs:   > Cancer Support Group  2nd Tuesday of the month 1pm-2pm, Journey Room   > Creative Journey  3rd Tuesday of the month 1130am-1pm, Journey Room     

## 2017-11-08 NOTE — Progress Notes (Signed)
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[place]) - Plan: CBC with Differential/Platelet, Comprehensive metabolic panel, Lactate dehydrogenase, Uric acid, Hepatitis panel, acute, Hepatitis B core antibody, total, Hepatitis B surface antibody, Hepatitis C RNA quantitative, HIV antibody (with reflex), NM PET Image Initial (PI) Skull Base To Thigh  Staging Cancer Staging CLL (chronic lymphocytic leukemia) (HCC) Staging form: Chronic Lymphocytic Leukemia / Small Lymphocytic Lymphoma, AJCC 8th Edition - Clinical stage from 09/04/2016: Modified Rai Stage IV (Binet: Stage C, Modified Rai risk: High, Lymphocytosis: Present, Adenopathy: Present, Organomegaly: Present, Anemia: Absent, Thrombocytopenia: Present) - Signed by Baird Cancer, PA-C on 09/04/2016   Assessment and Plan:  1.   CLL.  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 Pt was previously followed by Dr. Talbert Cage.  CT neck/chest/abd/pelvis on 08/03/16 showed progressive adenopathy. 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 Progressive thrombocytopenia appreciated with platelets <100,000. Treatment initiated with Imbruvica 420 mg daily on 08/19/16.  Imbruvica stopped on 10/11/16 for subdural hematoma possibly secondary to fall in January 2018 vs. Effects of imbruvica.  Pt had previous discussion with Dr. Talbert Cage regarding recommendations due to discontinuation of  imbruvica permanently in light of the subdural hematoma.    Labs done 11/05/2017 reviewed with pt and show WBC 99.9 which is increased from 82.6 on labs done in 07/2017.  Plt count is also decreased at 97,000 and was previously 111,000.  He has stable lymphocyte count at 94%.   I have discussed with him   thrombocytopenia has worsened and is now less than 100,000.  WBC has also increased.  Pt remains asymptomatic.  He will be set up for PET scan for restaging evaluation of adenopathy which was chosen due to worsening renal function.   I briefly discussed options of therapy with  bendamustin + rituxan or chlorambucil + obinutuzumab. Patient would prefer an oral regimen.  He will RTC in 1 week for follow-up to go over labs and scan results.  Will check Hep B and C and HIV prior to therapy initiation.     2.  CKD.  His Cr has increased to 3 from 1.6.  He will undergo PET scan but will not have contrast.  Have encouraged hydration.  Will check uric acid on RTC in 1 week.  Follow-up with nephrology.    3.  Subdural hematoma s/p craniotomy/Headaches: Cont follow-up with Dr. Sherwood Gambler, neurosurgery.  I have discussed with him if IV access recommended he would not be able to have Heparin flushes.    4.  HTN.  BP is 128/71.  Follow-up with PCP.    5.  Fatigue.   HB is stable at 12.  May be due to changes in counts and CLL.    Current Status:  Pt is seen today for follow-up.  He is here to go over labs.  He reports mild fatigue.      CLL (chronic lymphocytic leukemia) ([place])   03/09/2015 Initial Diagnosis    CLL (chronic lymphocytic leukemia) ([place])      08/03/2016 Imaging    CT neck- 1. Diffuse and bulky lymphadenopathy in keeping with history of CLL. Nodal enlargement has diffusely progressed since April 2016. 2. Chest and abdomen CT reported separately.      08/03/2016 Imaging    CT CAP- 1. Bulky  lymphadenopathy in the chest, abdomen, and pelvis as described. 2. Splenomegaly. 3. Coronary artery and thoracoabdominal aortic atherosclerosis. 4. Prostatomegaly. 5. Several tiny lucent lesions in the bony anatomic pelvis, nonspecific, but attention on follow-up imaging recommended.      08/10/2016 Procedure    Bone marrow aspiration and biopsy by IR.      08/11/2016 Pathology Results    Bone Marrow,  Aspirate,Biopsy, and Clot, left iliac crest - MARKED INVOLVEMENT BY CHRONIC LYMPHOCYTIC LEUKEMIA/SMALL LYMPHOCYTIC LYMPHOMA. PERIPHERAL BLOOD: - CHRONIC LYMPHOCYTIC LEUKEMIA. - THROMBOCYTOPENIA.      08/11/2016 Pathology Results    Bone Marrow Flow Cytometry - CHRONIC LYMPHOCYTIC LEUKEMIA/SMALL LYMPHOCYTIC LYMPHOMA.      08/16/2016 Pathology Results    Normal cytogenetics.  Molecular cytogenetic analysis by FISH demonstrates a 13q-      08/19/2016 -  Chemotherapy    Imbruvica 420 mg daily         Problem List Patient Active Problem List   Diagnosis Date Noted  . Subdural hematoma (HCC) [S06.5X9A] 10/12/2016  . A-fib (HCC) [I48.91] 10/12/2016  . Hypoxemia [R09.02] 05/13/2016  . Preventative health care [Z00.00] 03/06/2016  . CLL (chronic lymphocytic leukemia) (HCC) [C91.90] 03/09/2015    Past Medical History Past Medical History:  Diagnosis Date  . Atrial fibrillation (HCC)   . Chronic kidney disease    stage 3  . Chronic lymphocytic leukemia (HCC)   . CLL (chronic lymphocytic leukemia) (HCC)   . Dysrhythmia    Hx. of Atrial Fibrillation- has resolved  . Hypertension   . Pinched nerve in neck     Past Surgical History Past Surgical History:  Procedure Laterality Date  . CATARACT EXTRACTION, BILATERAL    . COLONOSCOPY  07/10/2011   Procedure: COLONOSCOPY;  Surgeon: Sandi M Fields, MD;  Location: [facility];  Service: Endoscopy;  Laterality: N/A;  10:30 AM  . CRANIOTOMY N/A 10/14/2016   Procedure: CRANIOTOMY HEMATOMA EVACUATION SUBDURAL;  Surgeon: Nudelman, Robert, MD;  Location: [facility] OR;  Service: Neurosurgery;  Laterality: N/A;  CRANIOTOMY HEMATOMA EVACUATION SUBDURAL  . THYROIDECTOMY, PARTIAL    . TONSILLECTOMY      Family History Family History  Problem Relation Age of Onset  . CAD Father 52  . Cancer Mother   . Alzheimer's disease Mother   . Colon cancer Neg Hx      Social History  reports that he quit smoking about 46 years ago. He has a 15.00  pack-year smoking history. He has never used smokeless tobacco. He reports that he does not drink alcohol or use drugs.  Medications  Current Outpatient Medications:  .  amLODipine (NORVASC) 5 MG tablet, TAKE 1 TABLET BY MOUTH ONCE DAILY, Disp: 90 tablet, Rfl: 3 .  carvedilol (COREG) 12.5 MG tablet, Take 12.5 mg by mouth 2 (two) times daily with a meal., Disp: , Rfl:  .  Coenzyme Q10 (COQ10) 100 MG CAPS, Take 100 mg by mouth daily. , Disp: , Rfl:  .  Multiple Vitamin (MULITIVITAMIN WITH MINERALS) TABS, Take 1 tablet by mouth daily., Disp: , Rfl:  .  tamsulosin (FLOMAX) 0.4 MG CAPS capsule, Take 1 capsule by mouth daily., Disp: , Rfl:  .  telmisartan (MICARDIS) 80 MG tablet, Take 1 tablet by mouth daily., Disp: , Rfl:   Allergies Patient has no known allergies.  Review of Systems Review of Systems - Oncology ROS as per HPI otherwise 12 point ROS is negative.   Physical Exam  Vitals Wt Readings from Last 3 Encounters:  11/08/17 231   lb 9.6 oz (105.1 kg)  10/08/17 232 lb 3.2 oz (105.3 kg)  08/13/17 231 lb 14.4 oz (105.2 kg)   Temp Readings from Last 3 Encounters:  11/08/17 98 F (36.7 C) (Oral)  08/13/17 97.6 F (36.4 C) (Oral)  07/28/17 98.5 F (36.9 C) (Oral)   BP Readings from Last 3 Encounters:  11/08/17 128/71  10/08/17 120/70  08/13/17 139/90   Pulse Readings from Last 3 Encounters:  11/08/17 69  10/08/17 78  08/13/17 69   Constitutional: Well-developed, well-nourished, and in no distress.   HENT: Head: Normocephalic and atraumatic.  Mouth/Throat: No oropharyngeal exudate. Mucosa moist. Eyes: Pupils are equal, round, and reactive to light. Conjunctivae are normal. No scleral icterus.  Neck: Normal range of motion. Neck supple. No JVD present.  Cardiovascular: Normal rate, regular rhythm and normal heart sounds.  Exam reveals no gallop and no friction rub.   No murmur heard. Pulmonary/Chest: Effort normal and breath sounds normal. No respiratory distress. No  wheezes.No rales.  Abdominal: Soft. Bowel sounds are normal. No distension. There is no tenderness. There is no guarding.  Musculoskeletal: No edema or tenderness.  Lymphadenopathy: Palpable adenopathy in the cervical area bilaterally.  Neurological: Alert and oriented to person, place, and time. No cranial nerve deficit.  Skin: Skin is warm and dry. No rash noted. No erythema. No pallor.  Psychiatric: Affect and judgment normal.   Labs No visits with results within 3 Day(s) from this visit.  Latest known visit with results is:  Lab on 11/05/2017  Component Date Value Ref Range Status  . WBC 11/05/2017 99.9* 4.0 - 10.5 K/uL Final   Comment: CRITICAL RESULT CALLED TO, READ BACK BY AND VERIFIED WITH: TRAVIS A. AT 1019A ON [id number] BY THOMPSON S.   . RBC 11/05/2017 3.99* 4.22 - 5.81 MIL/uL Final  . Hemoglobin 11/05/2017 12.1* 13.0 - 17.0 g/dL Final  . HCT 11/05/2017 37.8* 39.0 - 52.0 % Final  . MCV 11/05/2017 94.7  78.0 - 100.0 fL Final  . MCH 11/05/2017 30.3  26.0 - 34.0 pg Final  . MCHC 11/05/2017 32.0  30.0 - 36.0 g/dL Final  . RDW 11/05/2017 13.4  11.5 - 15.5 % Final  . Platelets 11/05/2017 97* 150 - 400 K/uL Final   Comment: SPECIMEN CHECKED FOR CLOTS PLATELET COUNT CONFIRMED BY SMEAR   . Neutrophils Relative % 11/05/2017 5  % Final  . Lymphocytes Relative 11/05/2017 94  % Final  . Monocytes Relative 11/05/2017 1  % Final  . Eosinophils Relative 11/05/2017 0  % Final  . Basophils Relative 11/05/2017 0  % Final  . Neutro Abs 11/05/2017 5.0  1.7 - 7.7 K/uL Final  . Lymphs Abs 11/05/2017 93.9* 0.7 - 4.0 K/uL Final  . Monocytes Absolute 11/05/2017 1.0  0.1 - 1.0 K/uL Final  . Eosinophils Absolute 11/05/2017 0.0  0.0 - 0.7 K/uL Final  . Basophils Absolute 11/05/2017 0.0  0.0 - 0.1 K/uL Final  . WBC Morphology 11/05/2017 ABSOLUTE LYMPHOCYTOSIS   Final   Comment: ATYPICAL LYMPHOCYTES SMUDGE CELLS Performed at [facility], [address], [place], [place] [address]   . Sodium  11/05/2017 141  135 - 145 mmol/L Final  . Potassium 11/05/2017 4.5  3.5 - 5.1 mmol/L Final  . Chloride 11/05/2017 107  101 - 111 mmol/L Final  . CO2 11/05/2017 25  22 - 32 mmol/L Final  . Glucose, Bld 11/05/2017 95  65 - 99 mg/dL Final  . BUN 11/05/2017 45* 6 - 20 mg/dL Final  .  Creatinine, Ser 11/05/2017 3.10* 0.61 - 1.24 mg/dL Final  . Calcium 11/05/2017 9.8  8.9 - 10.3 mg/dL Final  . Total Protein 11/05/2017 6.6  6.5 - 8.1 g/dL Final  . Albumin 11/05/2017 4.3  3.5 - 5.0 g/dL Final  . AST 11/05/2017 18  15 - 41 U/L Final  . ALT 11/05/2017 13* 17 - 63 U/L Final  . Alkaline Phosphatase 11/05/2017 64  38 - 126 U/L Final  . Total Bilirubin 11/05/2017 1.0  0.3 - 1.2 mg/dL Final  . GFR calc non Af Amer 11/05/2017 18* >60 mL/min Final  . GFR calc Af Amer 11/05/2017 21* >60 mL/min Final   Comment: (NOTE) The eGFR has been calculated using the CKD EPI equation. This calculation has not been validated in all clinical situations. eGFR's persistently <60 mL/min signify possible Chronic Kidney Disease.   . Anion gap 11/05/2017 9  5 - 15 Final   Performed at [facility], [address] [place], [place] [address]  . LDH 11/05/2017 133  98 - 192 U/L Final   Performed at [facility], [address] , [place] [address]     Pathology Orders Placed This Encounter  Procedures  . NM PET Image Initial (PI) Skull Base To Thigh    Standing Status:   Future    Standing Expiration Date:   11/08/2018    Order Specific Question:   If indicated for the ordered procedure, I authorize the administration of a radiopharmaceutical per Radiology protocol    Answer:   Yes    Order Specific Question:   Preferred imaging location?    Answer:   [facility]    Order Specific Question:   Radiology Contrast Protocol - do NOT remove file path    Answer:   [email or web address]  . CBC with Differential/Platelet    Standing Status:   Future    Standing Expiration Date:    11/09/2018  . Comprehensive metabolic panel    Standing Status:   Future    Standing Expiration Date:   11/09/2018  . Lactate dehydrogenase    Standing Status:   Future    Standing Expiration Date:   11/09/2018  . Uric acid    Standing Status:   Future    Standing Expiration Date:   11/09/2018  . Hepatitis panel, acute    Standing Status:   Future    Standing Expiration Date:   11/09/2018  . Hepatitis B core antibody, total    Standing Status:   Future    Standing Expiration Date:   11/09/2018  . Hepatitis B surface antibody    Standing Status:   Future    Standing Expiration Date:   11/09/2018  . Hepatitis C RNA quantitative    Standing Status:   Future    Standing Expiration Date:   11/09/2018  . HIV antibody (with reflex)    Standing Status:   Future    Standing Expiration Date:   11/09/2018       Vetta Higgs MD 

## 2017-11-13 ENCOUNTER — Ambulatory Visit (HOSPITAL_COMMUNITY): Payer: Medicare HMO | Admitting: Adult Health

## 2017-11-15 ENCOUNTER — Ambulatory Visit (HOSPITAL_COMMUNITY): Payer: Medicare HMO | Admitting: Internal Medicine

## 2017-11-15 ENCOUNTER — Ambulatory Visit (HOSPITAL_COMMUNITY)
Admission: RE | Admit: 2017-11-15 | Discharge: 2017-11-15 | Disposition: A | Discharge status: Home / Self Care | Payer: Medicare HMO | Source: Ambulatory Visit | Attending: Internal Medicine | Admitting: Internal Medicine

## 2017-11-15 DIAGNOSIS — R590 Localized enlarged lymph nodes: Secondary | ICD-10-CM | POA: Insufficient documentation

## 2017-11-15 DIAGNOSIS — N183 Chronic kidney disease, stage 3 unspecified: Secondary | ICD-10-CM

## 2017-11-15 DIAGNOSIS — C9110 Chronic lymphocytic leukemia of B-cell type not having achieved remission: Secondary | ICD-10-CM | POA: Diagnosis not present

## 2017-11-15 LAB — GLUCOSE, CAPILLARY: GLUCOSE-CAPILLARY: 100 mg/dL — AB (ref 65–99)

## 2017-11-15 MED ORDER — FLUDEOXYGLUCOSE F - 18 (FDG) INJECTION
11.5000 | Freq: Once | INTRAVENOUS | Status: AC | PRN
Start: 2017-11-15 — End: 2017-11-15
  Administered 2017-11-15: 11.5 via INTRAVENOUS

## 2017-11-19 ENCOUNTER — Encounter (HOSPITAL_COMMUNITY): Payer: Self-pay | Admitting: Internal Medicine

## 2017-11-19 ENCOUNTER — Other Ambulatory Visit: Payer: Self-pay

## 2017-11-19 ENCOUNTER — Inpatient Hospital Stay (HOSPITAL_COMMUNITY): Payer: Medicare HMO

## 2017-11-19 ENCOUNTER — Inpatient Hospital Stay (HOSPITAL_COMMUNITY): Payer: Medicare HMO | Attending: Internal Medicine | Admitting: Internal Medicine

## 2017-11-19 VITALS — BP 117/79 | HR 78 | Temp 98.1°F | Resp 18 | Wt 230.3 lb

## 2017-11-19 DIAGNOSIS — Z79899 Other long term (current) drug therapy: Secondary | ICD-10-CM | POA: Diagnosis not present

## 2017-11-19 DIAGNOSIS — N183 Chronic kidney disease, stage 3 unspecified: Secondary | ICD-10-CM

## 2017-11-19 DIAGNOSIS — I129 Hypertensive chronic kidney disease with stage 1 through stage 4 chronic kidney disease, or unspecified chronic kidney disease: Secondary | ICD-10-CM | POA: Diagnosis not present

## 2017-11-19 DIAGNOSIS — S065X9S Traumatic subdural hemorrhage with loss of consciousness of unspecified duration, sequela: Secondary | ICD-10-CM | POA: Diagnosis not present

## 2017-11-19 DIAGNOSIS — Z87891 Personal history of nicotine dependence: Secondary | ICD-10-CM

## 2017-11-19 DIAGNOSIS — I4891 Unspecified atrial fibrillation: Secondary | ICD-10-CM | POA: Diagnosis not present

## 2017-11-19 DIAGNOSIS — C9190 Lymphoid leukemia, unspecified not having achieved remission: Secondary | ICD-10-CM | POA: Insufficient documentation

## 2017-11-19 DIAGNOSIS — D696 Thrombocytopenia, unspecified: Secondary | ICD-10-CM | POA: Insufficient documentation

## 2017-11-19 DIAGNOSIS — C9110 Chronic lymphocytic leukemia of B-cell type not having achieved remission: Secondary | ICD-10-CM

## 2017-11-19 LAB — URIC ACID: Uric Acid, Serum: 7.3 mg/dL (ref 4.4–7.6)

## 2017-11-19 NOTE — Patient Instructions (Signed)
[facility] Discharge Instructions  Today you saw Dr. Higgs.    Thank you for choosing [facility] at [facility] to provide your oncology and hematology care.  To afford each patient quality time with our provider, please arrive at least 15 minutes before your scheduled appointment time.   If you have a lab appointment with the Cancer Center please come in thru the  Main Entrance and check in at the main information desk  You need to re-schedule your appointment should you arrive 10 or more minutes late.  We strive to give you quality time with our providers, and arriving late affects you and other patients whose appointments are after yours.  Also, if you no show three or more times for appointments you may be dismissed from the clinic at the providers discretion.     Again, thank you for choosing [facility].  Our hope is that these requests will decrease the amount of time that you wait before being seen by our physicians.       _____________________________________________________________  Should you have questions after your visit to [facility], please contact our office at [phone] between the hours of 8:30 a.m. and 4:30 p.m.  Voicemails left after 4:30 p.m. will not be returned until the following business day.  For prescription refill requests, have your pharmacy contact our office.       Resources For Cancer Patients and their Caregivers ? American Cancer Society: Can assist with transportation, wigs, general needs, runs Look Good Feel Better.        1-888-227-6333 ? Cancer Care: Provides financial assistance, online support groups, medication/co-pay assistance.  1-800-813-HOPE (4673) ? [facility] Assists [place] cancer patients and their families through emotional , educational and financial support.  336-427-4357 ? [place] DSS Where to apply for  food stamps, Medicaid and utility assistance. 336-342-1394 ? [facility]: Transportation to medical appointments. 336-347-2287 ? Social Security Administration: May apply for disability if have a Stage IV cancer. 336-342-7796 1-800-772-1213 ? [place] Aging, Disability and Transit Services: Assists with nutrition, care and transit needs. 336-349-2343  Cancer Center Support Programs:   > Cancer Support Group  2nd Tuesday of the month 1pm-2pm, Journey Room   > Creative Journey  3rd Tuesday of the month 1130am-1pm, Journey Room    

## 2017-11-20 LAB — HEPATITIS PANEL, ACUTE
HCV Ab: <0.1 s/co ratio (ref 0.0–0.9)
HEP B C IGM: NEGATIVE
Hep A IgM: NEGATIVE
Hepatitis B Surface Ag: NEGATIVE

## 2017-11-20 LAB — HIV ANTIBODY (ROUTINE TESTING W REFLEX): HIV Screen 4th Generation wRfx: NONREACTIVE

## 2017-11-20 LAB — HEPATITIS B SURFACE ANTIBODY,QUALITATIVE: Hep B S Ab: NONREACTIVE

## 2017-11-20 LAB — HEPATITIS B SURFACE ANTIGEN: Hepatitis B Surface Ag: NEGATIVE

## 2017-11-20 LAB — HEPATITIS B CORE ANTIBODY, TOTAL: HEP B C TOTAL AB: NEGATIVE

## 2017-11-20 NOTE — Progress Notes (Signed)
Diagnosis CLL (chronic lymphocytic leukemia) (HCC) - Plan: Hepatitis B surface antibody, Hepatitis B surface antigen, Hepatitis B core antibody, total, Hepatitis panel, acute, Hepatitis C RNA quantitative, HIV antibody (with reflex), CBC with Differential/Platelet, Comprehensive metabolic panel, Lactate dehydrogenase, Uric acid  Staging Cancer Staging CLL (chronic lymphocytic leukemia) (HCC) Staging form: Chronic Lymphocytic Leukemia / Small Lymphocytic Lymphoma, AJCC 8th Edition - Clinical stage from 09/04/2016: Modified Rai Stage IV (Binet: Stage C, Modified Rai risk: High, Lymphocytosis: Present, Adenopathy: Present, Organomegaly: Present, Anemia: Absent, Thrombocytopenia: Present) - Signed by Baird Cancer, PA-C on 09/04/2016   Assessment and Plan:  Historical data obtained from note dated 11/08/2017.  1.   CLL.  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 Pt was previously followed by Dr. Talbert Cage.  CT neck/chest/abd/pelvis on 08/03/16 showed progressive adenopathy. 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 Progressive thrombocytopenia appreciated with platelets <100,000. Treatment initiated with Imbruvica 420 mg daily on 08/19/16.  Imbruvica stopped on 10/11/16 for subdural hematoma possibly secondary to fall in January 2018 vs. Effects of imbruvica.  Pt had previous discussion with Dr. Talbert Cage regarding recommendations due to discontinuation of  imbruvica permanently in light of the subdural hematoma.    Labs done 11/05/2017 reviewed with pt and showed WBC 99.9 which is increased from 82.6 on labs done in 07/2017.  Plt count is also decreased at 97,000 and was previously 111,000.  He has stable lymphocyte count at 94%.   I have discussed with him  thrombocytopenia has worsened and is now less than 100,000.  WBC has also increased.  Pt remains asymptomatic.    Pt is here to go over PET scan for restaging evaluation of adenopathy which was chosen due to worsening renal function.   Pet scan done  11/15/2017 showed  IMPRESSION: Diffuse hypermetabolic lymphadenopathy throughout the neck, chest, abdomen, and pelvis, without significant change in size compared to prior CT on 08/03/2016.  No new or progressive lymphadenopathy identified.  I have discussed options of therapy with  bendamustin + rituxan or chlorambucil + obinutuzumab. Patient would prefer an oral regimen.    He was provided written information about Chlorambucil and obinutuzumab.  I have discussed side effects of both medications.  He will be set up for teaching regarding Chlorambucil.  He does not desire to take obinutuzumab due to it being an infusional agent.  Chlorambucil will be dosed at 0.5 mg/kg D1 and D15 once therapy starts.  Labs will be monitored closely once therapy starts due to risk for cytopenias with chlorambucil and especially in light of pt history of SDH.   Have reviewed results of Hep B and C and HIV prior to therapy initiation and all testing was negative.  Based on weight and reduction due to RI dose should be 40 mg D1 and D15.  Will review with pharmacy prior to therapy initiation.    2.  CKD.  His Cr has increased to 3 from 1.6.  PET scan showed diffuse adenopathy that was unchanged from scan done 07/2016.  Uric acid was noted to be elevated at 7.3.  He is recommended for allopurinol 100 mg po daily and will recheck labs in 7-10 days.  Have encouraged hydration.  Follow-up with nephrology.    3.  TLS prophylaxis.  UA was noted to be 7.3 on labs done today 11/20/2017.  He is recommended for allopurinol 100 mg po daily.  Pt will have option of Rasburicase once therapy begins if Uric acid does not improve with allopurinol.  4.  Subdural hematoma s/p craniotomy/Headaches: Follow-up with Dr. Sherwood Gambler, neurosurgery.  I have discussed with him if IV access recommended he would not be able to have Heparin flushes.  Pt does not desire IV placement.    5.  HTN.  BP is 117/79.  Follow-up with PCP.     Current  Status:  Pt is seen today for follow-up.  He is here to go over PET scan.  He denies any B symptoms.      CLL (chronic lymphocytic leukemia) ([place])   03/09/2015 Initial Diagnosis    CLL (chronic lymphocytic leukemia) ([place])      08/03/2016 Imaging    CT neck- 1. Diffuse and bulky lymphadenopathy in keeping with history of CLL. Nodal enlargement has diffusely progressed since April 2016. 2. Chest and abdomen CT reported separately.      08/03/2016 Imaging    CT CAP- 1. Bulky lymphadenopathy in the chest, abdomen, and pelvis as described. 2. Splenomegaly. 3. Coronary artery and thoracoabdominal aortic atherosclerosis. 4. Prostatomegaly. 5. Several tiny lucent lesions in the bony anatomic pelvis, nonspecific, but attention on follow-up imaging recommended.      08/10/2016 Procedure    Bone marrow aspiration and biopsy by IR.      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 PERIPHERAL BLOOD: - CHRONIC LYMPHOCYTIC LEUKEMIA. - THROMBOCYTOPENIA.      08/11/2016 Pathology Results    Bone Marrow Flow Cytometry - CHRONIC LYMPHOCYTIC LEUKEMIA/SMALL LYMPHOCYTIC LYMPHOMA.      08/16/2016 Pathology Results    Normal cytogenetics.  Molecular cytogenetic analysis by [facility] demonstrates a 13q-      08/19/2016 -  Chemotherapy    Imbruvica 420 mg daily         Problem List Patient Active Problem List   Diagnosis Date Noted  . Subdural hematoma ([place]) [S06.5X9A] 10/12/2016  . A-fib ([place]) [I48.91] 10/12/2016  . Hypoxemia [R09.02] 05/13/2016  . Preventative health care [Z00.00] 03/06/2016  . CLL (chronic lymphocytic leukemia) ([place]) [C91.90] 03/09/2015    Past Medical History Past Medical History:  Diagnosis Date  . Atrial fibrillation ([place])   . Chronic kidney disease    stage 3  . Chronic lymphocytic leukemia ([place])   . CLL (chronic lymphocytic leukemia) ([place])    . Dysrhythmia    Hx. of Atrial Fibrillation- has resolved  . Hypertension   . Pinched nerve in neck     Past Surgical History Past Surgical History:  Procedure Laterality Date  . CATARACT EXTRACTION, BILATERAL    . COLONOSCOPY  07/10/2011   Procedure: COLONOSCOPY;  Surgeon: Dorothyann Peng, MD;  [facility];  Service: Endoscopy;  Laterality: N/A;  10:30 AM  . CRANIOTOMY N/A 10/14/2016   Procedure: CRANIOTOMY HEMATOMA EVACUATION SUBDURAL;  Surgeon: Jovita Gamma, MD;  Location: [facility];  Service: Neurosurgery;  Laterality: N/A;  CRANIOTOMY HEMATOMA EVACUATION SUBDURAL  . THYROIDECTOMY, PARTIAL    . TONSILLECTOMY      Family History Family History  Problem Relation Age of Onset  . CAD Father 4  . Cancer Mother   . Alzheimer's disease Mother   . Colon cancer Neg Hx      Social History  reports that he quit smoking about 46 years ago. He has a 15.00 pack-year smoking history. He has never used smokeless tobacco. He reports that he does not drink alcohol or use drugs.  Medications  Current Outpatient Medications:  .  amLODipine (NORVASC) 5 MG tablet, TAKE 1 TABLET BY MOUTH ONCE DAILY, Disp: 90 tablet, Rfl: 3 .  carvedilol (COREG) 12.5 MG tablet, Take 12.5 mg by mouth 2 (two) times daily with a meal., Disp: , Rfl:  .  Coenzyme Q10 (COQ10) 100 MG CAPS, Take 100 mg by mouth daily. , Disp: , Rfl:  .  Multiple Vitamin (MULITIVITAMIN WITH MINERALS) TABS, Take 1 tablet by mouth daily., Disp: , Rfl:  .  tamsulosin (FLOMAX) 0.4 MG CAPS capsule, Take 1 capsule by mouth daily., Disp: , Rfl:  .  telmisartan (MICARDIS) 80 MG tablet, Take 1 tablet by mouth daily., Disp: , Rfl:   Allergies Patient has no known allergies.  Review of Systems Review of Systems - Oncology ROS negative   Physical Exam  Vitals Wt Readings from Last 3 Encounters:  11/19/17 230 lb 4.8 oz (104.5 kg)  11/08/17 231 lb 9.6 oz (105.1 kg)  10/08/17 232 lb 3.2 oz (105.3 kg)   Temp Readings from  Last 3 Encounters:  11/19/17 98.1 F (36.7 C) (Oral)  11/08/17 98 F (36.7 C) (Oral)  08/13/17 97.6 F (36.4 C) (Oral)   BP Readings from Last 3 Encounters:  11/19/17 117/79  11/08/17 128/71  10/08/17 120/70   Pulse Readings from Last 3 Encounters:  11/19/17 78  11/08/17 69  10/08/17 78   Constitutional: Well-developed, well-nourished, and in no distress.   HENT: Head: Normocephalic and atraumatic.  Mouth/Throat: No oropharyngeal exudate. Mucosa moist. Eyes: Pupils are equal, round, and reactive to light. Conjunctivae are normal. No scleral icterus.  Neck: Normal range of motion. Neck supple. No JVD present.  Cardiovascular: Normal rate, regular rhythm and normal heart sounds.  Exam reveals no gallop and no friction rub.   No murmur heard. Pulmonary/Chest: Effort normal and breath sounds normal. No respiratory distress. No wheezes.No rales.  Abdominal: Soft. Bowel sounds are normal. No distension. There is no tenderness. There is no guarding.  Musculoskeletal: No edema or tenderness.  Lymphadenopathy:Palpable cervical adenopathy.  Neurological: Alert and oriented to person, place, and time. No cranial nerve deficit.  Skin: Skin is warm and dry. No rash noted. No erythema. No pallor.  Psychiatric: Affect and judgment normal.   Labs Appointment on 11/19/2017  Component Date Value Ref Range Status  . Uric Acid, Serum 11/19/2017 7.3  4.4 - 7.6 mg/dL Final   Performed at [facility], [address], [place], [place] [address]  . Hep B S Ab 11/19/2017 Non Reactive   Final   Comment: (NOTE)              Non Reactive: Inconsistent with immunity,                            less than 10 mIU/mL              Reactive:     Consistent with immunity,                            greater than 9.9 mIU/mL Performed At: [facility] [place], [place] [id number] Rush Farmer MD TO:[id number] Performed at [facility], [address] [place], [place] [address]   .  Hepatitis B Surface Ag 11/19/2017 Negative  Negative Final   Comment: (NOTE) Performed At: [facility] [place], [place] [id number] Rush Farmer MD [id number] Performed at [facility], [address] [place],  [place] [address]   . Hepatitis B Surface Ag 11/19/2017 Negative  Negative Final  . HCV Ab 11/19/2017 <0.1  0.0 - 0.9 s/co ratio Final   Comment: (NOTE)                                  Negative:     < 0.8                             Indeterminate: 0.8 - 0.9                                  Positive:     > 0.9 The CDC recommends that a positive HCV antibody result be followed up with a HCV Nucleic Acid Amplification test ([id number]). Performed At: [facility] [place], [place] [id number] Rush Farmer MD [id number]   . Hep A IgM 11/19/2017 Negative  Negative Final  . Hep B C IgM 11/19/2017 Negative  Negative Final   Performed at [facility], [address], [place], [place] [address]  Office Visit on 11/19/2017  Component Date Value Ref Range Status  . Hep B Core Total Ab 11/19/2017 Negative  Negative Final   Comment: (NOTE) Performed At: [facility] [place], [place] [id number] Rush Farmer MD [id number] Performed at [facility], [address], [place], [place] [address]   . HIV Screen 4th Generation wRfx 11/19/2017 Non Reactive  Non Reactive Final   Comment: (NOTE) Performed At: [facility] [place], [place] [id number] Rush Farmer MD [id number] Performed at [facility], [address], [place], [place] [address]      Pathology Orders Placed This Encounter  Procedures  . Hepatitis B surface antibody    Standing Status:   Future    Number of Occurrences:   1    Standing Expiration Date:   11/19/2018  . Hepatitis B surface antigen    Standing Status:   Future    Number of Occurrences:   1    Standing Expiration Date:   11/19/2018  . Hepatitis B core  antibody, total  . Hepatitis panel, acute    Standing Status:   Future    Number of Occurrences:   1    Standing Expiration Date:   11/19/2018  . Hepatitis C RNA quantitative    Standing Status:   Future    Number of Occurrences:   1    Standing Expiration Date:   11/19/2018  . HIV antibody (with reflex)  . CBC with Differential/Platelet    Standing Status:   Future    Standing Expiration Date:   11/20/2018  . Comprehensive metabolic panel    Standing Status:   Future    Standing Expiration Date:   11/20/2018  . Lactate dehydrogenase    Standing Status:   Future    Standing Expiration Date:   11/20/2018  . Uric acid    Standing Status:   Future    Standing Expiration Date:   11/20/2018       Zoila Shutter MD

## 2017-11-21 ENCOUNTER — Other Ambulatory Visit (HOSPITAL_COMMUNITY): Payer: Self-pay | Admitting: Pharmacist

## 2017-11-21 ENCOUNTER — Other Ambulatory Visit (HOSPITAL_COMMUNITY): Payer: Self-pay | Admitting: *Deleted

## 2017-11-21 DIAGNOSIS — C9110 Chronic lymphocytic leukemia of B-cell type not having achieved remission: Secondary | ICD-10-CM

## 2017-11-21 MED ORDER — ALLOPURINOL 100 MG PO TABS: 100.0000 mg | ORAL_TABLET | Freq: Every day | ORAL | 0 refills | Status: DC

## 2017-11-21 MED ORDER — CHLORAMBUCIL 2 MG PO TABS: ORAL_TABLET | ORAL | 0 refills | Status: DC

## 2017-11-21 NOTE — Progress Notes (Unsigned)
Chlorambucil 40mg  Day 1 and Day 15 escribed to [facility] per Dr. Wenda Low orders. (dosing approved by Milbert Coulter Pharmacist prior to being sent to pharmacy). Allopurinol 100mg  escribed to [facility] and patient made aware. Dr. Walden Field prefers that patient not start treatment until after lab work on 7/8 to see what his renal function and uric acid levels area. However, if patient becomes symptomatic - may start Chlorambucil. Patient also may need Elitek prior to start of Chlormabucil. Patient to call Hildred Alamin if he is having any symptoms, questions, or concerns.

## 2017-12-03 ENCOUNTER — Inpatient Hospital Stay (HOSPITAL_COMMUNITY): Payer: Medicare HMO | Attending: Hematology

## 2017-12-03 ENCOUNTER — Other Ambulatory Visit (HOSPITAL_COMMUNITY): Payer: Self-pay | Admitting: Internal Medicine

## 2017-12-03 ENCOUNTER — Encounter (HOSPITAL_COMMUNITY): Payer: Self-pay | Admitting: *Deleted

## 2017-12-03 DIAGNOSIS — C9190 Lymphoid leukemia, unspecified not having achieved remission: Secondary | ICD-10-CM | POA: Insufficient documentation

## 2017-12-03 DIAGNOSIS — Z79899 Other long term (current) drug therapy: Secondary | ICD-10-CM | POA: Diagnosis not present

## 2017-12-03 DIAGNOSIS — C9110 Chronic lymphocytic leukemia of B-cell type not having achieved remission: Secondary | ICD-10-CM

## 2017-12-03 DIAGNOSIS — D696 Thrombocytopenia, unspecified: Secondary | ICD-10-CM | POA: Diagnosis not present

## 2017-12-03 DIAGNOSIS — I2510 Atherosclerotic heart disease of native coronary artery without angina pectoris: Secondary | ICD-10-CM | POA: Diagnosis not present

## 2017-12-03 DIAGNOSIS — E790 Hyperuricemia without signs of inflammatory arthritis and tophaceous disease: Secondary | ICD-10-CM | POA: Diagnosis not present

## 2017-12-03 DIAGNOSIS — S065X9A Traumatic subdural hemorrhage with loss of consciousness of unspecified duration, initial encounter: Secondary | ICD-10-CM | POA: Diagnosis not present

## 2017-12-03 DIAGNOSIS — N189 Chronic kidney disease, unspecified: Secondary | ICD-10-CM | POA: Diagnosis not present

## 2017-12-03 DIAGNOSIS — I129 Hypertensive chronic kidney disease with stage 1 through stage 4 chronic kidney disease, or unspecified chronic kidney disease: Secondary | ICD-10-CM | POA: Insufficient documentation

## 2017-12-03 DIAGNOSIS — I700 Atherosclerosis of aorta: Secondary | ICD-10-CM | POA: Diagnosis not present

## 2017-12-03 DIAGNOSIS — N400 Enlarged prostate without lower urinary tract symptoms: Secondary | ICD-10-CM | POA: Insufficient documentation

## 2017-12-03 LAB — RENAL FUNCTION PANEL
ALBUMIN: 4.3 g/dL (ref 3.5–5.0)
ANION GAP: 9 (ref 5–15)
BUN: 47 mg/dL — ABNORMAL HIGH (ref 8–23)
CALCIUM: 10.7 mg/dL — AB (ref 8.9–10.3)
CO2: 24 mmol/L (ref 22–32)
CREATININE: 3.92 mg/dL — AB (ref 0.61–1.24)
Chloride: 108 mmol/L (ref 98–111)
GFR, EST AFRICAN AMERICAN: 16 mL/min — AB (ref >60)
GFR, EST NON AFRICAN AMERICAN: 14 mL/min — AB (ref >60)
Glucose, Bld: 107 mg/dL — ABNORMAL HIGH (ref 70–99)
PHOSPHORUS: 4.6 mg/dL (ref 2.5–4.6)
Potassium: 4.3 mmol/L (ref 3.5–5.1)
SODIUM: 141 mmol/L (ref 135–145)

## 2017-12-03 LAB — URIC ACID: Uric Acid, Serum: 6.7 mg/dL (ref 3.7–8.6)

## 2017-12-03 MED ORDER — SODIUM CHLORIDE 0.9 % IV SOLN
Freq: Once | INTRAVENOUS | Status: AC
  Administered 2017-12-06: 12:00:00 via INTRAVENOUS

## 2017-12-03 MED ORDER — SODIUM CHLORIDE 0.9 % IV SOLN: 3.0000 mg | Freq: Once | INTRAVENOUS | Status: DC

## 2017-12-03 NOTE — Progress Notes (Signed)
Patient's lab results reviewed with Dr. Walden Field.  She wants to bring patient in for IV fluids and rasburicase infusion.  Patient was notified via telephone and he is aware of his appointment on 7/11 at 11:15.  Patient was advised to bring his Chlorambucil with him and not to take it prior to appointment.

## 2017-12-06 ENCOUNTER — Inpatient Hospital Stay (HOSPITAL_COMMUNITY): Payer: Medicare HMO

## 2017-12-06 ENCOUNTER — Encounter (HOSPITAL_COMMUNITY): Payer: Self-pay

## 2017-12-06 VITALS — BP 111/68 | HR 68 | Temp 97.6°F | Resp 18 | Wt 226.0 lb

## 2017-12-06 DIAGNOSIS — I700 Atherosclerosis of aorta: Secondary | ICD-10-CM | POA: Diagnosis not present

## 2017-12-06 DIAGNOSIS — Z79899 Other long term (current) drug therapy: Secondary | ICD-10-CM | POA: Diagnosis not present

## 2017-12-06 DIAGNOSIS — C9110 Chronic lymphocytic leukemia of B-cell type not having achieved remission: Secondary | ICD-10-CM

## 2017-12-06 DIAGNOSIS — N400 Enlarged prostate without lower urinary tract symptoms: Secondary | ICD-10-CM | POA: Diagnosis not present

## 2017-12-06 DIAGNOSIS — I2510 Atherosclerotic heart disease of native coronary artery without angina pectoris: Secondary | ICD-10-CM | POA: Diagnosis not present

## 2017-12-06 DIAGNOSIS — I129 Hypertensive chronic kidney disease with stage 1 through stage 4 chronic kidney disease, or unspecified chronic kidney disease: Secondary | ICD-10-CM | POA: Diagnosis not present

## 2017-12-06 DIAGNOSIS — E790 Hyperuricemia without signs of inflammatory arthritis and tophaceous disease: Secondary | ICD-10-CM

## 2017-12-06 DIAGNOSIS — C9190 Lymphoid leukemia, unspecified not having achieved remission: Secondary | ICD-10-CM | POA: Diagnosis not present

## 2017-12-06 DIAGNOSIS — N189 Chronic kidney disease, unspecified: Secondary | ICD-10-CM | POA: Diagnosis not present

## 2017-12-06 DIAGNOSIS — S065X9A Traumatic subdural hemorrhage with loss of consciousness of unspecified duration, initial encounter: Secondary | ICD-10-CM | POA: Diagnosis not present

## 2017-12-06 DIAGNOSIS — D696 Thrombocytopenia, unspecified: Secondary | ICD-10-CM | POA: Diagnosis not present

## 2017-12-06 MED ORDER — SODIUM CHLORIDE 0.9 % IV SOLN
INTRAVENOUS | Status: DC
  Administered 2017-12-06: 13:00:00 via INTRAVENOUS

## 2017-12-06 MED ORDER — SODIUM CHLORIDE 0.9 % IV SOLN
3.0000 mg | Freq: Once | INTRAVENOUS | Status: AC
  Administered 2017-12-06: 3 mg via INTRAVENOUS
  Filled 2017-12-06: qty 2

## 2017-12-06 NOTE — Patient Instructions (Signed)
[place] at [facility] Discharge Instructions  Received hydration and Eletek infusion today. Follow-up as scheduled. Call clinic for any questions or concerns   Thank you for choosing [place] at [facility] to provide your oncology and hematology care.  To afford each patient quality time with our provider, please arrive at least 15 minutes before your scheduled appointment time.   If you have a lab appointment with the [facility] please come in thru the  Main Entrance and check in at the main information desk  You need to re-schedule your appointment should you arrive 10 or more minutes late.  We strive to give you quality time with our providers, and arriving late affects you and other patients whose appointments are after yours.  Also, if you no show three or more times for appointments you may be dismissed from the clinic at the providers discretion.     Again, thank you for choosing [facility].  Our hope is that these requests will decrease the amount of time that you wait before being seen by our physicians.       _____________________________________________________________  Should you have questions after your visit to [facility], please contact our office at [phone] between the hours of 8:30 a.m. and 4:30 p.m.  Voicemails left after 4:30 p.m. will not be returned until the following business day.  For prescription refill requests, have your pharmacy contact our office.       Resources For Cancer Patients and their Caregivers ? American Cancer Society: Can assist with transportation, wigs, general needs, runs Look Good Feel Better.        (407)758-4815 ? Cancer Care: Provides financial assistance, online support groups, medication/co-pay assistance.  1-800-813-HOPE 236-135-7812) ? [place] Assists [place] cancer patients and their families through emotional ,  educational and financial support.  340-330-5729 ? [place] DSS Where to apply for food stamps, Medicaid and utility assistance. 289-688-4929 ? [employer]: Transportation to medical appointments. (249)616-7489 ? Social Security Administration: May apply for disability if have a Stage IV cancer. 670-596-0719 445-777-7933 ? [employer], Disability and Transit Services: Assists with nutrition, care and transit needs. [place] Support Programs:   > Cancer Support Group  2nd Tuesday of the month 1pm-2pm, Journey Room   > Creative Journey  3rd Tuesday of the month 1130am-1pm, Journey Room

## 2017-12-06 NOTE — Progress Notes (Signed)
Andrew [name] tolerated Elitek infusion and hydration well without complaints or incident. Pt to repeat labs on Monday 7/15 before starting Leukeran PO per MD. Peripheral IV site with positive blood return prior to,during and after infusions. VSS upon discharge. Pt discharged self ambulatory in satisfactory condition accompanied by his wife

## 2017-12-10 ENCOUNTER — Inpatient Hospital Stay (HOSPITAL_COMMUNITY): Payer: Medicare HMO

## 2017-12-10 ENCOUNTER — Telehealth (HOSPITAL_COMMUNITY): Payer: Self-pay | Admitting: Emergency Medicine

## 2017-12-10 DIAGNOSIS — Z08 Encounter for follow-up examination after completed treatment for malignant neoplasm: Secondary | ICD-10-CM | POA: Diagnosis not present

## 2017-12-10 DIAGNOSIS — N400 Enlarged prostate without lower urinary tract symptoms: Secondary | ICD-10-CM | POA: Diagnosis not present

## 2017-12-10 DIAGNOSIS — C9110 Chronic lymphocytic leukemia of B-cell type not having achieved remission: Secondary | ICD-10-CM

## 2017-12-10 DIAGNOSIS — E790 Hyperuricemia without signs of inflammatory arthritis and tophaceous disease: Secondary | ICD-10-CM | POA: Diagnosis not present

## 2017-12-10 DIAGNOSIS — Z79899 Other long term (current) drug therapy: Secondary | ICD-10-CM | POA: Diagnosis not present

## 2017-12-10 DIAGNOSIS — S065X9A Traumatic subdural hemorrhage with loss of consciousness of unspecified duration, initial encounter: Secondary | ICD-10-CM | POA: Diagnosis not present

## 2017-12-10 DIAGNOSIS — I129 Hypertensive chronic kidney disease with stage 1 through stage 4 chronic kidney disease, or unspecified chronic kidney disease: Secondary | ICD-10-CM | POA: Diagnosis not present

## 2017-12-10 DIAGNOSIS — C4442 Squamous cell carcinoma of skin of scalp and neck: Secondary | ICD-10-CM | POA: Diagnosis not present

## 2017-12-10 DIAGNOSIS — D696 Thrombocytopenia, unspecified: Secondary | ICD-10-CM | POA: Diagnosis not present

## 2017-12-10 DIAGNOSIS — N189 Chronic kidney disease, unspecified: Secondary | ICD-10-CM | POA: Diagnosis not present

## 2017-12-10 DIAGNOSIS — D2371 Other benign neoplasm of skin of right lower limb, including hip: Secondary | ICD-10-CM | POA: Diagnosis not present

## 2017-12-10 DIAGNOSIS — C9190 Lymphoid leukemia, unspecified not having achieved remission: Secondary | ICD-10-CM | POA: Diagnosis not present

## 2017-12-10 DIAGNOSIS — I2510 Atherosclerotic heart disease of native coronary artery without angina pectoris: Secondary | ICD-10-CM | POA: Diagnosis not present

## 2017-12-10 DIAGNOSIS — I700 Atherosclerosis of aorta: Secondary | ICD-10-CM | POA: Diagnosis not present

## 2017-12-10 DIAGNOSIS — Z85828 Personal history of other malignant neoplasm of skin: Secondary | ICD-10-CM | POA: Diagnosis not present

## 2017-12-10 LAB — COMPREHENSIVE METABOLIC PANEL
ALT: 12 U/L (ref 0–44)
AST: 18 U/L (ref 15–41)
Albumin: 4.1 g/dL (ref 3.5–5.0)
Alkaline Phosphatase: 73 U/L (ref 38–126)
Anion gap: 8 (ref 5–15)
BUN: 54 mg/dL — ABNORMAL HIGH (ref 8–23)
CHLORIDE: 108 mmol/L (ref 98–111)
CO2: 24 mmol/L (ref 22–32)
Calcium: 10.6 mg/dL — ABNORMAL HIGH (ref 8.9–10.3)
Creatinine, Ser: 4.76 mg/dL — ABNORMAL HIGH (ref 0.61–1.24)
GFR, EST AFRICAN AMERICAN: 12 mL/min — AB (ref >60)
GFR, EST NON AFRICAN AMERICAN: 11 mL/min — AB (ref >60)
Glucose, Bld: 95 mg/dL (ref 70–99)
POTASSIUM: 4.3 mmol/L (ref 3.5–5.1)
SODIUM: 140 mmol/L (ref 135–145)
Total Bilirubin: 1.1 mg/dL (ref 0.3–1.2)
Total Protein: 6.6 g/dL (ref 6.5–8.1)

## 2017-12-10 LAB — CBC WITH DIFFERENTIAL/PLATELET
Basophils Absolute: 0.2 10*3/uL — ABNORMAL HIGH (ref 0.0–0.1)
Basophils Relative: 0 %
Eosinophils Absolute: 0.3 10*3/uL (ref 0.0–0.7)
Eosinophils Relative: 0 %
HCT: 33.8 % — ABNORMAL LOW (ref 39.0–52.0)
Hemoglobin: 11.2 g/dL — ABNORMAL LOW (ref 13.0–17.0)
LYMPHS PCT: 94 %
Lymphs Abs: 87.1 10*3/uL (ref 0.7–4.0)
MCH: 31 pg (ref 26.0–34.0)
MCHC: 33.1 g/dL (ref 30.0–36.0)
MCV: 93.6 fL (ref 78.0–100.0)
Monocytes Absolute: 1.4 10*3/uL (ref 0.1–1.0)
Monocytes Relative: 1 %
NEUTROS ABS: 4.8 10*3/uL (ref 1.7–7.7)
Neutrophils Relative %: 5 %
Platelets: 93 10*3/uL — ABNORMAL LOW (ref 150–400)
RBC: 3.61 MIL/uL — ABNORMAL LOW (ref 4.22–5.81)
RDW: 13.6 % (ref 11.5–15.5)
WBC: 93.8 10*3/uL (ref 4.0–10.5)

## 2017-12-10 LAB — URIC ACID: URIC ACID, SERUM: 4.1 mg/dL (ref 3.7–8.6)

## 2017-12-10 LAB — LACTATE DEHYDROGENASE: LDH: 112 U/L (ref 98–192)

## 2017-12-10 NOTE — Progress Notes (Unsigned)
CRITICAL VALUE ALERT  Critical Value:   WBC 93.8  Date & Time Notied:   12/10/17 10:52   Provider Notified:  Dr Walden Field

## 2017-12-10 NOTE — Telephone Encounter (Signed)
Dr Walden Field reviewed labs and advised Rn to call pt with instruction to start medication today and remind him the second dose with be Day 15. Pt has a follow up appointment July 22nd. Keep that appointment and call if he has any problems.

## 2017-12-13 ENCOUNTER — Other Ambulatory Visit (HOSPITAL_COMMUNITY): Payer: Self-pay | Admitting: *Deleted

## 2017-12-13 DIAGNOSIS — C9110 Chronic lymphocytic leukemia of B-cell type not having achieved remission: Secondary | ICD-10-CM

## 2017-12-13 MED ORDER — CHLORAMBUCIL 2 MG PO TABS: ORAL_TABLET | ORAL | 0 refills | Status: DC

## 2017-12-13 NOTE — Telephone Encounter (Signed)
Chart reviewed, per Dr. Walden Field last note, I refilled patient's chlorambucil

## 2017-12-17 ENCOUNTER — Inpatient Hospital Stay (HOSPITAL_COMMUNITY): Payer: Medicare HMO | Admitting: Internal Medicine

## 2017-12-17 ENCOUNTER — Encounter (HOSPITAL_COMMUNITY): Payer: Self-pay | Admitting: Internal Medicine

## 2017-12-17 ENCOUNTER — Emergency Department (HOSPITAL_COMMUNITY): Payer: Medicare HMO

## 2017-12-17 ENCOUNTER — Inpatient Hospital Stay (HOSPITAL_COMMUNITY)
Admission: EM | Admit: 2017-12-17 | Discharge: 2017-12-22 | DRG: 840 | Disposition: A | Discharge status: Home / Self Care | Payer: Medicare HMO | Source: Ambulatory Visit | Attending: Internal Medicine | Admitting: Internal Medicine

## 2017-12-17 ENCOUNTER — Other Ambulatory Visit (HOSPITAL_COMMUNITY): Payer: Self-pay | Admitting: Nephrology

## 2017-12-17 ENCOUNTER — Other Ambulatory Visit: Payer: Self-pay

## 2017-12-17 ENCOUNTER — Telehealth (HOSPITAL_COMMUNITY): Payer: Self-pay | Admitting: Emergency Medicine

## 2017-12-17 ENCOUNTER — Inpatient Hospital Stay (HOSPITAL_COMMUNITY): Payer: Medicare HMO

## 2017-12-17 ENCOUNTER — Encounter (HOSPITAL_COMMUNITY): Payer: Self-pay | Admitting: Emergency Medicine

## 2017-12-17 VITALS — BP 118/69 | HR 73 | Temp 98.3°F | Resp 18 | Wt 223.8 lb

## 2017-12-17 DIAGNOSIS — S065X9A Traumatic subdural hemorrhage with loss of consciousness of unspecified duration, initial encounter: Secondary | ICD-10-CM

## 2017-12-17 DIAGNOSIS — D239 Other benign neoplasm of skin, unspecified: Secondary | ICD-10-CM | POA: Diagnosis present

## 2017-12-17 DIAGNOSIS — I10 Essential (primary) hypertension: Secondary | ICD-10-CM | POA: Diagnosis not present

## 2017-12-17 DIAGNOSIS — N189 Chronic kidney disease, unspecified: Secondary | ICD-10-CM

## 2017-12-17 DIAGNOSIS — E790 Hyperuricemia without signs of inflammatory arthritis and tophaceous disease: Secondary | ICD-10-CM

## 2017-12-17 DIAGNOSIS — C9110 Chronic lymphocytic leukemia of B-cell type not having achieved remission: Secondary | ICD-10-CM | POA: Diagnosis not present

## 2017-12-17 DIAGNOSIS — I878 Other specified disorders of veins: Secondary | ICD-10-CM | POA: Diagnosis present

## 2017-12-17 DIAGNOSIS — D630 Anemia in neoplastic disease: Secondary | ICD-10-CM | POA: Diagnosis present

## 2017-12-17 DIAGNOSIS — N12 Tubulo-interstitial nephritis, not specified as acute or chronic: Secondary | ICD-10-CM | POA: Diagnosis present

## 2017-12-17 DIAGNOSIS — I129 Hypertensive chronic kidney disease with stage 1 through stage 4 chronic kidney disease, or unspecified chronic kidney disease: Secondary | ICD-10-CM

## 2017-12-17 DIAGNOSIS — C9190 Lymphoid leukemia, unspecified not having achieved remission: Secondary | ICD-10-CM

## 2017-12-17 DIAGNOSIS — N179 Acute kidney failure, unspecified: Secondary | ICD-10-CM | POA: Diagnosis present

## 2017-12-17 DIAGNOSIS — I480 Paroxysmal atrial fibrillation: Secondary | ICD-10-CM | POA: Diagnosis not present

## 2017-12-17 DIAGNOSIS — N185 Chronic kidney disease, stage 5: Secondary | ICD-10-CM

## 2017-12-17 DIAGNOSIS — D696 Thrombocytopenia, unspecified: Secondary | ICD-10-CM

## 2017-12-17 DIAGNOSIS — N400 Enlarged prostate without lower urinary tract symptoms: Secondary | ICD-10-CM | POA: Diagnosis not present

## 2017-12-17 DIAGNOSIS — Z79899 Other long term (current) drug therapy: Secondary | ICD-10-CM | POA: Diagnosis not present

## 2017-12-17 DIAGNOSIS — I445 Left posterior fascicular block: Secondary | ICD-10-CM | POA: Diagnosis present

## 2017-12-17 DIAGNOSIS — T451X5A Adverse effect of antineoplastic and immunosuppressive drugs, initial encounter: Secondary | ICD-10-CM | POA: Diagnosis present

## 2017-12-17 DIAGNOSIS — L7682 Other postprocedural complications of skin and subcutaneous tissue: Secondary | ICD-10-CM | POA: Diagnosis present

## 2017-12-17 DIAGNOSIS — I700 Atherosclerosis of aorta: Secondary | ICD-10-CM | POA: Diagnosis not present

## 2017-12-17 DIAGNOSIS — N183 Chronic kidney disease, stage 3 (moderate): Secondary | ICD-10-CM | POA: Diagnosis present

## 2017-12-17 DIAGNOSIS — Z809 Family history of malignant neoplasm, unspecified: Secondary | ICD-10-CM | POA: Diagnosis not present

## 2017-12-17 DIAGNOSIS — Z87891 Personal history of nicotine dependence: Secondary | ICD-10-CM | POA: Diagnosis not present

## 2017-12-17 DIAGNOSIS — I2510 Atherosclerotic heart disease of native coronary artery without angina pectoris: Secondary | ICD-10-CM

## 2017-12-17 DIAGNOSIS — N3289 Other specified disorders of bladder: Secondary | ICD-10-CM | POA: Diagnosis not present

## 2017-12-17 DIAGNOSIS — Y848 Other medical procedures as the cause of abnormal reaction of the patient, or of later complication, without mention of misadventure at the time of the procedure: Secondary | ICD-10-CM | POA: Diagnosis present

## 2017-12-17 DIAGNOSIS — Z8679 Personal history of other diseases of the circulatory system: Secondary | ICD-10-CM

## 2017-12-17 DIAGNOSIS — Z5329 Procedure and treatment not carried out because of patient's decision for other reasons: Secondary | ICD-10-CM | POA: Diagnosis present

## 2017-12-17 DIAGNOSIS — E860 Dehydration: Secondary | ICD-10-CM | POA: Diagnosis present

## 2017-12-17 DIAGNOSIS — E883 Tumor lysis syndrome: Secondary | ICD-10-CM | POA: Diagnosis present

## 2017-12-17 DIAGNOSIS — L03115 Cellulitis of right lower limb: Secondary | ICD-10-CM | POA: Diagnosis present

## 2017-12-17 DIAGNOSIS — T465X5A Adverse effect of other antihypertensive drugs, initial encounter: Secondary | ICD-10-CM | POA: Diagnosis present

## 2017-12-17 DIAGNOSIS — N200 Calculus of kidney: Secondary | ICD-10-CM | POA: Diagnosis not present

## 2017-12-17 DIAGNOSIS — I4891 Unspecified atrial fibrillation: Secondary | ICD-10-CM | POA: Diagnosis not present

## 2017-12-17 DIAGNOSIS — C7901 Secondary malignant neoplasm of right kidney and renal pelvis: Secondary | ICD-10-CM | POA: Diagnosis present

## 2017-12-17 DIAGNOSIS — D72820 Lymphocytosis (symptomatic): Secondary | ICD-10-CM | POA: Diagnosis not present

## 2017-12-17 DIAGNOSIS — E875 Hyperkalemia: Secondary | ICD-10-CM | POA: Diagnosis present

## 2017-12-17 DIAGNOSIS — E785 Hyperlipidemia, unspecified: Secondary | ICD-10-CM | POA: Diagnosis not present

## 2017-12-17 LAB — COMPREHENSIVE METABOLIC PANEL
ALT: 14 U/L (ref 0–44)
AST: 16 U/L (ref 15–41)
Albumin: 4.2 g/dL (ref 3.5–5.0)
Alkaline Phosphatase: 69 U/L (ref 38–126)
Anion gap: 10 (ref 5–15)
BUN: 67 mg/dL — AB (ref 8–23)
CO2: 21 mmol/L — AB (ref 22–32)
CREATININE: 5.22 mg/dL — AB (ref 0.61–1.24)
Calcium: 10.8 mg/dL — ABNORMAL HIGH (ref 8.9–10.3)
Chloride: 107 mmol/L (ref 98–111)
GFR calc Af Amer: 11 mL/min — ABNORMAL LOW (ref >60)
GFR calc non Af Amer: 10 mL/min — ABNORMAL LOW (ref >60)
Glucose, Bld: 96 mg/dL (ref 70–99)
Potassium: 4.4 mmol/L (ref 3.5–5.1)
Sodium: 138 mmol/L (ref 135–145)
Total Bilirubin: 1.1 mg/dL (ref 0.3–1.2)
Total Protein: 6.7 g/dL (ref 6.5–8.1)

## 2017-12-17 LAB — CBC WITH DIFFERENTIAL/PLATELET
BASOS PCT: 0 %
Basophils Absolute: 0 10*3/uL (ref 0.0–0.1)
Basophils Absolute: 0.9 K/uL — ABNORMAL HIGH (ref 0.0–0.1)
Basophils Relative: 1 %
EOS PCT: 0 %
Eosinophils Absolute: 0 10*3/uL (ref 0.0–0.7)
Eosinophils Absolute: 0 K/uL (ref 0.0–0.7)
Eosinophils Relative: 0 %
HCT: 33.9 % — ABNORMAL LOW (ref 39.0–52.0)
HCT: 34.9 % — ABNORMAL LOW (ref 39.0–52.0)
Hemoglobin: 11 g/dL — ABNORMAL LOW (ref 13.0–17.0)
Hemoglobin: 10.9 g/dL — ABNORMAL LOW (ref 13.0–17.0)
LYMPHS PCT: 94 %
Lymphocytes Relative: 91 %
Lymphs Abs: 73.6 10*3/uL — ABNORMAL HIGH (ref 0.7–4.0)
Lymphs Abs: 81.7 K/uL — ABNORMAL HIGH (ref 0.7–4.0)
MCH: 30.7 pg (ref 26.0–34.0)
MCH: 30.1 pg (ref 26.0–34.0)
MCHC: 32.4 g/dL (ref 30.0–36.0)
MCHC: 31.2 g/dL (ref 30.0–36.0)
MCV: 94.7 fL (ref 78.0–100.0)
MCV: 96.4 fL (ref 78.0–100.0)
Monocytes Absolute: 0.8 10*3/uL (ref 0.1–1.0)
Monocytes Absolute: 2.7 K/uL — ABNORMAL HIGH (ref 0.1–1.0)
Monocytes Relative: 1 %
Monocytes Relative: 3 %
Neutro Abs: 3.9 10*3/uL (ref 1.7–7.7)
Neutro Abs: 4.5 K/uL (ref 1.7–7.7)
Neutrophils Relative %: 5 %
Neutrophils Relative %: 5 %
Platelets: 102 10*3/uL — ABNORMAL LOW (ref 150–400)
Platelets: 112 K/uL — ABNORMAL LOW (ref 150–400)
RBC: 3.58 MIL/uL — ABNORMAL LOW (ref 4.22–5.81)
RBC: 3.62 MIL/uL — ABNORMAL LOW (ref 4.22–5.81)
RDW: 13.7 % (ref 11.5–15.5)
RDW: 13.4 % (ref 11.5–15.5)
WBC: 78.3 10*3/uL (ref 4.0–10.5)
WBC: 89.8 K/uL (ref 4.0–10.5)

## 2017-12-17 LAB — URINALYSIS, ROUTINE W REFLEX MICROSCOPIC
Bacteria, UA: NONE SEEN
Bilirubin Urine: NEGATIVE
Glucose, UA: NEGATIVE mg/dL
Ketones, ur: NEGATIVE mg/dL
Nitrite: NEGATIVE
Protein, ur: 30 mg/dL — AB
Specific Gravity, Urine: 1.01 (ref 1.005–1.030)
pH: 6 (ref 5.0–8.0)

## 2017-12-17 LAB — COMPREHENSIVE METABOLIC PANEL WITH GFR
ALT: 15 U/L (ref 0–44)
AST: 29 U/L (ref 15–41)
Albumin: 4.2 g/dL (ref 3.5–5.0)
Alkaline Phosphatase: 72 U/L (ref 38–126)
Anion gap: 10 (ref 5–15)
BUN: 66 mg/dL — ABNORMAL HIGH (ref 8–23)
CO2: 21 mmol/L — ABNORMAL LOW (ref 22–32)
Calcium: 10.9 mg/dL — ABNORMAL HIGH (ref 8.9–10.3)
Chloride: 105 mmol/L (ref 98–111)
Creatinine, Ser: 5.32 mg/dL — ABNORMAL HIGH (ref 0.61–1.24)
GFR calc Af Amer: 11 mL/min — ABNORMAL LOW
GFR calc non Af Amer: 9 mL/min — ABNORMAL LOW
Glucose, Bld: 87 mg/dL (ref 70–99)
Potassium: 5.8 mmol/L — ABNORMAL HIGH (ref 3.5–5.1)
Sodium: 136 mmol/L (ref 135–145)
Total Bilirubin: 1.1 mg/dL (ref 0.3–1.2)
Total Protein: 6.5 g/dL (ref 6.5–8.1)

## 2017-12-17 LAB — LACTATE DEHYDROGENASE: LDH: 107 U/L (ref 98–192)

## 2017-12-17 LAB — URIC ACID: Uric Acid, Serum: 7.9 mg/dL (ref 3.7–8.6)

## 2017-12-17 MED ORDER — ALLOPURINOL 100 MG PO TABS
100.0000 mg | ORAL_TABLET | Freq: Every day | ORAL | Status: DC
  Administered 2017-12-18 – 2017-12-22 (×5): 100 mg via ORAL
  Filled 2017-12-17 (×5): qty 1

## 2017-12-17 MED ORDER — COQ-10 200 MG PO CAPS: 400.0000 mg | ORAL_CAPSULE | Freq: Every evening | ORAL | Status: DC

## 2017-12-17 MED ORDER — SODIUM CHLORIDE 0.9 % IV BOLUS
1000.0000 mL | Freq: Once | INTRAVENOUS | Status: AC
  Administered 2017-12-17: 1000 mL via INTRAVENOUS

## 2017-12-17 MED ORDER — SODIUM CHLORIDE 0.9 % IV SOLN
INTRAVENOUS | Status: DC
  Administered 2017-12-18 – 2017-12-22 (×10): via INTRAVENOUS

## 2017-12-17 MED ORDER — AMLODIPINE BESYLATE 5 MG PO TABS
5.0000 mg | ORAL_TABLET | Freq: Every day | ORAL | Status: DC
  Administered 2017-12-18 – 2017-12-22 (×5): 5 mg via ORAL
  Filled 2017-12-17 (×5): qty 1

## 2017-12-17 MED ORDER — TAMSULOSIN HCL 0.4 MG PO CAPS
0.4000 mg | ORAL_CAPSULE | Freq: Every day | ORAL | Status: DC
  Administered 2017-12-18 – 2017-12-22 (×5): 0.4 mg via ORAL
  Filled 2017-12-17 (×5): qty 1

## 2017-12-17 MED ORDER — SODIUM POLYSTYRENE SULFONATE 15 GM/60ML PO SUSP
15.0000 g | Freq: Once | ORAL | Status: AC
  Administered 2017-12-17: 15 g via ORAL
  Filled 2017-12-17: qty 60

## 2017-12-17 MED ORDER — CARVEDILOL 12.5 MG PO TABS
12.5000 mg | ORAL_TABLET | Freq: Two times a day (BID) | ORAL | Status: DC
  Administered 2017-12-18 – 2017-12-22 (×9): 12.5 mg via ORAL
  Filled 2017-12-17 (×9): qty 1

## 2017-12-17 MED ORDER — ADULT MULTIVITAMIN W/MINERALS CH
1.0000 | ORAL_TABLET | Freq: Every day | ORAL | Status: DC
  Administered 2017-12-18 – 2017-12-22 (×5): 1 via ORAL
  Filled 2017-12-17 (×5): qty 1

## 2017-12-17 NOTE — Patient Instructions (Signed)
[place] at [facility] Discharge Instructions  Today you saw Dr. Walden Field.    Thank you for choosing [place] at [facility] to provide your oncology and hematology care.  To afford each patient quality time with our provider, please arrive at least 15 minutes before your scheduled appointment time.   If you have a lab appointment with the [facility] please come in thru the  Main Entrance and check in at the main information desk  You need to re-schedule your appointment should you arrive 10 or more minutes late.  We strive to give you quality time with our providers, and arriving late affects you and other patients whose appointments are after yours.  Also, if you no show three or more times for appointments you may be dismissed from the clinic at the providers discretion.     Again, thank you for choosing [facility].  Our hope is that these requests will decrease the amount of time that you wait before being seen by our physicians.       _____________________________________________________________  Should you have questions after your visit to [facility], please contact our office at [phone] between the hours of 8:30 a.m. and 4:30 p.m.  Voicemails left after 4:30 p.m. will not be returned until the following business day.  For prescription refill requests, have your pharmacy contact our office.       Resources For Cancer Patients and their Caregivers ? American Cancer Society: Can assist with transportation, wigs, general needs, runs Look Good Feel Better.        ([phone] 114-4433 ? Cancer Care: Provides financial assistance, online support groups, medication/co-pay assistance.  1-800-813-HOPE 702-733-5749) ? [place] Assists [place] cancer patients and their families through emotional , educational and financial support.  934-772-7946 ? [place] DSS Where to apply for  food stamps, Medicaid and utility assistance. 641-646-2280 ? [employer]: Transportation to medical appointments. 817-626-3106 ? Social Security Administration: May apply for disability if have a Stage IV cancer. 762 173 4612 431-600-7528 ? [employer], Disability and Transit Services: Assists with nutrition, care and transit needs. [place] Support Programs:   > Cancer Support Group  2nd Tuesday of the month 1pm-2pm, Journey Room   > Creative Journey  3rd Tuesday of the month 1130am-1pm, Journey Room

## 2017-12-17 NOTE — Progress Notes (Signed)
CRITICAL VALUE ALERT  Critical Value:  WBC 78.3  Date & Time Notied:  12/17/17 at 1205  Provider Notified: Dr. Walden Field  Orders Received/Actions taken: n/a

## 2017-12-17 NOTE — H&P (Signed)
History and Physical    Andrew [name] CNO:[id number] DOB: [birth year] DOA: 12/17/2017  PCP: [name], MD  Patient coming from: Home  Chief Complaint: Abnormal lab  HPI: Andrew [name] is a 77 y.o. male with medical history significant of hypertension, chronic kidney disease, A. fib recently diagnosed with CLL and started on chemotherapy on July 15 has since then had worsening renal failure.  Patient saw both his oncologist and nephrologist today and labs revealed worsening renal function with a creatinine over 5 so he was sent to the emergency department.  Patient had an outpatient ultrasound of his kidneys scheduled for tomorrow this is been done today instead.  It shows no obstruction.  Patient denies any nausea vomiting diarrhea.  He denies any recent illnesses.  He denies any increase use of NSAIDs over-the-counter.  He denies any fevers.  He reports he has been eating and drinking normally.  He denies any change in medications except for his new chemo.  Patient's creatinine was 3 a month before starting the chemotherapy it is slowly worsened currently 5.3.  Patient denies any urinary symptoms.  Patient referred for admission for acute kidney injury.  DR [place] with nephrology service has been called for consultation also.  Review of Systems: As per HPI otherwise 10 point review of systems negative.   Past Medical History:  Diagnosis Date  . Atrial fibrillation ([place])   . Chronic kidney disease    stage 3  . Chronic lymphocytic leukemia ([place])   . CLL (chronic lymphocytic leukemia) ([place])   . Dysrhythmia    Hx. of Atrial Fibrillation- has resolved  . Hypertension   . Pinched nerve in neck     Past Surgical History:  Procedure Laterality Date  . CATARACT EXTRACTION, BILATERAL    . COLONOSCOPY  07/10/2011   Procedure: COLONOSCOPY;  Surgeon: Dorothyann Peng, MD;  [facility];  Service: Endoscopy;  Laterality: N/A;  10:30 AM  . CRANIOTOMY N/A 10/14/2016   Procedure:  CRANIOTOMY HEMATOMA EVACUATION SUBDURAL;  Surgeon: Jovita Gamma, MD;  Location: [facility];  Service: Neurosurgery;  Laterality: N/A;  CRANIOTOMY HEMATOMA EVACUATION SUBDURAL  . THYROIDECTOMY, PARTIAL    . TONSILLECTOMY       reports that he quit smoking about 46 years ago. He has a 15.00 pack-year smoking history. He has never used smokeless tobacco. He reports that he does not drink alcohol or use drugs.  No Known Allergies  Family History  Problem Relation Age of Onset  . CAD Father 16  . Cancer Mother   . Alzheimer's disease Mother   . Colon cancer Neg Hx     Prior to Admission medications   Medication Sig Start Date End Date Taking? Authorizing Provider  allopurinol (ZYLOPRIM) 100 MG tablet Take 1 tablet (100 mg total) by mouth daily. 11/21/17  Yes Higgs, Mathis Dad, MD  amLODipine (NORVASC) 5 MG tablet TAKE 1 TABLET BY MOUTH ONCE DAILY 11/07/17  Yes Branch, Alphonse Guild, MD  carvedilol (COREG) 12.5 MG tablet Take 12.5 mg by mouth 2 (two) times daily with a meal.   Yes [provider]  chlorambucil (LEUKERAN) 2 MG tablet Take 20 tablets (40mg  total) on Day 1 and Day 15. Give on an empty stomach 1 hour before or 2 hours after meals. 12/13/17  Yes Higgs, Mathis Dad, MD  Coenzyme Q10 (COQ-10) 200 MG CAPS Take 400 mg by mouth every evening.   Yes [provider]  Multiple Vitamin (MULITIVITAMIN WITH MINERALS) TABS Take 1  tablet by mouth daily.   Yes [provider]  tamsulosin (FLOMAX) 0.4 MG CAPS capsule Take 0.4 mg by mouth daily.  07/19/17  Yes [provider]  telmisartan (MICARDIS) 80 MG tablet Take 80 mg by mouth daily.  07/19/17   [provider]    Physical Exam: Vitals:   12/17/17 2130 12/17/17 2145 12/17/17 2200 12/17/17 2215  BP: 135/69 119/77 123/84 132/81  Pulse: 74 76 72 76  Resp:      Temp:      SpO2: 97% 95% 96% 94%  Weight:      Height:          Constitutional: NAD, calm, comfortable Vitals:   12/17/17 2130 12/17/17 2145  12/17/17 2200 12/17/17 2215  BP: 135/69 119/77 123/84 132/81  Pulse: 74 76 72 76  Resp:      Temp:      SpO2: 97% 95% 96% 94%  Weight:      Height:       Eyes: PERRL, lids and conjunctivae normal ENMT: Mucous membranes are moist. Posterior pharynx clear of any exudate or lesions.Normal dentition.  Neck: normal, supple, no masses, no thyromegaly Respiratory: clear to auscultation bilaterally, no wheezing, no crackles. Normal respiratory effort. No accessory muscle use.  Cardiovascular: Regular rate and rhythm, no murmurs / rubs / gallops. No extremity edema. 2+ pedal pulses. No carotid bruits.  Abdomen: no tenderness, no masses palpated. No hepatosplenomegaly. Bowel sounds positive.  Musculoskeletal: no clubbing / cyanosis. No joint deformity upper and lower extremities. Good ROM, no contractures. Normal muscle tone.  Skin: no rashes, lesions, ulcers. No induration Neurologic: CN 2-12 grossly intact. Sensation intact, DTR normal. Strength 5/5 in all 4.  Psychiatric: Normal judgment and insight. Alert and oriented x 3. Normal mood.    Labs on Admission: I have personally reviewed following labs and imaging studies  CBC: Recent Labs  Lab 12/17/17 1144 12/17/17 1727  WBC 78.3* 89.8*  NEUTROABS 3.9 4.5  HGB 11.0* 10.9*  HCT 33.9* 34.9*  MCV 94.7 96.4  PLT 102* 951*   Basic Metabolic Panel: Recent Labs  Lab 12/17/17 1144 12/17/17 1727  NA 138 136  K 4.4 5.8*  CL 107 105  CO2 21* 21*  GLUCOSE 96 87  BUN 67* 66*  CREATININE 5.22* 5.32*  CALCIUM 10.8* 10.9*   GFR: Estimated Creatinine Clearance: 14.8 mL/min (A) (by C-G formula based on SCr of 5.32 mg/dL (H)). Liver Function Tests: Recent Labs  Lab 12/17/17 1144 12/17/17 1727  AST 16 29  ALT 14 15  ALKPHOS 69 72  BILITOT 1.1 1.1  PROT 6.7 6.5  ALBUMIN 4.2 4.2   No results for input(s): LIPASE, AMYLASE in the last 168 hours. No results for input(s): AMMONIA in the last 168 hours. Coagulation Profile: No results  for input(s): INR, PROTIME in the last 168 hours. Cardiac Enzymes: No results for input(s): CKTOTAL, CKMB, CKMBINDEX, TROPONINI in the last 168 hours. BNP (last 3 results) No results for input(s): PROBNP in the last 8760 hours. HbA1C: No results for input(s): HGBA1C in the last 72 hours. CBG: No results for input(s): GLUCAP in the last 168 hours. Lipid Profile: No results for input(s): CHOL, HDL, LDLCALC, TRIG, CHOLHDL, LDLDIRECT in the last 72 hours. Thyroid Function Tests: No results for input(s): TSH, T4TOTAL, FREET4, T3FREE, THYROIDAB in the last 72 hours. Anemia Panel: No results for input(s): VITAMINB12, FOLATE, FERRITIN, TIBC, IRON, RETICCTPCT in the last 72 hours. Urine analysis:    Component Value Date/Time  COLORURINE STRAW (A) 12/17/2017 1725   APPEARANCEUR CLEAR 12/17/2017 1725   LABSPEC 1.010 12/17/2017 1725   PHURINE 6.0 12/17/2017 1725   GLUCOSEU NEGATIVE 12/17/2017 1725   HGBUR MODERATE (A) 12/17/2017 1725   BILIRUBINUR NEGATIVE 12/17/2017 1725   KETONESUR NEGATIVE 12/17/2017 1725   PROTEINUR 30 (A) 12/17/2017 1725   NITRITE NEGATIVE 12/17/2017 1725   LEUKOCYTESUR TRACE (A) 12/17/2017 1725   Sepsis Labs: !!!!!!!!!!!!!!!!!!!!!!!!!!!!!!!!!!!!!!!!!!!! @LABRCNTIP (procalcitonin:4,lacticidven:4) )No results found for this or any previous visit (from the past 240 hour(s)).   Radiological Exams on Admission: US Renal  Result Date: 12/17/2017 CLINICAL DATA:  Elevated creatinine EXAM: RENAL / URINARY TRACT ULTRASOUND COMPLETE COMPARISON:  PET CT 11/15/2017 FINDINGS: Right Kidney: Length: 12.7 cm. Echogenicity within normal limits. No mass or hydronephrosis visualized. Left Kidney: Length: 12.5 cm. Echogenicity within normal limits. No mass or hydronephrosis visualized. Bladder: Appears normal for degree of bladder distention. Incidentally noted are bilateral pelvic masses adjacent to the bladder, measuring 6.5 x 3.3 x 3.2 cm on the right and 4 x 2.1 x 3 cm on the left.  IMPRESSION: 1. Negative for hydronephrosis 2. Bilateral pelvic masses adjacent to the bladder, presumably representing enlarged nodes/lymphadenopathy noted on comparison PET-CT. Electronically Signed   By: Donavan Foil M.D.   On: 12/17/2017 23:08    Old chart reviewed   Case discussed with EDP mcDonald   Assessment/Plan 77 year old male with acute on chronic kidney disease and mild hyperkalemia Principal Problem:   AKI (acute kidney injury) (HCC)-renal ultrasound does not show any evidence of obstruction.  Patient is making urine adequately.  He denies any urinary symptoms.  Unsure if his chemotherapy is worsening his renal failure.  Has been on chlorambucil.  Patient's uric acid level today was 7.9 up from 4.1 last week.  LDH 207.  He is on allopurinol prophylactically for tumor lysis syndrome.  Further recommendation and work-up per nephrology service who have been consulted.  Will bolus 1 L of IV fluids tonight and placed on 100 cc of normal saline overnight and repeat BMP in the morning.  We will also repeat uric acid in the morning.  Active Problems:   CLL (chronic lymphocytic leukemia) (HCC)-noted repeat gas in the morning    A-fib (HCC)-currently rate controlled   Recent intracranial bleed-no anticoagulation  DVT prophylaxis: SCDs Code Status: Full Family Communication: Wife Disposition Plan: Likely tomorrow or 2 days Consults called: Nephrology Admission status: Observation   Navah Grondin A MD [facility]  If 7PM-7AM, please contact night-coverage [email or web address] Password [facility]  12/17/2017, 11:45 PM

## 2017-12-17 NOTE — ED Provider Notes (Signed)
Patient placed in Quick Look pathway, seen and evaluated   Chief Complaint: elevated renal function   HPI:   Pt sent by his MD.  Pt had labs and renal functions have increased  ROS: no fever, no selling no shortness of breath  Physical Exam:   Gen: No distress  Neuro: Awake and Alert  Skin: Warm    Focused Exam: lungs clear, heart rrr   Initiation of care has begun. The patient has been counseled on the process, plan, and necessity for staying for the completion/evaluation, and the remainder of the medical screening examination   Andrew [name] 12/17/17 Adrian, [place], MD 12/19/17 [phone]

## 2017-12-17 NOTE — Progress Notes (Signed)
Diagnosis CLL (chronic lymphocytic leukemia) ([place]) - Plan: CBC with Differential/Platelet, Comprehensive metabolic panel, Lactate dehydrogenase, Uric acid, Calcium, ionized  Hyperuricemia - Plan: CBC with Differential/Platelet, Comprehensive metabolic panel, Lactate dehydrogenase, Uric acid, Calcium, ionized  Staging Cancer Staging CLL (chronic lymphocytic leukemia) (HCC) Staging form: Chronic Lymphocytic Leukemia / Small Lymphocytic Lymphoma, AJCC 8th Edition - Clinical stage from 09/04/2016: Modified Rai Stage IV (Binet: Stage C, Modified Rai risk: High, Lymphocytosis: Present, Adenopathy: Present, Organomegaly: Present, Anemia: Absent, Thrombocytopenia: Present) - Signed by Baird Cancer, PA-C on 09/04/2016   Assessment and Plan:  Historical data obtained from note dated 11/08/2017.  1.   CLL.  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 Pt was previously followed by Dr. Talbert Cage.  CT neck/chest/abd/pelvis on 08/03/16 showed progressive adenopathy. 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 Progressive thrombocytopenia appreciated with platelets <100,000. Treatment initiated with Imbruvica 420 mg daily on 08/19/16.  Imbruvica stopped on 10/11/16 for subdural hematoma possibly secondary to fall in January 2018 vs. Effects of imbruvica.  Pt had previous discussion with Dr. Talbert Cage regarding recommendations due to discontinuation of  imbruvica permanently in light of the subdural hematoma.    Labs done 11/05/2017 showed WBC 99.9 which is increased from 82.6 on labs done in 07/2017.  Plt count was also decreased at 97,000 and was previously 111,000.  He has stable lymphocyte count at 94%.   Thrombocytopenia had worsened and is now less than 100,000.  WBC has also increased.  Pt remains asymptomatic.    PET scan for restaging evaluation of adenopathy which was chosen due to worsening renal function.   Pet scan done 11/15/2017 showed  IMPRESSION: Diffuse hypermetabolic lymphadenopathy throughout the  neck, chest, abdomen, and pelvis, without significant change in size compared to prior CT on 08/03/2016.  No new or progressive lymphadenopathy identified.  I discussed options of therapy with  bendamustin + rituxan or chlorambucil + obinutuzumab. Patient would prefer an oral regimen.    He was provided written information about Chlorambucil and obinutuzumab. He does not desire to take obinutuzumab due to it being an infusional agent.  Chlorambucil will be dosed at 0.5 mg/kg D1 and D15.    Hep B and C and HIV was checked prior to therapy initiation and all testing was negative.  Based on weight and reduction due to RI dose should be 40 mg D1 and D15.    Pt has begun Chlorambucil and reports he tolerated therapy.  Labs done today 12/17/2017 reviewed and  showed WBC 78.3 HB 11 plts 102,000.  He will have repeat labs in 3-4 weeks.  WBC has decreased from labs done 12/10/2017 when WBC was 93,000.   He will RTC in 3-4 weeks for evaluation during C2.  He is advised to notify the office if he has any problems prior to his next visit.   2.  CKD.  Labs done today show Cr 5.  He is referred back to nephrology.  Uric acid is 7.9.  Pt should continue allopurinol 100 mg po daily.  He was treated with rasburicase to decrease risk for TLS.  Continue hydration.    3.  TLS prophylaxis.  UA 7.9 on labs done 12/17/2017.  Pt should continue allopurinol 100 mg po daily.  He has been treated with Rasburicase.    Will repeat labs in 3-4 weeks pending nephrology evaluation.    4.  Subdural hematoma s/p craniotomy/Headaches: Follow-up with Dr. Sherwood Gambler, neurosurgery.  I have discussed with him if IV access recommended he  would not be able to have Heparin flushes.  Pt does not desire IV placement.    5.  HTN.  BP is 118/69.  Follow-up with PCP.     Current Status:  Pt is seen today for follow-up.  He reports he tolerated first cycle of chlorambucil.  He denies any nausea, vomiting, abdominal pain, fevers. Wife feels  adenopathy is improving.       CLL (chronic lymphocytic leukemia) ([place])   03/09/2015 Initial Diagnosis    CLL (chronic lymphocytic leukemia) ([place])      08/03/2016 Imaging    CT neck- 1. Diffuse and bulky lymphadenopathy in keeping with history of CLL. Nodal enlargement has diffusely progressed since April 2016. 2. Chest and abdomen CT reported separately.      08/03/2016 Imaging    CT CAP- 1. Bulky lymphadenopathy in the chest, abdomen, and pelvis as described. 2. Splenomegaly. 3. Coronary artery and thoracoabdominal aortic atherosclerosis. 4. Prostatomegaly. 5. Several tiny lucent lesions in the bony anatomic pelvis, nonspecific, but attention on follow-up imaging recommended.      08/10/2016 Procedure    Bone marrow aspiration and biopsy by IR.      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 PERIPHERAL BLOOD: - CHRONIC LYMPHOCYTIC LEUKEMIA. - THROMBOCYTOPENIA.      08/11/2016 Pathology Results    Bone Marrow Flow Cytometry - CHRONIC LYMPHOCYTIC LEUKEMIA/SMALL LYMPHOCYTIC LYMPHOMA.      08/16/2016 Pathology Results    Normal cytogenetics.  Molecular cytogenetic analysis by [facility] demonstrates a 13q-      08/19/2016 -  Chemotherapy    Imbruvica 420 mg daily         Problem List Patient Active Problem List   Diagnosis Date Noted  . Subdural hematoma ([place]) [S06.5X9A] 10/12/2016  . A-fib ([place]) [I48.91] 10/12/2016  . Hypoxemia [R09.02] 05/13/2016  . Preventative health care [Z00.00] 03/06/2016  . CLL (chronic lymphocytic leukemia) ([place]) [C91.90] 03/09/2015    Past Medical History Past Medical History:  Diagnosis Date  . Atrial fibrillation ([place])   . Chronic kidney disease    stage 3  . Chronic lymphocytic leukemia ([place])   . CLL (chronic lymphocytic leukemia) ([place])   . Dysrhythmia    Hx. of Atrial Fibrillation- has resolved  .  Hypertension   . Pinched nerve in neck     Past Surgical History Past Surgical History:  Procedure Laterality Date  . CATARACT EXTRACTION, BILATERAL    . COLONOSCOPY  07/10/2011   Procedure: COLONOSCOPY;  Surgeon: Dorothyann Peng, MD;  [facility];  Service: Endoscopy;  Laterality: N/A;  10:30 AM  . CRANIOTOMY N/A 10/14/2016   Procedure: CRANIOTOMY HEMATOMA EVACUATION SUBDURAL;  Surgeon: Jovita Gamma, MD;  Location: [facility];  Service: Neurosurgery;  Laterality: N/A;  CRANIOTOMY HEMATOMA EVACUATION SUBDURAL  . THYROIDECTOMY, PARTIAL    . TONSILLECTOMY      Family History Family History  Problem Relation Age of Onset  . CAD Father 82  . Cancer Mother   . Alzheimer's disease Mother   . Colon cancer Neg Hx      Social History  reports that he quit smoking about 46 years ago. He has a 15.00 pack-year smoking history. He has never used smokeless tobacco. He reports that he does not drink alcohol or use drugs.  Medications  Current Outpatient Medications:  .  allopurinol (ZYLOPRIM) 100 MG tablet, Take 1 tablet (100 mg total) by  mouth daily., Disp: 60 tablet, Rfl: 0 .  amLODipine (NORVASC) 5 MG tablet, TAKE 1 TABLET BY MOUTH ONCE DAILY, Disp: 90 tablet, Rfl: 3 .  carvedilol (COREG) 12.5 MG tablet, Take 12.5 mg by mouth 2 (two) times daily with a meal., Disp: , Rfl:  .  chlorambucil (LEUKERAN) 2 MG tablet, Take 20 tablets (56m total) on Day 1 and Day 15. Give on an empty stomach 1 hour before or 2 hours after meals., Disp: 40 tablet, Rfl: 0 .  Coenzyme Q10 (COQ10) 100 MG CAPS, Take 100 mg by mouth daily. , Disp: , Rfl:  .  Multiple Vitamin (MULITIVITAMIN WITH MINERALS) TABS, Take 1 tablet by mouth daily., Disp: , Rfl:  .  tamsulosin (FLOMAX) 0.4 MG CAPS capsule, Take 1 capsule by mouth daily., Disp: , Rfl:  .  telmisartan (MICARDIS) 80 MG tablet, Take 1 tablet by mouth daily., Disp: , Rfl:   Allergies Patient has no known allergies.  Review of Systems Review of Systems  - Oncology ROS negative   Physical Exam  Vitals Wt Readings from Last 3 Encounters:  12/17/17 223 lb 12.8 oz (101.5 kg)  12/06/17 226 lb (102.5 kg)  11/19/17 230 lb 4.8 oz (104.5 kg)   Temp Readings from Last 3 Encounters:  12/17/17 98.3 F (36.8 C) (Oral)  12/06/17 97.6 F (36.4 C) (Oral)  11/19/17 98.1 F (36.7 C) (Oral)   BP Readings from Last 3 Encounters:  12/17/17 118/69  12/06/17 111/68  11/19/17 117/79   Pulse Readings from Last 3 Encounters:  12/17/17 73  12/06/17 68  11/19/17 78   Constitutional: Well-developed, well-nourished, and in no distress.   HENT: Head: Normocephalic and atraumatic.  Mouth/Throat: No oropharyngeal exudate. Mucosa moist. Eyes: Pupils are equal, round, and reactive to light. Conjunctivae are normal. No scleral icterus.  Neck: Normal range of motion. Neck supple. No JVD present.  Cardiovascular: Normal rate, regular rhythm and normal heart sounds.  Exam reveals no gallop and no friction rub.   No murmur heard. Pulmonary/Chest: Effort normal and breath sounds normal. No respiratory distress. No wheezes.No rales.  Abdominal: Soft. Bowel sounds are normal. No distension. There is no tenderness. There is no guarding.  Musculoskeletal: No edema or tenderness.  Lymphadenopathy: Cervical adenopathy improving.  Neurological: Alert and oriented to person, place, and time. No cranial nerve deficit.  Skin: Skin is warm and dry. No rash noted. No erythema. No pallor.  Psychiatric: Affect and judgment normal.   Labs Lab on 12/17/2017  Component Date Value Ref Range Status  . WBC 12/17/2017 78.3* 4.0 - 10.5 K/uL Final   Comment: REPEATED TO VERIFY CRITICAL RESULT CALLED TO, READ BACK BY AND VERIFIED WITH: ANDERSON,A'@1205'  BY MATTHEWS,B 7.22.19 WHITE COUNT CONFIRMED ON SMEAR   . RBC 12/17/2017 3.58* 4.22 - 5.81 MIL/uL Final  . Hemoglobin 12/17/2017 11.0* 13.0 - 17.0 g/dL Final  . HCT 12/17/2017 33.9* 39.0 - 52.0 % Final  . MCV 12/17/2017 94.7   78.0 - 100.0 fL Final  . MCH 12/17/2017 30.7  26.0 - 34.0 pg Final  . MCHC 12/17/2017 32.4  30.0 - 36.0 g/dL Final  . RDW 12/17/2017 13.7  11.5 - 15.5 % Final  . Platelets 12/17/2017 102* 150 - 400 K/uL Final   Comment: SPECIMEN CHECKED FOR CLOTS PLATELET COUNT CONFIRMED BY SMEAR   . Neutrophils Relative % 12/17/2017 5  % Final  . Lymphocytes Relative 12/17/2017 94  % Final  . Monocytes Relative 12/17/2017 1  % Final  . Eosinophils Relative  12/17/2017 0  % Final  . Basophils Relative 12/17/2017 0  % Final  . Neutro Abs 12/17/2017 3.9  1.7 - 7.7 K/uL Final  . Lymphs Abs 12/17/2017 73.6* 0.7 - 4.0 K/uL Final  . Monocytes Absolute 12/17/2017 0.8  0.1 - 1.0 K/uL Final  . Eosinophils Absolute 12/17/2017 0.0  0.0 - 0.7 K/uL Final  . Basophils Absolute 12/17/2017 0.0  0.0 - 0.1 K/uL Final  . WBC Morphology 12/17/2017 ABSOLUTE LYMPHOCYTOSIS   Final   Comment: ATYPICAL LYMPHOCYTES SMUDGE CELLS Performed at [facility], [address], [place], [place] [address]   . Sodium 12/17/2017 138  135 - 145 mmol/L Final  . Potassium 12/17/2017 4.4  3.5 - 5.1 mmol/L Final  . Chloride 12/17/2017 107  98 - 111 mmol/L Final  . CO2 12/17/2017 21* 22 - 32 mmol/L Final  . Glucose, Bld 12/17/2017 96  70 - 99 mg/dL Final  . BUN 12/17/2017 67* 8 - 23 mg/dL Final  . Creatinine, Ser 12/17/2017 5.22* 0.61 - 1.24 mg/dL Final  . Calcium 12/17/2017 10.8* 8.9 - 10.3 mg/dL Final  . Total Protein 12/17/2017 6.7  6.5 - 8.1 g/dL Final  . Albumin 12/17/2017 4.2  3.5 - 5.0 g/dL Final  . AST 12/17/2017 16  15 - 41 U/L Final  . ALT 12/17/2017 14  0 - 44 U/L Final  . Alkaline Phosphatase 12/17/2017 69  38 - 126 U/L Final  . Total Bilirubin 12/17/2017 1.1  0.3 - 1.2 mg/dL Final  . GFR calc non Af Amer 12/17/2017 10* >60 mL/min Final  . GFR calc Af Amer 12/17/2017 11* >60 mL/min Final   Comment: (NOTE) The eGFR has been calculated using the CKD EPI equation. This calculation has not been validated in all clinical  situations. eGFR's persistently <60 mL/min signify possible Chronic Kidney Disease.   Georgiann Hahn gap 12/17/2017 10  5 - 15 Final   Performed at [facility], [address] [place], [place] [address]  . LDH 12/17/2017 107  98 - 192 U/L Final   Performed at [facility], [address], [place], [place] [address]  . Uric Acid, Serum 12/17/2017 7.9  3.7 - 8.6 mg/dL Final   Performed at [facility], [address] [place], [place] [address]     Pathology Orders Placed This Encounter  Procedures  . CBC with Differential/Platelet    Standing Status:   Future    Standing Expiration Date:   12/18/2018  . Comprehensive metabolic panel    Standing Status:   Future    Standing Expiration Date:   12/18/2018  . Lactate dehydrogenase    Standing Status:   Future    Standing Expiration Date:   12/18/2018  . Uric acid    Standing Status:   Future    Standing Expiration Date:   12/18/2018  . Calcium, ionized    Standing Status:   Future    Standing Expiration Date:   12/18/2018       Zoila Shutter MD

## 2017-12-17 NOTE — ED Notes (Signed)
ED Provider at bedside. 

## 2017-12-17 NOTE — Telephone Encounter (Signed)
Dr. Walden Field reviewed labs results, Called patient to  Given him results. Advise pt his creatinine was up 5.22 and we put in referral for kidney doctor. Pt was currently at the kidney doctors office, he would advise him of his results. Also Dr Walden Field wanted to confirm that pt knew to take 15 pills ( 30 mg) for next dose. He confirmed his took 20 pills last and would take 15 pill next dose.

## 2017-12-17 NOTE — ED Provider Notes (Signed)
[facility] EMERGENCY DEPARTMENT Provider Note   CSN: [id number] Arrival date & time: 12/17/17  1708     History   Chief Complaint Chief Complaint  Patient presents with  . Abnormal Lab    HPI Andrew [name] is a 77 y.o. male with a h/o CLL, chronic kidney disease stage III, PAF not on anticoagulation, subdural hematoma, and HTN who presents to the emergency department from his nephrologist's office with a chief complaint of abnormal labs.  The patient reports that he had labs drawn at his oncologist office this morning.  He received the results while he was at his appointment with his nephrologist who advised the patient to come to the emergency department for further work-up and evaluation. Creatinine was noted to be 5.22.  He states that his nephrologist had originally scheduled him to have a renal ultrasound performed outpatient tomorrow, but after receiving the lab results at his appointment he advised him to come to the emergency department so we could keep a closer eye on him."  He denies fever, chills, abdominal pain, nausea, vomiting, diarrhea, constipation, hematuria, back pain, rash, weakness, numbness, chest pain, or dyspnea.  He has begun Chlorambucil. He received his first cycle on 12/10/17.  His second dose is scheduled for 12/25/17. No recent NSAID use.   Oncologist Dr. Walden Field Nephrologist: Dr. Marval Regal  The history is provided by the patient. No language interpreter was used.  Abnormal Lab    Past Medical History:  Diagnosis Date  . Atrial fibrillation ([place])   . Chronic kidney disease    stage 3  . Chronic lymphocytic leukemia ([place])   . CLL (chronic lymphocytic leukemia) ([place])   . Dysrhythmia    Hx. of Atrial Fibrillation- has resolved  . Hypertension   . Pinched nerve in neck     Patient Active Problem List   Diagnosis Date Noted  . AKI (acute kidney injury) ([place]) 12/17/2017  . Hypertension   . Subdural hematoma ([place]) 10/12/2016  .  A-fib ([place]) 10/12/2016  . Hypoxemia 05/13/2016  . Preventative health care 03/06/2016  . CLL (chronic lymphocytic leukemia) ([place]) 03/09/2015    Past Surgical History:  Procedure Laterality Date  . CATARACT EXTRACTION, BILATERAL    . COLONOSCOPY  07/10/2011   Procedure: COLONOSCOPY;  Surgeon: Dorothyann Peng, MD;  Location[facility];  Service: Endoscopy;  Laterality: N/A;  10:30 AM  . CRANIOTOMY N/A 10/14/2016   Procedure: CRANIOTOMY HEMATOMA EVACUATION SUBDURAL;  Surgeon: Jovita Gamma, MD;  Location: [facility];  Service: Neurosurgery;  Laterality: N/A;  CRANIOTOMY HEMATOMA EVACUATION SUBDURAL  . THYROIDECTOMY, PARTIAL    . TONSILLECTOMY          Home Medications    Prior to Admission medications   Medication Sig Start Date End Date Taking? Authorizing Provider  allopurinol (ZYLOPRIM) 100 MG tablet Take 1 tablet (100 mg total) by mouth daily. 11/21/17  Yes Higgs, Mathis Dad, MD  amLODipine (NORVASC) 5 MG tablet TAKE 1 TABLET BY MOUTH ONCE DAILY 11/07/17  Yes Branch, Alphonse Guild, MD  carvedilol (COREG) 12.5 MG tablet Take 12.5 mg by mouth 2 (two) times daily with a meal.   Yes [provider]  chlorambucil (LEUKERAN) 2 MG tablet Take 20 tablets (40mg  total) on Day 1 and Day 15. Give on an empty stomach 1 hour before or 2 hours after meals. 12/13/17  Yes Higgs, Mathis Dad, MD  Coenzyme Q10 (COQ-10) 200 MG CAPS Take 400 mg by mouth every evening.   Yes [provider]  Multiple Vitamin (MULITIVITAMIN WITH MINERALS) TABS Take 1 tablet by mouth daily.   Yes [provider]  tamsulosin (FLOMAX) 0.4 MG CAPS capsule Take 0.4 mg by mouth daily.  07/19/17  Yes [provider]  telmisartan (MICARDIS) 80 MG tablet Take 80 mg by mouth daily.  07/19/17   [provider]    Family History Family History  Problem Relation Age of Onset  . CAD Father 106  . Cancer Mother   . Alzheimer's disease Mother   . Colon cancer Neg Hx     Social History Social History    Tobacco Use  . Smoking status: Former Smoker    Packs/day: 1.00    Years: 15.00    Pack years: 15.00    Last attempt to quit: 01/26/1971    Years since quitting: 46.9  . Smokeless tobacco: Never Used  Substance Use Topics  . Alcohol use: No  . Drug use: No     Allergies   Patient has no known allergies.   Review of Systems Review of Systems  Constitutional: Negative for appetite change, chills and fever.  HENT: Negative for congestion.   Respiratory: Negative for cough and shortness of breath.   Cardiovascular: Negative for chest pain, palpitations and leg swelling.  Gastrointestinal: Negative for abdominal pain, diarrhea, nausea and vomiting.  Genitourinary: Negative for dysuria.  Musculoskeletal: Negative for arthralgias, back pain, gait problem and myalgias.  Skin: Negative for rash.  Allergic/Immunologic: Negative for immunocompromised state.  Neurological: Negative for dizziness, weakness, numbness and headaches.  Hematological: Positive for adenopathy.  Psychiatric/Behavioral: Negative for confusion.     Physical Exam Updated Vital Signs BP 133/87   Pulse 77   Temp 98.4 F (36.9 C)   Resp 16   Ht 6\' 2"  (1.88 m)   Wt 102.1 kg (225 lb)   SpO2 97%   BMI 28.89 kg/m   Physical Exam  Constitutional: He appears well-developed. No distress.  Well appearing. NAD.   HENT:  Head: Normocephalic.  Eyes: Conjunctivae are normal.  Neck: Neck supple.  Cardiovascular: Normal rate, regular rhythm, normal heart sounds and intact distal pulses. Exam reveals no gallop and no friction rub.  No murmur heard. Pulmonary/Chest: Effort normal and breath sounds normal. No stridor. No respiratory distress. He has no wheezes. He has no rales. He exhibits no tenderness.  Abdominal: Soft. He exhibits no distension and no mass. There is no tenderness. There is no rebound and no guarding. No hernia.  Musculoskeletal: Normal range of motion. He exhibits no edema, tenderness or  deformity.  Lymphadenopathy:    He has cervical adenopathy.  Neurological: He is alert.  Skin: Skin is warm and dry. He is not diaphoretic.  Psychiatric: His behavior is normal.  Nursing note and vitals reviewed.    ED Treatments / Results  Labs (all labs ordered are listed, but only abnormal results are displayed) Labs Reviewed  CBC WITH DIFFERENTIAL/PLATELET - Abnormal; Notable for the following components:      Result Value   WBC 89.8 (*)    RBC 3.62 (*)    Hemoglobin 10.9 (*)    HCT 34.9 (*)    Platelets 112 (*)    Lymphs Abs 81.7 (*)    Monocytes Absolute 2.7 (*)    Basophils Absolute 0.9 (*)    All other components within normal limits  COMPREHENSIVE METABOLIC PANEL - Abnormal; Notable for the following components:   Potassium 5.8 (*)    CO2 21 (*)  BUN 66 (*)    Creatinine, Ser 5.32 (*)    Calcium 10.9 (*)    GFR calc non Af Amer 9 (*)    GFR calc Af Amer 11 (*)    All other components within normal limits  URINALYSIS, ROUTINE W REFLEX MICROSCOPIC - Abnormal; Notable for the following components:   Color, Urine STRAW (*)    Hgb urine dipstick MODERATE (*)    Protein, ur 30 (*)    Leukocytes, UA TRACE (*)    All other components within normal limits  BASIC METABOLIC PANEL  CBC  URIC ACID    EKG  EKG Interpretation  Date/Time: 12/17/17 23:26    Ventricular Rate: 72   PR Interval:   N/A QRS Duration: 118  QT Interval:   394 QTC Calculation:  432 R Axis:    98 Text Interpretation: Atrial fibrillation          Radiology US Renal  Result Date: 12/17/2017 CLINICAL DATA:  Elevated creatinine EXAM: RENAL / URINARY TRACT ULTRASOUND COMPLETE COMPARISON:  PET CT 11/15/2017 FINDINGS: Right Kidney: Length: 12.7 cm. Echogenicity within normal limits. No mass or hydronephrosis visualized. Left Kidney: Length: 12.5 cm. Echogenicity within normal limits. No mass or hydronephrosis visualized. Bladder: Appears normal for degree of bladder distention. Incidentally  noted are bilateral pelvic masses adjacent to the bladder, measuring 6.5 x 3.3 x 3.2 cm on the right and 4 x 2.1 x 3 cm on the left. IMPRESSION: 1. Negative for hydronephrosis 2. Bilateral pelvic masses adjacent to the bladder, presumably representing enlarged nodes/lymphadenopathy noted on comparison PET-CT. Electronically Signed   By: Donavan Foil M.D.   On: 12/17/2017 23:08    Procedures Procedures (including critical care time)  Medications Ordered in ED Medications  multivitamin with minerals tablet 1 tablet (has no administration in time range)  carvedilol (COREG) tablet 12.5 mg (has no administration in time range)  tamsulosin (FLOMAX) capsule 0.4 mg (has no administration in time range)  amLODipine (NORVASC) tablet 5 mg (has no administration in time range)  allopurinol (ZYLOPRIM) tablet 100 mg (has no administration in time range)  0.9 %  sodium chloride infusion ( Intravenous New Bag/Given 12/18/17 0119)  sodium polystyrene (KAYEXALATE) 15 GM/60ML suspension 15 g (15 g Oral Given 12/17/17 2323)  sodium chloride 0.9 % bolus 1,000 mL (0 mLs Intravenous Stopped 12/18/17 0108)     Initial Impression / Assessment and Plan / ED Course  I have reviewed the triage vital signs and the nursing notes.  Pertinent labs & imaging results that were available during my care of the patient were reviewed by me and considered in my medical decision making (see chart for details).  Clinical Course as of Dec 18 152  Mon Dec 17, 2017  2241 Potassium(!): 5.8 [PM]  2309 ALT: 15 [PM]    Clinical Course User Index [PM] Valarie Merino, MD    77 year old male with a h/o CLL, chronic kidney disease stage III, PAF not on anticoagulation, and HTN who presents to the emergency department from his nephrologist office with a chief complaint of elevated creatinine.  The patient's baseline creatinine appears about 1.4-1.5.  Creatinine was 1.60 when checked 3/18. Since June 10, the patient's creatinine was  gone from 3.10->3.92->4.76- to5.22->5.32 today.  The patient was seen and evaluated along with Dr. Francia Greaves, attending physician.   BUN/CR 12:1. Doubt prerenal azotremia. Renal US with bilateral pelvic masses adjacent to the bladder, presumably enlarged lymph nodes.  Negative for hydronephrosis. UA with moderate  hemoglobinuria and mild proteinuria. No casts noted on UA. Consider 24-hour urine for nephritic versus nephrotic syndrome.  K 5.8 tonight. Kayexalate given.  Spoke with Dr. Jimmy Footman to let him know the patient was going to be admitted. Dr. Shanon Brow, hospitalist, will admit for further work-up and evaluation of AKI in the setting of CLL.  The patient appears reasonably stabilized for admission considering the current resources, flow, and capabilities available in the ED at this time, and I doubt any other [facility] requiring further screening and/or treatment in the ED prior to admission.   Final Clinical Impressions(s) / ED Diagnoses   Final diagnoses:  AKI (acute kidney injury) [facility])    ED Discharge Orders    None       Joanne Gavel, PA-C 12/18/17 0154    Valarie Merino, MD 12/19/17 [phone]

## 2017-12-17 NOTE — ED Notes (Signed)
Patient transported to Ultrasound 

## 2017-12-17 NOTE — ED Triage Notes (Signed)
Pt from doctors office for abnormal labs of BUN of 54 and Creatinine of 5.2. Pt denies any sx or any pain. Hx of leukemia which pt is taking medications and chemo for.

## 2017-12-18 ENCOUNTER — Other Ambulatory Visit: Payer: Self-pay

## 2017-12-18 ENCOUNTER — Ambulatory Visit (HOSPITAL_COMMUNITY): Admission: RE | Admit: 2017-12-18 | Payer: Medicare HMO | Source: Ambulatory Visit

## 2017-12-18 ENCOUNTER — Encounter (HOSPITAL_COMMUNITY): Payer: Self-pay | Admitting: Physician Assistant

## 2017-12-18 DIAGNOSIS — I480 Paroxysmal atrial fibrillation: Secondary | ICD-10-CM

## 2017-12-18 DIAGNOSIS — I10 Essential (primary) hypertension: Secondary | ICD-10-CM

## 2017-12-18 DIAGNOSIS — D631 Anemia in chronic kidney disease: Secondary | ICD-10-CM | POA: Diagnosis not present

## 2017-12-18 DIAGNOSIS — I129 Hypertensive chronic kidney disease with stage 1 through stage 4 chronic kidney disease, or unspecified chronic kidney disease: Secondary | ICD-10-CM | POA: Diagnosis not present

## 2017-12-18 DIAGNOSIS — N179 Acute kidney failure, unspecified: Secondary | ICD-10-CM | POA: Diagnosis not present

## 2017-12-18 DIAGNOSIS — N189 Chronic kidney disease, unspecified: Secondary | ICD-10-CM | POA: Diagnosis not present

## 2017-12-18 DIAGNOSIS — N183 Chronic kidney disease, stage 3 (moderate): Secondary | ICD-10-CM | POA: Diagnosis not present

## 2017-12-18 DIAGNOSIS — C9190 Lymphoid leukemia, unspecified not having achieved remission: Secondary | ICD-10-CM | POA: Diagnosis not present

## 2017-12-18 LAB — CBC
HCT: 31.2 % — ABNORMAL LOW (ref 39.0–52.0)
HEMOGLOBIN: 9.7 g/dL — AB (ref 13.0–17.0)
MCH: 29.8 pg (ref 26.0–34.0)
MCHC: 31.1 g/dL (ref 30.0–36.0)
MCV: 95.7 fL (ref 78.0–100.0)
Platelets: 97 10*3/uL — ABNORMAL LOW (ref 150–400)
RBC: 3.26 MIL/uL — AB (ref 4.22–5.81)
RDW: 13.4 % (ref 11.5–15.5)
WBC: 81.9 10*3/uL (ref 4.0–10.5)

## 2017-12-18 LAB — BASIC METABOLIC PANEL
ANION GAP: 9 (ref 5–15)
BUN: 65 mg/dL — ABNORMAL HIGH (ref 8–23)
CALCIUM: 9.8 mg/dL (ref 8.9–10.3)
CO2: 20 mmol/L — ABNORMAL LOW (ref 22–32)
Chloride: 109 mmol/L (ref 98–111)
Creatinine, Ser: 4.93 mg/dL — ABNORMAL HIGH (ref 0.61–1.24)
GFR, EST AFRICAN AMERICAN: 12 mL/min — AB (ref >60)
GFR, EST NON AFRICAN AMERICAN: 10 mL/min — AB (ref >60)
Glucose, Bld: 94 mg/dL (ref 70–99)
Potassium: 4.2 mmol/L (ref 3.5–5.1)
Sodium: 138 mmol/L (ref 135–145)

## 2017-12-18 LAB — PHOSPHORUS: PHOSPHORUS: 5.1 mg/dL — AB (ref 2.5–4.6)

## 2017-12-18 LAB — MRSA PCR SCREENING: MRSA BY PCR: POSITIVE — AB

## 2017-12-18 LAB — URIC ACID: URIC ACID, SERUM: 8 mg/dL (ref 3.7–8.6)

## 2017-12-18 LAB — CALCIUM, IONIZED: Calcium, Ionized, Serum: 5.9 mg/dL — ABNORMAL HIGH (ref 4.5–5.6)

## 2017-12-18 MED ORDER — CHLORHEXIDINE GLUCONATE CLOTH 2 % EX PADS
6.0000 | MEDICATED_PAD | Freq: Every day | CUTANEOUS | Status: DC
  Administered 2017-12-19 – 2017-12-22 (×4): 6 via TOPICAL

## 2017-12-18 MED ORDER — BACITRACIN-NEOMYCIN-POLYMYXIN OINTMENT TUBE
1.0000 "application " | TOPICAL_OINTMENT | CUTANEOUS | Status: DC | PRN
  Administered 2017-12-18 – 2017-12-19 (×2): 1 via TOPICAL
  Filled 2017-12-18: qty 14

## 2017-12-18 MED ORDER — MUPIROCIN 2 % EX OINT
1.0000 "application " | TOPICAL_OINTMENT | Freq: Two times a day (BID) | CUTANEOUS | Status: DC
  Administered 2017-12-18 – 2017-12-22 (×9): 1 via NASAL
  Filled 2017-12-18 (×3): qty 22

## 2017-12-18 NOTE — Consult Note (Addendum)
[facility] Renal Consultation Note  Requesting MD:  Indication for Consultation:   HPI: Andrew [name] is a 77 y.o. male w/ a PMHx notable for HTN, rapidly progressively worsening CKD, A-fib, and CLL started on chemotherapy on July 15th with worsening renal function. He was seen by his oncologist and nephrologist on the day of admission when his renal function was noted to have increased from 4.76 to 5.22-32 prompting him to be sent to the ED for evaluation. US kidneys failed to demonstrate an obstructive pattern. Aside from initiating chemotherapy he has not altered his medication regimen or increased/initiated NSAID use. He was admitted for and acute on chronic renal failure.   The patient denied feeling confused at times, headaches, visual changes, fatigue, weakness, chest pain, abdominal pain, nausea, vomiting, diarrhea, constipation, dysuria, hematuria, myalgias, pruritus, urinary volume or frequency changes or fever.  Creatinine, Ser  Date/Time Value Ref Range Status  12/18/2017 05:10 AM 4.93 (H) 0.61 - 1.24 mg/dL Final  12/17/2017 05:27 PM 5.32 (H) 0.61 - 1.24 mg/dL Final  12/17/2017 11:44 AM 5.22 (H) 0.61 - 1.24 mg/dL Final  12/10/2017 10:11 AM 4.76 (H) 0.61 - 1.24 mg/dL Final  12/03/2017 09:19 AM 3.92 (H) 0.61 - 1.24 mg/dL Final  11/05/2017 09:45 AM 3.10 (H) 0.61 - 1.24 mg/dL Final  08/13/2017 02:18 PM 1.60 (H) 0.61 - 1.24 mg/dL Final  08/06/2017 08:58 AM 1.56 (H) 0.61 - 1.24 mg/dL Final  07/28/2017 09:27 AM 1.67 (H) 0.61 - 1.24 mg/dL Final  07/27/2017 12:15 AM 2.03 (H) 0.61 - 1.24 mg/dL Final  06/21/2017 08:28 AM 1.83 (H) 0.61 - 1.24 mg/dL Final  03/22/2017 08:46 AM 1.40 (H) 0.61 - 1.24 mg/dL Final  12/18/2016 09:00 AM 1.26 (H) 0.61 - 1.24 mg/dL Final  11/16/2016 09:40 AM 1.17 0.61 - 1.24 mg/dL Final  10/16/2016 02:44 AM 0.90 0.61 - 1.24 mg/dL Final  10/15/2016 02:43 AM 1.03 0.61 - 1.24 mg/dL Final  10/14/2016 03:00 AM 1.02 0.61 - 1.24 mg/dL Final  10/13/2016  04:36 AM 1.08 0.61 - 1.24 mg/dL Final  10/11/2016 11:26 PM 1.15 0.61 - 1.24 mg/dL Final  10/05/2016 08:21 AM 1.26 (H) 0.61 - 1.24 mg/dL Final  09/04/2016 08:47 AM 1.26 (H) 0.61 - 1.24 mg/dL Final  07/31/2016 02:35 PM 1.45 (H) 0.61 - 1.24 mg/dL Final  06/01/2016 08:11 AM 1.30 (H) 0.61 - 1.24 mg/dL Final  05/13/2016 12:12 PM 1.39 (H) 0.61 - 1.24 mg/dL Final  05/01/2016 02:05 PM 1.44 (H) 0.61 - 1.24 mg/dL Final  04/03/2016 08:19 AM 1.41 (H) 0.61 - 1.24 mg/dL Final  03/06/2016 12:29 PM 1.39 (H) 0.61 - 1.24 mg/dL Final  11/04/2015 12:58 PM 1.30 (H) 0.61 - 1.24 mg/dL Final  06/10/2015 09:15 AM 1.46 (H) 0.61 - 1.24 mg/dL Final  03/09/2015 10:58 AM 1.19 0.61 - 1.24 mg/dL Final  12/07/2014 11:52 AM 1.40 (H) 0.61 - 1.24 mg/dL Final  09/07/2014 03:40 PM 1.10 0.50 - 1.35 mg/dL Final     PMHx:   Past Medical History:  Diagnosis Date  . Atrial fibrillation ([place])   . Chronic kidney disease    stage 3  . Chronic lymphocytic leukemia ([place])   . CLL (chronic lymphocytic leukemia) ([place])   . Dysrhythmia    Hx. of Atrial Fibrillation- has resolved  . Hypertension   . Pinched nerve in neck     Past Surgical History:  Procedure Laterality Date  . CATARACT EXTRACTION, BILATERAL    . COLONOSCOPY  07/10/2011   Procedure: COLONOSCOPY;  Surgeon: Hope Pigeon  Fields, MD;  [facility];  Service: Endoscopy;  Laterality: N/A;  10:30 AM  . CRANIOTOMY N/A 10/14/2016   Procedure: CRANIOTOMY HEMATOMA EVACUATION SUBDURAL;  Surgeon: Jovita Gamma, MD;  Location: [facility];  Service: Neurosurgery;  Laterality: N/A;  CRANIOTOMY HEMATOMA EVACUATION SUBDURAL  . THYROIDECTOMY, PARTIAL    . TONSILLECTOMY      Family Hx:  Family History  Problem Relation Age of Onset  . CAD Father 24  . Cancer Mother   . Alzheimer's disease Mother   . Colon cancer Neg Hx     Social History:  reports that he quit smoking about 46 years ago. He has a 15.00 pack-year smoking history. He has never used smokeless tobacco. He  reports that he does not drink alcohol or use drugs.  Allergies: No Known Allergies  Medications: Prior to Admission medications   Medication Sig Start Date End Date Taking? Authorizing Provider  allopurinol (ZYLOPRIM) 100 MG tablet Take 1 tablet (100 mg total) by mouth daily. 11/21/17  Yes Higgs, Mathis Dad, MD  amLODipine (NORVASC) 5 MG tablet TAKE 1 TABLET BY MOUTH ONCE DAILY 11/07/17  Yes Branch, Alphonse Guild, MD  carvedilol (COREG) 12.5 MG tablet Take 12.5 mg by mouth 2 (two) times daily with a meal.   Yes [provider]  chlorambucil (LEUKERAN) 2 MG tablet Take 20 tablets (65m total) on Day 1 and Day 15. Give on an empty stomach 1 hour before or 2 hours after meals. 12/13/17  Yes Higgs, VMathis Dad MD  Coenzyme Q10 (COQ-10) 200 MG CAPS Take 400 mg by mouth every evening.   Yes [provider]  Multiple Vitamin (MULITIVITAMIN WITH MINERALS) TABS Take 1 tablet by mouth daily.   Yes [provider]  tamsulosin (FLOMAX) 0.4 MG CAPS capsule Take 0.4 mg by mouth daily.  07/19/17  Yes [provider]  telmisartan (MICARDIS) 80 MG tablet Take 80 mg by mouth daily.  07/19/17   [provider]    I have reviewed the patient's current medications.  Labs:  Results for orders placed or performed during the hospital encounter of 12/17/17 (from the past 48 hour(s))  Urinalysis, Routine w reflex microscopic     Status: Abnormal   Collection Time: 12/17/17  5:25 PM  Result Value Ref Range   Color, Urine STRAW (A) YELLOW   APPearance CLEAR CLEAR   Specific Gravity, Urine 1.010 1.005 - 1.030   pH 6.0 5.0 - 8.0   Glucose, UA NEGATIVE NEGATIVE mg/dL   Hgb urine dipstick MODERATE (A) NEGATIVE   Bilirubin Urine NEGATIVE NEGATIVE   Ketones, ur NEGATIVE NEGATIVE mg/dL   Protein, ur 30 (A) NEGATIVE mg/dL   Nitrite NEGATIVE NEGATIVE   Leukocytes, UA TRACE (A) NEGATIVE   RBC / HPF 6-10 0 - 5 RBC/hpf   WBC, UA 6-10 0 - 5 WBC/hpf   Bacteria, UA NONE SEEN NONE SEEN     Comment: Performed at [facility] [address], [place] [id number] CBC with Differential     Status: Abnormal   Collection Time: 12/17/17  5:27 PM  Result Value Ref Range   WBC 89.8 (HH) 4.0 - 10.5 K/uL    Comment: REPEATED TO VERIFY CRITICAL RESULT CALLED TO, READ BACK BY AND VERIFIED WITH: E CLINE,RN 1755 12/17/17 D BRADLEY    RBC 3.62 (L) 4.22 - 5.81 MIL/uL   Hemoglobin 10.9 (L) 13.0 - 17.0 g/dL   HCT 34.9 (L) 39.0 - 52.0 %   MCV 96.4  78.0 - 100.0 fL   MCH 30.1 26.0 - 34.0 pg   MCHC 31.2 30.0 - 36.0 g/dL   RDW 13.4 11.5 - 15.5 %   Platelets 112 (L) 150 - 400 K/uL    Comment: SPECIMEN CHECKED FOR CLOTS REPEATED TO VERIFY PLATELET COUNT CONFIRMED BY SMEAR    Neutrophils Relative % 5 %   Lymphocytes Relative 91 %   Monocytes Relative 3 %   Eosinophils Relative 0 %   Basophils Relative 1 %   Neutro Abs 4.5 1.7 - 7.7 K/uL   Lymphs Abs 81.7 (H) 0.7 - 4.0 K/uL   Monocytes Absolute 2.7 (H) 0.1 - 1.0 K/uL   Eosinophils Absolute 0.0 0.0 - 0.7 K/uL   Basophils Absolute 0.9 (H) 0.0 - 0.1 K/uL   WBC Morphology SMUDGE CELLS    Smear Review PENDING PATHOLOGIST REVIEW     Comment: Performed at [facility], [address], [place], [place] [address]  Comprehensive metabolic panel     Status: Abnormal   Collection Time: 12/17/17  5:27 PM  Result Value Ref Range   Sodium 136 135 - 145 mmol/L   Potassium 5.8 (H) 3.5 - 5.1 mmol/L   Chloride 105 98 - 111 mmol/L   CO2 21 (L) 22 - 32 mmol/L   Glucose, Bld 87 70 - 99 mg/dL   BUN 66 (H) 8 - 23 mg/dL   Creatinine, Ser 5.32 (H) 0.61 - 1.24 mg/dL   Calcium 10.9 (H) 8.9 - 10.3 mg/dL   Total Protein 6.5 6.5 - 8.1 g/dL   Albumin 4.2 3.5 - 5.0 g/dL   AST 29 15 - 41 U/L   ALT 15 0 - 44 U/L   Alkaline Phosphatase 72 38 - 126 U/L   Total Bilirubin 1.1 0.3 - 1.2 mg/dL   GFR calc non Af Amer 9 (L) >60 mL/min   GFR calc Af Amer 11 (L) >60 mL/min    Comment: (NOTE) The eGFR has been calculated using the CKD EPI  equation. This calculation has not been validated in all clinical situations. eGFR's persistently <60 mL/min signify possible Chronic Kidney Disease.    Anion gap 10 5 - 15    Comment: Performed at [facility] [address] [place], [place] [address]  Basic metabolic panel     Status: Abnormal   Collection Time: 12/18/17  5:10 AM  Result Value Ref Range   Sodium 138 135 - 145 mmol/L   Potassium 4.2 3.5 - 5.1 mmol/L    Comment: DELTA CHECK NOTED   Chloride 109 98 - 111 mmol/L   CO2 20 (L) 22 - 32 mmol/L   Glucose, Bld 94 70 - 99 mg/dL   BUN 65 (H) 8 - 23 mg/dL   Creatinine, Ser 4.93 (H) 0.61 - 1.24 mg/dL   Calcium 9.8 8.9 - 10.3 mg/dL   GFR calc non Af Amer 10 (L) >60 mL/min   GFR calc Af Amer 12 (L) >60 mL/min    Comment: (NOTE) The eGFR has been calculated using the CKD EPI equation. This calculation has not been validated in all clinical situations. eGFR's persistently <60 mL/min signify possible Chronic Kidney Disease.    Anion gap 9 5 - 15    Comment: Performed at [facility] [address] [place], [place] [address]  CBC     Status: Abnormal   Collection Time: 12/18/17  5:10 AM  Result Value Ref Range   WBC 81.9 (HH) 4.0 -  10.5 K/uL    Comment: REPEATED TO VERIFY CRITICAL VALUE NOTED.  VALUE IS CONSISTENT WITH PREVIOUSLY REPORTED AND CALLED VALUE.    RBC 3.26 (L) 4.22 - 5.81 MIL/uL   Hemoglobin 9.7 (L) 13.0 - 17.0 g/dL   HCT 31.2 (L) 39.0 - 52.0 %   MCV 95.7 78.0 - 100.0 fL   MCH 29.8 26.0 - 34.0 pg   MCHC 31.1 30.0 - 36.0 g/dL   RDW 13.4 11.5 - 15.5 %   Platelets 97 (L) 150 - 400 K/uL    Comment: CONSISTENT WITH PREVIOUS RESULT Performed at [facility] [address], [place], [place] [address]   Uric acid     Status: None   Collection Time: 12/18/17  5:10 AM  Result Value Ref Range   Uric Acid, Serum 8.0 3.7 - 8.6 mg/dL    Comment: Performed at [facility] [address], [place], [place] [address]  Phosphorus     Status:  Abnormal   Collection Time: 12/18/17  5:10 AM  Result Value Ref Range   Phosphorus 5.1 (H) 2.5 - 4.6 mg/dL    Comment: Performed at [facility] [address], [place], [place] [address]  MRSA PCR Screening     Status: Abnormal   Collection Time: 12/18/17  5:40 AM  Result Value Ref Range   MRSA by PCR POSITIVE (A) NEGATIVE    Comment:        The GeneXpert MRSA Assay (FDA approved for NASAL specimens only), is one component of a comprehensive MRSA colonization surveillance program. It is not intended to diagnose MRSA infection nor to guide or monitor treatment for MRSA infections. RESULT CALLED TO, READ BACK BY AND VERIFIED WITH: Lorre Munroe RN 12/18/17 0709 JDW Performed at [facility], [address], [place], [place] [address]      ROS:  Pertinent items are noted in HPI.  Physical Exam: Vitals:   12/18/17 0500 12/18/17 0850  BP: 131/85 129/82  Pulse: 76 79  Resp:  16  Temp:  98 F (36.7 C)  SpO2: 95% 98%     Physical Exam  Constitutional: He is oriented to person, place, and time. He appears well-nourished. No distress.  HENT:  Head: Normocephalic and atraumatic.  Eyes: Conjunctivae and EOM are normal.  Neck: Normal range of motion.  Cardiovascular: Normal rate and regular rhythm.  No murmur heard. Pulmonary/Chest: Effort normal and breath sounds normal. No respiratory distress. He has no wheezes.  Abdominal: Soft. Bowel sounds are normal. He exhibits no distension.  Musculoskeletal: He exhibits no edema or tenderness.  Neurological: He is alert and oriented to person, place, and time.  Skin: Skin is warm. Capillary refill takes less than 2 seconds. He is not diaphoretic.  Psychiatric: He has a normal mood and affect.  Vitals reviewed.  Assessment/Plan: 1.Renal- Acute renal injury on CKD - Patient with increased Cr fluctuating around 1.6 (1.17-2.03) from 11/16/2016-08/13/2017. Patient underwent CT angio w/ contrast (ISOVUE) on 03/02 (Cr on 03/18 1.6)  and PET Scan (FDG) on 11/15/2017. Neither exam correlates well the timing in the increased Cr which was noted to be 3.10 on 11/05/2017 (prior to the PET and after a baseline on 03/18) and subsequently continued to increase thereafter. There were no notable medication changes prior to the Cr increases as per chart review in our system. The patient has been compliant with his MICARDIS (telmisartan-HCTC) for several years. In addition he typically works outdoors and routinely attests  to decreased urinary output while at work, particularly of recent given the heat. He attempts to remain hydrated but has not been able to continue urinating at the same frequency as when he is at home compared to when he is at work. The combination may explain the acute increase but does not necessarily explain the more gradual increase in Cr.  I do not feel the chronic change is related to tumor lysis syndrome. Patients UA demonstrated Moderate Hgb, and positive protein which in conjunction with enlarged bilaterally kidneys could explain the AKI as although hematologic cancers commonly infiltrate the renal parenchyma CKD is less common but possible.  There are no emergent or subacute indications to initiate dialysis at this stage and given his improvement with fluids I feel monitoring him with gentle hydration is appropriate.  -Agree with fluids  -Repeat BMP in am -Hold ARB-HCTZ -Kayexalate PRN for Hyperkalemia  -Monitor renal function, avoid nephrotoxic agents -Ordered 24 hour urine 2. Hypertension/volume - BP stable. Agree with Amlodipine 1m, agree with holding ARB and any nephrotoxic agents. Agree with gentle rehydration.  -Daily BMP's recommended  3. CLL - As per primary team 4. Anemia  - Normocytic, Hgb stable, most likely 2/2 his CLL. Would monitor for now.   History of CLL and also hypertension on ARB   Was admitted with acute on chronic renal failure with no identifiable etiology of his chronic renal failure. He has  had the ARB held and hydrated  No hydronephrosis   This appears perplexing and will probably need a renal biopsy   Discussed with resident and proceed with renal biopsy  LKathi Ludwig7/23/2019, 10:07 AM

## 2017-12-18 NOTE — Consult Note (Addendum)
Chief Complaint: Patient was seen in consultation today for image guided random renal biopsy.  Referring Physician(s): Estill Cotta, MD  Supervising Physician: Jacqulynn Cadet  Patient Status: [facility] - In-pt  History of Present Illness: Andrew [name] is a 77 y.o. male with a past medical history significant for HTN, CKD, A.fib and recent CLL diagnosis for which he started chemotherapy on 7/15. He presented to [facility] from his nephrologist's office due to creatinine of 5.22 with previous creatinine 4.7 on 7/15 and 3.9 on 7/8. He was admitted for further workup - random renal biopsy requested by Dr. Tana Coast.  Renal US 7/22 IMPRESSION: 1. Negative for hydronephrosis 2. Bilateral pelvic masses adjacent to the bladder, presumably representing enlarged nodes/lymphadenopathy noted on comparison PET-CT.  He denies any complaints today - he states he feels fine overall, just a little tired. He denies any hematuria, dysuria or flank pain.  Past Medical History:  Diagnosis Date  . Atrial fibrillation ([place])   . Chronic kidney disease    stage 3  . Chronic lymphocytic leukemia ([place])   . CLL (chronic lymphocytic leukemia) ([place])   . Dysrhythmia    Hx. of Atrial Fibrillation- has resolved  . Hypertension   . Pinched nerve in neck     Past Surgical History:  Procedure Laterality Date  . CATARACT EXTRACTION, BILATERAL    . COLONOSCOPY  07/10/2011   Procedure: COLONOSCOPY;  Surgeon: Dorothyann Peng, MD;  [facility];  Service: Endoscopy;  Laterality: N/A;  10:30 AM  . CRANIOTOMY N/A 10/14/2016   Procedure: CRANIOTOMY HEMATOMA EVACUATION SUBDURAL;  Surgeon: Jovita Gamma, MD;  Location: [facility];  Service: Neurosurgery;  Laterality: N/A;  CRANIOTOMY HEMATOMA EVACUATION SUBDURAL  . THYROIDECTOMY, PARTIAL    . TONSILLECTOMY      Allergies: Patient has no known allergies.  Medications: Prior to Admission medications   Medication Sig Start Date End Date Taking? Authorizing  Provider  allopurinol (ZYLOPRIM) 100 MG tablet Take 1 tablet (100 mg total) by mouth daily. 11/21/17  Yes Higgs, Mathis Dad, MD  amLODipine (NORVASC) 5 MG tablet TAKE 1 TABLET BY MOUTH ONCE DAILY 11/07/17  Yes Branch, Alphonse Guild, MD  carvedilol (COREG) 12.5 MG tablet Take 12.5 mg by mouth 2 (two) times daily with a meal.   Yes [provider]  chlorambucil (LEUKERAN) 2 MG tablet Take 20 tablets (40mg  total) on Day 1 and Day 15. Give on an empty stomach 1 hour before or 2 hours after meals. 12/13/17  Yes Higgs, Mathis Dad, MD  Coenzyme Q10 (COQ-10) 200 MG CAPS Take 400 mg by mouth every evening.   Yes [provider]  Multiple Vitamin (MULITIVITAMIN WITH MINERALS) TABS Take 1 tablet by mouth daily.   Yes [provider]  tamsulosin (FLOMAX) 0.4 MG CAPS capsule Take 0.4 mg by mouth daily.  07/19/17  Yes [provider]  telmisartan (MICARDIS) 80 MG tablet Take 80 mg by mouth daily.  07/19/17   [provider]     Family History  Problem Relation Age of Onset  . CAD Father 19  . Cancer Mother   . Alzheimer's disease Mother   . Colon cancer Neg Hx     Social History   Socioeconomic History  . Marital status: Married    Spouse name: Not on file  . Number of children: Not on file  . Years of education: Not on file  . Highest education level: Not on file  Occupational History  . Not on file  Social  Needs  . Financial resource strain: Not on file  . Food insecurity:    Worry: Not on file    Inability: Not on file  . Transportation needs:    Medical: Not on file    Non-medical: Not on file  Tobacco Use  . Smoking status: Former Smoker    Packs/day: 1.00    Years: 15.00    Pack years: 15.00    Last attempt to quit: 01/26/1971    Years since quitting: 46.9  . Smokeless tobacco: Never Used  Substance and Sexual Activity  . Alcohol use: No  . Drug use: No  . Sexual activity: Not on file  Lifestyle  . Physical activity:    Days per week: Not on file      Minutes per session: Not on file  . Stress: Not on file  Relationships  . Social connections:    Talks on phone: Not on file    Gets together: Not on file    Attends religious service: Not on file    Active member of club or organization: Not on file    Attends meetings of clubs or organizations: Not on file    Relationship status: Not on file  Other Topics Concern  . Not on file  Social History Narrative  . Not on file    Review of Systems: A 12 point ROS discussed and pertinent positives are indicated in the HPI above.  All other systems are negative.  Review of Systems  Constitutional: Negative for activity change, appetite change and fever.  Respiratory: Negative for shortness of breath.   Cardiovascular: Negative for chest pain and leg swelling.  Gastrointestinal: Negative for abdominal distention, abdominal pain, nausea and vomiting.  Genitourinary: Negative for dysuria, flank pain and hematuria.  Skin: Negative for rash.  Neurological: Negative for weakness.  Psychiatric/Behavioral: Negative for confusion.    Vital Signs: BP 129/82 (BP Location: Left Arm)   Pulse 79   Temp 98 F (36.7 C) (Oral)   Resp 16   Ht 6\' 3"  (1.905 m)   Wt 225 lb 1.4 oz (102.1 kg)   SpO2 98%   BMI 28.13 kg/m   Physical Exam  Constitutional: He is oriented to person, place, and time. No distress.  Patient laying in bed comfortably with several visitors in the room  HENT:  Head: Normocephalic.  Cardiovascular: Normal rate, regular rhythm and normal heart sounds.  No murmur heard. Pulmonary/Chest: Effort normal and breath sounds normal. He has no wheezes. He has no rales.  Abdominal: Soft. Bowel sounds are normal. He exhibits no distension. There is no tenderness.  Neurological: He is alert and oriented to person, place, and time.  Skin: Skin is warm and dry. No rash noted. He is not diaphoretic.  Psychiatric: He has a normal mood and affect. His behavior is normal. Judgment and  thought content normal.  Nursing note and vitals reviewed.   MD Evaluation Airway: WNL Heart: WNL Abdomen: WNL Chest/ Lungs: WNL ASA  Classification: 2 Mallampati/Airway Score: One  Imaging: US Renal  Result Date: 12/17/2017 CLINICAL DATA:  Elevated creatinine EXAM: RENAL / URINARY TRACT ULTRASOUND COMPLETE COMPARISON:  PET CT 11/15/2017 FINDINGS: Right Kidney: Length: 12.7 cm. Echogenicity within normal limits. No mass or hydronephrosis visualized. Left Kidney: Length: 12.5 cm. Echogenicity within normal limits. No mass or hydronephrosis visualized. Bladder: Appears normal for degree of bladder distention. Incidentally noted are bilateral pelvic masses adjacent to the bladder, measuring 6.5 x 3.3 x 3.2 cm on the  right and 4 x 2.1 x 3 cm on the left. IMPRESSION: 1. Negative for hydronephrosis 2. Bilateral pelvic masses adjacent to the bladder, presumably representing enlarged nodes/lymphadenopathy noted on comparison PET-CT. Electronically Signed   By: Donavan Foil M.D.   On: 12/17/2017 23:08    Labs:  CBC: Recent Labs    12/10/17 1011 12/17/17 1144 12/17/17 1727 12/18/17 0510  WBC 93.8* 78.3* 89.8* 81.9*  HGB 11.2* 11.0* 10.9* 9.7*  HCT 33.8* 33.9* 34.9* 31.2*  PLT 93* 102* 112* 97*    COAGS: No results for input(s): INR, APTT in the last 8760 hours.  BMP: Recent Labs    12/10/17 1011 12/17/17 1144 12/17/17 1727 12/18/17 0510  NA 140 138 136 138  K 4.3 4.4 5.8* 4.2  CL 108 107 105 109  CO2 24 21* 21* 20*  GLUCOSE 95 96 87 94  BUN 54* 67* 66* 65*  CALCIUM 10.6* 10.8* 10.9* 9.8  CREATININE 4.76* 5.22* 5.32* 4.93*  GFRNONAA 11* 10* 9* 10*  GFRAA 12* 11* 11* 12*    LIVER FUNCTION TESTS: Recent Labs    11/05/17 0945 12/03/17 0919 12/10/17 1011 12/17/17 1144 12/17/17 1727  BILITOT 1.0  --  1.1 1.1 1.1  AST 18  --  18 16 29   ALT 13*  --  12 14 15   ALKPHOS 64  --  73 69 72  PROT 6.6  --  6.6 6.7 6.5  ALBUMIN 4.3 4.3 4.1 4.2 4.2    TUMOR MARKERS: No  results for input(s): AFPTM, CEA, CA199, CHROMGRNA in the last 8760 hours.  Assessment and Plan:  Random renal biopsy due to acute worsening of chronic kidney disease - recently started on chemotherapy 7/15 for CLL with noted worsening kidney function since then. Most recent creatinine 4.93. Random renal biopsy requested by hospitalist for further workup. [facility] pathology report to be completed per hospitalist/nephrology MD.  Risks and benefits discussed with the patient and wife including, but not limited to bleeding, infection, damage to adjacent structures or low yield requiring additional tests.  All of the patient and wife's questions were answered, patient is agreeable to proceed. Consent signed and in chart.  Thank you for this interesting consult.  I greatly enjoyed meeting UNNAMED ZEIEN and look forward to participating in their care.  A copy of this report was sent to the requesting provider on this date.  Electronically Signed: Joaquim Nam, PA-C 12/18/2017, 3:21 PM   I spent a total of 40 Minutes in face to face in clinical consultation, greater than 50% of which was counseling/coordinating care for image guided random renal biopsy.

## 2017-12-18 NOTE — Progress Notes (Signed)
[facility]                                                                              Patient Demographics  Andrew [name], is a 77 y.o. male, DOB - [birth year], MWN:[id number]  Admit date - 12/17/2017   Admitting Physician Phillips Grout, MD  Outpatient Primary MD for the patient is [name], MD  Outpatient specialists:   LOS - 0  days   Medical records reviewed and are as summarized below:    Chief Complaint  Patient presents with  . Abnormal Lab       Brief summary   Patient is a 77 year old male with hypertension, CKD, atrial fibrillation, diagnosed with CLL, started chemotherapy on July 15 and since then having worsening renal failure.  Patient saw both his oncologist and nephrologist and creatinine was noted to be over 5 and was sent to ED.  Patient otherwise denied any nausea, vomiting or diarrhea, recent illness or increased use of NSAIDs.  Patient's creatinine was 3.9 on 12/03/2017 before the initiation of chemotherapy and 4.7 on the day of chemo was started on 7/15.  Patient reports he receives chemotherapy every 15 days, last chemo on 7/15.   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 Harl Bowie)  History of subdural hematoma -Status post craniotomy with evacuation on 10/14/2016, followed by Dr. Sherwood Gambler  Hypertension Continue Coreg, amlodipine, BP stable.  Chronic anemia of malignancy/CLL Hemoglobin 9.7, on admission at baseline 11.0 Likely some component of hemodilution, follow closely, on chemotherapy  Code Status: Full CODE STATUS DVT Prophylaxis: SCDs Family Communication: Discussed in detail with the patient, all imaging results, lab results explained to the patient   Disposition Plan: Once creatinine closer to baseline  Time Spent in minutes   25 minutes  Procedures:  None  Consultants:   Nephrology  Antimicrobials:      Medications  Scheduled Meds: . allopurinol  100 mg Oral Daily  . amLODipine  5 mg Oral Daily  . carvedilol  12.5 mg Oral BID WC  . multivitamin with minerals  1 tablet Oral Daily  . tamsulosin  0.4 mg Oral Daily   Continuous Infusions: . sodium chloride 100 mL/hr at 12/18/17 1225   PRN Meds:.neomycin-bacitracin-polymyxin   Antibiotics   Anti-infectives (From admission, onward)   None        Subjective:   Andrew [name]  was seen and examined today.  Feels better today, no acute complaints, no chest pain no shortness of breath. Patient denies dizziness,  abdominal pain, N/V/D/C, new weakness, numbess, tingling. No acute events overnight.    Objective:   Vitals:   12/18/17 0447 12/18/17 0454 12/18/17 0500 12/18/17 0850  BP: 126/78 (!) 133/94 131/85 129/82  Pulse: 67 83 76 79  Resp: 14   16  Temp: 98.5 F (36.9 C)   98 F (36.7 C)  TempSrc: Oral   Oral  SpO2: 97% 95% 95% 98%  Weight:      Height:        Intake/Output Summary (Last 24 hours) at 12/18/2017 1314 Last data filed at 12/18/2017 0931 Gross per 24 hour  Intake 928.47 ml  Output 0 ml  Net 928.47 ml     Wt Readings from Last 3 Encounters:  12/18/17 102.1 kg (225  lb 1.4 oz)  12/17/17 101.5 kg (223 lb 12.8 oz)  12/06/17 102.5 kg (226 lb)     Exam  General: Alert and oriented x 3, NAD  Eyes:   HEENT:    Cardiovascular: S1 S2 auscultated, no rubs, murmurs or gallops. Regular rate and rhythm.  Respiratory: Clear to auscultation bilaterally, no wheezing, rales or rhonchi  Gastrointestinal: Soft, nontender, nondistended, + bowel sounds  Ext: no pedal edema bilaterally  Neuro: no new deficits  Musculoskeletal: No digital cyanosis, clubbing  Skin: No rashes  Psych: Normal affect and demeanor, alert and oriented x3    Data Reviewed:  I have personally reviewed following labs and imaging studies  Micro Results Recent Results (from the past 240 hour(s))  MRSA PCR Screening     Status: Abnormal   Collection Time: 12/18/17  5:40 AM  Result Value Ref Range Status   MRSA by PCR POSITIVE (A) NEGATIVE Final    Comment:        The GeneXpert MRSA Assay (FDA approved for NASAL specimens only), is one component of a comprehensive MRSA colonization surveillance program. It is not intended to diagnose MRSA infection nor to guide or monitor treatment for MRSA infections. RESULT CALLED TO, READ BACK BY AND VERIFIED WITH: Lorre Munroe RN 12/18/17 0709 JDW Performed at [facility], [address] [place], [place] [address]     Radiology Reports US Renal  Result Date: 12/17/2017 CLINICAL DATA:  Elevated creatinine EXAM: RENAL / URINARY TRACT ULTRASOUND COMPLETE COMPARISON:  PET CT 11/15/2017 FINDINGS: Right Kidney: Length: 12.7 cm. Echogenicity within normal limits. No mass or hydronephrosis visualized. Left Kidney: Length: 12.5 cm. Echogenicity within normal limits. No mass or hydronephrosis visualized. Bladder: Appears normal for degree of bladder distention. Incidentally noted are bilateral pelvic masses adjacent to the bladder, measuring 6.5 x 3.3 x 3.2 cm on the right and 4 x 2.1 x 3 cm on the left. IMPRESSION: 1. Negative for  hydronephrosis 2. Bilateral pelvic masses adjacent to the bladder, presumably representing enlarged nodes/lymphadenopathy noted on comparison PET-CT. Electronically Signed   By: Donavan Foil M.D.   On: 12/17/2017 23:08    Lab Data:  CBC: Recent Labs  Lab 12/17/17 1144 12/17/17 1727 12/18/17 0510  WBC 78.3* 89.8* 81.9*  NEUTROABS 3.9 4.5  --   HGB 11.0* 10.9* 9.7*  HCT 33.9* 34.9* 31.2*  MCV 94.7 96.4 95.7  PLT 102* 112* 97*   Basic Metabolic Panel: Recent Labs  Lab 12/17/17 1144 12/17/17 1727 12/18/17 0510  NA 138 136 138  K 4.4 5.8* 4.2  CL 107 105  109  CO2 21* 21* 20*  GLUCOSE 96 87 94  BUN 67* 66* 65*  CREATININE 5.22* 5.32* 4.93*  CALCIUM 10.8* 10.9* 9.8  PHOS  --   --  5.1*   GFR: Estimated Creatinine Clearance: 16.2 mL/min (A) (by C-G formula based on SCr of 4.93 mg/dL (H)). Liver Function Tests: Recent Labs  Lab 12/17/17 1144 12/17/17 1727  AST 16 29  ALT 14 15  ALKPHOS 69 72  BILITOT 1.1 1.1  PROT 6.7 6.5  ALBUMIN 4.2 4.2   No results for input(s): LIPASE, AMYLASE in the last 168 hours. No results for input(s): AMMONIA in the last 168 hours. Coagulation Profile: No results for input(s): INR, PROTIME in the last 168 hours. Cardiac Enzymes: No results for input(s): CKTOTAL, CKMB, CKMBINDEX, TROPONINI in the last 168 hours. BNP (last 3 results) No results for input(s): PROBNP in the last 8760 hours. HbA1C: No results for input(s): HGBA1C in the last 72 hours. CBG: No results for input(s): GLUCAP in the last 168 hours. Lipid Profile: No results for input(s): CHOL, HDL, LDLCALC, TRIG, CHOLHDL, LDLDIRECT in the last 72 hours. Thyroid Function Tests: No results for input(s): TSH, T4TOTAL, FREET4, T3FREE, THYROIDAB in the last 72 hours. Anemia Panel: No results for input(s): VITAMINB12, FOLATE, FERRITIN, TIBC, IRON, RETICCTPCT in the last 72 hours. Urine analysis:    Component Value Date/Time   COLORURINE STRAW (A) 12/17/2017 1725   APPEARANCEUR  CLEAR 12/17/2017 1725   LABSPEC 1.010 12/17/2017 1725   PHURINE 6.0 12/17/2017 1725   GLUCOSEU NEGATIVE 12/17/2017 1725   HGBUR MODERATE (A) 12/17/2017 1725   BILIRUBINUR NEGATIVE 12/17/2017 1725   KETONESUR NEGATIVE 12/17/2017 1725   PROTEINUR 30 (A) 12/17/2017 1725   NITRITE NEGATIVE 12/17/2017 1725   LEUKOCYTESUR TRACE (A) 12/17/2017 1725     Ripudeep Rai M.D. [facility] 12/18/2017, 1:14 PM  Pager: [phone] Between 7am to 7pm - call Pager - [phone]  After 7pm go to [email or web address] - password [email or web address]  Call night coverage person covering after 7pm

## 2017-12-18 NOTE — Plan of Care (Signed)
VSS, pt remains free of injury.

## 2017-12-19 ENCOUNTER — Observation Stay (HOSPITAL_COMMUNITY): Payer: Medicare HMO

## 2017-12-19 DIAGNOSIS — L03115 Cellulitis of right lower limb: Secondary | ICD-10-CM | POA: Diagnosis not present

## 2017-12-19 DIAGNOSIS — Z79899 Other long term (current) drug therapy: Secondary | ICD-10-CM | POA: Diagnosis not present

## 2017-12-19 DIAGNOSIS — D239 Other benign neoplasm of skin, unspecified: Secondary | ICD-10-CM | POA: Diagnosis present

## 2017-12-19 DIAGNOSIS — I445 Left posterior fascicular block: Secondary | ICD-10-CM | POA: Diagnosis present

## 2017-12-19 DIAGNOSIS — C9190 Lymphoid leukemia, unspecified not having achieved remission: Secondary | ICD-10-CM | POA: Diagnosis not present

## 2017-12-19 DIAGNOSIS — Z5329 Procedure and treatment not carried out because of patient's decision for other reasons: Secondary | ICD-10-CM | POA: Diagnosis present

## 2017-12-19 DIAGNOSIS — N179 Acute kidney failure, unspecified: Secondary | ICD-10-CM | POA: Diagnosis not present

## 2017-12-19 DIAGNOSIS — N183 Chronic kidney disease, stage 3 (moderate): Secondary | ICD-10-CM | POA: Diagnosis not present

## 2017-12-19 DIAGNOSIS — D630 Anemia in neoplastic disease: Secondary | ICD-10-CM | POA: Diagnosis present

## 2017-12-19 DIAGNOSIS — T451X5A Adverse effect of antineoplastic and immunosuppressive drugs, initial encounter: Secondary | ICD-10-CM | POA: Diagnosis present

## 2017-12-19 DIAGNOSIS — L7682 Other postprocedural complications of skin and subcutaneous tissue: Secondary | ICD-10-CM | POA: Diagnosis present

## 2017-12-19 DIAGNOSIS — I129 Hypertensive chronic kidney disease with stage 1 through stage 4 chronic kidney disease, or unspecified chronic kidney disease: Secondary | ICD-10-CM | POA: Diagnosis not present

## 2017-12-19 DIAGNOSIS — D696 Thrombocytopenia, unspecified: Secondary | ICD-10-CM | POA: Diagnosis not present

## 2017-12-19 DIAGNOSIS — Y848 Other medical procedures as the cause of abnormal reaction of the patient, or of later complication, without mention of misadventure at the time of the procedure: Secondary | ICD-10-CM | POA: Diagnosis not present

## 2017-12-19 DIAGNOSIS — D631 Anemia in chronic kidney disease: Secondary | ICD-10-CM | POA: Diagnosis not present

## 2017-12-19 DIAGNOSIS — N12 Tubulo-interstitial nephritis, not specified as acute or chronic: Secondary | ICD-10-CM | POA: Diagnosis not present

## 2017-12-19 DIAGNOSIS — Z87891 Personal history of nicotine dependence: Secondary | ICD-10-CM | POA: Diagnosis not present

## 2017-12-19 DIAGNOSIS — E860 Dehydration: Secondary | ICD-10-CM | POA: Diagnosis not present

## 2017-12-19 DIAGNOSIS — N189 Chronic kidney disease, unspecified: Secondary | ICD-10-CM

## 2017-12-19 DIAGNOSIS — Z8679 Personal history of other diseases of the circulatory system: Secondary | ICD-10-CM | POA: Diagnosis not present

## 2017-12-19 DIAGNOSIS — T465X5A Adverse effect of other antihypertensive drugs, initial encounter: Secondary | ICD-10-CM | POA: Diagnosis present

## 2017-12-19 DIAGNOSIS — I878 Other specified disorders of veins: Secondary | ICD-10-CM | POA: Diagnosis present

## 2017-12-19 DIAGNOSIS — C9110 Chronic lymphocytic leukemia of B-cell type not having achieved remission: Secondary | ICD-10-CM | POA: Diagnosis not present

## 2017-12-19 DIAGNOSIS — E875 Hyperkalemia: Secondary | ICD-10-CM | POA: Diagnosis not present

## 2017-12-19 DIAGNOSIS — C7901 Secondary malignant neoplasm of right kidney and renal pelvis: Secondary | ICD-10-CM | POA: Diagnosis not present

## 2017-12-19 DIAGNOSIS — I480 Paroxysmal atrial fibrillation: Secondary | ICD-10-CM | POA: Diagnosis not present

## 2017-12-19 DIAGNOSIS — E883 Tumor lysis syndrome: Secondary | ICD-10-CM | POA: Diagnosis not present

## 2017-12-19 DIAGNOSIS — Z809 Family history of malignant neoplasm, unspecified: Secondary | ICD-10-CM | POA: Diagnosis not present

## 2017-12-19 DIAGNOSIS — I10 Essential (primary) hypertension: Secondary | ICD-10-CM | POA: Diagnosis not present

## 2017-12-19 LAB — CBC
HCT: 29.9 % — ABNORMAL LOW (ref 39.0–52.0)
HEMOGLOBIN: 9.4 g/dL — AB (ref 13.0–17.0)
MCH: 30 pg (ref 26.0–34.0)
MCHC: 31.4 g/dL (ref 30.0–36.0)
MCV: 95.5 fL (ref 78.0–100.0)
Platelets: 87 10*3/uL — ABNORMAL LOW (ref 150–400)
RBC: 3.13 MIL/uL — ABNORMAL LOW (ref 4.22–5.81)
RDW: 13.4 % (ref 11.5–15.5)
WBC: 71.1 10*3/uL — AB (ref 4.0–10.5)

## 2017-12-19 LAB — BASIC METABOLIC PANEL
Anion gap: 8 (ref 5–15)
BUN: 64 mg/dL — AB (ref 8–23)
CHLORIDE: 111 mmol/L (ref 98–111)
CO2: 20 mmol/L — ABNORMAL LOW (ref 22–32)
Calcium: 9.6 mg/dL (ref 8.9–10.3)
Creatinine, Ser: 4.82 mg/dL — ABNORMAL HIGH (ref 0.61–1.24)
GFR calc Af Amer: 12 mL/min — ABNORMAL LOW (ref >60)
GFR, EST NON AFRICAN AMERICAN: 11 mL/min — AB (ref >60)
Glucose, Bld: 112 mg/dL — ABNORMAL HIGH (ref 70–99)
Potassium: 4.4 mmol/L (ref 3.5–5.1)
SODIUM: 139 mmol/L (ref 135–145)

## 2017-12-19 LAB — PROTEIN, URINE, 24 HOUR
COLLECTION INTERVAL-UPROT: 24 h
PROTEIN, 24H URINE: 960 mg/d — AB (ref 50–100)
Protein, Urine: 32 mg/dL
Urine Total Volume-UPROT: 3000 mL

## 2017-12-19 LAB — PATHOLOGIST SMEAR REVIEW

## 2017-12-19 LAB — PROTIME-INR
INR: 1.2
Prothrombin Time: 15.1 seconds (ref 11.4–15.2)

## 2017-12-19 MED ORDER — FENTANYL CITRATE (PF) 100 MCG/2ML IJ SOLN
INTRAMUSCULAR | Status: AC | PRN
  Administered 2017-12-19: 50 ug via INTRAVENOUS

## 2017-12-19 MED ORDER — GELATIN ABSORBABLE 12-7 MM EX MISC
CUTANEOUS | Status: AC
  Filled 2017-12-19: qty 1

## 2017-12-19 MED ORDER — FENTANYL CITRATE (PF) 100 MCG/2ML IJ SOLN
INTRAMUSCULAR | Status: AC
  Filled 2017-12-19: qty 2

## 2017-12-19 MED ORDER — MIDAZOLAM HCL 2 MG/2ML IJ SOLN
INTRAMUSCULAR | Status: AC
  Filled 2017-12-19: qty 2

## 2017-12-19 MED ORDER — LIDOCAINE-EPINEPHRINE 1 %-1:100000 IJ SOLN
INTRAMUSCULAR | Status: AC
  Filled 2017-12-19: qty 1

## 2017-12-19 MED ORDER — SODIUM CHLORIDE 0.9 % IV SOLN
INTRAVENOUS | Status: AC | PRN
  Administered 2017-12-19: 10 mL/h via INTRAVENOUS

## 2017-12-19 MED ORDER — MIDAZOLAM HCL 2 MG/2ML IJ SOLN
INTRAMUSCULAR | Status: AC | PRN
  Administered 2017-12-19 (×2): 1 mg via INTRAVENOUS

## 2017-12-19 NOTE — Procedures (Signed)
Pre Procedure Dx: Worsening renal function Post Procedural Dx: Same  Technically successful US guided biopsy of inferior pole of the right kidney  EBL: None  No immediate complications.   Ronny Bacon, MD Pager #: [phone]

## 2017-12-19 NOTE — Progress Notes (Addendum)
[facility] ROUNDING NOTE   Subjective:   Interval History: has no complaint today. He denied acute complaints overnight or with the procedure.  Objective:  Vital signs in last 24 hours:  Temp:  [97.9 F (36.6 C)-98.4 F (36.9 C)] 98.3 F (36.8 C) (07/24 0926) Pulse Rate:  [66-83] 66 (07/24 0926) Resp:  [15-22] 22 (07/24 0926) BP: (118-138)/(66-86) 125/66 (07/24 0926) SpO2:  [94 %-99 %] 94 % (07/24 0926) Weight:  [225 lb 1.4 oz (102.1 kg)] 225 lb 1.4 oz (102.1 kg) (07/23 2152)  Weight change: 1.4 oz (0.041 kg) Filed Weights   12/17/17 1719 12/18/17 0100 12/18/17 2152  Weight: 225 lb (102.1 kg) 225 lb 1.4 oz (102.1 kg) 225 lb 1.4 oz (102.1 kg)    Intake/Output: I/O last 3 completed shifts: In: 3201.8 [P.O.:600; I.V.:2601.8] Out: 0    Intake/Output this shift:  No intake/output data recorded.  Physical Exam: General: A/O x4, in no acute distress, afebrile, nondiaphoretic CVS- RRR, no mrg's RS- CTA bilaterally ABD- BS present soft non-distended, nontender EXT- no edema bilaterally   Basic Metabolic Panel: Recent Labs  Lab 12/17/17 1144 12/17/17 1727 12/18/17 0510 12/19/17 0504  NA 138 136 138 139  K 4.4 5.8* 4.2 4.4  CL 107 105 109 111  CO2 21* 21* 20* 20*  GLUCOSE 96 87 94 112*  BUN 67* 66* 65* 64*  CREATININE 5.22* 5.32* 4.93* 4.82*  CALCIUM 10.8* 10.9* 9.8 9.6  PHOS  --   --  5.1*  --     Liver Function Tests: Recent Labs  Lab 12/17/17 1144 12/17/17 1727  AST 16 29  ALT 14 15  ALKPHOS 69 72  BILITOT 1.1 1.1  PROT 6.7 6.5  ALBUMIN 4.2 4.2   No results for input(s): LIPASE, AMYLASE in the last 168 hours. No results for input(s): AMMONIA in the last 168 hours.  CBC: Recent Labs  Lab 12/17/17 1144 12/17/17 1727 12/18/17 0510 12/19/17 0504  WBC 78.3* 89.8* 81.9* 71.1*  NEUTROABS 3.9 4.5  --   --   HGB 11.0* 10.9* 9.7* 9.4*  HCT 33.9* 34.9* 31.2* 29.9*  MCV 94.7 96.4 95.7 95.5  PLT 102* 112* 97* 87*    Cardiac  Enzymes: No results for input(s): CKTOTAL, CKMB, CKMBINDEX, TROPONINI in the last 168 hours.  BNP: Invalid input(s): POCBNP  CBG: No results for input(s): GLUCAP in the last 168 hours.  Microbiology: Results for orders placed or performed during the hospital encounter of 12/17/17  MRSA PCR Screening     Status: Abnormal   Collection Time: 12/18/17  5:40 AM  Result Value Ref Range Status   MRSA by PCR POSITIVE (A) NEGATIVE Final    Comment:        The GeneXpert MRSA Assay (FDA approved for NASAL specimens only), is one component of a comprehensive MRSA colonization surveillance program. It is not intended to diagnose MRSA infection nor to guide or monitor treatment for MRSA infections. RESULT CALLED TO, READ BACK BY AND VERIFIED WITH: Lorre Munroe RN 12/18/17 0709 JDW Performed at [facility], [address], [place], [place] [address]     Coagulation Studies: Recent Labs    12/19/17 0504  LABPROT 15.1  INR 1.20    Urinalysis: Recent Labs    12/17/17 1725  COLORURINE STRAW*  LABSPEC 1.010  PHURINE 6.0  GLUCOSEU NEGATIVE  HGBUR MODERATE*  BILIRUBINUR NEGATIVE  KETONESUR NEGATIVE  PROTEINUR 30*  NITRITE NEGATIVE  LEUKOCYTESUR TRACE*      Imaging:  US Renal  Result Date: 12/17/2017 CLINICAL DATA:  Elevated creatinine EXAM: RENAL / URINARY TRACT ULTRASOUND COMPLETE COMPARISON:  PET CT 11/15/2017 FINDINGS: Right Kidney: Length: 12.7 cm. Echogenicity within normal limits. No mass or hydronephrosis visualized. Left Kidney: Length: 12.5 cm. Echogenicity within normal limits. No mass or hydronephrosis visualized. Bladder: Appears normal for degree of bladder distention. Incidentally noted are bilateral pelvic masses adjacent to the bladder, measuring 6.5 x 3.3 x 3.2 cm on the right and 4 x 2.1 x 3 cm on the left. IMPRESSION: 1. Negative for hydronephrosis 2. Bilateral pelvic masses adjacent to the bladder, presumably representing enlarged nodes/lymphadenopathy noted on  comparison PET-CT. Electronically Signed   By: Donavan Foil M.D.   On: 12/17/2017 23:08   US Biopsy (kidney)  Result Date: 12/19/2017 INDICATION: Acute on chronic renal disease. Please perform random renal biopsy for tissue diagnostic purposes. EXAM: ULTRASOUND GUIDED RENAL BIOPSY COMPARISON:  Renal ultrasound - 12/17/2017; PET-CT - 11/15/2017 MEDICATIONS: None. ANESTHESIA/SEDATION: Fentanyl 50 mcg IV; Versed 2 mg IV Total Moderate Sedation time: 10 minutes; The patient was continuously monitored during the procedure by the interventional radiology nurse under my direct supervision. COMPLICATIONS: None immediate. PROCEDURE: Informed written consent was obtained from the patient after a discussion of the risks, benefits and alternatives to treatment. The patient understands and consents the procedure. A timeout was performed prior to the initiation of the procedure. Ultrasound scanning was performed of the bilateral flanks. The inferior pole of the right kidney was selected for biopsy due to location and sonographic window. The procedure was planned. The operative site was prepped and draped in the usual sterile fashion. The overlying soft tissues were anesthetized with 1% lidocaine with epinephrine. A 17 gauge core needle biopsy device was advanced into the inferior cortex of the right kidney and 2 core biopsies were obtained under direct ultrasound guidance. Images were saved for documentation purposes. The biopsy device was removed and hemostasis was obtained with manual compression. Post procedural scanning was negative for significant post procedural hemorrhage or additional complication. A dressing was placed. The patient tolerated the procedure well without immediate post procedural complication. IMPRESSION: Technically successful ultrasound guided right renal biopsy. Electronically Signed   By: Sandi Mariscal M.D.   On: 12/19/2017 11:13     Medications:   . sodium chloride 100 mL/hr at 12/18/17 2154    . allopurinol  100 mg Oral Daily  . amLODipine  5 mg Oral Daily  . carvedilol  12.5 mg Oral BID WC  . Chlorhexidine Gluconate Cloth  6 each Topical Q0600  . fentaNYL      . lidocaine-EPINEPHrine      . midazolam      . multivitamin with minerals  1 tablet Oral Daily  . mupirocin ointment  1 application Nasal BID  . tamsulosin  0.4 mg Oral Daily   neomycin-bacitracin-polymyxin  Assessment/ Plan:  Patient Profile: Andrew [name] is a 77 y.o. male w/ a PMHx notable for HTN, rapidly progressively worsening CKD, A-fib, and CLL started on chemotherapy on July 15th with worsening renal function. He was seen by his oncologist and nephrologist on the day of admission when his renal function was noted to have increased from 4.76 to 5.22-32 prompting him to be sent to the ED for evaluation. US kidneys failed to demonstrate an obstructive pattern. Aside from initiating chemotherapy he has not altered his medication regimen or increased/initiated NSAID use. He was admitted for and acute on chronic renal failure.   Assessment/Plan: 1.Renal- Acute  renal injury on CKD - No clear etiology with regard to the progressing CKD III. Biopsy ordered and performed. AKI resolved. Likely stable for discharge and follow-up with nephrology as an outpatient upon completing the colleciton of his 24hr urine protein. Would hold ARB'ACEi's, NSAIDS, nephrotoxic agents on discharge.  Interesting case with progressive renal failure and no identifiable cause will see what the renal biopsy shows   2. Hypertension/volume - BP stable, 130/80 average. Agree with Amlodipine 10mg , agree with holding ARB and any nephrotoxic agents. Agree with gentle rehydration.  -Daily BMP's recommended  3. CLL - As per primary team 4. Anemia  - Normocytic, Hgb stable, most likely 2/2 his CLL. Would monitor for now.    LOS: 0 Kathi Ludwig, MD Internal Medicine PGY-2

## 2017-12-19 NOTE — Progress Notes (Signed)
[facility]                                                                              Patient Demographics  Andrew [name], is a 77 y.o. male, DOB - 12-14-1940, MBW:[id number]  Admit date - 12/17/2017   Admitting Physician Phillips Grout, MD  Outpatient Primary MD for the patient is [name], MD  Outpatient specialists:   LOS - 0  days   Medical records reviewed and are as summarized below:    Chief Complaint  Patient presents with  . Abnormal Lab       Brief summary   Patient is a 77 year old male with hypertension, CKD, atrial fibrillation, diagnosed with CLL, started chemotherapy on July 15 and since then having worsening renal failure.  Patient saw both his oncologist and nephrologist and creatinine was noted to be over 5 and was sent to ED.  Patient otherwise denied any nausea, vomiting or diarrhea, recent illness or increased use of NSAIDs.  Patient's creatinine was 3.9 on 12/03/2017 before the initiation of chemotherapy and 4.7 on the day of chemo was started on 7/15.  Patient reports he receives chemotherapy every 15 days, last chemo on 7/15.   Assessment & Plan    Principal Problem:   AKI (acute kidney injury) ([place]) -Multifactorial, likely due to chemo, Micardis, dehydration -Renal ultrasound negative for any hydronephrosis or obstruction -Continue IV fluid hydration, follow closely for tumor lysis syndrome, uric acid borderline 8.0, LDH normal -Creatinine at 4.8, pending renal biopsy today, nephrology following  Active Problems:   CLL (chronic lymphocytic leukemia) ([facility]) -WBC count 93.8 on 7/15 when chemotherapy was started, 89.8 on 7/22 -No fevers or infectious signs or symptoms -White count trending down, blood cultures negative so far  Paroxysmal A-fib ([place]) -History of remote DCCV 15 years ago, per neurosurgery not a candidate for at least 2 months for antiplatelets or anticoagulation given history of subdural hematoma (per cardiology  note outpatient in 09/2017 by Dr. Harl Bowie)  History of subdural hematoma -Status post craniotomy with evacuation on 10/14/2016, followed by Dr. Sherwood Gambler  Hypertension BP stable, continue Coreg, amlodipine  Chronic anemia of malignancy/CLL H&H stable, currently close to baseline.  Some component of hemodilution.   Baseline 11.0  Code Status: Full CODE STATUS DVT Prophylaxis: SCDs Family Communication: Discussed in detail with the patient, all imaging results, lab results explained to the patient   Disposition Plan: Work-up in progress Time Spent in minutes   25 minutes  Procedures:  None  Consultants:   Nephrology  Antimicrobials:      Medications  Scheduled Meds: . allopurinol  100 mg Oral Daily  . amLODipine  5 mg Oral Daily  . carvedilol  12.5 mg Oral BID WC  . Chlorhexidine Gluconate Cloth  6 each Topical Q0600  . fentaNYL      . lidocaine-EPINEPHrine      . midazolam      . multivitamin with minerals  1 tablet Oral Daily  . mupirocin ointment  1 application Nasal BID  . tamsulosin  0.4 mg Oral Daily   Continuous Infusions: . sodium chloride 100 mL/hr at 12/18/17  2154   PRN Meds:.neomycin-bacitracin-polymyxin   Antibiotics   Anti-infectives (From admission, onward)   None        Subjective:   Lorne Winkels was seen and examined today.  No complaints, awaiting renal biopsy at the time of my examination.  No acute issues, no fevers or chills.  No abdominal pain nausea vomiting or diarrhea.    Objective:   Vitals:   12/19/17 0827 12/19/17 0840 12/19/17 0846 12/19/17 0926  BP: 134/86 138/85  125/66  Pulse: 83 77 81 66  Resp: 15 15 16  (!) 22  Temp:    98.3 F (36.8 C)  TempSrc:    Oral  SpO2: 99% 98% 95% 94%  Weight:      Height:        Intake/Output Summary (Last 24 hours) at 12/19/2017 1511 Last data filed at 12/19/2017 0600 Gross per 24 hour  Intake 2033.32 ml  Output 0 ml  Net 2033.32 ml     Wt Readings from Last 3 Encounters:    12/18/17 102.1 kg (225 lb 1.4 oz)  12/17/17 101.5 kg (223 lb 12.8 oz)  12/06/17 102.5 kg (226 lb)     Exam    General: Alert and oriented x 3, NAD  Eyes:   HEENT:  Atraumatic, normocephalic  Cardiovascular: S1 S2 auscultated, Regular rate and rhythm. No pedal edema b/l  Respiratory: CTA B  Gastrointestinal: Soft, nontender, nondistended, + bowel sounds  Ext: no pedal edema bilaterally  Neuro: no new deficits  Musculoskeletal: No digital cyanosis, clubbing  Skin: No rashes  Psych: Normal affect and demeanor, alert and oriented x3     Data Reviewed:  I have personally reviewed following labs and imaging studies  Micro Results Recent Results (from the past 240 hour(s))  MRSA PCR Screening     Status: Abnormal   Collection Time: 12/18/17  5:40 AM  Result Value Ref Range Status   MRSA by PCR POSITIVE (A) NEGATIVE Final    Comment:        The GeneXpert MRSA Assay (FDA approved for NASAL specimens only), is one component of a comprehensive MRSA colonization surveillance program. It is not intended to diagnose MRSA infection nor to guide or monitor treatment for MRSA infections. RESULT CALLED TO, READ BACK BY AND VERIFIED WITH: Lorre Munroe RN 12/18/17 0709 JDW Performed at [facility], [address] [place], [place] [address]   Culture, blood (routine x 2)     Status: None (Preliminary result)   Collection Time: 12/18/17  1:24 PM  Result Value Ref Range Status   Specimen Description BLOOD RIGHT ANTECUBITAL  Final   Special Requests   Final    BOTTLES DRAWN AEROBIC ONLY Blood Culture adequate volume   Culture   Final    NO GROWTH 1 DAY Performed at [facility], [place] [address], [place], [place] [address]    Report Status PENDING  Incomplete  Culture, blood (routine x 2)     Status: None (Preliminary result)   Collection Time: 12/18/17  1:30 PM  Result Value Ref Range Status   Specimen Description BLOOD RIGHT HAND  Final   Special Requests    Final    BOTTLES DRAWN AEROBIC ONLY Blood Culture adequate volume   Culture   Final    NO GROWTH 1 DAY Performed at [facility], [place] [address], [place], [place] [address]    Report Status PENDING  Incomplete    Radiology Reports US Renal  Result Date: 12/17/2017 CLINICAL DATA:  Elevated creatinine EXAM: RENAL / URINARY TRACT ULTRASOUND COMPLETE COMPARISON:  PET CT 11/15/2017 FINDINGS: Right Kidney: Length: 12.7 cm. Echogenicity within normal limits. No mass or hydronephrosis visualized. Left Kidney: Length: 12.5 cm. Echogenicity within normal limits. No mass or hydronephrosis visualized. Bladder: Appears normal for degree of bladder distention. Incidentally noted are bilateral pelvic masses adjacent to the bladder, measuring 6.5 x 3.3 x 3.2 cm on the right and 4 x 2.1 x 3 cm on the left. IMPRESSION: 1. Negative for hydronephrosis 2. Bilateral pelvic masses adjacent to the bladder, presumably representing enlarged nodes/lymphadenopathy noted on comparison PET-CT. Electronically Signed   By: Donavan Foil M.D.   On: 12/17/2017 23:08   US Biopsy (kidney)  Result Date: 12/19/2017 INDICATION: Acute on chronic renal disease. Please perform random renal biopsy for tissue diagnostic purposes. EXAM: ULTRASOUND GUIDED RENAL BIOPSY COMPARISON:  Renal ultrasound - 12/17/2017; PET-CT - 11/15/2017 MEDICATIONS: None. ANESTHESIA/SEDATION: Fentanyl 50 mcg IV; Versed 2 mg IV Total Moderate Sedation time: 10 minutes; The patient was continuously monitored during the procedure by the interventional radiology nurse under my direct supervision. COMPLICATIONS: None immediate. PROCEDURE: Informed written consent was obtained from the patient after a discussion of the risks, benefits and alternatives to treatment. The patient understands and consents the procedure. A timeout was performed prior to the initiation of the procedure. Ultrasound scanning was performed of the bilateral flanks. The inferior pole of the  right kidney was selected for biopsy due to location and sonographic window. The procedure was planned. The operative site was prepped and draped in the usual sterile fashion. The overlying soft tissues were anesthetized with 1% lidocaine with epinephrine. A 17 gauge core needle biopsy device was advanced into the inferior cortex of the right kidney and 2 core biopsies were obtained under direct ultrasound guidance. Images were saved for documentation purposes. The biopsy device was removed and hemostasis was obtained with manual compression. Post procedural scanning was negative for significant post procedural hemorrhage or additional complication. A dressing was placed. The patient tolerated the procedure well without immediate post procedural complication. IMPRESSION: Technically successful ultrasound guided right renal biopsy. Electronically Signed   By: Sandi Mariscal M.D.   On: 12/19/2017 11:13    Lab Data:  CBC: Recent Labs  Lab 12/17/17 1144 12/17/17 1727 12/18/17 0510 12/19/17 0504  WBC 78.3* 89.8* 81.9* 71.1*  NEUTROABS 3.9 4.5  --   --   HGB 11.0* 10.9* 9.7* 9.4*  HCT 33.9* 34.9* 31.2* 29.9*  MCV 94.7 96.4 95.7 95.5  PLT 102* 112* 97* 87*   Basic Metabolic Panel: Recent Labs  Lab 12/17/17 1144 12/17/17 1727 12/18/17 0510 12/19/17 0504  NA 138 136 138 139  K 4.4 5.8* 4.2 4.4  CL 107 105 109 111  CO2 21* 21* 20* 20*  GLUCOSE 96 87 94 112*  BUN 67* 66* 65* 64*  CREATININE 5.22* 5.32* 4.93* 4.82*  CALCIUM 10.8* 10.9* 9.8 9.6  PHOS  --   --  5.1*  --    GFR: Estimated Creatinine Clearance: 16.6 mL/min (A) (by C-G formula based on SCr of 4.82 mg/dL (H)). Liver Function Tests: Recent Labs  Lab 12/17/17 1144 12/17/17 1727  AST 16 29  ALT 14 15  ALKPHOS 69 72  BILITOT 1.1 1.1  PROT 6.7 6.5  ALBUMIN 4.2 4.2   No results for input(s): LIPASE, AMYLASE in the last 168 hours. No results for input(s): AMMONIA in the last 168 hours. Coagulation Profile: Recent Labs  Lab  12/19/17 0504  INR 1.20   Cardiac Enzymes: No results for input(s): CKTOTAL, CKMB, CKMBINDEX, TROPONINI in the last 168 hours. BNP (last 3 results) No results for input(s): PROBNP in the last 8760 hours. HbA1C: No results for input(s): HGBA1C in the last 72 hours. CBG: No results for input(s): GLUCAP in the last 168 hours. Lipid Profile: No results for input(s): CHOL, HDL, LDLCALC, TRIG, CHOLHDL, LDLDIRECT in the last 72 hours. Thyroid Function Tests: No results for input(s): TSH, T4TOTAL, FREET4, T3FREE, THYROIDAB in the last 72 hours. Anemia Panel: No results for input(s): VITAMINB12, FOLATE, FERRITIN, TIBC, IRON, RETICCTPCT in the last 72 hours. Urine analysis:    Component Value Date/Time   COLORURINE STRAW (A) 12/17/2017 1725   APPEARANCEUR CLEAR 12/17/2017 1725   LABSPEC 1.010 12/17/2017 1725   PHURINE 6.0 12/17/2017 1725   GLUCOSEU NEGATIVE 12/17/2017 1725   HGBUR MODERATE (A) 12/17/2017 1725   BILIRUBINUR NEGATIVE 12/17/2017 1725   KETONESUR NEGATIVE 12/17/2017 1725   PROTEINUR 30 (A) 12/17/2017 1725   NITRITE NEGATIVE 12/17/2017 1725   LEUKOCYTESUR TRACE (A) 12/17/2017 1725     Ripudeep Rai M.D. [facility] 12/19/2017, 3:11 PM  Pager: [phone] Between 7am to 7pm - call Pager - [phone]  After 7pm go to [email or web address] - password [email or web address]  Call night coverage person covering after 7pm

## 2017-12-20 DIAGNOSIS — N183 Chronic kidney disease, stage 3 (moderate): Secondary | ICD-10-CM

## 2017-12-20 LAB — BASIC METABOLIC PANEL
Anion gap: 8 (ref 5–15)
BUN: 63 mg/dL — AB (ref 8–23)
CO2: 21 mmol/L — ABNORMAL LOW (ref 22–32)
Calcium: 9.6 mg/dL (ref 8.9–10.3)
Chloride: 111 mmol/L (ref 98–111)
Creatinine, Ser: 4.8 mg/dL — ABNORMAL HIGH (ref 0.61–1.24)
GFR calc Af Amer: 12 mL/min — ABNORMAL LOW (ref >60)
GFR calc non Af Amer: 11 mL/min — ABNORMAL LOW (ref >60)
Glucose, Bld: 122 mg/dL — ABNORMAL HIGH (ref 70–99)
POTASSIUM: 4.3 mmol/L (ref 3.5–5.1)
Sodium: 140 mmol/L (ref 135–145)

## 2017-12-20 LAB — CBC
HEMATOCRIT: 29.5 % — AB (ref 39.0–52.0)
Hemoglobin: 9.3 g/dL — ABNORMAL LOW (ref 13.0–17.0)
MCH: 30.5 pg (ref 26.0–34.0)
MCHC: 31.5 g/dL (ref 30.0–36.0)
MCV: 96.7 fL (ref 78.0–100.0)
Platelets: 82 10*3/uL — ABNORMAL LOW (ref 150–400)
RBC: 3.05 MIL/uL — ABNORMAL LOW (ref 4.22–5.81)
RDW: 13.5 % (ref 11.5–15.5)
WBC: 60.1 10*3/uL (ref 4.0–10.5)

## 2017-12-20 MED ORDER — CEPHALEXIN 500 MG PO CAPS
500.0000 mg | ORAL_CAPSULE | Freq: Two times a day (BID) | ORAL | Status: DC
  Administered 2017-12-20 – 2017-12-22 (×5): 500 mg via ORAL
  Filled 2017-12-20 (×5): qty 1

## 2017-12-20 MED ORDER — SODIUM CHLORIDE 0.9 % IV SOLN
250.0000 mg | Freq: Every day | INTRAVENOUS | Status: DC
  Administered 2017-12-20 – 2017-12-21 (×2): 250 mg via INTRAVENOUS
  Filled 2017-12-20: qty 2
  Filled 2017-12-20: qty 4
  Filled 2017-12-20: qty 2

## 2017-12-20 NOTE — Progress Notes (Addendum)
[facility] ROUNDING NOTE   Subjective:   Interval History: has no complaints today.  He said that he feels well has been able to discharge home once determined to be stable.  Patient states that he continues to produce normal amounts of urine but that this is not being collected and measured..  Objective:  Vital signs in last 24 hours:  Temp:  [97.9 F (36.6 C)-98.3 F (36.8 C)] 98.1 F (36.7 C) (07/25 0456) Pulse Rate:  [64-83] 75 (07/25 0456) Resp:  [15-22] 16 (07/25 0456) BP: (118-138)/(66-86) 130/83 (07/25 0456) SpO2:  [94 %-99 %] 95 % (07/25 0456)  Weight change:  Filed Weights   12/17/17 1719 12/18/17 0100 12/18/17 2152  Weight: 225 lb (102.1 kg) 225 lb 1.4 oz (102.1 kg) 225 lb 1.4 oz (102.1 kg)    Intake/Output: I/O last 3 completed shifts: In: 4532.9 [P.O.:240; I.V.:4292.9] Out: 0    Intake/Output this shift:  No intake/output data recorded.   Physical exam: General: Alert and oriented x4, no acute distress, nondiaphoretic, afebrile CVS- RRR, no murmurs rubs or gallops auscultated RS- CTA bilaterally bilaterally, ABD- BS present soft non-distended EXT- no edema there is notable ulceration of the right posterior calf secondary to recent dermatological biopsy without sufficient healing and not appears acutely infected.   Basic Metabolic Panel: Recent Labs  Lab 12/17/17 1144 12/17/17 1727 12/18/17 0510 12/19/17 0504 12/20/17 0433  NA 138 136 138 139 140  K 4.4 5.8* 4.2 4.4 4.3  CL 107 105 109 111 111  CO2 21* 21* 20* 20* 21*  GLUCOSE 96 87 94 112* 122*  BUN 67* 66* 65* 64* 63*  CREATININE 5.22* 5.32* 4.93* 4.82* 4.80*  CALCIUM 10.8* 10.9* 9.8 9.6 9.6  PHOS  --   --  5.1*  --   --     Liver Function Tests: Recent Labs  Lab 12/17/17 1144 12/17/17 1727  AST 16 29  ALT 14 15  ALKPHOS 69 72  BILITOT 1.1 1.1  PROT 6.7 6.5  ALBUMIN 4.2 4.2   No results for input(s): LIPASE, AMYLASE in the last 168 hours. No results for input(s):  AMMONIA in the last 168 hours.  CBC: Recent Labs  Lab 12/17/17 1144 12/17/17 1727 12/18/17 0510 12/19/17 0504 12/20/17 0433  WBC 78.3* 89.8* 81.9* 71.1* 60.1*  NEUTROABS 3.9 4.5  --   --   --   HGB 11.0* 10.9* 9.7* 9.4* 9.3*  HCT 33.9* 34.9* 31.2* 29.9* 29.5*  MCV 94.7 96.4 95.7 95.5 96.7  PLT 102* 112* 97* 87* 82*    Cardiac Enzymes: No results for input(s): CKTOTAL, CKMB, CKMBINDEX, TROPONINI in the last 168 hours.  BNP: Invalid input(s): POCBNP  CBG: No results for input(s): GLUCAP in the last 168 hours.  Microbiology: Results for orders placed or performed during the hospital encounter of 12/17/17  MRSA PCR Screening     Status: Abnormal   Collection Time: 12/18/17  5:40 AM  Result Value Ref Range Status   MRSA by PCR POSITIVE (A) NEGATIVE Final    Comment:        The GeneXpert MRSA Assay (FDA approved for NASAL specimens only), is one component of a comprehensive MRSA colonization surveillance program. It is not intended to diagnose MRSA infection nor to guide or monitor treatment for MRSA infections. RESULT CALLED TO, READ BACK BY AND VERIFIED WITH: Lorre Munroe RN 12/18/17 0709 JDW Performed at [facility], [address], [place], [place] [address]   Culture, blood (routine  x 2)     Status: None (Preliminary result)   Collection Time: 12/18/17  1:24 PM  Result Value Ref Range Status   Specimen Description BLOOD RIGHT ANTECUBITAL  Final   Special Requests   Final    BOTTLES DRAWN AEROBIC ONLY Blood Culture adequate volume   Culture   Final    NO GROWTH 1 DAY Performed at [facility], [place] [address], [place], [place] [address]    Report Status PENDING  Incomplete  Culture, blood (routine x 2)     Status: None (Preliminary result)   Collection Time: 12/18/17  1:30 PM  Result Value Ref Range Status   Specimen Description BLOOD RIGHT HAND  Final   Special Requests   Final    BOTTLES DRAWN AEROBIC ONLY Blood Culture adequate volume   Culture    Final    NO GROWTH 1 DAY Performed at [facility], [place] [address], [place], [place] [address]    Report Status PENDING  Incomplete    Coagulation Studies: Recent Labs    12/19/17 0504  LABPROT 15.1  INR 1.20    Urinalysis: Recent Labs    12/17/17 1725  COLORURINE STRAW*  LABSPEC 1.010  PHURINE 6.0  GLUCOSEU NEGATIVE  HGBUR MODERATE*  BILIRUBINUR NEGATIVE  KETONESUR NEGATIVE  PROTEINUR 30*  NITRITE NEGATIVE  LEUKOCYTESUR TRACE*      Imaging: US Biopsy (kidney)  Result Date: 12/19/2017 INDICATION: Acute on chronic renal disease. Please perform random renal biopsy for tissue diagnostic purposes. EXAM: ULTRASOUND GUIDED RENAL BIOPSY COMPARISON:  Renal ultrasound - 12/17/2017; PET-CT - 11/15/2017 MEDICATIONS: None. ANESTHESIA/SEDATION: Fentanyl 50 mcg IV; Versed 2 mg IV Total Moderate Sedation time: 10 minutes; The patient was continuously monitored during the procedure by the interventional radiology nurse under my direct supervision. COMPLICATIONS: None immediate. PROCEDURE: Informed written consent was obtained from the patient after a discussion of the risks, benefits and alternatives to treatment. The patient understands and consents the procedure. A timeout was performed prior to the initiation of the procedure. Ultrasound scanning was performed of the bilateral flanks. The inferior pole of the right kidney was selected for biopsy due to location and sonographic window. The procedure was planned. The operative site was prepped and draped in the usual sterile fashion. The overlying soft tissues were anesthetized with 1% lidocaine with epinephrine. A 17 gauge core needle biopsy device was advanced into the inferior cortex of the right kidney and 2 core biopsies were obtained under direct ultrasound guidance. Images were saved for documentation purposes. The biopsy device was removed and hemostasis was obtained with manual compression. Post procedural scanning was negative for  significant post procedural hemorrhage or additional complication. A dressing was placed. The patient tolerated the procedure well without immediate post procedural complication. IMPRESSION: Technically successful ultrasound guided right renal biopsy. Electronically Signed   By: Sandi Mariscal M.D.   On: 12/19/2017 11:13     Medications:   . sodium chloride 100 mL/hr at 12/20/17 0222   . allopurinol  100 mg Oral Daily  . amLODipine  5 mg Oral Daily  . carvedilol  12.5 mg Oral BID WC  . Chlorhexidine Gluconate Cloth  6 each Topical Q0600  . multivitamin with minerals  1 tablet Oral Daily  . mupirocin ointment  1 application Nasal BID  . tamsulosin  0.4 mg Oral Daily   neomycin-bacitracin-polymyxin  Assessment/ Plan:  Patient Profile: Andrew [name] a 77 y.o.malew/ a PMHx notable for HTN, rapidly progressively worsening  CKD, A-fib, and CLL started on chemotherapy on July 15th with worsening renal function. He was seen by his oncologist and nephrologist on the day of admission when his renal function was noted to have increased from 4.76 to 5.22-32 prompting him to be sent to the ED for evaluation. Renal US failed to demonstrate an obstructive etiology. Aside from initiating chemotherapy he has not altered his medication regimen or increased/initiated NSAID use. He was admitted for and acute on chronic renal failure.The acute nature resolved but a renal biopsy was obtained to evaluate the etiology of the chronic failure.   Assessment/Plan: 1.Renal-Acute renal injury on CKD -No clear etiology with regard to the progressing CKD III. Biopsy ordered and performed, awaiting results. AKI resolved.  Follow-up, renal biopsy.  Continue to avoid nephrotoxic agents and ACE inhibitors, or ARB's, as well as NSAIDs.  Creatinine stable today.   2. Hypertension/volume -BP stable, 130/80 average. Agree with Amlodipine 10mg , agree with holding ARB and any nephrotoxic agents. Agree with gentle rehydration.   -Daily BMP's recommended 3. CLL -As per primary team 4. Anemia -Normocytic, Hgb stable, most likely 2/2 his CLL. Would monitor for now. 5. Cellulitis secondary to a dermatological biopsy: I have called and spoken with his dermatologists office with Dr. Lovey Newcomer. 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  Mobility infection at this most likely secondary to mild venous stasis the distal extremities in conjunction with the recent biopsy.   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 I think a couple of days of IV solumedrol 250mg  IV  X 3 d followed by a prednisone taper.   I have seen and examined this patient and agree with the plan of care   [facility] 12/20/2017, 4:35 PM    LOS: 0 Kathi Ludwig, MD Internal Medicine PGY-2

## 2017-12-20 NOTE — Progress Notes (Addendum)
[facility]                                                                              Patient Demographics  Andrew [name], is a 77 y.o. male, DOB - [birth year], MEQ:[id number]  Admit date - 12/17/2017   Admitting Physician Andrew Grout, MD  Outpatient Primary MD for the patient is Andrew Squibb, MD  Outpatient specialists:   LOS - 0  days   Medical records reviewed and are as summarized below:    Chief Complaint  Patient presents with  . Abnormal Lab       Brief summary   Patient is a 77 year old male with hypertension, CKD, atrial fibrillation, diagnosed with CLL, started chemotherapy on July 15 and since then having worsening renal failure.  Patient saw both his oncologist and nephrologist and creatinine was noted to be over 5 and was sent to ED.  Patient otherwise denied any nausea, vomiting or diarrhea, recent illness or increased use of NSAIDs.  Patient's creatinine was 3.9 on 12/03/2017 before the initiation of chemotherapy and 4.7 on the day of chemo was started on 7/15.  Patient reports he receives chemotherapy every 15 days, last chemo on 7/15.   Assessment & Plan    Principal Problem:   AKI (acute kidney injury) ([place]) superimposed on CKD stage III -Baseline 1.5-2.0, multifactorial, likely due to chemo, Micardis, dehydration -Renal ultrasound negative for any hydronephrosis or obstruction -Continue IV fluid hydration -Creatinine plateaued at 4.8, status post renal biopsy, results pending  Active Problems:   CLL (chronic lymphocytic leukemia) (HCC) -WBC count 93.8 on 7/15 when chemotherapy was started.  Counts trending down 60.1 today -No fevers or infectious signs or symptoms -White count trending down, blood cultures negative so far  Cellulitis on the right lower extremity biopsy site -Placed on Keflex, wound care consulted  Paroxysmal A-fib ([place]) -History of remote DCCV 15 years ago, per neurosurgery not a candidate for at least 2 months  for antiplatelets or anticoagulation given history of subdural hematoma (per cardiology note outpatient in 09/2017 by Dr. Harl [name])  History of subdural hematoma -Status post craniotomy with evacuation on 10/14/2016, followed by Dr. Sherwood [name]  Hypertension BP stable, continue Coreg, amlodipine  Chronic anemia of malignancy/CLL H&H stable, currently close to baseline.  Some component of hemodilution.   Baseline 11.0  Code Status: Full CODE STATUS DVT Prophylaxis: SCDs Family Communication: Discussed in detail with the patient, all imaging results, lab results explained to the patient and wife at the bedside   Disposition Plan: Work-up in progress Time Spent in minutes   25 minutes  Procedures:  None  Consultants:   Nephrology  Antimicrobials:   Keflex 7/25>>   Medications  Scheduled Meds: . allopurinol  100 mg Oral Daily  . amLODipine  5 mg Oral Daily  . carvedilol  12.5 mg Oral BID WC  . cephALEXin  500 mg Oral Q12H  . Chlorhexidine Gluconate Cloth  6 each Topical Q0600  . multivitamin with minerals  1 tablet Oral Daily  . mupirocin ointment  1 application Nasal BID  . tamsulosin  0.4 mg Oral Daily   Continuous Infusions: .  sodium chloride 100 mL/hr at 12/20/17 1302   PRN Meds:.neomycin-bacitracin-polymyxin   Antibiotics   Anti-infectives (From admission, onward)   Start     Dose/Rate Route Frequency Ordered Stop   12/20/17 1000  cephALEXin (KEFLEX) capsule 500 mg     500 mg Oral Every 12 hours 12/20/17 5400          Subjective:   Andrew [name] was seen and examined today.  Denies any specific complaints except tenderness and wound looking worse at the right lower extremity biopsy site.  No fevers or chills. No abdominal pain nausea vomiting or diarrhea.    Objective:   Vitals:   12/19/17 1650 12/19/17 2033 12/20/17 0456 12/20/17 0908  BP: 118/74 128/78 130/83 135/72  Pulse: 65 64 75 74  Resp: 20 16 16 18   Temp: 97.9 F (36.6 C) 97.9 F (36.6 C) 98.1  F (36.7 C) 97.9 F (36.6 C)  TempSrc: Oral Oral Oral Oral  SpO2: 95% 94% 95% 96%  Weight:      Height:        Intake/Output Summary (Last 24 hours) at 12/20/2017 1309 Last data filed at 12/20/2017 0900 Gross per 24 hour  Intake 2739.62 ml  Output 0 ml  Net 2739.62 ml     Wt Readings from Last 3 Encounters:  12/18/17 102.1 kg (225 lb 1.4 oz)  12/17/17 101.5 kg (223 lb 12.8 oz)  12/06/17 102.5 kg (226 lb)     Exam    General: Alert and oriented x 3, NAD  Eyes:   HEENT:    Cardiovascular: S1 S2 auscultated,Regular rate and rhythm. No pedal edema b/l  Respiratory: Clear to auscultation bilaterally, no wheezing, rales or rhonchi  Gastrointestinal: Soft, nontender, nondistended, + bowel sounds  Ext: no pedal edema bilaterally  Neuro: no new deficits  Musculoskeletal: No digital cyanosis, clubbing  Skin: Ulceration at the right lower extremity biopsy site with erythema and tenderness around it  Psych: Normal affect and demeanor, alert and oriented x3    Data Reviewed:  I have personally reviewed following labs and imaging studies  Micro Results Recent Results (from the past 240 hour(s))  MRSA PCR Screening     Status: Abnormal   Collection Time: 12/18/17  5:40 AM  Result Value Ref Range Status   MRSA by PCR POSITIVE (A) NEGATIVE Final    Comment:        The GeneXpert MRSA Assay (FDA approved for NASAL specimens only), is one component of a comprehensive MRSA colonization surveillance program. It is not intended to diagnose MRSA infection nor to guide or monitor treatment for MRSA infections. RESULT CALLED TO, READ BACK BY AND VERIFIED WITH: Andrew Munroe RN 12/18/17 0709 JDW Performed at [facility], [address], [place], [place] [address]   Culture, blood (routine x 2)     Status: None (Preliminary result)   Collection Time: 12/18/17  1:24 PM  Result Value Ref Range Status   Specimen Description BLOOD RIGHT ANTECUBITAL  Final   Special Requests    Final    BOTTLES DRAWN AEROBIC ONLY Blood Culture adequate volume   Culture   Final    NO GROWTH 1 DAY Performed at [facility], [place] [address], [place], [place] [address]    Report Status PENDING  Incomplete  Culture, blood (routine x 2)     Status: None (Preliminary result)   Collection Time: 12/18/17  1:30 PM  Result Value Ref Range Status   Specimen Description BLOOD  RIGHT HAND  Final   Special Requests   Final    BOTTLES DRAWN AEROBIC ONLY Blood Culture adequate volume   Culture   Final    NO GROWTH 1 DAY Performed at [facility], [address], [place], [place] [address]    Report Status PENDING  Incomplete    Radiology Reports US Renal  Result Date: 12/17/2017 CLINICAL DATA:  Elevated creatinine EXAM: RENAL / URINARY TRACT ULTRASOUND COMPLETE COMPARISON:  PET CT 11/15/2017 FINDINGS: Right Kidney: Length: 12.7 cm. Echogenicity within normal limits. No mass or hydronephrosis visualized. Left Kidney: Length: 12.5 cm. Echogenicity within normal limits. No mass or hydronephrosis visualized. Bladder: Appears normal for degree of bladder distention. Incidentally noted are bilateral pelvic masses adjacent to the bladder, measuring 6.5 x 3.3 x 3.2 cm on the right and 4 x 2.1 x 3 cm on the left. IMPRESSION: 1. Negative for hydronephrosis 2. Bilateral pelvic masses adjacent to the bladder, presumably representing enlarged nodes/lymphadenopathy noted on comparison PET-CT. Electronically Signed   By: Donavan Foil M.D.   On: 12/17/2017 23:08   US Biopsy (kidney)  Result Date: 12/19/2017 INDICATION: Acute on chronic renal disease. Please perform random renal biopsy for tissue diagnostic purposes. EXAM: ULTRASOUND GUIDED RENAL BIOPSY COMPARISON:  Renal ultrasound - 12/17/2017; PET-CT - 11/15/2017 MEDICATIONS: None. ANESTHESIA/SEDATION: Fentanyl 50 mcg IV; Versed 2 mg IV Total Moderate Sedation time: 10 minutes; The patient was continuously monitored during the procedure by the  interventional radiology nurse under my direct supervision. COMPLICATIONS: None immediate. PROCEDURE: Informed written consent was obtained from the patient after a discussion of the risks, benefits and alternatives to treatment. The patient understands and consents the procedure. A timeout was performed prior to the initiation of the procedure. Ultrasound scanning was performed of the bilateral flanks. The inferior pole of the right kidney was selected for biopsy due to location and sonographic window. The procedure was planned. The operative site was prepped and draped in the usual sterile fashion. The overlying soft tissues were anesthetized with 1% lidocaine with epinephrine. A 17 gauge core needle biopsy device was advanced into the inferior cortex of the right kidney and 2 core biopsies were obtained under direct ultrasound guidance. Images were saved for documentation purposes. The biopsy device was removed and hemostasis was obtained with manual compression. Post procedural scanning was negative for significant post procedural hemorrhage or additional complication. A dressing was placed. The patient tolerated the procedure well without immediate post procedural complication. IMPRESSION: Technically successful ultrasound guided right renal biopsy. Electronically Signed   By: Sandi Mariscal M.D.   On: 12/19/2017 11:13    Lab Data:  CBC: Recent Labs  Lab 12/17/17 1144 12/17/17 1727 12/18/17 0510 12/19/17 0504 12/20/17 0433  WBC 78.3* 89.8* 81.9* 71.1* 60.1*  NEUTROABS 3.9 4.5  --   --   --   HGB 11.0* 10.9* 9.7* 9.4* 9.3*  HCT 33.9* 34.9* 31.2* 29.9* 29.5*  MCV 94.7 96.4 95.7 95.5 96.7  PLT 102* 112* 97* 87* 82*   Basic Metabolic Panel: Recent Labs  Lab 12/17/17 1144 12/17/17 1727 12/18/17 0510 12/19/17 0504 12/20/17 0433  NA 138 136 138 139 140  K 4.4 5.8* 4.2 4.4 4.3  CL 107 105 109 111 111  CO2 21* 21* 20* 20* 21*  GLUCOSE 96 87 94 112* 122*  BUN 67* 66* 65* 64* 63*  CREATININE  5.22* 5.32* 4.93* 4.82* 4.80*  CALCIUM 10.8* 10.9* 9.8 9.6 9.6  PHOS  --   --  5.1*  --   --    GFR: Estimated Creatinine Clearance: 16.7 mL/min (A) (by C-G formula based on SCr of 4.8 mg/dL (H)). Liver Function Tests: Recent Labs  Lab 12/17/17 1144 12/17/17 1727  AST 16 29  ALT 14 15  ALKPHOS 69 72  BILITOT 1.1 1.1  PROT 6.7 6.5  ALBUMIN 4.2 4.2   No results for input(s): LIPASE, AMYLASE in the last 168 hours. No results for input(s): AMMONIA in the last 168 hours. Coagulation Profile: Recent Labs  Lab 12/19/17 0504  INR 1.20   Cardiac Enzymes: No results for input(s): CKTOTAL, CKMB, CKMBINDEX, TROPONINI in the last 168 hours. BNP (last 3 results) No results for input(s): PROBNP in the last 8760 hours. HbA1C: No results for input(s): HGBA1C in the last 72 hours. CBG: No results for input(s): GLUCAP in the last 168 hours. Lipid Profile: No results for input(s): CHOL, HDL, LDLCALC, TRIG, CHOLHDL, LDLDIRECT in the last 72 hours. Thyroid Function Tests: No results for input(s): TSH, T4TOTAL, FREET4, T3FREE, THYROIDAB in the last 72 hours. Anemia Panel: No results for input(s): VITAMINB12, FOLATE, FERRITIN, TIBC, IRON, RETICCTPCT in the last 72 hours. Urine analysis:    Component Value Date/Time   COLORURINE STRAW (A) 12/17/2017 1725   APPEARANCEUR CLEAR 12/17/2017 1725   LABSPEC 1.010 12/17/2017 1725   PHURINE 6.0 12/17/2017 1725   GLUCOSEU NEGATIVE 12/17/2017 1725   HGBUR MODERATE (A) 12/17/2017 1725   BILIRUBINUR NEGATIVE 12/17/2017 1725   KETONESUR NEGATIVE 12/17/2017 1725   PROTEINUR 30 (A) 12/17/2017 1725   NITRITE NEGATIVE 12/17/2017 1725   LEUKOCYTESUR TRACE (A) 12/17/2017 1725     Elmon Shader M.D. [facility] 12/20/2017, 1:09 PM  Pager: [phone] Between 7am to 7pm - call Pager - [phone]  After 7pm go to [email or web address] - password [email or web address]  Call night coverage person covering after 7pm

## 2017-12-20 NOTE — Consult Note (Signed)
[place] Nurse wound consult note Reason for Consult: neck and leg wound from biopsy Wound type: surgical s/p dermatologic skin biopsies x 2 per Dr. [place] Crane  Pressure Injury POA: NA Measurement: Right posterior leg wound; 1.5cm x 1.5cm x 0.5cm  Right posterior neck wound: 0.3cm x 0.3cm x 0.2cm  Wound bed: Right posterior leg; black centrally, discussed possible use of silver nitrate or cauterizing tool by dermatologist at the time of the bx which the patient did report he used this during the procedure. Pink at wound edges Right posterior neck; dry, pink Drainage (amount, consistency, odor) none from either site  Periwound: intact, some erythema noted at the posterior calf wound, marked today. Explained normal redness at the site to be expected 2-3 days after incision. If grows larger to contact dermatologist  Intact at neck site  Dressing procedure/placement/frequency: Will add silver hydrogel to the leg wound just for the antibacterial and moisture donation. With the reported new onset of growing erythema. Continue care of the neck wound as ordered by dermatologist.   Discussed POC with patient and bedside nurse.  Re consult if needed, will not follow at this time. Thanks  Fawnda Vitullo [employer], RN,CWOCN, CNS, [place] [phone])

## 2017-12-20 NOTE — Care Management Obs Status (Signed)
[place] NOTIFICATION   Patient Details  Name: Andrew [name] MRN: [id number] Date of Birth: [birth year]   Medicare Observation Status Notification Given:  Yes    Sharin Mons, RN 12/20/2017, 5:04 PM

## 2017-12-21 DIAGNOSIS — C9110 Chronic lymphocytic leukemia of B-cell type not having achieved remission: Principal | ICD-10-CM

## 2017-12-21 DIAGNOSIS — Z87891 Personal history of nicotine dependence: Secondary | ICD-10-CM

## 2017-12-21 DIAGNOSIS — N179 Acute kidney failure, unspecified: Secondary | ICD-10-CM

## 2017-12-21 DIAGNOSIS — Z809 Family history of malignant neoplasm, unspecified: Secondary | ICD-10-CM

## 2017-12-21 LAB — BASIC METABOLIC PANEL
ANION GAP: 9 (ref 5–15)
BUN: 64 mg/dL — ABNORMAL HIGH (ref 8–23)
CALCIUM: 9.6 mg/dL (ref 8.9–10.3)
CO2: 18 mmol/L — ABNORMAL LOW (ref 22–32)
Chloride: 110 mmol/L (ref 98–111)
Creatinine, Ser: 5.03 mg/dL — ABNORMAL HIGH (ref 0.61–1.24)
GFR, EST AFRICAN AMERICAN: 12 mL/min — AB (ref >60)
GFR, EST NON AFRICAN AMERICAN: 10 mL/min — AB (ref >60)
GLUCOSE: 214 mg/dL — AB (ref 70–99)
POTASSIUM: 4.5 mmol/L (ref 3.5–5.1)
Sodium: 137 mmol/L (ref 135–145)

## 2017-12-21 LAB — CBC
HEMATOCRIT: 31.1 % — AB (ref 39.0–52.0)
HEMOGLOBIN: 9.9 g/dL — AB (ref 13.0–17.0)
MCH: 30.5 pg (ref 26.0–34.0)
MCHC: 31.8 g/dL (ref 30.0–36.0)
MCV: 95.7 fL (ref 78.0–100.0)
Platelets: 81 10*3/uL — ABNORMAL LOW (ref 150–400)
RBC: 3.25 MIL/uL — AB (ref 4.22–5.81)
RDW: 13.7 % (ref 11.5–15.5)
WBC: 71.4 10*3/uL (ref 4.0–10.5)

## 2017-12-21 NOTE — Progress Notes (Signed)
PROGRESS NOTE    Andrew [name]  PXT:[id number] DOB: 1940-11-18 DOA: 12/17/2017 PCP: [name], MD  Brief Narrative:77 year old male with hypertension, CKD, atrial fibrillation, diagnosed with CLL, started chemotherapy on July 15 and since then having worsening renal failure.  Patient saw both his oncologist and nephrologist and creatinine was noted to be over 5 and was sent to ED.  Patient otherwise denied any nausea, vomiting or diarrhea, recent illness or increased use of NSAIDs.  Patient's creatinine was 3.9 on 12/03/2017 before the initiation of chemotherapy and 4.7 on the day of chemo was started on 7/15.  Patient reports he receives chemotherapy every 15 days, last chemo on 7/15.     Assessment & Plan:   Principal Problem:   AKI (acute kidney injury) ([place]) Active Problems:   CLL (chronic lymphocytic leukemia) ([place])   A-fib (HCC)  AKI (acute kidney injury) ([place]) superimposed on CKD stage III -Baseline 1.5-2.0, multifactorial, likely due to chemo, Micardis, dehydration -Renal ultrasound negative for any hydronephrosis or obstruction -Continue IV fluid hydration -Creatinine increased 5.03 Noted biopsy results possible CLL involvement of the kidney.  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  Oncology consult called in by renal. Active Problems:   CLL (chronic lymphocytic leukemia) (HCC)-white count is elevated at 71. -No fevers or infectious signs or symptoms   Cellulitis on the right lower extremity biopsy site -Placed on Keflex, wound care consulted  Paroxysmal A-fib (HCC) -History of remote DCCV 15 years ago, per neurosurgery not a candidate for at least 2 months for antiplatelets or anticoagulation given history of subdural hematoma (per cardiology note outpatient in 09/2017 by Dr. Harl Bowie)  History of subdural hematoma -Status post craniotomy with evacuation on 10/14/2016, followed by Dr. Sherwood Gambler  Hypertension BP stable, continue Coreg, amlodipine  Chronic  anemia of malignancy/CLL H&H stable, currently close to baseline.  Some component of hemodilution.   Baseline 11.0      DVT prophylaxis: SCD Code Status: Full code.  No family available Family Communication Disposition Plan: TBD  Consultants: Nephrology   Procedures: None Antimicrobials: Keflex  Subjective: Sitting up in his chair denies chest pain shortness of breath nausea vomiting. Objective: Vitals:   12/20/17 0908 12/20/17 2117 12/21/17 0611 12/21/17 0902  BP: 135/72 124/75 108/68 102/61  Pulse: 74 68 78 69  Resp: 18 16 20 18   Temp: 97.9 F (36.6 C) 98.2 F (36.8 C) 97.8 F (36.6 C) 97.7 F (36.5 C)  TempSrc: Oral Oral Oral Oral  SpO2: 96% 94% 97% 94%  Weight:  102.1 kg (225 lb 1.4 oz)    Height:        Intake/Output Summary (Last 24 hours) at 12/21/2017 1459 Last data filed at 12/21/2017 1339 Gross per 24 hour  Intake 3642.38 ml  Output 0 ml  Net 3642.38 ml   Filed Weights   12/18/17 0100 12/18/17 2152 12/20/17 2117  Weight: 102.1 kg (225 lb 1.4 oz) 102.1 kg (225 lb 1.4 oz) 102.1 kg (225 lb 1.4 oz)    Examination:  General exam: Appears calm and comfortable  Respiratory system: Clear to auscultation. Respiratory effort normal. Cardiovascular system: S1 & S2 heard, RRR. No JVD, murmurs, rubs, gallops or clicks. No pedal edema. Gastrointestinal system: Abdomen is nondistended, soft and nontender. No organomegaly or masses felt. Normal bowel sounds heard. Central nervous system: Alert and oriented. No focal neurological deficits. Extremities: Symmetric 5 x 5 power. Skin: No rashes, lesions or ulcers Psychiatry: Judgement and insight appear normal. Mood & affect appropriate.  Data Reviewed: I have personally reviewed following labs and imaging studies  CBC: Recent Labs  Lab 12/17/17 1144 12/17/17 1727 12/18/17 0510 12/19/17 0504 12/20/17 0433 12/21/17 0620  WBC 78.3* 89.8* 81.9* 71.1* 60.1* 71.4*  NEUTROABS 3.9 4.5  --   --   --   --     HGB 11.0* 10.9* 9.7* 9.4* 9.3* 9.9*  HCT 33.9* 34.9* 31.2* 29.9* 29.5* 31.1*  MCV 94.7 96.4 95.7 95.5 96.7 95.7  PLT 102* 112* 97* 87* 82* 81*   Basic Metabolic Panel: Recent Labs  Lab 12/17/17 1727 12/18/17 0510 12/19/17 0504 12/20/17 0433 12/21/17 0620  NA 136 138 139 140 137  K 5.8* 4.2 4.4 4.3 4.5  CL 105 109 111 111 110  CO2 21* 20* 20* 21* 18*  GLUCOSE 87 94 112* 122* 214*  BUN 66* 65* 64* 63* 64*  CREATININE 5.32* 4.93* 4.82* 4.80* 5.03*  CALCIUM 10.9* 9.8 9.6 9.6 9.6  PHOS  --  5.1*  --   --   --    GFR: Estimated Creatinine Clearance: 15.9 mL/min (A) (by C-G formula based on SCr of 5.03 mg/dL (H)). Liver Function Tests: Recent Labs  Lab 12/17/17 1144 12/17/17 1727  AST 16 29  ALT 14 15  ALKPHOS 69 72  BILITOT 1.1 1.1  PROT 6.7 6.5  ALBUMIN 4.2 4.2   No results for input(s): LIPASE, AMYLASE in the last 168 hours. No results for input(s): AMMONIA in the last 168 hours. Coagulation Profile: Recent Labs  Lab 12/19/17 0504  INR 1.20   Cardiac Enzymes: No results for input(s): CKTOTAL, CKMB, CKMBINDEX, TROPONINI in the last 168 hours. BNP (last 3 results) No results for input(s): PROBNP in the last 8760 hours. HbA1C: No results for input(s): HGBA1C in the last 72 hours. CBG: No results for input(s): GLUCAP in the last 168 hours. Lipid Profile: No results for input(s): CHOL, HDL, LDLCALC, TRIG, CHOLHDL, LDLDIRECT in the last 72 hours. Thyroid Function Tests: No results for input(s): TSH, T4TOTAL, FREET4, T3FREE, THYROIDAB in the last 72 hours. Anemia Panel: No results for input(s): VITAMINB12, FOLATE, FERRITIN, TIBC, IRON, RETICCTPCT in the last 72 hours. Sepsis Labs: No results for input(s): PROCALCITON, LATICACIDVEN in the last 168 hours.  Recent Results (from the past 240 hour(s))  MRSA PCR Screening     Status: Abnormal   Collection Time: 12/18/17  5:40 AM  Result Value Ref Range Status   MRSA by PCR POSITIVE (A) NEGATIVE Final    Comment:         The GeneXpert MRSA Assay (FDA approved for NASAL specimens only), is one component of a comprehensive MRSA colonization surveillance program. It is not intended to diagnose MRSA infection nor to guide or monitor treatment for MRSA infections. RESULT CALLED TO, READ BACK BY AND VERIFIED WITH: Lorre Munroe RN 12/18/17 0709 JDW Performed at [facility], [address], [place], [place] [address]   Culture, blood (routine x 2)     Status: None (Preliminary result)   Collection Time: 12/18/17  1:24 PM  Result Value Ref Range Status   Specimen Description BLOOD RIGHT ANTECUBITAL  Final   Special Requests   Final    BOTTLES DRAWN AEROBIC ONLY Blood Culture adequate volume   Culture   Final    NO GROWTH 3 DAYS Performed at [facility], [place] [address], [place], [place] [address]    Report Status PENDING  Incomplete  Culture, blood (routine x 2)  Status: None (Preliminary result)   Collection Time: 12/18/17  1:30 PM  Result Value Ref Range Status   Specimen Description BLOOD RIGHT HAND  Final   Special Requests   Final    BOTTLES DRAWN AEROBIC ONLY Blood Culture adequate volume   Culture   Final    NO GROWTH 3 DAYS Performed at [facility], [address] [place],  [address]    Report Status PENDING  Incomplete         Radiology Studies: No results found.      Scheduled Meds: . allopurinol  100 mg Oral Daily  . amLODipine  5 mg Oral Daily  . carvedilol  12.5 mg Oral BID WC  . cephALEXin  500 mg Oral Q12H  . Chlorhexidine Gluconate Cloth  6 each Topical Q0600  . multivitamin with minerals  1 tablet Oral Daily  . mupirocin ointment  1 application Nasal BID  . tamsulosin  0.4 mg Oral Daily   Continuous Infusions: . sodium chloride 100 mL/hr at 12/21/17 1150     LOS: 0 days     Georgette Shell, MD [facility]  If 7PM-7AM, please contact night-coverage [email or web address] Password [facility] 12/21/2017, 2:59 PM

## 2017-12-21 NOTE — Progress Notes (Addendum)
[name] ROUNDING NOTE   Subjective:   Interval History: has no complaint today. Stated that he continues to produce urine at regular rates and quantities. He was informed of the biopsy results and that Heme/Onc was consulted to evaluate the patient with consideration of these findings.  Objective:  Vital signs in last 24 hours:  Temp:  [97.8 F (36.6 C)-98.2 F (36.8 C)] 97.8 F (36.6 C) (07/26 0611) Pulse Rate:  [68-78] 78 (07/26 0611) Resp:  [16-20] 20 (07/26 0611) BP: (108-135)/(68-75) 108/68 (07/26 0611) SpO2:  [94 %-97 %] 97 % (07/26 0611) Weight:  [225 lb 1.4 oz (102.1 kg)] 225 lb 1.4 oz (102.1 kg) (07/25 2117)  Weight change:  Filed Weights   12/18/17 0100 12/18/17 2152 12/20/17 2117  Weight: 225 lb 1.4 oz (102.1 kg) 225 lb 1.4 oz (102.1 kg) 225 lb 1.4 oz (102.1 kg)    Intake/Output: I/O last 3 completed shifts: In: 5805.2 [P.O.:1080; I.V.:4673.2; IV Piggyback:52] Out: 0    Intake/Output this shift:  No intake/output data recorded.  Physical Exam: CVS- RRR RS- CTA ABD- BS present soft non-distended EXT- no edema   Basic Metabolic Panel: Recent Labs  Lab 12/17/17 1144 12/17/17 1727 12/18/17 0510 12/19/17 0504 12/20/17 0433  NA 138 136 138 139 140  K 4.4 5.8* 4.2 4.4 4.3  CL 107 105 109 111 111  CO2 21* 21* 20* 20* 21*  GLUCOSE 96 87 94 112* 122*  BUN 67* 66* 65* 64* 63*  CREATININE 5.22* 5.32* 4.93* 4.82* 4.80*  CALCIUM 10.8* 10.9* 9.8 9.6 9.6  PHOS  --   --  5.1*  --   --     Liver Function Tests: Recent Labs  Lab 12/17/17 1144 12/17/17 1727  AST 16 29  ALT 14 15  ALKPHOS 69 72  BILITOT 1.1 1.1  PROT 6.7 6.5  ALBUMIN 4.2 4.2   No results for input(s): LIPASE, AMYLASE in the last 168 hours. No results for input(s): AMMONIA in the last 168 hours.  CBC: Recent Labs  Lab 12/17/17 1144 12/17/17 1727 12/18/17 0510 12/19/17 0504 12/20/17 0433  WBC 78.3* 89.8* 81.9* 71.1* 60.1*  NEUTROABS 3.9 4.5  --   --   --   HGB  11.0* 10.9* 9.7* 9.4* 9.3*  HCT 33.9* 34.9* 31.2* 29.9* 29.5*  MCV 94.7 96.4 95.7 95.5 96.7  PLT 102* 112* 97* 87* 82*    Cardiac Enzymes: No results for input(s): CKTOTAL, CKMB, CKMBINDEX, TROPONINI in the last 168 hours.  BNP: Invalid input(s): POCBNP  CBG: No results for input(s): GLUCAP in the last 168 hours.  Microbiology: Results for orders placed or performed during the hospital encounter of 12/17/17  MRSA PCR Screening     Status: Abnormal   Collection Time: 12/18/17  5:40 AM  Result Value Ref Range Status   MRSA by PCR POSITIVE (A) NEGATIVE Final    Comment:        The GeneXpert MRSA Assay (FDA approved for NASAL specimens only), is one component of a comprehensive MRSA colonization surveillance program. It is not intended to diagnose MRSA infection nor to guide or monitor treatment for MRSA infections. RESULT CALLED TO, READ BACK BY AND VERIFIED WITH: Lorre Munroe RN 12/18/17 0709 JDW Performed at [facility], [address] [place], [place] [address]   Culture, blood (routine x 2)     Status: None (Preliminary result)   Collection Time: 12/18/17  1:24 PM  Result Value Ref Range Status   Specimen  Description BLOOD RIGHT ANTECUBITAL  Final   Special Requests   Final    BOTTLES DRAWN AEROBIC ONLY Blood Culture adequate volume   Culture   Final    NO GROWTH 2 DAYS Performed at [facility], [address] [place], [place] [address]    Report Status PENDING  Incomplete  Culture, blood (routine x 2)     Status: None (Preliminary result)   Collection Time: 12/18/17  1:30 PM  Result Value Ref Range Status   Specimen Description BLOOD RIGHT HAND  Final   Special Requests   Final    BOTTLES DRAWN AEROBIC ONLY Blood Culture adequate volume   Culture   Final    NO GROWTH 2 DAYS Performed at [facility], [place] [address], [place], [place] [address]    Report Status PENDING  Incomplete    Coagulation Studies: Recent Labs    12/19/17 0504   LABPROT 15.1  INR 1.20    Urinalysis: No results for input(s): COLORURINE, LABSPEC, PHURINE, GLUCOSEU, HGBUR, BILIRUBINUR, KETONESUR, PROTEINUR, UROBILINOGEN, NITRITE, LEUKOCYTESUR in the last 72 hours.  Invalid input(s): APPERANCEUR    Imaging: US Biopsy (kidney)  Result Date: 12/19/2017 INDICATION: Acute on chronic renal disease. Please perform random renal biopsy for tissue diagnostic purposes. EXAM: ULTRASOUND GUIDED RENAL BIOPSY COMPARISON:  Renal ultrasound - 12/17/2017; PET-CT - 11/15/2017 MEDICATIONS: None. ANESTHESIA/SEDATION: Fentanyl 50 mcg IV; Versed 2 mg IV Total Moderate Sedation time: 10 minutes; The patient was continuously monitored during the procedure by the interventional radiology nurse under my direct supervision. COMPLICATIONS: None immediate. PROCEDURE: Informed written consent was obtained from the patient after a discussion of the risks, benefits and alternatives to treatment. The patient understands and consents the procedure. A timeout was performed prior to the initiation of the procedure. Ultrasound scanning was performed of the bilateral flanks. The inferior pole of the right kidney was selected for biopsy due to location and sonographic window. The procedure was planned. The operative site was prepped and draped in the usual sterile fashion. The overlying soft tissues were anesthetized with 1% lidocaine with epinephrine. A 17 gauge core needle biopsy device was advanced into the inferior cortex of the right kidney and 2 core biopsies were obtained under direct ultrasound guidance. Images were saved for documentation purposes. The biopsy device was removed and hemostasis was obtained with manual compression. Post procedural scanning was negative for significant post procedural hemorrhage or additional complication. A dressing was placed. The patient tolerated the procedure well without immediate post procedural complication. IMPRESSION: Technically successful ultrasound  guided right renal biopsy. Electronically Signed   By: Sandi Mariscal M.D.   On: 12/19/2017 11:13    Medications:   . sodium chloride 100 mL/hr at 12/21/17 0612  . methylPREDNISolone (SOLU-MEDROL) injection 250 mg (12/20/17 1840)   . allopurinol  100 mg Oral Daily  . amLODipine  5 mg Oral Daily  . carvedilol  12.5 mg Oral BID WC  . cephALEXin  500 mg Oral Q12H  . Chlorhexidine Gluconate Cloth  6 each Topical Q0600  . multivitamin with minerals  1 tablet Oral Daily  . mupirocin ointment  1 application Nasal BID  . tamsulosin  0.4 mg Oral Daily   neomycin-bacitracin-polymyxin   Patient Profile:Andrew J Nielsenis a 77 y.o.malew/ a PMHx notable for HTN, rapidly progressively worsening CKD, A-fib, and CLL started on chemotherapy on July 15th with worsening renal function. He was seen by his oncologist and nephrologist on the day of admission when his  renal function was noted to have increased from 4.76 to 5.22-32 prompting him to be sent to the ED for evaluation. Renal US failed to demonstrate an obstructive etiology. Aside from initiating chemotherapy he has not altered his medication regimen or increased/initiated NSAID use. He was admitted for and acute on chronic renal failure.The acute nature resolved but a renal biopsy was obtained to evaluate the etiology of the chronic failure.   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 Given the rarity of CLL induced renal injury and predominance of CLL nests on asymptomatic post mortuum renal biopsy's I am not certain the CLL is related to the renal injury but without an alternative etiology we feel this warrants further evaluation and have thus consulted Hem/Onc.  --Discontinue steroids, patient has received two high dose treatments --Continue to monitor. LDH and uric acid not grossly abnormal or consistent with tumor lysis syndrome. No evidence of  medication or dye induce nephropathy. Nor is there a clear indication of post/prerenal etiology with regard to the more subacute nature of the renal failure.  --Continue fluids 2. Hypertension/volume -BP stable but soft. Agree with Amlodipine 10mg , agree with holding ARB and any nephrotoxic agents. Agree with gentle rehydration.  -Daily BMP's recommended 3. CLL -Consult to Hematology/Oncology Placed this AM at ~8:20. Appreciate their assistance. I feel that given the patients drop in Hgb and thrombocytopenia that treatment of the CLL would benefit. It is unlikely that such treatment would improve his renal function but it warrants thought. As per chart review it appears that the patient was treated with imbruvica started on 08/19/2016 but developed a traumatic brain bleed prompting it to be discontinued in 09/2016. There did appear to be a modest benefit with regard to his WBC count though.  4. Anemia -Normocytic, Hgb stable. ESA not currently recommended.  5. Cellulitis secondary to a dermatological biopsy: As per primary. Agree with antibiotics.  Will discontinue steroids at this point as the high dose steroids may be a risk for severe immunosuppression in setting of CLL. We shall await the input from Oncology to see if there are additional treatment options for the CLL   LOS: 0 Kathi Ludwig, MD Internal Medicine PGY-2

## 2017-12-21 NOTE — Consult Note (Signed)
Reason for the consult: CLL  HPI: 77 year old gentleman currently of [place] where currently resides and gets his routine care.  He was diagnosed with CLL in 2016 after presenting with lymphocytosis and thrombocytopenia.  He underwent observation and surveillance for a period of time and was started on Imbruvica in 2018.  Treatment was discontinued after he developed a subdural hematoma that required evacuation in May 2018.  He remained without any treatment until July 2019 after presenting with worsening lymphadenopathy as well as a leukocytosis.  At that time he was evaluated by Dr. Walden Field and was started on chlorambucil at 0.5 g/kg every 2 weeks.  He received the first oral treatment on July 16.  At that time his white cell count was up to 93,000 with hemoglobin of 11.2 and a platelet count of 93.  Subsequent to that, he did notice improvement in his neck adenopathy as well as slight decrease in his white cell count.  He was noticed to have a worsening renal failure with a creatinine was up to 3.1 in June 2019 and 3.92 on July 8.  His creatinine was up to 5.22 on 12/17/2017 when he was hospitalized at that time.  Kidney biopsy was obtained to determine etiology of his renal failure the final reports is not available but appears to indicate CLL infiltration of the renal parenchyma.  Clinically, [name] feels reasonably well.  Reports good urine output without any dysuria or hematuria.  He does not report any abdominal distention or early satiety.  He does report improvement in his neck adenopathy.  He does not report any headaches, blurry vision, syncope or seizures. Does not report any fevers, chills or sweats.  Does not report any cough, wheezing or hemoptysis.  Does not report any chest pain, palpitation, orthopnea.  Does report some mild edema..  Does not report any nausea, vomiting or abdominal pain.  Does not report any constipation or diarrhea.  Does not report any skeletal  complaints.    Does not report frequency, urgency or hematuria.  Does not report any skin rashes or lesions. Does not report any heat or cold intolerance.  Does not report any lymphadenopathy or petechiae.  Does not report any anxiety or depression.  Remaining review of systems is negative.    Past Medical History:  Diagnosis Date  . Atrial fibrillation ([place])   . Chronic kidney disease    stage 3  . Chronic lymphocytic leukemia ([place])   . CLL (chronic lymphocytic leukemia) ([place])   . Dysrhythmia    Hx. of Atrial Fibrillation- has resolved  . Hypertension   . Pinched nerve in neck   :  Past Surgical History:  Procedure Laterality Date  . CATARACT EXTRACTION, BILATERAL    . COLONOSCOPY  07/10/2011   Procedure: COLONOSCOPY;  Surgeon: Dorothyann Peng, MD;  Location[facility];  Service: Endoscopy;  Laterality: N/A;  10:30 AM  . CRANIOTOMY N/A 10/14/2016   Procedure: CRANIOTOMY HEMATOMA EVACUATION SUBDURAL;  Surgeon: Jovita Gamma, MD;  Location: [facility];  Service: Neurosurgery;  Laterality: N/A;  CRANIOTOMY HEMATOMA EVACUATION SUBDURAL  . THYROIDECTOMY, PARTIAL    . TONSILLECTOMY    :   Current Facility-Administered Medications:  .  0.9 %  sodium chloride infusion, , Intravenous, Continuous, Derrill Kay A, MD, Last Rate: 100 mL/hr at 12/21/17 1558 .  allopurinol (ZYLOPRIM) tablet 100 mg, 100 mg, Oral, Daily, Derrill Kay A, MD, 100 mg at 12/21/17 0939 .  amLODipine (NORVASC) tablet 5 mg, 5  mg, Oral, Daily, Derrill Kay A, MD, 5 mg at 12/21/17 0940 .  carvedilol (COREG) tablet 12.5 mg, 12.5 mg, Oral, BID WC, Derrill Kay A, MD, 12.5 mg at 12/21/17 1558 .  cephALEXin (KEFLEX) capsule 500 mg, 500 mg, Oral, Q12H, Rai, Ripudeep K, MD, 500 mg at 12/21/17 0940 .  Chlorhexidine Gluconate Cloth 2 % PADS 6 each, 6 each, Topical, Q0600, Rai, Ripudeep K, MD, 6 each at 12/21/17 0611 .  multivitamin with minerals tablet 1 tablet, 1 tablet, Oral, Daily, Phillips Grout, MD, 1 tablet at 12/21/17  0939 .  mupirocin ointment (BACTROBAN) 2 % 1 application, 1 application, Nasal, BID, Rai, Ripudeep K, MD, 1 application at 86/76/19 0939 .  neomycin-bacitracin-polymyxin (NEOSPORIN) ointment 1 application, 1 application, Topical, PRN, Mauricia Area, MD, 1 application at 50/93/26 1651 .  tamsulosin (FLOMAX) capsule 0.4 mg, 0.4 mg, Oral, Daily, Derrill Kay A, MD, 0.4 mg at 12/21/17 7124:  No Known Allergies:  Family History  Problem Relation Age of Onset  . CAD Father 30  . Cancer Mother   . Alzheimer's disease Mother   . Colon cancer Neg Hx   :  Social History   Socioeconomic History  . Marital status: Married    Spouse name: Not on file  . Number of children: Not on file  . Years of education: Not on file  . Highest education level: Not on file  Occupational History  . Not on file  Social Needs  . Financial resource strain: Not on file  . Food insecurity:    Worry: Not on file    Inability: Not on file  . Transportation needs:    Medical: Not on file    Non-medical: Not on file  Tobacco Use  . Smoking status: Former Smoker    Packs/day: 1.00    Years: 15.00    Pack years: 15.00    Last attempt to quit: 01/26/1971    Years since quitting: 46.9  . Smokeless tobacco: Never Used  Substance and Sexual Activity  . Alcohol use: No  . Drug use: No  . Sexual activity: Not on file  Lifestyle  . Physical activity:    Days per week: Not on file    Minutes per session: Not on file  . Stress: Not on file  Relationships  . Social connections:    Talks on phone: Not on file    Gets together: Not on file    Attends religious service: Not on file    Active member of club or organization: Not on file    Attends meetings of clubs or organizations: Not on file    Relationship status: Not on file  . Intimate partner violence:    Fear of current or ex partner: Not on file    Emotionally abused: Not on file    Physically abused: Not on file    Forced sexual activity: Not on  file  Other Topics Concern  . Not on file  Social History Narrative  . Not on file  :  Pertinent items are noted in HPI.  Exam: Blood pressure 102/61, pulse 69, temperature 97.7 F (36.5 C), temperature source Oral, resp. rate 18, height 6\' 3"  (1.905 m), weight 225 lb 1.4 oz (102.1 kg), SpO2 94 %. General appearance: Well-appearing gentleman without distress. Head: atraumatic without any abnormalities. Eyes: conjunctivae/corneas clear. PERRL.  Sclera anicteric. Throat: lips, mucosa, and tongue normal; without oral thrush or ulcers. Resp: clear to auscultation bilaterally without rhonchi, wheezes or dullness  to percussion. Cardio: regular rate and rhythm, S1, S2 trace edema noted at the ankles. GI: soft, non-tender; bowel sounds normal; no masses,  no organomegaly Skin: Skin color, texture, turgor normal. No rashes or lesions Lymph nodes: Cervical adenopathy palpated no supraclavicular axillary or inguinal nodes noted. Neurologic: Grossly normal without any motor, sensory or deep tendon reflexes. Musculoskeletal: No joint deformity or effusion.  Recent Labs    12/20/17 0433 12/21/17 0620  WBC 60.1* 71.4*  HGB 9.3* 9.9*  HCT 29.5* 31.1*  PLT 82* 81*   Recent Labs    12/20/17 0433 12/21/17 0620  NA 140 137  K 4.3 4.5  CL 111 110  CO2 21* 18*  GLUCOSE 122* 214*  BUN 63* 64*  CREATININE 4.80* 5.03*  CALCIUM 9.6 9.6       Assessment and Plan:   77 year old man with the following:  1.  CLL diagnosed in 2016 and received his first treatment in 2018 after period of observation.  He received to improve account that was discontinued because of subdural bleed.  He is currently on chlorambucil and has received 1 dose on December 11, 2017 and scheduled to have a repeat dose orally on December 25, 2017.  The natural course of this disease was discussed today with the patient including review of his medical records, imaging studies and treatment options.  He appears to be on  appropriate treatment for this disease at this time.  He has deferred adding monoclonal antibody intravenously to a later date.  This therapy can be added in the future especially if his renal function does not improve or continues to deteriorate.  I do not recommend any additional therapy at this time.  I do not recommend change of his treatment regimen based on the current data given his reasonable clinical response as well as decline in his white cell count.  2.  Renal failure: Appears to be related to CLL infiltration of the kidney.  The final pathology report is not available to me but it appears that his renal dysfunction is related to intrinsic renal involvement with CLL.  This manifestation can very with MPGN, amyloidosis among others.  Current management standpoint, I advocated treating CLL which he is already receiving.  I agree with discontinuation of steroids as it has very little value in treating CLL.  I do not feel strongly about adding rituximab at this point in the acute hospital in setting but will consider it in the future if needed.  3.  Disposition: I have no objections to discharge if he is considered stable from a kidney function standpoint to continue to resume his CLL treatment as an outpatient.  Please call with any questions regarding this pleasant gentleman.  He will follow-up with Dr. Walden Field regarding his CLL care.    80  minutes was spent with the patient face-to-face today.  More than 50% of time was dedicated to patient counseling, education and discussing his disease process as well as alternative treatment options.

## 2017-12-22 LAB — CBC
HEMATOCRIT: 28.8 % — AB (ref 39.0–52.0)
HEMOGLOBIN: 9.2 g/dL — AB (ref 13.0–17.0)
MCH: 30.8 pg (ref 26.0–34.0)
MCHC: 31.9 g/dL (ref 30.0–36.0)
MCV: 96.3 fL (ref 78.0–100.0)
Platelets: 76 10*3/uL — ABNORMAL LOW (ref 150–400)
RBC: 2.99 MIL/uL — ABNORMAL LOW (ref 4.22–5.81)
RDW: 14 % (ref 11.5–15.5)
WBC: 68.8 10*3/uL (ref 4.0–10.5)

## 2017-12-22 LAB — BASIC METABOLIC PANEL
ANION GAP: 11 (ref 5–15)
BUN: 73 mg/dL — ABNORMAL HIGH (ref 8–23)
CHLORIDE: 109 mmol/L (ref 98–111)
CO2: 16 mmol/L — AB (ref 22–32)
Calcium: 9.4 mg/dL (ref 8.9–10.3)
Creatinine, Ser: 4.89 mg/dL — ABNORMAL HIGH (ref 0.61–1.24)
GFR calc non Af Amer: 10 mL/min — ABNORMAL LOW (ref >60)
GFR, EST AFRICAN AMERICAN: 12 mL/min — AB (ref >60)
Glucose, Bld: 170 mg/dL — ABNORMAL HIGH (ref 70–99)
POTASSIUM: 4.5 mmol/L (ref 3.5–5.1)
Sodium: 136 mmol/L (ref 135–145)

## 2017-12-22 MED ORDER — SODIUM BICARBONATE 650 MG PO TABS
650.0000 mg | ORAL_TABLET | Freq: Two times a day (BID) | ORAL | Status: DC
  Administered 2017-12-22: 650 mg via ORAL
  Filled 2017-12-22: qty 1

## 2017-12-22 MED ORDER — CEPHALEXIN 500 MG PO CAPS: 500.0000 mg | ORAL_CAPSULE | Freq: Two times a day (BID) | ORAL | 0 refills | Status: DC

## 2017-12-22 MED ORDER — SODIUM BICARBONATE 650 MG PO TABS: 650.0000 mg | ORAL_TABLET | Freq: Two times a day (BID) | ORAL | 0 refills | Status: DC

## 2017-12-22 MED ORDER — BACITRACIN-NEOMYCIN-POLYMYXIN OINTMENT TUBE: 1.0000 "application " | TOPICAL_OINTMENT | CUTANEOUS | 0 refills | Status: DC | PRN

## 2017-12-22 NOTE — Discharge Summary (Signed)
Physician Discharge Summary  CORTLIN MARANO FWY:[id number] DOB: April 30, 1941 DOA: 12/17/2017  PCP: [name], MD  Admit date: 12/17/2017 Discharge date: 12/22/2017  Admitted From: Home Disposition: Home Recommendations for Outpatient Follow-up:  1. Follow up with PCP in 1-2 weeks 2. Please obtain BMP/CBC in one week 3. Follow-up with [facility] in 1 to 2 weeks 4. Follow-up with vascular surgeon Dr. early in 1 to 2 weeks 5. Follow-up with oncology Dr. Darnelle Catalan in [place]: None Equipment/Devices none  Discharge Condition stable CODE STATUS: Full code DIET RENAL  Brief/Interim Summary:77 year old male with hypertension, CKD, atrial fibrillation, diagnosed with CLL, started chemotherapy on July 15 and since then having worsening renal failure. Patient saw both his oncologist and nephrologist and creatinine was noted to be over 5 and was sent to ED. Patient otherwise denied any nausea, vomiting or diarrhea, recent illness or increased use of NSAIDs. Patient's creatinine was 3.9 on 12/03/2017 before the initiation of chemotherapy and 4.7 on the day of chemo was started on 7/15. Patient reports he receives chemotherapy every 15 days, last chemo on 7/15.    Discharge Diagnoses:  Principal Problem:   AKI (acute kidney injury) ([place]) Active Problems:   CLL (chronic lymphocytic leukemia) ([place])   A-fib (HCC)  AKI (acute kidney injury) (HCC)superimposed on CKD stage III -Baseline 1.5-2.0,multifactorial, likely due to chemo, Micardis, dehydration -Renal ultrasound negative for any hydronephrosis or obstruction -Continue IV fluid hydration -Creatinine on discharge 4.89 which is down from 5.03 yesterday. Noted biopsy results possible CLL involvement of the kidney.  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  Oncology Dr. Roxy Cedar saw the patient.  His conclusion was this patient's renal failure does appear to be related to CLL infiltration of the kidney.  Active  Problems: CLL (chronic lymphocytic leukemia) (HCC)-white count is elevated at 71.  Patient currently on chlorambucil.  She had his first dose on July 16 and the second dose will be on July 30 of 2019.  Patient has refused adding monoclonal antibody at this time.  However he will consider it later if his renal functions does not improve.  Patient will follow-up with his own oncologist and read through Dr. Walden Field.   Cellulitis on the right lower extremity biopsy site -Placed on Keflex continue for 5 more days.  ParoxysmalA-fib ([place]) -History of remote DCCV 15 years ago, per neurosurgery not a candidate for at least 2 months for antiplatelets or anticoagulation given history of subdural hematoma (per cardiology note outpatient in 09/2017 by Dr. Harl Bowie)  History of subdural hematoma -Status post craniotomy with evacuation on 10/14/2016, followed by Dr. Sherwood Gambler  Hypertension BP stable, continue Coreg, amlodipine   Chronic anemia of malignancy/CLL H&H stable, currently close to baseline. Some component of hemodilution.  Baseline 11.0    Discharge Instructions   Allergies as of 12/22/2017   No Known Allergies     Medication List    STOP taking these medications   allopurinol 100 MG tablet Commonly known as:  ZYLOPRIM   amLODipine 5 MG tablet Commonly known as:  NORVASC   carvedilol 12.5 MG tablet Commonly known as:  COREG   telmisartan 80 MG tablet Commonly known as:  MICARDIS     TAKE these medications   cephALEXin 500 MG capsule Commonly known as:  KEFLEX Take 1 capsule (500 mg total) by mouth every 12 (twelve) hours.   chlorambucil 2 MG tablet Commonly known as:  LEUKERAN Take 20 tablets (40mg  total) on Day 1 and Day  15. Give on an empty stomach 1 hour before or 2 hours after meals.   CoQ-10 200 MG Caps Take 400 mg by mouth every evening.   multivitamin with minerals Tabs tablet Take 1 tablet by mouth daily.   neomycin-bacitracin-polymyxin  Oint Commonly known as:  NEOSPORIN Apply 1 application topically as needed for wound care.   sodium bicarbonate 650 MG tablet Take 1 tablet (650 mg total) by mouth 2 (two) times daily.   tamsulosin 0.4 MG Caps capsule Commonly known as:  FLOMAX Take 0.4 mg by mouth daily.      Follow-up Information    Edrick Oh, MD Follow up.   Specialty:  Nephrology Contact information: [place] [address] [phone]        Rosetta Posner, MD Follow up.   Specialties:  Vascular Surgery, Cardiology Contact information: [address] [place] [address] [phone]        [name], MD Follow up.   Specialty:  Internal Medicine Contact information: [address] [facility] [address] [phone]        [name], MD .   Specialty:  Cardiology Contact information: [address] [place] [address] [phone]        Zoila Shutter, MD Follow up.   Specialty:  Oncology Contact information: [place] [address] [phone]          No Known Allergies  Consultations:  RENAL AND ONCOLOGY   Procedures/Studies: US Renal  Result Date: 12/17/2017 CLINICAL DATA:  Elevated creatinine EXAM: RENAL / URINARY TRACT ULTRASOUND COMPLETE COMPARISON:  PET CT 11/15/2017 FINDINGS: Right Kidney: Length: 12.7 cm. Echogenicity within normal limits. No mass or hydronephrosis visualized. Left Kidney: Length: 12.5 cm. Echogenicity within normal limits. No mass or hydronephrosis visualized. Bladder: Appears normal for degree of bladder distention. Incidentally noted are bilateral pelvic masses adjacent to the bladder, measuring 6.5 x 3.3 x 3.2 cm on the right and 4 x 2.1 x 3 cm on the left. IMPRESSION: 1. Negative for hydronephrosis 2. Bilateral pelvic masses adjacent to the bladder, presumably representing enlarged nodes/lymphadenopathy noted on comparison PET-CT. Electronically Signed   By: Donavan Foil M.D.   On: 12/17/2017 23:08    US Biopsy (kidney)  Result Date: 12/19/2017 INDICATION: Acute on chronic renal disease. Please perform random renal biopsy for tissue diagnostic purposes. EXAM: ULTRASOUND GUIDED RENAL BIOPSY COMPARISON:  Renal ultrasound - 12/17/2017; PET-CT - 11/15/2017 MEDICATIONS: None. ANESTHESIA/SEDATION: Fentanyl 50 mcg IV; Versed 2 mg IV Total Moderate Sedation time: 10 minutes; The patient was continuously monitored during the procedure by the interventional radiology nurse under my direct supervision. COMPLICATIONS: None immediate. PROCEDURE: Informed written consent was obtained from the patient after a discussion of the risks, benefits and alternatives to treatment. The patient understands and consents the procedure. A timeout was performed prior to the initiation of the procedure. Ultrasound scanning was performed of the bilateral flanks. The inferior pole of the right kidney was selected for biopsy due to location and sonographic window. The procedure was planned. The operative site was prepped and draped in the usual sterile fashion. The overlying soft tissues were anesthetized with 1% lidocaine with epinephrine. A 17 gauge core needle biopsy device was advanced into the inferior cortex of the right kidney and 2 core biopsies were obtained under direct ultrasound guidance. Images were saved for documentation purposes. The biopsy device was removed and hemostasis was obtained with manual compression. Post procedural scanning was negative  for significant post procedural hemorrhage or additional complication. A dressing was placed. The patient tolerated the procedure well without immediate post procedural complication. IMPRESSION: Technically successful ultrasound guided right renal biopsy. Electronically Signed   By: Sandi Mariscal M.D.   On: 12/19/2017 11:13    (Echo, Carotid, EGD, Colonoscopy, ERCP)    Subjective:   Discharge Exam: Vitals:   12/22/17 0536 12/22/17 1150  BP: 118/84 122/68  Pulse: 71 (!)  53  Resp: 20 18  Temp: 97.7 F (36.5 C)   SpO2: 96% 96%   Vitals:   12/21/17 1634 12/21/17 2036 12/22/17 0536 12/22/17 1150  BP: 109/74 115/74 118/84 122/68  Pulse: 70 (!) 56 71 (!) 53  Resp: 18 20 20 18   Temp: 97.6 F (36.4 C) (!) 97.5 F (36.4 C) 97.7 F (36.5 C)   TempSrc: Oral Oral Oral   SpO2: 97% 98% 96% 96%  Weight:      Height:        General: Pt is alert, awake, not in acute distress Cardiovascular: RRR, S1/S2 +, no rubs, no gallops Respiratory: CTA bilaterally, no wheezing, no rhonchi Abdominal: Soft, NT, ND, bowel sounds + Extremities: no edema, no cyanosis    The results of significant diagnostics from this hospitalization (including imaging, microbiology, ancillary and laboratory) are listed below for reference.     Microbiology: Recent Results (from the past 240 hour(s))  MRSA PCR Screening     Status: Abnormal   Collection Time: 12/18/17  5:40 AM  Result Value Ref Range Status   MRSA by PCR POSITIVE (A) NEGATIVE Final    Comment:        The GeneXpert MRSA Assay (FDA approved for NASAL specimens only), is one component of a comprehensive MRSA colonization surveillance program. It is not intended to diagnose MRSA infection nor to guide or monitor treatment for MRSA infections. RESULT CALLED TO, READ BACK BY AND VERIFIED WITH: Lorre Munroe RN 12/18/17 0709 JDW Performed at [facility], [address] [place], [place] [address]   Culture, blood (routine x 2)     Status: None (Preliminary result)   Collection Time: 12/18/17  1:24 PM  Result Value Ref Range Status   Specimen Description BLOOD RIGHT ANTECUBITAL  Final   Special Requests   Final    BOTTLES DRAWN AEROBIC ONLY Blood Culture adequate volume   Culture   Final    NO GROWTH 4 DAYS Performed at [facility], [place] [address], [place],  [address]    Report Status PENDING  Incomplete  Culture, blood (routine x 2)     Status: None (Preliminary result)   Collection Time:  12/18/17  1:30 PM  Result Value Ref Range Status   Specimen Description BLOOD RIGHT HAND  Final   Special Requests   Final    BOTTLES DRAWN AEROBIC ONLY Blood Culture adequate volume   Culture   Final    NO GROWTH 4 DAYS Performed at [facility], [place] [address], [place],  [address]    Report Status PENDING  Incomplete     Labs: BNP (last 3 results) No results for input(s): BNP in the last 8760 hours. Basic Metabolic Panel: Recent Labs  Lab 12/18/17 0510 12/19/17 0504 12/20/17 0433 12/21/17 0620 12/22/17 0611  NA 138 139 140 137 136  K 4.2 4.4 4.3 4.5 4.5  CL 109 111 111 110 109  CO2 20* 20* 21* 18* 16*  GLUCOSE 94 112* 122* 214* 170*  BUN 65* 64* 63* 64* 73*  CREATININE 4.93* 4.82* 4.80* 5.03* 4.89*  CALCIUM 9.8 9.6 9.6 9.6 9.4  PHOS 5.1*  --   --   --   --    Liver Function Tests: Recent Labs  Lab 12/17/17 1144 12/17/17 1727  AST 16 29  ALT 14 15  ALKPHOS 69 72  BILITOT 1.1 1.1  PROT 6.7 6.5  ALBUMIN 4.2 4.2   No results for input(s): LIPASE, AMYLASE in the last 168 hours. No results for input(s): AMMONIA in the last 168 hours. CBC: Recent Labs  Lab 12/17/17 1144 12/17/17 1727 12/18/17 0510 12/19/17 0504 12/20/17 0433 12/21/17 0620 12/22/17 0611  WBC 78.3* 89.8* 81.9* 71.1* 60.1* 71.4* 68.8*  NEUTROABS 3.9 4.5  --   --   --   --   --   HGB 11.0* 10.9* 9.7* 9.4* 9.3* 9.9* 9.2*  HCT 33.9* 34.9* 31.2* 29.9* 29.5* 31.1* 28.8*  MCV 94.7 96.4 95.7 95.5 96.7 95.7 96.3  PLT 102* 112* 97* 87* 82* 81* 76*   Cardiac Enzymes: No results for input(s): CKTOTAL, CKMB, CKMBINDEX, TROPONINI in the last 168 hours. BNP: Invalid input(s): POCBNP CBG: No results for input(s): GLUCAP in the last 168 hours. D-Dimer No results for input(s): DDIMER in the last 72 hours. Hgb A1c No results for input(s): HGBA1C in the last 72 hours. Lipid Profile No results for input(s): CHOL, HDL, LDLCALC, TRIG, CHOLHDL, LDLDIRECT in the last 72 hours. Thyroid function  studies No results for input(s): TSH, T4TOTAL, T3FREE, THYROIDAB in the last 72 hours.  Invalid input(s): FREET3 Anemia work up No results for input(s): VITAMINB12, FOLATE, FERRITIN, TIBC, IRON, RETICCTPCT in the last 72 hours. Urinalysis    Component Value Date/Time   COLORURINE STRAW (A) 12/17/2017 1725   APPEARANCEUR CLEAR 12/17/2017 1725   LABSPEC 1.010 12/17/2017 1725   PHURINE 6.0 12/17/2017 1725   GLUCOSEU NEGATIVE 12/17/2017 1725   HGBUR MODERATE (A) 12/17/2017 1725   BILIRUBINUR NEGATIVE 12/17/2017 1725   KETONESUR NEGATIVE 12/17/2017 1725   PROTEINUR 30 (A) 12/17/2017 1725   NITRITE NEGATIVE 12/17/2017 1725   LEUKOCYTESUR TRACE (A) 12/17/2017 1725   Sepsis Labs Invalid input(s): PROCALCITONIN,  WBC,  LACTICIDVEN Microbiology Recent Results (from the past 240 hour(s))  MRSA PCR Screening     Status: Abnormal   Collection Time: 12/18/17  5:40 AM  Result Value Ref Range Status   MRSA by PCR POSITIVE (A) NEGATIVE Final    Comment:        The GeneXpert MRSA Assay (FDA approved for NASAL specimens only), is one component of a comprehensive MRSA colonization surveillance program. It is not intended to diagnose MRSA infection nor to guide or monitor treatment for MRSA infections. RESULT CALLED TO, READ BACK BY AND VERIFIED WITH: Lorre Munroe RN 12/18/17 0709 JDW Performed at [facility], [address] [place], [place] [address]   Culture, blood (routine x 2)     Status: None (Preliminary result)   Collection Time: 12/18/17  1:24 PM  Result Value Ref Range Status   Specimen Description BLOOD RIGHT ANTECUBITAL  Final   Special Requests   Final    BOTTLES DRAWN AEROBIC ONLY Blood Culture adequate volume   Culture   Final    NO GROWTH 4 DAYS Performed at [facility], [place] [address] [place], [place] [address]    Report Status PENDING  Incomplete  Culture, blood (routine x 2)     Status: None (Preliminary result)  Collection Time: 12/18/17  1:30 PM   Result Value Ref Range Status   Specimen Description BLOOD RIGHT HAND  Final   Special Requests   Final    BOTTLES DRAWN AEROBIC ONLY Blood Culture adequate volume   Culture   Final    NO GROWTH 4 DAYS Performed at [facility], [place] [address] [place], [place] [address]    Report Status PENDING  Incomplete     Time coordinating discharge: 40 minutes  SIGNED:   Georgette Shell, MD  [facility] 12/22/2017, 11:57 AM Pager   If 7PM-7AM, please contact night-coverage [email or web address] Password [email or web address]

## 2017-12-22 NOTE — Progress Notes (Addendum)
I have seen and examined this patient and agree with the plan of care . Patient is stable for discharge   Bicarbonate a little low  Continue oral bicarbonate. He can follow up with Dr Marval Regal. I think we should proceed with vein mapping and a VVS consult. This can be done as an outpatient. Appreciate Dr Alen Blew considering monoclonal treatment - idelalisib    Suprina Mandeville W 12/22/2017, 1:51 PM [facility] ROUNDING NOTE   Subjective:   Interval History: Doing well this morning with no acute complaints.   Objective:  Vital signs in last 24 hours:  Temp:  [97.5 F (36.4 C)-97.7 F (36.5 C)] 97.7 F (36.5 C) (07/27 0536) Pulse Rate:  [56-71] 71 (07/27 0536) Resp:  [18-20] 20 (07/27 0536) BP: (109-118)/(74-84) 118/84 (07/27 0536) SpO2:  [96 %-98 %] 96 % (07/27 0536)  Weight change:  Filed Weights   12/18/17 0100 12/18/17 2152 12/20/17 2117  Weight: 225 lb 1.4 oz (102.1 kg) 225 lb 1.4 oz (102.1 kg) 225 lb 1.4 oz (102.1 kg)    Intake/Output: I/O last 3 completed shifts: In: 6027.4 [P.O.:1478; I.V.:4497.4; IV Piggyback:52] Out: 0    Intake/Output this shift:  No intake/output data recorded.  Physical Exam: CVS- RRR RS- CTA ABD- BS present soft non-distended EXT- no edema   Basic Metabolic Panel: Recent Labs  Lab 12/18/17 0510 12/19/17 0504 12/20/17 0433 12/21/17 0620 12/22/17 0611  NA 138 139 140 137 136  K 4.2 4.4 4.3 4.5 4.5  CL 109 111 111 110 109  CO2 20* 20* 21* 18* 16*  GLUCOSE 94 112* 122* 214* 170*  BUN 65* 64* 63* 64* 73*  CREATININE 4.93* 4.82* 4.80* 5.03* 4.89*  CALCIUM 9.8 9.6 9.6 9.6 9.4  PHOS 5.1*  --   --   --   --     Liver Function Tests: Recent Labs  Lab 12/17/17 1144 12/17/17 1727  AST 16 29  ALT 14 15  ALKPHOS 69 72  BILITOT 1.1 1.1  PROT 6.7 6.5  ALBUMIN 4.2 4.2   No results for input(s): LIPASE, AMYLASE in the last 168 hours. No results for input(s): AMMONIA in the last 168 hours.  CBC: Recent Labs  Lab  12/17/17 1144 12/17/17 1727 12/18/17 0510 12/19/17 0504 12/20/17 0433 12/21/17 0620 12/22/17 0611  WBC 78.3* 89.8* 81.9* 71.1* 60.1* 71.4* 68.8*  NEUTROABS 3.9 4.5  --   --   --   --   --   HGB 11.0* 10.9* 9.7* 9.4* 9.3* 9.9* 9.2*  HCT 33.9* 34.9* 31.2* 29.9* 29.5* 31.1* 28.8*  MCV 94.7 96.4 95.7 95.5 96.7 95.7 96.3  PLT 102* 112* 97* 87* 82* 81* 76*    Cardiac Enzymes: No results for input(s): CKTOTAL, CKMB, CKMBINDEX, TROPONINI in the last 168 hours.  BNP: Invalid input(s): POCBNP  CBG: No results for input(s): GLUCAP in the last 168 hours.  Microbiology: Results for orders placed or performed during the hospital encounter of 12/17/17  MRSA PCR Screening     Status: Abnormal   Collection Time: 12/18/17  5:40 AM  Result Value Ref Range Status   MRSA by PCR POSITIVE (A) NEGATIVE Final    Comment:        The GeneXpert MRSA Assay (FDA approved for NASAL specimens only), is one component of a comprehensive MRSA colonization surveillance program. It is not intended to diagnose MRSA infection nor to guide or monitor treatment for MRSA infections. RESULT CALLED TO, READ BACK BY AND VERIFIED WITH: M WEST RN  12/18/17 0709 JDW Performed at [facility] [address] [place], [place] [address]   Culture, blood (routine x 2)     Status: None (Preliminary result)   Collection Time: 12/18/17  1:24 PM  Result Value Ref Range Status   Specimen Description BLOOD RIGHT ANTECUBITAL  Final   Special Requests   Final    BOTTLES DRAWN AEROBIC ONLY Blood Culture adequate volume   Culture   Final    NO GROWTH 3 DAYS Performed at [facility], [place] [address] [place], [place] [address]    Report Status PENDING  Incomplete  Culture, blood (routine x 2)     Status: None (Preliminary result)   Collection Time: 12/18/17  1:30 PM  Result Value Ref Range Status   Specimen Description BLOOD RIGHT HAND  Final   Special Requests   Final    BOTTLES DRAWN AEROBIC ONLY Blood  Culture adequate volume   Culture   Final    NO GROWTH 3 DAYS Performed at [facility], [place] [address], [place],  [address]    Report Status PENDING  Incomplete    Coagulation Studies: No results for input(s): LABPROT, INR in the last 72 hours.  Urinalysis: No results for input(s): COLORURINE, LABSPEC, PHURINE, GLUCOSEU, HGBUR, BILIRUBINUR, KETONESUR, PROTEINUR, UROBILINOGEN, NITRITE, LEUKOCYTESUR in the last 72 hours.  Invalid input(s): APPERANCEUR    Imaging: No results found.  Medications:   . sodium chloride 100 mL/hr at 12/22/17 0619   . allopurinol  100 mg Oral Daily  . amLODipine  5 mg Oral Daily  . carvedilol  12.5 mg Oral BID WC  . cephALEXin  500 mg Oral Q12H  . Chlorhexidine Gluconate Cloth  6 each Topical Q0600  . multivitamin with minerals  1 tablet Oral Daily  . mupirocin ointment  1 application Nasal BID  . tamsulosin  0.4 mg Oral Daily   neomycin-bacitracin-polymyxin   Patient Profile:Andrew J Nielsenis a 77 y.o.malew/ a PMHx notable for HTN, rapidly progressively worsening CKD, A-fib, and CLL started on chemotherapy on July 15th with worsening renal function. He was seen by his oncologist and nephrologist on the day of admission when his renal function was noted to have increased from 4.76 to 5.22-32 prompting him to be sent to the ED for evaluation. Renal US failed to demonstrate an obstructive etiology. Aside from initiating chemotherapy he has not altered his medication regimen or increased/initiated NSAID use. He was admitted for and acute on chronic renal failure.The acute nature resolved but a renal biopsy was obtained to evaluate the etiology of the chronic failure.   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 Given the rarity of CLL induced renal injury and predominance of CLL nests on asymptomatic post mortuum renal biopsy's I am not  certain the CLL is related to the renal injury but without an alternative etiology we feel this warrants further evaluation and have thus consulted Hem/Onc. Dr. Alen Blew evaluated patient, plan to continue CLL treatment with chlorambucil. Patient declined initiation of monoclonal antibody at this time, but is an option in the future.  -- Steroids discontinued yesterday -- Continue to monitor renal function, Cr 5-> 4.8, bicarb 16 2. Hypertension/volume -Normotensive. Agree with Amlodipine 10mg , agree with holding ARB and any nephrotoxic agents. Agree with gentle rehydration.  3. CLL -Per oncology, will continue current CLL treatment with chlorambucil given good response to it. Considering starting monoclonal  antibody in the future.   4. Anemia -Normocytic, Hgb stable. ESA not currently recommended.  5. Cellulitis secondary to a dermatological biopsy: As per primary. Agree with antibiotics.   LOS: 1 Kathi Ludwig, MD Internal Medicine PGY-2

## 2017-12-23 LAB — CULTURE, BLOOD (ROUTINE X 2)
Culture: NO GROWTH
Culture: NO GROWTH
SPECIAL REQUESTS: ADEQUATE
Special Requests: ADEQUATE

## 2017-12-23 NOTE — Progress Notes (Signed)
Patient left his Neosporin prescription.Called him and informed about this.Patient said he has a Neosporin at home but he is using the Silvadene which was recommended to him by his dermatologist.Patient said his wounds looks better and he is going to see his dermatolgist in a week.Patient is thankful on calling him about this.

## 2017-12-25 ENCOUNTER — Other Ambulatory Visit (HOSPITAL_COMMUNITY): Payer: Self-pay | Admitting: *Deleted

## 2017-12-25 ENCOUNTER — Telehealth (HOSPITAL_COMMUNITY): Payer: Self-pay | Admitting: *Deleted

## 2017-12-25 DIAGNOSIS — C9110 Chronic lymphocytic leukemia of B-cell type not having achieved remission: Secondary | ICD-10-CM

## 2017-12-25 MED ORDER — CHLORAMBUCIL 2 MG PO TABS: ORAL_TABLET | ORAL | 0 refills | Status: DC

## 2017-12-25 NOTE — Telephone Encounter (Signed)
Chart reviewed, per Dr. Walden Field last note and the communication note from RN. New sig on medication entered and refilled.

## 2017-12-25 NOTE — Telephone Encounter (Signed)
Pt called wanting to know if Dr. Walden Field wanted him to take the whole dose or proceed with taking the decreased dose of 15 of the Leukeran. I spoke with Dr. Walden Field and she advised that due to his kidneys he needed to just take the decreased dose. Pt was advised of this and verbalized understanding.

## 2017-12-27 DIAGNOSIS — X32XXXD Exposure to sunlight, subsequent encounter: Secondary | ICD-10-CM | POA: Diagnosis not present

## 2017-12-27 DIAGNOSIS — R944 Abnormal results of kidney function studies: Secondary | ICD-10-CM | POA: Diagnosis not present

## 2017-12-27 DIAGNOSIS — R001 Bradycardia, unspecified: Secondary | ICD-10-CM | POA: Diagnosis not present

## 2017-12-27 DIAGNOSIS — R7301 Impaired fasting glucose: Secondary | ICD-10-CM | POA: Diagnosis not present

## 2017-12-27 DIAGNOSIS — I429 Cardiomyopathy, unspecified: Secondary | ICD-10-CM | POA: Diagnosis not present

## 2017-12-27 DIAGNOSIS — L0100 Impetigo, unspecified: Secondary | ICD-10-CM | POA: Diagnosis not present

## 2017-12-27 DIAGNOSIS — C44319 Basal cell carcinoma of skin of other parts of face: Secondary | ICD-10-CM | POA: Diagnosis not present

## 2017-12-27 DIAGNOSIS — D696 Thrombocytopenia, unspecified: Secondary | ICD-10-CM | POA: Diagnosis not present

## 2017-12-27 DIAGNOSIS — Z08 Encounter for follow-up examination after completed treatment for malignant neoplasm: Secondary | ICD-10-CM | POA: Diagnosis not present

## 2017-12-27 DIAGNOSIS — G589 Mononeuropathy, unspecified: Secondary | ICD-10-CM | POA: Diagnosis not present

## 2017-12-27 DIAGNOSIS — N179 Acute kidney failure, unspecified: Secondary | ICD-10-CM | POA: Diagnosis not present

## 2017-12-27 DIAGNOSIS — Z6828 Body mass index (BMI) 28.0-28.9, adult: Secondary | ICD-10-CM | POA: Diagnosis not present

## 2017-12-27 DIAGNOSIS — Z09 Encounter for follow-up examination after completed treatment for conditions other than malignant neoplasm: Secondary | ICD-10-CM | POA: Diagnosis not present

## 2017-12-27 DIAGNOSIS — L03116 Cellulitis of left lower limb: Secondary | ICD-10-CM | POA: Diagnosis not present

## 2017-12-27 DIAGNOSIS — I482 Chronic atrial fibrillation: Secondary | ICD-10-CM | POA: Diagnosis not present

## 2017-12-27 DIAGNOSIS — I10 Essential (primary) hypertension: Secondary | ICD-10-CM | POA: Diagnosis not present

## 2017-12-27 DIAGNOSIS — L570 Actinic keratosis: Secondary | ICD-10-CM | POA: Diagnosis not present

## 2017-12-27 DIAGNOSIS — E782 Mixed hyperlipidemia: Secondary | ICD-10-CM | POA: Diagnosis not present

## 2017-12-27 DIAGNOSIS — Z85828 Personal history of other malignant neoplasm of skin: Secondary | ICD-10-CM | POA: Diagnosis not present

## 2017-12-27 DIAGNOSIS — C9110 Chronic lymphocytic leukemia of B-cell type not having achieved remission: Secondary | ICD-10-CM | POA: Diagnosis not present

## 2018-01-02 ENCOUNTER — Encounter (HOSPITAL_COMMUNITY): Payer: Self-pay | Admitting: Nephrology

## 2018-01-03 ENCOUNTER — Encounter (HOSPITAL_COMMUNITY): Payer: Self-pay | Admitting: Nephrology

## 2018-01-03 DIAGNOSIS — N400 Enlarged prostate without lower urinary tract symptoms: Secondary | ICD-10-CM | POA: Diagnosis not present

## 2018-01-03 DIAGNOSIS — N189 Chronic kidney disease, unspecified: Secondary | ICD-10-CM | POA: Diagnosis not present

## 2018-01-03 DIAGNOSIS — D696 Thrombocytopenia, unspecified: Secondary | ICD-10-CM | POA: Diagnosis not present

## 2018-01-03 DIAGNOSIS — I10 Essential (primary) hypertension: Secondary | ICD-10-CM | POA: Diagnosis not present

## 2018-01-03 DIAGNOSIS — N179 Acute kidney failure, unspecified: Secondary | ICD-10-CM | POA: Diagnosis not present

## 2018-01-03 DIAGNOSIS — E782 Mixed hyperlipidemia: Secondary | ICD-10-CM | POA: Diagnosis not present

## 2018-01-03 DIAGNOSIS — R7301 Impaired fasting glucose: Secondary | ICD-10-CM | POA: Diagnosis not present

## 2018-01-03 DIAGNOSIS — R5383 Other fatigue: Secondary | ICD-10-CM | POA: Diagnosis not present

## 2018-01-03 DIAGNOSIS — I482 Chronic atrial fibrillation: Secondary | ICD-10-CM | POA: Diagnosis not present

## 2018-01-03 DIAGNOSIS — C9110 Chronic lymphocytic leukemia of B-cell type not having achieved remission: Secondary | ICD-10-CM | POA: Diagnosis not present

## 2018-01-10 ENCOUNTER — Inpatient Hospital Stay (HOSPITAL_COMMUNITY): Payer: Medicare HMO | Attending: Hematology

## 2018-01-10 DIAGNOSIS — Z79899 Other long term (current) drug therapy: Secondary | ICD-10-CM | POA: Insufficient documentation

## 2018-01-10 DIAGNOSIS — I6200 Nontraumatic subdural hemorrhage, unspecified: Secondary | ICD-10-CM | POA: Diagnosis not present

## 2018-01-10 DIAGNOSIS — Z9221 Personal history of antineoplastic chemotherapy: Secondary | ICD-10-CM | POA: Diagnosis not present

## 2018-01-10 DIAGNOSIS — C9190 Lymphoid leukemia, unspecified not having achieved remission: Secondary | ICD-10-CM | POA: Insufficient documentation

## 2018-01-10 DIAGNOSIS — D696 Thrombocytopenia, unspecified: Secondary | ICD-10-CM | POA: Diagnosis not present

## 2018-01-10 DIAGNOSIS — N183 Chronic kidney disease, stage 3 (moderate): Secondary | ICD-10-CM | POA: Insufficient documentation

## 2018-01-10 DIAGNOSIS — E790 Hyperuricemia without signs of inflammatory arthritis and tophaceous disease: Secondary | ICD-10-CM

## 2018-01-10 DIAGNOSIS — I129 Hypertensive chronic kidney disease with stage 1 through stage 4 chronic kidney disease, or unspecified chronic kidney disease: Secondary | ICD-10-CM | POA: Insufficient documentation

## 2018-01-10 DIAGNOSIS — N179 Acute kidney failure, unspecified: Secondary | ICD-10-CM | POA: Diagnosis not present

## 2018-01-10 DIAGNOSIS — Z87891 Personal history of nicotine dependence: Secondary | ICD-10-CM | POA: Diagnosis not present

## 2018-01-10 DIAGNOSIS — C9110 Chronic lymphocytic leukemia of B-cell type not having achieved remission: Secondary | ICD-10-CM

## 2018-01-10 DIAGNOSIS — I4891 Unspecified atrial fibrillation: Secondary | ICD-10-CM | POA: Diagnosis not present

## 2018-01-10 LAB — CBC WITH DIFFERENTIAL/PLATELET
Basophils Absolute: 0 10*3/uL (ref 0.0–0.1)
Basophils Relative: 0 %
EOS PCT: 1 %
Eosinophils Absolute: 0.3 10*3/uL (ref 0.0–0.7)
HEMATOCRIT: 31.7 % — AB (ref 39.0–52.0)
Hemoglobin: 10.4 g/dL — ABNORMAL LOW (ref 13.0–17.0)
LYMPHS ABS: 28.6 10*3/uL — AB (ref 0.7–4.0)
Lymphocytes Relative: 88 %
MCH: 31.1 pg (ref 26.0–34.0)
MCHC: 32.8 g/dL (ref 30.0–36.0)
MCV: 94.9 fL (ref 78.0–100.0)
Monocytes Absolute: 0.3 10*3/uL (ref 0.1–1.0)
Monocytes Relative: 1 %
NEUTROS ABS: 3.3 10*3/uL (ref 1.7–7.7)
Neutrophils Relative %: 10 %
Platelets: 82 10*3/uL — ABNORMAL LOW (ref 150–400)
RBC: 3.34 MIL/uL — ABNORMAL LOW (ref 4.22–5.81)
RDW: 14.7 % (ref 11.5–15.5)
WBC: 32.5 10*3/uL — ABNORMAL HIGH (ref 4.0–10.5)

## 2018-01-10 LAB — COMPREHENSIVE METABOLIC PANEL
ALBUMIN: 4.1 g/dL (ref 3.5–5.0)
ALK PHOS: 53 U/L (ref 38–126)
ALT: 14 U/L (ref 0–44)
ANION GAP: 8 (ref 5–15)
AST: 18 U/L (ref 15–41)
BUN: 63 mg/dL — ABNORMAL HIGH (ref 8–23)
CALCIUM: 9.2 mg/dL (ref 8.9–10.3)
CHLORIDE: 109 mmol/L (ref 98–111)
CO2: 23 mmol/L (ref 22–32)
CREATININE: 4.36 mg/dL — AB (ref 0.61–1.24)
GFR calc Af Amer: 14 mL/min — ABNORMAL LOW (ref >60)
GFR calc non Af Amer: 12 mL/min — ABNORMAL LOW (ref >60)
GLUCOSE: 149 mg/dL — AB (ref 70–99)
Potassium: 3.9 mmol/L (ref 3.5–5.1)
SODIUM: 140 mmol/L (ref 135–145)
Total Bilirubin: 1 mg/dL (ref 0.3–1.2)
Total Protein: 6.4 g/dL — ABNORMAL LOW (ref 6.5–8.1)

## 2018-01-10 LAB — URIC ACID: Uric Acid, Serum: 6.8 mg/dL (ref 3.7–8.6)

## 2018-01-10 LAB — LACTATE DEHYDROGENASE: LDH: 105 U/L (ref 98–192)

## 2018-01-11 LAB — CALCIUM, IONIZED: Calcium, Ionized, Serum: 4.9 mg/dL (ref 4.5–5.6)

## 2018-01-14 ENCOUNTER — Inpatient Hospital Stay (HOSPITAL_COMMUNITY): Payer: Medicare HMO | Admitting: Internal Medicine

## 2018-01-14 ENCOUNTER — Encounter (HOSPITAL_COMMUNITY): Payer: Self-pay | Admitting: Internal Medicine

## 2018-01-14 VITALS — BP 127/85 | HR 81 | Temp 97.7°F | Resp 18 | Wt 224.0 lb

## 2018-01-14 DIAGNOSIS — N179 Acute kidney failure, unspecified: Secondary | ICD-10-CM

## 2018-01-14 DIAGNOSIS — I129 Hypertensive chronic kidney disease with stage 1 through stage 4 chronic kidney disease, or unspecified chronic kidney disease: Secondary | ICD-10-CM

## 2018-01-14 DIAGNOSIS — I6200 Nontraumatic subdural hemorrhage, unspecified: Secondary | ICD-10-CM

## 2018-01-14 DIAGNOSIS — D696 Thrombocytopenia, unspecified: Secondary | ICD-10-CM | POA: Diagnosis not present

## 2018-01-14 DIAGNOSIS — N183 Chronic kidney disease, stage 3 unspecified: Secondary | ICD-10-CM

## 2018-01-14 DIAGNOSIS — C9190 Lymphoid leukemia, unspecified not having achieved remission: Secondary | ICD-10-CM | POA: Diagnosis not present

## 2018-01-14 DIAGNOSIS — Z9221 Personal history of antineoplastic chemotherapy: Secondary | ICD-10-CM | POA: Diagnosis not present

## 2018-01-14 DIAGNOSIS — Z87891 Personal history of nicotine dependence: Secondary | ICD-10-CM | POA: Diagnosis not present

## 2018-01-14 DIAGNOSIS — I4891 Unspecified atrial fibrillation: Secondary | ICD-10-CM

## 2018-01-14 DIAGNOSIS — Z79899 Other long term (current) drug therapy: Secondary | ICD-10-CM | POA: Diagnosis not present

## 2018-01-14 DIAGNOSIS — C9110 Chronic lymphocytic leukemia of B-cell type not having achieved remission: Secondary | ICD-10-CM

## 2018-01-14 NOTE — Progress Notes (Signed)
Diagnosis CLL (chronic lymphocytic leukemia) ([place]) - Plan: CBC with Differential/Platelet, Comprehensive metabolic panel, Lactate dehydrogenase, Calcium, ionized, Uric acid  Stage 3 chronic kidney disease ([place]) - Plan: CBC with Differential/Platelet, Comprehensive metabolic panel, Lactate dehydrogenase, Calcium, ionized, Uric acid  Staging Cancer Staging CLL (chronic lymphocytic leukemia) ([place]) Staging form: Chronic Lymphocytic Leukemia / Small Lymphocytic Lymphoma, AJCC 8th Edition - Clinical stage from 09/04/2016: Modified Rai Stage IV (Binet: Stage C, Modified Rai risk: High, Lymphocytosis: Present, Adenopathy: Present, Organomegaly: Present, Anemia: Absent, Thrombocytopenia: Present) - Signed by Baird Cancer, PA-C on 09/04/2016   Assessment and Plan:  Historical data obtained from note dated 11/08/2017.  1.   CLL.  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 Pt was previously followed by Dr. Talbert Cage.  CT neck/chest/abd/pelvis on 08/03/16 showed progressive adenopathy. 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 Progressive thrombocytopenia appreciated with platelets <100,000. Treatment initiated with Imbruvica 420 mg daily on 08/19/16.  Imbruvica stopped on 10/11/16 for subdural hematoma possibly secondary to fall in January 2018 vs. Effects of imbruvica.  Pt had previous discussion with Dr. Talbert Cage regarding recommendations due to discontinuation of  imbruvica permanently in light of the subdural hematoma.    Labs done 11/05/2017 showed WBC 99.9 which is increased from 82.6 on labs done in 07/2017.  Plt count was also decreased at 97,000 and was previously 111,000.  He has stable lymphocyte count at 94%.   Thrombocytopenia had worsened and is now less than 100,000.  WBC has also increased.  Pt remains asymptomatic.    PET scan for restaging evaluation of adenopathy which was chosen due to worsening renal function.   Pet scan done 11/15/2017 showed  IMPRESSION: Diffuse hypermetabolic  lymphadenopathy throughout the neck, chest, abdomen, and pelvis, without significant change in size compared to prior CT on 08/03/2016.  No new or progressive lymphadenopathy identified.  Previously options of therapy discussed with  bendamustin + rituxan or chlorambucil + obinutuzumab. Patient would prefer an oral regimen.    He was provided written information about Chlorambucil and obinutuzumab. He does not desire to take obinutuzumab due to it being an infusional agent.  Chlorambucil originally dosed at 0.5 mg/kg D1 and D15.   Based on weight and reduction due to RI dose  He was started 40 mg D1 and D15.  Due to worsening renal function dosing is now 30 mg D1 and D15.    Hep B and C and HIV was checked prior to therapy initiation and all testing was negative.   Labs done 01/10/2018 reviewed and showed WBC 32.5 HB 10.4 plts 82,000.  Chemistries show K+ 3.9, Cr 4.36 and normal LFTS.  Uric acid is 6.8.  Ionized calcium is 4.9.  Labs have improved compared to labs done in He reports he will see nephrology on 01/24/2018.  He will RTC 02/04/2018 for follow-up and repeat labs.   He is advised to notify the office if he has any problems prior to his next visit.   2.  CKD.  Labs done 01/10/2018 showed Cr 4.36 which is slightly improved from labs done 12/22/2017 with Cr 4.89.  Uric acid is 6.8.  He should continue close follow-up with nephrology. Pt was treated with rasburicase to decrease risk for TLS.  Continue hydration.  He had kidney biopsy done while hospitalized on 12/19/2017 and path returned as CLL.  Will continue to monitor renal function as therapy proceeds.    3.  TLS prophylaxis.  Pt was treated with Rasburicase.    UA  is WNL at 6.8 on labs done 01/10/2018.  Will continue to follow due to kidney involvement with CLL.  Pt no longer on allopurinol.    4.  Subdural hematoma s/p craniotomy/Headaches: Follow-up with Dr. Sherwood Gambler, neurosurgery.   5.  HTN.  BP is 127/85.  Follow-up with PCP.     Interval History:  Historical data obtained from note dated 11/08/2017.  CLL.  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 Zap 70 was 7.  Pt was previously followed by Dr. Talbert Cage.  CT neck/chest/abd/pelvis on 08/03/16 showed progressive adenopathy. 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 Progressive thrombocytopenia appreciated with platelets <100,000. Treatment initiated with Imbruvica 420 mg daily on 08/19/16.  Imbruvica stopped on 10/11/16 for subdural hematoma possibly secondary to fall in January 2018 vs. Effects of imbruvica.  Pt had previous discussion with Dr. Talbert Cage regarding treatment recommendations due to discontinuation of  imbruvica permanently in light of the subdural hematoma.    Current Status:  Pt is seen today for follow-up.  He reports he will see nephrology on 01/24/2018. He denies any nausea, vomiting, abdominal pain, fevers. Adenopathy continues to improve.  He is tolerating Chlorambucil without problems.     CLL (chronic lymphocytic leukemia) ([place])   03/09/2015 Initial Diagnosis    CLL (chronic lymphocytic leukemia) ([place])    08/03/2016 Imaging    CT neck- 1. Diffuse and bulky lymphadenopathy in keeping with history of CLL. Nodal enlargement has diffusely progressed since April 2016. 2. Chest and abdomen CT reported separately.    08/03/2016 Imaging    CT CAP- 1. Bulky lymphadenopathy in the chest, abdomen, and pelvis as described. 2. Splenomegaly. 3. Coronary artery and thoracoabdominal aortic atherosclerosis. 4. Prostatomegaly. 5. Several tiny lucent lesions in the bony anatomic pelvis, nonspecific, but attention on follow-up imaging recommended.    08/10/2016 Procedure    Bone marrow aspiration and biopsy by IR.    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 PERIPHERAL BLOOD: - CHRONIC LYMPHOCYTIC LEUKEMIA. - THROMBOCYTOPENIA.     08/11/2016 Pathology Results    Bone Marrow Flow Cytometry - CHRONIC LYMPHOCYTIC LEUKEMIA/SMALL LYMPHOCYTIC LYMPHOMA.    08/16/2016 Pathology Results    Normal cytogenetics.  Molecular cytogenetic analysis by [facility] demonstrates a 13q-    08/19/2016 -  Chemotherapy    Imbruvica 420 mg daily       Problem List Patient Active Problem List   Diagnosis Date Noted  . AKI (acute kidney injury) ([place]) [N17.9] 12/17/2017  . Hypertension [I10]   . Subdural hematoma ([place]) [S06.5X9A] 10/12/2016  . A-fib ([place]) [I48.91] 10/12/2016  . Hypoxemia [R09.02] 05/13/2016  . Preventative health care [Z00.00] 03/06/2016  . CLL (chronic lymphocytic leukemia) ([place]) [C91.90] 03/09/2015    Past Medical History Past Medical History:  Diagnosis Date  . Atrial fibrillation ([place])   . Chronic kidney disease    stage 3  . Chronic lymphocytic leukemia ([place])   . CLL (chronic lymphocytic leukemia) ([place])   . Dysrhythmia    Hx. of Atrial Fibrillation- has resolved  . Hypertension   . Pinched nerve in neck     Past Surgical History Past Surgical History:  Procedure Laterality Date  . CATARACT EXTRACTION, BILATERAL    . COLONOSCOPY  07/10/2011   Procedure: COLONOSCOPY;  Surgeon: Dorothyann Peng, MD;  [facility];  Service: Endoscopy;  Laterality: N/A;  10:30 AM  . CRANIOTOMY N/A 10/14/2016   Procedure:  CRANIOTOMY HEMATOMA EVACUATION SUBDURAL;  Surgeon: Jovita Gamma, MD;  Location: [place];  Service: Neurosurgery;  Laterality: N/A;  CRANIOTOMY HEMATOMA EVACUATION SUBDURAL  . THYROIDECTOMY, PARTIAL    . TONSILLECTOMY      Family History Family History  Problem Relation Age of Onset  . CAD Father 71  . Cancer Mother   . Alzheimer's disease Mother   . Colon cancer Neg Hx      Social History  reports that he quit smoking about 47 years ago. He has a 15.00 pack-year smoking history. He has never used smokeless tobacco. He reports that he does not drink alcohol or use  drugs.  Medications  Current Outpatient Medications:  .  carvedilol (COREG) 3.125 MG tablet, , Disp: , Rfl:  .  chlorambucil (LEUKERAN) 2 MG tablet, Take 15 tablets (48m total) on Day 1 and Day 15. Give on an empty stomach 1 hour before or 2 hours after meals., Disp: 30 tablet, Rfl: 0 .  Coenzyme Q10 (COQ-10) 200 MG CAPS, Take 400 mg by mouth every evening., Disp: , Rfl:  .  Multiple Vitamin (MULITIVITAMIN WITH MINERALS) TABS, Take 1 tablet by mouth daily., Disp: , Rfl:  .  sodium bicarbonate 650 MG tablet, Take 1 tablet (650 mg total) by mouth 2 (two) times daily., Disp: 60 tablet, Rfl: 0 .  tamsulosin (FLOMAX) 0.4 MG CAPS capsule, Take 0.4 mg by mouth daily. , Disp: , Rfl:   Allergies Patient has no known allergies.  Review of Systems Review of Systems - Oncology ROS negative   Physical Exam  Vitals Wt Readings from Last 3 Encounters:  01/14/18 224 lb (101.6 kg)  12/20/17 225 lb 1.4 oz (102.1 kg)  12/17/17 223 lb 12.8 oz (101.5 kg)   Temp Readings from Last 3 Encounters:  01/14/18 97.7 F (36.5 C) (Oral)  12/22/17 97.7 F (36.5 C) (Oral)  12/17/17 98.3 F (36.8 C) (Oral)   BP Readings from Last 3 Encounters:  01/14/18 127/85  12/22/17 122/68  12/17/17 118/69   Pulse Readings from Last 3 Encounters:  01/14/18 81  12/22/17 (!) 53  12/17/17 73   Constitutional: Well-developed, well-nourished, and in no distress.   HENT: Head: Normocephalic and atraumatic.  Mouth/Throat: No oropharyngeal exudate. Mucosa moist. Eyes: Pupils are equal, round, and reactive to light. Conjunctivae are normal. No scleral icterus.  Neck: Normal range of motion. Neck supple. No JVD present.  Cardiovascular: Normal rate, regular rhythm and normal heart sounds.  Exam reveals no gallop and no friction rub.   No murmur heard. Pulmonary/Chest: Effort normal and breath sounds normal. No respiratory distress. No wheezes.No rales.  Abdominal: Soft. Bowel sounds are normal. No distension. There is  no tenderness. There is no guarding.  Musculoskeletal: No edema or tenderness.  Lymphadenopathy: Adenopathy in Cervical regions improving. No axillary adenopathy palpable Neurological: Alert and oriented to person, place, and time. No cranial nerve deficit.  Skin: Skin is warm and dry. No rash noted. No erythema. No pallor.  Psychiatric: Affect and judgment normal.   Labs No visits with results within 3 Day(s) from this visit.  Latest known visit with results is:  Appointment on 01/10/2018  Component Date Value Ref Range Status  . WBC 01/10/2018 32.5* 4.0 - 10.5 K/uL Final  . RBC 01/10/2018 3.34* 4.22 - 5.81 MIL/uL Final  . Hemoglobin 01/10/2018 10.4* 13.0 - 17.0 g/dL Final  . HCT 01/10/2018 31.7* 39.0 - 52.0 % Final  . MCV 01/10/2018 94.9  78.0 - 100.0 fL Final  .  MCH 01/10/2018 31.1  26.0 - 34.0 pg Final  . MCHC 01/10/2018 32.8  30.0 - 36.0 g/dL Final  . RDW 01/10/2018 14.7  11.5 - 15.5 % Final  . Platelets 01/10/2018 82* 150 - 400 K/uL Final   Comment: SPECIMEN CHECKED FOR CLOTS PLATELET COUNT CONFIRMED BY SMEAR   . Neutrophils Relative % 01/10/2018 10  % Final  . Lymphocytes Relative 01/10/2018 88  % Final  . Monocytes Relative 01/10/2018 1  % Final  . Eosinophils Relative 01/10/2018 1  % Final  . Basophils Relative 01/10/2018 0  % Final  . Neutro Abs 01/10/2018 3.3  1.7 - 7.7 K/uL Final  . Lymphs Abs 01/10/2018 28.6* 0.7 - 4.0 K/uL Final  . Monocytes Absolute 01/10/2018 0.3  0.1 - 1.0 K/uL Final  . Eosinophils Absolute 01/10/2018 0.3  0.0 - 0.7 K/uL Final  . Basophils Absolute 01/10/2018 0.0  0.0 - 0.1 K/uL Final  . WBC Morphology 01/10/2018 ABSOLUTE LYMPHOCYTOSIS   Final   Performed at [facility], [address], [place], [place] [address]  . Sodium 01/10/2018 140  135 - 145 mmol/L Final  . Potassium 01/10/2018 3.9  3.5 - 5.1 mmol/L Final  . Chloride 01/10/2018 109  98 - 111 mmol/L Final  . CO2 01/10/2018 23  22 - 32 mmol/L Final  . Glucose, Bld 01/10/2018 149* 70 - 99  mg/dL Final  . BUN 01/10/2018 63* 8 - 23 mg/dL Final  . Creatinine, Ser 01/10/2018 4.36* 0.61 - 1.24 mg/dL Final  . Calcium 01/10/2018 9.2  8.9 - 10.3 mg/dL Final  . Total Protein 01/10/2018 6.4* 6.5 - 8.1 g/dL Final  . Albumin 01/10/2018 4.1  3.5 - 5.0 g/dL Final  . AST 01/10/2018 18  15 - 41 U/L Final  . ALT 01/10/2018 14  0 - 44 U/L Final  . Alkaline Phosphatase 01/10/2018 53  38 - 126 U/L Final  . Total Bilirubin 01/10/2018 1.0  0.3 - 1.2 mg/dL Final  . GFR calc non Af Amer 01/10/2018 12* >60 mL/min Final  . GFR calc Af Amer 01/10/2018 14* >60 mL/min Final   Comment: (NOTE) The eGFR has been calculated using the CKD EPI equation. This calculation has not been validated in all clinical situations. eGFR's persistently <60 mL/min signify possible Chronic Kidney Disease.   Georgiann Hahn gap 01/10/2018 8  5 - 15 Final   Performed at [facility], [address], [place], [place] [address]  . LDH 01/10/2018 105  98 - 192 U/L Final   Performed at [facility], [address] [place], [place] [address]  . Uric Acid, Serum 01/10/2018 6.8  3.7 - 8.6 mg/dL Final   Performed at [facility], [address] [place], [place] [address]  . Calcium, Ionized, Serum 01/10/2018 4.9  4.5 - 5.6 mg/dL Final   Comment: (NOTE) Performed At: [facility] [place], [place] [id number] Rush Farmer MD ZD:[id number]      Pathology Orders Placed This Encounter  Procedures  . CBC with Differential/Platelet    Standing Status:   Future    Standing Expiration Date:   01/15/2019  . Comprehensive metabolic panel    Standing Status:   Future    Standing Expiration Date:   01/15/2019  . Lactate dehydrogenase    Standing Status:   Future    Standing Expiration Date:   01/15/2019  . Calcium, ionized    Standing Status:   Future    Standing Expiration Date:   01/15/2019  .  Uric acid    Standing Status:   Future    Standing Expiration Date:   01/15/2019       Zoila Shutter MD

## 2018-01-18 ENCOUNTER — Encounter (HOSPITAL_COMMUNITY): Payer: Self-pay | Admitting: *Deleted

## 2018-01-18 NOTE — Progress Notes (Signed)
I was asked by the physician to call the patient and explain how to take the chlorambucil.  Patient states that he takes it and then counts 15 days and takes it again, thus having 15 days in between.  I explained to him that he should be taking it on days 1 and on days 15 which is only 14 days in between so essentially that is every other week.  Patient states he last took the medication on August 14th. I explained to him that his next dose will be on August 28th and he verbalizes understanding.  I told him that moving forward his doses will be on Wednesdays.  He verbalizes understanding and is able to teach it back to me.

## 2018-01-24 DIAGNOSIS — C44319 Basal cell carcinoma of skin of other parts of face: Secondary | ICD-10-CM | POA: Diagnosis not present

## 2018-01-24 DIAGNOSIS — L570 Actinic keratosis: Secondary | ICD-10-CM | POA: Diagnosis not present

## 2018-01-24 DIAGNOSIS — X32XXXD Exposure to sunlight, subsequent encounter: Secondary | ICD-10-CM | POA: Diagnosis not present

## 2018-01-24 DIAGNOSIS — C9190 Lymphoid leukemia, unspecified not having achieved remission: Secondary | ICD-10-CM | POA: Diagnosis not present

## 2018-01-24 DIAGNOSIS — I4891 Unspecified atrial fibrillation: Secondary | ICD-10-CM | POA: Diagnosis not present

## 2018-01-24 DIAGNOSIS — E785 Hyperlipidemia, unspecified: Secondary | ICD-10-CM | POA: Diagnosis not present

## 2018-01-24 DIAGNOSIS — N200 Calculus of kidney: Secondary | ICD-10-CM | POA: Diagnosis not present

## 2018-01-24 DIAGNOSIS — I700 Atherosclerosis of aorta: Secondary | ICD-10-CM | POA: Diagnosis not present

## 2018-01-24 DIAGNOSIS — I129 Hypertensive chronic kidney disease with stage 1 through stage 4 chronic kidney disease, or unspecified chronic kidney disease: Secondary | ICD-10-CM | POA: Diagnosis not present

## 2018-01-24 DIAGNOSIS — N183 Chronic kidney disease, stage 3 (moderate): Secondary | ICD-10-CM | POA: Diagnosis not present

## 2018-01-24 DIAGNOSIS — D2371 Other benign neoplasm of skin of right lower limb, including hip: Secondary | ICD-10-CM | POA: Diagnosis not present

## 2018-01-24 DIAGNOSIS — D696 Thrombocytopenia, unspecified: Secondary | ICD-10-CM | POA: Diagnosis not present

## 2018-01-24 DIAGNOSIS — N179 Acute kidney failure, unspecified: Secondary | ICD-10-CM | POA: Diagnosis not present

## 2018-01-25 ENCOUNTER — Telehealth (HOSPITAL_COMMUNITY): Payer: Self-pay | Admitting: Internal Medicine

## 2018-01-25 NOTE — Telephone Encounter (Signed)
PER DIPLOMAT PHARMACIST SCRIPT FOR REDUCED DOSE OF LEUKERAN WAS NOT RECVD. GAVE VERBAL OVER PHONE.  NEW SCRIPT WILL BE PROCESSED AND PT WILL BE CALLED

## 2018-01-29 ENCOUNTER — Encounter: Payer: Self-pay | Admitting: Physician Assistant

## 2018-01-29 NOTE — Progress Notes (Addendum)
Cardiology Office Note    Date:  01/31/2018  ID:  Andrew [name], Andrew [name] 1940/06/04, MRN [id number] PCP:  [name], MD  Cardiologist:  Carlyle Dolly, MD  Chief Complaint: post-hospital f/u  History of Present Illness:  Andrew [name] is a 77 y.o. male with history of PAF (likely now persistent), subdural hematoma s/p craniotomy/evacuation 09/2016 (followed by neurosurgery), CLL followed by oncology, thrombocytopenia, HTN, cardiomyopathy, CKD (previously stage III) who presents for post-hospital follow-up.   He reports being told several year ago that his LVEF was 35%, this was around the time of his first diagnosis of afib. A cath note scanned in from 2007 corroborates this, cath showed minimal CAD and CM was felt nonischemic. He had a cardioversion and was on Coumadin for a period of time but this was eventually discontinued for unclear reasons. Last echo 09/2016 showed mild LVH, EF 55-60%, trivial AI, mildly dilated LA. In 09/2016 he was found to have a subdural hematoma requiring surgery. He was on ASA 325mg  at the time. This occurred 4-5 months following a fall so it was a "bit of a puzzle" with the timing per the family. Dr. Nelly Laurence notes have indicated him not to be a candidate for anticoagulation due to prior subdural and labile platelets.  More recently he was admitted 11/2017 with AKI superimposed on CKD stage III (Cr 5), felt due to chemo, Micardis, dehydration. Oncology saw the patient and felt this patient's renal failure did appear to be related to CLL infiltration of the kidney. Discharge summary indicated to continue carvedilol and amlodipine but they were stopped on AVS for unclear reasons. They were given the day of DC and it does not look like any acute hypotensive or bradycardic events were noted. The patient assumed these were stopped as his vitals were well controlled. Last Cr 4.36, WBC 32.5, plt 82, Hgb 10.4. TSH 09/2016 wnl. His PCP resumed carvedilol 3.125mg  BID soon after DC.  He has started a new chemo for his CLL and is overall feeling good from cardiac standpoint. He is unaware of his Afib. He notes general fatigue the last 4-5 months. He was able to power wash porch this AM. CHADSVASC 4.     Past Medical History:  Diagnosis Date  . CKD (chronic kidney disease), stage IV ([place])   . CLL (chronic lymphocytic leukemia) ([place])   . Hypertension   . NICM (nonischemic cardiomyopathy) ([place])    a. cath 2007, EF 35%, minimal CAD. b. EF normal 2018.  Marland Kitchen PAF (paroxysmal atrial fibrillation) ([place])   . Pinched nerve in neck   . SDH (subdural hematoma) ([place])   . Thrombocytopenia ([place])     Past Surgical History:  Procedure Laterality Date  . CATARACT EXTRACTION, BILATERAL    . COLONOSCOPY  07/10/2011   Procedure: COLONOSCOPY;  Surgeon: Dorothyann Peng, MD;  [facility];  Service: Endoscopy;  Laterality: N/A;  10:30 AM  . CRANIOTOMY N/A 10/14/2016   Procedure: CRANIOTOMY HEMATOMA EVACUATION SUBDURAL;  Surgeon: Jovita Gamma, MD;  Location: [place];  Service: Neurosurgery;  Laterality: N/A;  CRANIOTOMY HEMATOMA EVACUATION SUBDURAL  . THYROIDECTOMY, PARTIAL    . TONSILLECTOMY      Current Medications: Current Meds  Medication Sig  . carvedilol (COREG) 3.125 MG tablet   . chlorambucil (LEUKERAN) 2 MG tablet Take 15 tablets (30mg  total) on Day 1 and Day 15. Give on an empty stomach 1 hour before or 2 hours after meals.  . Coenzyme Q10 (COQ-10) 200  MG CAPS Take 400 mg by mouth every evening.  . Multiple Vitamin (MULITIVITAMIN WITH MINERALS) TABS Take 1 tablet by mouth daily.  . sodium bicarbonate 650 MG tablet Take 1 tablet (650 mg total) by mouth 2 (two) times daily.  . tamsulosin (FLOMAX) 0.4 MG CAPS capsule Take 0.4 mg by mouth daily.       Allergies:   Patient has no known allergies.   Social History   Socioeconomic History  . Marital status: Married    Spouse name: Not on file  . Number of children: Not on file  . Years of education: Not on file    . Highest education level: Not on file  Occupational History  . Not on file  Social Needs  . Financial resource strain: Not on file  . Food insecurity:    Worry: Not on file    Inability: Not on file  . Transportation needs:    Medical: Not on file    Non-medical: Not on file  Tobacco Use  . Smoking status: Former Smoker    Packs/day: 1.00    Years: 15.00    Pack years: 15.00    Last attempt to quit: 01/26/1971    Years since quitting: 47.0  . Smokeless tobacco: Never Used  Substance and Sexual Activity  . Alcohol use: No  . Drug use: No  . Sexual activity: Not on file  Lifestyle  . Physical activity:    Days per week: Not on file    Minutes per session: Not on file  . Stress: Not on file  Relationships  . Social connections:    Talks on phone: Not on file    Gets together: Not on file    Attends religious service: Not on file    Active member of club or organization: Not on file    Attends meetings of clubs or organizations: Not on file    Relationship status: Not on file  Other Topics Concern  . Not on file  Social History Narrative  . Not on file     Family History:  The patient's family history includes Alzheimer's disease in his mother; CAD (age of onset: 16) in his father; Cancer in his mother. There is no history of Colon cancer.  ROS:   Please see the history of present illness.  All other systems are reviewed and otherwise negative.    PHYSICAL EXAM:   VS:  BP 114/64 (BP Location: Right Arm)   Pulse 80   Ht 6\' 2"  (1.88 m)   Wt 214 lb (97.1 kg)   SpO2 97%   BMI 27.48 kg/m   BMI: Body mass index is 27.48 kg/m. GEN: Well nourished, well developed WM in no acute distress HEENT: normocephalic, atraumatic Neck: no JVD, carotid bruits, or masses Cardiac: Irregularly irregular, rate controlled; no murmurs, rubs, or gallops, no edema  Respiratory:  clear to auscultation bilaterally, normal work of breathing GI: soft, nontender, nondistended, + BS MS:  no deformity or atrophy Skin: warm and dry, no rash Neuro:  Alert and Oriented x 3, Strength and sensation are intact, follows commands Psych: euthymic mood, full affect  Wt Readings from Last 3 Encounters:  01/31/18 214 lb (97.1 kg)  01/14/18 224 lb (101.6 kg)  12/20/17 225 lb 1.4 oz (102.1 kg)      Studies/Labs Reviewed:   EKG:  EKG was ordered today and personally reviewed by me and demonstrates atrial fib 75bpm nonspecific QRS widening, nonspecific ST-T changes  Recent Labs:  01/10/2018: ALT 14; BUN 63; Creatinine, Ser 4.36; Hemoglobin 10.4; Platelets 82; Potassium 3.9; Sodium 140   Lipid Panel No results found for: CHOL, TRIG, HDL, CHOLHDL, VLDL, LDLCALC, LDLDIRECT  Additional studies/ records that were reviewed today include: Summarized above    ASSESSMENT & PLAN:   1. Persistent atrial fib - present by EKGs back to 2018 in our system, dx 2007. CHADSVASC 4. Patient reports Dr. Harl Bowie previously mentioned possibly adding ASA for his atrial fib. I discussed that ASA does not usually prevent stroke in atrial fib but I would clarify with MD regarding final anticoag recs. It's not clear that SDH was traumatic as it occurred 4-5 months after fall - this also was only on ASA 325mg  daily. His labile platelets also present an issue. For these reasons if Dr. Harl Bowie continues to feel he is not an anticoag candidate he would not be a candidate for Watchman either. Rate is well controlled. 2. NICM - now off ARB. Continue lower dose BB. VSS. Hopefully his LVEF will remain stable off ARB. Continue surveillance for HF symptoms. He is euvolemic today. 3. AKI on CKD stage III - following with Dr. Arty Baumgartner now. 4. HTN - controlled.  Disposition: F/u with Dr. Harl Bowie in 3-4 months.  Medication Adjustments/Labs and Tests Ordered: Current medicines are reviewed at length with the patient today.  Concerns regarding medicines are outlined above. Medication changes, Labs and Tests ordered today are  summarized above and listed in the Patient Instructions accessible in Encounters.   Signed, Charlie Pitter, PA-C  01/31/2018 2:27 PM    [facility] Location in [place], [place] [address] Ph: [phone]; Fax [phone]

## 2018-01-31 ENCOUNTER — Encounter: Payer: Self-pay | Admitting: Physician Assistant

## 2018-01-31 ENCOUNTER — Ambulatory Visit: Payer: Medicare HMO | Admitting: Physician Assistant

## 2018-01-31 VITALS — BP 114/64 | HR 80 | Ht 74.0 in | Wt 214.0 lb

## 2018-01-31 DIAGNOSIS — I10 Essential (primary) hypertension: Secondary | ICD-10-CM

## 2018-01-31 DIAGNOSIS — I428 Other cardiomyopathies: Secondary | ICD-10-CM | POA: Diagnosis not present

## 2018-01-31 DIAGNOSIS — N179 Acute kidney failure, unspecified: Secondary | ICD-10-CM

## 2018-01-31 DIAGNOSIS — N189 Chronic kidney disease, unspecified: Secondary | ICD-10-CM | POA: Diagnosis not present

## 2018-01-31 DIAGNOSIS — I480 Paroxysmal atrial fibrillation: Secondary | ICD-10-CM | POA: Diagnosis not present

## 2018-01-31 NOTE — Patient Instructions (Signed)
Medication Instructions:  Your physician recommends that you continue on your current medications as directed. Please refer to the Current Medication list given to you today.   Labwork: NONE   Testing/Procedures: NONE   Follow-Up: Your physician recommends that you schedule a follow-up appointment in: 3-4 Months with Dr. Harl Bowie    Any Other Special Instructions Will Be Listed Below (If Applicable).     If you need a refill on your cardiac medications before your next appointment, please call your pharmacy.  Thank you for choosing [facility]!

## 2018-02-01 ENCOUNTER — Telehealth: Payer: Self-pay | Admitting: Physician Assistant

## 2018-02-01 NOTE — Telephone Encounter (Signed)
Please call patient with update from Dr. Harl Bowie. I told them I would touch base with him regarding definitive blood thinner statement - he states "platelets still on the low side for me to be comfortable with anticoag or antiplatelet agent particularly with his prior bleed. I would remain off for now"  Thanks! Derryl Uher PA-C

## 2018-02-01 NOTE — Progress Notes (Signed)
Thx for seeing him, platelets still on the low side for me to be comfortable with anticoag or antiplatelet agent particularly with his prior bleed. I would remain off for now  Zandra Abts MD

## 2018-02-04 NOTE — Telephone Encounter (Signed)
Patient notified and voiced understanding.

## 2018-02-08 ENCOUNTER — Telehealth (HOSPITAL_COMMUNITY): Payer: Self-pay

## 2018-02-08 NOTE — Telephone Encounter (Signed)
Dr. Walden Field asked me to put in a note about a call she received call from patients urologist, Dr. Arty Baumgartner. He wanted to be sure it was okay for patient to be on a predisone taper. Dr. 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 Per Dr. Walden Field she stated that it was fine from her perspective.

## 2018-02-11 ENCOUNTER — Other Ambulatory Visit (HOSPITAL_COMMUNITY): Payer: Self-pay | Admitting: *Deleted

## 2018-02-11 ENCOUNTER — Inpatient Hospital Stay (HOSPITAL_COMMUNITY): Payer: Medicare HMO | Attending: Hematology

## 2018-02-11 DIAGNOSIS — C9190 Lymphoid leukemia, unspecified not having achieved remission: Secondary | ICD-10-CM | POA: Diagnosis not present

## 2018-02-11 DIAGNOSIS — C9110 Chronic lymphocytic leukemia of B-cell type not having achieved remission: Secondary | ICD-10-CM

## 2018-02-11 DIAGNOSIS — N183 Chronic kidney disease, stage 3 unspecified: Secondary | ICD-10-CM

## 2018-02-11 LAB — COMPREHENSIVE METABOLIC PANEL
ALBUMIN: 4.5 g/dL (ref 3.5–5.0)
ALT: 14 U/L (ref 0–44)
AST: 18 U/L (ref 15–41)
Alkaline Phosphatase: 40 U/L (ref 38–126)
Anion gap: 8 (ref 5–15)
BUN: 56 mg/dL — AB (ref 8–23)
CO2: 22 mmol/L (ref 22–32)
Calcium: 9.1 mg/dL (ref 8.9–10.3)
Chloride: 110 mmol/L (ref 98–111)
Creatinine, Ser: 5.13 mg/dL — ABNORMAL HIGH (ref 0.61–1.24)
GFR calc Af Amer: 11 mL/min — ABNORMAL LOW (ref >60)
GFR calc non Af Amer: 10 mL/min — ABNORMAL LOW (ref >60)
Glucose, Bld: 101 mg/dL — ABNORMAL HIGH (ref 70–99)
POTASSIUM: 3.9 mmol/L (ref 3.5–5.1)
SODIUM: 140 mmol/L (ref 135–145)
Total Bilirubin: 1 mg/dL (ref 0.3–1.2)
Total Protein: 6.6 g/dL (ref 6.5–8.1)

## 2018-02-11 LAB — CBC WITH DIFFERENTIAL/PLATELET
BASOS ABS: 0 10*3/uL (ref 0.0–0.1)
BASOS PCT: 0 %
EOS ABS: 0.2 10*3/uL (ref 0.0–0.7)
EOS PCT: 1 %
HCT: 34.4 % — ABNORMAL LOW (ref 39.0–52.0)
Hemoglobin: 11.6 g/dL — ABNORMAL LOW (ref 13.0–17.0)
LYMPHS PCT: 79 %
Lymphs Abs: 17.8 10*3/uL (ref 0.7–4.0)
MCH: 32.4 pg (ref 26.0–34.0)
MCHC: 33.7 g/dL (ref 30.0–36.0)
MCV: 96.1 fL (ref 78.0–100.0)
Monocytes Absolute: 0.4 10*3/uL (ref 0.1–1.0)
Monocytes Relative: 2 %
Neutro Abs: 3.9 10*3/uL (ref 1.7–7.7)
Neutrophils Relative %: 18 %
Platelets: 65 10*3/uL — ABNORMAL LOW (ref 150–400)
RBC: 3.58 MIL/uL — AB (ref 4.22–5.81)
RDW: 14.9 % (ref 11.5–15.5)
WBC: 22.3 10*3/uL — AB (ref 4.0–10.5)

## 2018-02-11 LAB — URIC ACID: Uric Acid, Serum: 6.8 mg/dL (ref 3.7–8.6)

## 2018-02-11 LAB — LACTATE DEHYDROGENASE: LDH: 105 U/L (ref 98–192)

## 2018-02-11 MED ORDER — CHLORAMBUCIL 2 MG PO TABS: ORAL_TABLET | ORAL | 0 refills | Status: DC

## 2018-02-11 NOTE — Telephone Encounter (Signed)
Chart reviewed, per Dr. Walden Field last note, patient is to take 30 mg days 1 and days 15. Leukeran refilled.

## 2018-02-12 LAB — CALCIUM, IONIZED: Calcium, Ionized, Serum: 4.9 mg/dL (ref 4.5–5.6)

## 2018-02-14 ENCOUNTER — Other Ambulatory Visit: Payer: Self-pay

## 2018-02-14 ENCOUNTER — Encounter (HOSPITAL_COMMUNITY): Payer: Self-pay | Admitting: Internal Medicine

## 2018-02-14 ENCOUNTER — Inpatient Hospital Stay (HOSPITAL_COMMUNITY): Payer: Medicare HMO | Attending: Internal Medicine | Admitting: Internal Medicine

## 2018-02-14 VITALS — BP 113/94 | HR 56 | Resp 20 | Wt 215.0 lb

## 2018-02-14 DIAGNOSIS — C9190 Lymphoid leukemia, unspecified not having achieved remission: Secondary | ICD-10-CM

## 2018-02-14 DIAGNOSIS — N184 Chronic kidney disease, stage 4 (severe): Secondary | ICD-10-CM | POA: Diagnosis not present

## 2018-02-14 DIAGNOSIS — I129 Hypertensive chronic kidney disease with stage 1 through stage 4 chronic kidney disease, or unspecified chronic kidney disease: Secondary | ICD-10-CM | POA: Diagnosis not present

## 2018-02-14 DIAGNOSIS — R161 Splenomegaly, not elsewhere classified: Secondary | ICD-10-CM | POA: Diagnosis not present

## 2018-02-14 DIAGNOSIS — I480 Paroxysmal atrial fibrillation: Secondary | ICD-10-CM | POA: Diagnosis not present

## 2018-02-14 DIAGNOSIS — Z87891 Personal history of nicotine dependence: Secondary | ICD-10-CM | POA: Insufficient documentation

## 2018-02-14 DIAGNOSIS — I429 Cardiomyopathy, unspecified: Secondary | ICD-10-CM | POA: Diagnosis not present

## 2018-02-14 DIAGNOSIS — Z23 Encounter for immunization: Secondary | ICD-10-CM | POA: Diagnosis not present

## 2018-02-14 DIAGNOSIS — Z79899 Other long term (current) drug therapy: Secondary | ICD-10-CM | POA: Insufficient documentation

## 2018-02-14 DIAGNOSIS — Z9221 Personal history of antineoplastic chemotherapy: Secondary | ICD-10-CM | POA: Insufficient documentation

## 2018-02-14 DIAGNOSIS — C9110 Chronic lymphocytic leukemia of B-cell type not having achieved remission: Secondary | ICD-10-CM

## 2018-02-14 MED ORDER — INFLUENZA VAC SPLIT QUAD 0.5 ML IM SUSY
PREFILLED_SYRINGE | INTRAMUSCULAR | Status: AC
  Filled 2018-02-14: qty 0.5

## 2018-02-14 MED ORDER — INFLUENZA VAC SPLIT QUAD 0.5 ML IM SUSY
0.5000 mL | PREFILLED_SYRINGE | Freq: Once | INTRAMUSCULAR | Status: AC
  Administered 2018-02-14: 0.5 mL via INTRAMUSCULAR

## 2018-02-14 NOTE — Progress Notes (Signed)
Diagnosis CLL (chronic lymphocytic leukemia) ([place]) - Plan: CBC with Differential/Platelet, Comprehensive metabolic panel, Lactate dehydrogenase  Staging Cancer Staging CLL (chronic lymphocytic leukemia) (HCC) Staging form: Chronic Lymphocytic Leukemia / Small Lymphocytic Lymphoma, AJCC 8th Edition - Clinical stage from 09/04/2016: Modified Rai Stage IV (Binet: Stage C, Modified Rai risk: High, Lymphocytosis: Present, Adenopathy: Present, Organomegaly: Present, Anemia: Absent, Thrombocytopenia: Present) - Signed by Baird Cancer, PA-C on 09/04/2016   Assessment and Plan:     1.   CLL.  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 Pt was previously followed by Dr. Talbert Cage.  CT neck/chest/abd/pelvis on 08/03/16 showed progressive adenopathy. 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 Progressive thrombocytopenia appreciated with platelets <100,000. Treatment initiated with Imbruvica 420 mg daily on 08/19/16.  Imbruvica stopped on 10/11/16 for subdural hematoma possibly secondary to fall in January 2018 vs. Effects of imbruvica.  Pt had previous discussion with Dr. Talbert Cage regarding recommendations due to discontinuation of  imbruvica permanently in light of the subdural hematoma.     11/05/2017 showed WBC 99.9 which is increased from 82.6 on labs done in 07/2017.  Plt count was also decreased at 97,000 and was previously 111,000.  He has stable lymphocyte count at 94%.   Thrombocytopenia had worsened and is now less than 100,000.  WBC has also increased.  Pt remains asymptomatic.    PET scan for restaging evaluation of adenopathy which was chosen due to worsening renal function.   Pet scan done 11/15/2017 showed  IMPRESSION: Diffuse hypermetabolic lymphadenopathy throughout the neck, chest, abdomen, and pelvis, without significant change in size compared to prior CT on 08/03/2016.  No new or progressive lymphadenopathy identified.  Previously options of therapy discussed with  bendamustin + rituxan  or chlorambucil + obinutuzumab. Patient would prefer an oral regimen.    He was provided written information about Chlorambucil and obinutuzumab. He does not desire to take obinutuzumab due to it being an infusional agent.  Chlorambucil originally dosed at 0.5 mg/kg D1 and D15.   Based on weight and reduction due to RI He was started on 40 mg D1 and D15.  Due to worsening renal function dosing is now 30 mg D1 and D15.  Will continue present dose.    Hep B and C and HIV was checked prior to therapy initiation and all testing was negative.   Labs done 02/11/2018 reviewed and showed WBC 22.3 HB 11.6 plts 65,000.  Chemistries WNL with K+ 3.9 and normal LFTS.   Pt will have repeat labs in 2 weeks prior to D15.  He will be seen for follow-up in 4 weeks.    2.  CKD.  Labs done 02/11/2018 reviewed and showed C 5.13.  Uric acid 6.8.   He should continue close follow-up with nephrology. Pt now on prednisone per nephrology.  Pt was treated with rasburicase to decrease risk for TLS.  Continue hydration.  He had kidney biopsy done while hospitalized on 12/19/2017 and path returned as CLL.  Will repeat chemistries in 2 weeks prior to D15.    3.  TLS prophylaxis.  Pt was treated with Rasburicase.    UA is WNL at 6.8 on labs done 02/11/2018. Will continue to follow due to kidney involvement with CLL.  Pt no longer on allopurinol.    4.  Subdural hematoma s/p craniotomy/Headaches: Follow-up with Dr. Sherwood Gambler, neurosurgery.   5.  HTN.  BP is 113/94.  Follow-up with PCP.    6.  Health maintenance.  Pt requesting flu  vaccine today.  Vaccine administered.    30 minutes spent with more than 50% spent in counseling and coordination of care.    Interval History:  Historical data obtained from note dated 11/08/2017.  CLL.  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 Zap 70 was 7.  Pt was previously followed by Dr. Talbert Cage.  CT neck/chest/abd/pelvis on 08/03/16 showed progressive adenopathy. Bone marrow biopsy on 08/10/16  revealed CLL/SLL. Progressive thrombocytopenia appreciated with platelets <100,000. Treatment initiated with Imbruvica 420 mg daily on 08/19/16.  Imbruvica stopped on 10/11/16 for subdural hematoma possibly secondary to fall in January 2018 vs. Effects of imbruvica.  Pt had previous discussion with Dr. Talbert Cage regarding treatment recommendations due to discontinuation of  imbruvica permanently in light of the subdural hematoma.    Current Status:  Pt is seen today for follow-up.  He reports he is doing well.  Denies any fevers, chills, night sweats and has noted on adenopathy. He is on prednisone per nephrology.  Pt requesting flu shot.      CLL (chronic lymphocytic leukemia) ([place])   03/09/2015 Initial Diagnosis    CLL (chronic lymphocytic leukemia) ([place])    08/03/2016 Imaging    CT neck- 1. Diffuse and bulky lymphadenopathy in keeping with history of CLL. Nodal enlargement has diffusely progressed since April 2016. 2. Chest and abdomen CT reported separately.    08/03/2016 Imaging    CT CAP- 1. Bulky lymphadenopathy in the chest, abdomen, and pelvis as described. 2. Splenomegaly. 3. Coronary artery and thoracoabdominal aortic atherosclerosis. 4. Prostatomegaly. 5. Several tiny lucent lesions in the bony anatomic pelvis, nonspecific, but attention on follow-up imaging recommended.    08/10/2016 Procedure    Bone marrow aspiration and biopsy by IR.    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 PERIPHERAL BLOOD: - CHRONIC LYMPHOCYTIC LEUKEMIA. - THROMBOCYTOPENIA.    08/11/2016 Pathology Results    Bone Marrow Flow Cytometry - CHRONIC LYMPHOCYTIC LEUKEMIA/SMALL LYMPHOCYTIC LYMPHOMA.    08/16/2016 Pathology Results    Normal cytogenetics.  Molecular cytogenetic analysis by [facility] demonstrates a 13q-    08/19/2016 -  Chemotherapy    Imbruvica 420 mg daily        Problem List Patient Active Problem List   Diagnosis Date Noted  . AKI (acute kidney injury) ([place]) [N17.9] 12/17/2017  . Hypertension [I10]   . Subdural hematoma ([place]) [S06.5X9A] 10/12/2016  . A-fib ([place]) [I48.91] 10/12/2016  . Hypoxemia [R09.02] 05/13/2016  . Preventative health care [Z00.00] 03/06/2016  . CLL (chronic lymphocytic leukemia) ([place]) [C91.90] 03/09/2015    Past Medical History Past Medical History:  Diagnosis Date  . CKD (chronic kidney disease), stage IV ([place])   . CLL (chronic lymphocytic leukemia) ([place])   . Hypertension   . NICM (nonischemic cardiomyopathy) ([place])    a. cath 2007, EF 35%, minimal CAD. b. EF normal 2018.  Marland Kitchen PAF (paroxysmal atrial fibrillation) ([place])   . Pinched nerve in neck   . SDH (subdural hematoma) ([place])   . Thrombocytopenia [facility])     Past Surgical History Past Surgical History:  Procedure Laterality Date  . CATARACT EXTRACTION, BILATERAL    . COLONOSCOPY  07/10/2011   Procedure: COLONOSCOPY;  Surgeon: Dorothyann Peng, MD;  Location[facility];  Service: Endoscopy;  Laterality: N/A;  10:30 AM  . CRANIOTOMY N/A 10/14/2016   Procedure: CRANIOTOMY HEMATOMA EVACUATION SUBDURAL;  Surgeon: Jovita Gamma, MD;  Location: [facility]  OR;  Service: Neurosurgery;  Laterality: N/A;  CRANIOTOMY HEMATOMA EVACUATION SUBDURAL  . THYROIDECTOMY, PARTIAL    . TONSILLECTOMY      Family History Family History  Problem Relation Age of Onset  . CAD Father 88  . Cancer Mother   . Alzheimer's disease Mother   . Colon cancer Neg Hx      Social History  reports that he quit smoking about 47 years ago. He has a 15.00 pack-year smoking history. He has never used smokeless tobacco. He reports that he does not drink alcohol or use drugs.  Medications  Current Outpatient Medications:  .  carvedilol (COREG) 3.125 MG tablet, Take 3.125 mg by mouth 2 (two) times daily. , Disp: , Rfl:  .  chlorambucil (LEUKERAN) 2 MG tablet, Take 15 tablets ('30mg'$  total) on Day  1 and Day 15. Give on an empty stomach 1 hour before or 2 hours after meals., Disp: 30 tablet, Rfl: 0 .  Coenzyme Q10 (COQ-10) 200 MG CAPS, Take 400 mg by mouth every evening., Disp: , Rfl:  .  Multiple Vitamin (MULITIVITAMIN WITH MINERALS) TABS, Take 1 tablet by mouth daily., Disp: , Rfl:  .  predniSONE (DELTASONE) 20 MG tablet, TAKE 2 TABLETS BY MOUTH ONCE DAILY FOR 14 DAYS THEN 1 & 1 2 (ONE & ONE HALF) TABS ONCE DAILY FOR 7 DAYS THEN 1 TAB ONCE DAILY STARTING DAY 2, Disp: , Rfl: 0 .  sodium bicarbonate 650 MG tablet, Take 1 tablet (650 mg total) by mouth 2 (two) times daily., Disp: 60 tablet, Rfl: 0 .  tamsulosin (FLOMAX) 0.4 MG CAPS capsule, Take 0.4 mg by mouth daily. , Disp: , Rfl:   Allergies Patient has no known allergies.  Review of Systems Review of Systems - Oncology ROS negative   Physical Exam  Vitals Wt Readings from Last 3 Encounters:  02/14/18 215 lb (97.5 kg)  01/31/18 214 lb (97.1 kg)  01/14/18 224 lb (101.6 kg)   Temp Readings from Last 3 Encounters:  01/14/18 97.7 F (36.5 C) (Oral)  12/22/17 97.7 F (36.5 C) (Oral)  12/17/17 98.3 F (36.8 C) (Oral)   BP Readings from Last 3 Encounters:  02/14/18 (!) 113/94  01/31/18 114/64  01/14/18 127/85   Pulse Readings from Last 3 Encounters:  02/14/18 (!) 56  01/31/18 80  01/14/18 81   Constitutional: Well-developed, well-nourished, and in no distress.   HENT: Head: Normocephalic and atraumatic.  Mouth/Throat: No oropharyngeal exudate. Mucosa moist. Eyes: Pupils are equal, round, and reactive to light. Conjunctivae are normal. No scleral icterus.  Neck: Normal range of motion. Neck supple. No JVD present.  Cardiovascular: Normal rate, regular rhythm and normal heart sounds.  Exam reveals no gallop and no friction rub.   No murmur heard. Pulmonary/Chest: Effort normal and breath sounds normal. No respiratory distress. No wheezes.No rales.  Abdominal: Soft. Bowel sounds are normal. No distension. There is no  tenderness. There is no guarding.  Musculoskeletal: No edema or tenderness.  Lymphadenopathy: No palpable cervical, axillary or supraclavicular adenopathy.  Neurological: Alert and oriented to person, place, and time. No cranial nerve deficit.  Skin: Skin is warm and dry. No rash noted. No erythema. No pallor.  Psychiatric: Affect and judgment normal.   Labs No visits with results within 3 Day(s) from this visit.  Latest known visit with results is:  Appointment on 02/11/2018  Component Date Value Ref Range Status  . WBC 02/11/2018 22.3* 4.0 - 10.5 K/uL Final  . RBC 02/11/2018 3.58*  4.22 - 5.81 MIL/uL Final  . Hemoglobin 02/11/2018 11.6* 13.0 - 17.0 g/dL Final  . HCT 02/11/2018 34.4* 39.0 - 52.0 % Final  . MCV 02/11/2018 96.1  78.0 - 100.0 fL Final  . MCH 02/11/2018 32.4  26.0 - 34.0 pg Final  . MCHC 02/11/2018 33.7  30.0 - 36.0 g/dL Final  . RDW 02/11/2018 14.9  11.5 - 15.5 % Final  . Platelets 02/11/2018 65* 150 - 400 K/uL Final   Comment: PLATELET COUNT CONFIRMED BY SMEAR SPECIMEN CHECKED FOR CLOTS   . Neutrophils Relative % 02/11/2018 18  % Final  . Neutro Abs 02/11/2018 3.9  1.7 - 7.7 K/uL Final  . Lymphocytes Relative 02/11/2018 79  % Final  . Lymphs Abs 02/11/2018 17.8  0.7 - 4.0 K/uL Final  . Monocytes Relative 02/11/2018 2  % Final  . Monocytes Absolute 02/11/2018 0.4  0.1 - 1.0 K/uL Final  . Eosinophils Relative 02/11/2018 1  % Final  . Eosinophils Absolute 02/11/2018 0.2  0.0 - 0.7 K/uL Final  . Basophils Relative 02/11/2018 0  % Final  . Basophils Absolute 02/11/2018 0.0  0.0 - 0.1 K/uL Final  . WBC Morphology 02/11/2018 ATYPICAL LYMPHOCYTES   Final   Comment: SMUDGE CELLS ABSOLUTE LYMPHOCYTOSIS Performed at [facility], [address], [place], [place] [address]   . Sodium 02/11/2018 140  135 - 145 mmol/L Final  . Potassium 02/11/2018 3.9  3.5 - 5.1 mmol/L Final  . Chloride 02/11/2018 110  98 - 111 mmol/L Final  . CO2 02/11/2018 22  22 - 32 mmol/L Final  .  Glucose, Bld 02/11/2018 101* 70 - 99 mg/dL Final  . BUN 02/11/2018 56* 8 - 23 mg/dL Final  . Creatinine, Ser 02/11/2018 5.13* 0.61 - 1.24 mg/dL Final  . Calcium 02/11/2018 9.1  8.9 - 10.3 mg/dL Final  . Total Protein 02/11/2018 6.6  6.5 - 8.1 g/dL Final  . Albumin 02/11/2018 4.5  3.5 - 5.0 g/dL Final  . AST 02/11/2018 18  15 - 41 U/L Final  . ALT 02/11/2018 14  0 - 44 U/L Final  . Alkaline Phosphatase 02/11/2018 40  38 - 126 U/L Final  . Total Bilirubin 02/11/2018 1.0  0.3 - 1.2 mg/dL Final  . GFR calc non Af Amer 02/11/2018 10* >60 mL/min Final  . GFR calc Af Amer 02/11/2018 11* >60 mL/min Final   Comment: (NOTE) The eGFR has been calculated using the CKD EPI equation. This calculation has not been validated in all clinical situations. eGFR's persistently <60 mL/min signify possible Chronic Kidney Disease.   Georgiann Hahn gap 02/11/2018 8  5 - 15 Final   Performed at [facility], [address], [place], [place] [address]  . LDH 02/11/2018 105  98 - 192 U/L Final   Performed at [facility], [address], [place], [place] [address]  . Calcium, Ionized, Serum 02/11/2018 4.9  4.5 - 5.6 mg/dL Final   Comment: (NOTE) Performed At: [facility] [place], [place] [id number] Rush Farmer MD BS:[id number]   . Uric Acid, Serum 02/11/2018 6.8  3.7 - 8.6 mg/dL Final   Performed at [facility], [address], [place], [place] [address]     Pathology Orders Placed This Encounter  Procedures  . CBC with Differential/Platelet    Standing Status:   Future    Standing Expiration Date:   02/15/2019  . Comprehensive metabolic panel    Standing Status:   Future    Standing Expiration Date:  02/15/2019  . Lactate dehydrogenase    Standing Status:   Future    Standing Expiration Date:   02/15/2019       Zoila Shutter MD

## 2018-02-14 NOTE — Progress Notes (Signed)
Kandra Nicolas tolerated influenza vaccine into left deltiod without incident or complaint. Site clean and dry, bandaid applied. Discharged self ambulatory in satisfactory condition.

## 2018-02-14 NOTE — Patient Instructions (Signed)
[place] at [facility] Discharge Instructions  You saw Dr. Walden Field today. Follow up in 4 weeks with labs.   Thank you for choosing [place] at [facility] to provide your oncology and hematology care.  To afford each patient quality time with our provider, please arrive at least 15 minutes before your scheduled appointment time.   If you have a lab appointment with the [facility] please come in thru the  Main Entrance and check in at the main information desk  You need to re-schedule your appointment should you arrive 10 or more minutes late.  We strive to give you quality time with our providers, and arriving late affects you and other patients whose appointments are after yours.  Also, if you no show three or more times for appointments you may be dismissed from the clinic at the providers discretion.     Again, thank you for choosing [facility].  Our hope is that these requests will decrease the amount of time that you wait before being seen by our physicians.       _____________________________________________________________  Should you have questions after your visit to [facility], please contact our office at [phone] between the hours of 8:00 a.m. and 4:30 p.m.  Voicemails left after 4:00 p.m. will not be returned until the following business day.  For prescription refill requests, have your pharmacy contact our office and allow 72 hours.    Cancer Center Support Programs:   > Cancer Support Group  2nd Tuesday of the month 1pm-2pm, Journey Room

## 2018-02-21 DIAGNOSIS — X32XXXD Exposure to sunlight, subsequent encounter: Secondary | ICD-10-CM | POA: Diagnosis not present

## 2018-02-21 DIAGNOSIS — I4891 Unspecified atrial fibrillation: Secondary | ICD-10-CM | POA: Diagnosis not present

## 2018-02-21 DIAGNOSIS — N179 Acute kidney failure, unspecified: Secondary | ICD-10-CM | POA: Diagnosis not present

## 2018-02-21 DIAGNOSIS — I129 Hypertensive chronic kidney disease with stage 1 through stage 4 chronic kidney disease, or unspecified chronic kidney disease: Secondary | ICD-10-CM | POA: Diagnosis not present

## 2018-02-21 DIAGNOSIS — I701 Atherosclerosis of renal artery: Secondary | ICD-10-CM | POA: Diagnosis not present

## 2018-02-21 DIAGNOSIS — Z85828 Personal history of other malignant neoplasm of skin: Secondary | ICD-10-CM | POA: Diagnosis not present

## 2018-02-21 DIAGNOSIS — Z08 Encounter for follow-up examination after completed treatment for malignant neoplasm: Secondary | ICD-10-CM | POA: Diagnosis not present

## 2018-02-21 DIAGNOSIS — D696 Thrombocytopenia, unspecified: Secondary | ICD-10-CM | POA: Diagnosis not present

## 2018-02-21 DIAGNOSIS — N200 Calculus of kidney: Secondary | ICD-10-CM | POA: Diagnosis not present

## 2018-02-21 DIAGNOSIS — N183 Chronic kidney disease, stage 3 (moderate): Secondary | ICD-10-CM | POA: Diagnosis not present

## 2018-02-21 DIAGNOSIS — C9190 Lymphoid leukemia, unspecified not having achieved remission: Secondary | ICD-10-CM | POA: Diagnosis not present

## 2018-02-21 DIAGNOSIS — L570 Actinic keratosis: Secondary | ICD-10-CM | POA: Diagnosis not present

## 2018-02-21 DIAGNOSIS — E785 Hyperlipidemia, unspecified: Secondary | ICD-10-CM | POA: Diagnosis not present

## 2018-03-04 ENCOUNTER — Other Ambulatory Visit (HOSPITAL_COMMUNITY): Payer: Self-pay | Admitting: *Deleted

## 2018-03-04 DIAGNOSIS — C9110 Chronic lymphocytic leukemia of B-cell type not having achieved remission: Secondary | ICD-10-CM

## 2018-03-04 MED ORDER — CHLORAMBUCIL 2 MG PO TABS: ORAL_TABLET | ORAL | 0 refills | Status: DC

## 2018-03-04 NOTE — Telephone Encounter (Signed)
Chart reviewed, lukeran refilled.

## 2018-03-11 ENCOUNTER — Inpatient Hospital Stay (HOSPITAL_COMMUNITY): Payer: Medicare HMO | Attending: Hematology

## 2018-03-11 DIAGNOSIS — N184 Chronic kidney disease, stage 4 (severe): Secondary | ICD-10-CM | POA: Diagnosis not present

## 2018-03-11 DIAGNOSIS — I4891 Unspecified atrial fibrillation: Secondary | ICD-10-CM | POA: Diagnosis not present

## 2018-03-11 DIAGNOSIS — R161 Splenomegaly, not elsewhere classified: Secondary | ICD-10-CM | POA: Insufficient documentation

## 2018-03-11 DIAGNOSIS — Z87891 Personal history of nicotine dependence: Secondary | ICD-10-CM | POA: Diagnosis not present

## 2018-03-11 DIAGNOSIS — D696 Thrombocytopenia, unspecified: Secondary | ICD-10-CM | POA: Insufficient documentation

## 2018-03-11 DIAGNOSIS — I129 Hypertensive chronic kidney disease with stage 1 through stage 4 chronic kidney disease, or unspecified chronic kidney disease: Secondary | ICD-10-CM | POA: Insufficient documentation

## 2018-03-11 DIAGNOSIS — Z79899 Other long term (current) drug therapy: Secondary | ICD-10-CM | POA: Insufficient documentation

## 2018-03-11 DIAGNOSIS — I480 Paroxysmal atrial fibrillation: Secondary | ICD-10-CM | POA: Insufficient documentation

## 2018-03-11 DIAGNOSIS — R590 Localized enlarged lymph nodes: Secondary | ICD-10-CM | POA: Diagnosis not present

## 2018-03-11 DIAGNOSIS — C9110 Chronic lymphocytic leukemia of B-cell type not having achieved remission: Secondary | ICD-10-CM | POA: Diagnosis present

## 2018-03-11 DIAGNOSIS — I428 Other cardiomyopathies: Secondary | ICD-10-CM | POA: Diagnosis not present

## 2018-03-11 LAB — CBC WITH DIFFERENTIAL/PLATELET
ABS IMMATURE GRANULOCYTES: 0.05 10*3/uL (ref 0.00–0.07)
Basophils Absolute: 0 10*3/uL (ref 0.0–0.1)
Basophils Relative: 0 %
EOS PCT: 0 %
Eosinophils Absolute: 0 10*3/uL (ref 0.0–0.5)
HEMATOCRIT: 37.1 % — AB (ref 39.0–52.0)
HEMOGLOBIN: 12 g/dL — AB (ref 13.0–17.0)
Immature Granulocytes: 0 %
LYMPHS PCT: 79 %
Lymphs Abs: 22 10*3/uL — ABNORMAL HIGH (ref 0.7–4.0)
MCH: 32.1 pg (ref 26.0–34.0)
MCHC: 32.3 g/dL (ref 30.0–36.0)
MCV: 99.2 fL (ref 80.0–100.0)
MONO ABS: 0.5 10*3/uL (ref 0.1–1.0)
Monocytes Relative: 2 %
Neutro Abs: 5.1 10*3/uL (ref 1.7–7.7)
Neutrophils Relative %: 19 %
Platelets: 87 10*3/uL — ABNORMAL LOW (ref 150–400)
RBC: 3.74 MIL/uL — AB (ref 4.22–5.81)
RDW: 13.8 % (ref 11.5–15.5)
WBC: 27.7 10*3/uL — AB (ref 4.0–10.5)
nRBC: 0 % (ref 0.0–0.2)

## 2018-03-11 LAB — COMPREHENSIVE METABOLIC PANEL
ALBUMIN: 4.1 g/dL (ref 3.5–5.0)
ALK PHOS: 32 U/L — AB (ref 38–126)
ALT: 18 U/L (ref 0–44)
AST: 16 U/L (ref 15–41)
Anion gap: 8 (ref 5–15)
BILIRUBIN TOTAL: 1.3 mg/dL — AB (ref 0.3–1.2)
BUN: 48 mg/dL — AB (ref 8–23)
CALCIUM: 9.2 mg/dL (ref 8.9–10.3)
CO2: 23 mmol/L (ref 22–32)
CREATININE: 3.08 mg/dL — AB (ref 0.61–1.24)
Chloride: 107 mmol/L (ref 98–111)
GFR calc Af Amer: 21 mL/min — ABNORMAL LOW (ref >60)
GFR calc non Af Amer: 18 mL/min — ABNORMAL LOW (ref >60)
GLUCOSE: 87 mg/dL (ref 70–99)
Potassium: 3.9 mmol/L (ref 3.5–5.1)
Sodium: 138 mmol/L (ref 135–145)
TOTAL PROTEIN: 6 g/dL — AB (ref 6.5–8.1)

## 2018-03-11 LAB — URIC ACID: Uric Acid, Serum: 5 mg/dL (ref 3.7–8.6)

## 2018-03-11 LAB — LACTATE DEHYDROGENASE: LDH: 111 U/L (ref 98–192)

## 2018-03-14 ENCOUNTER — Other Ambulatory Visit: Payer: Self-pay

## 2018-03-14 ENCOUNTER — Inpatient Hospital Stay (HOSPITAL_BASED_OUTPATIENT_CLINIC_OR_DEPARTMENT_OTHER): Payer: Medicare HMO | Admitting: Internal Medicine

## 2018-03-14 ENCOUNTER — Encounter (HOSPITAL_COMMUNITY): Payer: Self-pay | Admitting: Internal Medicine

## 2018-03-14 VITALS — BP 142/97 | HR 92 | Temp 97.8°F | Resp 18 | Wt 218.0 lb

## 2018-03-14 DIAGNOSIS — R590 Localized enlarged lymph nodes: Secondary | ICD-10-CM | POA: Diagnosis not present

## 2018-03-14 DIAGNOSIS — L820 Inflamed seborrheic keratosis: Secondary | ICD-10-CM | POA: Diagnosis not present

## 2018-03-14 DIAGNOSIS — I480 Paroxysmal atrial fibrillation: Secondary | ICD-10-CM | POA: Diagnosis not present

## 2018-03-14 DIAGNOSIS — X32XXXD Exposure to sunlight, subsequent encounter: Secondary | ICD-10-CM | POA: Diagnosis not present

## 2018-03-14 DIAGNOSIS — Z79899 Other long term (current) drug therapy: Secondary | ICD-10-CM | POA: Diagnosis not present

## 2018-03-14 DIAGNOSIS — R161 Splenomegaly, not elsewhere classified: Secondary | ICD-10-CM | POA: Diagnosis not present

## 2018-03-14 DIAGNOSIS — D696 Thrombocytopenia, unspecified: Secondary | ICD-10-CM

## 2018-03-14 DIAGNOSIS — L570 Actinic keratosis: Secondary | ICD-10-CM | POA: Diagnosis not present

## 2018-03-14 DIAGNOSIS — Z87891 Personal history of nicotine dependence: Secondary | ICD-10-CM | POA: Diagnosis not present

## 2018-03-14 DIAGNOSIS — I428 Other cardiomyopathies: Secondary | ICD-10-CM | POA: Diagnosis not present

## 2018-03-14 DIAGNOSIS — I4891 Unspecified atrial fibrillation: Secondary | ICD-10-CM | POA: Diagnosis not present

## 2018-03-14 DIAGNOSIS — N184 Chronic kidney disease, stage 4 (severe): Secondary | ICD-10-CM | POA: Diagnosis not present

## 2018-03-14 DIAGNOSIS — C9110 Chronic lymphocytic leukemia of B-cell type not having achieved remission: Secondary | ICD-10-CM

## 2018-03-14 DIAGNOSIS — I129 Hypertensive chronic kidney disease with stage 1 through stage 4 chronic kidney disease, or unspecified chronic kidney disease: Secondary | ICD-10-CM

## 2018-03-14 DIAGNOSIS — Z08 Encounter for follow-up examination after completed treatment for malignant neoplasm: Secondary | ICD-10-CM | POA: Diagnosis not present

## 2018-03-14 DIAGNOSIS — Z85828 Personal history of other malignant neoplasm of skin: Secondary | ICD-10-CM | POA: Diagnosis not present

## 2018-03-14 NOTE — Progress Notes (Signed)
Diagnosis CLL (chronic lymphocytic leukemia) ([place]) - Plan: CBC with Differential/Platelet, Comprehensive metabolic panel, Lactate dehydrogenase, Uric acid  Staging Cancer Staging CLL (chronic lymphocytic leukemia) (HCC) Staging form: Chronic Lymphocytic Leukemia / Small Lymphocytic Lymphoma, AJCC 8th Edition - Clinical stage from 09/04/2016: Modified Rai Stage IV (Binet: Stage C, Modified Rai risk: High, Lymphocytosis: Present, Adenopathy: Present, Organomegaly: Present, Anemia: Absent, Thrombocytopenia: Present) - Signed by Baird Cancer, PA-C on 09/04/2016   Assessment and Plan:    1.   CLL.  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 Pt was previously followed by Dr. Talbert Cage.  CT neck/chest/abd/pelvis on 08/03/16 showed progressive adenopathy. 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 Progressive thrombocytopenia appreciated with platelets <100,000. Treatment initiated with Imbruvica 420 mg daily on 08/19/16.  Imbruvica stopped on 10/11/16 for subdural hematoma possibly secondary to fall in January 2018 vs. Effects of imbruvica.  Pt had previous discussion with Dr. Talbert Cage regarding recommendations due to discontinuation of  imbruvica permanently in light of the subdural hematoma.     11/05/2017 showed WBC 99.9 which is increased from 82.6 on labs done in 07/2017.  Plt count was also decreased at 97,000 and was previously 111,000.  He has stable lymphocyte count at 94%.   Thrombocytopenia had worsened and is now less than 100,000.  WBC has also increased.  Pt remains asymptomatic.    PET scan for restaging evaluation of adenopathy which was chosen due to worsening renal function.   Pet scan done 11/15/2017 showed  IMPRESSION: Diffuse hypermetabolic lymphadenopathy throughout the neck, chest, abdomen, and pelvis, without significant change in size compared to prior CT on 08/03/2016.  No new or progressive lymphadenopathy identified.  Previously options of therapy discussed with  bendamustin  + rituxan or chlorambucil + obinutuzumab. Patient would prefer an oral regimen.    He was provided written information about Chlorambucil and obinutuzumab. He does not desire to take obinutuzumab due to it being an infusional agent.  Chlorambucil originally dosed at 0.5 mg/kg D1 and D15.   Dose reduction due to RI.  He was started on 40 mg D1 and D15.  Due to worsening renal function dosing is now 30 mg D1 and D15.  Will continue present dose.    Hep B and C and HIV was checked prior to therapy initiation and all testing was negative.   Labs done 03/11/2018 reviewed and showed WBC 27.7 HB 12 plts 87,000.  Chemistries WNL with K+ 3.8 Cr 3 and normal LFTs.  Uric Acid 5.  Pt will RTC in 4 weeks for follow-up and repeat labs.  Plt count is improving and Cr is improved.   2.  CKD.  Labs done 03/11/2018 reviewed and showed WBC 27.7 HB 12 plts 87,000.  Chemistries WNL with K+ 3.8 Cr 3 and normal LFTs.  Uric Acid 5.  Pt will RTC in 4 weeks for follow-up and repeat labs.  Plt count is improving and Cr is improved.   Pt now on prednisone per nephrology.  Pt was treated with rasburicase to decrease risk for TLS.  Continue hydration.  He had kidney biopsy done while hospitalized on 12/19/2017 and path returned as CLL.    3.  TLS prophylaxis.  Pt was treated with Rasburicase.    UA is WNL at 5 on labs done 03/11/2018.  Will continue to follow due to kidney involvement with CLL.  Pt no longer on allopurinol.    4.  Subdural hematoma s/p craniotomy/Headaches: Follow-up with Dr. Sherwood Gambler, neurosurgery.  5.  HTN.  BP is 142/97.    Follow-up with PCP.    6.  Health maintenance.  Follow-up with PCP and GI as recommended.    25  minutes spent with more than 50% spent in counseling and coordination of care.    Interval History:  Historical data obtained from note dated 11/08/2017.  CLL.  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 Zap 70 was 7.  Pt was previously followed by Dr. Talbert Cage.  CT neck/chest/abd/pelvis on  08/03/16 showed progressive adenopathy. 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 Progressive thrombocytopenia appreciated with platelets <100,000. Treatment initiated with Imbruvica 420 mg daily on 08/19/16.  Imbruvica stopped on 10/11/16 for subdural hematoma possibly secondary to fall in January 2018 vs. Effects of imbruvica.  Pt had previous discussion with Dr. Talbert Cage regarding treatment recommendations due to discontinuation of  imbruvica permanently in light of the subdural hematoma.    Current Status:  Pt is seen today for follow-up.  He reports he is doing well.  Denies any fevers, chills, night sweats and has noted on adenopathy. He is on prednisone per nephrology.  Pt is here to go over labs.      CLL (chronic lymphocytic leukemia) ([place])   03/09/2015 Initial Diagnosis    CLL (chronic lymphocytic leukemia) ([place])    08/03/2016 Imaging    CT neck- 1. Diffuse and bulky lymphadenopathy in keeping with history of CLL. Nodal enlargement has diffusely progressed since April 2016. 2. Chest and abdomen CT reported separately.    08/03/2016 Imaging    CT CAP- 1. Bulky lymphadenopathy in the chest, abdomen, and pelvis as described. 2. Splenomegaly. 3. Coronary artery and thoracoabdominal aortic atherosclerosis. 4. Prostatomegaly. 5. Several tiny lucent lesions in the bony anatomic pelvis, nonspecific, but attention on follow-up imaging recommended.    08/10/2016 Procedure    Bone marrow aspiration and biopsy by IR.    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 PERIPHERAL BLOOD: - CHRONIC LYMPHOCYTIC LEUKEMIA. - THROMBOCYTOPENIA.    08/11/2016 Pathology Results    Bone Marrow Flow Cytometry - CHRONIC LYMPHOCYTIC LEUKEMIA/SMALL LYMPHOCYTIC LYMPHOMA.    08/16/2016 Pathology Results    Normal cytogenetics.  Molecular cytogenetic analysis by [facility] demonstrates a  13q-    08/19/2016 -  Chemotherapy    Imbruvica 420 mg daily       Problem List Patient Active Problem List   Diagnosis Date Noted  . AKI (acute kidney injury) ([place]) [N17.9] 12/17/2017  . Hypertension [I10]   . Subdural hematoma ([place]) [S06.5X9A] 10/12/2016  . A-fib ([place]) [I48.91] 10/12/2016  . Hypoxemia [R09.02] 05/13/2016  . Preventative health care [Z00.00] 03/06/2016  . CLL (chronic lymphocytic leukemia) ([place]) [C91.10] 03/09/2015    Past Medical History Past Medical History:  Diagnosis Date  . CKD (chronic kidney disease), stage IV ([place])   . CLL (chronic lymphocytic leukemia) ([place])   . Hypertension   . NICM (nonischemic cardiomyopathy) ([place])    a. cath 2007, EF 35%, minimal CAD. b. EF normal 2018.  Marland Kitchen PAF (paroxysmal atrial fibrillation) ([place])   . Pinched nerve in neck   . SDH (subdural hematoma) ([place])   . Thrombocytopenia [facility])     Past Surgical History Past Surgical History:  Procedure Laterality Date  . CATARACT EXTRACTION, BILATERAL    . COLONOSCOPY  07/10/2011   Procedure: COLONOSCOPY;  Surgeon: Dorothyann Peng, MD;  [facility];  Service:  Endoscopy;  Laterality: N/A;  10:30 AM  . CRANIOTOMY N/A 10/14/2016   Procedure: CRANIOTOMY HEMATOMA EVACUATION SUBDURAL;  Surgeon: Jovita Gamma, MD;  Location: [facility];  Service: Neurosurgery;  Laterality: N/A;  CRANIOTOMY HEMATOMA EVACUATION SUBDURAL  . THYROIDECTOMY, PARTIAL    . TONSILLECTOMY      Family History Family History  Problem Relation Age of Onset  . CAD Father 3  . Cancer Mother   . Alzheimer's disease Mother   . Colon cancer Neg Hx      Social History  reports that he quit smoking about 47 years ago. He has a 15.00 pack-year smoking history. He has never used smokeless tobacco. He reports that he does not drink alcohol or use drugs.  Medications  Current Outpatient Medications:  .  carvedilol (COREG) 3.125 MG tablet, Take 3.125 mg by mouth 2 (two) times daily. , Disp: , Rfl:  .   chlorambucil (LEUKERAN) 2 MG tablet, Take 15 tablets (11m total) on Day 1 and Day 15. ( every other week) Give on an empty stomach 1 hour before or 2 hours after meals., Disp: 30 tablet, Rfl: 0 .  Coenzyme Q10 (COQ-10) 200 MG CAPS, Take 400 mg by mouth every evening., Disp: , Rfl:  .  Multiple Vitamin (MULITIVITAMIN WITH MINERALS) TABS, Take 1 tablet by mouth daily., Disp: , Rfl:  .  predniSONE (DELTASONE) 10 MG tablet, Take 10 mg by mouth daily., Disp: , Rfl: 0 .  sodium bicarbonate 650 MG tablet, Take 1 tablet (650 mg total) by mouth 2 (two) times daily., Disp: 60 tablet, Rfl: 0 .  tamsulosin (FLOMAX) 0.4 MG CAPS capsule, Take 0.4 mg by mouth daily. , Disp: , Rfl:   Allergies Patient has no known allergies.  Review of Systems Review of Systems - Oncology ROS negative   Physical Exam  Vitals Wt Readings from Last 3 Encounters:  03/14/18 218 lb (98.9 kg)  02/14/18 215 lb (97.5 kg)  01/31/18 214 lb (97.1 kg)   Temp Readings from Last 3 Encounters:  03/14/18 97.8 F (36.6 C) (Oral)  01/14/18 97.7 F (36.5 C) (Oral)  12/22/17 97.7 F (36.5 C) (Oral)   BP Readings from Last 3 Encounters:  03/14/18 (!) 142/97  02/14/18 (!) 113/94  01/31/18 114/64   Pulse Readings from Last 3 Encounters:  03/14/18 92  02/14/18 (!) 56  01/31/18 80   Constitutional: Well-developed, well-nourished, and in no distress.   HENT: Head: Normocephalic and atraumatic.  Mouth/Throat: No oropharyngeal exudate. Mucosa moist. Eyes: Pupils are equal, round, and reactive to light. Conjunctivae are normal. No scleral icterus.  Neck: Normal range of motion. Neck supple. No JVD present.  Cardiovascular: Normal rate, regular rhythm and normal heart sounds.  Exam reveals no gallop and no friction rub.   No murmur heard. Pulmonary/Chest: Effort normal and breath sounds normal. No respiratory distress. No wheezes.No rales.  Abdominal: Soft. Bowel sounds are normal. No distension. There is no tenderness. There is  no guarding.  Musculoskeletal: No edema or tenderness.  Lymphadenopathy: No cervical, axillary  or supraclavicular adenopathy.  Neurological: Alert and oriented to person, place, and time. No cranial nerve deficit.  Skin: Skin is warm and dry. No rash noted. No erythema. No pallor.  Psychiatric: Affect and judgment normal.   Labs No visits with results within 3 Day(s) from this visit.  Latest known visit with results is:  Appointment on 03/11/2018  Component Date Value Ref Range Status  . WBC 03/11/2018 27.7* 4.0 - 10.5 K/uL Final  .  RBC 03/11/2018 3.74* 4.22 - 5.81 MIL/uL Final  . Hemoglobin 03/11/2018 12.0* 13.0 - 17.0 g/dL Final  . HCT 03/11/2018 37.1* 39.0 - 52.0 % Final  . MCV 03/11/2018 99.2  80.0 - 100.0 fL Final  . MCH 03/11/2018 32.1  26.0 - 34.0 pg Final  . MCHC 03/11/2018 32.3  30.0 - 36.0 g/dL Final  . RDW 03/11/2018 13.8  11.5 - 15.5 % Final  . Platelets 03/11/2018 87* 150 - 400 K/uL Final   Comment: PLATELET COUNT CONFIRMED BY SMEAR SPECIMEN CHECKED FOR CLOTS Immature Platelet Fraction may be clinically indicated, consider ordering this additional test [id number]   . nRBC 03/11/2018 0.0  0.0 - 0.2 % Final  . Neutrophils Relative % 03/11/2018 19  % Final  . Neutro Abs 03/11/2018 5.1  1.7 - 7.7 K/uL Final  . Lymphocytes Relative 03/11/2018 79  % Final  . Lymphs Abs 03/11/2018 22.0* 0.7 - 4.0 K/uL Final  . Monocytes Relative 03/11/2018 2  % Final  . Monocytes Absolute 03/11/2018 0.5  0.1 - 1.0 K/uL Final  . Eosinophils Relative 03/11/2018 0  % Final  . Eosinophils Absolute 03/11/2018 0.0  0.0 - 0.5 K/uL Final  . Basophils Relative 03/11/2018 0  % Final  . Basophils Absolute 03/11/2018 0.0  0.0 - 0.1 K/uL Final  . WBC Morphology 03/11/2018 ABSOLUTE LYMPHOCYTOSIS   Final  . Immature Granulocytes 03/11/2018 0  % Final  . Abs Immature Granulocytes 03/11/2018 0.05  0.00 - 0.07 K/uL Final  . Reactive, Benign Lymphocytes 03/11/2018 PRESENT   Final  . Smudge Cells  03/11/2018 PRESENT   Final   Performed at [facility], [address], [place], [place] [address]  . Sodium 03/11/2018 138  135 - 145 mmol/L Final  . Potassium 03/11/2018 3.9  3.5 - 5.1 mmol/L Final  . Chloride 03/11/2018 107  98 - 111 mmol/L Final  . CO2 03/11/2018 23  22 - 32 mmol/L Final  . Glucose, Bld 03/11/2018 87  70 - 99 mg/dL Final  . BUN 03/11/2018 48* 8 - 23 mg/dL Final  . Creatinine, Ser 03/11/2018 3.08* 0.61 - 1.24 mg/dL Final  . Calcium 03/11/2018 9.2  8.9 - 10.3 mg/dL Final  . Total Protein 03/11/2018 6.0* 6.5 - 8.1 g/dL Final  . Albumin 03/11/2018 4.1  3.5 - 5.0 g/dL Final  . AST 03/11/2018 16  15 - 41 U/L Final  . ALT 03/11/2018 18  0 - 44 U/L Final  . Alkaline Phosphatase 03/11/2018 32* 38 - 126 U/L Final  . Total Bilirubin 03/11/2018 1.3* 0.3 - 1.2 mg/dL Final  . GFR calc non Af Amer 03/11/2018 18* >60 mL/min Final  . GFR calc Af Amer 03/11/2018 21* >60 mL/min Final   Comment: (NOTE) The eGFR has been calculated using the CKD EPI equation. This calculation has not been validated in all clinical situations. eGFR's persistently <60 mL/min signify possible Chronic Kidney Disease.   Georgiann Hahn gap 03/11/2018 8  5 - 15 Final   Performed at [facility], [address], [place], [place] [address]  . LDH 03/11/2018 111  98 - 192 U/L Final   Performed at [facility], [address] [place], [place] [address]  . Uric Acid, Serum 03/11/2018 5.0  3.7 - 8.6 mg/dL Final   Performed at [facility], [address], [place],  [address]     Pathology Orders Placed This Encounter  Procedures  . CBC with Differential/Platelet    Standing Status:   Future  Standing Expiration Date:   03/15/2019  . Comprehensive metabolic panel    Standing Status:   Future    Standing Expiration Date:   03/15/2019  . Lactate dehydrogenase    Standing Status:   Future    Standing Expiration Date:   03/15/2019  . Uric acid    Standing Status:   Future    Standing Expiration Date:    03/15/2019       Zoila Shutter MD

## 2018-03-15 ENCOUNTER — Ambulatory Visit (HOSPITAL_COMMUNITY): Payer: Medicare HMO | Admitting: Internal Medicine

## 2018-03-18 ENCOUNTER — Other Ambulatory Visit (HOSPITAL_COMMUNITY): Payer: Self-pay | Admitting: *Deleted

## 2018-03-18 DIAGNOSIS — C9110 Chronic lymphocytic leukemia of B-cell type not having achieved remission: Secondary | ICD-10-CM

## 2018-03-18 MED ORDER — CHLORAMBUCIL 2 MG PO TABS: ORAL_TABLET | ORAL | 0 refills | Status: DC

## 2018-03-18 NOTE — Telephone Encounter (Signed)
Chart reviewed, Leukeran refilled.

## 2018-03-28 DIAGNOSIS — N183 Chronic kidney disease, stage 3 (moderate): Secondary | ICD-10-CM | POA: Diagnosis not present

## 2018-03-28 DIAGNOSIS — C9190 Lymphoid leukemia, unspecified not having achieved remission: Secondary | ICD-10-CM | POA: Diagnosis not present

## 2018-03-28 DIAGNOSIS — E785 Hyperlipidemia, unspecified: Secondary | ICD-10-CM | POA: Diagnosis not present

## 2018-03-28 DIAGNOSIS — I4891 Unspecified atrial fibrillation: Secondary | ICD-10-CM | POA: Diagnosis not present

## 2018-03-28 DIAGNOSIS — I700 Atherosclerosis of aorta: Secondary | ICD-10-CM | POA: Diagnosis not present

## 2018-03-28 DIAGNOSIS — D696 Thrombocytopenia, unspecified: Secondary | ICD-10-CM | POA: Diagnosis not present

## 2018-03-28 DIAGNOSIS — N200 Calculus of kidney: Secondary | ICD-10-CM | POA: Diagnosis not present

## 2018-03-28 DIAGNOSIS — I129 Hypertensive chronic kidney disease with stage 1 through stage 4 chronic kidney disease, or unspecified chronic kidney disease: Secondary | ICD-10-CM | POA: Diagnosis not present

## 2018-03-28 DIAGNOSIS — N179 Acute kidney failure, unspecified: Secondary | ICD-10-CM | POA: Diagnosis not present

## 2018-04-08 ENCOUNTER — Inpatient Hospital Stay (HOSPITAL_COMMUNITY): Payer: Medicare HMO | Attending: Hematology

## 2018-04-08 DIAGNOSIS — Z9221 Personal history of antineoplastic chemotherapy: Secondary | ICD-10-CM | POA: Diagnosis not present

## 2018-04-08 DIAGNOSIS — N184 Chronic kidney disease, stage 4 (severe): Secondary | ICD-10-CM | POA: Insufficient documentation

## 2018-04-08 DIAGNOSIS — C9110 Chronic lymphocytic leukemia of B-cell type not having achieved remission: Secondary | ICD-10-CM | POA: Diagnosis present

## 2018-04-08 DIAGNOSIS — I129 Hypertensive chronic kidney disease with stage 1 through stage 4 chronic kidney disease, or unspecified chronic kidney disease: Secondary | ICD-10-CM | POA: Diagnosis not present

## 2018-04-08 DIAGNOSIS — N189 Chronic kidney disease, unspecified: Secondary | ICD-10-CM | POA: Insufficient documentation

## 2018-04-08 DIAGNOSIS — R51 Headache: Secondary | ICD-10-CM | POA: Diagnosis not present

## 2018-04-08 DIAGNOSIS — R161 Splenomegaly, not elsewhere classified: Secondary | ICD-10-CM | POA: Insufficient documentation

## 2018-04-08 DIAGNOSIS — D696 Thrombocytopenia, unspecified: Secondary | ICD-10-CM | POA: Diagnosis not present

## 2018-04-08 DIAGNOSIS — R590 Localized enlarged lymph nodes: Secondary | ICD-10-CM | POA: Diagnosis not present

## 2018-04-08 DIAGNOSIS — I4891 Unspecified atrial fibrillation: Secondary | ICD-10-CM | POA: Diagnosis not present

## 2018-04-08 LAB — CBC WITH DIFFERENTIAL/PLATELET
Abs Immature Granulocytes: 0.1 10*3/uL — ABNORMAL HIGH (ref 0.00–0.07)
BASOS ABS: 0 10*3/uL (ref 0.0–0.1)
Basophils Relative: 0 %
EOS PCT: 0 %
Eosinophils Absolute: 0.1 10*3/uL (ref 0.0–0.5)
HCT: 35.9 % — ABNORMAL LOW (ref 39.0–52.0)
Hemoglobin: 11.7 g/dL — ABNORMAL LOW (ref 13.0–17.0)
Immature Granulocytes: 1 %
LYMPHS PCT: 72 %
Lymphs Abs: 15.5 10*3/uL — ABNORMAL HIGH (ref 0.7–4.0)
MCH: 31.5 pg (ref 26.0–34.0)
MCHC: 32.6 g/dL (ref 30.0–36.0)
MCV: 96.8 fL (ref 80.0–100.0)
Monocytes Absolute: 0.8 10*3/uL (ref 0.1–1.0)
Monocytes Relative: 4 %
NEUTROS PCT: 23 %
NRBC: 0 % (ref 0.0–0.2)
Neutro Abs: 4.8 10*3/uL (ref 1.7–7.7)
Platelets: 113 10*3/uL — ABNORMAL LOW (ref 150–400)
RBC: 3.71 MIL/uL — AB (ref 4.22–5.81)
RDW: 13.5 % (ref 11.5–15.5)
WBC: 21.2 10*3/uL — AB (ref 4.0–10.5)

## 2018-04-08 LAB — COMPREHENSIVE METABOLIC PANEL
ALBUMIN: 3.8 g/dL (ref 3.5–5.0)
ALT: 16 U/L (ref 0–44)
ANION GAP: 7 (ref 5–15)
AST: 14 U/L — ABNORMAL LOW (ref 15–41)
Alkaline Phosphatase: 41 U/L (ref 38–126)
BUN: 42 mg/dL — ABNORMAL HIGH (ref 8–23)
CHLORIDE: 107 mmol/L (ref 98–111)
CO2: 26 mmol/L (ref 22–32)
Calcium: 9.4 mg/dL (ref 8.9–10.3)
Creatinine, Ser: 3.13 mg/dL — ABNORMAL HIGH (ref 0.61–1.24)
GFR calc Af Amer: 20 mL/min — ABNORMAL LOW (ref >60)
GFR calc non Af Amer: 18 mL/min — ABNORMAL LOW (ref >60)
GLUCOSE: 79 mg/dL (ref 70–99)
POTASSIUM: 3.6 mmol/L (ref 3.5–5.1)
Sodium: 140 mmol/L (ref 135–145)
Total Bilirubin: 0.9 mg/dL (ref 0.3–1.2)
Total Protein: 6.1 g/dL — ABNORMAL LOW (ref 6.5–8.1)

## 2018-04-08 LAB — LACTATE DEHYDROGENASE: LDH: 113 U/L (ref 98–192)

## 2018-04-08 LAB — URIC ACID: URIC ACID, SERUM: 6 mg/dL (ref 3.7–8.6)

## 2018-04-15 ENCOUNTER — Inpatient Hospital Stay (HOSPITAL_BASED_OUTPATIENT_CLINIC_OR_DEPARTMENT_OTHER): Payer: Medicare HMO | Admitting: Internal Medicine

## 2018-04-15 ENCOUNTER — Encounter (HOSPITAL_COMMUNITY): Payer: Self-pay | Admitting: Internal Medicine

## 2018-04-15 ENCOUNTER — Other Ambulatory Visit: Payer: Self-pay

## 2018-04-15 VITALS — BP 138/100 | HR 74 | Temp 97.5°F | Resp 14 | Wt 215.0 lb

## 2018-04-15 DIAGNOSIS — I4891 Unspecified atrial fibrillation: Secondary | ICD-10-CM | POA: Diagnosis not present

## 2018-04-15 DIAGNOSIS — R944 Abnormal results of kidney function studies: Secondary | ICD-10-CM | POA: Diagnosis not present

## 2018-04-15 DIAGNOSIS — I429 Cardiomyopathy, unspecified: Secondary | ICD-10-CM | POA: Diagnosis not present

## 2018-04-15 DIAGNOSIS — Z9221 Personal history of antineoplastic chemotherapy: Secondary | ICD-10-CM

## 2018-04-15 DIAGNOSIS — C9110 Chronic lymphocytic leukemia of B-cell type not having achieved remission: Secondary | ICD-10-CM | POA: Diagnosis not present

## 2018-04-15 DIAGNOSIS — N184 Chronic kidney disease, stage 4 (severe): Secondary | ICD-10-CM

## 2018-04-15 DIAGNOSIS — R590 Localized enlarged lymph nodes: Secondary | ICD-10-CM

## 2018-04-15 DIAGNOSIS — E782 Mixed hyperlipidemia: Secondary | ICD-10-CM | POA: Diagnosis not present

## 2018-04-15 DIAGNOSIS — R161 Splenomegaly, not elsewhere classified: Secondary | ICD-10-CM

## 2018-04-15 DIAGNOSIS — D696 Thrombocytopenia, unspecified: Secondary | ICD-10-CM

## 2018-04-15 DIAGNOSIS — G589 Mononeuropathy, unspecified: Secondary | ICD-10-CM | POA: Diagnosis not present

## 2018-04-15 DIAGNOSIS — I10 Essential (primary) hypertension: Secondary | ICD-10-CM | POA: Diagnosis not present

## 2018-04-15 DIAGNOSIS — R7301 Impaired fasting glucose: Secondary | ICD-10-CM | POA: Diagnosis not present

## 2018-04-15 DIAGNOSIS — N189 Chronic kidney disease, unspecified: Secondary | ICD-10-CM

## 2018-04-15 DIAGNOSIS — R51 Headache: Secondary | ICD-10-CM | POA: Diagnosis not present

## 2018-04-15 DIAGNOSIS — I129 Hypertensive chronic kidney disease with stage 1 through stage 4 chronic kidney disease, or unspecified chronic kidney disease: Secondary | ICD-10-CM | POA: Diagnosis not present

## 2018-04-15 DIAGNOSIS — R001 Bradycardia, unspecified: Secondary | ICD-10-CM | POA: Diagnosis not present

## 2018-04-15 DIAGNOSIS — I482 Chronic atrial fibrillation, unspecified: Secondary | ICD-10-CM | POA: Diagnosis not present

## 2018-04-15 NOTE — Progress Notes (Signed)
Diagnosis CLL (chronic lymphocytic leukemia) ([place]) - Plan: CBC with Differential/Platelet, Comprehensive metabolic panel, Lactate dehydrogenase, Uric acid  Staging Cancer Staging CLL (chronic lymphocytic leukemia) (HCC) Staging form: Chronic Lymphocytic Leukemia / Small Lymphocytic Lymphoma, AJCC 8th Edition - Clinical stage from 09/04/2016: Modified Rai Stage IV (Binet: Stage C, Modified Rai risk: High, Lymphocytosis: Present, Adenopathy: Present, Organomegaly: Present, Anemia: Absent, Thrombocytopenia: Present) - Signed by Baird Cancer, PA-C on 09/04/2016   Assessment and Plan:  1.   CLL.  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 Pt was previously followed by Dr. Talbert Cage.  CT neck/chest/abd/pelvis on 08/03/16 showed progressive adenopathy. 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 Progressive thrombocytopenia with platelets <100,000. Treatment initiated with Imbruvica 420 mg daily on 08/19/16.  Imbruvica stopped on 10/11/16 for subdural hematoma possibly secondary to fall in January 2018 vs. Effects of imbruvica.  Pt had previous discussion with Dr. Talbert Cage regarding recommendations due to discontinuation of  imbruvica permanently in light of the subdural hematoma.     11/05/2017 showed WBC 99.9 which is increased from 82.6 on labs done in 07/2017.  Plt count was also decreased at 97,000 and was previously 111,000.  He has stable lymphocyte count at 94%.   Thrombocytopenia worsened with plt count less than 100,000.  WBC has also increased.  Pt remains asymptomatic.    PET scan for restaging evaluation of adenopathy which was chosen due to worsening renal function.   Pet scan done 11/15/2017 showed  IMPRESSION: Diffuse hypermetabolic lymphadenopathy throughout the neck, chest, abdomen, and pelvis, without significant change in size compared to prior CT on 08/03/2016.  No new or progressive lymphadenopathy identified.  Previously options of therapy discussed with  bendamustin + rituxan or  chlorambucil + obinutuzumab. Patient would prefer an oral regimen.    He was provided written information about Chlorambucil and obinutuzumab. He does not desire to take obinutuzumab due to it being an infusional agent.  Chlorambucil originally dosed at 0.5 mg/kg D1 and D15.   Dose reduction due to RI.  He was started on 40 mg D1 and D15.  Due to worsening renal function dosing is now 30 mg D1 and D15.  Will continue present dose.    Hep B and C and HIV was checked prior to therapy initiation and all testing was negative.   Labs done 04/08/2018 reviewed and showed WBC 21.2 HB 11.7 plts 113,000.  Chemistries WNL with K+ 3.6 normal LFTs. Uric acid 6.  Pt will RTC in 4 weeks for follow-up and repeat labs.  Plt count is improved at 113,000 and WBC improving at 21.2.    2.  CKD.  Labs done 04/08/2018 reviewed and showed Cr stable at 3.13.  Uric acid 6.  Pt now on prednisone per nephrology.  Pt was treated with rasburicase to decrease risk for TLS.  Continue hydration.  He had kidney biopsy done while hospitalized on 12/19/2017 and path returned as CLL.    3.  TLS prophylaxis.  Pt was treated with Rasburicase.    UA is WNL at 6 on labs done 04/08/2018.   Will continue to follow  due to kidney involvement with CLL.  Pt no longer on allopurinol.    4.  Subdural hematoma s/p craniotomy/Headaches: Follow-up with Dr. Sherwood Gambler, neurosurgery.   5.  HTN.  BP is 138/100. Pt advised to follow-up with PCP.    6.  Health maintenance.  Follow-up with PCP and GI as recommended.    25  minutes spent with more  than 50% spent in counseling and coordination of care.    Interval History:  Historical data obtained from note dated 11/08/2017.  CLL.  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 Zap 70 was 7.  Pt was previously followed by Dr. Talbert Cage.  CT neck/chest/abd/pelvis on 08/03/16 showed progressive adenopathy. 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 Progressive thrombocytopenia appreciated with platelets  <100,000. Treatment initiated with Imbruvica 420 mg daily on 08/19/16.  Imbruvica stopped on 10/11/16 for subdural hematoma possibly secondary to fall in January 2018 vs. Effects of imbruvica.  Pt had previous discussion with Dr. Talbert Cage regarding treatment recommendations due to discontinuation of  imbruvica permanently in light of the subdural hematoma.    Current Status:  Pt is seen today for follow-up.  He is here to go over labs.  He denies any fevers, chills, night sweats and has noted on adenopathy. He is on prednisone per nephrology.      CLL (chronic lymphocytic leukemia) ([place])   03/09/2015 Initial Diagnosis    CLL (chronic lymphocytic leukemia) ([place])    08/03/2016 Imaging    CT neck- 1. Diffuse and bulky lymphadenopathy in keeping with history of CLL. Nodal enlargement has diffusely progressed since April 2016. 2. Chest and abdomen CT reported separately.    08/03/2016 Imaging    CT CAP- 1. Bulky lymphadenopathy in the chest, abdomen, and pelvis as described. 2. Splenomegaly. 3. Coronary artery and thoracoabdominal aortic atherosclerosis. 4. Prostatomegaly. 5. Several tiny lucent lesions in the bony anatomic pelvis, nonspecific, but attention on follow-up imaging recommended.    08/10/2016 Procedure    Bone marrow aspiration and biopsy by IR.    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 PERIPHERAL BLOOD: - CHRONIC LYMPHOCYTIC LEUKEMIA. - THROMBOCYTOPENIA.    08/11/2016 Pathology Results    Bone Marrow Flow Cytometry - CHRONIC LYMPHOCYTIC LEUKEMIA/SMALL LYMPHOCYTIC LYMPHOMA.    08/16/2016 Pathology Results    Normal cytogenetics.  Molecular cytogenetic analysis by [facility] demonstrates a 13q-    08/19/2016 -  Chemotherapy    Imbruvica 420 mg daily       Problem List Patient Active Problem List   Diagnosis Date Noted  . AKI (acute kidney injury)  ([place]) [N17.9] 12/17/2017  . Hypertension [I10]   . Subdural hematoma ([place]) [S06.5X9A] 10/12/2016  . A-fib ([place]) [I48.91] 10/12/2016  . Hypoxemia [R09.02] 05/13/2016  . Preventative health care [Z00.00] 03/06/2016  . CLL (chronic lymphocytic leukemia) ([place]) [C91.10] 03/09/2015    Past Medical History Past Medical History:  Diagnosis Date  . CKD (chronic kidney disease), stage IV ([place])   . CLL (chronic lymphocytic leukemia) ([place])   . Hypertension   . NICM (nonischemic cardiomyopathy) ([place])    a. cath 2007, EF 35%, minimal CAD. b. EF normal 2018.  Marland Kitchen PAF (paroxysmal atrial fibrillation) ([place])   . Pinched nerve in neck   . SDH (subdural hematoma) ([place])   . Thrombocytopenia [facility])     Past Surgical History Past Surgical History:  Procedure Laterality Date  . CATARACT EXTRACTION, BILATERAL    . COLONOSCOPY  07/10/2011   Procedure: COLONOSCOPY;  Surgeon: Dorothyann Peng, MD;  [facility];  Service: Endoscopy;  Laterality: N/A;  10:30 AM  . CRANIOTOMY N/A 10/14/2016   Procedure: CRANIOTOMY HEMATOMA EVACUATION SUBDURAL;  Surgeon: Jovita Gamma, MD;  Location: [place];  Service: Neurosurgery;  Laterality: N/A;  CRANIOTOMY HEMATOMA EVACUATION SUBDURAL  . THYROIDECTOMY, PARTIAL    .  TONSILLECTOMY      Family History Family History  Problem Relation Age of Onset  . CAD Father 14  . Cancer Mother   . Alzheimer's disease Mother   . Colon cancer Neg Hx      Social History  reports that he quit smoking about 47 years ago. He has a 15.00 pack-year smoking history. He has never used smokeless tobacco. He reports that he does not drink alcohol or use drugs.  Medications  Current Outpatient Medications:  .  carvedilol (COREG) 3.125 MG tablet, Take 3.125 mg by mouth 2 (two) times daily. , Disp: , Rfl:  .  chlorambucil (LEUKERAN) 2 MG tablet, Take 15 tablets (22m total) on Day 1 and Day 15. ( every other week) Give on an empty stomach 1 hour before or 2 hours after meals., Disp:  30 tablet, Rfl: 0 .  Coenzyme Q10 (COQ-10) 200 MG CAPS, Take 400 mg by mouth every evening., Disp: , Rfl:  .  Multiple Vitamin (MULITIVITAMIN WITH MINERALS) TABS, Take 1 tablet by mouth daily., Disp: , Rfl:  .  predniSONE (DELTASONE) 10 MG tablet, Take 10 mg by mouth daily., Disp: , Rfl: 0 .  sodium bicarbonate 650 MG tablet, Take 1 tablet (650 mg total) by mouth 2 (two) times daily., Disp: 60 tablet, Rfl: 0 .  tamsulosin (FLOMAX) 0.4 MG CAPS capsule, Take 0.4 mg by mouth daily. , Disp: , Rfl:   Allergies Patient has no known allergies.  Review of Systems Review of Systems - Oncology ROS negative   Physical Exam  Vitals Wt Readings from Last 3 Encounters:  04/15/18 215 lb (97.5 kg)  03/14/18 218 lb (98.9 kg)  02/14/18 215 lb (97.5 kg)   Temp Readings from Last 3 Encounters:  04/15/18 (!) 97.5 F (36.4 C) (Oral)  03/14/18 97.8 F (36.6 C) (Oral)  01/14/18 97.7 F (36.5 C) (Oral)   BP Readings from Last 3 Encounters:  04/15/18 (!) 138/100  03/14/18 (!) 142/97  02/14/18 (!) 113/94   Pulse Readings from Last 3 Encounters:  04/15/18 74  03/14/18 92  02/14/18 (!) 56   Constitutional: Well-developed, well-nourished, and in no distress.   HENT: Head: Normocephalic and atraumatic.  Mouth/Throat: No oropharyngeal exudate. Mucosa moist. Eyes: Pupils are equal, round, and reactive to light. Conjunctivae are normal. No scleral icterus.  Neck: Normal range of motion. Neck supple. No JVD present.  Cardiovascular: Normal rate, regular rhythm and normal heart sounds.  Exam reveals no gallop and no friction rub.   No murmur heard. Pulmonary/Chest: Effort normal and breath sounds normal. No respiratory distress. No wheezes.No rales.  Abdominal: Soft. Bowel sounds are normal. No distension. There is no tenderness. There is no guarding.  Musculoskeletal: No edema or tenderness.  Lymphadenopathy: No cervical, axillary or supraclavicular adenopathy.  Neurological: Alert and oriented to  person, place, and time. No cranial nerve deficit.  Skin: Skin is warm and dry. No rash noted. No erythema. No pallor.  Psychiatric: Affect and judgment normal.   Labs No visits with results within 3 Day(s) from this visit.  Latest known visit with results is:  Appointment on 04/08/2018  Component Date Value Ref Range Status  . WBC 04/08/2018 21.2* 4.0 - 10.5 K/uL Final  . RBC 04/08/2018 3.71* 4.22 - 5.81 MIL/uL Final  . Hemoglobin 04/08/2018 11.7* 13.0 - 17.0 g/dL Final  . HCT 04/08/2018 35.9* 39.0 - 52.0 % Final  . MCV 04/08/2018 96.8  80.0 - 100.0 fL Final  . MCH 04/08/2018 31.5  26.0 - 34.0 pg Final  . MCHC 04/08/2018 32.6  30.0 - 36.0 g/dL Final  . RDW 04/08/2018 13.5  11.5 - 15.5 % Final  . Platelets 04/08/2018 113* 150 - 400 K/uL Final   Comment: Immature Platelet Fraction may be clinically indicated, consider ordering this additional test [id number]   . nRBC 04/08/2018 0.0  0.0 - 0.2 % Final  . Neutrophils Relative % 04/08/2018 23  % Final  . Neutro Abs 04/08/2018 4.8  1.7 - 7.7 K/uL Final  . Lymphocytes Relative 04/08/2018 72  % Final  . Lymphs Abs 04/08/2018 15.5* 0.7 - 4.0 K/uL Final  . Monocytes Relative 04/08/2018 4  % Final  . Monocytes Absolute 04/08/2018 0.8  0.1 - 1.0 K/uL Final  . Eosinophils Relative 04/08/2018 0  % Final  . Eosinophils Absolute 04/08/2018 0.1  0.0 - 0.5 K/uL Final  . Basophils Relative 04/08/2018 0  % Final  . Basophils Absolute 04/08/2018 0.0  0.0 - 0.1 K/uL Final  . Immature Granulocytes 04/08/2018 1  % Final  . Abs Immature Granulocytes 04/08/2018 0.10* 0.00 - 0.07 K/uL Final   Performed at [facility], [address], [place], [place] [address]  . Sodium 04/08/2018 140  135 - 145 mmol/L Final  . Potassium 04/08/2018 3.6  3.5 - 5.1 mmol/L Final  . Chloride 04/08/2018 107  98 - 111 mmol/L Final  . CO2 04/08/2018 26  22 - 32 mmol/L Final  . Glucose, Bld 04/08/2018 79  70 - 99 mg/dL Final  . BUN 04/08/2018 42* 8 - 23 mg/dL Final  .  Creatinine, Ser 04/08/2018 3.13* 0.61 - 1.24 mg/dL Final  . Calcium 04/08/2018 9.4  8.9 - 10.3 mg/dL Final  . Total Protein 04/08/2018 6.1* 6.5 - 8.1 g/dL Final  . Albumin 04/08/2018 3.8  3.5 - 5.0 g/dL Final  . AST 04/08/2018 14* 15 - 41 U/L Final  . ALT 04/08/2018 16  0 - 44 U/L Final  . Alkaline Phosphatase 04/08/2018 41  38 - 126 U/L Final  . Total Bilirubin 04/08/2018 0.9  0.3 - 1.2 mg/dL Final  . GFR calc non Af Amer 04/08/2018 18* >60 mL/min Final  . GFR calc Af Amer 04/08/2018 20* >60 mL/min Final   Comment: (NOTE) The eGFR has been calculated using the CKD EPI equation. This calculation has not been validated in all clinical situations. eGFR's persistently <60 mL/min signify possible Chronic Kidney Disease.   Georgiann Hahn gap 04/08/2018 7  5 - 15 Final   Performed at [facility], [address] [place], [place] [address]  . LDH 04/08/2018 113  98 - 192 U/L Final   Performed at [facility], [address], [place], [place] [address]  . Uric Acid, Serum 04/08/2018 6.0  3.7 - 8.6 mg/dL Final   Performed at [facility], [address], [place], [place] [address]     Pathology Orders Placed This Encounter  Procedures  . CBC with Differential/Platelet    Standing Status:   Future    Standing Expiration Date:   05/14/2019  . Comprehensive metabolic panel    Standing Status:   Future    Standing Expiration Date:   05/14/2019  . Lactate dehydrogenase    Standing Status:   Future    Standing Expiration Date:   05/14/2019  . Uric acid    Standing Status:   Future    Standing Expiration Date:   05/14/2019       Zoila Shutter MD

## 2018-04-15 NOTE — Patient Instructions (Signed)
[facility]  Discharge Instructions: You saw Dr. Higgs today                               _______________________________________________________________  Thank you for choosing [facility] to provide your oncology and hematology care.  To afford each patient quality time with our providers, please arrive at least 15 minutes before your scheduled appointment.  You need to re-schedule your appointment if you arrive 10 or more minutes late.  We strive to give you quality time with our providers, and arriving late affects you and other patients whose appointments are after yours.  Also, if you no show three or more times for appointments you may be dismissed from the clinic.  Again, thank you for choosing [facility]. Our hope is that these requests will allow you access to exceptional care and in a timely manner. _______________________________________________________________  If you have questions after your visit, please contact our office at [phone] between the hours of 8:30 a.m. and 5:00 p.m. Voicemails left after 4:30 p.m. will not be returned until the following business day. _______________________________________________________________  For prescription refill requests, have your pharmacy contact our office. _______________________________________________________________  Recommendations made by the consultant and any test results will be sent to your referring physician. _______________________________________________________________ 

## 2018-04-22 DIAGNOSIS — N400 Enlarged prostate without lower urinary tract symptoms: Secondary | ICD-10-CM | POA: Diagnosis not present

## 2018-04-22 DIAGNOSIS — D649 Anemia, unspecified: Secondary | ICD-10-CM | POA: Diagnosis not present

## 2018-04-22 DIAGNOSIS — Z23 Encounter for immunization: Secondary | ICD-10-CM | POA: Diagnosis not present

## 2018-04-22 DIAGNOSIS — I4891 Unspecified atrial fibrillation: Secondary | ICD-10-CM | POA: Diagnosis not present

## 2018-04-22 DIAGNOSIS — R7301 Impaired fasting glucose: Secondary | ICD-10-CM | POA: Diagnosis not present

## 2018-04-22 DIAGNOSIS — E782 Mixed hyperlipidemia: Secondary | ICD-10-CM | POA: Diagnosis not present

## 2018-04-22 DIAGNOSIS — C9110 Chronic lymphocytic leukemia of B-cell type not having achieved remission: Secondary | ICD-10-CM | POA: Diagnosis not present

## 2018-04-22 DIAGNOSIS — D696 Thrombocytopenia, unspecified: Secondary | ICD-10-CM | POA: Diagnosis not present

## 2018-04-22 DIAGNOSIS — I10 Essential (primary) hypertension: Secondary | ICD-10-CM | POA: Diagnosis not present

## 2018-04-22 DIAGNOSIS — R944 Abnormal results of kidney function studies: Secondary | ICD-10-CM | POA: Diagnosis not present

## 2018-05-09 ENCOUNTER — Inpatient Hospital Stay (HOSPITAL_COMMUNITY): Payer: Medicare HMO | Attending: Hematology

## 2018-05-09 DIAGNOSIS — D696 Thrombocytopenia, unspecified: Secondary | ICD-10-CM | POA: Insufficient documentation

## 2018-05-09 DIAGNOSIS — I129 Hypertensive chronic kidney disease with stage 1 through stage 4 chronic kidney disease, or unspecified chronic kidney disease: Secondary | ICD-10-CM | POA: Insufficient documentation

## 2018-05-09 DIAGNOSIS — I480 Paroxysmal atrial fibrillation: Secondary | ICD-10-CM | POA: Diagnosis not present

## 2018-05-09 DIAGNOSIS — N189 Chronic kidney disease, unspecified: Secondary | ICD-10-CM | POA: Insufficient documentation

## 2018-05-09 DIAGNOSIS — Z79899 Other long term (current) drug therapy: Secondary | ICD-10-CM | POA: Insufficient documentation

## 2018-05-09 DIAGNOSIS — Z8669 Personal history of other diseases of the nervous system and sense organs: Secondary | ICD-10-CM | POA: Diagnosis not present

## 2018-05-09 DIAGNOSIS — C9110 Chronic lymphocytic leukemia of B-cell type not having achieved remission: Secondary | ICD-10-CM | POA: Insufficient documentation

## 2018-05-09 DIAGNOSIS — R51 Headache: Secondary | ICD-10-CM | POA: Insufficient documentation

## 2018-05-09 DIAGNOSIS — Z9221 Personal history of antineoplastic chemotherapy: Secondary | ICD-10-CM | POA: Insufficient documentation

## 2018-05-09 DIAGNOSIS — Z87891 Personal history of nicotine dependence: Secondary | ICD-10-CM | POA: Diagnosis not present

## 2018-05-09 LAB — COMPREHENSIVE METABOLIC PANEL
ALT: 15 U/L (ref 0–44)
AST: 15 U/L (ref 15–41)
Albumin: 4.2 g/dL (ref 3.5–5.0)
Alkaline Phosphatase: 35 U/L — ABNORMAL LOW (ref 38–126)
Anion gap: 6 (ref 5–15)
BILIRUBIN TOTAL: 1.3 mg/dL — AB (ref 0.3–1.2)
BUN: 42 mg/dL — AB (ref 8–23)
CALCIUM: 9.2 mg/dL (ref 8.9–10.3)
CO2: 26 mmol/L (ref 22–32)
Chloride: 108 mmol/L (ref 98–111)
Creatinine, Ser: 2.93 mg/dL — ABNORMAL HIGH (ref 0.61–1.24)
GFR calc Af Amer: 23 mL/min — ABNORMAL LOW (ref >60)
GFR calc non Af Amer: 20 mL/min — ABNORMAL LOW (ref >60)
GLUCOSE: 122 mg/dL — AB (ref 70–99)
Potassium: 4.2 mmol/L (ref 3.5–5.1)
Sodium: 140 mmol/L (ref 135–145)
TOTAL PROTEIN: 6.3 g/dL — AB (ref 6.5–8.1)

## 2018-05-09 LAB — CBC WITH DIFFERENTIAL/PLATELET
Abs Immature Granulocytes: 0.04 10*3/uL (ref 0.00–0.07)
BASOS PCT: 0 %
Basophils Absolute: 0 10*3/uL (ref 0.0–0.1)
EOS PCT: 0 %
Eosinophils Absolute: 0.1 10*3/uL (ref 0.0–0.5)
HCT: 40.6 % (ref 39.0–52.0)
Hemoglobin: 13.1 g/dL (ref 13.0–17.0)
Immature Granulocytes: 0 %
LYMPHS ABS: 10.2 10*3/uL — AB (ref 0.7–4.0)
LYMPHS PCT: 66 %
MCH: 32 pg (ref 26.0–34.0)
MCHC: 32.3 g/dL (ref 30.0–36.0)
MCV: 99 fL (ref 80.0–100.0)
Monocytes Absolute: 1.3 10*3/uL — ABNORMAL HIGH (ref 0.1–1.0)
Monocytes Relative: 8 %
NEUTROS PCT: 26 %
NRBC: 0 % (ref 0.0–0.2)
Neutro Abs: 4.2 10*3/uL (ref 1.7–7.7)
PLATELETS: 83 10*3/uL — AB (ref 150–400)
RBC: 4.1 MIL/uL — ABNORMAL LOW (ref 4.22–5.81)
RDW: 13.2 % (ref 11.5–15.5)
WBC: 15.8 10*3/uL — AB (ref 4.0–10.5)

## 2018-05-09 LAB — URIC ACID: Uric Acid, Serum: 5.6 mg/dL (ref 3.7–8.6)

## 2018-05-09 LAB — LACTATE DEHYDROGENASE: LDH: 110 U/L (ref 98–192)

## 2018-05-13 ENCOUNTER — Other Ambulatory Visit: Payer: Self-pay

## 2018-05-13 ENCOUNTER — Inpatient Hospital Stay (HOSPITAL_COMMUNITY): Payer: Medicare HMO | Admitting: Internal Medicine

## 2018-05-13 ENCOUNTER — Encounter (HOSPITAL_COMMUNITY): Payer: Self-pay | Admitting: Internal Medicine

## 2018-05-13 VITALS — BP 134/103 | HR 76 | Temp 97.4°F | Resp 14 | Wt 220.5 lb

## 2018-05-13 DIAGNOSIS — R51 Headache: Secondary | ICD-10-CM | POA: Diagnosis not present

## 2018-05-13 DIAGNOSIS — Z9221 Personal history of antineoplastic chemotherapy: Secondary | ICD-10-CM

## 2018-05-13 DIAGNOSIS — Z8669 Personal history of other diseases of the nervous system and sense organs: Secondary | ICD-10-CM

## 2018-05-13 DIAGNOSIS — D696 Thrombocytopenia, unspecified: Secondary | ICD-10-CM

## 2018-05-13 DIAGNOSIS — I129 Hypertensive chronic kidney disease with stage 1 through stage 4 chronic kidney disease, or unspecified chronic kidney disease: Secondary | ICD-10-CM | POA: Diagnosis not present

## 2018-05-13 DIAGNOSIS — N189 Chronic kidney disease, unspecified: Secondary | ICD-10-CM | POA: Diagnosis not present

## 2018-05-13 DIAGNOSIS — Z87891 Personal history of nicotine dependence: Secondary | ICD-10-CM

## 2018-05-13 DIAGNOSIS — C9110 Chronic lymphocytic leukemia of B-cell type not having achieved remission: Secondary | ICD-10-CM

## 2018-05-13 DIAGNOSIS — I480 Paroxysmal atrial fibrillation: Secondary | ICD-10-CM

## 2018-05-13 DIAGNOSIS — Z79899 Other long term (current) drug therapy: Secondary | ICD-10-CM | POA: Diagnosis not present

## 2018-05-13 NOTE — Progress Notes (Signed)
Diagnosis CLL (chronic lymphocytic leukemia) ([facility]) - Plan: CBC with Differential/Platelet, Comprehensive metabolic panel, Lactate dehydrogenase, Uric acid  Staging Cancer Staging CLL (chronic lymphocytic leukemia) (HCC) Staging form: Chronic Lymphocytic Leukemia / Small Lymphocytic Lymphoma, AJCC 8th Edition - Clinical stage from 09/04/2016: Modified Rai Stage IV (Binet: Stage C, Modified Rai risk: High, Lymphocytosis: Present, Adenopathy: Present, Organomegaly: Present, Anemia: Absent, Thrombocytopenia: Present) - Signed by Baird Cancer, PA-C on 09/04/2016   Assessment and Plan:   1.   CLL.  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 Pt was previously followed by Dr. Talbert Cage.  CT neck/chest/abd/pelvis on 08/03/16 showed progressive adenopathy. 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 Progressive thrombocytopenia with platelets <100,000. Treatment initiated with Imbruvica 420 mg daily on 08/19/16.  Imbruvica stopped on 10/11/16 for subdural hematoma possibly secondary to fall in January 2018 vs. Effects of imbruvica.  Pt had previous discussion with Dr. Talbert Cage regarding recommendations due to discontinuation of  imbruvica permanently in light of the subdural hematoma.    PET scan for restaging evaluation of adenopathy which was chosen due to worsening renal function.   Pet scan done 11/15/2017 showed  IMPRESSION: Diffuse hypermetabolic lymphadenopathy throughout the neck, chest, abdomen, and pelvis, without significant change in size compared to prior CT on 08/03/2016.  No new or progressive lymphadenopathy identified.  Previously options of therapy discussed with  bendamustin + rituxan or chlorambucil + obinutuzumab. Patient would prefer an oral regimen.    He was provided written information about Chlorambucil and obinutuzumab. He does not desire to take obinutuzumab due to it being an infusional agent.  Chlorambucil originally dosed at 0.5 mg/kg D1 and D15.   Dose reduction due to RI.   He was started on 40 mg D1 and D15.  Due to worsening renal function dosing is now 30 mg D1 and D15.  Will continue present dose.    Hep B and C and HIV was checked prior to therapy initiation and all testing was negative.   Labs done 05/09/2018 reviewed and showed WBC 15.8 HB 13.1 plts 83,000.  Chemistries WNL with K+ 4.2 Cr 2.93 and normal LFTs.  Uric acid 5.6.    WBC is improved.  Plts 83,000.  Will continue present dose and repeat labs in 4 weeks for ongoing follow-up.  Will set up for repeat imaging after January visit.    2.  CKD.  Labs done 05/09/2018 reviewed and showed Cr stable at 2.93.   Pt on prednisone per nephrology.  Pt was treated with rasburicase to decrease risk for TLS.  Continue hydration.  He had kidney biopsy done while hospitalized on 12/19/2017 and path returned as CLL.    3.  TLS prophylaxis.  Pt was treated with Rasburicase.    UA remains normal at 5.6 on labs done 05/09/2018.   Will continue to follow  due to kidney involvement with CLL.  Pt no longer on allopurinol.    4.  Subdural hematoma s/p craniotomy/Headaches: Follow-up with Dr. Sherwood Gambler, neurosurgery.   5.  HTN.  BP is 134/103.  Pt reports BP not elevated at PCP visits.  Pt has appointment with Cardiology this week.    6.  Health maintenance.  Follow-up with PCP and GI as recommended.    25  minutes spent with more than 50% spent in counseling and coordination of care.    Interval History:  Historical data obtained from note dated 11/08/2017.  CLL.  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 Zap 70 was  7.  Pt was previously followed by Dr. Talbert Cage.  CT neck/chest/abd/pelvis on 08/03/16 showed progressive adenopathy. 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 Progressive thrombocytopenia appreciated with platelets <100,000. Treatment initiated with Imbruvica 420 mg daily on 08/19/16.  Imbruvica stopped on 10/11/16 for subdural hematoma possibly secondary to fall in January 2018 vs. Effects of imbruvica.  Pt  had previous discussion with Dr. Talbert Cage regarding treatment recommendations due to discontinuation of  imbruvica permanently in light of the subdural hematoma.    Current Status:  Pt is seen today for follow-up.  He is here to go over labs.  He denies any fevers, chills, night sweats and has noted on adenopathy. He remains on prednisone per nephrology.      CLL (chronic lymphocytic leukemia) ([place])   03/09/2015 Initial Diagnosis    CLL (chronic lymphocytic leukemia) ([place])    08/03/2016 Imaging    CT neck- 1. Diffuse and bulky lymphadenopathy in keeping with history of CLL. Nodal enlargement has diffusely progressed since April 2016. 2. Chest and abdomen CT reported separately.    08/03/2016 Imaging    CT CAP- 1. Bulky lymphadenopathy in the chest, abdomen, and pelvis as described. 2. Splenomegaly. 3. Coronary artery and thoracoabdominal aortic atherosclerosis. 4. Prostatomegaly. 5. Several tiny lucent lesions in the bony anatomic pelvis, nonspecific, but attention on follow-up imaging recommended.    08/10/2016 Procedure    Bone marrow aspiration and biopsy by IR.    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 PERIPHERAL BLOOD: - CHRONIC LYMPHOCYTIC LEUKEMIA. - THROMBOCYTOPENIA.    08/11/2016 Pathology Results    Bone Marrow Flow Cytometry - CHRONIC LYMPHOCYTIC LEUKEMIA/SMALL LYMPHOCYTIC LYMPHOMA.    08/16/2016 Pathology Results    Normal cytogenetics.  Molecular cytogenetic analysis by [facility] demonstrates a 13q-    08/19/2016 -  Chemotherapy    Imbruvica 420 mg daily       Problem List Patient Active Problem List   Diagnosis Date Noted  . AKI (acute kidney injury) ([place]) [N17.9] 12/17/2017  . Hypertension [I10]   . Subdural hematoma ([place]) [S06.5X9A] 10/12/2016  . A-fib ([place]) [I48.91] 10/12/2016  . Hypoxemia [R09.02] 05/13/2016  . Preventative health  care [Z00.00] 03/06/2016  . CLL (chronic lymphocytic leukemia) ([place]) [C91.10] 03/09/2015    Past Medical History Past Medical History:  Diagnosis Date  . CKD (chronic kidney disease), stage IV ([place])   . CLL (chronic lymphocytic leukemia) ([place])   . Hypertension   . NICM (nonischemic cardiomyopathy) ([place])    a. cath 2007, EF 35%, minimal CAD. b. EF normal 2018.  Marland Kitchen PAF (paroxysmal atrial fibrillation) ([place])   . Pinched nerve in neck   . SDH (subdural hematoma) ([place])   . Thrombocytopenia [facility])     Past Surgical History Past Surgical History:  Procedure Laterality Date  . CATARACT EXTRACTION, BILATERAL    . COLONOSCOPY  07/10/2011   Procedure: COLONOSCOPY;  Surgeon: Dorothyann Peng, MD;  [facility];  Service: Endoscopy;  Laterality: N/A;  10:30 AM  . CRANIOTOMY N/A 10/14/2016   Procedure: CRANIOTOMY HEMATOMA EVACUATION SUBDURAL;  Surgeon: Jovita Gamma, MD;  Location: [place];  Service: Neurosurgery;  Laterality: N/A;  CRANIOTOMY HEMATOMA EVACUATION SUBDURAL  . THYROIDECTOMY, PARTIAL    . TONSILLECTOMY      Family History Family History  Problem Relation Age of Onset  . CAD Father 58  . Cancer Mother   . Alzheimer's disease Mother   .  Colon cancer Neg Hx      Social History  reports that he quit smoking about 47 years ago. He has a 15.00 pack-year smoking history. He has never used smokeless tobacco. He reports that he does not drink alcohol or use drugs.  Medications  Current Outpatient Medications:  .  carvedilol (COREG) 3.125 MG tablet, Take 3.125 mg by mouth 2 (two) times daily. , Disp: , Rfl:  .  chlorambucil (LEUKERAN) 2 MG tablet, Take 15 tablets (41m total) on Day 1 and Day 15. ( every other week) Give on an empty stomach 1 hour before or 2 hours after meals., Disp: 30 tablet, Rfl: 0 .  Coenzyme Q10 (COQ-10) 200 MG CAPS, Take 400 mg by mouth every evening., Disp: , Rfl:  .  Multiple Vitamin (MULITIVITAMIN WITH MINERALS) TABS, Take 1 tablet by mouth  daily., Disp: , Rfl:  .  predniSONE (DELTASONE) 10 MG tablet, Take 10 mg by mouth daily., Disp: , Rfl: 0 .  sodium bicarbonate 650 MG tablet, Take 1 tablet (650 mg total) by mouth 2 (two) times daily., Disp: 60 tablet, Rfl: 0 .  tamsulosin (FLOMAX) 0.4 MG CAPS capsule, Take 0.4 mg by mouth daily. , Disp: , Rfl:   Allergies Patient has no known allergies.  Review of Systems Review of Systems - Oncology ROS negative   Physical Exam  Vitals Wt Readings from Last 3 Encounters:  05/13/18 220 lb 8 oz (100 kg)  04/15/18 215 lb (97.5 kg)  03/14/18 218 lb (98.9 kg)   Temp Readings from Last 3 Encounters:  05/13/18 (!) 97.4 F (36.3 C) (Oral)  04/15/18 (!) 97.5 F (36.4 C) (Oral)  03/14/18 97.8 F (36.6 C) (Oral)   BP Readings from Last 3 Encounters:  05/13/18 (!) 134/103  04/15/18 (!) 138/100  03/14/18 (!) 142/97   Pulse Readings from Last 3 Encounters:  05/13/18 76  04/15/18 74  03/14/18 92   Constitutional: Well-developed, well-nourished, and in no distress.   HENT: Head: Normocephalic and atraumatic.  Mouth/Throat: No oropharyngeal exudate. Mucosa moist. Eyes: Pupils are equal, round, and reactive to light. Conjunctivae are normal. No scleral icterus.  Neck: Normal range of motion. Neck supple. No JVD present.  Cardiovascular: Normal rate, regular rhythm and normal heart sounds.  Exam reveals no gallop and no friction rub.   No murmur heard. Pulmonary/Chest: Effort normal and breath sounds normal. No respiratory distress. No wheezes.No rales.  Abdominal: Soft. Bowel sounds are normal. No distension. There is no tenderness. There is no guarding.  Musculoskeletal: No edema or tenderness.  Lymphadenopathy: No cervical, axillary or supraclavicular adenopathy.  Neurological: Alert and oriented to person, place, and time. No cranial nerve deficit.  Skin: Skin is warm and dry. No rash noted. No erythema. No pallor.  Psychiatric: Affect and judgment normal.   Labs No visits  with results within 3 Day(s) from this visit.  Latest known visit with results is:  Appointment on 05/09/2018  Component Date Value Ref Range Status  . WBC 05/09/2018 15.8* 4.0 - 10.5 K/uL Final  . RBC 05/09/2018 4.10* 4.22 - 5.81 MIL/uL Final  . Hemoglobin 05/09/2018 13.1  13.0 - 17.0 g/dL Final  . HCT 05/09/2018 40.6  39.0 - 52.0 % Final  . MCV 05/09/2018 99.0  80.0 - 100.0 fL Final  . MCH 05/09/2018 32.0  26.0 - 34.0 pg Final  . MCHC 05/09/2018 32.3  30.0 - 36.0 g/dL Final  . RDW 05/09/2018 13.2  11.5 - 15.5 % Final  .  Platelets 05/09/2018 83* 150 - 400 K/uL Final   Comment: PLATELET COUNT CONFIRMED BY SMEAR SPECIMEN CHECKED FOR CLOTS Immature Platelet Fraction may be clinically indicated, consider ordering this additional test [id number]   . nRBC 05/09/2018 0.0  0.0 - 0.2 % Final  . Neutrophils Relative % 05/09/2018 26  % Final  . Neutro Abs 05/09/2018 4.2  1.7 - 7.7 K/uL Final  . Lymphocytes Relative 05/09/2018 66  % Final  . Lymphs Abs 05/09/2018 10.2* 0.7 - 4.0 K/uL Final  . Monocytes Relative 05/09/2018 8  % Final  . Monocytes Absolute 05/09/2018 1.3* 0.1 - 1.0 K/uL Final  . Eosinophils Relative 05/09/2018 0  % Final  . Eosinophils Absolute 05/09/2018 0.1  0.0 - 0.5 K/uL Final  . Basophils Relative 05/09/2018 0  % Final  . Basophils Absolute 05/09/2018 0.0  0.0 - 0.1 K/uL Final  . WBC Morphology 05/09/2018 ABSOLUTE LYMPHOCYTOSIS   Final  . Immature Granulocytes 05/09/2018 0  % Final  . Abs Immature Granulocytes 05/09/2018 0.04  0.00 - 0.07 K/uL Final  . Reactive, Benign Lymphocytes 05/09/2018 PRESENT   Final  . Smudge Cells 05/09/2018 PRESENT   Final   Performed at [facility], [address] [place], [place] [address]  . Sodium 05/09/2018 140  135 - 145 mmol/L Final  . Potassium 05/09/2018 4.2  3.5 - 5.1 mmol/L Final  . Chloride 05/09/2018 108  98 - 111 mmol/L Final  . CO2 05/09/2018 26  22 - 32 mmol/L Final  . Glucose, Bld 05/09/2018 122* 70 - 99 mg/dL Final  . BUN  05/09/2018 42* 8 - 23 mg/dL Final  . Creatinine, Ser 05/09/2018 2.93* 0.61 - 1.24 mg/dL Final  . Calcium 05/09/2018 9.2  8.9 - 10.3 mg/dL Final  . Total Protein 05/09/2018 6.3* 6.5 - 8.1 g/dL Final  . Albumin 05/09/2018 4.2  3.5 - 5.0 g/dL Final  . AST 05/09/2018 15  15 - 41 U/L Final  . ALT 05/09/2018 15  0 - 44 U/L Final  . Alkaline Phosphatase 05/09/2018 35* 38 - 126 U/L Final  . Total Bilirubin 05/09/2018 1.3* 0.3 - 1.2 mg/dL Final  . GFR calc non Af Amer 05/09/2018 20* >60 mL/min Final  . GFR calc Af Amer 05/09/2018 23* >60 mL/min Final  . Anion gap 05/09/2018 6  5 - 15 Final   Performed at [facility], [address] [place], [place] [address]  . LDH 05/09/2018 110  98 - 192 U/L Final   Performed at [facility], [address], [place], [place] [address]  . Uric Acid, Serum 05/09/2018 5.6  3.7 - 8.6 mg/dL Final   Performed at [facility], [address] [place], [place] [address]     Pathology Orders Placed This Encounter  Procedures  . CBC with Differential/Platelet    Standing Status:   Future    Standing Expiration Date:   05/13/2020  . Comprehensive metabolic panel    Standing Status:   Future    Standing Expiration Date:   05/13/2020  . Lactate dehydrogenase    Standing Status:   Future    Standing Expiration Date:   05/13/2020  . Uric acid    Standing Status:   Future    Standing Expiration Date:   05/13/2020       Zoila Shutter MD

## 2018-05-16 ENCOUNTER — Ambulatory Visit: Payer: Medicare HMO | Admitting: Cardiology

## 2018-05-16 ENCOUNTER — Encounter: Payer: Self-pay | Admitting: Cardiology

## 2018-05-16 VITALS — BP 150/82 | HR 86 | Ht 74.0 in | Wt 221.0 lb

## 2018-05-16 DIAGNOSIS — I10 Essential (primary) hypertension: Secondary | ICD-10-CM

## 2018-05-16 DIAGNOSIS — I480 Paroxysmal atrial fibrillation: Secondary | ICD-10-CM

## 2018-05-16 MED ORDER — CARVEDILOL 6.25 MG PO TABS: 6.2500 mg | ORAL_TABLET | Freq: Two times a day (BID) | ORAL | 3 refills | Status: DC

## 2018-05-16 NOTE — Progress Notes (Signed)
Clinical Summary Andrew [name] is a 77 y.o.male seen today for follow up of the following medical problems.   1. PAF - remote DCCV 15 years ago - per neurosurgery not a candidate for at least 2 months for antiplatelets or anticoag CHA2DS2 VASc score is at least 3, however given his subdural hematoma, he is not a candidate for anticoagulation at this time   - no recent palpitations -he remains compliant with meds   2. 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 CLL - followed by oncology  4. Thrombocytopenia - followed by heme, imbruvica was stopped - platelets remain up and down   5. HTN 11/2017 norvasc, telmisartan, and coreg stopped. Pcp restarted low dose coreg since that time.     6.History of cardiomyopathy -[facility] -he reports being told several year ago that his LVEF was 35%, this was around the time of his first diagnosis of afib. - records are not available at this time - recent echo with normal LVEF 55-60%.  -no recent symptoms.    7. CKD - followed at [place]   Past Medical History:  Diagnosis Date  . CKD (chronic kidney disease), stage IV ([place])   . CLL (chronic lymphocytic leukemia) ([place])   . Hypertension   . NICM (nonischemic cardiomyopathy) ([place])    a. cath 2007, EF 35%, minimal CAD. b. EF normal 2018.  Marland Kitchen PAF (paroxysmal atrial fibrillation) ([place])   . Pinched nerve in neck   . SDH (subdural hematoma) ([place])   . Thrombocytopenia (H[facility])      No Known Allergies   Current Outpatient Medications  Medication Sig Dispense Refill  . carvedilol (COREG) 3.125 MG tablet Take 3.125 mg by mouth 2 (two) times daily.     . chlorambucil (LEUKERAN) 2 MG tablet Take 15 tablets (30mg  total) on Day 1 and Day 15. ( every other week) Give on an empty stomach 1 hour before or 2 hours after meals. 30 tablet 0  . Coenzyme Q10  (COQ-10) 200 MG CAPS Take 400 mg by mouth every evening.    . Multiple Vitamin (MULITIVITAMIN WITH MINERALS) TABS Take 1 tablet by mouth daily.    . predniSONE (DELTASONE) 10 MG tablet Take 10 mg by mouth daily.  0  . sodium bicarbonate 650 MG tablet Take 1 tablet (650 mg total) by mouth 2 (two) times daily. 60 tablet 0  . tamsulosin (FLOMAX) 0.4 MG CAPS capsule Take 0.4 mg by mouth daily.      No current facility-administered medications for this visit.      Past Surgical History:  Procedure Laterality Date  . CATARACT EXTRACTION, BILATERAL    . COLONOSCOPY  07/10/2011   Procedure: COLONOSCOPY;  Surgeon: Dorothyann Peng, MD;  [facility];  Service: Endoscopy;  Laterality: N/A;  10:30 AM  . CRANIOTOMY N/A 10/14/2016   Procedure: CRANIOTOMY HEMATOMA EVACUATION SUBDURAL;  Surgeon: Jovita Gamma, MD;  Location: [place];  Service: Neurosurgery;  Laterality: N/A;  CRANIOTOMY HEMATOMA EVACUATION SUBDURAL  . THYROIDECTOMY, PARTIAL    . TONSILLECTOMY       No Known Allergies    Family History  Problem Relation Age of Onset  . CAD Father 60  . Cancer Mother   . Alzheimer's disease Mother   . Colon cancer Neg Hx      Social History Andrew [name] reports that he quit smoking about 47 years  ago. He has a 15.00 pack-year smoking history. He has never used smokeless tobacco. Andrew [name] reports no history of alcohol use.   Review of Systems CONSTITUTIONAL: No weight loss, fever, chills, weakness or fatigue.  HEENT: Eyes: No visual loss, blurred vision, double vision or yellow sclerae.No hearing loss, sneezing, congestion, runny nose or sore throat.  SKIN: No rash or itching.  CARDIOVASCULAR: no chest pain, no palpitations.  RESPIRATORY: No shortness of breath, cough or sputum.  GASTROINTESTINAL: No anorexia, nausea, vomiting or diarrhea. No abdominal pain or blood.  GENITOURINARY: No burning on urination, no polyuria NEUROLOGICAL: No headache, dizziness, syncope, paralysis,  ataxia, numbness or tingling in the extremities. No change in bowel or bladder control.  MUSCULOSKELETAL: No muscle, back pain, joint pain or stiffness.  LYMPHATICS: No enlarged nodes. No history of splenectomy.  PSYCHIATRIC: No history of depression or anxiety.  ENDOCRINOLOGIC: No reports of sweating, cold or heat intolerance. No polyuria or polydipsia.  Marland Kitchen   Physical Examination Vitals:   05/16/18 1318  BP: (!) 150/82  Pulse: 86  SpO2: 96%   Vitals:   05/16/18 1318  Weight: 221 lb (100.2 kg)  Height: 6\' 2"  (1.88 m)    Gen: resting comfortably, no acute distress HEENT: no scleral icterus, pupils equal round and reactive, no palptable cervical adenopathy,  CV: RRR, no m/r/g no jvd Resp: Clear to auscultation bilaterally GI: abdomen is soft, non-tender, non-distended, normal bowel sounds, no hepatosplenomegaly MSK: extremities are warm, no edema.  Skin: warm, no rash Neuro:  no focal deficits Psych: appropriate affect   Diagnostic Studies 09/2016 echo Study Conclusions  - Left ventricle: The cavity size was normal. There was mild concentric hypertrophy. Systolic function was normal. The estimated ejection fraction was in the range of 55% to 60%. Wall motion was normal; there were no regional wall motion abnormalities. - Aortic valve: There was trivial regurgitation. - Left atrium: The atrium was mildly dilated.    Assessment and Plan  1. PAF - no symptoms, continue current meds - ongoing thrombocytopenia, prior SDH. Not a candidate for anticoag  2. HTN - above goal, increase coreg to 6.25mg  bid   F/u 6 months      [name], M.D.

## 2018-05-16 NOTE — Patient Instructions (Signed)
Medication Instructions:  INCREASE COREG TO 6.25 MG =- TWO TIMES DAILY   Labwork: NONE  Testing/Procedures: NONE  Follow-Up: Your physician wants you to follow-up in: 6 MONTHS. You will receive a reminder letter in the mail two months in advance. If you don't receive a letter, please call our office to schedule the follow-up appointment.   Any Other Special Instructions Will Be Listed Below (If Applicable).     If you need a refill on your cardiac medications before your next appointment, please call your pharmacy.

## 2018-06-04 ENCOUNTER — Other Ambulatory Visit (HOSPITAL_COMMUNITY): Payer: Self-pay | Admitting: Internal Medicine

## 2018-06-04 DIAGNOSIS — C9110 Chronic lymphocytic leukemia of B-cell type not having achieved remission: Secondary | ICD-10-CM

## 2018-06-04 MED ORDER — CHLORAMBUCIL 2 MG PO TABS: ORAL_TABLET | ORAL | 0 refills | Status: DC

## 2018-06-06 ENCOUNTER — Inpatient Hospital Stay (HOSPITAL_COMMUNITY): Payer: Medicare HMO | Attending: Hematology

## 2018-06-06 DIAGNOSIS — Z87891 Personal history of nicotine dependence: Secondary | ICD-10-CM | POA: Diagnosis not present

## 2018-06-06 DIAGNOSIS — I700 Atherosclerosis of aorta: Secondary | ICD-10-CM | POA: Diagnosis not present

## 2018-06-06 DIAGNOSIS — I480 Paroxysmal atrial fibrillation: Secondary | ICD-10-CM | POA: Insufficient documentation

## 2018-06-06 DIAGNOSIS — N183 Chronic kidney disease, stage 3 (moderate): Secondary | ICD-10-CM | POA: Insufficient documentation

## 2018-06-06 DIAGNOSIS — R161 Splenomegaly, not elsewhere classified: Secondary | ICD-10-CM | POA: Insufficient documentation

## 2018-06-06 DIAGNOSIS — Z79899 Other long term (current) drug therapy: Secondary | ICD-10-CM | POA: Insufficient documentation

## 2018-06-06 DIAGNOSIS — C9110 Chronic lymphocytic leukemia of B-cell type not having achieved remission: Secondary | ICD-10-CM | POA: Diagnosis not present

## 2018-06-06 DIAGNOSIS — R590 Localized enlarged lymph nodes: Secondary | ICD-10-CM | POA: Insufficient documentation

## 2018-06-06 DIAGNOSIS — N200 Calculus of kidney: Secondary | ICD-10-CM | POA: Diagnosis not present

## 2018-06-06 DIAGNOSIS — I129 Hypertensive chronic kidney disease with stage 1 through stage 4 chronic kidney disease, or unspecified chronic kidney disease: Secondary | ICD-10-CM | POA: Insufficient documentation

## 2018-06-06 DIAGNOSIS — D696 Thrombocytopenia, unspecified: Secondary | ICD-10-CM | POA: Insufficient documentation

## 2018-06-06 DIAGNOSIS — E785 Hyperlipidemia, unspecified: Secondary | ICD-10-CM | POA: Diagnosis not present

## 2018-06-06 DIAGNOSIS — N184 Chronic kidney disease, stage 4 (severe): Secondary | ICD-10-CM | POA: Diagnosis not present

## 2018-06-06 DIAGNOSIS — I4891 Unspecified atrial fibrillation: Secondary | ICD-10-CM | POA: Diagnosis not present

## 2018-06-06 DIAGNOSIS — N179 Acute kidney failure, unspecified: Secondary | ICD-10-CM | POA: Diagnosis not present

## 2018-06-06 LAB — COMPREHENSIVE METABOLIC PANEL
ALT: 16 U/L (ref 0–44)
AST: 17 U/L (ref 15–41)
Albumin: 4.1 g/dL (ref 3.5–5.0)
Alkaline Phosphatase: 35 U/L — ABNORMAL LOW (ref 38–126)
Anion gap: 8 (ref 5–15)
BUN: 41 mg/dL — ABNORMAL HIGH (ref 8–23)
CO2: 26 mmol/L (ref 22–32)
Calcium: 9.6 mg/dL (ref 8.9–10.3)
Chloride: 107 mmol/L (ref 98–111)
Creatinine, Ser: 2.7 mg/dL — ABNORMAL HIGH (ref 0.61–1.24)
GFR calc Af Amer: 25 mL/min — ABNORMAL LOW (ref >60)
GFR calc non Af Amer: 22 mL/min — ABNORMAL LOW (ref >60)
Glucose, Bld: 108 mg/dL — ABNORMAL HIGH (ref 70–99)
Potassium: 3.4 mmol/L — ABNORMAL LOW (ref 3.5–5.1)
Sodium: 141 mmol/L (ref 135–145)
Total Bilirubin: 1 mg/dL (ref 0.3–1.2)
Total Protein: 6.4 g/dL — ABNORMAL LOW (ref 6.5–8.1)

## 2018-06-06 LAB — CBC WITH DIFFERENTIAL/PLATELET
Abs Immature Granulocytes: 0.04 10*3/uL (ref 0.00–0.07)
BASOS PCT: 0 %
Basophils Absolute: 0 10*3/uL (ref 0.0–0.1)
Eosinophils Absolute: 0.1 10*3/uL (ref 0.0–0.5)
Eosinophils Relative: 0 %
HCT: 40.2 % (ref 39.0–52.0)
Hemoglobin: 13.2 g/dL (ref 13.0–17.0)
IMMATURE GRANULOCYTES: 0 %
Lymphocytes Relative: 64 %
Lymphs Abs: 8.7 10*3/uL — ABNORMAL HIGH (ref 0.7–4.0)
MCH: 31.9 pg (ref 26.0–34.0)
MCHC: 32.8 g/dL (ref 30.0–36.0)
MCV: 97.1 fL (ref 80.0–100.0)
MONOS PCT: 5 %
Monocytes Absolute: 0.7 10*3/uL (ref 0.1–1.0)
Neutro Abs: 4.2 10*3/uL (ref 1.7–7.7)
Neutrophils Relative %: 31 %
Platelets: 84 10*3/uL — ABNORMAL LOW (ref 150–400)
RBC: 4.14 MIL/uL — ABNORMAL LOW (ref 4.22–5.81)
RDW: 13.1 % (ref 11.5–15.5)
WBC: 13.7 10*3/uL — ABNORMAL HIGH (ref 4.0–10.5)
nRBC: 0 % (ref 0.0–0.2)

## 2018-06-06 LAB — LACTATE DEHYDROGENASE: LDH: 110 U/L (ref 98–192)

## 2018-06-06 LAB — URIC ACID: URIC ACID, SERUM: 5.2 mg/dL (ref 3.7–8.6)

## 2018-06-10 ENCOUNTER — Other Ambulatory Visit: Payer: Self-pay

## 2018-06-10 ENCOUNTER — Inpatient Hospital Stay (HOSPITAL_COMMUNITY): Payer: Medicare HMO | Attending: Internal Medicine | Admitting: Internal Medicine

## 2018-06-10 ENCOUNTER — Encounter (HOSPITAL_COMMUNITY): Payer: Self-pay | Admitting: Internal Medicine

## 2018-06-10 VITALS — BP 133/82 | HR 58 | Temp 97.7°F | Resp 16 | Wt 220.1 lb

## 2018-06-10 DIAGNOSIS — Z79899 Other long term (current) drug therapy: Secondary | ICD-10-CM

## 2018-06-10 DIAGNOSIS — R161 Splenomegaly, not elsewhere classified: Secondary | ICD-10-CM | POA: Diagnosis not present

## 2018-06-10 DIAGNOSIS — R590 Localized enlarged lymph nodes: Secondary | ICD-10-CM | POA: Diagnosis not present

## 2018-06-10 DIAGNOSIS — Z87891 Personal history of nicotine dependence: Secondary | ICD-10-CM | POA: Diagnosis not present

## 2018-06-10 DIAGNOSIS — N183 Chronic kidney disease, stage 3 unspecified: Secondary | ICD-10-CM

## 2018-06-10 DIAGNOSIS — D696 Thrombocytopenia, unspecified: Secondary | ICD-10-CM | POA: Diagnosis not present

## 2018-06-10 DIAGNOSIS — C9110 Chronic lymphocytic leukemia of B-cell type not having achieved remission: Secondary | ICD-10-CM | POA: Diagnosis not present

## 2018-06-10 DIAGNOSIS — I129 Hypertensive chronic kidney disease with stage 1 through stage 4 chronic kidney disease, or unspecified chronic kidney disease: Secondary | ICD-10-CM | POA: Diagnosis not present

## 2018-06-10 DIAGNOSIS — I480 Paroxysmal atrial fibrillation: Secondary | ICD-10-CM | POA: Diagnosis not present

## 2018-06-10 NOTE — Patient Instructions (Signed)
[facility]  Discharge Instructions: You saw Dr. Higgs today                               _______________________________________________________________  Thank you for choosing [facility] to provide your oncology and hematology care.  To afford each patient quality time with our providers, please arrive at least 15 minutes before your scheduled appointment.  You need to re-schedule your appointment if you arrive 10 or more minutes late.  We strive to give you quality time with our providers, and arriving late affects you and other patients whose appointments are after yours.  Also, if you no show three or more times for appointments you may be dismissed from the clinic.  Again, thank you for choosing [facility]. Our hope is that these requests will allow you access to exceptional care and in a timely manner. _______________________________________________________________  If you have questions after your visit, please contact our office at [phone] between the hours of 8:30 a.m. and 5:00 p.m. Voicemails left after 4:30 p.m. will not be returned until the following business day. _______________________________________________________________  For prescription refill requests, have your pharmacy contact our office. _______________________________________________________________  Recommendations made by the consultant and any test results will be sent to your referring physician. _______________________________________________________________ 

## 2018-06-10 NOTE — Progress Notes (Signed)
Diagnosis CLL (chronic lymphocytic leukemia) ([name]) - Plan: CBC with Differential/Platelet, Comprehensive metabolic panel, Lactate dehydrogenase, Uric acid, Phosphorus, NM PET Image Restag (PS) Skull Base To Thigh  Stage 3 chronic kidney disease ([place]) - Plan: CBC with Differential/Platelet, Comprehensive metabolic panel, Lactate dehydrogenase, Uric acid, Phosphorus, NM PET Image Restag (PS) Skull Base To Thigh  Staging Cancer Staging CLL (chronic lymphocytic leukemia) (HCC) Staging form: Chronic Lymphocytic Leukemia / Small Lymphocytic Lymphoma, AJCC 8th Edition - Clinical stage from 09/04/2016: Modified Rai Stage IV ([place]: Stage C, Modified Rai risk: High, Lymphocytosis: Present, Adenopathy: Present, Organomegaly: Present, Anemia: Absent, Thrombocytopenia: Present) - Signed by Baird Cancer, PA-C on 09/04/2016   Assessment and Plan:   1.   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 Pt was previously followed by Dr. Talbert Cage.  CT neck/chest/abd/pelvis on 08/03/16 showed progressive adenopathy. 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 Progressive thrombocytopenia with platelets <100,000. Treatment initiated with Imbruvica 420 mg daily on 08/19/16.  Imbruvica stopped on 10/11/16 for subdural hematoma possibly secondary to fall in January 2018 vs. Effects of imbruvica.  Pt had previous discussion with Dr. Talbert Cage regarding recommendations due to discontinuation of  imbruvica permanently in light of the subdural hematoma.    PET scan for restaging evaluation of adenopathy which was chosen due to worsening renal function.   Pet scan done 11/15/2017 showed  IMPRESSION: Diffuse hypermetabolic lymphadenopathy throughout the neck, chest, abdomen, and pelvis, without significant change in size compared to prior CT on 08/03/2016.  No new or progressive lymphadenopathy identified.  Previously options of therapy discussed with pt by Dr Talbert Cage for bendamustin + rituxan or chlorambucil +  obinutuzumab. Patient would prefer an oral regimen.    He was provided written information about Chlorambucil and obinutuzumab. He does not desire to take obinutuzumab due to it being an infusional agent.  Chlorambucil originally dosed at 0.5 mg/kg D1 and D15.   Dose reduction due to RI.  He was started on 40 mg D1 and D15.  Due to worsening renal function dosing is now 30 mg D1 and D15.  Will continue present dose.    Hep B and C and HIV was checked prior to therapy initiation and all testing was negative.   Labs done 06/06/2018 reviewed and showed WBC 13.7 HB 13.2 plts 84,000.  Chemistries WNL with K+ 3.4 Cr 2.7 and normal LFTs.  Uric acid normal at 5.2.   I have discussed with pt WBC  At 13.7 and HB improved at 13  Plt count remains decreased at 84,000.  Pt is due for repeat imaging and he is set up for PET scan in 06/2018 and will follow-up to go over results and labs at that time.    I have discussed with him option of Venetoclax and written information provided if any significant change in counts or worsening findings on PET scan.    2.  CKD.  Labs done 06/06/2018 reviewed and showed Cr 2.7.  Pt on prednisone per nephrology.  Pt was treated with rasburicase to decrease risk for TLS.  Continue hydration.  He had kidney biopsy done while hospitalized on 12/19/2017 and path returned as CLL.    3.  TLS prophylaxis.  Pt was treated with Rasburicase.    UA remains normal at 5.2 on labs done 05/2018.  Will continue to follow  due to kidney involvement with CLL.  Pt no longer on allopurinol.    4.  Subdural hematoma s/p craniotomy/Headaches: Follow-up with  Dr. Sherwood Gambler, neurosurgery.   5.  HTN.  BP is 133/82.  Follow-up with cardiology and PCP as directed.    6.  Health maintenance.  Follow-up with PCP and GI as recommended.    25  minutes spent with more than 50% spent in counseling and coordination of care.    Interval History:  Historical data obtained from note dated 11/08/2017.  CLL.  Fish from  bone marrow done 07/2016 showed 13q-. Zap 70 was 7.  Pt was previously followed by Dr. Talbert Cage.  CT neck/chest/abd/pelvis on 08/03/16 showed progressive adenopathy. 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 Progressive thrombocytopenia appreciated with platelets <100,000. Treatment initiated with Imbruvica 420 mg daily on 08/19/16.  Imbruvica stopped on 10/11/16 for subdural hematoma possibly secondary to fall in January 2018 vs. Effects of imbruvica.  Pt had previous discussion with Dr. Talbert Cage regarding treatment recommendations due to discontinuation of  imbruvica permanently in light of the subdural hematoma.    Current Status:  Pt is seen today for follow-up.  He is here to go over labs.  He denies any fevers, chills, night sweats and has noted on adenopathy. He remains on prednisone per nephrology.      CLL (chronic lymphocytic leukemia) ([place])   03/09/2015 Initial Diagnosis    CLL (chronic lymphocytic leukemia) ([place])    08/03/2016 Imaging    CT neck- 1. Diffuse and bulky lymphadenopathy in keeping with history of CLL. Nodal enlargement has diffusely progressed since April 2016. 2. Chest and abdomen CT reported separately.    08/03/2016 Imaging    CT CAP- 1. Bulky lymphadenopathy in the chest, abdomen, and pelvis as described. 2. Splenomegaly. 3. Coronary artery and thoracoabdominal aortic atherosclerosis. 4. Prostatomegaly. 5. Several tiny lucent lesions in the bony anatomic pelvis, nonspecific, but attention on follow-up imaging recommended.    08/10/2016 Procedure    Bone marrow aspiration and biopsy by IR.    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 PERIPHERAL BLOOD: - CHRONIC LYMPHOCYTIC LEUKEMIA. - THROMBOCYTOPENIA.    08/11/2016 Pathology Results    Bone Marrow Flow Cytometry - CHRONIC LYMPHOCYTIC LEUKEMIA/SMALL LYMPHOCYTIC LYMPHOMA.    08/16/2016  Pathology Results    Normal cytogenetics.  Molecular cytogenetic analysis by [facility] demonstrates a 13q-    08/19/2016 -  Chemotherapy    Imbruvica 420 mg daily       Problem List Patient Active Problem List   Diagnosis Date Noted  . AKI (acute kidney injury) ([place]) [N17.9] 12/17/2017  . Hypertension [I10]   . Subdural hematoma ([place]) [S06.5X9A] 10/12/2016  . A-fib ([place]) [I48.91] 10/12/2016  . Hypoxemia [R09.02] 05/13/2016  . Preventative health care [Z00.00] 03/06/2016  . CLL (chronic lymphocytic leukemia) ([place]) [C91.10] 03/09/2015    Past Medical History Past Medical History:  Diagnosis Date  . CKD (chronic kidney disease), stage IV ([place])   . CLL (chronic lymphocytic leukemia) ([place])   . Hypertension   . NICM (nonischemic cardiomyopathy) ([place])    a. cath 2007, EF 35%, minimal CAD. b. EF normal 2018.  Marland Kitchen PAF (paroxysmal atrial fibrillation) ([place])   . Pinched nerve in neck   . SDH (subdural hematoma) ([place])   . Thrombocytopenia [facility])     Past Surgical History Past Surgical History:  Procedure Laterality Date  . CATARACT EXTRACTION, BILATERAL    . COLONOSCOPY  07/10/2011   Procedure: COLONOSCOPY;  Surgeon: Dorothyann Peng, MD;  Location[facility];  Service: Endoscopy;  Laterality: N/A;  10:30 AM  . CRANIOTOMY N/A 10/14/2016   Procedure: CRANIOTOMY HEMATOMA EVACUATION SUBDURAL;  Surgeon: Jovita Gamma, MD;  Location: [place];  Service: Neurosurgery;  Laterality: N/A;  CRANIOTOMY HEMATOMA EVACUATION SUBDURAL  . THYROIDECTOMY, PARTIAL    . TONSILLECTOMY      Family History Family History  Problem Relation Age of Onset  . CAD Father 32  . Cancer Mother   . Alzheimer's disease Mother   . Colon cancer Neg Hx      Social History  reports that he quit smoking about 47 years ago. He has a 15.00 pack-year smoking history. He has never used smokeless tobacco. He reports that he does not drink alcohol or use drugs.  Medications  Current Outpatient Medications:  .   diphenhydramine-acetaminophen (TYLENOL PM) 25-500 MG TABS tablet, Take 1 tablet by mouth at bedtime as needed., Disp: , Rfl:  .  carvedilol (COREG) 6.25 MG tablet, Take 1 tablet (6.25 mg total) by mouth 2 (two) times daily., Disp: 180 tablet, Rfl: 3 .  chlorambucil (LEUKERAN) 2 MG tablet, Take 15 tablets (57m total) on Day 1 and Day 15. ( every other week) Give on an empty stomach 1 hour before or 2 hours after meals., Disp: 30 tablet, Rfl: 0 .  Coenzyme Q10 (COQ-10) 200 MG CAPS, Take 400 mg by mouth every evening., Disp: , Rfl:  .  Multiple Vitamin (MULITIVITAMIN WITH MINERALS) TABS, Take 1 tablet by mouth daily., Disp: , Rfl:  .  predniSONE (DELTASONE) 10 MG tablet, Take 5 mg by mouth daily. , Disp: , Rfl: 0 .  sodium bicarbonate 650 MG tablet, Take 1 tablet (650 mg total) by mouth 2 (two) times daily., Disp: 60 tablet, Rfl: 0 .  tamsulosin (FLOMAX) 0.4 MG CAPS capsule, Take 0.4 mg by mouth daily. , Disp: , Rfl:   Allergies Patient has no known allergies.  Review of Systems Review of Systems - Oncology ROS negative   Physical Exam  Vitals Wt Readings from Last 3 Encounters:  06/10/18 220 lb 1.6 oz (99.8 kg)  05/16/18 221 lb (100.2 kg)  05/13/18 220 lb 8 oz (100 kg)   Temp Readings from Last 3 Encounters:  06/10/18 97.7 F (36.5 C) (Oral)  05/13/18 (!) 97.4 F (36.3 C) (Oral)  04/15/18 (!) 97.5 F (36.4 C) (Oral)   BP Readings from Last 3 Encounters:  06/10/18 133/82  05/16/18 (!) 150/82  05/13/18 (!) 134/103   Pulse Readings from Last 3 Encounters:  06/10/18 (!) 58  05/16/18 86  05/13/18 76   Constitutional: Well-developed, well-nourished, and in no distress.   HENT: Head: Normocephalic and atraumatic.  Mouth/Throat: No oropharyngeal exudate. Mucosa moist. Eyes: Pupils are equal, round, and reactive to light. Conjunctivae are normal. No scleral icterus.  Neck: Normal range of motion. Neck supple. No JVD present.  Cardiovascular: Normal rate, regular rhythm and  normal heart sounds.  Exam reveals no gallop and no friction rub.   No murmur heard. Pulmonary/Chest: Effort normal and breath sounds normal. No respiratory distress. No wheezes.No rales.  Abdominal: Soft. Bowel sounds are normal. No distension. There is no tenderness. There is no guarding.  Musculoskeletal: No edema or tenderness.  Lymphadenopathy: No cervical, axillary or supraclavicular adenopathy.  Neurological: Alert and oriented to person, place, and time. No cranial nerve deficit.  Skin: Skin is warm and dry. No rash noted. No erythema. No pallor.  Psychiatric: Affect and judgment normal.   Labs No visits with results within 3  Day(s) from this visit.  Latest known visit with results is:  Appointment on 06/06/2018  Component Date Value Ref Range Status  . WBC 06/06/2018 13.7* 4.0 - 10.5 K/uL Final  . RBC 06/06/2018 4.14* 4.22 - 5.81 MIL/uL Final  . Hemoglobin 06/06/2018 13.2  13.0 - 17.0 g/dL Final  . HCT 06/06/2018 40.2  39.0 - 52.0 % Final  . MCV 06/06/2018 97.1  80.0 - 100.0 fL Final  . MCH 06/06/2018 31.9  26.0 - 34.0 pg Final  . MCHC 06/06/2018 32.8  30.0 - 36.0 g/dL Final  . RDW 06/06/2018 13.1  11.5 - 15.5 % Final  . Platelets 06/06/2018 84* 150 - 400 K/uL Final   Comment: PLATELET COUNT CONFIRMED BY SMEAR SPECIMEN CHECKED FOR CLOTS Immature Platelet Fraction may be clinically indicated, consider ordering this additional test [id number]   . nRBC 06/06/2018 0.0  0.0 - 0.2 % Final  . Neutrophils Relative % 06/06/2018 31  % Final  . Neutro Abs 06/06/2018 4.2  1.7 - 7.7 K/uL Final  . Lymphocytes Relative 06/06/2018 64  % Final  . Lymphs Abs 06/06/2018 8.7* 0.7 - 4.0 K/uL Final  . Monocytes Relative 06/06/2018 5  % Final  . Monocytes Absolute 06/06/2018 0.7  0.1 - 1.0 K/uL Final  . Eosinophils Relative 06/06/2018 0  % Final  . Eosinophils Absolute 06/06/2018 0.1  0.0 - 0.5 K/uL Final  . Basophils Relative 06/06/2018 0  % Final  . Basophils Absolute 06/06/2018 0.0  0.0 -  0.1 K/uL Final  . WBC Morphology 06/06/2018 ABSOLUTE LYMPHOCYTOSIS   Final  . Immature Granulocytes 06/06/2018 0  % Final  . Abs Immature Granulocytes 06/06/2018 0.04  0.00 - 0.07 K/uL Final  . Reactive, Benign Lymphocytes 06/06/2018 PRESENT   Final  . Smudge Cells 06/06/2018 PRESENT   Final   Performed at [facility], [address] [place], [place] [address]  . Sodium 06/06/2018 141  135 - 145 mmol/L Final  . Potassium 06/06/2018 3.4* 3.5 - 5.1 mmol/L Final  . Chloride 06/06/2018 107  98 - 111 mmol/L Final  . CO2 06/06/2018 26  22 - 32 mmol/L Final  . Glucose, Bld 06/06/2018 108* 70 - 99 mg/dL Final  . BUN 06/06/2018 41* 8 - 23 mg/dL Final  . Creatinine, Ser 06/06/2018 2.70* 0.61 - 1.24 mg/dL Final  . Calcium 06/06/2018 9.6  8.9 - 10.3 mg/dL Final  . Total Protein 06/06/2018 6.4* 6.5 - 8.1 g/dL Final  . Albumin 06/06/2018 4.1  3.5 - 5.0 g/dL Final  . AST 06/06/2018 17  15 - 41 U/L Final  . ALT 06/06/2018 16  0 - 44 U/L Final  . Alkaline Phosphatase 06/06/2018 35* 38 - 126 U/L Final  . Total Bilirubin 06/06/2018 1.0  0.3 - 1.2 mg/dL Final  . GFR calc non Af Amer 06/06/2018 22* >60 mL/min Final  . GFR calc Af Amer 06/06/2018 25* >60 mL/min Final  . Anion gap 06/06/2018 8  5 - 15 Final   Performed at [facility], [address] [place], [place] [address]  . LDH 06/06/2018 110  98 - 192 U/L Final   Performed at [facility], [address], [place], [place] [address]  . Uric Acid, Serum 06/06/2018 5.2  3.7 - 8.6 mg/dL Final   Performed at [facility], [address], [place], [place] [address]     Pathology Orders Placed This Encounter  Procedures  . NM PET Image Restag (PS) Skull Base To Thigh  Standing Status:   Future    Standing Expiration Date:   06/10/2019    Order Specific Question:   ** REASON FOR EXAM (FREE TEXT)    Answer:   cll, renal failure    Order Specific Question:   If indicated for the ordered procedure, I authorize the administration of a radiopharmaceutical  per Radiology protocol    Answer:   Yes    Order Specific Question:   Preferred imaging location?    Answer:   [facility]    Order Specific Question:   Radiology Contrast Protocol - do NOT remove file path    Answer:   [email or web address]  . CBC with Differential/Platelet    Standing Status:   Future    Standing Expiration Date:   06/11/2019  . Comprehensive metabolic panel    Standing Status:   Future    Standing Expiration Date:   06/11/2019  . Lactate dehydrogenase    Standing Status:   Future    Standing Expiration Date:   06/11/2019  . Uric acid    Standing Status:   Future    Standing Expiration Date:   06/11/2019  . Phosphorus    Standing Status:   Future    Standing Expiration Date:   06/11/2019       Zoila Shutter MD

## 2018-07-01 ENCOUNTER — Encounter (HOSPITAL_COMMUNITY)
Admission: RE | Admit: 2018-07-01 | Discharge: 2018-07-01 | Disposition: A | Discharge status: Home / Self Care | Payer: Medicare HMO | Source: Ambulatory Visit | Attending: Internal Medicine | Admitting: Internal Medicine

## 2018-07-01 DIAGNOSIS — C8590 Non-Hodgkin lymphoma, unspecified, unspecified site: Secondary | ICD-10-CM | POA: Diagnosis not present

## 2018-07-01 DIAGNOSIS — N183 Chronic kidney disease, stage 3 unspecified: Secondary | ICD-10-CM

## 2018-07-01 DIAGNOSIS — C9110 Chronic lymphocytic leukemia of B-cell type not having achieved remission: Secondary | ICD-10-CM | POA: Diagnosis present

## 2018-07-01 MED ORDER — FLUDEOXYGLUCOSE F - 18 (FDG) INJECTION
13.0800 | Freq: Once | INTRAVENOUS | Status: AC | PRN
  Administered 2018-07-01: 13.08 via INTRAVENOUS

## 2018-07-08 ENCOUNTER — Inpatient Hospital Stay (HOSPITAL_COMMUNITY): Payer: Medicare HMO | Attending: Hematology

## 2018-07-08 DIAGNOSIS — I2510 Atherosclerotic heart disease of native coronary artery without angina pectoris: Secondary | ICD-10-CM | POA: Diagnosis not present

## 2018-07-08 DIAGNOSIS — Z9221 Personal history of antineoplastic chemotherapy: Secondary | ICD-10-CM | POA: Insufficient documentation

## 2018-07-08 DIAGNOSIS — I129 Hypertensive chronic kidney disease with stage 1 through stage 4 chronic kidney disease, or unspecified chronic kidney disease: Secondary | ICD-10-CM | POA: Insufficient documentation

## 2018-07-08 DIAGNOSIS — S065X9D Traumatic subdural hemorrhage with loss of consciousness of unspecified duration, subsequent encounter: Secondary | ICD-10-CM | POA: Insufficient documentation

## 2018-07-08 DIAGNOSIS — C9110 Chronic lymphocytic leukemia of B-cell type not having achieved remission: Secondary | ICD-10-CM | POA: Diagnosis present

## 2018-07-08 DIAGNOSIS — Z79899 Other long term (current) drug therapy: Secondary | ICD-10-CM | POA: Diagnosis not present

## 2018-07-08 DIAGNOSIS — Z87891 Personal history of nicotine dependence: Secondary | ICD-10-CM | POA: Insufficient documentation

## 2018-07-08 DIAGNOSIS — N184 Chronic kidney disease, stage 4 (severe): Secondary | ICD-10-CM | POA: Diagnosis not present

## 2018-07-08 DIAGNOSIS — I480 Paroxysmal atrial fibrillation: Secondary | ICD-10-CM | POA: Diagnosis not present

## 2018-07-08 DIAGNOSIS — N183 Chronic kidney disease, stage 3 unspecified: Secondary | ICD-10-CM

## 2018-07-08 DIAGNOSIS — R161 Splenomegaly, not elsewhere classified: Secondary | ICD-10-CM | POA: Insufficient documentation

## 2018-07-08 LAB — CBC WITH DIFFERENTIAL/PLATELET
ABS IMMATURE GRANULOCYTES: 0.03 10*3/uL (ref 0.00–0.07)
Basophils Absolute: 0 10*3/uL (ref 0.0–0.1)
Basophils Relative: 0 %
Eosinophils Absolute: 0.2 10*3/uL (ref 0.0–0.5)
Eosinophils Relative: 2 %
HCT: 38 % — ABNORMAL LOW (ref 39.0–52.0)
Hemoglobin: 12.6 g/dL — ABNORMAL LOW (ref 13.0–17.0)
Immature Granulocytes: 0 %
Lymphocytes Relative: 61 %
Lymphs Abs: 5.5 10*3/uL — ABNORMAL HIGH (ref 0.7–4.0)
MCH: 31.7 pg (ref 26.0–34.0)
MCHC: 33.2 g/dL (ref 30.0–36.0)
MCV: 95.7 fL (ref 80.0–100.0)
MONOS PCT: 7 %
Monocytes Absolute: 0.6 10*3/uL (ref 0.1–1.0)
Neutro Abs: 2.8 10*3/uL (ref 1.7–7.7)
Neutrophils Relative %: 30 %
Platelets: 70 10*3/uL — ABNORMAL LOW (ref 150–400)
RBC: 3.97 MIL/uL — ABNORMAL LOW (ref 4.22–5.81)
RDW: 13 % (ref 11.5–15.5)
WBC: 9.1 10*3/uL (ref 4.0–10.5)
nRBC: 0 % (ref 0.0–0.2)

## 2018-07-08 LAB — COMPREHENSIVE METABOLIC PANEL
ALT: 18 U/L (ref 0–44)
AST: 19 U/L (ref 15–41)
Albumin: 4.3 g/dL (ref 3.5–5.0)
Alkaline Phosphatase: 35 U/L — ABNORMAL LOW (ref 38–126)
Anion gap: 7 (ref 5–15)
BUN: 39 mg/dL — ABNORMAL HIGH (ref 8–23)
CO2: 26 mmol/L (ref 22–32)
Calcium: 9.3 mg/dL (ref 8.9–10.3)
Chloride: 108 mmol/L (ref 98–111)
Creatinine, Ser: 3.04 mg/dL — ABNORMAL HIGH (ref 0.61–1.24)
GFR calc Af Amer: 22 mL/min — ABNORMAL LOW (ref >60)
GFR calc non Af Amer: 19 mL/min — ABNORMAL LOW (ref >60)
GLUCOSE: 100 mg/dL — AB (ref 70–99)
Potassium: 3.8 mmol/L (ref 3.5–5.1)
SODIUM: 141 mmol/L (ref 135–145)
Total Bilirubin: 1 mg/dL (ref 0.3–1.2)
Total Protein: 6.4 g/dL — ABNORMAL LOW (ref 6.5–8.1)

## 2018-07-08 LAB — URIC ACID: Uric Acid, Serum: 6 mg/dL (ref 3.7–8.6)

## 2018-07-08 LAB — LACTATE DEHYDROGENASE: LDH: 133 U/L (ref 98–192)

## 2018-07-08 LAB — PHOSPHORUS: Phosphorus: 3.3 mg/dL (ref 2.5–4.6)

## 2018-07-15 ENCOUNTER — Inpatient Hospital Stay (HOSPITAL_COMMUNITY): Payer: Medicare HMO | Admitting: Internal Medicine

## 2018-07-15 ENCOUNTER — Encounter (HOSPITAL_COMMUNITY): Payer: Self-pay | Admitting: Internal Medicine

## 2018-07-15 ENCOUNTER — Other Ambulatory Visit: Payer: Self-pay

## 2018-07-15 VITALS — BP 138/91 | HR 68 | Temp 97.7°F | Resp 16 | Wt 225.0 lb

## 2018-07-15 DIAGNOSIS — Z87891 Personal history of nicotine dependence: Secondary | ICD-10-CM

## 2018-07-15 DIAGNOSIS — Z9221 Personal history of antineoplastic chemotherapy: Secondary | ICD-10-CM | POA: Diagnosis not present

## 2018-07-15 DIAGNOSIS — R161 Splenomegaly, not elsewhere classified: Secondary | ICD-10-CM | POA: Diagnosis not present

## 2018-07-15 DIAGNOSIS — S065X9D Traumatic subdural hemorrhage with loss of consciousness of unspecified duration, subsequent encounter: Secondary | ICD-10-CM | POA: Diagnosis not present

## 2018-07-15 DIAGNOSIS — N184 Chronic kidney disease, stage 4 (severe): Secondary | ICD-10-CM | POA: Diagnosis not present

## 2018-07-15 DIAGNOSIS — I2510 Atherosclerotic heart disease of native coronary artery without angina pectoris: Secondary | ICD-10-CM

## 2018-07-15 DIAGNOSIS — Z79899 Other long term (current) drug therapy: Secondary | ICD-10-CM

## 2018-07-15 DIAGNOSIS — I480 Paroxysmal atrial fibrillation: Secondary | ICD-10-CM

## 2018-07-15 DIAGNOSIS — C9110 Chronic lymphocytic leukemia of B-cell type not having achieved remission: Secondary | ICD-10-CM | POA: Diagnosis not present

## 2018-07-15 DIAGNOSIS — I129 Hypertensive chronic kidney disease with stage 1 through stage 4 chronic kidney disease, or unspecified chronic kidney disease: Secondary | ICD-10-CM

## 2018-07-15 NOTE — Progress Notes (Signed)
Diagnosis CLL (chronic lymphocytic leukemia) ([place]) - Plan: CBC with Differential, Comprehensive metabolic panel, Lactate dehydrogenase, Uric acid, CBC with Differential, Comprehensive metabolic panel, Lactate dehydrogenase  Staging Cancer Staging CLL (chronic lymphocytic leukemia) (HCC) Staging form: Chronic Lymphocytic Leukemia / Small Lymphocytic Lymphoma, AJCC 8th Edition - Clinical stage from 09/04/2016: Modified Rai Stage IV (Binet: Stage C, Modified Rai risk: High, Lymphocytosis: Present, Adenopathy: Present, Organomegaly: Present, Anemia: Absent, Thrombocytopenia: Present) - Signed by Baird Cancer, PA-C on 09/04/2016   Assessment and Plan:  1.   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 Pt was previously followed by Dr. Talbert Cage.  CT neck/chest/abd/pelvis on 08/03/16 showed progressive adenopathy. 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 Progressive thrombocytopenia with platelets <100,000. Treatment initiated with Imbruvica 420 mg daily on 08/19/16.  Imbruvica stopped on 10/11/16 for subdural hematoma possibly secondary to fall in January 2018 vs. Effects of imbruvica.  Pt had previous discussion with Dr. Talbert Cage regarding recommendations due to discontinuation of  imbruvica permanently in light of the subdural hematoma.    PET scan for restaging evaluation of adenopathy which was chosen due to worsening renal function.   Pet scan done 11/15/2017 showed  IMPRESSION: Diffuse hypermetabolic lymphadenopathy throughout the neck, chest, abdomen, and pelvis, without significant change in size compared to prior CT on 08/03/2016.  No new or progressive lymphadenopathy identified.  Previously options of therapy discussed with pt by Dr Talbert Cage for bendamustin + rituxan or chlorambucil + obinutuzumab. Patient would prefer an oral regimen.    He was provided written information about Chlorambucil and obinutuzumab. He does not desire to take obinutuzumab due to it being an  infusional agent.  Chlorambucil originally dosed at 0.5 mg/kg D1 and D15.   Dose reduction due to RI.  He was started on 40 mg D1 and D15.  Due to worsening renal function dosing is now 30 mg D1 and D15.  Will continue present dose.    Hep B and C and HIV was checked prior to therapy initiation and all testing was negative.   Labs done 07/08/2018 reviewed and showed WBC WNL at 9.1 HB WNL at 12.6 plts 70,000.  Chemistries WNL with K+ 3.8 and normal LFTs.    PET scan done 07/01/2018 reviewed and showed  IMPRESSION: 1. Interval reduction in size of cervical, axillary, retroperitoneal and iliac lymphadenopathy with lymph nodes now near normal in size and with metabolic activity less than blood pool activity ([place] 2). 2. Interval reduction in size of the spleen. 3. New intense metabolic activity associated with the stomach without mass lesion. Favor gastritis over lymphoma.  Pt has responded to chlorambucil based on labs and PET results.    Previously, I discussed with him option of Venetoclax and written information provided if any significant change in counts or worsening findings on PET scan.  Pt desires to continue Chlorambucil due to risks associated with Venetoclax as scans and labs are improved.  He will have monthly labs and will be seen for follow-up in 09/2018.    2.  CKD.  Labs done 06/06/2018 reviewed and showed Cr 3.  Pt on prednisone per nephrology which is being tapered.  Pt was treated with rasburicase to decrease risk for TLS.  Continue hydration.  He had kidney biopsy done while hospitalized on 12/19/2017 and path returned as CLL.  Uric acid is 6.  Will continue monthly lab evaluation.    3.  TLS prophylaxis.  Pt was treated with Rasburicase.  UA remains normal at 6 on labs done 07/08/2018.   Will continue to follow  due to kidney involvement with CLL.  Pt no longer on allopurinol.    4.  Subdural hematoma s/p craniotomy/Headaches: Follow-up with Dr. Sherwood Gambler, neurosurgery.   5.   HTN.  BP is 138/91.   Follow-up with cardiology and PCP as directed.    6.  Health maintenance.  Follow-up with PCP and GI as recommended.    25  minutes spent with more than 50% spent in counseling and coordination of care.    Interval History:  Historical data obtained from note dated 11/08/2017.  CLL.  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 Zap 70 was 7.  Pt was previously followed by Dr. Talbert Cage.  CT neck/chest/abd/pelvis on 08/03/16 showed progressive adenopathy. 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 Progressive thrombocytopenia appreciated with platelets <100,000. Treatment initiated with Imbruvica 420 mg daily on 08/19/16.  Imbruvica stopped on 10/11/16 for subdural hematoma possibly secondary to fall in January 2018 vs. Effects of imbruvica.  Pt had previous discussion with Dr. Talbert Cage regarding treatment recommendations due to discontinuation of  imbruvica permanently in light of the subdural hematoma.    Current Status:  Pt is seen today for follow-up.  He is here to go over labs and PET scan.  He denies any fevers, chills, night sweats and has noted on adenopathy. He remains on prednisone per nephrology which is being tapered.     CLL (chronic lymphocytic leukemia) ([place])   03/09/2015 Initial Diagnosis    CLL (chronic lymphocytic leukemia) ([place])    08/03/2016 Imaging    CT neck- 1. Diffuse and bulky lymphadenopathy in keeping with history of CLL. Nodal enlargement has diffusely progressed since April 2016. 2. Chest and abdomen CT reported separately.    08/03/2016 Imaging    CT CAP- 1. Bulky lymphadenopathy in the chest, abdomen, and pelvis as described. 2. Splenomegaly. 3. Coronary artery and thoracoabdominal aortic atherosclerosis. 4. Prostatomegaly. 5. Several tiny lucent lesions in the bony anatomic pelvis, nonspecific, but attention on follow-up imaging recommended.    08/10/2016 Procedure    Bone marrow aspiration and biopsy by IR.    08/11/2016 Pathology Results     Bone Marrow, Aspirate,Biopsy, and Clot, left iliac crest - MARKED INVOLVEMENT BY CHRONIC LYMPHOCYTIC LEUKEMIA/SMALL LYMPHOCYTIC LYMPHOMA. PERIPHERAL BLOOD: - CHRONIC LYMPHOCYTIC LEUKEMIA. - THROMBOCYTOPENIA.    08/11/2016 Pathology Results    Bone Marrow Flow Cytometry - CHRONIC LYMPHOCYTIC LEUKEMIA/SMALL LYMPHOCYTIC LYMPHOMA.    08/16/2016 Pathology Results    Normal cytogenetics.  Molecular cytogenetic analysis by [facility] demonstrates a 13q-    08/19/2016 -  Chemotherapy    Imbruvica 420 mg daily       Problem List Patient Active Problem List   Diagnosis Date Noted  . AKI (acute kidney injury) ([place]) [N17.9] 12/17/2017  . Hypertension [I10]   . Subdural hematoma ([place]) [S06.5X9A] 10/12/2016  . A-fib ([place]) [I48.91] 10/12/2016  . Hypoxemia [R09.02] 05/13/2016  . Preventative health care [Z00.00] 03/06/2016  . CLL (chronic lymphocytic leukemia) ([place]) [C91.10] 03/09/2015    Past Medical History Past Medical History:  Diagnosis Date  . CKD (chronic kidney disease), stage IV ([place])   . CLL (chronic lymphocytic leukemia) ([place])   . Hypertension   . NICM (nonischemic cardiomyopathy) ([place])    a. cath 2007, EF 35%, minimal CAD. b. EF normal 2018.  Marland Kitchen PAF (paroxysmal atrial fibrillation) ([place])   . Pinched nerve in neck   . SDH (subdural hematoma) ([place])   .  Thrombocytopenia [facility])     Past Surgical History Past Surgical History:  Procedure Laterality Date  . CATARACT EXTRACTION, BILATERAL    . COLONOSCOPY  07/10/2011   Procedure: COLONOSCOPY;  Surgeon: Dorothyann Peng, MD;  [facility];  Service: Endoscopy;  Laterality: N/A;  10:30 AM  . CRANIOTOMY N/A 10/14/2016   Procedure: CRANIOTOMY HEMATOMA EVACUATION SUBDURAL;  Surgeon: Jovita Gamma, MD;  Location: [place];  Service: Neurosurgery;  Laterality: N/A;  CRANIOTOMY HEMATOMA EVACUATION SUBDURAL  . THYROIDECTOMY, PARTIAL    . TONSILLECTOMY      Family History Family History  Problem Relation Age of Onset  . CAD  Father 29  . Cancer Mother   . Alzheimer's disease Mother   . Colon cancer Neg Hx      Social History  reports that he quit smoking about 47 years ago. He has a 15.00 pack-year smoking history. He has never used smokeless tobacco. He reports that he does not drink alcohol or use drugs.  Medications  Current Outpatient Medications:  .  carvedilol (COREG) 6.25 MG tablet, Take 1 tablet (6.25 mg total) by mouth 2 (two) times daily., Disp: 180 tablet, Rfl: 3 .  chlorambucil (LEUKERAN) 2 MG tablet, Take 15 tablets (60m total) on Day 1 and Day 15. ( every other week) Give on an empty stomach 1 hour before or 2 hours after meals., Disp: 30 tablet, Rfl: 0 .  Coenzyme Q10 (COQ-10) 200 MG CAPS, Take 400 mg by mouth every evening., Disp: , Rfl:  .  diphenhydramine-acetaminophen (TYLENOL PM) 25-500 MG TABS tablet, Take 1 tablet by mouth at bedtime as needed., Disp: , Rfl:  .  Multiple Vitamin (MULITIVITAMIN WITH MINERALS) TABS, Take 1 tablet by mouth daily., Disp: , Rfl:  .  predniSONE (DELTASONE) 10 MG tablet, Take 5 mg by mouth 2 (two) times a week. , Disp: , Rfl: 0 .  sodium bicarbonate 650 MG tablet, Take 1 tablet (650 mg total) by mouth 2 (two) times daily., Disp: 60 tablet, Rfl: 0 .  tamsulosin (FLOMAX) 0.4 MG CAPS capsule, Take 0.4 mg by mouth daily. , Disp: , Rfl:   Allergies Patient has no known allergies.  Review of Systems Review of Systems - Oncology ROS negative   Physical Exam  Vitals Wt Readings from Last 3 Encounters:  07/15/18 225 lb (102.1 kg)  06/10/18 220 lb 1.6 oz (99.8 kg)  05/16/18 221 lb (100.2 kg)   Temp Readings from Last 3 Encounters:  07/15/18 97.7 F (36.5 C) (Oral)  06/10/18 97.7 F (36.5 C) (Oral)  05/13/18 (!) 97.4 F (36.3 C) (Oral)   BP Readings from Last 3 Encounters:  07/15/18 (!) 138/91  06/10/18 133/82  05/16/18 (!) 150/82   Pulse Readings from Last 3 Encounters:  07/15/18 68  06/10/18 (!) 58  05/16/18 86   Constitutional:  Well-developed, well-nourished, and in no distress.   HENT: Head: Normocephalic and atraumatic.  Mouth/Throat: No oropharyngeal exudate. Mucosa moist. Eyes: Pupils are equal, round, and reactive to light. Conjunctivae are normal. No scleral icterus.  Neck: Normal range of motion. Neck supple. No JVD present.  Cardiovascular: Normal rate, regular rhythm and normal heart sounds.  Exam reveals no gallop and no friction rub.   No murmur heard. Pulmonary/Chest: Effort normal and breath sounds normal. No respiratory distress. No wheezes.No rales.  Abdominal: Soft. Bowel sounds are normal. No distension. There is no tenderness. There is no guarding.  Musculoskeletal: No edema or tenderness.  Lymphadenopathy: No cervical, axillary or  supraclavicular adenopathy.  Neurological: Alert and oriented to person, place, and time. No cranial nerve deficit.  Skin: Skin is warm and dry. No rash noted. No erythema. No pallor.  Psychiatric: Affect and judgment normal.   Labs No visits with results within 3 Day(s) from this visit.  Latest known visit with results is:  Appointment on 07/08/2018  Component Date Value Ref Range Status  . WBC 07/08/2018 9.1  4.0 - 10.5 K/uL Final  . RBC 07/08/2018 3.97* 4.22 - 5.81 MIL/uL Final  . Hemoglobin 07/08/2018 12.6* 13.0 - 17.0 g/dL Final  . HCT 07/08/2018 38.0* 39.0 - 52.0 % Final  . MCV 07/08/2018 95.7  80.0 - 100.0 fL Final  . MCH 07/08/2018 31.7  26.0 - 34.0 pg Final  . MCHC 07/08/2018 33.2  30.0 - 36.0 g/dL Final  . RDW 07/08/2018 13.0  11.5 - 15.5 % Final  . Platelets 07/08/2018 70* 150 - 400 K/uL Final   Comment: PLATELET COUNT CONFIRMED BY SMEAR SPECIMEN CHECKED FOR CLOTS Immature Platelet Fraction may be clinically indicated, consider ordering this additional test [id number]   . nRBC 07/08/2018 0.0  0.0 - 0.2 % Final  . Neutrophils Relative % 07/08/2018 30  % Final  . Neutro Abs 07/08/2018 2.8  1.7 - 7.7 K/uL Final  . Lymphocytes Relative 07/08/2018 61   % Final  . Lymphs Abs 07/08/2018 5.5* 0.7 - 4.0 K/uL Final  . Monocytes Relative 07/08/2018 7  % Final  . Monocytes Absolute 07/08/2018 0.6  0.1 - 1.0 K/uL Final  . Eosinophils Relative 07/08/2018 2  % Final  . Eosinophils Absolute 07/08/2018 0.2  0.0 - 0.5 K/uL Final  . Basophils Relative 07/08/2018 0  % Final  . Basophils Absolute 07/08/2018 0.0  0.0 - 0.1 K/uL Final  . Immature Granulocytes 07/08/2018 0  % Final  . Abs Immature Granulocytes 07/08/2018 0.03  0.00 - 0.07 K/uL Final  . Reactive, Benign Lymphocytes 07/08/2018 PRESENT   Final  . Smudge Cells 07/08/2018 PRESENT   Final   Performed at [facility], [address] [place], [place] [address]  . Sodium 07/08/2018 141  135 - 145 mmol/L Final  . Potassium 07/08/2018 3.8  3.5 - 5.1 mmol/L Final  . Chloride 07/08/2018 108  98 - 111 mmol/L Final  . CO2 07/08/2018 26  22 - 32 mmol/L Final  . Glucose, Bld 07/08/2018 100* 70 - 99 mg/dL Final  . BUN 07/08/2018 39* 8 - 23 mg/dL Final  . Creatinine, Ser 07/08/2018 3.04* 0.61 - 1.24 mg/dL Final  . Calcium 07/08/2018 9.3  8.9 - 10.3 mg/dL Final  . Total Protein 07/08/2018 6.4* 6.5 - 8.1 g/dL Final  . Albumin 07/08/2018 4.3  3.5 - 5.0 g/dL Final  . AST 07/08/2018 19  15 - 41 U/L Final  . ALT 07/08/2018 18  0 - 44 U/L Final  . Alkaline Phosphatase 07/08/2018 35* 38 - 126 U/L Final  . Total Bilirubin 07/08/2018 1.0  0.3 - 1.2 mg/dL Final  . GFR calc non Af Amer 07/08/2018 19* >60 mL/min Final  . GFR calc Af Amer 07/08/2018 22* >60 mL/min Final  . Anion gap 07/08/2018 7  5 - 15 Final   Performed at [facility], [name], [place], [place] [address]  . LDH 07/08/2018 133  98 - 192 U/L Final   Performed at [facility], [address], [place],  [address]  . Uric Acid, Serum 07/08/2018 6.0  3.7 - 8.6 mg/dL Final   Performed  at [facility], [address] [place], [place] [address]  . Phosphorus 07/08/2018 3.3  2.5 - 4.6 mg/dL Final   Performed at [facility], [address], [place], [place] [address]     Pathology Orders Placed This Encounter  Procedures  . CBC with Differential    Standing Status:   Future    Standing Expiration Date:   07/16/2019  . Comprehensive metabolic panel    Standing Status:   Future    Standing Expiration Date:   07/16/2019  . Lactate dehydrogenase    Standing Status:   Future    Standing Expiration Date:   07/16/2019  . Uric acid    Standing Status:   Future    Standing Expiration Date:   07/16/2019  . CBC with Differential    Standing Status:   Standing    Number of Occurrences:   4    Standing Expiration Date:   10/14/2018  . Comprehensive metabolic panel    Standing Status:   Standing    Number of Occurrences:   4    Standing Expiration Date:   10/14/2018  . Lactate dehydrogenase    Standing Status:   Standing    Number of Occurrences:   4    Standing Expiration Date:   10/14/2018       Zoila Shutter MD

## 2018-07-16 ENCOUNTER — Encounter: Payer: Self-pay | Admitting: Gastroenterology

## 2018-07-17 ENCOUNTER — Other Ambulatory Visit: Payer: Self-pay

## 2018-07-17 ENCOUNTER — Ambulatory Visit: Payer: Medicare HMO | Admitting: Nurse Practitioner

## 2018-07-17 ENCOUNTER — Encounter: Payer: Self-pay | Admitting: Nurse Practitioner

## 2018-07-17 DIAGNOSIS — R948 Abnormal results of function studies of other organs and systems: Secondary | ICD-10-CM | POA: Diagnosis not present

## 2018-07-17 NOTE — Assessment & Plan Note (Signed)
The patient has chronic CLL.  Had a recent switch of chemotherapy agents due to adverse event including subdural hematoma requiring surgery.  Recent PET scan completed which showed new hypermetabolic area in the stomach.  He was subsequently referred to our office.  Possible differentials listed on the PET scan included gastritis.  He is not having overt GERD symptoms although asymptomatic gastritis is certainly within the realm of possibility.  At this point to further evaluate we will plan for an upper endoscopy.  Because he is asymptomatic from a GI standpoint we will have him follow-up based on post procedure recommendations.  Proceed with EGD with Dr. Oneida Alar in near future: the risks, benefits, and alternatives have been discussed with the patient in detail. The patient states understanding and desires to proceed.  The patient is not on any anticoagulants, anxiolytics, chronic pain medications, or antidepressants.  Conscious sedation should be adequate for his procedure.

## 2018-07-17 NOTE — Patient Instructions (Signed)
Your health issues we discussed today were:   Abnormal PET scan: 1. Because of your abnormal PET scan we will plan for an upper endoscopy to further evaluate your stomach 2. Further recommendations will be made after your upper endoscopy 3. Call us if you develop any GI symptoms  Overall I recommend:  1. Return for follow-up based on the recommendations made after your upper endoscopy 2. Call us if you have any questions or concerns  At [facility] we value your feedback. You may receive a survey about your visit today. Please share your experience as we strive to create trusting relationships with our patients to provide genuine, compassionate, quality care.  We appreciate your understanding and patience as we review any laboratory studies, imaging, and other diagnostic tests that are ordered as we care for you. Our office policy is 5 business days for review of these results, and any emergent or urgent results are addressed in a timely manner for your best interest. If you do not hear from our office in 1 week, please contact us.   We also encourage the use of MyChart, which contains your medical information for your review as well. If you are not enrolled in this feature, an access code is on this after visit summary for your convenience. Thank you for allowing [place] to be involved in your care.  It was great to see you today!  I hope you have a great day!!

## 2018-07-17 NOTE — Progress Notes (Addendum)
REVIEWED-NO ADDITIONAL RECOMMENDATIONS.  Primary Care Physician:  [name], MD Primary Gastroenterologist:  Dr. Oneida Alar  Chief Complaint  Patient presents with  . abnormal PET scan    stomach    HPI:   Andrew [name] is a 78 y.o. male who presents on referral from primary care for "new intense metabolic activity associated with the stomach."  The patient has a history of CLL and sees the cancer center at [facility].  Most recent visit dated 07/15/2018.  Diagnosed with stage IV CLL.  Progressive thrombocytopenia with platelets less than 100,000.  He underwent chemotherapy but at some point may have had to stop due to a subdural hematoma.  Updated PET scan completed 07/01/2018 found interval reduction in size of cervical, axillary, retroperitoneal, and iliac lymphadenopathy with lymph nodes none are normal size and with metabolic activity less than blood pool activity.  Interval reduction in size of the spleen.  However, there is a new intense metabolic activity associated with the stomach without mass lesion favor gastritis over lymphoma.  The patient was referred to GI for further evaluation.  Colonoscopy last completed 07/10/2011 which found 3 sessile polyps in the transverse, ascending, sigmoid colons.  Diverticula in the sigmoid to descending colon, internal hemorrhoids.  Surgical pathology found the polyps to be a mix of hyperplastic and tubular adenoma.  Recommended colonoscopy in 10 years (2023) with 1 hour time slot.  Today he states he's doing well overall. Denies abdominal pain, N/V, GERD or dyspepsia symptoms, hematochezia, melena, fever, chills, unintentional weight loss. Denies chest pain, dyspnea, dizziness, lightheadedness, syncope, near syncope. Denies any other upper or lower GI symptoms.  Past Medical History:  Diagnosis Date  . CKD (chronic kidney disease), stage IV ([place])   . CLL (chronic lymphocytic leukemia) ([place])   . Hypertension   . NICM (nonischemic cardiomyopathy)  ([place])    a. cath 2007, EF 35%, minimal CAD. b. EF normal 2018.  Marland Kitchen PAF (paroxysmal atrial fibrillation) ([place])   . Pinched nerve in neck   . SDH (subdural hematoma) ([place])   . Thrombocytopenia ([place])     Past Surgical History:  Procedure Laterality Date  . CATARACT EXTRACTION, BILATERAL    . COLONOSCOPY  07/10/2011   Procedure: COLONOSCOPY;  Surgeon: Dorothyann Peng, MD;  [facility];  Service: Endoscopy;  Laterality: N/A;  10:30 AM  . CRANIOTOMY N/A 10/14/2016   Procedure: CRANIOTOMY HEMATOMA EVACUATION SUBDURAL;  Surgeon: Jovita Gamma, MD;  Location: [place];  Service: Neurosurgery;  Laterality: N/A;  CRANIOTOMY HEMATOMA EVACUATION SUBDURAL  . THYROIDECTOMY, PARTIAL    . TONSILLECTOMY      Current Outpatient Medications  Medication Sig Dispense Refill  . carvedilol (COREG) 6.25 MG tablet Take 1 tablet (6.25 mg total) by mouth 2 (two) times daily. 180 tablet 3  . chlorambucil (LEUKERAN) 2 MG tablet Take 15 tablets (30mg  total) on Day 1 and Day 15. ( every other week) Give on an empty stomach 1 hour before or 2 hours after meals. 30 tablet 0  . Coenzyme Q10 (COQ-10) 200 MG CAPS Take 400 mg by mouth every evening.    . diphenhydramine-acetaminophen (TYLENOL PM) 25-500 MG TABS tablet Take 1 tablet by mouth at bedtime as needed.    . Multiple Vitamin (MULITIVITAMIN WITH MINERALS) TABS Take 1 tablet by mouth daily.    . predniSONE (DELTASONE) 10 MG tablet Take 5 mg by mouth 2 (two) times a week.   0  . sodium bicarbonate 650 MG tablet  Take 1 tablet (650 mg total) by mouth 2 (two) times daily. 60 tablet 0  . tamsulosin (FLOMAX) 0.4 MG CAPS capsule Take 0.4 mg by mouth daily.      No current facility-administered medications for this visit.     Allergies as of 07/17/2018  . (No Known Allergies)    Family History  Problem Relation Age of Onset  . CAD Father 18  . Cancer Mother   . Alzheimer's disease Mother   . Colon cancer Neg Hx     Social History   Socioeconomic  History  . Marital status: Married    Spouse name: Not on file  . Number of children: Not on file  . Years of education: Not on file  . Highest education level: Not on file  Occupational History  . Not on file  Social Needs  . Financial resource strain: Not on file  . Food insecurity:    Worry: Not on file    Inability: Not on file  . Transportation needs:    Medical: Not on file    Non-medical: Not on file  Tobacco Use  . Smoking status: Former Smoker    Packs/day: 1.00    Years: 15.00    Pack years: 15.00    Last attempt to quit: 01/26/1971    Years since quitting: 47.5  . Smokeless tobacco: Never Used  Substance and Sexual Activity  . Alcohol use: No  . Drug use: No  . Sexual activity: Not on file  Lifestyle  . Physical activity:    Days per week: Not on file    Minutes per session: Not on file  . Stress: Not on file  Relationships  . Social connections:    Talks on phone: Not on file    Gets together: Not on file    Attends religious service: Not on file    Active member of club or organization: Not on file    Attends meetings of clubs or organizations: Not on file    Relationship status: Not on file  . Intimate partner violence:    Fear of current or ex partner: Not on file    Emotionally abused: Not on file    Physically abused: Not on file    Forced sexual activity: Not on file  Other Topics Concern  . Not on file  Social History Narrative  . Not on file    Review of Systems: Complete ROS negative except as per HPI.    Physical Exam: BP (!) 144/99   Pulse 93   Temp (!) 96.5 F (35.8 C) (Oral)   Ht 6\' 2"  (1.88 m)   Wt 223 lb 3.2 oz (101.2 kg)   BMI 28.66 kg/m  General:   Alert and oriented. Pleasant and cooperative. Well-nourished and well-developed.  Eyes:  Without icterus, sclera clear and conjunctiva pink.  Ears:  Normal auditory acuity. Cardiovascular:  S1, S2 present without murmurs appreciated. Extremities without clubbing or  edema. Respiratory:  Clear to auscultation bilaterally. No wheezes, rales, or rhonchi. No distress.  Gastrointestinal:  +BS, soft, non-tender and non-distended. No HSM noted. No guarding or rebound. No masses appreciated.  Rectal:  Deferred  Musculoskalatal:  Symmetrical without gross deformities. Neurologic:  Alert and oriented x4;  grossly normal neurologically. Psych:  Alert and cooperative. Normal mood and affect. Heme/Lymph/Immune: No excessive bruising noted.    07/17/2018 2:49 PM   Disclaimer: This note was dictated with voice recognition software. Similar sounding words can inadvertently be transcribed  and may not be corrected upon review.

## 2018-07-18 NOTE — Progress Notes (Signed)
cc'ed to pcp °

## 2018-07-22 ENCOUNTER — Other Ambulatory Visit (HOSPITAL_COMMUNITY): Payer: Self-pay | Admitting: *Deleted

## 2018-07-22 DIAGNOSIS — C9110 Chronic lymphocytic leukemia of B-cell type not having achieved remission: Secondary | ICD-10-CM

## 2018-07-22 MED ORDER — CHLORAMBUCIL 2 MG PO TABS: ORAL_TABLET | ORAL | 0 refills | Status: DC

## 2018-07-22 NOTE — Telephone Encounter (Signed)
Chart reviewed, Leukeran refilled per Dr. Walden Field last office note.

## 2018-08-05 ENCOUNTER — Encounter (HOSPITAL_COMMUNITY): Payer: Self-pay | Admitting: *Deleted

## 2018-08-05 ENCOUNTER — Other Ambulatory Visit: Payer: Self-pay

## 2018-08-05 ENCOUNTER — Ambulatory Visit (HOSPITAL_COMMUNITY)
Admission: RE | Admit: 2018-08-05 | Discharge: 2018-08-05 | Disposition: A | Discharge status: Home / Self Care | Payer: Medicare HMO | Attending: Gastroenterology | Admitting: Gastroenterology

## 2018-08-05 ENCOUNTER — Encounter (HOSPITAL_COMMUNITY)
Admission: RE | Disposition: A | Discharge status: Home / Self Care | Payer: Self-pay | Source: Home / Self Care | Attending: Gastroenterology

## 2018-08-05 DIAGNOSIS — I129 Hypertensive chronic kidney disease with stage 1 through stage 4 chronic kidney disease, or unspecified chronic kidney disease: Secondary | ICD-10-CM | POA: Diagnosis not present

## 2018-08-05 DIAGNOSIS — K2970 Gastritis, unspecified, without bleeding: Secondary | ICD-10-CM | POA: Diagnosis not present

## 2018-08-05 DIAGNOSIS — Z79899 Other long term (current) drug therapy: Secondary | ICD-10-CM | POA: Diagnosis not present

## 2018-08-05 DIAGNOSIS — K228 Other specified diseases of esophagus: Secondary | ICD-10-CM | POA: Diagnosis not present

## 2018-08-05 DIAGNOSIS — K2270 Barrett's esophagus without dysplasia: Secondary | ICD-10-CM | POA: Insufficient documentation

## 2018-08-05 DIAGNOSIS — K2950 Unspecified chronic gastritis without bleeding: Secondary | ICD-10-CM | POA: Diagnosis not present

## 2018-08-05 DIAGNOSIS — Z8249 Family history of ischemic heart disease and other diseases of the circulatory system: Secondary | ICD-10-CM | POA: Insufficient documentation

## 2018-08-05 DIAGNOSIS — K449 Diaphragmatic hernia without obstruction or gangrene: Secondary | ICD-10-CM | POA: Insufficient documentation

## 2018-08-05 DIAGNOSIS — Z87891 Personal history of nicotine dependence: Secondary | ICD-10-CM | POA: Diagnosis not present

## 2018-08-05 DIAGNOSIS — N184 Chronic kidney disease, stage 4 (severe): Secondary | ICD-10-CM | POA: Diagnosis not present

## 2018-08-05 DIAGNOSIS — I480 Paroxysmal atrial fibrillation: Secondary | ICD-10-CM | POA: Diagnosis not present

## 2018-08-05 DIAGNOSIS — I2510 Atherosclerotic heart disease of native coronary artery without angina pectoris: Secondary | ICD-10-CM | POA: Diagnosis not present

## 2018-08-05 DIAGNOSIS — R948 Abnormal results of function studies of other organs and systems: Secondary | ICD-10-CM | POA: Diagnosis not present

## 2018-08-05 DIAGNOSIS — K3189 Other diseases of stomach and duodenum: Secondary | ICD-10-CM | POA: Insufficient documentation

## 2018-08-05 DIAGNOSIS — K2980 Duodenitis without bleeding: Secondary | ICD-10-CM

## 2018-08-05 HISTORY — PX: BIOPSY: SHX5522

## 2018-08-05 HISTORY — PX: ESOPHAGOGASTRODUODENOSCOPY: SHX5428

## 2018-08-05 SURGERY — EGD (ESOPHAGOGASTRODUODENOSCOPY)
Anesthesia: Moderate Sedation

## 2018-08-05 MED ORDER — HYDRALAZINE HCL 20 MG/ML IJ SOLN
10.0000 mg | Freq: Once | INTRAMUSCULAR | Status: AC
  Administered 2018-08-05: 10 mg via INTRAVENOUS

## 2018-08-05 MED ORDER — MIDAZOLAM HCL 5 MG/5ML IJ SOLN
INTRAMUSCULAR | Status: AC
  Filled 2018-08-05: qty 10

## 2018-08-05 MED ORDER — LIDOCAINE VISCOUS HCL 2 % MT SOLN
OROMUCOSAL | Status: DC | PRN
  Administered 2018-08-05: 1 via OROMUCOSAL

## 2018-08-05 MED ORDER — MIDAZOLAM HCL 5 MG/5ML IJ SOLN
INTRAMUSCULAR | Status: DC | PRN
  Administered 2018-08-05 (×2): 2 mg via INTRAVENOUS
  Administered 2018-08-05: 1 mg via INTRAVENOUS

## 2018-08-05 MED ORDER — LIDOCAINE VISCOUS HCL 2 % MT SOLN
OROMUCOSAL | Status: AC
  Filled 2018-08-05: qty 15

## 2018-08-05 MED ORDER — HYDRALAZINE HCL 20 MG/ML IJ SOLN
INTRAMUSCULAR | Status: AC
  Filled 2018-08-05: qty 1

## 2018-08-05 MED ORDER — FENTANYL CITRATE (PF) 100 MCG/2ML IJ SOLN
INTRAMUSCULAR | Status: AC
  Filled 2018-08-05: qty 2

## 2018-08-05 MED ORDER — MEPERIDINE HCL 100 MG/ML IJ SOLN
INTRAMUSCULAR | Status: AC
  Filled 2018-08-05: qty 2

## 2018-08-05 MED ORDER — FENTANYL CITRATE (PF) 100 MCG/2ML IJ SOLN
INTRAMUSCULAR | Status: DC | PRN
  Administered 2018-08-05 (×2): 25 ug via INTRAVENOUS

## 2018-08-05 MED ORDER — SODIUM CHLORIDE 0.9 % IV SOLN
INTRAVENOUS | Status: DC
  Administered 2018-08-05: 15:00:00 via INTRAVENOUS

## 2018-08-05 MED ORDER — STERILE WATER FOR IRRIGATION IR SOLN
Status: DC | PRN
  Administered 2018-08-05: 1.5 mL

## 2018-08-05 NOTE — H&P (Addendum)
Primary Care Physician:  [name], MD Primary Gastroenterologist:  Dr. Oneida Alar  Pre-Procedure History & Physical: HPI:  Andrew [name] is a 78 y.o. male here for Abnormal PET scan: STOMACH(DIFFUSE UPTAKE PER DR. MAXWELL). Pt denies ASA/NSAID use.  Past Medical History:  Diagnosis Date  . CKD (chronic kidney disease), stage IV ([place])   . CLL (chronic lymphocytic leukemia) ([place])   . Hypertension   . NICM (nonischemic cardiomyopathy) ([place])    a. cath 2007, EF 35%, minimal CAD. b. EF normal 2018.  Marland Kitchen PAF (paroxysmal atrial fibrillation) ([place])   . Pinched nerve in neck   . SDH (subdural hematoma) ([place])   . Thrombocytopenia ([place])     Past Surgical History:  Procedure Laterality Date  . CATARACT EXTRACTION, BILATERAL    . COLONOSCOPY  07/10/2011   Procedure: COLONOSCOPY;  Surgeon: Dorothyann Peng, MD;  [facility];  Service: Endoscopy;  Laterality: N/A;  10:30 AM  . CRANIOTOMY N/A 10/14/2016   Procedure: CRANIOTOMY HEMATOMA EVACUATION SUBDURAL;  Surgeon: Jovita Gamma, MD;  Location: [place];  Service: Neurosurgery;  Laterality: N/A;  CRANIOTOMY HEMATOMA EVACUATION SUBDURAL  . THYROIDECTOMY, PARTIAL    . TONSILLECTOMY      Prior to Admission medications   Medication Sig Start Date End Date Taking? Authorizing Provider  carvedilol (COREG) 6.25 MG tablet Take 1 tablet (6.25 mg total) by mouth 2 (two) times daily. 05/16/18  Yes Branch, Alphonse Guild, MD  chlorambucil (LEUKERAN) 2 MG tablet Take 15 tablets (30mg  total) on Day 1 and Day 15. ( every other week) Give on an empty stomach 1 hour before or 2 hours after meals. Patient taking differently: Take 30 mg by mouth every 14 (fourteen) days. Take 15 tablets (30mg  total) on Day 1 and Day 15. ( every other week) Give on an empty stomach 1 hour before or 2 hours after meals. 07/22/18  Yes Higgs, Mathis Dad, MD  Coenzyme Q10 (COQ-10) 200 MG CAPS Take 200 mg by mouth 2 (two) times daily.    Yes [provider]   diphenhydramine-acetaminophen (TYLENOL PM) 25-500 MG TABS tablet Take 1 tablet by mouth at bedtime as needed (sleep).    Yes [provider]  Multiple Vitamin (MULITIVITAMIN WITH MINERALS) TABS Take 1 tablet by mouth daily.   Yes [provider]  sodium bicarbonate 650 MG tablet Take 1 tablet (650 mg total) by mouth 2 (two) times daily. 12/22/17  Yes Georgette Shell, MD  tamsulosin (FLOMAX) 0.4 MG CAPS capsule Take 0.4 mg by mouth daily.  07/19/17  Yes [provider]    Allergies as of 07/17/2018  . (No Known Allergies)    Family History  Problem Relation Age of Onset  . CAD Father 88  . Cancer Mother   . Alzheimer's disease Mother   . Colon cancer Neg Hx     Social History   Socioeconomic History  . Marital status: Married    Spouse name: Not on file  . Number of children: Not on file  . Years of education: Not on file  . Highest education level: Not on file  Occupational History  . Not on file  Social Needs  . Financial resource strain: Not on file  . Food insecurity:    Worry: Not on file    Inability: Not on file  . Transportation needs:    Medical: Not on file    Non-medical: Not on file  Tobacco Use  . Smoking status: Former Smoker  Packs/day: 1.00    Years: 15.00    Pack years: 15.00    Last attempt to quit: 01/26/1971    Years since quitting: 47.5  . Smokeless tobacco: Never Used  Substance and Sexual Activity  . Alcohol use: No  . Drug use: No  . Sexual activity: Not on file  Lifestyle  . Physical activity:    Days per week: Not on file    Minutes per session: Not on file  . Stress: Not on file  Relationships  . Social connections:    Talks on phone: Not on file    Gets together: Not on file    Attends religious service: Not on file    Active member of club or organization: Not on file    Attends meetings of clubs or organizations: Not on file    Relationship status: Not on file  . Intimate partner violence:     Fear of current or ex partner: Not on file    Emotionally abused: Not on file    Physically abused: Not on file    Forced sexual activity: Not on file  Other Topics Concern  . Not on file  Social History Narrative  . Not on file    Review of Systems: See HPI, otherwise negative ROS   Physical Exam: BP (!) 160/103   Pulse 91   Temp 97.6 F (36.4 C)   Resp 11   Ht 6\' 2"  (1.88 m)   Wt 101.2 kg   SpO2 100%   BMI 28.66 kg/m  General:   Alert,  pleasant and cooperative in NAD Head:  Normocephalic and atraumatic. Neck:  Supple; Lungs:  Clear throughout to auscultation.    Heart:  Regular rate and rhythm. Abdomen:  Soft, nontender and nondistended. Normal bowel sounds, without guarding, and without rebound.   Neurologic:  Alert and  oriented x4;  grossly normal neurologically.  Impression/Plan:     Abnormal PET scan  Plan:  EGD TODAY. DISCUSSED PROCEDURE, BENEFITS, & RISKS: < 1% chance of medication reaction, bleeding due to low platelet count, OR perforation requiring surgery to fix it.Marland Kitchen

## 2018-08-05 NOTE — Discharge Instructions (Signed)
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 YOU HAVE A SMALL HIATAL HERNIA. I biopsied your ESOPHAGUS & stomach.     AVOID TRIGGERS FOR GASTRITIS. SEE INFO BELOW.  FOLLOW A LOW FAT DIET. SEE INFO BELOW.  FOLLOW UP IN THE OFFICE IN 2 YEARS.  REPEAT EGD IN 3-5 YEARS.   UPPER ENDOSCOPY AFTER CARE Read the instructions outlined below and refer to this sheet in the next week. These discharge instructions provide you with general information on caring for yourself after you leave the hospital. While your treatment has been planned according to the most current medical practices available, unavoidable complications occasionally occur. If you have any problems or questions after discharge, call DR. FIELDS, [phone].  ACTIVITY  You may resume your regular activity, but move at a slower pace for the next 24 hours.   Take frequent rest periods for the next 24 hours.   Walking will help get rid of the air and reduce the bloated feeling in your belly (abdomen).   No driving for 24 hours (because of the medicine (anesthesia) used during the test).   You may shower.   Do not sign any important legal documents or operate any machinery for 24 hours (because of the anesthesia used during the test).    NUTRITION  Drink plenty of fluids.   You may resume your normal diet as instructed by your doctor.   Begin with a light meal and progress to your normal diet. Heavy or fried foods are harder to digest and may make you feel sick to your stomach (nauseated).   Avoid alcoholic beverages for 24 hours or as instructed.    MEDICATIONS  You may resume your normal medications.   WHAT YOU CAN EXPECT TODAY  Some feelings of bloating in the abdomen.   Passage of more gas than usual.    IF YOU HAD A BIOPSY TAKEN DURING THE UPPER ENDOSCOPY:  Eat a soft diet IF YOU HAVE NAUSEA, BLOATING, ABDOMINAL PAIN, OR  VOMITING.    FINDING OUT THE RESULTS OF YOUR TEST Not all test results are available during your visit. DR. Oneida Alar WILL CALL YOU WITHIN 7 DAYS OF YOUR PROCEDUE WITH YOUR RESULTS. Do not assume everything is normal if you have not heard from DR. FIELDS IN ONE WEEK, CALL HER OFFICE AT [phone].  SEEK IMMEDIATE MEDICAL ATTENTION AND CALL THE OFFICE: [phone] IF:  You have more than a spotting of blood in your stool.   Your belly is swollen (abdominal distention).   You are nauseated or vomiting.   You have a temperature over 101F.   You have abdominal pain or discomfort that is severe or gets worse throughout the day.   Hiatal Hernia A hiatal hernia occurs when a part of the stomach slides above the diaphragm. The diaphragm is the thin muscle separating the belly (abdomen) from the chest. A hiatal hernia can be something you are born with or develop over time. Hiatal hernias may allow stomach acid to flow back into your esophagus, the tube which carries food from your mouth to your stomach. If this acid causes problems it is called GERD (gastro-esophageal reflux disease).    Gastritis/DUODENITIS  Gastritis is an inflammation (the body's way of reacting to injury and/or infection) of the stomach. DUODENITIS is an inflammation (the body's way of reacting to injury and/or infection) of the FIRST PART OF THE SMALL INTESTINES. It is often  caused by bacterial (germ) infections. It can also be caused BY ASPIRIN, BC/GOODY POWDER'S, (IBUPROFEN) MOTRIN, OR ALEVE (NAPROXEN), chemicals (including alcohol), SPICY FOODS, and medications. This illness may be associated with generalized malaise (feeling tired, not well), UPPER ABDOMINAL STOMACH cramps, and fever. One common bacterial cause of gastritis is an organism known as H. Pylori. This can be treated with antibiotics.    PATIENT INSTRUCTIONS POST-ANESTHESIA  IMMEDIATELY FOLLOWING SURGERY:  Do not drive or operate machinery for the first  twenty four hours after surgery.  Do not make any important decisions for twenty four hours after surgery or while taking narcotic pain medications or sedatives.  If you develop intractable nausea and vomiting or a severe headache please notify your doctor immediately.  FOLLOW-UP:  Please make an appointment with your surgeon as instructed. You do not need to follow up with anesthesia unless specifically instructed to do so.  WOUND CARE INSTRUCTIONS (if applicable):  Keep a dry clean dressing on the anesthesia/puncture wound site if there is drainage.  Once the wound has quit draining you may leave it open to air.  Generally you should leave the bandage intact for twenty four hours unless there is drainage.  If the epidural site drains for more than 36-48 hours please call the anesthesia department.  QUESTIONS?:  Please feel free to call your physician or the hospital operator if you have any questions, and they will be happy to assist you.

## 2018-08-05 NOTE — Op Note (Signed)
[facility] Patient Name: Andrew [name] Procedure Date: 08/05/2018 3:07 PM MRN: [id number] Date of Birth: Jun 05, 1940 Attending MD: Barney Drain MD, MD CSN: [id number] Age: 78 Admit Type: Outpatient Procedure:                Upper GI endoscopy WITH COLD FORCEPS BIOPSY Indications:              Abnormal PET scan of the GI tract FEB 2020: DIFFUSE                            CHANGES IN STOMACH, CT SCAN SHOWS NL GASTRIC WALL                            THICKNESS. I PERSONALLY REVIEWED FILMS WITH DR.                            MAXWELL. Providers:                Barney Drain MD, MD, Janeece Riggers, RN, Randa Spike, Technician Referring MD:             Zoila Shutter, MD Medicines:                Fentanyl 50 micrograms IV, Midazolam 5 mg IV Complications:            No immediate complications. Estimated Blood Loss:     Estimated blood loss was minimal. Procedure:                Pre-Anesthesia Assessment:                           - Prior to the procedure, a History and Physical                            was performed, and patient medications and                            allergies were reviewed. The patient's tolerance of                            previous anesthesia was also reviewed. The risks                            and benefits of the procedure and the sedation                            options and risks were discussed with the patient.                            All questions were answered, and informed consent                            was obtained. Prior Anticoagulants: The patient has  taken no previous anticoagulant or antiplatelet                            agents. ASA Grade Assessment: II - A patient with                            mild systemic disease. After reviewing the risks                            and benefits, the patient was deemed in                            satisfactory condition to undergo the procedure.                             After obtaining informed consent, the endoscope was                            passed under direct vision. Throughout the                            procedure, the patient's blood pressure, pulse, and                            oxygen saturations were monitored continuously. The                            GIF-H190 ([id number]) scope was introduced through the                            mouth, and advanced to the second part of duodenum.                            The upper GI endoscopy was accomplished without                            difficulty. The patient tolerated the procedure                            well. Scope In: 3:37:54 PM Scope Out: 3:49:49 PM Total Procedure Duration: 0 hours 11 minutes 55 seconds  Findings:      Circumferential salmon-colored mucosa was present from 41 to 45 cm.       Scattered islands of squamous mucosa were present. The maximum       longitudinal extent of these esophageal mucosal changes was 6 cm in       length. Mucosa was biopsied with a cold forceps for histology in 4       quadrants at intervals of 2 cm. The following biopsy specimens were sent       to pathology: 4 quadrant biopsy at 43 cm from the incisors (3rd Bottle),       4 quadrant biopsy at 41 cm from the incisors (4th Bottle) and 4 quadrant       biopsy at 39 cm from the incisors (5th Bottle). A  total of 3 specimen       bottles were sent to pathology. EGJ 4 CM FROM THE INCISORS.      A medium-sized hiatal hernia was present.      Diffuse mild inflammation characterized by congestion (edema), erythema       and friability was found in the entire examined stomach. Biopsies{(BTL       1: 3 ANTRUM/INCISURA(3); BTL : BODY(3)] were taken with a cold forceps       for Helicobacter pylori testing.      Patchy mild inflammation characterized by congestion (edema) and       erythema was found in the duodenal bulb.      The second portion of the duodenum was  normal. Impression:               - FINDINGS ON EGD EXPLAIN CHANGES ON PET SCAN:                            Barrett's esophagus/GASTRITIS/DUODENITIS.                           - Medium-sized hiatal hernia. Moderate Sedation:      Moderate (conscious) sedation was administered by the endoscopy nurse       and supervised by the endoscopist. The following parameters were       monitored: oxygen saturation, heart rate, blood pressure, and response       to care. Total physician intraservice time was 24 minutes. Recommendation:           - Patient has a contact number available for                            emergencies. The signs and symptoms of potential                            delayed complications were discussed with the                            patient. Return to normal activities tomorrow.                            Written discharge instructions were provided to the                            patient.                           - Resume previous diet.                           - Continue present medications. CONSIDER PPI.                           - Await pathology results.                           - Repeat upper endoscopy after studies are complete  for surveillance based on pathology results.                           - Return to GI clinic in 2 years. Procedure Code(s):        --- Professional ---                           [phone], Esophagogastroduodenoscopy, flexible,                            transoral; with biopsy, single or multiple                           99153, Moderate sedation; each additional 15                            minutes intraservice time                           G0500, Moderate sedation services provided by the                            same physician or other qualified health care                            professional performing a gastrointestinal                            endoscopic service that sedation supports,                             requiring the presence of an independent trained                            observer to assist in the monitoring of the                            patient's level of consciousness and physiological                            status; initial 15 minutes of intra-service time;                            patient age 22 years or older (additional time may                            be reported with [id number], as appropriate) Diagnosis Code(s):        --- Professional ---                           K22.8, Other specified diseases of esophagus                           K44.9, Diaphragmatic hernia without obstruction or  gangrene                           K29.70, Gastritis, unspecified, without bleeding                           K29.80, Duodenitis without bleeding                           R93.3, Abnormal findings on diagnostic imaging of                            other parts of digestive tract CPT copyright 2018 [employer]. All rights reserved. The codes documented in this report are preliminary and upon coder review may  be revised to meet current compliance requirements. Barney Drain, MD Barney Drain MD, MD 08/05/2018 4:10:43 PM This report has been signed electronically. Number of Addenda: 0

## 2018-08-08 ENCOUNTER — Encounter (HOSPITAL_COMMUNITY): Payer: Self-pay | Admitting: Gastroenterology

## 2018-08-12 ENCOUNTER — Telehealth: Payer: Self-pay | Admitting: Gastroenterology

## 2018-08-12 ENCOUNTER — Other Ambulatory Visit (HOSPITAL_COMMUNITY): Payer: Self-pay | Admitting: *Deleted

## 2018-08-12 DIAGNOSIS — C9110 Chronic lymphocytic leukemia of B-cell type not having achieved remission: Secondary | ICD-10-CM

## 2018-08-12 MED ORDER — OMEPRAZOLE 20 MG PO CPDR: DELAYED_RELEASE_CAPSULE | ORAL | 3 refills | Status: DC

## 2018-08-12 MED ORDER — CHLORAMBUCIL 2 MG PO TABS: ORAL_TABLET | ORAL | 0 refills | Status: DC

## 2018-08-12 NOTE — Progress Notes (Signed)
PT is aware of the results and recommendations. He needs the prescription sent to [facility] in [place]. I am printing this info and mailing to him per his request.

## 2018-08-12 NOTE — Progress Notes (Signed)
Forwarding to [place] to nic the appointment.

## 2018-08-12 NOTE — Progress Notes (Signed)
ON RECALL AND CC'D TO PCP °

## 2018-08-12 NOTE — Telephone Encounter (Signed)
Chart reviewed, leukeran refilled.

## 2018-08-12 NOTE — Telephone Encounter (Signed)
RX FOR OMEPRAZOLE SENT.

## 2018-08-12 NOTE — Progress Notes (Signed)
ON RECALL  °

## 2018-08-13 NOTE — Progress Notes (Signed)
On recall  °

## 2018-08-19 ENCOUNTER — Other Ambulatory Visit (HOSPITAL_COMMUNITY): Payer: Medicare HMO

## 2018-08-20 ENCOUNTER — Inpatient Hospital Stay (HOSPITAL_COMMUNITY): Payer: Medicare HMO | Attending: Hematology

## 2018-08-20 ENCOUNTER — Ambulatory Visit (HOSPITAL_COMMUNITY): Payer: Medicare HMO | Admitting: Hematology

## 2018-08-20 ENCOUNTER — Other Ambulatory Visit: Payer: Self-pay

## 2018-08-20 DIAGNOSIS — C9110 Chronic lymphocytic leukemia of B-cell type not having achieved remission: Secondary | ICD-10-CM

## 2018-08-20 LAB — COMPREHENSIVE METABOLIC PANEL
ALT: 12 U/L (ref 0–44)
ANION GAP: 10 (ref 5–15)
AST: 17 U/L (ref 15–41)
Albumin: 4.4 g/dL (ref 3.5–5.0)
Alkaline Phosphatase: 46 U/L (ref 38–126)
BUN: 41 mg/dL — ABNORMAL HIGH (ref 8–23)
CO2: 21 mmol/L — ABNORMAL LOW (ref 22–32)
Calcium: 9.2 mg/dL (ref 8.9–10.3)
Chloride: 111 mmol/L (ref 98–111)
Creatinine, Ser: 2.99 mg/dL — ABNORMAL HIGH (ref 0.61–1.24)
GFR calc Af Amer: 22 mL/min — ABNORMAL LOW (ref >60)
GFR calc non Af Amer: 19 mL/min — ABNORMAL LOW (ref >60)
Glucose, Bld: 131 mg/dL — ABNORMAL HIGH (ref 70–99)
Potassium: 3.5 mmol/L (ref 3.5–5.1)
Sodium: 142 mmol/L (ref 135–145)
Total Bilirubin: 0.7 mg/dL (ref 0.3–1.2)
Total Protein: 6.6 g/dL (ref 6.5–8.1)

## 2018-08-20 LAB — CBC WITH DIFFERENTIAL/PLATELET
Abs Immature Granulocytes: 0.02 10*3/uL (ref 0.00–0.07)
BASOS PCT: 0 %
Basophils Absolute: 0 10*3/uL (ref 0.0–0.1)
Eosinophils Absolute: 0.2 10*3/uL (ref 0.0–0.5)
Eosinophils Relative: 2 %
HCT: 36.8 % — ABNORMAL LOW (ref 39.0–52.0)
HEMOGLOBIN: 12.3 g/dL — AB (ref 13.0–17.0)
Immature Granulocytes: 0 %
Lymphocytes Relative: 49 %
Lymphs Abs: 3.7 10*3/uL (ref 0.7–4.0)
MCH: 31.9 pg (ref 26.0–34.0)
MCHC: 33.4 g/dL (ref 30.0–36.0)
MCV: 95.6 fL (ref 80.0–100.0)
Monocytes Absolute: 1.3 10*3/uL — ABNORMAL HIGH (ref 0.1–1.0)
Monocytes Relative: 17 %
NEUTROS ABS: 2.5 10*3/uL (ref 1.7–7.7)
Neutrophils Relative %: 32 %
PLATELETS: 67 10*3/uL — AB (ref 150–400)
RBC: 3.85 MIL/uL — ABNORMAL LOW (ref 4.22–5.81)
RDW: 13.1 % (ref 11.5–15.5)
WBC: 7.7 10*3/uL (ref 4.0–10.5)
nRBC: 0 % (ref 0.0–0.2)

## 2018-08-20 LAB — LACTATE DEHYDROGENASE: LDH: 118 U/L (ref 98–192)

## 2018-08-21 DIAGNOSIS — I10 Essential (primary) hypertension: Secondary | ICD-10-CM | POA: Diagnosis not present

## 2018-08-21 DIAGNOSIS — R51 Headache: Secondary | ICD-10-CM | POA: Diagnosis not present

## 2018-08-26 DIAGNOSIS — I10 Essential (primary) hypertension: Secondary | ICD-10-CM | POA: Diagnosis not present

## 2018-08-26 DIAGNOSIS — R51 Headache: Secondary | ICD-10-CM | POA: Diagnosis not present

## 2018-08-28 ENCOUNTER — Other Ambulatory Visit (HOSPITAL_COMMUNITY): Payer: Self-pay | Admitting: *Deleted

## 2018-08-28 DIAGNOSIS — C9110 Chronic lymphocytic leukemia of B-cell type not having achieved remission: Secondary | ICD-10-CM

## 2018-08-28 MED ORDER — CHLORAMBUCIL 2 MG PO TABS: ORAL_TABLET | ORAL | 0 refills | Status: DC

## 2018-08-29 ENCOUNTER — Encounter (HOSPITAL_COMMUNITY): Payer: Self-pay | Admitting: Hematology

## 2018-08-29 ENCOUNTER — Inpatient Hospital Stay (HOSPITAL_COMMUNITY): Payer: Medicare HMO | Attending: Hematology | Admitting: Hematology

## 2018-08-29 ENCOUNTER — Other Ambulatory Visit: Payer: Self-pay

## 2018-08-29 DIAGNOSIS — Z87891 Personal history of nicotine dependence: Secondary | ICD-10-CM | POA: Insufficient documentation

## 2018-08-29 DIAGNOSIS — R161 Splenomegaly, not elsewhere classified: Secondary | ICD-10-CM

## 2018-08-29 DIAGNOSIS — I700 Atherosclerosis of aorta: Secondary | ICD-10-CM | POA: Diagnosis not present

## 2018-08-29 DIAGNOSIS — D696 Thrombocytopenia, unspecified: Secondary | ICD-10-CM | POA: Diagnosis not present

## 2018-08-29 DIAGNOSIS — I429 Cardiomyopathy, unspecified: Secondary | ICD-10-CM

## 2018-08-29 DIAGNOSIS — Z809 Family history of malignant neoplasm, unspecified: Secondary | ICD-10-CM | POA: Diagnosis not present

## 2018-08-29 DIAGNOSIS — I4891 Unspecified atrial fibrillation: Secondary | ICD-10-CM | POA: Diagnosis not present

## 2018-08-29 DIAGNOSIS — K2270 Barrett's esophagus without dysplasia: Secondary | ICD-10-CM | POA: Diagnosis not present

## 2018-08-29 DIAGNOSIS — Z79899 Other long term (current) drug therapy: Secondary | ICD-10-CM | POA: Diagnosis not present

## 2018-08-29 DIAGNOSIS — C9110 Chronic lymphocytic leukemia of B-cell type not having achieved remission: Secondary | ICD-10-CM | POA: Diagnosis not present

## 2018-08-29 DIAGNOSIS — I129 Hypertensive chronic kidney disease with stage 1 through stage 4 chronic kidney disease, or unspecified chronic kidney disease: Secondary | ICD-10-CM

## 2018-08-29 DIAGNOSIS — Z9221 Personal history of antineoplastic chemotherapy: Secondary | ICD-10-CM | POA: Diagnosis not present

## 2018-08-29 DIAGNOSIS — N184 Chronic kidney disease, stage 4 (severe): Secondary | ICD-10-CM

## 2018-08-29 NOTE — Patient Instructions (Addendum)
[facility] at [facility] Discharge Instructions  You were seen today by Dr. Katragadda. He went over your recent lab results. He will see you back in 6 weeks for labs and follow up.   Thank you for choosing [facility] at [facility] to provide your oncology and hematology care.  To afford each patient quality time with our provider, please arrive at least 15 minutes before your scheduled appointment time.   If you have a lab appointment with the Cancer Center please come in thru the  Main Entrance and check in at the main information desk  You need to re-schedule your appointment should you arrive 10 or more minutes late.  We strive to give you quality time with our providers, and arriving late affects you and other patients whose appointments are after yours.  Also, if you no show three or more times for appointments you may be dismissed from the clinic at the providers discretion.     Again, thank you for choosing [facility].  Our hope is that these requests will decrease the amount of time that you wait before being seen by our physicians.       _____________________________________________________________  Should you have questions after your visit to [facility], please contact our office at [phone] between the hours of 8:00 a.m. and 4:30 p.m.  Voicemails left after 4:00 p.m. will not be returned until the following business day.  For prescription refill requests, have your pharmacy contact our office and allow 72 hours.    Cancer Center Support Programs:   > Cancer Support Group  2nd Tuesday of the month 1pm-2pm, Journey Room    

## 2018-08-29 NOTE — Progress Notes (Signed)
Andrew [name], [place] [id number]   CLINIC:  Medical Oncology/Hematology  PCP:  [name], MD [place] [address] [phone]   REASON FOR VISIT:  Follow-up for CLL (chronic lymphocytic leukemia)   BRIEF ONCOLOGIC HISTORY:    CLL (chronic lymphocytic leukemia) ([place])   03/09/2015 Initial Diagnosis    CLL (chronic lymphocytic leukemia) ([place])    08/03/2016 Imaging    CT neck- 1. Diffuse and bulky lymphadenopathy in keeping with history of CLL. Nodal enlargement has diffusely progressed since April 2016. 2. Chest and abdomen CT reported separately.    08/03/2016 Imaging    CT CAP- 1. Bulky lymphadenopathy in the chest, abdomen, and pelvis as described. 2. Splenomegaly. 3. Coronary artery and thoracoabdominal aortic atherosclerosis. 4. Prostatomegaly. 5. Several tiny lucent lesions in the bony anatomic pelvis, nonspecific, but attention on follow-up imaging recommended.    08/10/2016 Procedure    Bone marrow aspiration and biopsy by IR.    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 PERIPHERAL BLOOD: - CHRONIC LYMPHOCYTIC LEUKEMIA. - THROMBOCYTOPENIA.    08/11/2016 Pathology Results    Bone Marrow Flow Cytometry - CHRONIC LYMPHOCYTIC LEUKEMIA/SMALL LYMPHOCYTIC LYMPHOMA.    08/16/2016 Pathology Results    Normal cytogenetics.  Molecular cytogenetic analysis by Bernice demonstrates a 13q-    08/19/2016 -  Chemotherapy    Imbruvica 420 mg daily       CANCER STAGING: Cancer Staging CLL (chronic lymphocytic leukemia) ([place]) Staging form: Chronic Lymphocytic Leukemia / Small Lymphocytic Lymphoma, AJCC 8th Edition - Clinical stage from 09/04/2016: Modified Rai Stage IV (Binet: Stage C, Modified Rai risk: High, Lymphocytosis: Present, Adenopathy: Present, Organomegaly: Present, Anemia: Absent,  Thrombocytopenia: Present) - Signed by Baird Cancer, PA-C on 09/04/2016    INTERVAL HISTORY:  Andrew [name] 78 y.o. male returns for routine follow-up. He is here today alone. He states that he has an appointment tomorrow with his PCP about his elevated BP. He states that he has been feeling good since his last visit. He has had endoscopy recently. Denies any nausea, vomiting, or diarrhea. Denies any new pains. Had not noticed any recent bleeding such as epistaxis, hematuria or hematochezia. Denies recent chest pain on exertion, shortness of breath on minimal exertion, pre-syncopal episodes, or palpitations. Denies any numbness or tingling in hands or feet. Denies any recent fevers, infections, or recent hospitalizations. Patient reports appetite at 100% and energy level at 50%.    REVIEW OF SYSTEMS:  Review of Systems  Neurological: Positive for headaches.  All other systems reviewed and are negative.    PAST MEDICAL/SURGICAL HISTORY:  Past Medical History:  Diagnosis Date  . CKD (chronic kidney disease), stage IV ([place])   . CLL (chronic lymphocytic leukemia) ([place])   . Hypertension   . NICM (nonischemic cardiomyopathy) ([place])    a. cath 2007, EF 35%, minimal CAD. b. EF normal 2018.  Marland Kitchen PAF (paroxysmal atrial fibrillation) ([place])   . Pinched nerve in neck   . SDH (subdural hematoma) ([place])   . Thrombocytopenia ([place])    Past Surgical History:  Procedure Laterality Date  . BIOPSY  08/05/2018   Procedure: BIOPSY;  Surgeon: Danie Binder, MD;  [facility];  Service: Endoscopy;;  gastric   . CATARACT EXTRACTION, BILATERAL    . COLONOSCOPY  07/10/2011   Procedure: COLONOSCOPY;  Surgeon: Dorothyann Peng,  MD;  [facility];  Service: Endoscopy;  Laterality: N/A;  10:30 AM  . CRANIOTOMY N/A 10/14/2016   Procedure: CRANIOTOMY HEMATOMA EVACUATION SUBDURAL;  Surgeon: Jovita Gamma, MD;  Location: [facility];  Service: Neurosurgery;  Laterality: N/A;  CRANIOTOMY HEMATOMA  EVACUATION SUBDURAL  . ESOPHAGOGASTRODUODENOSCOPY N/A 08/05/2018   Procedure: ESOPHAGOGASTRODUODENOSCOPY (EGD);  Surgeon: Danie Binder, MD;  [facility];  Service: Endoscopy;  Laterality: N/A;  3:00pm  . THYROIDECTOMY, PARTIAL    . TONSILLECTOMY       SOCIAL HISTORY:  Social History   Socioeconomic History  . Marital status: Married    Spouse name: Not on file  . Number of children: Not on file  . Years of education: Not on file  . Highest education level: Not on file  Occupational History  . Not on file  Social Needs  . Financial resource strain: Not on file  . Food insecurity:    Worry: Not on file    Inability: Not on file  . Transportation needs:    Medical: Not on file    Non-medical: Not on file  Tobacco Use  . Smoking status: Former Smoker    Packs/day: 1.00    Years: 15.00    Pack years: 15.00    Last attempt to quit: 01/26/1971    Years since quitting: 47.6  . Smokeless tobacco: Never Used  Substance and Sexual Activity  . Alcohol use: No  . Drug use: No  . Sexual activity: Not on file  Lifestyle  . Physical activity:    Days per week: Not on file    Minutes per session: Not on file  . Stress: Not on file  Relationships  . Social connections:    Talks on phone: Not on file    Gets together: Not on file    Attends religious service: Not on file    Active member of club or organization: Not on file    Attends meetings of clubs or organizations: Not on file    Relationship status: Not on file  . Intimate partner violence:    Fear of current or ex partner: Not on file    Emotionally abused: Not on file    Physically abused: Not on file    Forced sexual activity: Not on file  Other Topics Concern  . Not on file  Social History Narrative  . Not on file    FAMILY HISTORY:  Family History  Problem Relation Age of Onset  . CAD Father 57  . Cancer Mother   . Alzheimer's disease Mother   . Colon cancer Neg Hx     CURRENT MEDICATIONS:   Outpatient Encounter Medications as of 08/29/2018  Medication Sig  . carvedilol (COREG) 6.25 MG tablet Take 1 tablet (6.25 mg total) by mouth 2 (two) times daily.  . chlorambucil (LEUKERAN) 2 MG tablet Take 15 tablets (50m total) on Day 1 and Day 15. ( every other week) Give on an empty stomach 1 hour before or 2 hours after meals.  . Coenzyme Q10 (COQ-10) 200 MG CAPS Take 200 mg by mouth 2 (two) times daily.   . diphenhydramine-acetaminophen (TYLENOL PM) 25-500 MG TABS tablet Take 1 tablet by mouth at bedtime as needed (sleep).   . Multiple Vitamin (MULITIVITAMIN WITH MINERALS) TABS Take 1 tablet by mouth daily.  . sodium bicarbonate 650 MG tablet Take 1 tablet (650 mg total) by mouth 2 (two) times daily.  . tamsulosin (FLOMAX) 0.4 MG CAPS  capsule Take 0.4 mg by mouth daily.   Marland Kitchen omeprazole (PRILOSEC) 20 MG capsule 1 PO 30 MINS PRIOR TO BREAKFAST. (Patient not taking: Reported on 08/29/2018)   No facility-administered encounter medications on file as of 08/29/2018.     ALLERGIES:  No Known Allergies   PHYSICAL EXAM:  ECOG Performance status: 1   Vitals:   08/29/18 1117  BP: (!) 148/100  Pulse: 84  Resp: 18  Temp: 97.6 F (36.4 C)  SpO2: 100%   Filed Weights   08/29/18 1117  Weight: 218 lb (98.9 kg)    Physical Exam Constitutional:      Appearance: Normal appearance.  Cardiovascular:     Rate and Rhythm: Normal rate.     Heart sounds: Normal heart sounds.  Pulmonary:     Effort: Pulmonary effort is normal.     Breath sounds: Normal breath sounds.  Abdominal:     General: Bowel sounds are normal.     Palpations: Abdomen is soft. There is no mass.     Tenderness: There is no abdominal tenderness.  Musculoskeletal:        General: No swelling.  Lymphadenopathy:     Cervical: Cervical adenopathy present.  Skin:    General: Skin is warm.  Neurological:     General: No focal deficit present.     Mental Status: He is alert and oriented to person, place, and time.   Psychiatric:        Mood and Affect: Mood normal.        Behavior: Behavior normal.      LABORATORY DATA:  I have reviewed the labs as listed.  CBC    Component Value Date/Time   WBC 7.7 08/20/2018 0940   RBC 3.85 (L) 08/20/2018 0940   HGB 12.3 (L) 08/20/2018 0940   HCT 36.8 (L) 08/20/2018 0940   PLT 67 (L) 08/20/2018 0940   MCV 95.6 08/20/2018 0940   MCH 31.9 08/20/2018 0940   MCHC 33.4 08/20/2018 0940   RDW 13.1 08/20/2018 0940   LYMPHSABS 3.7 08/20/2018 0940   MONOABS 1.3 (H) 08/20/2018 0940   EOSABS 0.2 08/20/2018 0940   BASOSABS 0.0 08/20/2018 0940   CMP Latest Ref Rng & Units 08/20/2018 07/08/2018 06/06/2018  Glucose 70 - 99 mg/dL 131(H) 100(H) 108(H)  BUN 8 - 23 mg/dL 41(H) 39(H) 41(H)  Creatinine 0.61 - 1.24 mg/dL 2.99(H) 3.04(H) 2.70(H)  Sodium 135 - 145 mmol/L 142 141 141  Potassium 3.5 - 5.1 mmol/L 3.5 3.8 3.4(L)  Chloride 98 - 111 mmol/L 111 108 107  CO2 22 - 32 mmol/L 21(L) 26 26  Calcium 8.9 - 10.3 mg/dL 9.2 9.3 9.6  Total Protein 6.5 - 8.1 g/dL 6.6 6.4(L) 6.4(L)  Total Bilirubin 0.3 - 1.2 mg/dL 0.7 1.0 1.0  Alkaline Phos 38 - 126 U/L 46 35(L) 35(L)  AST 15 - 41 U/L _0 ALT 0 - 44 U/L _1 DIAGNOSTIC IMAGING:  I have independently reviewed the scans and discussed with the patient.   I have reviewed Venita Lick LPN's note and agree with the documentation.  I personally performed a face-to-face visit, made revisions and my assessment and plan is as follows.    ASSESSMENT & PLAN:   CLL (chronic lymphocytic leukemia) ([facility]) 1.  CLL, Rai stage IV: -Bone marrow biopsy on 08/10/2016 demonstrating marrow involvement with diffuse bulky adenopathy.  CBC showed progressive thrombocytopenia. -Imbruvica 420 mg from 08/19/2016 through May 2018, discontinued  secondary to subdural hematoma. - He was subsequently offered chlorambucil and obinutuzumab.  He refused obinutuzumab. -Chlorambucil 30 mg (15 tabs) every 2 weeks started towards the end of  June 2019. -He is tolerating chlorambucil very well. -PET/CT scan on 07/01/2018 shows interval decrease in size of cervical, axillary, retroperitoneal and iliac adenopathy and decrease in size of the splenomegaly.  However it showed uptake in the gastric region. - I have reviewed his blood work.  Hemoglobin is 12.2 and platelet count is 67. - I have recommended follow-up visit in 6 weeks with labs.  We plan to repeat another scan towards the end of June or early July and consider discontinuing chlorambucil if there is good response.  Venetoclax is also a good option.  However it has not been studied with creatinine clearance less than 30 mL/min.  2.  Barrett's esophagus: - EGD on 08/05/2018 showed Barrett's esophagus, gastritis and duodenitis.  EGD was done as there was increased uptake on the PET scan in the gastric region. - He follows up with Dr. Oneida Alar, GI.  3.  CKD: -Creatinine is stable around 2.9.   Total time spent is 25 minutes with more than 50% of the time spent face-to-face discussing PET scan results, current plan of care and future treatment options and coordination of care.    Orders placed this encounter:  No orders of the defined types were placed in this encounter.     Derek Jack, MD [place] [phone]

## 2018-08-29 NOTE — Assessment & Plan Note (Signed)
1.  CLL, Rai stage IV: -Bone marrow biopsy on 08/10/2016 demonstrating marrow involvement with diffuse bulky adenopathy.  CBC showed progressive thrombocytopenia. -Imbruvica 420 mg from 08/19/2016 through May 2018, discontinued secondary to subdural hematoma. - He was subsequently offered chlorambucil and obinutuzumab.  He refused obinutuzumab. -Chlorambucil 30 mg (15 tabs) every 2 weeks started towards the end of June 2019. -He is tolerating chlorambucil very well. -PET/CT scan on 07/01/2018 shows interval decrease in size of cervical, axillary, retroperitoneal and iliac adenopathy and decrease in size of the splenomegaly.  However it showed uptake in the gastric region. - I have reviewed his blood work.  Hemoglobin is 12.2 and platelet count is 67. - I have recommended follow-up visit in 6 weeks with labs.  We plan to repeat another scan towards the end of June or early July and consider discontinuing chlorambucil if there is good response.  Venetoclax is also a good option.  However it has not been studied with creatinine clearance less than 30 mL/min.  2.  Barrett's esophagus: - EGD on 08/05/2018 showed Barrett's esophagus, gastritis and duodenitis.  EGD was done as there was increased uptake on the PET scan in the gastric region. - He follows up with Dr. Oneida Alar, GI.  3.  CKD: -Creatinine is stable around 2.9.

## 2018-08-30 DIAGNOSIS — I10 Essential (primary) hypertension: Secondary | ICD-10-CM | POA: Diagnosis not present

## 2018-08-30 DIAGNOSIS — R51 Headache: Secondary | ICD-10-CM | POA: Diagnosis not present

## 2018-08-30 DIAGNOSIS — N184 Chronic kidney disease, stage 4 (severe): Secondary | ICD-10-CM | POA: Diagnosis not present

## 2018-09-09 DIAGNOSIS — Z0000 Encounter for general adult medical examination without abnormal findings: Secondary | ICD-10-CM | POA: Diagnosis not present

## 2018-09-11 DIAGNOSIS — R51 Headache: Secondary | ICD-10-CM | POA: Diagnosis not present

## 2018-09-11 DIAGNOSIS — N184 Chronic kidney disease, stage 4 (severe): Secondary | ICD-10-CM | POA: Diagnosis not present

## 2018-09-11 DIAGNOSIS — I10 Essential (primary) hypertension: Secondary | ICD-10-CM | POA: Diagnosis not present

## 2018-09-12 DIAGNOSIS — I129 Hypertensive chronic kidney disease with stage 1 through stage 4 chronic kidney disease, or unspecified chronic kidney disease: Secondary | ICD-10-CM | POA: Diagnosis not present

## 2018-09-12 DIAGNOSIS — N200 Calculus of kidney: Secondary | ICD-10-CM | POA: Diagnosis not present

## 2018-09-12 DIAGNOSIS — E785 Hyperlipidemia, unspecified: Secondary | ICD-10-CM | POA: Diagnosis not present

## 2018-09-12 DIAGNOSIS — D696 Thrombocytopenia, unspecified: Secondary | ICD-10-CM | POA: Diagnosis not present

## 2018-09-12 DIAGNOSIS — I4891 Unspecified atrial fibrillation: Secondary | ICD-10-CM | POA: Diagnosis not present

## 2018-09-12 DIAGNOSIS — I700 Atherosclerosis of aorta: Secondary | ICD-10-CM | POA: Diagnosis not present

## 2018-09-12 DIAGNOSIS — N179 Acute kidney failure, unspecified: Secondary | ICD-10-CM | POA: Diagnosis not present

## 2018-09-12 DIAGNOSIS — N184 Chronic kidney disease, stage 4 (severe): Secondary | ICD-10-CM | POA: Diagnosis not present

## 2018-09-12 DIAGNOSIS — C9110 Chronic lymphocytic leukemia of B-cell type not having achieved remission: Secondary | ICD-10-CM | POA: Diagnosis not present

## 2018-09-17 ENCOUNTER — Other Ambulatory Visit (HOSPITAL_COMMUNITY): Payer: Self-pay

## 2018-09-17 DIAGNOSIS — N183 Chronic kidney disease, stage 3 unspecified: Secondary | ICD-10-CM

## 2018-09-17 DIAGNOSIS — E790 Hyperuricemia without signs of inflammatory arthritis and tophaceous disease: Secondary | ICD-10-CM

## 2018-09-17 DIAGNOSIS — C9110 Chronic lymphocytic leukemia of B-cell type not having achieved remission: Secondary | ICD-10-CM

## 2018-09-18 ENCOUNTER — Ambulatory Visit: Payer: Medicare HMO | Admitting: Nurse Practitioner

## 2018-10-10 ENCOUNTER — Inpatient Hospital Stay (HOSPITAL_COMMUNITY): Payer: Medicare HMO | Attending: Hematology | Admitting: Hematology

## 2018-10-10 ENCOUNTER — Encounter (HOSPITAL_COMMUNITY): Payer: Self-pay | Admitting: Hematology

## 2018-10-10 ENCOUNTER — Other Ambulatory Visit: Payer: Self-pay

## 2018-10-10 ENCOUNTER — Inpatient Hospital Stay (HOSPITAL_COMMUNITY): Payer: Medicare HMO

## 2018-10-10 ENCOUNTER — Other Ambulatory Visit (HOSPITAL_COMMUNITY): Payer: Self-pay | Admitting: *Deleted

## 2018-10-10 VITALS — BP 125/84 | HR 76 | Temp 98.1°F | Resp 16 | Wt 221.4 lb

## 2018-10-10 DIAGNOSIS — C9110 Chronic lymphocytic leukemia of B-cell type not having achieved remission: Secondary | ICD-10-CM

## 2018-10-10 DIAGNOSIS — K2270 Barrett's esophagus without dysplasia: Secondary | ICD-10-CM | POA: Diagnosis not present

## 2018-10-10 DIAGNOSIS — Z800 Family history of malignant neoplasm of digestive organs: Secondary | ICD-10-CM | POA: Insufficient documentation

## 2018-10-10 DIAGNOSIS — Z806 Family history of leukemia: Secondary | ICD-10-CM

## 2018-10-10 DIAGNOSIS — I129 Hypertensive chronic kidney disease with stage 1 through stage 4 chronic kidney disease, or unspecified chronic kidney disease: Secondary | ICD-10-CM

## 2018-10-10 DIAGNOSIS — E790 Hyperuricemia without signs of inflammatory arthritis and tophaceous disease: Secondary | ICD-10-CM

## 2018-10-10 DIAGNOSIS — Z9221 Personal history of antineoplastic chemotherapy: Secondary | ICD-10-CM | POA: Diagnosis not present

## 2018-10-10 DIAGNOSIS — I429 Cardiomyopathy, unspecified: Secondary | ICD-10-CM | POA: Diagnosis not present

## 2018-10-10 DIAGNOSIS — N183 Chronic kidney disease, stage 3 unspecified: Secondary | ICD-10-CM

## 2018-10-10 DIAGNOSIS — Z87891 Personal history of nicotine dependence: Secondary | ICD-10-CM

## 2018-10-10 DIAGNOSIS — N189 Chronic kidney disease, unspecified: Secondary | ICD-10-CM | POA: Diagnosis not present

## 2018-10-10 DIAGNOSIS — Z79899 Other long term (current) drug therapy: Secondary | ICD-10-CM

## 2018-10-10 LAB — CBC WITH DIFFERENTIAL/PLATELET
Abs Immature Granulocytes: 0.02 10*3/uL (ref 0.00–0.07)
Basophils Absolute: 0 10*3/uL (ref 0.0–0.1)
Basophils Relative: 0 %
Eosinophils Absolute: 0.4 10*3/uL (ref 0.0–0.5)
Eosinophils Relative: 7 %
HCT: 36.2 % — ABNORMAL LOW (ref 39.0–52.0)
Hemoglobin: 12.2 g/dL — ABNORMAL LOW (ref 13.0–17.0)
Immature Granulocytes: 0 %
Lymphocytes Relative: 50 %
Lymphs Abs: 2.9 10*3/uL (ref 0.7–4.0)
MCH: 32.4 pg (ref 26.0–34.0)
MCHC: 33.7 g/dL (ref 30.0–36.0)
MCV: 96.3 fL (ref 80.0–100.0)
Monocytes Absolute: 0.6 10*3/uL (ref 0.1–1.0)
Monocytes Relative: 10 %
Neutro Abs: 2 10*3/uL (ref 1.7–7.7)
Neutrophils Relative %: 33 %
Platelets: 70 10*3/uL — ABNORMAL LOW (ref 150–400)
RBC: 3.76 MIL/uL — ABNORMAL LOW (ref 4.22–5.81)
RDW: 13.1 % (ref 11.5–15.5)
WBC: 5.9 10*3/uL (ref 4.0–10.5)
nRBC: 0 % (ref 0.0–0.2)

## 2018-10-10 LAB — COMPREHENSIVE METABOLIC PANEL
ALT: 14 U/L (ref 0–44)
AST: 19 U/L (ref 15–41)
Albumin: 4.2 g/dL (ref 3.5–5.0)
Alkaline Phosphatase: 48 U/L (ref 38–126)
Anion gap: 7 (ref 5–15)
BUN: 42 mg/dL — ABNORMAL HIGH (ref 8–23)
CO2: 23 mmol/L (ref 22–32)
Calcium: 9.2 mg/dL (ref 8.9–10.3)
Chloride: 110 mmol/L (ref 98–111)
Creatinine, Ser: 3.12 mg/dL — ABNORMAL HIGH (ref 0.61–1.24)
GFR calc Af Amer: 21 mL/min — ABNORMAL LOW (ref >60)
GFR calc non Af Amer: 18 mL/min — ABNORMAL LOW (ref >60)
Glucose, Bld: 105 mg/dL — ABNORMAL HIGH (ref 70–99)
Potassium: 3.7 mmol/L (ref 3.5–5.1)
Sodium: 140 mmol/L (ref 135–145)
Total Bilirubin: 0.9 mg/dL (ref 0.3–1.2)
Total Protein: 6.3 g/dL — ABNORMAL LOW (ref 6.5–8.1)

## 2018-10-10 MED ORDER — CHLORAMBUCIL 2 MG PO TABS: ORAL_TABLET | ORAL | 0 refills | Status: DC

## 2018-10-10 NOTE — Assessment & Plan Note (Signed)
1.  CLL, Rai stage IV: -Bone marrow biopsy on 08/10/2016 demonstrating marrow involvement with diffuse bulky adenopathy.  CBC showed progressive thrombocytopenia. -Imbruvica 420 mg from 08/19/2016 through May 2018, discontinued secondary to subdural hematoma. - He was subsequently offered chlorambucil and obinutuzumab.  He refused obinutuzumab. -Chlorambucil 30 mg (15 tabs) every 2 weeks started towards the end of June 2019. -He is tolerating chlorambucil very well. -PET/CT scan on 07/01/2018 shows interval decrease in size of cervical, axillary, retroperitoneal and iliac adenopathy and decrease in size of the splenomegaly.  However it showed uptake in the gastric region. - I have reviewed his blood work.  Hemoglobin is 12.2.  Platelet count is 70. -I have recommended 6-week follow-up visit.  I plan to repeat his PET CT scan.  We will consider discontinuing chlorambucil if there is a good response.  Venetoclax is also a good option however it has not been studied with creatinine clearance less than 30 mL/min.   2.  Barrett's esophagus: - EGD on 08/05/2018 showed Barrett's esophagus, gastritis and duodenitis.  EGD was done as there was increased uptake on the PET scan in the gastric region. - He follows up with Dr. Oneida Alar, GI.  3.  CKD: -Creatinine is stable around 2.8-3.0.

## 2018-10-10 NOTE — Patient Instructions (Addendum)
[facility] at [facility] Discharge Instructions  You were seen today by Dr. Katragadda. He went over your recent lab results. He will see you back in 4 weeks for labs and follow up.   Thank you for choosing [facility] at [facility] to provide your oncology and hematology care.  To afford each patient quality time with our provider, please arrive at least 15 minutes before your scheduled appointment time.   If you have a lab appointment with the Cancer Center please come in thru the  Main Entrance and check in at the main information desk  You need to re-schedule your appointment should you arrive 10 or more minutes late.  We strive to give you quality time with our providers, and arriving late affects you and other patients whose appointments are after yours.  Also, if you no show three or more times for appointments you may be dismissed from the clinic at the providers discretion.     Again, thank you for choosing [facility].  Our hope is that these requests will decrease the amount of time that you wait before being seen by our physicians.       _____________________________________________________________  Should you have questions after your visit to [facility], please contact our office at [phone] between the hours of 8:00 a.m. and 4:30 p.m.  Voicemails left after 4:00 p.m. will not be returned until the following business day.  For prescription refill requests, have your pharmacy contact our office and allow 72 hours.    Cancer Center Support Programs:   > Cancer Support Group  2nd Tuesday of the month 1pm-2pm, Journey Room    

## 2018-10-10 NOTE — Progress Notes (Signed)
[facility], [place] [address]   CLINIC:  Medical Oncology/Hematology  PCP:  [name], MD [place] [id number] [phone]   REASON FOR VISIT:  Follow-up for CLL (chronic lymphocytic leukemia)   BRIEF ONCOLOGIC HISTORY:    CLL (chronic lymphocytic leukemia) ([place])   03/09/2015 Initial Diagnosis    CLL (chronic lymphocytic leukemia) ([place])    08/03/2016 Imaging    CT neck- 1. Diffuse and bulky lymphadenopathy in keeping with history of CLL. Nodal enlargement has diffusely progressed since April 2016. 2. Chest and abdomen CT reported separately.    08/03/2016 Imaging    CT CAP- 1. Bulky lymphadenopathy in the chest, abdomen, and pelvis as described. 2. Splenomegaly. 3. Coronary artery and thoracoabdominal aortic atherosclerosis. 4. Prostatomegaly. 5. Several tiny lucent lesions in the bony anatomic pelvis, nonspecific, but attention on follow-up imaging recommended.    08/10/2016 Procedure    Bone marrow aspiration and biopsy by IR.    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 PERIPHERAL BLOOD: - CHRONIC LYMPHOCYTIC LEUKEMIA. - THROMBOCYTOPENIA.    08/11/2016 Pathology Results    Bone Marrow Flow Cytometry - CHRONIC LYMPHOCYTIC LEUKEMIA/SMALL LYMPHOCYTIC LYMPHOMA.    08/16/2016 Pathology Results    Normal cytogenetics.  Molecular cytogenetic analysis by [place] demonstrates a 13q-    08/19/2016 -  Chemotherapy    Imbruvica 420 mg daily       CANCER STAGING: Cancer Staging CLL (chronic lymphocytic leukemia) ([place]) Staging form: Chronic Lymphocytic Leukemia / Small Lymphocytic Lymphoma, AJCC 8th Edition - Clinical stage from 09/04/2016: Modified Rai Stage IV (Binet: Stage C, Modified Rai risk: High, Lymphocytosis: Present, Adenopathy: Present, Organomegaly: Present, Anemia: Absent,  Thrombocytopenia: Present) - Signed by Baird Cancer, PA-C on 09/04/2016    INTERVAL HISTORY:  Mr. Naumann 78 y.o. male returns for routine follow-up. He is here today alone. He states that he has felt well since his last visit. Denies any nausea, vomiting, or diarrhea. Denies any new pains. Had not noticed any recent bleeding such as epistaxis, hematuria or hematochezia. Denies recent chest pain on exertion, shortness of breath on minimal exertion, pre-syncopal episodes, or palpitations. Denies any numbness or tingling in hands or feet. Denies any recent fevers, infections, or recent hospitalizations. Patient reports appetite at 100% and energy level at 50%.    REVIEW OF SYSTEMS:  Review of Systems  All other systems reviewed and are negative.    PAST MEDICAL/SURGICAL HISTORY:  Past Medical History:  Diagnosis Date  . CKD (chronic kidney disease), stage IV ([facility])   . CLL (chronic lymphocytic leukemia) ([place])   . Hypertension   . NICM (nonischemic cardiomyopathy) ([place])    a. cath 2007, EF 35%, minimal CAD. b. EF normal 2018.  Marland Kitchen PAF (paroxysmal atrial fibrillation) ([place])   . Pinched nerve in neck   . SDH (subdural hematoma) ([place])   . Thrombocytopenia ([place])    Past Surgical History:  Procedure Laterality Date  . BIOPSY  08/05/2018   Procedure: BIOPSY;  Surgeon: Danie Binder, MD;  [facility];  Service: Endoscopy;;  gastric   . CATARACT EXTRACTION, BILATERAL    . COLONOSCOPY  07/10/2011   Procedure: COLONOSCOPY;  Surgeon: Dorothyann Peng, MD;  [facility];  Service: Endoscopy;  Laterality: N/A;  10:30 AM  . CRANIOTOMY N/A 10/14/2016   Procedure: CRANIOTOMY HEMATOMA EVACUATION  SUBDURAL;  Surgeon: Jovita Gamma, MD;  Location: [facility];  Service: Neurosurgery;  Laterality: N/A;  CRANIOTOMY HEMATOMA EVACUATION SUBDURAL  . ESOPHAGOGASTRODUODENOSCOPY N/A 08/05/2018   Procedure: ESOPHAGOGASTRODUODENOSCOPY (EGD);  Surgeon: Danie Binder, MD;  [facility];  Service: Endoscopy;  Laterality: N/A;  3:00pm  . THYROIDECTOMY, PARTIAL    . TONSILLECTOMY       SOCIAL HISTORY:  Social History   Socioeconomic History  . Marital status: Married    Spouse name: Not on file  . Number of children: Not on file  . Years of education: Not on file  . Highest education level: Not on file  Occupational History  . Not on file  Social Needs  . Financial resource strain: Not on file  . Food insecurity:    Worry: Not on file    Inability: Not on file  . Transportation needs:    Medical: Not on file    Non-medical: Not on file  Tobacco Use  . Smoking status: Former Smoker    Packs/day: 1.00    Years: 15.00    Pack years: 15.00    Last attempt to quit: 01/26/1971    Years since quitting: 47.7  . Smokeless tobacco: Never Used  Substance and Sexual Activity  . Alcohol use: No  . Drug use: No  . Sexual activity: Not on file  Lifestyle  . Physical activity:    Days per week: Not on file    Minutes per session: Not on file  . Stress: Not on file  Relationships  . Social connections:    Talks on phone: Not on file    Gets together: Not on file    Attends religious service: Not on file    Active member of club or organization: Not on file    Attends meetings of clubs or organizations: Not on file    Relationship status: Not on file  . Intimate partner violence:    Fear of current or ex partner: Not on file    Emotionally abused: Not on file    Physically abused: Not on file    Forced sexual activity: Not on file  Other Topics Concern  . Not on file  Social History Narrative  . Not on file    FAMILY HISTORY:  Family History  Problem Relation Age of Onset  . CAD Father 8  . Cancer Mother   . Alzheimer's disease Mother   . Colon cancer Neg Hx     CURRENT MEDICATIONS:  Outpatient Encounter Medications as of 10/10/2018  Medication Sig  . carvedilol (COREG) 6.25 MG tablet Take 1 tablet (6.25 mg total) by mouth 2 (two) times  daily.  . chlorambucil (LEUKERAN) 2 MG tablet Take 15 tablets (53m total) on Day 1 and Day 15. ( every other week) Give on an empty stomach 1 hour before or 2 hours after meals.  . Coenzyme Q10 (COQ-10) 200 MG CAPS Take 200 mg by mouth 2 (two) times daily.   . diphenhydramine-acetaminophen (TYLENOL PM) 25-500 MG TABS tablet Take 1 tablet by mouth at bedtime as needed (sleep).   . hydrALAZINE (APRESOLINE) 25 MG tablet Take 25 mg by mouth 2 (two) times daily.  . Multiple Vitamin (MULITIVITAMIN WITH MINERALS) TABS Take 1 tablet by mouth daily.  .Marland Kitchenomeprazole (PRILOSEC) 20 MG capsule 1 PO 30 MINS PRIOR TO BREAKFAST. (Patient taking differently: Take 20 mg by mouth daily. 1 PO 30 MINS PRIOR TO BREAKFAST.)  . sodium bicarbonate 650 MG  tablet Take 1 tablet (650 mg total) by mouth 2 (two) times daily.  . tamsulosin (FLOMAX) 0.4 MG CAPS capsule Take 0.4 mg by mouth daily.   . fluorometholone (FML) 0.1 % ophthalmic suspension INSTILL 1 DROP INTO EACH EYE THREE TIMES DAILY FOR 2 WEEKS   No facility-administered encounter medications on file as of 10/10/2018.     ALLERGIES:  No Known Allergies   PHYSICAL EXAM:  ECOG Performance status: 1  Vitals:   10/10/18 1002  BP: 125/84  Pulse: 76  Resp: 16  Temp: 98.1 F (36.7 C)  SpO2: 99%   Filed Weights   10/10/18 1002  Weight: 221 lb 6.4 oz (100.4 kg)    Physical Exam Vitals signs reviewed.  Constitutional:      Appearance: Normal appearance.  Cardiovascular:     Rate and Rhythm: Normal rate and regular rhythm.     Heart sounds: Normal heart sounds.  Pulmonary:     Effort: Pulmonary effort is normal.     Breath sounds: Normal breath sounds.  Abdominal:     General: There is no distension.     Palpations: Abdomen is soft. There is no mass.  Musculoskeletal:        General: No swelling.  Skin:    General: Skin is warm.  Neurological:     General: No focal deficit present.     Mental Status: He is alert and oriented to person, place,  and time.  Psychiatric:        Mood and Affect: Mood normal.        Behavior: Behavior normal.      LABORATORY DATA:  I have reviewed the labs as listed.  CBC    Component Value Date/Time   WBC 5.9 10/10/2018 0905   RBC 3.76 (L) 10/10/2018 0905   HGB 12.2 (L) 10/10/2018 0905   HCT 36.2 (L) 10/10/2018 0905   PLT 70 (L) 10/10/2018 0905   MCV 96.3 10/10/2018 0905   MCH 32.4 10/10/2018 0905   MCHC 33.7 10/10/2018 0905   RDW 13.1 10/10/2018 0905   LYMPHSABS 2.9 10/10/2018 0905   MONOABS 0.6 10/10/2018 0905   EOSABS 0.4 10/10/2018 0905   BASOSABS 0.0 10/10/2018 0905   CMP Latest Ref Rng & Units 10/10/2018 08/20/2018 07/08/2018  Glucose 70 - 99 mg/dL 105(H) 131(H) 100(H)  BUN 8 - 23 mg/dL 42(H) 41(H) 39(H)  Creatinine 0.61 - 1.24 mg/dL 3.12(H) 2.99(H) 3.04(H)  Sodium 135 - 145 mmol/L 140 142 141  Potassium 3.5 - 5.1 mmol/L 3.7 3.5 3.8  Chloride 98 - 111 mmol/L 110 111 108  CO2 22 - 32 mmol/L 23 21(L) 26  Calcium 8.9 - 10.3 mg/dL 9.2 9.2 9.3  Total Protein 6.5 - 8.1 g/dL 6.3(L) 6.6 6.4(L)  Total Bilirubin 0.3 - 1.2 mg/dL 0.9 0.7 1.0  Alkaline Phos 38 - 126 U/L 48 46 35(L)  AST 15 - 41 U/L _0 ALT 0 - 44 U/L _1 DIAGNOSTIC IMAGING:  I have independently reviewed the scans and discussed with the patient.   I have reviewed Venita Lick LPN's note and agree with the documentation.  I personally performed a face-to-face visit, made revisions and my assessment and plan is as follows.    ASSESSMENT & PLAN:   CLL (chronic lymphocytic leukemia) (H[facility]) 1.  CLL, Rai stage IV: -Bone marrow biopsy on 08/10/2016 demonstrating marrow involvement with diffuse bulky adenopathy.  CBC showed progressive thrombocytopenia. -Imbruvica 420 mg  from 08/19/2016 through May 2018, discontinued secondary to subdural hematoma. - He was subsequently offered chlorambucil and obinutuzumab.  He refused obinutuzumab. -Chlorambucil 30 mg (15 tabs) every 2 weeks started towards the end of  June 2019. -He is tolerating chlorambucil very well. -PET/CT scan on 07/01/2018 shows interval decrease in size of cervical, axillary, retroperitoneal and iliac adenopathy and decrease in size of the splenomegaly.  However it showed uptake in the gastric region. - I have reviewed his blood work.  Hemoglobin is 12.2.  Platelet count is 70. -I have recommended 6-week follow-up visit.  I plan to repeat his PET CT scan.  We will consider discontinuing chlorambucil if there is a good response.  Venetoclax is also a good option however it has not been studied with creatinine clearance less than 30 mL/min.   2.  Barrett's esophagus: - EGD on 08/05/2018 showed Barrett's esophagus, gastritis and duodenitis.  EGD was done as there was increased uptake on the PET scan in the gastric region. - He follows up with Dr. Oneida Alar, GI.  3.  CKD: -Creatinine is stable around 2.8-3.0.        Orders placed this encounter:  Orders Placed This Encounter  Procedures  . NM PET Image Restag (PS) Skull Base To Thigh      Derek Jack, MD [place] [phone]

## 2018-10-28 DIAGNOSIS — N184 Chronic kidney disease, stage 4 (severe): Secondary | ICD-10-CM | POA: Diagnosis not present

## 2018-10-28 DIAGNOSIS — D649 Anemia, unspecified: Secondary | ICD-10-CM | POA: Diagnosis not present

## 2018-10-28 DIAGNOSIS — R7301 Impaired fasting glucose: Secondary | ICD-10-CM | POA: Diagnosis not present

## 2018-10-28 DIAGNOSIS — I10 Essential (primary) hypertension: Secondary | ICD-10-CM | POA: Diagnosis not present

## 2018-10-28 DIAGNOSIS — R5383 Other fatigue: Secondary | ICD-10-CM | POA: Diagnosis not present

## 2018-10-28 DIAGNOSIS — Z8546 Personal history of malignant neoplasm of prostate: Secondary | ICD-10-CM | POA: Diagnosis not present

## 2018-10-28 DIAGNOSIS — E782 Mixed hyperlipidemia: Secondary | ICD-10-CM | POA: Diagnosis not present

## 2018-10-28 DIAGNOSIS — R944 Abnormal results of kidney function studies: Secondary | ICD-10-CM | POA: Diagnosis not present

## 2018-10-31 DIAGNOSIS — C9110 Chronic lymphocytic leukemia of B-cell type not having achieved remission: Secondary | ICD-10-CM | POA: Diagnosis not present

## 2018-10-31 DIAGNOSIS — R7301 Impaired fasting glucose: Secondary | ICD-10-CM | POA: Diagnosis not present

## 2018-10-31 DIAGNOSIS — K22711 Barrett's esophagus with high grade dysplasia: Secondary | ICD-10-CM | POA: Diagnosis not present

## 2018-10-31 DIAGNOSIS — E782 Mixed hyperlipidemia: Secondary | ICD-10-CM | POA: Diagnosis not present

## 2018-10-31 DIAGNOSIS — I4891 Unspecified atrial fibrillation: Secondary | ICD-10-CM | POA: Diagnosis not present

## 2018-10-31 DIAGNOSIS — D649 Anemia, unspecified: Secondary | ICD-10-CM | POA: Diagnosis not present

## 2018-10-31 DIAGNOSIS — D696 Thrombocytopenia, unspecified: Secondary | ICD-10-CM | POA: Diagnosis not present

## 2018-10-31 DIAGNOSIS — I10 Essential (primary) hypertension: Secondary | ICD-10-CM | POA: Diagnosis not present

## 2018-10-31 DIAGNOSIS — N400 Enlarged prostate without lower urinary tract symptoms: Secondary | ICD-10-CM | POA: Diagnosis not present

## 2018-10-31 DIAGNOSIS — N184 Chronic kidney disease, stage 4 (severe): Secondary | ICD-10-CM | POA: Diagnosis not present

## 2018-11-04 ENCOUNTER — Ambulatory Visit (HOSPITAL_COMMUNITY)
Admission: RE | Admit: 2018-11-04 | Discharge: 2018-11-04 | Disposition: A | Discharge status: Home / Self Care | Payer: Medicare HMO | Source: Ambulatory Visit | Attending: Hematology | Admitting: Hematology

## 2018-11-04 ENCOUNTER — Other Ambulatory Visit: Payer: Self-pay

## 2018-11-04 DIAGNOSIS — C9110 Chronic lymphocytic leukemia of B-cell type not having achieved remission: Secondary | ICD-10-CM | POA: Diagnosis not present

## 2018-11-04 MED ORDER — FLUDEOXYGLUCOSE F - 18 (FDG) INJECTION
12.9000 | Freq: Once | INTRAVENOUS | Status: AC | PRN
  Administered 2018-11-04: 12.9 via INTRAVENOUS

## 2018-11-06 ENCOUNTER — Other Ambulatory Visit (HOSPITAL_COMMUNITY): Payer: Medicare HMO

## 2018-11-06 ENCOUNTER — Other Ambulatory Visit (HOSPITAL_COMMUNITY): Payer: Self-pay | Admitting: *Deleted

## 2018-11-06 ENCOUNTER — Ambulatory Visit (HOSPITAL_COMMUNITY): Payer: Medicare HMO | Admitting: Hematology

## 2018-11-06 DIAGNOSIS — C9110 Chronic lymphocytic leukemia of B-cell type not having achieved remission: Secondary | ICD-10-CM

## 2018-11-06 DIAGNOSIS — N179 Acute kidney failure, unspecified: Secondary | ICD-10-CM

## 2018-11-07 ENCOUNTER — Other Ambulatory Visit: Payer: Self-pay

## 2018-11-07 ENCOUNTER — Inpatient Hospital Stay (HOSPITAL_BASED_OUTPATIENT_CLINIC_OR_DEPARTMENT_OTHER): Payer: Medicare HMO | Admitting: Hematology

## 2018-11-07 ENCOUNTER — Inpatient Hospital Stay (HOSPITAL_COMMUNITY): Payer: Medicare HMO | Attending: Hematology

## 2018-11-07 ENCOUNTER — Encounter (HOSPITAL_COMMUNITY): Payer: Self-pay | Admitting: Hematology

## 2018-11-07 ENCOUNTER — Other Ambulatory Visit (HOSPITAL_COMMUNITY): Payer: Self-pay | Admitting: *Deleted

## 2018-11-07 VITALS — BP 134/80 | HR 68 | Temp 98.2°F | Resp 16 | Wt 220.6 lb

## 2018-11-07 DIAGNOSIS — K2270 Barrett's esophagus without dysplasia: Secondary | ICD-10-CM | POA: Diagnosis not present

## 2018-11-07 DIAGNOSIS — N184 Chronic kidney disease, stage 4 (severe): Secondary | ICD-10-CM | POA: Insufficient documentation

## 2018-11-07 DIAGNOSIS — C9110 Chronic lymphocytic leukemia of B-cell type not having achieved remission: Secondary | ICD-10-CM

## 2018-11-07 DIAGNOSIS — I129 Hypertensive chronic kidney disease with stage 1 through stage 4 chronic kidney disease, or unspecified chronic kidney disease: Secondary | ICD-10-CM | POA: Insufficient documentation

## 2018-11-07 DIAGNOSIS — I480 Paroxysmal atrial fibrillation: Secondary | ICD-10-CM | POA: Insufficient documentation

## 2018-11-07 DIAGNOSIS — D696 Thrombocytopenia, unspecified: Secondary | ICD-10-CM | POA: Insufficient documentation

## 2018-11-07 DIAGNOSIS — N179 Acute kidney failure, unspecified: Secondary | ICD-10-CM

## 2018-11-07 DIAGNOSIS — R161 Splenomegaly, not elsewhere classified: Secondary | ICD-10-CM

## 2018-11-07 DIAGNOSIS — Z79899 Other long term (current) drug therapy: Secondary | ICD-10-CM | POA: Insufficient documentation

## 2018-11-07 DIAGNOSIS — I700 Atherosclerosis of aorta: Secondary | ICD-10-CM | POA: Insufficient documentation

## 2018-11-07 DIAGNOSIS — Z87891 Personal history of nicotine dependence: Secondary | ICD-10-CM | POA: Diagnosis not present

## 2018-11-07 LAB — COMPREHENSIVE METABOLIC PANEL
ALT: 13 U/L (ref 0–44)
AST: 16 U/L (ref 15–41)
Albumin: 4 g/dL (ref 3.5–5.0)
Alkaline Phosphatase: 50 U/L (ref 38–126)
Anion gap: 7 (ref 5–15)
BUN: 43 mg/dL — ABNORMAL HIGH (ref 8–23)
CO2: 21 mmol/L — ABNORMAL LOW (ref 22–32)
Calcium: 8.9 mg/dL (ref 8.9–10.3)
Chloride: 111 mmol/L (ref 98–111)
Creatinine, Ser: 3.29 mg/dL — ABNORMAL HIGH (ref 0.61–1.24)
GFR calc Af Amer: 20 mL/min — ABNORMAL LOW (ref >60)
GFR calc non Af Amer: 17 mL/min — ABNORMAL LOW (ref >60)
Glucose, Bld: 112 mg/dL — ABNORMAL HIGH (ref 70–99)
Potassium: 3.5 mmol/L (ref 3.5–5.1)
Sodium: 139 mmol/L (ref 135–145)
Total Bilirubin: 0.7 mg/dL (ref 0.3–1.2)
Total Protein: 6.1 g/dL — ABNORMAL LOW (ref 6.5–8.1)

## 2018-11-07 LAB — CBC WITH DIFFERENTIAL/PLATELET
Abs Immature Granulocytes: 0.03 10*3/uL (ref 0.00–0.07)
Basophils Absolute: 0 10*3/uL (ref 0.0–0.1)
Basophils Relative: 0 %
Eosinophils Absolute: 0.2 10*3/uL (ref 0.0–0.5)
Eosinophils Relative: 3 %
HCT: 34.7 % — ABNORMAL LOW (ref 39.0–52.0)
Hemoglobin: 11.7 g/dL — ABNORMAL LOW (ref 13.0–17.0)
Immature Granulocytes: 1 %
Lymphocytes Relative: 41 %
Lymphs Abs: 2.1 10*3/uL (ref 0.7–4.0)
MCH: 32.4 pg (ref 26.0–34.0)
MCHC: 33.7 g/dL (ref 30.0–36.0)
MCV: 96.1 fL (ref 80.0–100.0)
Monocytes Absolute: 0.5 10*3/uL (ref 0.1–1.0)
Monocytes Relative: 10 %
Neutro Abs: 2.3 10*3/uL (ref 1.7–7.7)
Neutrophils Relative %: 45 %
Platelets: 69 10*3/uL — ABNORMAL LOW (ref 150–400)
RBC: 3.61 MIL/uL — ABNORMAL LOW (ref 4.22–5.81)
RDW: 12.7 % (ref 11.5–15.5)
WBC: 5.1 10*3/uL (ref 4.0–10.5)
nRBC: 0 % (ref 0.0–0.2)

## 2018-11-07 LAB — LACTATE DEHYDROGENASE: LDH: 108 U/L (ref 98–192)

## 2018-11-07 MED ORDER — CHLORAMBUCIL 2 MG PO TABS: ORAL_TABLET | ORAL | 0 refills | Status: DC

## 2018-11-07 NOTE — Patient Instructions (Signed)
[facility] at [facility] Discharge Instructions  You were seen today by Dr. Katragadda. He went over your recent lab results. He will see you back in 2 months for labs and follow up.   Thank you for choosing [facility] at [facility] to provide your oncology and hematology care.  To afford each patient quality time with our provider, please arrive at least 15 minutes before your scheduled appointment time.   If you have a lab appointment with the Cancer Center please come in thru the  Main Entrance and check in at the main information desk  You need to re-schedule your appointment should you arrive 10 or more minutes late.  We strive to give you quality time with our providers, and arriving late affects you and other patients whose appointments are after yours.  Also, if you no show three or more times for appointments you may be dismissed from the clinic at the providers discretion.     Again, thank you for choosing [facility].  Our hope is that these requests will decrease the amount of time that you wait before being seen by our physicians.       _____________________________________________________________  Should you have questions after your visit to [facility], please contact our office at [phone] between the hours of 8:00 a.m. and 4:30 p.m.  Voicemails left after 4:00 p.m. will not be returned until the following business day.  For prescription refill requests, have your pharmacy contact our office and allow 72 hours.    Cancer Center Support Programs:   > Cancer Support Group  2nd Tuesday of the month 1pm-2pm, Journey Room    

## 2018-11-07 NOTE — Progress Notes (Signed)
Andrew [name], [name] [id number]   CLINIC:  Medical Oncology/Hematology  PCP:  [name], MD [place] [phone]   REASON FOR VISIT:  Follow-up for CLL (chronic lymphocytic leukemia)   BRIEF ONCOLOGIC HISTORY:  Oncology History  CLL (chronic lymphocytic leukemia) ([place])  03/09/2015 Initial Diagnosis   CLL (chronic lymphocytic leukemia) ([place])   08/03/2016 Imaging   CT neck- 1. Diffuse and bulky lymphadenopathy in keeping with history of CLL. Nodal enlargement has diffusely progressed since April 2016. 2. Chest and abdomen CT reported separately.   08/03/2016 Imaging   CT CAP- 1. Bulky lymphadenopathy in the chest, abdomen, and pelvis as described. 2. Splenomegaly. 3. Coronary artery and thoracoabdominal aortic atherosclerosis. 4. Prostatomegaly. 5. Several tiny lucent lesions in the bony anatomic pelvis, nonspecific, but attention on follow-up imaging recommended.   08/10/2016 Procedure   Bone marrow aspiration and biopsy by IR.   08/11/2016 Pathology Results   Bone Marrow, Aspirate,Biopsy, and Clot, left iliac crest - MARKED INVOLVEMENT BY CHRONIC LYMPHOCYTIC LEUKEMIA/SMALL LYMPHOCYTIC LYMPHOMA. PERIPHERAL BLOOD: - CHRONIC LYMPHOCYTIC LEUKEMIA. - THROMBOCYTOPENIA.   08/11/2016 Pathology Results   Bone Marrow Flow Cytometry - CHRONIC LYMPHOCYTIC LEUKEMIA/SMALL LYMPHOCYTIC LYMPHOMA.   08/16/2016 Pathology Results   Normal cytogenetics.  Molecular cytogenetic analysis by [place] demonstrates a 13q-   08/19/2016 -  Chemotherapy   Imbruvica 420 mg daily       CANCER STAGING: Cancer Staging CLL (chronic lymphocytic leukemia) ([place]) Staging form: Chronic Lymphocytic Leukemia / Small Lymphocytic Lymphoma, AJCC 8th Edition - Clinical stage from 09/04/2016: Modified Rai Stage IV (Binet: Stage C, Modified Rai risk: High, Lymphocytosis: Present, Adenopathy: Present, Organomegaly: Present, Anemia: Absent,  Thrombocytopenia: Present) - Signed by Baird Cancer, PA-C on 09/04/2016    INTERVAL HISTORY:  Andrew [name] 78 y.o. male seen for follow-up of CLL.  Appetite is 100%.  Energy levels are 50%.  Denies any fevers, night sweats or weight loss.  Denies any recurrent infections or hospitalizations.  Denies any bleeding per rectum or melena.  Denies nausea vomiting or diarrhea.  Taking chlorambucil every 2 weeks.  Pain is rated as 0.    REVIEW OF SYSTEMS:  Review of Systems  All other systems reviewed and are negative.    PAST MEDICAL/SURGICAL HISTORY:  Past Medical History:  Diagnosis Date  . CKD (chronic kidney disease), stage IV ([place])   . CLL (chronic lymphocytic leukemia) ([place] [employer])   . Hypertension   . NICM (nonischemic cardiomyopathy) ([place])    a. cath 2007, EF 35%, minimal CAD. b. EF normal 2018.  Marland Kitchen PAF (paroxysmal atrial fibrillation) ([place])   . Pinched nerve in neck   . SDH (subdural hematoma) ([place])   . Thrombocytopenia ([place])    Past Surgical History:  Procedure Laterality Date  . BIOPSY  08/05/2018   Procedure: BIOPSY;  Surgeon: Danie Binder, MD;  [facility];  Service: Endoscopy;;  gastric   . CATARACT EXTRACTION, BILATERAL    . COLONOSCOPY  07/10/2011   Procedure: COLONOSCOPY;  Surgeon: Dorothyann Peng, MD;  [facility];  Service: Endoscopy;  Laterality: N/A;  10:30 AM  . CRANIOTOMY N/A 10/14/2016   Procedure: CRANIOTOMY HEMATOMA EVACUATION SUBDURAL;  Surgeon: Jovita Gamma, MD;  Location: [facility];  Service: Neurosurgery;  Laterality: N/A;  CRANIOTOMY HEMATOMA EVACUATION SUBDURAL  . ESOPHAGOGASTRODUODENOSCOPY N/A 08/05/2018   Procedure: ESOPHAGOGASTRODUODENOSCOPY (EGD);  Surgeon: Danie Binder, MD;  [facility];  Service:  Endoscopy;  Laterality: N/A;  3:00pm  . THYROIDECTOMY, PARTIAL    . TONSILLECTOMY       SOCIAL HISTORY:  Social History   Socioeconomic History  . Marital status: Married    Spouse name: Not on file  . Number of  children: Not on file  . Years of education: Not on file  . Highest education level: Not on file  Occupational History  . Not on file  Social Needs  . Financial resource strain: Not on file  . Food insecurity    Worry: Not on file    Inability: Not on file  . Transportation needs    Medical: Not on file    Non-medical: Not on file  Tobacco Use  . Smoking status: Former Smoker    Packs/day: 1.00    Years: 15.00    Pack years: 15.00    Quit date: 01/26/1971    Years since quitting: 47.8  . Smokeless tobacco: Never Used  Substance and Sexual Activity  . Alcohol use: No  . Drug use: No  . Sexual activity: Not on file  Lifestyle  . Physical activity    Days per week: Not on file    Minutes per session: Not on file  . Stress: Not on file  Relationships  . Social Herbalist on phone: Not on file    Gets together: Not on file    Attends religious service: Not on file    Active member of club or organization: Not on file    Attends meetings of clubs or organizations: Not on file    Relationship status: Not on file  . Intimate partner violence    Fear of current or ex partner: Not on file    Emotionally abused: Not on file    Physically abused: Not on file    Forced sexual activity: Not on file  Other Topics Concern  . Not on file  Social History Narrative  . Not on file    FAMILY HISTORY:  Family History  Problem Relation Age of Onset  . CAD Father 6  . Cancer Mother   . Alzheimer's disease Mother   . Colon cancer Neg Hx     CURRENT MEDICATIONS:  Outpatient Encounter Medications as of 11/07/2018  Medication Sig  . carvedilol (COREG) 6.25 MG tablet Take 1 tablet (6.25 mg total) by mouth 2 (two) times daily.  . chlorambucil (LEUKERAN) 2 MG tablet Take 15 tablets (31m total) on Day 1 and Day 15. ( every other week) Give on an empty stomach 1 hour before or 2 hours after meals.  . Coenzyme Q10 (COQ-10) 200 MG CAPS Take 200 mg by mouth 2 (two) times daily.    . diphenhydramine-acetaminophen (TYLENOL PM) 25-500 MG TABS tablet Take 1 tablet by mouth at bedtime as needed (sleep).   . fluorometholone (FML) 0.1 % ophthalmic suspension INSTILL 1 DROP INTO EACH EYE THREE TIMES DAILY FOR 2 WEEKS  . hydrALAZINE (APRESOLINE) 25 MG tablet Take 25 mg by mouth 2 (two) times daily.  . Multiple Vitamin (MULITIVITAMIN WITH MINERALS) TABS Take 1 tablet by mouth daily.  .Marland Kitchenomeprazole (PRILOSEC) 20 MG capsule 1 PO 30 MINS PRIOR TO BREAKFAST. (Patient taking differently: Take 20 mg by mouth daily. 1 PO 30 MINS PRIOR TO BREAKFAST.)  . sodium bicarbonate 650 MG tablet Take 1 tablet (650 mg total) by mouth 2 (two) times daily.  . tamsulosin (FLOMAX) 0.4 MG CAPS capsule Take 0.4 mg by mouth  daily.    No facility-administered encounter medications on file as of 11/07/2018.     ALLERGIES:  No Known Allergies   PHYSICAL EXAM:  ECOG Performance status: 1  Vitals:   11/07/18 1500  BP: 134/80  Pulse: 68  Resp: 16  Temp: 98.2 F (36.8 C)  SpO2: 98%   Filed Weights   11/07/18 1500  Weight: 220 lb 9 oz (100 kg)    Physical Exam Vitals signs reviewed.  Constitutional:      Appearance: Normal appearance.  Cardiovascular:     Rate and Rhythm: Normal rate and regular rhythm.     Heart sounds: Normal heart sounds.  Pulmonary:     Effort: Pulmonary effort is normal.     Breath sounds: Normal breath sounds.  Abdominal:     General: There is no distension.     Palpations: Abdomen is soft. There is no mass.  Musculoskeletal:        General: No swelling.  Skin:    General: Skin is warm.  Neurological:     General: No focal deficit present.     Mental Status: He is alert and oriented to person, place, and time.  Psychiatric:        Mood and Affect: Mood normal.        Behavior: Behavior normal.      LABORATORY DATA:  I have reviewed the labs as listed.  CBC    Component Value Date/Time   WBC 5.1 11/07/2018 1500   RBC 3.61 (L) 11/07/2018 1500   HGB  11.7 (L) 11/07/2018 1500   HCT 34.7 (L) 11/07/2018 1500   PLT 69 (L) 11/07/2018 1500   MCV 96.1 11/07/2018 1500   MCH 32.4 11/07/2018 1500   MCHC 33.7 11/07/2018 1500   RDW 12.7 11/07/2018 1500   LYMPHSABS 2.1 11/07/2018 1500   MONOABS 0.5 11/07/2018 1500   EOSABS 0.2 11/07/2018 1500   BASOSABS 0.0 11/07/2018 1500   CMP Latest Ref Rng & Units 11/07/2018 10/10/2018 08/20/2018  Glucose 70 - 99 mg/dL 112(H) 105(H) 131(H)  BUN 8 - 23 mg/dL 43(H) 42(H) 41(H)  Creatinine 0.61 - 1.24 mg/dL 3.29(H) 3.12(H) 2.99(H)  Sodium 135 - 145 mmol/L 139 140 142  Potassium 3.5 - 5.1 mmol/L 3.5 3.7 3.5  Chloride 98 - 111 mmol/L 111 110 111  CO2 22 - 32 mmol/L 21(L) 23 21(L)  Calcium 8.9 - 10.3 mg/dL 8.9 9.2 9.2  Total Protein 6.5 - 8.1 g/dL 6.1(L) 6.3(L) 6.6  Total Bilirubin 0.3 - 1.2 mg/dL 0.7 0.9 0.7  Alkaline Phos 38 - 126 U/L 50 48 46  AST 15 - 41 U/L '16 19 17  ' ALT 0 - 44 U/L '13 14 12       ' DIAGNOSTIC IMAGING:  I have independently reviewed the scans and discussed with the patient.   I have reviewed Venita Lick LPN's note and agree with the documentation.  I personally performed a face-to-face visit, made revisions and my assessment and plan is as follows.    ASSESSMENT & PLAN:   CLL (chronic lymphocytic leukemia) (HCC) 1.  CLL, Rai stage IV: -Bone marrow biopsy on 08/10/2016 demonstrating marrow involvement with diffuse bulky adenopathy.  CBC showed progressive thrombocytopenia. -Imbruvica 420 mg from 08/19/2016 through May 2018, discontinued secondary to subdural hematoma. - He was subsequently offered chlorambucil and obinutuzumab.  He refused obinutuzumab. -Chlorambucil 30 mg (15 tabs) every 2 weeks started towards the end of June 2019. -He is tolerating chlorambucil very well. -PET/CT scan on  07/01/2018 shows interval decrease in size of cervical, axillary, retroperitoneal and iliac adenopathy and decrease in size of the splenomegaly.  However it showed uptake in the gastric region. -  CBC shows white count 5.1 with platelet count of 69. -We reviewed PET CT scan results dated 11/04/2018 which showed stable compared to previous scan.  No hypermetabolic findings.  Mildly enlarged bilateral neck, bilateral axillary, mediastinal and bilateral pelvic lymph nodes which are non-hypermetabolic and are stable in size.  Stable mild splenomegaly measuring 15.9 cm with no hypermetabolism. -I had a prolonged discussion with the patient about discontinuation of chlorambucil at this time.  I do not know how much chlorambucil is contributing to his disease control.  It is definitely causing cytopenias. - We have decided to do close follow-ups upon discontinuation of chlorambucil.  He will see [place] back in 2 months with repeat blood works and physical exam.  2.  Barrett's esophagus: - EGD on 08/05/2018 showed Barrett's esophagus, gastritis and duodenitis.  EGD was done as there was increased uptake on the PET scan in the gastric region. - He follows up with Dr. Oneida Alar, GI.  3.  CKD: -Creatinine is stable around 2.8-3.0.    Total time spent is 25 minutes with more than 50% of the time spent face-to-face discussing scan results, treatment discontinuation and coordination of care.    Orders placed this encounter:  Orders Placed This Encounter  Procedures  . CBC with Differential/Platelet  . Comprehensive metabolic panel  . Lactate dehydrogenase      Derek Jack, MD [place] [phone]

## 2018-11-07 NOTE — Assessment & Plan Note (Signed)
1.  CLL, Rai stage IV: -Bone marrow biopsy on 08/10/2016 demonstrating marrow involvement with diffuse bulky adenopathy.  CBC showed progressive thrombocytopenia. -Imbruvica 420 mg from 08/19/2016 through May 2018, discontinued secondary to subdural hematoma. - He was subsequently offered chlorambucil and obinutuzumab.  He refused obinutuzumab. -Chlorambucil 30 mg (15 tabs) every 2 weeks started towards the end of June 2019. -He is tolerating chlorambucil very well. -PET/CT scan on 07/01/2018 shows interval decrease in size of cervical, axillary, retroperitoneal and iliac adenopathy and decrease in size of the splenomegaly.  However it showed uptake in the gastric region. - CBC shows white count 5.1 with platelet count of 69. -We reviewed PET CT scan results dated 11/04/2018 which showed stable compared to previous scan.  No hypermetabolic findings.  Mildly enlarged bilateral neck, bilateral axillary, mediastinal and bilateral pelvic lymph nodes which are non-hypermetabolic and are stable in size.  Stable mild splenomegaly measuring 15.9 cm with no hypermetabolism. -I had a prolonged discussion with the patient about discontinuation of chlorambucil at this time.  I do not know how much chlorambucil is contributing to his disease control.  It is definitely causing cytopenias. - We have decided to do close follow-ups upon discontinuation of chlorambucil.  He will see [place] back in 2 months with repeat blood works and physical exam.  2.  Barrett's esophagus: - EGD on 08/05/2018 showed Barrett's esophagus, gastritis and duodenitis.  EGD was done as there was increased uptake on the PET scan in the gastric region. - He follows up with Dr. Oneida Alar, GI.  3.  CKD: -Creatinine is stable around 2.8-3.0.

## 2018-11-14 DIAGNOSIS — C9110 Chronic lymphocytic leukemia of B-cell type not having achieved remission: Secondary | ICD-10-CM | POA: Diagnosis not present

## 2018-11-14 DIAGNOSIS — N200 Calculus of kidney: Secondary | ICD-10-CM | POA: Diagnosis not present

## 2018-11-14 DIAGNOSIS — N184 Chronic kidney disease, stage 4 (severe): Secondary | ICD-10-CM | POA: Diagnosis not present

## 2018-11-14 DIAGNOSIS — E785 Hyperlipidemia, unspecified: Secondary | ICD-10-CM | POA: Diagnosis not present

## 2018-11-14 DIAGNOSIS — N179 Acute kidney failure, unspecified: Secondary | ICD-10-CM | POA: Diagnosis not present

## 2018-11-14 DIAGNOSIS — I4891 Unspecified atrial fibrillation: Secondary | ICD-10-CM | POA: Diagnosis not present

## 2018-11-14 DIAGNOSIS — D696 Thrombocytopenia, unspecified: Secondary | ICD-10-CM | POA: Diagnosis not present

## 2018-11-14 DIAGNOSIS — I700 Atherosclerosis of aorta: Secondary | ICD-10-CM | POA: Diagnosis not present

## 2018-11-14 DIAGNOSIS — I129 Hypertensive chronic kidney disease with stage 1 through stage 4 chronic kidney disease, or unspecified chronic kidney disease: Secondary | ICD-10-CM | POA: Diagnosis not present

## 2018-11-19 ENCOUNTER — Other Ambulatory Visit (HOSPITAL_COMMUNITY): Payer: Self-pay | Admitting: *Deleted

## 2018-11-21 ENCOUNTER — Ambulatory Visit: Payer: Medicare HMO | Admitting: Cardiology

## 2018-11-21 ENCOUNTER — Encounter: Payer: Self-pay | Admitting: Cardiology

## 2018-11-21 ENCOUNTER — Other Ambulatory Visit: Payer: Self-pay

## 2018-11-21 VITALS — BP 145/80 | HR 79 | Temp 97.9°F | Ht 74.0 in | Wt 221.0 lb

## 2018-11-21 DIAGNOSIS — I712 Thoracic aortic aneurysm, without rupture, unspecified: Secondary | ICD-10-CM

## 2018-11-21 DIAGNOSIS — I10 Essential (primary) hypertension: Secondary | ICD-10-CM | POA: Diagnosis not present

## 2018-11-21 DIAGNOSIS — I480 Paroxysmal atrial fibrillation: Secondary | ICD-10-CM | POA: Diagnosis not present

## 2018-11-21 NOTE — Patient Instructions (Signed)

## 2018-11-21 NOTE — Progress Notes (Signed)
Clinical Summary Andrew [name] is a 78 y.o.male seen today for follow up of the following medical problems.   1. PAF - remote DCCV 15 years ago - per neurosurgery not a candidate for at least 2 months for antiplatelets or anticoag CHA2DS2 VASc score is at least 3, however given his subdural hematoma, he has not been a candidate for anticoagulation    - no recent palpitations. Compliant with meds  2. 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 CLL - followed by oncology  4. Thrombocytopenia - followed by heme - most recent platelets around 70s   5. HTN 11/2017 norvasc, telmisartan, and coreg stopped. Pcp restarted low dose coreg since that time.   -some recent high bp's, pcp recently started hydaralzine. Limited options given his renal dysfunction - home bp's are around 120s/70s   6.History of cardiomyopathy [facility] -he reports being told several year ago that his LVEF was 35%, this was around the time of his first diagnosis of afib. - records are not available at this time - recent echo with normal LVEF 55-60%.    7. CKD - followed at [facility]  8. 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 CKD (chronic kidney disease), stage IV ([place])   . CLL (chronic lymphocytic leukemia) ([place])   . Hypertension   . NICM (nonischemic cardiomyopathy) ([place])    a. cath 2007, EF 35%, minimal CAD. b. EF normal 2018.  Marland Kitchen PAF (paroxysmal atrial fibrillation) ([place])   . Pinched nerve in neck   . SDH (subdural hematoma) ([place])   . Thrombocytopenia (H[facility])      No Known Allergies   Current Outpatient Medications  Medication Sig Dispense Refill  . carvedilol (COREG) 6.25 MG tablet Take 1 tablet (6.25 mg total) by mouth 2 (two)  times daily. 180 tablet 3  . chlorambucil (LEUKERAN) 2 MG tablet Take 15 tablets (30mg  total) on Day 1 and Day 15. ( every other week) Give on an empty stomach 1 hour before or 2 hours after meals. 30 tablet 0  . Coenzyme Q10 (COQ-10) 200 MG CAPS Take 200 mg by mouth 2 (two) times daily.     . diphenhydramine-acetaminophen (TYLENOL PM) 25-500 MG TABS tablet Take 1 tablet by mouth at bedtime as needed (sleep).     . fluorometholone (FML) 0.1 % ophthalmic suspension INSTILL 1 DROP INTO EACH EYE THREE TIMES DAILY FOR 2 WEEKS    . hydrALAZINE (APRESOLINE) 25 MG tablet Take 25 mg by mouth 2 (two) times daily.    . Multiple Vitamin (MULITIVITAMIN WITH MINERALS) TABS Take 1 tablet by mouth daily.    Marland Kitchen omeprazole (PRILOSEC) 20 MG capsule 1 PO 30 MINS PRIOR TO BREAKFAST. (Patient taking differently: Take 20 mg by mouth daily. 1 PO 30 MINS PRIOR TO BREAKFAST.) 90 capsule 3  . sodium bicarbonate 650 MG tablet Take 1 tablet (650 mg total) by mouth 2 (two) times daily. 60 tablet 0  . tamsulosin (FLOMAX) 0.4 MG CAPS capsule Take 0.4 mg by mouth daily.      No current facility-administered medications for this visit.      Past Surgical History:  Procedure Laterality Date  . BIOPSY  08/05/2018   Procedure: BIOPSY;  Surgeon: Danie Binder, MD;  [facility];  Service: Endoscopy;;  gastric   . CATARACT EXTRACTION, BILATERAL    . COLONOSCOPY  07/10/2011   Procedure: COLONOSCOPY;  Surgeon: Dorothyann Peng, MD;  [facility];  Service: Endoscopy;  Laterality: N/A;  10:30 AM  . CRANIOTOMY N/A 10/14/2016   Procedure: CRANIOTOMY HEMATOMA EVACUATION SUBDURAL;  Surgeon: Jovita Gamma, MD;  Location: [facility];  Service: Neurosurgery;  Laterality: N/A;  CRANIOTOMY HEMATOMA EVACUATION SUBDURAL  . ESOPHAGOGASTRODUODENOSCOPY N/A 08/05/2018   Procedure: ESOPHAGOGASTRODUODENOSCOPY (EGD);  Surgeon: Danie Binder, MD;  [facility];  Service: Endoscopy;  Laterality: N/A;  3:00pm  .  THYROIDECTOMY, PARTIAL    . TONSILLECTOMY       No Known Allergies    Family History  Problem Relation Age of Onset  . CAD Father 68  . Cancer Mother   . Alzheimer's disease Mother   . Colon cancer Neg Hx      Social History Andrew [name] reports that he quit smoking about 47 years ago. He has a 15.00 pack-year smoking history. He has never used smokeless tobacco. Andrew [name] reports no history of alcohol use.   Review of Systems CONSTITUTIONAL: No weight loss, fever, chills, weakness or fatigue.  HEENT: Eyes: No visual loss, blurred vision, double vision or yellow sclerae.No hearing loss, sneezing, congestion, runny nose or sore throat.  SKIN: No rash or itching.  CARDIOVASCULAR: per hpi RESPIRATORY: No shortness of breath, cough or sputum.  GASTROINTESTINAL: No anorexia, nausea, vomiting or diarrhea. No abdominal pain or blood.  GENITOURINARY: No burning on urination, no polyuria NEUROLOGICAL: No headache, dizziness, syncope, paralysis, ataxia, numbness or tingling in the extremities. No change in bowel or bladder control.  MUSCULOSKELETAL: No muscle, back pain, joint pain or stiffness.  LYMPHATICS: No enlarged nodes. No history of splenectomy.  PSYCHIATRIC: No history of depression or anxiety.  ENDOCRINOLOGIC: No reports of sweating, cold or heat intolerance. No polyuria or polydipsia.  Marland Kitchen   Physical Examination Today's Vitals   11/21/18 0823  BP: (!) 145/80  Pulse: 79  Temp: 97.9 F (36.6 C)  SpO2: 97%  Weight: 221 lb (100.2 kg)  Height: 6\' 2"  (1.88 m)   Body mass index is 28.37 kg/m.  Gen: resting comfortably, no acute distress HEENT: no scleral icterus, pupils equal round and reactive, no palptable cervical adenopathy,  CV: RRR, no m/r/g, no jvd Resp: Clear to auscultation bilaterally GI: abdomen is soft, non-tender, non-distended, normal bowel sounds, no hepatosplenomegaly MSK: extremities are warm, no edema.  Skin: warm, no rash Neuro:  no focal  deficits Psych: appropriate affect   Diagnostic Studies  09/2016 echo Study Conclusions  - Left ventricle: The cavity size was normal. There was mild concentric hypertrophy. Systolic function was normal. The estimated ejection fraction was in the range of 55% to 60%. Wall motion was normal; there were no regional wall motion abnormalities. - Aortic valve: There was trivial regurgitation. - Left atrium: The atrium was mildly dilated.   Assessment and Plan  1. PAF - no symptoms, continue coreg - not on anticoag due to chronic thrombocytopenia and prior SDH  2. HTN -home numbers at goal, continue current meds   3. Aortic aneurysm - mild, noted by recent PET scan - continue to monitor at this time, he is already receiving routine imaging for his cancer, follow those studies   F/u 6 months      [name], M.D.

## 2018-11-28 DIAGNOSIS — D0439 Carcinoma in situ of skin of other parts of face: Secondary | ICD-10-CM | POA: Diagnosis not present

## 2018-11-28 DIAGNOSIS — L570 Actinic keratosis: Secondary | ICD-10-CM | POA: Diagnosis not present

## 2018-11-28 DIAGNOSIS — X32XXXD Exposure to sunlight, subsequent encounter: Secondary | ICD-10-CM | POA: Diagnosis not present

## 2018-12-26 DIAGNOSIS — H5210 Myopia, unspecified eye: Secondary | ICD-10-CM | POA: Diagnosis not present

## 2019-01-02 DIAGNOSIS — L570 Actinic keratosis: Secondary | ICD-10-CM | POA: Diagnosis not present

## 2019-01-02 DIAGNOSIS — X32XXXD Exposure to sunlight, subsequent encounter: Secondary | ICD-10-CM | POA: Diagnosis not present

## 2019-01-02 DIAGNOSIS — Z85828 Personal history of other malignant neoplasm of skin: Secondary | ICD-10-CM | POA: Diagnosis not present

## 2019-01-02 DIAGNOSIS — Z08 Encounter for follow-up examination after completed treatment for malignant neoplasm: Secondary | ICD-10-CM | POA: Diagnosis not present

## 2019-01-09 ENCOUNTER — Inpatient Hospital Stay (HOSPITAL_COMMUNITY): Payer: Medicare HMO

## 2019-01-09 ENCOUNTER — Encounter (HOSPITAL_COMMUNITY): Payer: Self-pay | Admitting: Hematology

## 2019-01-09 ENCOUNTER — Other Ambulatory Visit: Payer: Self-pay

## 2019-01-09 ENCOUNTER — Inpatient Hospital Stay (HOSPITAL_COMMUNITY): Payer: Medicare HMO | Attending: Hematology | Admitting: Hematology

## 2019-01-09 VITALS — BP 127/69 | HR 60 | Temp 98.5°F | Resp 18 | Wt 219.6 lb

## 2019-01-09 DIAGNOSIS — N184 Chronic kidney disease, stage 4 (severe): Secondary | ICD-10-CM | POA: Insufficient documentation

## 2019-01-09 DIAGNOSIS — C9110 Chronic lymphocytic leukemia of B-cell type not having achieved remission: Secondary | ICD-10-CM

## 2019-01-09 DIAGNOSIS — I129 Hypertensive chronic kidney disease with stage 1 through stage 4 chronic kidney disease, or unspecified chronic kidney disease: Secondary | ICD-10-CM | POA: Diagnosis not present

## 2019-01-09 DIAGNOSIS — Z87891 Personal history of nicotine dependence: Secondary | ICD-10-CM | POA: Insufficient documentation

## 2019-01-09 DIAGNOSIS — Z79899 Other long term (current) drug therapy: Secondary | ICD-10-CM | POA: Diagnosis not present

## 2019-01-09 DIAGNOSIS — D696 Thrombocytopenia, unspecified: Secondary | ICD-10-CM | POA: Insufficient documentation

## 2019-01-09 DIAGNOSIS — K2270 Barrett's esophagus without dysplasia: Secondary | ICD-10-CM | POA: Insufficient documentation

## 2019-01-09 LAB — CBC WITH DIFFERENTIAL/PLATELET
Abs Immature Granulocytes: 0.03 10*3/uL (ref 0.00–0.07)
Basophils Absolute: 0 10*3/uL (ref 0.0–0.1)
Basophils Relative: 0 %
Eosinophils Absolute: 0.2 10*3/uL (ref 0.0–0.5)
Eosinophils Relative: 2 %
HCT: 38 % — ABNORMAL LOW (ref 39.0–52.0)
Hemoglobin: 12.8 g/dL — ABNORMAL LOW (ref 13.0–17.0)
Immature Granulocytes: 0 %
Lymphocytes Relative: 51 %
Lymphs Abs: 3.9 10*3/uL (ref 0.7–4.0)
MCH: 32.2 pg (ref 26.0–34.0)
MCHC: 33.7 g/dL (ref 30.0–36.0)
MCV: 95.5 fL (ref 80.0–100.0)
Monocytes Absolute: 0.6 10*3/uL (ref 0.1–1.0)
Monocytes Relative: 8 %
Neutro Abs: 3 10*3/uL (ref 1.7–7.7)
Neutrophils Relative %: 39 %
Platelets: 73 10*3/uL — ABNORMAL LOW (ref 150–400)
RBC: 3.98 MIL/uL — ABNORMAL LOW (ref 4.22–5.81)
RDW: 12.6 % (ref 11.5–15.5)
WBC: 7.7 10*3/uL (ref 4.0–10.5)
nRBC: 0 % (ref 0.0–0.2)

## 2019-01-09 LAB — COMPREHENSIVE METABOLIC PANEL
ALT: 15 U/L (ref 0–44)
AST: 20 U/L (ref 15–41)
Albumin: 4.4 g/dL (ref 3.5–5.0)
Alkaline Phosphatase: 54 U/L (ref 38–126)
Anion gap: 9 (ref 5–15)
BUN: 37 mg/dL — ABNORMAL HIGH (ref 8–23)
CO2: 20 mmol/L — ABNORMAL LOW (ref 22–32)
Calcium: 9.1 mg/dL (ref 8.9–10.3)
Chloride: 110 mmol/L (ref 98–111)
Creatinine, Ser: 2.82 mg/dL — ABNORMAL HIGH (ref 0.61–1.24)
GFR calc Af Amer: 24 mL/min — ABNORMAL LOW (ref >60)
GFR calc non Af Amer: 20 mL/min — ABNORMAL LOW (ref >60)
Glucose, Bld: 86 mg/dL (ref 70–99)
Potassium: 3.9 mmol/L (ref 3.5–5.1)
Sodium: 139 mmol/L (ref 135–145)
Total Bilirubin: 1.1 mg/dL (ref 0.3–1.2)
Total Protein: 6.6 g/dL (ref 6.5–8.1)

## 2019-01-09 LAB — LACTATE DEHYDROGENASE: LDH: 118 U/L (ref 98–192)

## 2019-01-09 NOTE — Patient Instructions (Signed)
[facility] at [facility] Discharge Instructions  You were seen today by Dr. Katragadda. He went over your recent lab results. He will see you back in 3 months for labs and follow up.   Thank you for choosing [facility] at [facility] to provide your oncology and hematology care.  To afford each patient quality time with our provider, please arrive at least 15 minutes before your scheduled appointment time.   If you have a lab appointment with the Cancer Center please come in thru the  Main Entrance and check in at the main information desk  You need to re-schedule your appointment should you arrive 10 or more minutes late.  We strive to give you quality time with our providers, and arriving late affects you and other patients whose appointments are after yours.  Also, if you no show three or more times for appointments you may be dismissed from the clinic at the providers discretion.     Again, thank you for choosing [facility].  Our hope is that these requests will decrease the amount of time that you wait before being seen by our physicians.       _____________________________________________________________  Should you have questions after your visit to [facility], please contact our office at [phone] between the hours of 8:00 a.m. and 4:30 p.m.  Voicemails left after 4:00 p.m. will not be returned until the following business day.  For prescription refill requests, have your pharmacy contact our office and allow 72 hours.    Cancer Center Support Programs:   > Cancer Support Group  2nd Tuesday of the month 1pm-2pm, Journey Room    

## 2019-01-09 NOTE — Progress Notes (Signed)
Andrew [name], [place] [address]   CLINIC:  Medical Oncology/Hematology  PCP:  [name], MD [place] [address] [phone]   REASON FOR VISIT:  Follow-up for CLL (chronic lymphocytic leukemia)   BRIEF ONCOLOGIC HISTORY:  Oncology History  CLL (chronic lymphocytic leukemia) ([place])  03/09/2015 Initial Diagnosis   CLL (chronic lymphocytic leukemia) ([place])   08/03/2016 Imaging   CT neck- 1. Diffuse and bulky lymphadenopathy in keeping with history of CLL. Nodal enlargement has diffusely progressed since April 2016. 2. Chest and abdomen CT reported separately.   08/03/2016 Imaging   CT CAP- 1. Bulky lymphadenopathy in the chest, abdomen, and pelvis as described. 2. Splenomegaly. 3. Coronary artery and thoracoabdominal aortic atherosclerosis. 4. Prostatomegaly. 5. Several tiny lucent lesions in the bony anatomic pelvis, nonspecific, but attention on follow-up imaging recommended.   08/10/2016 Procedure   Bone marrow aspiration and biopsy by IR.   08/11/2016 Pathology Results   Bone Marrow, Aspirate,Biopsy, and Clot, left iliac crest - MARKED INVOLVEMENT BY CHRONIC LYMPHOCYTIC LEUKEMIA/SMALL LYMPHOCYTIC LYMPHOMA. PERIPHERAL BLOOD: - CHRONIC LYMPHOCYTIC LEUKEMIA. - THROMBOCYTOPENIA.   08/11/2016 Pathology Results   Bone Marrow Flow Cytometry - CHRONIC LYMPHOCYTIC LEUKEMIA/SMALL LYMPHOCYTIC LYMPHOMA.   08/16/2016 Pathology Results   Normal cytogenetics.  Molecular cytogenetic analysis by [facility] demonstrates a 13q-   08/19/2016 -  Chemotherapy   Imbruvica 420 mg daily       CANCER STAGING: Cancer Staging CLL (chronic lymphocytic leukemia) ([place]) Staging form: Chronic Lymphocytic Leukemia / Small Lymphocytic Lymphoma, AJCC 8th Edition - Clinical stage from 09/04/2016: Modified Rai Stage IV ([place]: Stage C, Modified Rai risk: High, Lymphocytosis: Present, Adenopathy: Present, Organomegaly: Present, Anemia: Absent,  Thrombocytopenia: Present) - Signed by Baird Cancer, PA-C on 09/04/2016    INTERVAL HISTORY:  Andrew [name] 78 y.o. male seen for follow-up of CLL.  He stopped taking chlorambucil 2 months ago.  His energy levels have improved after stopping it.  Denied any fevers or infections in the last 2 months.  Energy levels are 100%.  Appetite is also 100%.  No pains reported.  No ER visits or hospitalizations.  Denies any nausea, vomiting, diarrhea or constipation.  No bleeding per rectum or melena.    REVIEW OF SYSTEMS:  Review of Systems  All other systems reviewed and are negative.    PAST MEDICAL/SURGICAL HISTORY:  Past Medical History:  Diagnosis Date  . CKD (chronic kidney disease), stage IV ([place])   . CLL (chronic lymphocytic leukemia) ([place])   . Hypertension   . NICM (nonischemic cardiomyopathy) ([place])    a. cath 2007, EF 35%, minimal CAD. b. EF normal 2018.  Marland Kitchen PAF (paroxysmal atrial fibrillation) ([place])   . Pinched nerve in neck   . SDH (subdural hematoma) ([place])   . Thrombocytopenia ([place])    Past Surgical History:  Procedure Laterality Date  . BIOPSY  08/05/2018   Procedure: BIOPSY;  Surgeon: Danie Binder, MD;  [facility];  Service: Endoscopy;;  gastric   . CATARACT EXTRACTION, BILATERAL    . COLONOSCOPY  07/10/2011   Procedure: COLONOSCOPY;  Surgeon: Dorothyann Peng, MD;  [facility];  Service: Endoscopy;  Laterality: N/A;  10:30 AM  . CRANIOTOMY N/A 10/14/2016   Procedure: CRANIOTOMY HEMATOMA EVACUATION SUBDURAL;  Surgeon: Jovita Gamma, MD;  Location: [facility];  Service: Neurosurgery;  Laterality: N/A;  CRANIOTOMY HEMATOMA EVACUATION SUBDURAL  . ESOPHAGOGASTRODUODENOSCOPY N/A 08/05/2018   Procedure: ESOPHAGOGASTRODUODENOSCOPY (EGD);  Surgeon: Danie Binder, MD;  [facility];  Service: Endoscopy;  Laterality: N/A;  3:00pm  . THYROIDECTOMY, PARTIAL    . TONSILLECTOMY       SOCIAL HISTORY:  Social History   Socioeconomic History  .  Marital status: Married    Spouse name: Not on file  . Number of children: Not on file  . Years of education: Not on file  . Highest education level: Not on file  Occupational History  . Not on file  Social Needs  . Financial resource strain: Not on file  . Food insecurity    Worry: Not on file    Inability: Not on file  . Transportation needs    Medical: Not on file    Non-medical: Not on file  Tobacco Use  . Smoking status: Former Smoker    Packs/day: 1.00    Years: 15.00    Pack years: 15.00    Quit date: 01/26/1971    Years since quitting: 47.9  . Smokeless tobacco: Never Used  Substance and Sexual Activity  . Alcohol use: No  . Drug use: No  . Sexual activity: Not on file  Lifestyle  . Physical activity    Days per week: Not on file    Minutes per session: Not on file  . Stress: Not on file  Relationships  . Social Herbalist on phone: Not on file    Gets together: Not on file    Attends religious service: Not on file    Active member of club or organization: Not on file    Attends meetings of clubs or organizations: Not on file    Relationship status: Not on file  . Intimate partner violence    Fear of current or ex partner: Not on file    Emotionally abused: Not on file    Physically abused: Not on file    Forced sexual activity: Not on file  Other Topics Concern  . Not on file  Social History Narrative  . Not on file    FAMILY HISTORY:  Family History  Problem Relation Age of Onset  . CAD Father 69  . Cancer Mother   . Alzheimer's disease Mother   . Colon cancer Neg Hx     CURRENT MEDICATIONS:  Outpatient Encounter Medications as of 01/09/2019  Medication Sig  . carvedilol (COREG) 6.25 MG tablet Take 1 tablet (6.25 mg total) by mouth 2 (two) times daily.  . Coenzyme Q10 (COQ-10) 200 MG CAPS Take 200 mg by mouth 2 (two) times daily.   . diphenhydramine-acetaminophen (TYLENOL PM) 25-500 MG TABS tablet Take 1 tablet by mouth at bedtime as  needed (sleep).   . hydrALAZINE (APRESOLINE) 25 MG tablet Take 25 mg by mouth 2 (two) times daily.  . Multiple Vitamin (MULITIVITAMIN WITH MINERALS) TABS Take 1 tablet by mouth daily.  Marland Kitchen omeprazole (PRILOSEC) 20 MG capsule 1 PO 30 MINS PRIOR TO BREAKFAST. (Patient taking differently: Take 20 mg by mouth daily. 1 PO 30 MINS PRIOR TO BREAKFAST.)  . sodium bicarbonate 650 MG tablet Take 1 tablet (650 mg total) by mouth 2 (two) times daily.  . tamsulosin (FLOMAX) 0.4 MG CAPS capsule Take 0.4 mg by mouth daily.    No facility-administered encounter medications on file as of 01/09/2019.     ALLERGIES:  No Known Allergies   PHYSICAL EXAM:  ECOG Performance status: 1  Vitals:   01/09/19 1400  BP: 127/69  Pulse: 60  Resp: 18  Temp: 98.5 F (36.9 C)  SpO2: 97%   Filed Weights   01/09/19 1400  Weight: 219 lb 9.6 oz (99.6 kg)    Physical Exam Vitals signs reviewed.  Constitutional:      Appearance: Normal appearance.  Cardiovascular:     Rate and Rhythm: Normal rate and regular rhythm.     Heart sounds: Normal heart sounds.  Pulmonary:     Effort: Pulmonary effort is normal.     Breath sounds: Normal breath sounds.  Abdominal:     General: There is no distension.     Palpations: Abdomen is soft. There is no mass.  Musculoskeletal:        General: No swelling.  Skin:    General: Skin is warm.  Neurological:     General: No focal deficit present.     Mental Status: He is alert and oriented to person, place, and time.  Psychiatric:        Mood and Affect: Mood normal.        Behavior: Behavior normal.      LABORATORY DATA:  I have reviewed the labs as listed.  CBC    Component Value Date/Time   WBC 7.7 01/09/2019 1331   RBC 3.98 (L) 01/09/2019 1331   HGB 12.8 (L) 01/09/2019 1331   HCT 38.0 (L) 01/09/2019 1331   PLT 73 (L) 01/09/2019 1331   MCV 95.5 01/09/2019 1331   MCH 32.2 01/09/2019 1331   MCHC 33.7 01/09/2019 1331   RDW 12.6 01/09/2019 1331   LYMPHSABS 3.9  01/09/2019 1331   MONOABS 0.6 01/09/2019 1331   EOSABS 0.2 01/09/2019 1331   BASOSABS 0.0 01/09/2019 1331   CMP Latest Ref Rng & Units 01/09/2019 11/07/2018 10/10/2018  Glucose 70 - 99 mg/dL 86 112(H) 105(H)  BUN 8 - 23 mg/dL 37(H) 43(H) 42(H)  Creatinine 0.61 - 1.24 mg/dL 2.82(H) 3.29(H) 3.12(H)  Sodium 135 - 145 mmol/L 139 139 140  Potassium 3.5 - 5.1 mmol/L 3.9 3.5 3.7  Chloride 98 - 111 mmol/L 110 111 110  CO2 22 - 32 mmol/L 20(L) 21(L) 23  Calcium 8.9 - 10.3 mg/dL 9.1 8.9 9.2  Total Protein 6.5 - 8.1 g/dL 6.6 6.1(L) 6.3(L)  Total Bilirubin 0.3 - 1.2 mg/dL 1.1 0.7 0.9  Alkaline Phos 38 - 126 U/L 54 50 48  AST 15 - 41 U/L 20 16 19   ALT 0 - 44 U/L 15 13 14        DIAGNOSTIC IMAGING:  I have independently reviewed the scans and discussed with the patient.   I have reviewed Venita Lick LPN's note and agree with the documentation.  I personally performed a face-to-face visit, made revisions and my assessment and plan is as follows.    ASSESSMENT & PLAN:   CLL (chronic lymphocytic leukemia) (HCC) 1.  CLL, Rai stage IV: - BM BX on 08/10/2016 demonstrating marrow involvement.  He also had diffuse bulky adenopathy.  CBC showed progressive thrombocytopenia. - Imbruvica 420 mg from 08/19/2016 through May 2018, discontinued secondary to subdural hematoma. - Subsequently offered chlorambucil and obinutuzumab.  He refused obinutuzumab. - Chlorambucil 30 mg every 2 weeks started towards the end of June 2019.  He tolerated it very well. - PET scan on 11/04/2018 showed stable compared to previous scan.  No hypermetabolic findings.  Mildly enlarged bilateral neck, bilateral axillary, mediastinal and bilateral pelvic lymph node which are non-hypermetabolic and are stable in size.  Stable mild splenomegaly measuring 15.9 cm with no hypermetabolism. -  Chlorambucil was discontinued on 11/07/2018.  He felt better after stopping chlorambucil in terms of energy. - We reviewed his CBC from today.  White  count is stable.  Hemoglobin improved by 1 g.  Platelet count is also more or less stable at 73. -Physical exam did not reveal any palpable adenopathy.  He denies any recurrent infections. - I will see him back in 3 months for follow-up with repeat blood counts.  Should he progress options include acalabrutinib or venetoclax.  2.  Barrett's esophagus: -EGD on 08/05/2018 showed Barrett's esophagus, gastritis and duodenitis.  EGD was done secondary to increased uptake on the PET scan.  3.  CKD: - Creatinine today improved to 2.82, from 3.2 previously.  Total time spent is 25 minutes with more than 50% of the time spent face-to-face discussing scan results, treatment discontinuation and coordination of care.    Orders placed this encounter:  Orders Placed This Encounter  Procedures  . CBC with Differential/Platelet  . Comprehensive metabolic panel  . Lactate dehydrogenase      Derek Jack, MD [place] [phone]

## 2019-01-09 NOTE — Assessment & Plan Note (Signed)
1.  CLL, Rai stage IV: - BM BX on 08/10/2016 demonstrating marrow involvement.  He also had diffuse bulky adenopathy.  CBC showed progressive thrombocytopenia. - Imbruvica 420 mg from 08/19/2016 through May 2018, discontinued secondary to subdural hematoma. - Subsequently offered chlorambucil and obinutuzumab.  He refused obinutuzumab. - Chlorambucil 30 mg every 2 weeks started towards the end of June 2019.  He tolerated it very well. - PET scan on 11/04/2018 showed stable compared to previous scan.  No hypermetabolic findings.  Mildly enlarged bilateral neck, bilateral axillary, mediastinal and bilateral pelvic lymph node which are non-hypermetabolic and are stable in size.  Stable mild splenomegaly measuring 15.9 cm with no hypermetabolism. -Chlorambucil was discontinued on 11/07/2018.  He felt better after stopping chlorambucil in terms of energy. - We reviewed his CBC from today.  White count is stable.  Hemoglobin improved by 1 g.  Platelet count is also more or less stable at 73. -Physical exam did not reveal any palpable adenopathy.  He denies any recurrent infections. - I will see him back in 3 months for follow-up with repeat blood counts.  Should he progress options include acalabrutinib or venetoclax.  2.  Barrett's esophagus: -EGD on 08/05/2018 showed Barrett's esophagus, gastritis and duodenitis.  EGD was done secondary to increased uptake on the PET scan.  3.  CKD: - Creatinine today improved to 2.82, from 3.2 previously.

## 2019-03-01 IMAGING — CT CT CERVICAL SPINE W/O CM
4 of 7 series · 13 of 33 positions shown, 14 images · non-contrast
Comparison: Head CT from 2 days ago.

CLINICAL DATA: Craniectomy 10/14/2016. Diffuse headache.
Right-sided neck pain since surgery.

EXAM:
CT HEAD WITHOUT CONTRAST
CT CERVICAL SPINE WITHOUT CONTRAST
TECHNIQUE: Multidetector CT imaging of the head and cervical spine was
performed following the standard protocol without intravenous
contrast. Multiplanar CT image reconstructions of the cervical spine
were also generated.

[Series 9: c_spine 2.0 st · axial · 0.40mm/px · z∈[-234,-106]mm · 4 of 108 slices shown, 5 images]
[im 22/108  soft-tissue]
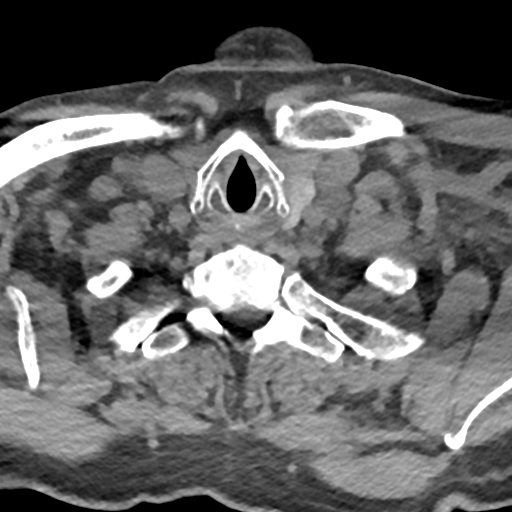
[im 22/108  bone]
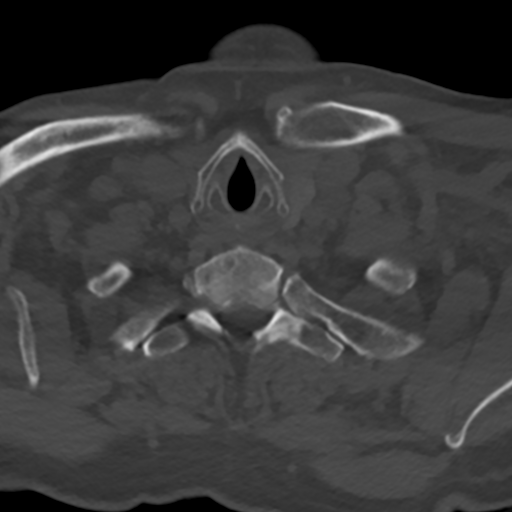
[im 43/108  bone]
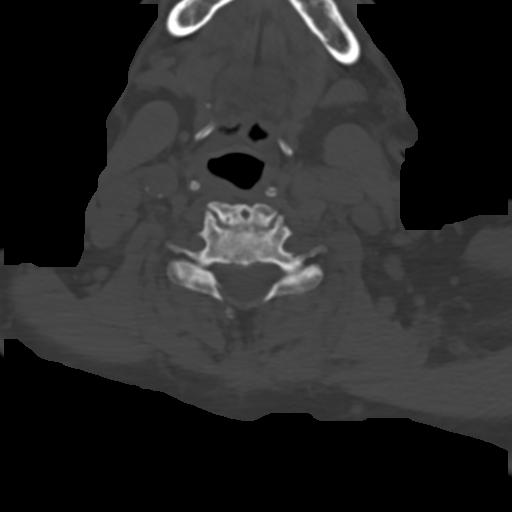
[im 65/108  bone]
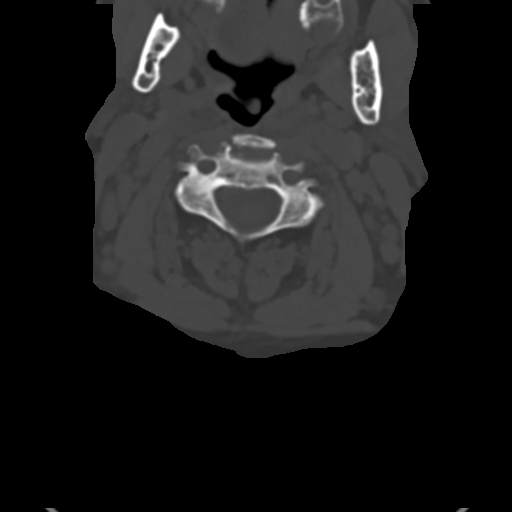
[im 86/108  bone]
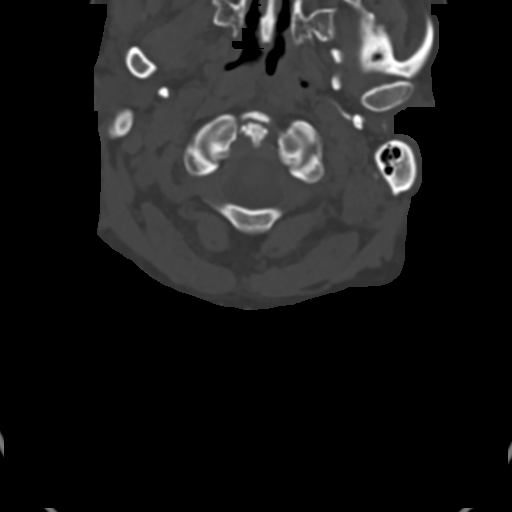

[Series 12: coronal bone · coronal · 0.28mm/px · 1 of 61 slices shown]
[im 31/61  bone]
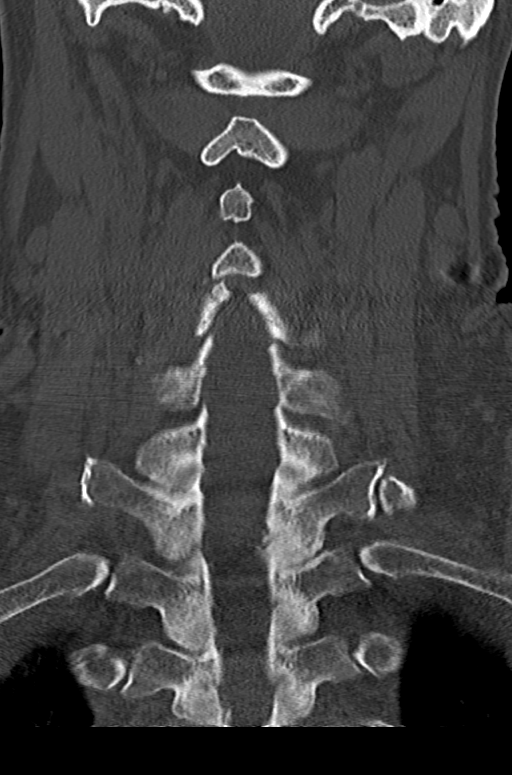

[Series 13: sagittal bone · sagittal · 0.26mm/px · 4 of 61 slices shown]
[im 13/61  bone]
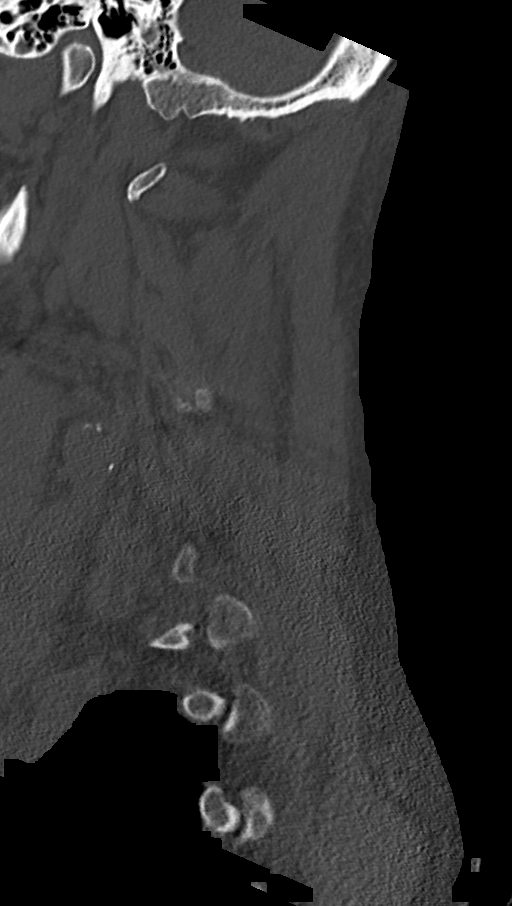
[im 25/61  bone]
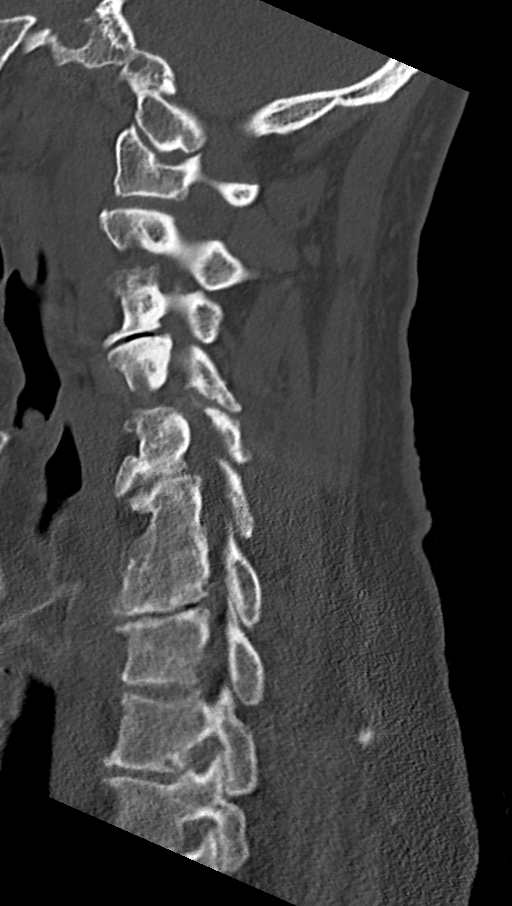
[im 37/61  bone]
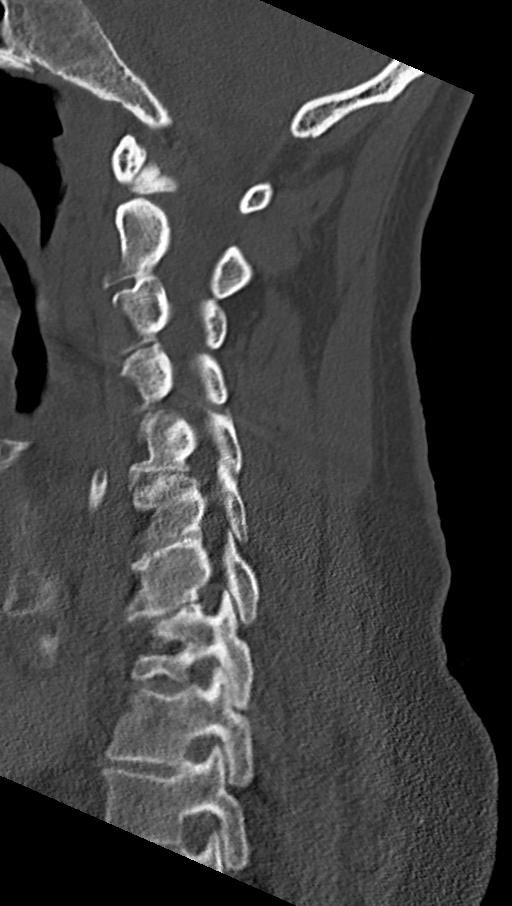
[im 49/61  bone]
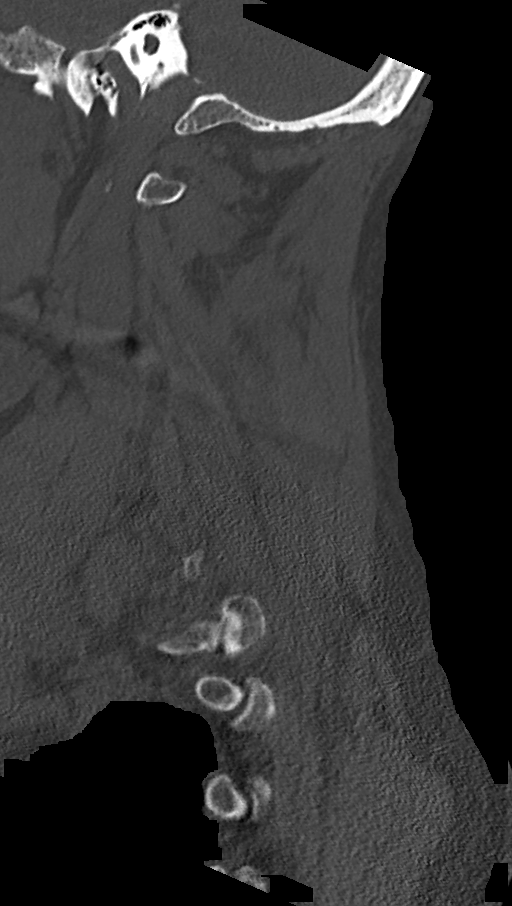

[Series 15: orthogonal axial st · axial · 0.21mm/px · z∈[-253,-142]mm · 4 of 104 slices shown]
[im 21/104  bone]
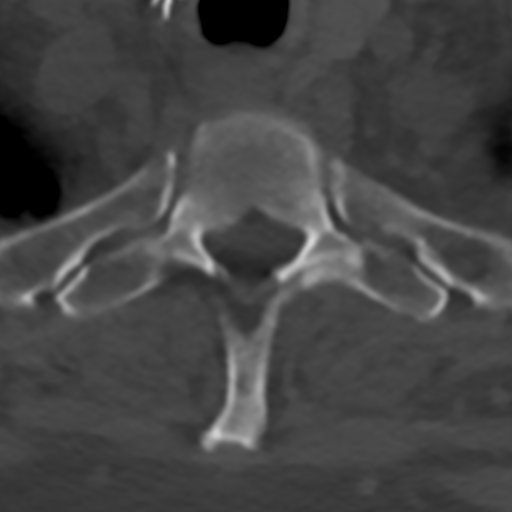
[im 42/104  bone]
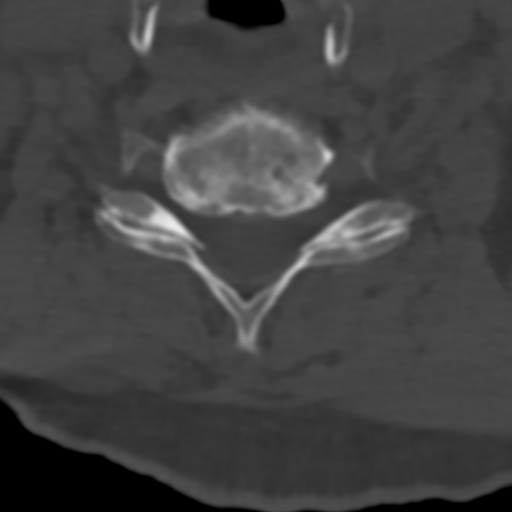
[im 62/104  bone]
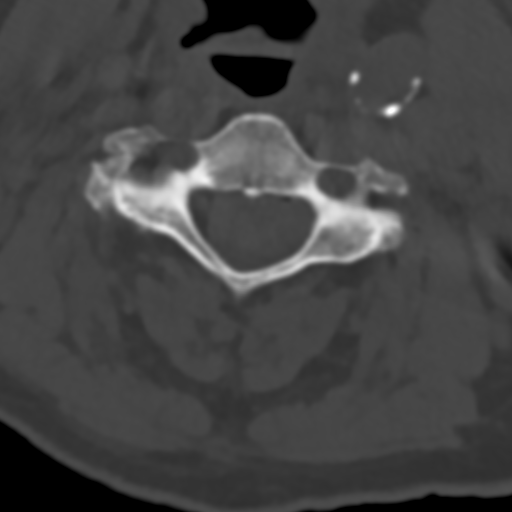
[im 83/104  bone]
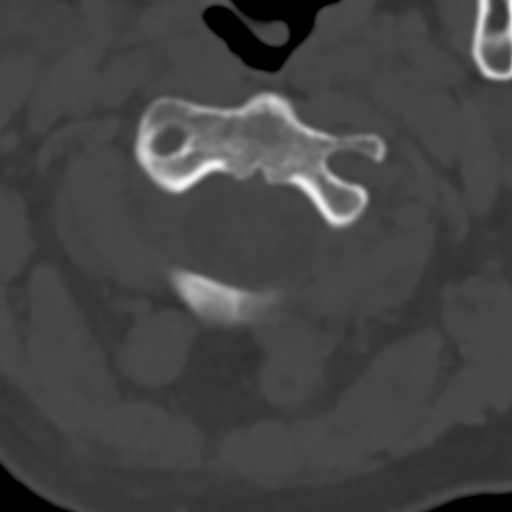

[13 of 33 positions shown; findings below may reference images not displayed]

FINDINGS: CT HEAD FINDINGS

Brain: Interval removal of subdural drain. Stable residual fluid and
gas when allowing for redistribution. Maximal thickness measures up
to 12 mm with mild right frontal mass effect. No new high-density to
suggest hemorrhage. Low, near fatty density material seen along the
inferior bone flap, also stable. No evidence of infarct or
hydrocephalus.

Vascular: Negative

Skull: Stable appearance of right-sided craniotomy flap.

Sinuses/Orbits: Negative

CT CERVICAL SPINE FINDINGS

Alignment: Facet mediated anterolisthesis at C4-5 measuring 2 mm.

Skull base and vertebrae: Negative for fracture, discitis, or
destructive process.

Soft tissues and spinal canal: No inflammation or canal hematoma
noted. Known adenopathy in this patient with history of CLL.

Disc levels: Disc degeneration with advanced narrowing and ridging
from C5-6 to C7-T1. There is multilevel facet arthropathy with with
particularly bulky spurring at C4-5. C2-3 facet ankylosis
bilaterally. No evidence of cord impingement. Diffuse right
foraminal narrowing, moderate to advanced at C4-5.

Upper chest: No acute finding
IMPRESSION: 1. Interval subdural drain removal with stable subdural collection.
No evidence of interval hemorrhage. Right frontal mass effect
remains mild. No midline shift.
2. No acute finding in the cervical spine.
3. Multilevel facet arthropathy and disc degeneration

## 2019-03-12 DIAGNOSIS — D649 Anemia, unspecified: Secondary | ICD-10-CM | POA: Diagnosis not present

## 2019-03-12 DIAGNOSIS — I10 Essential (primary) hypertension: Secondary | ICD-10-CM | POA: Diagnosis not present

## 2019-03-12 DIAGNOSIS — N184 Chronic kidney disease, stage 4 (severe): Secondary | ICD-10-CM | POA: Diagnosis not present

## 2019-03-12 DIAGNOSIS — N400 Enlarged prostate without lower urinary tract symptoms: Secondary | ICD-10-CM | POA: Diagnosis not present

## 2019-03-12 DIAGNOSIS — E782 Mixed hyperlipidemia: Secondary | ICD-10-CM | POA: Diagnosis not present

## 2019-03-12 DIAGNOSIS — C9110 Chronic lymphocytic leukemia of B-cell type not having achieved remission: Secondary | ICD-10-CM | POA: Diagnosis not present

## 2019-03-12 DIAGNOSIS — R7301 Impaired fasting glucose: Secondary | ICD-10-CM | POA: Diagnosis not present

## 2019-03-12 DIAGNOSIS — K22711 Barrett's esophagus with high grade dysplasia: Secondary | ICD-10-CM | POA: Diagnosis not present

## 2019-03-12 DIAGNOSIS — I4891 Unspecified atrial fibrillation: Secondary | ICD-10-CM | POA: Diagnosis not present

## 2019-03-12 DIAGNOSIS — D696 Thrombocytopenia, unspecified: Secondary | ICD-10-CM | POA: Diagnosis not present

## 2019-03-13 ENCOUNTER — Other Ambulatory Visit: Payer: Self-pay | Admitting: Gastroenterology

## 2019-03-13 ENCOUNTER — Telehealth: Payer: Self-pay

## 2019-03-13 DIAGNOSIS — I129 Hypertensive chronic kidney disease with stage 1 through stage 4 chronic kidney disease, or unspecified chronic kidney disease: Secondary | ICD-10-CM | POA: Diagnosis not present

## 2019-03-13 DIAGNOSIS — D696 Thrombocytopenia, unspecified: Secondary | ICD-10-CM | POA: Diagnosis not present

## 2019-03-13 DIAGNOSIS — C9110 Chronic lymphocytic leukemia of B-cell type not having achieved remission: Secondary | ICD-10-CM | POA: Diagnosis not present

## 2019-03-13 DIAGNOSIS — N200 Calculus of kidney: Secondary | ICD-10-CM | POA: Diagnosis not present

## 2019-03-13 DIAGNOSIS — N12 Tubulo-interstitial nephritis, not specified as acute or chronic: Secondary | ICD-10-CM | POA: Diagnosis not present

## 2019-03-13 DIAGNOSIS — N179 Acute kidney failure, unspecified: Secondary | ICD-10-CM | POA: Diagnosis not present

## 2019-03-13 DIAGNOSIS — N184 Chronic kidney disease, stage 4 (severe): Secondary | ICD-10-CM | POA: Diagnosis not present

## 2019-03-13 MED ORDER — OMEPRAZOLE 20 MG PO CPDR: DELAYED_RELEASE_CAPSULE | ORAL | 3 refills | Status: DC

## 2019-03-13 NOTE — Telephone Encounter (Signed)
Amy at Dr. Juel Burrow office called, pt switched pharmacy to [facility]. Requested for Omeprazole rx to be sent to [facility].

## 2019-03-13 NOTE — Telephone Encounter (Signed)
Rx Sent  

## 2019-03-14 NOTE — Telephone Encounter (Signed)
Tried to call pt, no answer, LMOVM to inform pt.

## 2019-04-07 ENCOUNTER — Inpatient Hospital Stay (HOSPITAL_COMMUNITY): Payer: Medicare HMO | Attending: Hematology

## 2019-04-07 ENCOUNTER — Other Ambulatory Visit: Payer: Self-pay

## 2019-04-07 DIAGNOSIS — N184 Chronic kidney disease, stage 4 (severe): Secondary | ICD-10-CM | POA: Diagnosis not present

## 2019-04-07 DIAGNOSIS — Z9221 Personal history of antineoplastic chemotherapy: Secondary | ICD-10-CM | POA: Diagnosis not present

## 2019-04-07 DIAGNOSIS — K2270 Barrett's esophagus without dysplasia: Secondary | ICD-10-CM | POA: Insufficient documentation

## 2019-04-07 DIAGNOSIS — Z79899 Other long term (current) drug therapy: Secondary | ICD-10-CM | POA: Insufficient documentation

## 2019-04-07 DIAGNOSIS — C9110 Chronic lymphocytic leukemia of B-cell type not having achieved remission: Secondary | ICD-10-CM | POA: Insufficient documentation

## 2019-04-07 DIAGNOSIS — I10 Essential (primary) hypertension: Secondary | ICD-10-CM | POA: Diagnosis not present

## 2019-04-07 DIAGNOSIS — Z87891 Personal history of nicotine dependence: Secondary | ICD-10-CM | POA: Insufficient documentation

## 2019-04-07 DIAGNOSIS — Z23 Encounter for immunization: Secondary | ICD-10-CM | POA: Insufficient documentation

## 2019-04-07 LAB — COMPREHENSIVE METABOLIC PANEL
ALT: 14 U/L (ref 0–44)
AST: 19 U/L (ref 15–41)
Albumin: 4.2 g/dL (ref 3.5–5.0)
Alkaline Phosphatase: 51 U/L (ref 38–126)
Anion gap: 7 (ref 5–15)
BUN: 47 mg/dL — ABNORMAL HIGH (ref 8–23)
CO2: 21 mmol/L — ABNORMAL LOW (ref 22–32)
Calcium: 9.1 mg/dL (ref 8.9–10.3)
Chloride: 109 mmol/L (ref 98–111)
Creatinine, Ser: 2.98 mg/dL — ABNORMAL HIGH (ref 0.61–1.24)
GFR calc Af Amer: 22 mL/min — ABNORMAL LOW (ref >60)
GFR calc non Af Amer: 19 mL/min — ABNORMAL LOW (ref >60)
Glucose, Bld: 117 mg/dL — ABNORMAL HIGH (ref 70–99)
Potassium: 4 mmol/L (ref 3.5–5.1)
Sodium: 137 mmol/L (ref 135–145)
Total Bilirubin: 0.8 mg/dL (ref 0.3–1.2)
Total Protein: 6.4 g/dL — ABNORMAL LOW (ref 6.5–8.1)

## 2019-04-07 LAB — CBC WITH DIFFERENTIAL/PLATELET
Abs Immature Granulocytes: 0.02 10*3/uL (ref 0.00–0.07)
Basophils Absolute: 0 10*3/uL (ref 0.0–0.1)
Basophils Relative: 0 %
Eosinophils Absolute: 0.2 10*3/uL (ref 0.0–0.5)
Eosinophils Relative: 2 %
HCT: 39 % (ref 39.0–52.0)
Hemoglobin: 13 g/dL (ref 13.0–17.0)
Immature Granulocytes: 0 %
Lymphocytes Relative: 63 %
Lymphs Abs: 6.7 10*3/uL — ABNORMAL HIGH (ref 0.7–4.0)
MCH: 32 pg (ref 26.0–34.0)
MCHC: 33.3 g/dL (ref 30.0–36.0)
MCV: 96.1 fL (ref 80.0–100.0)
Monocytes Absolute: 0.9 10*3/uL (ref 0.1–1.0)
Monocytes Relative: 9 %
Neutro Abs: 2.7 10*3/uL (ref 1.7–7.7)
Neutrophils Relative %: 26 %
Platelets: 68 10*3/uL — ABNORMAL LOW (ref 150–400)
RBC: 4.06 MIL/uL — ABNORMAL LOW (ref 4.22–5.81)
RDW: 12.5 % (ref 11.5–15.5)
WBC: 10.5 10*3/uL (ref 4.0–10.5)
nRBC: 0 % (ref 0.0–0.2)

## 2019-04-07 LAB — LACTATE DEHYDROGENASE: LDH: 115 U/L (ref 98–192)

## 2019-04-14 ENCOUNTER — Encounter (HOSPITAL_COMMUNITY): Payer: Self-pay | Admitting: Hematology

## 2019-04-14 ENCOUNTER — Inpatient Hospital Stay (HOSPITAL_COMMUNITY): Payer: Medicare HMO | Admitting: Hematology

## 2019-04-14 ENCOUNTER — Other Ambulatory Visit: Payer: Self-pay

## 2019-04-14 VITALS — BP 109/80 | HR 79 | Temp 97.1°F | Resp 16

## 2019-04-14 DIAGNOSIS — C9110 Chronic lymphocytic leukemia of B-cell type not having achieved remission: Secondary | ICD-10-CM

## 2019-04-14 DIAGNOSIS — Z79899 Other long term (current) drug therapy: Secondary | ICD-10-CM | POA: Diagnosis not present

## 2019-04-14 DIAGNOSIS — Z23 Encounter for immunization: Secondary | ICD-10-CM | POA: Diagnosis not present

## 2019-04-14 DIAGNOSIS — N184 Chronic kidney disease, stage 4 (severe): Secondary | ICD-10-CM | POA: Diagnosis not present

## 2019-04-14 DIAGNOSIS — Z87891 Personal history of nicotine dependence: Secondary | ICD-10-CM | POA: Diagnosis not present

## 2019-04-14 DIAGNOSIS — Z9221 Personal history of antineoplastic chemotherapy: Secondary | ICD-10-CM | POA: Diagnosis not present

## 2019-04-14 DIAGNOSIS — K2270 Barrett's esophagus without dysplasia: Secondary | ICD-10-CM | POA: Diagnosis not present

## 2019-04-14 DIAGNOSIS — I10 Essential (primary) hypertension: Secondary | ICD-10-CM | POA: Diagnosis not present

## 2019-04-14 MED ORDER — INFLUENZA VAC A&B SA ADJ QUAD 0.5 ML IM PRSY
0.5000 mL | PREFILLED_SYRINGE | Freq: Once | INTRAMUSCULAR | Status: AC
  Administered 2019-04-14: 0.5 mL via INTRAMUSCULAR
  Filled 2019-04-14: qty 0.5

## 2019-04-14 NOTE — Progress Notes (Signed)
[address], [place] [address]   CLINIC:  Medical Oncology/Hematology  PCP:  [name], MD [place] [address] [phone]   REASON FOR VISIT:  Follow-up for CLL (chronic lymphocytic leukemia)   BRIEF ONCOLOGIC HISTORY:  Oncology History  CLL (chronic lymphocytic leukemia) ([place])  03/09/2015 Initial Diagnosis   CLL (chronic lymphocytic leukemia) ([place])   08/03/2016 Imaging   CT neck- 1. Diffuse and bulky lymphadenopathy in keeping with history of CLL. Nodal enlargement has diffusely progressed since April 2016. 2. Chest and abdomen CT reported separately.   08/03/2016 Imaging   CT CAP- 1. Bulky lymphadenopathy in the chest, abdomen, and pelvis as described. 2. Splenomegaly. 3. Coronary artery and thoracoabdominal aortic atherosclerosis. 4. Prostatomegaly. 5. Several tiny lucent lesions in the bony anatomic pelvis, nonspecific, but attention on follow-up imaging recommended.   08/10/2016 Procedure   Bone marrow aspiration and biopsy by IR.   08/11/2016 Pathology Results   Bone Marrow, Aspirate,Biopsy, and Clot, left iliac crest - MARKED INVOLVEMENT BY CHRONIC LYMPHOCYTIC LEUKEMIA/SMALL LYMPHOCYTIC LYMPHOMA. PERIPHERAL BLOOD: - CHRONIC LYMPHOCYTIC LEUKEMIA. - THROMBOCYTOPENIA.   08/11/2016 Pathology Results   Bone Marrow Flow Cytometry - CHRONIC LYMPHOCYTIC LEUKEMIA/SMALL LYMPHOCYTIC LYMPHOMA.   08/16/2016 Pathology Results   Normal cytogenetics.  Molecular cytogenetic analysis by [place] demonstrates a 13q-   08/19/2016 -  Chemotherapy   Imbruvica 420 mg daily       CANCER STAGING: Cancer Staging CLL (chronic lymphocytic leukemia) ([place]) Staging form: Chronic Lymphocytic Leukemia / Small Lymphocytic Lymphoma, AJCC 8th Edition - Clinical stage from 09/04/2016: Modified Rai Stage IV (Binet: Stage C, Modified Rai risk: High, Lymphocytosis: Present, Adenopathy: Present, Organomegaly: Present, Anemia: Absent,  Thrombocytopenia: Present) - Signed by Baird Cancer, PA-C on 09/04/2016    INTERVAL HISTORY:  Mr. Miceli 78 y.o. male seen for follow-up of CLL.  Chlorambucil was discontinued secondary to severe tiredness.  Denies any fevers, night sweats or weight loss in the last 3 months.  Appetite and energy levels are 100%.  Tingling in the toes has been stable.  No new onset pains.  No recent infections or hospitalizations.  Denies nausea, vomiting, diarrhea or constipation.  No bleeding per rectum or melena.  No painful lymphadenopathy reported.    REVIEW OF SYSTEMS:  Review of Systems  Neurological: Positive for numbness.  All other systems reviewed and are negative.    PAST MEDICAL/SURGICAL HISTORY:  Past Medical History:  Diagnosis Date  . CKD (chronic kidney disease), stage IV ([place])   . CLL (chronic lymphocytic leukemia) ([place])   . Hypertension   . NICM (nonischemic cardiomyopathy) ([place])    a. cath 2007, EF 35%, minimal CAD. b. EF normal 2018.  Marland Kitchen PAF (paroxysmal atrial fibrillation) ([place])   . Pinched nerve in neck   . SDH (subdural hematoma) ([place])   . Thrombocytopenia ([place])    Past Surgical History:  Procedure Laterality Date  . BIOPSY  08/05/2018   Procedure: BIOPSY;  Surgeon: Danie Binder, MD;  [facility];  Service: Endoscopy;;  gastric   . CATARACT EXTRACTION, BILATERAL    . COLONOSCOPY  07/10/2011   Procedure: COLONOSCOPY;  Surgeon: Dorothyann Peng, MD;  [facility];  Service: Endoscopy;  Laterality: N/A;  10:30 AM  . CRANIOTOMY N/A 10/14/2016   Procedure: CRANIOTOMY HEMATOMA EVACUATION SUBDURAL;  Surgeon: Jovita Gamma, MD;  Location: [facility];  Service: Neurosurgery;  Laterality: N/A;  CRANIOTOMY HEMATOMA EVACUATION SUBDURAL  .  ESOPHAGOGASTRODUODENOSCOPY N/A 08/05/2018   Procedure: ESOPHAGOGASTRODUODENOSCOPY (EGD);  Surgeon: Danie Binder, MD;  [facility];  Service: Endoscopy;  Laterality: N/A;  3:00pm  . THYROIDECTOMY, PARTIAL    .  TONSILLECTOMY       SOCIAL HISTORY:  Social History   Socioeconomic History  . Marital status: Married    Spouse name: Not on file  . Number of children: Not on file  . Years of education: Not on file  . Highest education level: Not on file  Occupational History  . Not on file  Social Needs  . Financial resource strain: Not on file  . Food insecurity    Worry: Not on file    Inability: Not on file  . Transportation needs    Medical: Not on file    Non-medical: Not on file  Tobacco Use  . Smoking status: Former Smoker    Packs/day: 1.00    Years: 15.00    Pack years: 15.00    Quit date: 01/26/1971    Years since quitting: 48.2  . Smokeless tobacco: Never Used  Substance and Sexual Activity  . Alcohol use: No  . Drug use: No  . Sexual activity: Not on file  Lifestyle  . Physical activity    Days per week: Not on file    Minutes per session: Not on file  . Stress: Not on file  Relationships  . Social Herbalist on phone: Not on file    Gets together: Not on file    Attends religious service: Not on file    Active member of club or organization: Not on file    Attends meetings of clubs or organizations: Not on file    Relationship status: Not on file  . Intimate partner violence    Fear of current or ex partner: Not on file    Emotionally abused: Not on file    Physically abused: Not on file    Forced sexual activity: Not on file  Other Topics Concern  . Not on file  Social History Narrative  . Not on file    FAMILY HISTORY:  Family History  Problem Relation Age of Onset  . CAD Father 30  . Cancer Mother   . Alzheimer's disease Mother   . Colon cancer Neg Hx     CURRENT MEDICATIONS:  Outpatient Encounter Medications as of 04/14/2019  Medication Sig  . carvedilol (COREG) 6.25 MG tablet Take 1 tablet (6.25 mg total) by mouth 2 (two) times daily.  . Coenzyme Q10 (COQ-10) 200 MG CAPS Take 200 mg by mouth 2 (two) times daily.   .  diphenhydramine-acetaminophen (TYLENOL PM) 25-500 MG TABS tablet Take 1 tablet by mouth at bedtime as needed (sleep).   . hydrALAZINE (APRESOLINE) 25 MG tablet Take 25 mg by mouth 2 (two) times daily.  . Multiple Vitamin (MULITIVITAMIN WITH MINERALS) TABS Take 1 tablet by mouth daily.  Marland Kitchen omeprazole (PRILOSEC) 20 MG capsule 1 PO 30 MINS PRIOR TO BREAKFAST.  . sodium bicarbonate 650 MG tablet Take 1 tablet (650 mg total) by mouth 2 (two) times daily.  . tamsulosin (FLOMAX) 0.4 MG CAPS capsule Take 0.4 mg by mouth daily.   . [EXPIRED] influenza vaccine adjuvanted (FLUAD) injection 0.5 mL    No facility-administered encounter medications on file as of 04/14/2019.     ALLERGIES:  No Known Allergies   PHYSICAL EXAM:  ECOG Performance status: 1  Vitals:   04/14/19 0809  BP:  109/80  Pulse: 79  Resp: 16  Temp: (!) 97.1 F (36.2 C)  SpO2: 97%   There were no vitals filed for this visit.  Physical Exam Vitals signs reviewed.  Constitutional:      Appearance: Normal appearance.  Cardiovascular:     Rate and Rhythm: Normal rate and regular rhythm.     Heart sounds: Normal heart sounds.  Pulmonary:     Effort: Pulmonary effort is normal.     Breath sounds: Normal breath sounds.  Abdominal:     General: There is no distension.     Palpations: Abdomen is soft. There is no mass.  Musculoskeletal:        General: No swelling.  Lymphadenopathy:     Cervical: Cervical adenopathy present.  Skin:    General: Skin is warm.  Neurological:     General: No focal deficit present.     Mental Status: He is alert and oriented to person, place, and time.  Psychiatric:        Mood and Affect: Mood normal.        Behavior: Behavior normal.      LABORATORY DATA:  I have reviewed the labs as listed.  CBC    Component Value Date/Time   WBC 10.5 04/07/2019 0920   RBC 4.06 (L) 04/07/2019 0920   HGB 13.0 04/07/2019 0920   HCT 39.0 04/07/2019 0920   PLT 68 (L) 04/07/2019 0920   MCV 96.1  04/07/2019 0920   MCH 32.0 04/07/2019 0920   MCHC 33.3 04/07/2019 0920   RDW 12.5 04/07/2019 0920   LYMPHSABS 6.7 (H) 04/07/2019 0920   MONOABS 0.9 04/07/2019 0920   EOSABS 0.2 04/07/2019 0920   BASOSABS 0.0 04/07/2019 0920   CMP Latest Ref Rng & Units 04/07/2019 01/09/2019 11/07/2018  Glucose 70 - 99 mg/dL 117(H) 86 112(H)  BUN 8 - 23 mg/dL 47(H) 37(H) 43(H)  Creatinine 0.61 - 1.24 mg/dL 2.98(H) 2.82(H) 3.29(H)  Sodium 135 - 145 mmol/L 137 139 139  Potassium 3.5 - 5.1 mmol/L 4.0 3.9 3.5  Chloride 98 - 111 mmol/L 109 110 111  CO2 22 - 32 mmol/L 21(L) 20(L) 21(L)  Calcium 8.9 - 10.3 mg/dL 9.1 9.1 8.9  Total Protein 6.5 - 8.1 g/dL 6.4(L) 6.6 6.1(L)  Total Bilirubin 0.3 - 1.2 mg/dL 0.8 1.1 0.7  Alkaline Phos 38 - 126 U/L 51 54 50  AST 15 - 41 U/L 19 20 16   ALT 0 - 44 U/L 14 15 13        DIAGNOSTIC IMAGING:  I have independently reviewed the scans and discussed with the patient.   I have reviewed Venita Lick LPN's note and agree with the documentation.  I personally performed a face-to-face visit, made revisions and my assessment and plan is as follows.    ASSESSMENT & PLAN:   CLL (chronic lymphocytic leukemia) (HCC) 1.  CLL, Rai stage IV: - BM BX on 08/10/2016 demonstrating marrow involvement.  He also had diffuse bulky adenopathy.  CBC showed progressive thrombocytopenia. - Imbruvica 420 mg from 08/19/2016 through May 2018, discontinued secondary to subdural hematoma. - Subsequently offered chlorambucil and obinutuzumab.  He refused obinutuzumab. - Chlorambucil 30 mg every 2 weeks started towards the end of June 2019.  He tolerated it very well. - PET scan on 11/04/2018 showed stable compared to previous scan.  No hypermetabolic findings.  Mildly enlarged bilateral neck, bilateral axillary, mediastinal and bilateral pelvic lymph node which are non-hypermetabolic and are stable in size.  Stable mild  splenomegaly measuring 15.9 cm with no hypermetabolism. -Chlorambucil was  discontinued on 11/07/2018 due to severe weakness.Marland Kitchen  He felt better after stopping chlorambucil in terms of energy. -We reviewed blood work from today.  White count is 10.5 with 63% lymphocytes.  Hemoglobin is 13.  Platelet count is stable at 68.  Physical examination showed bilateral neck lymphadenopathy.  No palpable axillary or inguinal lymphadenopathy.  No palpable splenomegaly.  Patient does not have any B symptoms. -We will continue to monitor him in 3 months.  Treatment options on progression include venetoclax and obinutuzumab. -He did have a subdural hematoma which precludes him from the BTK inhibitor acalabrutinib.  2.  Barrett's esophagus: -EGD on 08/05/2018 showed Barrett's esophagus, gastritis and duodenitis.  EGD was done secondary to increased uptake on the PET scan.  3.  CKD: -Creatinine today is 2.98.     Orders placed this encounter:  Orders Placed This Encounter  Procedures  . CBC with Differential/Platelet  . Comprehensive metabolic panel  . Lactate dehydrogenase      Derek Jack, MD [place] [phone]

## 2019-04-14 NOTE — Assessment & Plan Note (Signed)
1.  CLL, Rai stage IV: - BM BX on 08/10/2016 demonstrating marrow involvement.  He also had diffuse bulky adenopathy.  CBC showed progressive thrombocytopenia. - Imbruvica 420 mg from 08/19/2016 through May 2018, discontinued secondary to subdural hematoma. - Subsequently offered chlorambucil and obinutuzumab.  He refused obinutuzumab. - Chlorambucil 30 mg every 2 weeks started towards the end of June 2019.  He tolerated it very well. - PET scan on 11/04/2018 showed stable compared to previous scan.  No hypermetabolic findings.  Mildly enlarged bilateral neck, bilateral axillary, mediastinal and bilateral pelvic lymph node which are non-hypermetabolic and are stable in size.  Stable mild splenomegaly measuring 15.9 cm with no hypermetabolism. -Chlorambucil was discontinued on 11/07/2018 due to severe weakness.Andrew [name]  He felt better after stopping chlorambucil in terms of energy. -We reviewed blood work from today.  White count is 10.5 with 63% lymphocytes.  Hemoglobin is 13.  Platelet count is stable at 68.  Physical examination showed bilateral neck lymphadenopathy.  No palpable axillary or inguinal lymphadenopathy.  No palpable splenomegaly.  Patient does not have any B symptoms. -We will continue to monitor him in 3 months.  Treatment options on progression include venetoclax and obinutuzumab. -He did have a subdural hematoma which precludes him from the BTK inhibitor acalabrutinib.  2.  Barrett's esophagus: -EGD on 08/05/2018 showed Barrett's esophagus, gastritis and duodenitis.  EGD was done secondary to increased uptake on the PET scan.  3.  CKD: -Creatinine today is 2.98.

## 2019-04-14 NOTE — Patient Instructions (Addendum)
[facility] at [facility] Discharge Instructions  You were seen today by Dr. Katragadda. He went over your recent lab results. He will see you back in 3 months for labs and follow up.   Thank you for choosing [facility] at [facility] to provide your oncology and hematology care.  To afford each patient quality time with our provider, please arrive at least 15 minutes before your scheduled appointment time.   If you have a lab appointment with the Cancer Center please come in thru the  Main Entrance and check in at the main information desk  You need to re-schedule your appointment should you arrive 10 or more minutes late.  We strive to give you quality time with our providers, and arriving late affects you and other patients whose appointments are after yours.  Also, if you no show three or more times for appointments you may be dismissed from the clinic at the providers discretion.     Again, thank you for choosing [facility].  Our hope is that these requests will decrease the amount of time that you wait before being seen by our physicians.       _____________________________________________________________  Should you have questions after your visit to [facility], please contact our office at [phone] between the hours of 8:00 a.m. and 4:30 p.m.  Voicemails left after 4:00 p.m. will not be returned until the following business day.  For prescription refill requests, have your pharmacy contact our office and allow 72 hours.    Cancer Center Support Programs:   > Cancer Support Group  2nd Tuesday of the month 1pm-2pm, Journey Room    

## 2019-05-08 DIAGNOSIS — C9110 Chronic lymphocytic leukemia of B-cell type not having achieved remission: Secondary | ICD-10-CM | POA: Diagnosis not present

## 2019-05-08 DIAGNOSIS — D696 Thrombocytopenia, unspecified: Secondary | ICD-10-CM | POA: Diagnosis not present

## 2019-05-08 DIAGNOSIS — M79672 Pain in left foot: Secondary | ICD-10-CM | POA: Diagnosis not present

## 2019-05-08 DIAGNOSIS — R7301 Impaired fasting glucose: Secondary | ICD-10-CM | POA: Diagnosis not present

## 2019-05-08 DIAGNOSIS — I10 Essential (primary) hypertension: Secondary | ICD-10-CM | POA: Diagnosis not present

## 2019-05-08 DIAGNOSIS — N400 Enlarged prostate without lower urinary tract symptoms: Secondary | ICD-10-CM | POA: Diagnosis not present

## 2019-05-08 DIAGNOSIS — D649 Anemia, unspecified: Secondary | ICD-10-CM | POA: Diagnosis not present

## 2019-05-08 DIAGNOSIS — K22711 Barrett's esophagus with high grade dysplasia: Secondary | ICD-10-CM | POA: Diagnosis not present

## 2019-05-08 DIAGNOSIS — G9009 Other idiopathic peripheral autonomic neuropathy: Secondary | ICD-10-CM | POA: Diagnosis not present

## 2019-05-14 DIAGNOSIS — D696 Thrombocytopenia, unspecified: Secondary | ICD-10-CM | POA: Diagnosis not present

## 2019-05-14 DIAGNOSIS — E7849 Other hyperlipidemia: Secondary | ICD-10-CM | POA: Diagnosis not present

## 2019-05-14 DIAGNOSIS — I10 Essential (primary) hypertension: Secondary | ICD-10-CM | POA: Diagnosis not present

## 2019-05-14 DIAGNOSIS — D649 Anemia, unspecified: Secondary | ICD-10-CM | POA: Diagnosis not present

## 2019-05-14 DIAGNOSIS — C9110 Chronic lymphocytic leukemia of B-cell type not having achieved remission: Secondary | ICD-10-CM | POA: Diagnosis not present

## 2019-05-14 DIAGNOSIS — I129 Hypertensive chronic kidney disease with stage 1 through stage 4 chronic kidney disease, or unspecified chronic kidney disease: Secondary | ICD-10-CM | POA: Diagnosis not present

## 2019-05-14 DIAGNOSIS — K22711 Barrett's esophagus with high grade dysplasia: Secondary | ICD-10-CM | POA: Diagnosis not present

## 2019-05-14 DIAGNOSIS — N400 Enlarged prostate without lower urinary tract symptoms: Secondary | ICD-10-CM | POA: Diagnosis not present

## 2019-05-14 DIAGNOSIS — N183 Chronic kidney disease, stage 3 unspecified: Secondary | ICD-10-CM | POA: Diagnosis not present

## 2019-05-14 DIAGNOSIS — R7301 Impaired fasting glucose: Secondary | ICD-10-CM | POA: Diagnosis not present

## 2019-05-26 ENCOUNTER — Encounter: Payer: Self-pay | Admitting: Cardiology

## 2019-05-26 ENCOUNTER — Other Ambulatory Visit: Payer: Self-pay

## 2019-05-26 ENCOUNTER — Ambulatory Visit (INDEPENDENT_AMBULATORY_CARE_PROVIDER_SITE_OTHER): Payer: Medicare HMO | Admitting: Cardiology

## 2019-05-26 VITALS — BP 128/81 | HR 60 | Temp 97.1°F | Ht 74.0 in | Wt 222.0 lb

## 2019-05-26 DIAGNOSIS — I10 Essential (primary) hypertension: Secondary | ICD-10-CM

## 2019-05-26 DIAGNOSIS — I712 Thoracic aortic aneurysm, without rupture, unspecified: Secondary | ICD-10-CM

## 2019-05-26 DIAGNOSIS — I480 Paroxysmal atrial fibrillation: Secondary | ICD-10-CM | POA: Diagnosis not present

## 2019-05-26 NOTE — Progress Notes (Signed)
Clinical Summary Mr. Aument is a 78 y.o.male seen today for follow up of the following medical problems.  1. PAF - remote DCCV 15 years ago - per neurosurgery not a candidate for at least 2 months for antiplatelets or anticoag CHA2DS2 VASc score is at least 3, however given his subdural hematoma, he has not been a candidate for anticoagulation   - no recent palpitations - compliant withmeds  2. 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 Thrombocytopenia - followed by heme   5. HTN - compliant with meds   6.[facility] of [facility] -he reports being told several year ago that his LVEF was 35%, this was around the time of his first diagnosis of afib. - records are not available at this time - recent echo with normal LVEF 55-60%.    7. CKD - followed at [facility] - 03/2019 Cr 2.98  8. 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 CKD (chronic kidney disease), stage IV ([employer])   . CLL (chronic lymphocytic leukemia) ([place])   . Hypertension   . NICM (nonischemic cardiomyopathy) ([place])    a. cath 2007, EF 35%, minimal CAD. b. EF normal 2018.  Marland Kitchen PAF (paroxysmal atrial fibrillation) ([place])   . Pinched nerve in neck   . SDH (subdural hematoma) ([place])   . Thrombocytopenia (H[facility])      No Known Allergies   Current Outpatient Medications  Medication Sig Dispense Refill  . carvedilol (COREG) 6.25 MG tablet Take 1 tablet (6.25 mg total) by mouth 2 (two) times daily. 180 tablet 3  . Coenzyme Q10 (COQ-10) 200 MG CAPS Take 200 mg by mouth 2 (two) times daily.     . diphenhydramine-acetaminophen (TYLENOL PM) 25-500 MG TABS tablet Take 1 tablet by mouth at  bedtime as needed (sleep).     . hydrALAZINE (APRESOLINE) 25 MG tablet Take 25 mg by mouth 2 (two) times daily.    . Multiple Vitamin (MULITIVITAMIN WITH MINERALS) TABS Take 1 tablet by mouth daily.    Marland Kitchen omeprazole (PRILOSEC) 20 MG capsule 1 PO 30 MINS PRIOR TO BREAKFAST. 90 capsule 3  . sodium bicarbonate 650 MG tablet Take 1 tablet (650 mg total) by mouth 2 (two) times daily. 60 tablet 0  . tamsulosin (FLOMAX) 0.4 MG CAPS capsule Take 0.4 mg by mouth daily.      No current facility-administered medications for this visit.     Past Surgical History:  Procedure Laterality Date  . BIOPSY  08/05/2018   Procedure: BIOPSY;  Surgeon: Danie Binder, MD;  [facility];  Service: Endoscopy;;  gastric   . CATARACT EXTRACTION, BILATERAL    . COLONOSCOPY  07/10/2011   Procedure: COLONOSCOPY;  Surgeon: Dorothyann Peng, MD;  [facility];  Service: Endoscopy;  Laterality: N/A;  10:30 AM  . CRANIOTOMY N/A 10/14/2016   Procedure: CRANIOTOMY HEMATOMA EVACUATION SUBDURAL;  Surgeon: Jovita Gamma, MD;  Location: [facility];  Service: Neurosurgery;  Laterality: N/A;  CRANIOTOMY HEMATOMA EVACUATION SUBDURAL  . ESOPHAGOGASTRODUODENOSCOPY N/A 08/05/2018   Procedure: ESOPHAGOGASTRODUODENOSCOPY (EGD);  Surgeon: Danie Binder, MD;  [facility];  Service: Endoscopy;  Laterality: N/A;  3:00pm  . THYROIDECTOMY, PARTIAL    . TONSILLECTOMY       No Known Allergies    Family History  Problem Relation Age of Onset  . CAD Father 52  . Cancer Mother   . Alzheimer's disease Mother   . Colon cancer Neg Hx      Social History Mr. Pasternak reports that he quit smoking about 48 years ago. He has a 15.00 pack-year smoking history. He has never used smokeless tobacco. Mr. Strollo reports no history of alcohol use.   Review of Systems CONSTITUTIONAL: No weight loss, fever, chills, weakness or fatigue.  HEENT: Eyes: No visual loss, blurred vision, double vision or yellow sclerae.No  hearing loss, sneezing, congestion, runny nose or sore throat.  SKIN: No rash or itching.  CARDIOVASCULAR: per hpi RESPIRATORY: No shortness of breath, cough or sputum.  GASTROINTESTINAL: No anorexia, nausea, vomiting or diarrhea. No abdominal pain or blood.  GENITOURINARY: No burning on urination, no polyuria NEUROLOGICAL: No headache, dizziness, syncope, paralysis, ataxia, numbness or tingling in the extremities. No change in bowel or bladder control.  MUSCULOSKELETAL: No muscle, back pain, joint pain or stiffness.  LYMPHATICS: No enlarged nodes. No history of splenectomy.  PSYCHIATRIC: No history of depression or anxiety.  ENDOCRINOLOGIC: No reports of sweating, cold or heat intolerance. No polyuria or polydipsia.  Marland Kitchen   Physical Examination Today's Vitals   05/26/19 0852  BP: 128/81  Pulse: 60  Temp: (!) 97.1 F (36.2 C)  TempSrc: Temporal  Weight: 222 lb (100.7 kg)  Height: 6\' 2"  (1.88 m)   Body mass index is 28.5 kg/m.  Gen: resting comfortably, no acute distress HEENT: no scleral icterus, pupils equal round and reactive, no palptable cervical adenopathy,  CV: RRR, no m/r/g no jvd Resp: Clear to auscultation bilaterally GI: abdomen is soft, non-tender, non-distended, normal bowel sounds, no hepatosplenomegaly MSK: extremities are warm, no edema.  Skin: warm, no rash Neuro:  no focal deficits Psych: appropriate affect   Diagnostic Studies  09/2016 echo Study Conclusions  - Left ventricle: The cavity size was normal. There was mild concentric hypertrophy. Systolic function was normal. The estimated ejection fraction was in the range of 55% to 60%. Wall motion was normal; there were no regional wall motion abnormalities. - Aortic valve: There was trivial regurgitation. - Left atrium: The atrium was mildly dilated.   Assessment and Plan  1. PAF -no recent symptoms - not on anticoag due to chronic thrombocytopenia and prior SDH. Platelets remain low in  the 60s.  - no change in therapy  2. HTN -at goal, continue current meds   3. Aortic aneurysm - mild, noted by recent PET scan - stable by repeat imaging. He is getting regular PETs related to his cancer, we will monitor results as they also detect the aneurysm   F/u 6 months      [name], M.D.

## 2019-05-26 NOTE — Patient Instructions (Signed)

## 2019-06-13 DIAGNOSIS — C9110 Chronic lymphocytic leukemia of B-cell type not having achieved remission: Secondary | ICD-10-CM | POA: Diagnosis not present

## 2019-06-13 DIAGNOSIS — I129 Hypertensive chronic kidney disease with stage 1 through stage 4 chronic kidney disease, or unspecified chronic kidney disease: Secondary | ICD-10-CM | POA: Diagnosis not present

## 2019-06-13 DIAGNOSIS — E782 Mixed hyperlipidemia: Secondary | ICD-10-CM | POA: Diagnosis not present

## 2019-06-13 DIAGNOSIS — D696 Thrombocytopenia, unspecified: Secondary | ICD-10-CM | POA: Diagnosis not present

## 2019-06-13 DIAGNOSIS — I4891 Unspecified atrial fibrillation: Secondary | ICD-10-CM | POA: Diagnosis not present

## 2019-06-13 DIAGNOSIS — I10 Essential (primary) hypertension: Secondary | ICD-10-CM | POA: Diagnosis not present

## 2019-06-13 DIAGNOSIS — R7301 Impaired fasting glucose: Secondary | ICD-10-CM | POA: Diagnosis not present

## 2019-06-13 DIAGNOSIS — N400 Enlarged prostate without lower urinary tract symptoms: Secondary | ICD-10-CM | POA: Diagnosis not present

## 2019-06-13 DIAGNOSIS — D649 Anemia, unspecified: Secondary | ICD-10-CM | POA: Diagnosis not present

## 2019-06-13 DIAGNOSIS — N184 Chronic kidney disease, stage 4 (severe): Secondary | ICD-10-CM | POA: Diagnosis not present

## 2019-06-21 ENCOUNTER — Ambulatory Visit: Payer: Medicare HMO | Attending: Internal Medicine

## 2019-06-21 DIAGNOSIS — Z23 Encounter for immunization: Secondary | ICD-10-CM | POA: Insufficient documentation

## 2019-06-21 NOTE — Progress Notes (Signed)
   Covid-19 Vaccination Clinic  Name:  Andrew [name]    MRN: [id number] DOB: 1940/11/16  06/21/2019  [name] was observed post Covid-19 immunization for 15 minutes without incidence. He was provided with Vaccine Information Sheet and instruction to access the V-Safe system.   [name] was instructed to call 911 with any severe reactions post vaccine: Marland Kitchen Difficulty breathing  . Swelling of your face and throat  . A fast heartbeat  . A bad rash all over your body  . Dizziness and weakness    Immunizations Administered    Name Date Dose VIS Date Route   Pfizer COVID-19 Vaccine 06/21/2019 11:35 AM 0.3 mL 05/09/2019 Intramuscular   Manufacturer: [place]   Lot: [id number]   [place]: [id number]

## 2019-07-12 ENCOUNTER — Ambulatory Visit: Payer: Medicare HMO | Attending: Internal Medicine

## 2019-07-12 DIAGNOSIS — Z23 Encounter for immunization: Secondary | ICD-10-CM | POA: Insufficient documentation

## 2019-07-12 NOTE — Progress Notes (Signed)
   Covid-19 Vaccination Clinic  Name:  Andrew [name]    MRN: [id number] DOB: Jan 27, 1941  07/12/2019  [name] was observed post Covid-19 immunization for 15 minutes without incidence. He was provided with Vaccine Information Sheet and instruction to access the V-Safe system.   [name] was instructed to call 911 with any severe reactions post vaccine: Marland Kitchen Difficulty breathing  . Swelling of your face and throat  . A fast heartbeat  . A bad rash all over your body  . Dizziness and weakness    Immunizations Administered    Name Date Dose VIS Date Route   Pfizer COVID-19 Vaccine 07/12/2019  9:32 AM 0.3 mL 05/09/2019 Intramuscular   Manufacturer: Joseph   Lot: [id number]   [place]: [id number]

## 2019-07-17 ENCOUNTER — Inpatient Hospital Stay (HOSPITAL_COMMUNITY): Payer: Medicare HMO | Attending: Hematology

## 2019-07-17 DIAGNOSIS — N184 Chronic kidney disease, stage 4 (severe): Secondary | ICD-10-CM | POA: Diagnosis not present

## 2019-07-17 DIAGNOSIS — D696 Thrombocytopenia, unspecified: Secondary | ICD-10-CM | POA: Diagnosis not present

## 2019-07-17 DIAGNOSIS — I129 Hypertensive chronic kidney disease with stage 1 through stage 4 chronic kidney disease, or unspecified chronic kidney disease: Secondary | ICD-10-CM | POA: Diagnosis not present

## 2019-07-17 DIAGNOSIS — D649 Anemia, unspecified: Secondary | ICD-10-CM | POA: Diagnosis not present

## 2019-07-17 DIAGNOSIS — E782 Mixed hyperlipidemia: Secondary | ICD-10-CM | POA: Diagnosis not present

## 2019-07-17 DIAGNOSIS — I4891 Unspecified atrial fibrillation: Secondary | ICD-10-CM | POA: Diagnosis not present

## 2019-07-17 DIAGNOSIS — R7301 Impaired fasting glucose: Secondary | ICD-10-CM | POA: Diagnosis not present

## 2019-07-17 DIAGNOSIS — C9110 Chronic lymphocytic leukemia of B-cell type not having achieved remission: Secondary | ICD-10-CM | POA: Diagnosis not present

## 2019-07-17 DIAGNOSIS — I10 Essential (primary) hypertension: Secondary | ICD-10-CM | POA: Diagnosis not present

## 2019-07-17 DIAGNOSIS — N400 Enlarged prostate without lower urinary tract symptoms: Secondary | ICD-10-CM | POA: Diagnosis not present

## 2019-07-17 LAB — CBC WITH DIFFERENTIAL/PLATELET
Abs Immature Granulocytes: 0.04 10*3/uL (ref 0.00–0.07)
Basophils Absolute: 0.1 10*3/uL (ref 0.0–0.1)
Basophils Relative: 0 %
Eosinophils Absolute: 0.2 10*3/uL (ref 0.0–0.5)
Eosinophils Relative: 1 %
HCT: 39 % (ref 39.0–52.0)
Hemoglobin: 12.9 g/dL — ABNORMAL LOW (ref 13.0–17.0)
Immature Granulocytes: 0 %
Lymphocytes Relative: 78 %
Lymphs Abs: 15.5 10*3/uL — ABNORMAL HIGH (ref 0.7–4.0)
MCH: 32.2 pg (ref 26.0–34.0)
MCHC: 33.1 g/dL (ref 30.0–36.0)
MCV: 97.3 fL (ref 80.0–100.0)
Monocytes Absolute: 1.2 10*3/uL — ABNORMAL HIGH (ref 0.1–1.0)
Monocytes Relative: 6 %
Neutro Abs: 3 10*3/uL (ref 1.7–7.7)
Neutrophils Relative %: 15 %
Platelets: 66 10*3/uL — ABNORMAL LOW (ref 150–400)
RBC: 4.01 MIL/uL — ABNORMAL LOW (ref 4.22–5.81)
RDW: 12.6 % (ref 11.5–15.5)
WBC: 20 10*3/uL — ABNORMAL HIGH (ref 4.0–10.5)
nRBC: 0 % (ref 0.0–0.2)

## 2019-07-17 LAB — COMPREHENSIVE METABOLIC PANEL
ALT: 14 U/L (ref 0–44)
AST: 18 U/L (ref 15–41)
Albumin: 4.3 g/dL (ref 3.5–5.0)
Alkaline Phosphatase: 54 U/L (ref 38–126)
Anion gap: 12 (ref 5–15)
BUN: 49 mg/dL — ABNORMAL HIGH (ref 8–23)
CO2: 21 mmol/L — ABNORMAL LOW (ref 22–32)
Calcium: 9.3 mg/dL (ref 8.9–10.3)
Chloride: 107 mmol/L (ref 98–111)
Creatinine, Ser: 4.01 mg/dL — ABNORMAL HIGH (ref 0.61–1.24)
GFR calc Af Amer: 15 mL/min — ABNORMAL LOW (ref >60)
GFR calc non Af Amer: 13 mL/min — ABNORMAL LOW (ref >60)
Glucose, Bld: 95 mg/dL (ref 70–99)
Potassium: 4 mmol/L (ref 3.5–5.1)
Sodium: 140 mmol/L (ref 135–145)
Total Bilirubin: 1.1 mg/dL (ref 0.3–1.2)
Total Protein: 6.5 g/dL (ref 6.5–8.1)

## 2019-07-17 LAB — LACTATE DEHYDROGENASE: LDH: 111 U/L (ref 98–192)

## 2019-07-23 ENCOUNTER — Encounter (HOSPITAL_COMMUNITY): Payer: Self-pay

## 2019-07-24 ENCOUNTER — Ambulatory Visit (HOSPITAL_COMMUNITY): Payer: Medicare HMO | Admitting: Hematology

## 2019-08-11 ENCOUNTER — Inpatient Hospital Stay (HOSPITAL_COMMUNITY): Payer: Medicare HMO | Attending: Hematology | Admitting: Hematology

## 2019-08-11 ENCOUNTER — Other Ambulatory Visit: Payer: Self-pay

## 2019-08-11 VITALS — BP 129/88 | HR 68 | Temp 96.9°F | Resp 18 | Wt 224.5 lb

## 2019-08-11 DIAGNOSIS — I480 Paroxysmal atrial fibrillation: Secondary | ICD-10-CM | POA: Diagnosis not present

## 2019-08-11 DIAGNOSIS — C9110 Chronic lymphocytic leukemia of B-cell type not having achieved remission: Secondary | ICD-10-CM | POA: Diagnosis not present

## 2019-08-11 DIAGNOSIS — I700 Atherosclerosis of aorta: Secondary | ICD-10-CM | POA: Insufficient documentation

## 2019-08-11 DIAGNOSIS — D696 Thrombocytopenia, unspecified: Secondary | ICD-10-CM | POA: Diagnosis not present

## 2019-08-11 DIAGNOSIS — Z79899 Other long term (current) drug therapy: Secondary | ICD-10-CM | POA: Diagnosis not present

## 2019-08-11 DIAGNOSIS — Z87891 Personal history of nicotine dependence: Secondary | ICD-10-CM | POA: Diagnosis not present

## 2019-08-11 DIAGNOSIS — I129 Hypertensive chronic kidney disease with stage 1 through stage 4 chronic kidney disease, or unspecified chronic kidney disease: Secondary | ICD-10-CM | POA: Insufficient documentation

## 2019-08-11 DIAGNOSIS — N184 Chronic kidney disease, stage 4 (severe): Secondary | ICD-10-CM | POA: Insufficient documentation

## 2019-08-11 DIAGNOSIS — R590 Localized enlarged lymph nodes: Secondary | ICD-10-CM | POA: Diagnosis not present

## 2019-08-11 DIAGNOSIS — I2510 Atherosclerotic heart disease of native coronary artery without angina pectoris: Secondary | ICD-10-CM | POA: Diagnosis not present

## 2019-08-11 DIAGNOSIS — R200 Anesthesia of skin: Secondary | ICD-10-CM | POA: Diagnosis not present

## 2019-08-11 MED ORDER — LANREOTIDE ACETATE 120 MG/0.5ML ~~LOC~~ SOLN
SUBCUTANEOUS | Status: AC
  Filled 2019-08-11: qty 120

## 2019-08-11 NOTE — Progress Notes (Signed)
Andrew [name], [place] [address]   CLINIC:  Medical Oncology/Hematology  PCP:  [name], MD [place] [address] [phone]   REASON FOR VISIT:  Follow-up for CLL (chronic lymphocytic leukemia)   BRIEF ONCOLOGIC HISTORY:  Oncology History  CLL (chronic lymphocytic leukemia) ([facility])  03/09/2015 Initial Diagnosis   CLL (chronic lymphocytic leukemia) ([facility])   08/03/2016 Imaging   CT neck- 1. Diffuse and bulky lymphadenopathy in keeping with history of CLL. Nodal enlargement has diffusely progressed since April 2016. 2. Chest and abdomen CT reported separately.   08/03/2016 Imaging   CT CAP- 1. Bulky lymphadenopathy in the chest, abdomen, and pelvis as described. 2. Splenomegaly. 3. Coronary artery and thoracoabdominal aortic atherosclerosis. 4. Prostatomegaly. 5. Several tiny lucent lesions in the bony anatomic pelvis, nonspecific, but attention on follow-up imaging recommended.   08/10/2016 Procedure   Bone marrow aspiration and biopsy by IR.   08/11/2016 Pathology Results   Bone Marrow, Aspirate,Biopsy, and Clot, left iliac crest - MARKED INVOLVEMENT BY CHRONIC LYMPHOCYTIC LEUKEMIA/SMALL LYMPHOCYTIC LYMPHOMA. PERIPHERAL BLOOD: - CHRONIC LYMPHOCYTIC LEUKEMIA. - THROMBOCYTOPENIA.   08/11/2016 Pathology Results   Bone Marrow Flow Cytometry - CHRONIC LYMPHOCYTIC LEUKEMIA/SMALL LYMPHOCYTIC LYMPHOMA.   08/16/2016 Pathology Results   Normal cytogenetics.  Molecular cytogenetic analysis by [facility] demonstrates a 13q-   08/19/2016 -  Chemotherapy   Imbruvica 420 mg daily       CANCER STAGING: Cancer Staging CLL (chronic lymphocytic leukemia) ([facility]) Staging form: Chronic Lymphocytic Leukemia / Small Lymphocytic Lymphoma, AJCC 8th Edition - Clinical stage from 09/04/2016: Modified Rai Stage IV ([place]: Stage C, Modified Rai risk: High, Lymphocytosis: Present, Adenopathy: Present, Organomegaly: Present, Anemia: Absent,  Thrombocytopenia: Present) - Signed by Baird Cancer, PA-C on 09/04/2016    INTERVAL HISTORY:  [name] 79 y.o. male seen for follow-up of CLL.  Appetite is 100%.  Energy levels are 75%.  Denies any fevers or infections in the last 3 months.  No night sweats or weight loss reported.  Numbness in the toes has been stable.  Denies any ER visits or hospitalizations.  No painful lymphadenopathy reported.    REVIEW OF SYSTEMS:  Review of Systems  Neurological: Positive for numbness.  All other systems reviewed and are negative.    PAST MEDICAL/SURGICAL HISTORY:  Past Medical History:  Diagnosis Date  . CKD (chronic kidney disease), stage IV ([facility])   . CLL (chronic lymphocytic leukemia) ([facility])   . Hypertension   . NICM (nonischemic cardiomyopathy) ([facility])    a. cath 2007, EF 35%, minimal CAD. b. EF normal 2018.  Marland Kitchen PAF (paroxysmal atrial fibrillation) ([facility])   . Pinched nerve in neck   . SDH (subdural hematoma) ([facility])   . Thrombocytopenia ([facility])    Past Surgical History:  Procedure Laterality Date  . BIOPSY  08/05/2018   Procedure: BIOPSY;  Surgeon: Danie Binder, MD;  [facility];  Service: Endoscopy;;  gastric   . CATARACT EXTRACTION, BILATERAL    . COLONOSCOPY  07/10/2011   Procedure: COLONOSCOPY;  Surgeon: Dorothyann Peng, MD;  [facility];  Service: Endoscopy;  Laterality: N/A;  10:30 AM  . CRANIOTOMY N/A 10/14/2016   Procedure: CRANIOTOMY HEMATOMA EVACUATION SUBDURAL;  Surgeon: Jovita Gamma, MD;  Location: [facility];  Service: Neurosurgery;  Laterality: N/A;  CRANIOTOMY HEMATOMA EVACUATION SUBDURAL  . ESOPHAGOGASTRODUODENOSCOPY N/A 08/05/2018   Procedure: ESOPHAGOGASTRODUODENOSCOPY (EGD);  Surgeon: Danie Binder, MD;  [facility];  Service: Endoscopy;  Laterality: N/A;  3:00pm  . THYROIDECTOMY, PARTIAL    . TONSILLECTOMY       SOCIAL HISTORY:  Social History   Socioeconomic History  . Marital status: Married    Spouse name: Not on file    . Number of children: Not on file  . Years of education: Not on file  . Highest education level: Not on file  Occupational History  . Not on file  Tobacco Use  . Smoking status: Former Smoker    Packs/day: 1.00    Years: 15.00    Pack years: 15.00    Quit date: 01/26/1971    Years since quitting: 48.5  . Smokeless tobacco: Never Used  Substance and Sexual Activity  . Alcohol use: No  . Drug use: No  . Sexual activity: Not on file  Other Topics Concern  . Not on file  Social History Narrative  . Not on file   Social Determinants of Health   Financial Resource Strain:   . Difficulty of Paying Living Expenses:   Food Insecurity:   . Worried About Charity fundraiser in the Last Year:   . Arboriculturist in the Last Year:   Transportation Needs:   . Film/video editor (Medical):   Marland Kitchen Lack of Transportation (Non-Medical):   Physical Activity:   . Days of Exercise per Week:   . Minutes of Exercise per Session:   Stress:   . Feeling of Stress :   Social Connections:   . Frequency of Communication with Friends and Family:   . Frequency of Social Gatherings with Friends and Family:   . Attends Religious Services:   . Active Member of Clubs or Organizations:   . Attends Archivist Meetings:   Marland Kitchen Marital Status:   Intimate Partner Violence:   . Fear of Current or Ex-Partner:   . Emotionally Abused:   Marland Kitchen Physically Abused:   . Sexually Abused:     FAMILY HISTORY:  Family History  Problem Relation Age of Onset  . CAD Father 53  . Cancer Mother   . Alzheimer's disease Mother   . Colon cancer Neg Hx     CURRENT MEDICATIONS:  Outpatient Encounter Medications as of 08/11/2019  Medication Sig  . carvedilol (COREG) 6.25 MG tablet Take 1 tablet (6.25 mg total) by mouth 2 (two) times daily.  . Coenzyme Q10 (COQ-10) 200 MG CAPS Take 200 mg by mouth 2 (two) times daily.   . Cyanocobalamin (VITAMIN B12) 1000 MCG TBCR Take 1,000 mcg by mouth daily.   . hydrALAZINE  (APRESOLINE) 25 MG tablet Take 25 mg by mouth 2 (two) times daily.  . Multiple Vitamin (MULITIVITAMIN WITH MINERALS) TABS Take 1 tablet by mouth daily.  Marland Kitchen omeprazole (PRILOSEC) 20 MG capsule 1 PO 30 MINS PRIOR TO BREAKFAST.  . sodium bicarbonate 650 MG tablet Take 1 tablet (650 mg total) by mouth 2 (two) times daily.  . tamsulosin (FLOMAX) 0.4 MG CAPS capsule Take 0.4 mg by mouth daily.   . diphenhydramine-acetaminophen (TYLENOL PM) 25-500 MG TABS tablet Take 1 tablet by mouth at bedtime as needed (sleep).   . [DISCONTINUED] sodium bicarbonate 325 MG tablet Take 325 mg by mouth 2 (two) times daily.   No facility-administered encounter medications on file as of 08/11/2019.    ALLERGIES:  No Known Allergies   PHYSICAL EXAM:  ECOG Performance status: 1  Vitals:   08/11/19 0826  BP: 129/88  Pulse: 68  Resp: 18  Temp: (!) 96.9 F (36.1 C)  SpO2: 99%   Filed Weights   08/11/19 0826  Weight: 224 lb 8 oz (101.8 kg)    Physical Exam Vitals reviewed.  Constitutional:      Appearance: Normal appearance.  Cardiovascular:     Rate and Rhythm: Normal rate and regular rhythm.     Heart sounds: Normal heart sounds.  Pulmonary:     Effort: Pulmonary effort is normal.     Breath sounds: Normal breath sounds.  Abdominal:     General: There is no distension.     Palpations: Abdomen is soft. There is no mass.  Musculoskeletal:        General: No swelling.  Lymphadenopathy:     Cervical: Cervical adenopathy present.  Skin:    General: Skin is warm.  Neurological:     General: No focal deficit present.     Mental Status: He is alert and oriented to person, place, and time.  Psychiatric:        Mood and Affect: Mood normal.        Behavior: Behavior normal.    Right axillary lymph nodes palpable.  LABORATORY DATA:  I have reviewed the labs as listed.  CBC    Component Value Date/Time   WBC 20.0 (H) 07/17/2019 0820   RBC 4.01 (L) 07/17/2019 0820   HGB 12.9 (L) 07/17/2019  0820   HCT 39.0 07/17/2019 0820   PLT 66 (L) 07/17/2019 0820   MCV 97.3 07/17/2019 0820   MCH 32.2 07/17/2019 0820   MCHC 33.1 07/17/2019 0820   RDW 12.6 07/17/2019 0820   LYMPHSABS 15.5 (H) 07/17/2019 0820   MONOABS 1.2 (H) 07/17/2019 0820   EOSABS 0.2 07/17/2019 0820   BASOSABS 0.1 07/17/2019 0820   CMP Latest Ref Rng & Units 07/17/2019 04/07/2019 01/09/2019  Glucose 70 - 99 mg/dL 95 117(H) 86  BUN 8 - 23 mg/dL 49(H) 47(H) 37(H)  Creatinine 0.61 - 1.24 mg/dL 4.01(H) 2.98(H) 2.82(H)  Sodium 135 - 145 mmol/L 140 137 139  Potassium 3.5 - 5.1 mmol/L 4.0 4.0 3.9  Chloride 98 - 111 mmol/L 107 109 110  CO2 22 - 32 mmol/L 21(L) 21(L) 20(L)  Calcium 8.9 - 10.3 mg/dL 9.3 9.1 9.1  Total Protein 6.5 - 8.1 g/dL 6.5 6.4(L) 6.6  Total Bilirubin 0.3 - 1.2 mg/dL 1.1 0.8 1.1  Alkaline Phos 38 - 126 U/L 54 51 54  AST 15 - 41 U/L 18 19 20   ALT 0 - 44 U/L 14 14 15        DIAGNOSTIC IMAGING:  I have independently reviewed the scans.   I have reviewed Venita Lick LPN's note and agree with the documentation.  I personally performed a face-to-face visit, made revisions and my assessment and plan is as follows.    ASSESSMENT & PLAN:   CLL (chronic lymphocytic leukemia) (H[facility]) 1.  CLL, Rai stage IV: - BM BX on 08/10/2016 demonstrating marrow involvement.  He also had diffuse bulky adenopathy.  CBC showed progressive thrombocytopenia. - Imbruvica 420 mg from 08/19/2016 through May 2018, discontinued secondary to subdural hematoma. - Subsequently offered chlorambucil and obinutuzumab.  He refused obinutuzumab. - Chlorambucil 30 mg every 2 weeks started towards the end of June 2019.  He tolerated it very well. - PET scan on 11/04/2018 showed stable compared to previous scan.  No hypermetabolic findings.  Mildly enlarged bilateral neck, bilateral axillary, mediastinal and bilateral pelvic lymph node which are non-hypermetabolic and are stable in  size.  Stable mild splenomegaly measuring 15.9 cm with no  hypermetabolism. -Chlorambucil was discontinued on 11/07/2018 due to severe weakness.  Weakness improved after discontinuation.. -We have reviewed his blood work today.  White count increased to 20.  Platelet count is stable at 66.  Hemoglobin is also stable. -LDH was normal. -Physical exam today shows adenopathy in the neck region, left more than right.  Right axillary lymphadenopathy present. -He does not have any B symptoms.  I will monitor him in 3 months.  If treatment indicated, will consider venetoclax and obinutuzumab.  He cannot get be taking any better because of subdural hematoma.  2.  Barrett's esophagus: -EGD on 08/05/2018 showed Barrett's esophagus, gastritis and duodenitis.  EGD was done secondary to increased uptake on the PET scan.  3.  CKD: -His baseline creatinine is around 3.  This is increased to 4.  He has follow-up with nephrology next week.     Orders placed this encounter:  Orders Placed This Encounter  Procedures  . CBC with Differential/Platelet  . Comprehensive metabolic panel  . Lactate dehydrogenase      Andrew Jack, MD [place] [phone]

## 2019-08-11 NOTE — Patient Instructions (Addendum)
[facility] at [facility] Discharge Instructions  You were seen today by Dr. Delton Coombes. He went over your recent lab results. Please follow up with your Kidney doctor as scheduled. He will see you back in 3 months for labs and follow up.   Thank you for choosing [facility] at [facility] to provide your oncology and hematology care.  To afford each patient quality time with our provider, please arrive at least 15 minutes before your scheduled appointment time.   If you have a lab appointment with the [facility] please come in thru the  Main Entrance and check in at the main information desk  You need to re-schedule your appointment should you arrive 10 or more minutes late.  We strive to give you quality time with our providers, and arriving late affects you and other patients whose appointments are after yours.  Also, if you no show three or more times for appointments you may be dismissed from the clinic at the providers discretion.     Again, thank you for choosing [facility].  Our hope is that these requests will decrease the amount of time that you wait before being seen by our physicians.       _____________________________________________________________  Should you have questions after your visit to [facility], please contact our office at [phone] between the hours of 8:00 a.m. and 4:30 p.m.  Voicemails left after 4:00 p.m. will not be returned until the following business day.  For prescription refill requests, have your pharmacy contact our office and allow 72 hours.    Cancer Center Support Programs:   > Cancer Support Group  2nd Tuesday of the month 1pm-2pm, Journey Room

## 2019-08-11 NOTE — Assessment & Plan Note (Signed)
1.  CLL, Rai stage IV: - BM BX on 08/10/2016 demonstrating marrow involvement.  He also had diffuse bulky adenopathy.  CBC showed progressive thrombocytopenia. - Imbruvica 420 mg from 08/19/2016 through May 2018, discontinued secondary to subdural hematoma. - Subsequently offered chlorambucil and obinutuzumab.  He refused obinutuzumab. - Chlorambucil 30 mg every 2 weeks started towards the end of June 2019.  He tolerated it very well. - PET scan on 11/04/2018 showed stable compared to previous scan.  No hypermetabolic findings.  Mildly enlarged bilateral neck, bilateral axillary, mediastinal and bilateral pelvic lymph node which are non-hypermetabolic and are stable in size.  Stable mild splenomegaly measuring 15.9 cm with no hypermetabolism. -Chlorambucil was discontinued on 11/07/2018 due to severe weakness.  Weakness improved after discontinuation.. -We have reviewed his blood work today.  White count increased to 20.  Platelet count is stable at 66.  Hemoglobin is also stable. -LDH was normal. -Physical exam today shows adenopathy in the neck region, left more than right.  Right axillary lymphadenopathy present. -He does not have any B symptoms.  I will monitor him in 3 months.  If treatment indicated, will consider venetoclax and obinutuzumab.  He cannot get be taking any better because of subdural hematoma.  2.  Barrett's esophagus: -EGD on 08/05/2018 showed Barrett's esophagus, gastritis and duodenitis.  EGD was done secondary to increased uptake on the PET scan.  3.  CKD: -His baseline creatinine is around 3.  This is increased to 4.  He has follow-up with nephrology next week.

## 2019-08-18 DIAGNOSIS — Z0000 Encounter for general adult medical examination without abnormal findings: Secondary | ICD-10-CM | POA: Diagnosis not present

## 2019-08-18 DIAGNOSIS — Z6827 Body mass index (BMI) 27.0-27.9, adult: Secondary | ICD-10-CM | POA: Diagnosis not present

## 2019-08-18 DIAGNOSIS — D649 Anemia, unspecified: Secondary | ICD-10-CM | POA: Diagnosis not present

## 2019-08-18 DIAGNOSIS — I10 Essential (primary) hypertension: Secondary | ICD-10-CM | POA: Diagnosis not present

## 2019-08-18 DIAGNOSIS — R7301 Impaired fasting glucose: Secondary | ICD-10-CM | POA: Diagnosis not present

## 2019-08-18 DIAGNOSIS — Z09 Encounter for follow-up examination after completed treatment for conditions other than malignant neoplasm: Secondary | ICD-10-CM | POA: Diagnosis not present

## 2019-08-18 DIAGNOSIS — E782 Mixed hyperlipidemia: Secondary | ICD-10-CM | POA: Diagnosis not present

## 2019-08-18 DIAGNOSIS — E6609 Other obesity due to excess calories: Secondary | ICD-10-CM | POA: Diagnosis not present

## 2019-08-18 DIAGNOSIS — G589 Mononeuropathy, unspecified: Secondary | ICD-10-CM | POA: Diagnosis not present

## 2019-08-18 DIAGNOSIS — D696 Thrombocytopenia, unspecified: Secondary | ICD-10-CM | POA: Diagnosis not present

## 2019-08-18 DIAGNOSIS — Z23 Encounter for immunization: Secondary | ICD-10-CM | POA: Diagnosis not present

## 2019-08-18 DIAGNOSIS — R944 Abnormal results of kidney function studies: Secondary | ICD-10-CM | POA: Diagnosis not present

## 2019-08-18 DIAGNOSIS — H02843 Edema of right eye, unspecified eyelid: Secondary | ICD-10-CM | POA: Diagnosis not present

## 2019-08-18 DIAGNOSIS — E7849 Other hyperlipidemia: Secondary | ICD-10-CM | POA: Diagnosis not present

## 2019-08-18 DIAGNOSIS — C9110 Chronic lymphocytic leukemia of B-cell type not having achieved remission: Secondary | ICD-10-CM | POA: Diagnosis not present

## 2019-08-18 DIAGNOSIS — G9009 Other idiopathic peripheral autonomic neuropathy: Secondary | ICD-10-CM | POA: Diagnosis not present

## 2019-08-21 DIAGNOSIS — I4891 Unspecified atrial fibrillation: Secondary | ICD-10-CM | POA: Diagnosis not present

## 2019-08-21 DIAGNOSIS — C9110 Chronic lymphocytic leukemia of B-cell type not having achieved remission: Secondary | ICD-10-CM | POA: Diagnosis not present

## 2019-08-21 DIAGNOSIS — Z0001 Encounter for general adult medical examination with abnormal findings: Secondary | ICD-10-CM | POA: Diagnosis not present

## 2019-08-21 DIAGNOSIS — E782 Mixed hyperlipidemia: Secondary | ICD-10-CM | POA: Diagnosis not present

## 2019-08-21 DIAGNOSIS — I10 Essential (primary) hypertension: Secondary | ICD-10-CM | POA: Diagnosis not present

## 2019-08-21 DIAGNOSIS — N400 Enlarged prostate without lower urinary tract symptoms: Secondary | ICD-10-CM | POA: Diagnosis not present

## 2019-08-21 DIAGNOSIS — D696 Thrombocytopenia, unspecified: Secondary | ICD-10-CM | POA: Diagnosis not present

## 2019-08-21 DIAGNOSIS — D649 Anemia, unspecified: Secondary | ICD-10-CM | POA: Diagnosis not present

## 2019-08-21 DIAGNOSIS — K22711 Barrett's esophagus with high grade dysplasia: Secondary | ICD-10-CM | POA: Diagnosis not present

## 2019-08-21 DIAGNOSIS — N184 Chronic kidney disease, stage 4 (severe): Secondary | ICD-10-CM | POA: Diagnosis not present

## 2019-08-21 DIAGNOSIS — R7301 Impaired fasting glucose: Secondary | ICD-10-CM | POA: Diagnosis not present

## 2019-09-04 DIAGNOSIS — N189 Chronic kidney disease, unspecified: Secondary | ICD-10-CM | POA: Diagnosis not present

## 2019-09-04 DIAGNOSIS — I129 Hypertensive chronic kidney disease with stage 1 through stage 4 chronic kidney disease, or unspecified chronic kidney disease: Secondary | ICD-10-CM | POA: Diagnosis not present

## 2019-09-04 DIAGNOSIS — N184 Chronic kidney disease, stage 4 (severe): Secondary | ICD-10-CM | POA: Diagnosis not present

## 2019-09-04 DIAGNOSIS — N200 Calculus of kidney: Secondary | ICD-10-CM | POA: Diagnosis not present

## 2019-09-04 DIAGNOSIS — N179 Acute kidney failure, unspecified: Secondary | ICD-10-CM | POA: Diagnosis not present

## 2019-09-04 DIAGNOSIS — N12 Tubulo-interstitial nephritis, not specified as acute or chronic: Secondary | ICD-10-CM | POA: Diagnosis not present

## 2019-09-04 DIAGNOSIS — C9110 Chronic lymphocytic leukemia of B-cell type not having achieved remission: Secondary | ICD-10-CM | POA: Diagnosis not present

## 2019-09-04 DIAGNOSIS — D696 Thrombocytopenia, unspecified: Secondary | ICD-10-CM | POA: Diagnosis not present

## 2019-09-08 ENCOUNTER — Other Ambulatory Visit: Payer: Self-pay | Admitting: Nephrology

## 2019-09-08 DIAGNOSIS — N184 Chronic kidney disease, stage 4 (severe): Secondary | ICD-10-CM

## 2019-09-11 ENCOUNTER — Ambulatory Visit
Admission: RE | Admit: 2019-09-11 | Discharge: 2019-09-11 | Disposition: A | Discharge status: Home / Self Care | Payer: Medicare HMO | Source: Ambulatory Visit | Attending: Nephrology | Admitting: Nephrology

## 2019-09-11 DIAGNOSIS — N184 Chronic kidney disease, stage 4 (severe): Secondary | ICD-10-CM

## 2019-09-11 DIAGNOSIS — N189 Chronic kidney disease, unspecified: Secondary | ICD-10-CM | POA: Diagnosis not present

## 2019-09-17 DIAGNOSIS — E782 Mixed hyperlipidemia: Secondary | ICD-10-CM | POA: Diagnosis not present

## 2019-09-17 DIAGNOSIS — I129 Hypertensive chronic kidney disease with stage 1 through stage 4 chronic kidney disease, or unspecified chronic kidney disease: Secondary | ICD-10-CM | POA: Diagnosis not present

## 2019-09-17 DIAGNOSIS — D696 Thrombocytopenia, unspecified: Secondary | ICD-10-CM | POA: Diagnosis not present

## 2019-09-17 DIAGNOSIS — N400 Enlarged prostate without lower urinary tract symptoms: Secondary | ICD-10-CM | POA: Diagnosis not present

## 2019-09-17 DIAGNOSIS — D649 Anemia, unspecified: Secondary | ICD-10-CM | POA: Diagnosis not present

## 2019-09-17 DIAGNOSIS — N184 Chronic kidney disease, stage 4 (severe): Secondary | ICD-10-CM | POA: Diagnosis not present

## 2019-09-17 DIAGNOSIS — R7301 Impaired fasting glucose: Secondary | ICD-10-CM | POA: Diagnosis not present

## 2019-09-17 DIAGNOSIS — I4891 Unspecified atrial fibrillation: Secondary | ICD-10-CM | POA: Diagnosis not present

## 2019-09-17 DIAGNOSIS — C9110 Chronic lymphocytic leukemia of B-cell type not having achieved remission: Secondary | ICD-10-CM | POA: Diagnosis not present

## 2019-09-17 DIAGNOSIS — I10 Essential (primary) hypertension: Secondary | ICD-10-CM | POA: Diagnosis not present

## 2019-09-18 DIAGNOSIS — Z85828 Personal history of other malignant neoplasm of skin: Secondary | ICD-10-CM | POA: Diagnosis not present

## 2019-09-18 DIAGNOSIS — X32XXXD Exposure to sunlight, subsequent encounter: Secondary | ICD-10-CM | POA: Diagnosis not present

## 2019-09-18 DIAGNOSIS — L570 Actinic keratosis: Secondary | ICD-10-CM | POA: Diagnosis not present

## 2019-09-18 DIAGNOSIS — Z08 Encounter for follow-up examination after completed treatment for malignant neoplasm: Secondary | ICD-10-CM | POA: Diagnosis not present

## 2019-10-08 DIAGNOSIS — E782 Mixed hyperlipidemia: Secondary | ICD-10-CM | POA: Diagnosis not present

## 2019-10-08 DIAGNOSIS — I10 Essential (primary) hypertension: Secondary | ICD-10-CM | POA: Diagnosis not present

## 2019-10-08 DIAGNOSIS — N184 Chronic kidney disease, stage 4 (severe): Secondary | ICD-10-CM | POA: Diagnosis not present

## 2019-10-08 DIAGNOSIS — N400 Enlarged prostate without lower urinary tract symptoms: Secondary | ICD-10-CM | POA: Diagnosis not present

## 2019-10-08 DIAGNOSIS — D696 Thrombocytopenia, unspecified: Secondary | ICD-10-CM | POA: Diagnosis not present

## 2019-10-08 DIAGNOSIS — R7301 Impaired fasting glucose: Secondary | ICD-10-CM | POA: Diagnosis not present

## 2019-10-08 DIAGNOSIS — D649 Anemia, unspecified: Secondary | ICD-10-CM | POA: Diagnosis not present

## 2019-10-08 DIAGNOSIS — C9110 Chronic lymphocytic leukemia of B-cell type not having achieved remission: Secondary | ICD-10-CM | POA: Diagnosis not present

## 2019-10-08 DIAGNOSIS — I4891 Unspecified atrial fibrillation: Secondary | ICD-10-CM | POA: Diagnosis not present

## 2019-10-08 DIAGNOSIS — I129 Hypertensive chronic kidney disease with stage 1 through stage 4 chronic kidney disease, or unspecified chronic kidney disease: Secondary | ICD-10-CM | POA: Diagnosis not present

## 2019-10-26 ENCOUNTER — Other Ambulatory Visit: Payer: Self-pay

## 2019-10-26 ENCOUNTER — Encounter: Payer: Self-pay | Admitting: Emergency Medicine

## 2019-10-26 ENCOUNTER — Ambulatory Visit
Admission: EM | Admit: 2019-10-26 | Discharge: 2019-10-26 | Disposition: A | Discharge status: Home / Self Care | Payer: Medicare HMO | Attending: Emergency Medicine | Admitting: Emergency Medicine

## 2019-10-26 DIAGNOSIS — J019 Acute sinusitis, unspecified: Secondary | ICD-10-CM | POA: Diagnosis not present

## 2019-10-26 DIAGNOSIS — Z20822 Contact with and (suspected) exposure to covid-19: Secondary | ICD-10-CM | POA: Diagnosis not present

## 2019-10-26 MED ORDER — AMOXICILLIN-POT CLAVULANATE 500-125 MG PO TABS: 1.0000 | ORAL_TABLET | Freq: Two times a day (BID) | ORAL | 0 refills | Status: AC

## 2019-10-26 MED ORDER — AMOXICILLIN-POT CLAVULANATE 500-125 MG PO TABS: 1.0000 | ORAL_TABLET | Freq: Two times a day (BID) | ORAL | 0 refills | Status: DC

## 2019-10-26 NOTE — Discharge Instructions (Signed)
COVID testing ordered.  It will take between 2-5 days for test results.  Someone will contact you regarding abnormal results.    In the meantime: You should remain isolated in your home for 10 days from symptom onset AND greater than 72 hours after symptoms resolution (absence of fever without the use of fever-reducing medication and improvement in respiratory symptoms), whichever is longer Get plenty of rest and push fluids Augmentin prescribed for sinus infection.  Take as directed and to completion Use OTC zyrtec for nasal congestion, runny nose, and/or sore throat Use OTC flonase for nasal congestion and runny nose Use medications daily for symptom relief Use OTC medications like ibuprofen or tylenol as needed fever or pain Follow up with PCP later this week for recheck and to ensure your symptoms are improving Call or go to the ED if you have any new or worsening symptoms such as fever, cough, shortness of breath, chest tightness, chest pain, turning blue, changes in mental status, etc..Marland Kitchen

## 2019-10-26 NOTE — ED Triage Notes (Signed)
Fever and congestion on and off x 1 week

## 2019-10-26 NOTE — ED Provider Notes (Signed)
[facility]   [id number] 10/26/19 Arrival Time: 4742   CC: COVID symptoms  SUBJECTIVE: History from: patient and family.  Andrew [name] is a 79 y.o. male hx significant for CKD and CLL, who presents with runny nose, sinus congestion and fever, tmax of 101.8 last night, 98.5 in office x 1 week.  Denies sick exposure to COVID, flu or strep. Received both pfizer vaccines earlier this year.  Has tried OTC medications with relief.  Denies aggravating factors.  Reports previous symptoms in the past.   Denies chills, fatigue, sore throat, SOB, wheezing, chest pain, nausea, vomiting, changes in bowel or bladder habits.    ROS: As per HPI.  All other pertinent ROS negative.     Past Medical History:  Diagnosis Date  . CKD (chronic kidney disease), stage IV ([place])   . CLL (chronic lymphocytic leukemia) ([place])   . Hypertension   . NICM (nonischemic cardiomyopathy) ([place])    a. cath 2007, EF 35%, minimal CAD. b. EF normal 2018.  Marland Kitchen PAF (paroxysmal atrial fibrillation) ([place])   . Pinched nerve in neck   . SDH (subdural hematoma) ([place])   . Thrombocytopenia ([place])    Past Surgical History:  Procedure Laterality Date  . BIOPSY  08/05/2018   Procedure: BIOPSY;  Surgeon: Danie Binder, MD;  [facility];  Service: Endoscopy;;  gastric   . CATARACT EXTRACTION, BILATERAL    . COLONOSCOPY  07/10/2011   Procedure: COLONOSCOPY;  Surgeon: Dorothyann Peng, MD;  [facility];  Service: Endoscopy;  Laterality: N/A;  10:30 AM  . CRANIOTOMY N/A 10/14/2016   Procedure: CRANIOTOMY HEMATOMA EVACUATION SUBDURAL;  Surgeon: Jovita Gamma, MD;  Location: [facility];  Service: Neurosurgery;  Laterality: N/A;  CRANIOTOMY HEMATOMA EVACUATION SUBDURAL  . ESOPHAGOGASTRODUODENOSCOPY N/A 08/05/2018   Procedure: ESOPHAGOGASTRODUODENOSCOPY (EGD);  Surgeon: Danie Binder, MD;  [facility];  Service: Endoscopy;  Laterality: N/A;  3:00pm  . THYROIDECTOMY, PARTIAL    . TONSILLECTOMY      No Known Allergies No current facility-administered medications on file prior to encounter.   Current Outpatient Medications on File Prior to Encounter  Medication Sig Dispense Refill  . carvedilol (COREG) 6.25 MG tablet Take 1 tablet (6.25 mg total) by mouth 2 (two) times daily. 180 tablet 3  . Coenzyme Q10 (COQ-10) 200 MG CAPS Take 200 mg by mouth 2 (two) times daily.     . Cyanocobalamin (VITAMIN B12) 1000 MCG TBCR Take 1,000 mcg by mouth daily.     . diphenhydramine-acetaminophen (TYLENOL PM) 25-500 MG TABS tablet Take 1 tablet by mouth at bedtime as needed (sleep).     . hydrALAZINE (APRESOLINE) 25 MG tablet Take 25 mg by mouth 2 (two) times daily.    . Multiple Vitamin (MULITIVITAMIN WITH MINERALS) TABS Take 1 tablet by mouth daily.    Marland Kitchen omeprazole (PRILOSEC) 20 MG capsule 1 PO 30 MINS PRIOR TO BREAKFAST. 90 capsule 3  . sodium bicarbonate 650 MG tablet Take 1 tablet (650 mg total) by mouth 2 (two) times daily. 60 tablet 0  . tamsulosin (FLOMAX) 0.4 MG CAPS capsule Take 0.4 mg by mouth daily.      Social History   Socioeconomic History  . Marital status: Married    Spouse name: Not on file  . Number of children: Not on file  . Years of education: Not on file  . Highest education level: Not on file  Occupational History  . Not on file  Tobacco Use  . Smoking status: Former Smoker    Packs/day: 1.00    Years: 15.00    Pack years: 15.00    Quit date: 01/26/1971    Years since quitting: 48.7  . Smokeless tobacco: Never Used  Substance and Sexual Activity  . Alcohol use: No  . Drug use: No  . Sexual activity: Not on file  Other Topics Concern  . Not on file  Social History Narrative  . Not on file   Social Determinants of Health   Financial Resource Strain:   . Difficulty of Paying Living Expenses:   Food Insecurity:   . Worried About Charity fundraiser in the Last Year:   . Arboriculturist in the Last Year:   Transportation Needs:   . Film/video editor  (Medical):   Marland Kitchen Lack of Transportation (Non-Medical):   Physical Activity:   . Days of Exercise per Week:   . Minutes of Exercise per Session:   Stress:   . Feeling of Stress :   Social Connections:   . Frequency of Communication with Friends and Family:   . Frequency of Social Gatherings with Friends and Family:   . Attends Religious Services:   . Active Member of Clubs or Organizations:   . Attends Archivist Meetings:   Marland Kitchen Marital Status:   Intimate Partner Violence:   . Fear of Current or Ex-Partner:   . Emotionally Abused:   Marland Kitchen Physically Abused:   . Sexually Abused:    Family History  Problem Relation Age of Onset  . CAD Father 63  . Cancer Mother   . Alzheimer's disease Mother   . Colon cancer Neg Hx     OBJECTIVE:  Vitals:   10/26/19 1003 10/26/19 1012  BP: 124/79   Pulse: 87   Resp: 18   Temp: 98.5 F (36.9 C)   TempSrc: Oral   SpO2: 93%   Weight:  210 lb (95.3 kg)  Height:  6\' 2"  (1.88 m)     General appearance: alert; appears mildly fatigued, but nontoxic; speaking in full sentences and tolerating own secretions HEENT: NCAT; Ears: EACs clear, TMs pearly gray; Eyes: PERRL.  EOM grossly intact. Sinuses: mildly TTP; Nose: nares patent without rhinorrhea, Throat: oropharynx clear, tonsils non erythematous or enlarged, uvula midline  Neck: supple without LAD Lungs: unlabored respirations, symmetrical air entry; cough: absent; no respiratory distress; CTAB Heart: regular rate and rhythm.  Skin: warm and dry Psychological: alert and cooperative; normal mood and affect  ASSESSMENT & PLAN:  1. Acute non-recurrent sinusitis, unspecified location   2. Suspected COVID-19 virus infection     Meds ordered this encounter  Medications  . DISCONTD: amoxicillin-clavulanate (AUGMENTIN) 500-125 MG tablet    Sig: Take 1 tablet (500 mg total) by mouth 2 (two) times daily for 10 days.    Dispense:  20 tablet    Refill:  0    Order Specific Question:    Supervising Provider    Answer:   Raylene Everts [[id number]]  . amoxicillin-clavulanate (AUGMENTIN) 500-125 MG tablet    Sig: Take 1 tablet (500 mg total) by mouth 2 (two) times daily for 10 days.    Dispense:  20 tablet    Refill:  0    Order Specific Question:   Supervising Provider    Answer:   Raylene Everts [[id number]]   COVID testing ordered.  It will take between 2-5 days for test results.  Someone will  contact you regarding abnormal results.    In the meantime: You should remain isolated in your home for 10 days from symptom onset AND greater than 72 hours after symptoms resolution (absence of fever without the use of fever-reducing medication and improvement in respiratory symptoms), whichever is longer Get plenty of rest and push fluids Augmentin prescribed for sinus infection.  Take as directed and to completion Use OTC zyrtec for nasal congestion, runny nose, and/or sore throat Use OTC flonase for nasal congestion and runny nose Use medications daily for symptom relief Use OTC medications like ibuprofen or tylenol as needed fever or pain Follow up with PCP later this week for recheck and to ensure your symptoms are improving Call or go to the ED if you have any new or worsening symptoms such as fever, cough, shortness of breath, chest tightness, chest pain, turning blue, changes in mental status, etc...   Reviewed expectations re: course of current medical issues. Questions answered. Outlined signs and symptoms indicating need for more acute intervention. Patient verbalized understanding. After Visit Summary given.         Lestine Box, PA-C 10/26/19 1023

## 2019-10-27 LAB — SARS-COV-2, NAA 2 DAY TAT

## 2019-10-27 LAB — NOVEL CORONAVIRUS, NAA: SARS-CoV-2, NAA: DETECTED — AB

## 2019-10-28 ENCOUNTER — Telehealth: Payer: Self-pay | Admitting: Nurse Practitioner

## 2019-10-28 ENCOUNTER — Telehealth: Payer: Self-pay | Admitting: Unknown Physician Specialty

## 2019-10-28 NOTE — Telephone Encounter (Signed)
Called to discuss with Kandra Nicolas about Covid symptoms and the use of  bamlanivimab/etesevimab or casirivimab/imdevimab, a combination monoclonal antibody infusion for those with mild to moderate Covid symptoms and at a high risk of hospitalization.     Pt is qualified for this infusion at the [facility] infusion center due to co-morbid conditions and/or a member of an at-risk group, however declines infusion at this time. Symptoms tier reviewed as well as criteria for ending isolation.    Per patient symptoms are improving and he is hopeful he continues to improve. Contact information available if he changes his mind. Last date he would be eligible for infusion is 10/30/19.   Patient Active Problem List   Diagnosis Date Noted  . Abnormal gastrointestinal PET scan 07/17/2018  . AKI (acute kidney injury) ([place]) 12/17/2017  . Hypertension   . Subdural hematoma ([place]) 10/12/2016  . A-fib ([place]) 10/12/2016  . Hypoxemia 05/13/2016  . Preventative health care 03/06/2016  . CLL (chronic lymphocytic leukemia) ([facility]) 03/09/2015    Alda Lea, AGPCNP-BC Pager: [phone] Amion: N. [place]

## 2019-10-28 NOTE — Telephone Encounter (Signed)
Called to discuss with patient about Covid symptoms and the use of bamlanivimab, a monoclonal antibody infusion for those with mild to moderate Covid symptoms and at a high risk of hospitalization.  Pt is qualified for this infusion at the [facility] infusion center due to Age > 65   Message left to call back  

## 2019-11-06 DIAGNOSIS — N200 Calculus of kidney: Secondary | ICD-10-CM | POA: Diagnosis not present

## 2019-11-06 DIAGNOSIS — D696 Thrombocytopenia, unspecified: Secondary | ICD-10-CM | POA: Diagnosis not present

## 2019-11-06 DIAGNOSIS — N179 Acute kidney failure, unspecified: Secondary | ICD-10-CM | POA: Diagnosis not present

## 2019-11-06 DIAGNOSIS — N12 Tubulo-interstitial nephritis, not specified as acute or chronic: Secondary | ICD-10-CM | POA: Diagnosis not present

## 2019-11-06 DIAGNOSIS — N184 Chronic kidney disease, stage 4 (severe): Secondary | ICD-10-CM | POA: Diagnosis not present

## 2019-11-06 DIAGNOSIS — Z8616 Personal history of COVID-19: Secondary | ICD-10-CM | POA: Diagnosis not present

## 2019-11-06 DIAGNOSIS — I129 Hypertensive chronic kidney disease with stage 1 through stage 4 chronic kidney disease, or unspecified chronic kidney disease: Secondary | ICD-10-CM | POA: Diagnosis not present

## 2019-11-06 DIAGNOSIS — C9110 Chronic lymphocytic leukemia of B-cell type not having achieved remission: Secondary | ICD-10-CM | POA: Diagnosis not present

## 2019-11-10 ENCOUNTER — Inpatient Hospital Stay (HOSPITAL_COMMUNITY): Payer: Medicare HMO | Attending: Hematology

## 2019-11-10 DIAGNOSIS — I480 Paroxysmal atrial fibrillation: Secondary | ICD-10-CM | POA: Diagnosis not present

## 2019-11-10 DIAGNOSIS — Z8249 Family history of ischemic heart disease and other diseases of the circulatory system: Secondary | ICD-10-CM | POA: Diagnosis not present

## 2019-11-10 DIAGNOSIS — I2510 Atherosclerotic heart disease of native coronary artery without angina pectoris: Secondary | ICD-10-CM | POA: Insufficient documentation

## 2019-11-10 DIAGNOSIS — N184 Chronic kidney disease, stage 4 (severe): Secondary | ICD-10-CM | POA: Insufficient documentation

## 2019-11-10 DIAGNOSIS — Z79899 Other long term (current) drug therapy: Secondary | ICD-10-CM | POA: Diagnosis not present

## 2019-11-10 DIAGNOSIS — Z87891 Personal history of nicotine dependence: Secondary | ICD-10-CM | POA: Diagnosis not present

## 2019-11-10 DIAGNOSIS — C9110 Chronic lymphocytic leukemia of B-cell type not having achieved remission: Secondary | ICD-10-CM | POA: Insufficient documentation

## 2019-11-10 DIAGNOSIS — D696 Thrombocytopenia, unspecified: Secondary | ICD-10-CM | POA: Insufficient documentation

## 2019-11-10 DIAGNOSIS — I10 Essential (primary) hypertension: Secondary | ICD-10-CM | POA: Diagnosis not present

## 2019-11-10 LAB — CBC WITH DIFFERENTIAL/PLATELET
Abs Immature Granulocytes: 0.12 10*3/uL — ABNORMAL HIGH (ref 0.00–0.07)
Basophils Absolute: 0.1 10*3/uL (ref 0.0–0.1)
Basophils Relative: 0 %
Eosinophils Absolute: 0.2 10*3/uL (ref 0.0–0.5)
Eosinophils Relative: 0 %
HCT: 33.3 % — ABNORMAL LOW (ref 39.0–52.0)
Hemoglobin: 10.5 g/dL — ABNORMAL LOW (ref 13.0–17.0)
Immature Granulocytes: 0 %
Lymphocytes Relative: 86 %
Lymphs Abs: 37.1 10*3/uL — ABNORMAL HIGH (ref 0.7–4.0)
MCH: 31.6 pg (ref 26.0–34.0)
MCHC: 31.5 g/dL (ref 30.0–36.0)
MCV: 100.3 fL — ABNORMAL HIGH (ref 80.0–100.0)
Monocytes Absolute: 1.2 10*3/uL — ABNORMAL HIGH (ref 0.1–1.0)
Monocytes Relative: 3 %
Neutro Abs: 4.7 10*3/uL (ref 1.7–7.7)
Neutrophils Relative %: 11 %
Platelets: 107 10*3/uL — ABNORMAL LOW (ref 150–400)
RBC: 3.32 MIL/uL — ABNORMAL LOW (ref 4.22–5.81)
RDW: 13.8 % (ref 11.5–15.5)
WBC: 43.4 10*3/uL — ABNORMAL HIGH (ref 4.0–10.5)
nRBC: 0 % (ref 0.0–0.2)

## 2019-11-10 LAB — COMPREHENSIVE METABOLIC PANEL
ALT: 13 U/L (ref 0–44)
AST: 16 U/L (ref 15–41)
Albumin: 4 g/dL (ref 3.5–5.0)
Alkaline Phosphatase: 43 U/L (ref 38–126)
Anion gap: 11 (ref 5–15)
BUN: 54 mg/dL — ABNORMAL HIGH (ref 8–23)
CO2: 19 mmol/L — ABNORMAL LOW (ref 22–32)
Calcium: 9.1 mg/dL (ref 8.9–10.3)
Chloride: 110 mmol/L (ref 98–111)
Creatinine, Ser: 5.23 mg/dL — ABNORMAL HIGH (ref 0.61–1.24)
GFR calc Af Amer: 11 mL/min — ABNORMAL LOW (ref >60)
GFR calc non Af Amer: 10 mL/min — ABNORMAL LOW (ref >60)
Glucose, Bld: 123 mg/dL — ABNORMAL HIGH (ref 70–99)
Potassium: 3.9 mmol/L (ref 3.5–5.1)
Sodium: 140 mmol/L (ref 135–145)
Total Bilirubin: 0.6 mg/dL (ref 0.3–1.2)
Total Protein: 6.4 g/dL — ABNORMAL LOW (ref 6.5–8.1)

## 2019-11-10 LAB — LACTATE DEHYDROGENASE: LDH: 110 U/L (ref 98–192)

## 2019-11-13 ENCOUNTER — Other Ambulatory Visit: Payer: Self-pay

## 2019-11-13 ENCOUNTER — Inpatient Hospital Stay (HOSPITAL_COMMUNITY): Payer: Medicare HMO | Admitting: Hematology

## 2019-11-13 ENCOUNTER — Encounter (HOSPITAL_COMMUNITY): Payer: Self-pay | Admitting: Hematology

## 2019-11-13 ENCOUNTER — Inpatient Hospital Stay (HOSPITAL_COMMUNITY): Payer: Medicare HMO

## 2019-11-13 VITALS — BP 112/69 | HR 68 | Temp 96.8°F | Resp 18 | Wt 212.6 lb

## 2019-11-13 DIAGNOSIS — I10 Essential (primary) hypertension: Secondary | ICD-10-CM | POA: Diagnosis not present

## 2019-11-13 DIAGNOSIS — N184 Chronic kidney disease, stage 4 (severe): Secondary | ICD-10-CM | POA: Diagnosis not present

## 2019-11-13 DIAGNOSIS — Z7189 Other specified counseling: Secondary | ICD-10-CM | POA: Diagnosis not present

## 2019-11-13 DIAGNOSIS — C9110 Chronic lymphocytic leukemia of B-cell type not having achieved remission: Secondary | ICD-10-CM

## 2019-11-13 DIAGNOSIS — Z79899 Other long term (current) drug therapy: Secondary | ICD-10-CM | POA: Diagnosis not present

## 2019-11-13 DIAGNOSIS — Z8249 Family history of ischemic heart disease and other diseases of the circulatory system: Secondary | ICD-10-CM | POA: Diagnosis not present

## 2019-11-13 DIAGNOSIS — I2510 Atherosclerotic heart disease of native coronary artery without angina pectoris: Secondary | ICD-10-CM | POA: Diagnosis not present

## 2019-11-13 DIAGNOSIS — Z87891 Personal history of nicotine dependence: Secondary | ICD-10-CM | POA: Diagnosis not present

## 2019-11-13 DIAGNOSIS — I480 Paroxysmal atrial fibrillation: Secondary | ICD-10-CM | POA: Diagnosis not present

## 2019-11-13 DIAGNOSIS — D696 Thrombocytopenia, unspecified: Secondary | ICD-10-CM | POA: Diagnosis not present

## 2019-11-13 LAB — LACTATE DEHYDROGENASE: LDH: 108 U/L (ref 98–192)

## 2019-11-13 LAB — VITAMIN B12: Vitamin B-12: 848 pg/mL (ref 180–914)

## 2019-11-13 LAB — DIRECT ANTIGLOBULIN TEST (NOT AT ARMC)
DAT, IgG: NEGATIVE
DAT, complement: NEGATIVE

## 2019-11-13 LAB — RETICULOCYTES
Immature Retic Fract: 17.6 % — ABNORMAL HIGH (ref 2.3–15.9)
RBC.: 3.34 MIL/uL — ABNORMAL LOW (ref 4.22–5.81)
Retic Count, Absolute: 59.5 10*3/uL (ref 19.0–186.0)
Retic Ct Pct: 1.8 % (ref 0.4–3.1)

## 2019-11-13 LAB — HEPATITIS B SURFACE ANTIBODY,QUALITATIVE: Hep B S Ab: NONREACTIVE

## 2019-11-13 LAB — IRON AND TIBC
Iron: 81 ug/dL (ref 45–182)
Saturation Ratios: 28 % (ref 17.9–39.5)
TIBC: 290 ug/dL (ref 250–450)
UIBC: 209 ug/dL

## 2019-11-13 LAB — HEPATITIS B SURFACE ANTIGEN: Hepatitis B Surface Ag: NONREACTIVE

## 2019-11-13 LAB — FERRITIN: Ferritin: 132 ng/mL (ref 24–336)

## 2019-11-13 LAB — HEPATITIS C ANTIBODY: HCV Ab: NONREACTIVE

## 2019-11-13 LAB — FOLATE: Folate: 19 ng/mL (ref >5.9)

## 2019-11-13 MED ORDER — VENETOCLAX 10 & 50 & 100 MG PO TBPK: ORAL_TABLET | ORAL | 0 refills | Status: DC

## 2019-11-13 MED ORDER — ALLOPURINOL 300 MG PO TABS
300.0000 mg | ORAL_TABLET | Freq: Every day | ORAL | 1 refills | Status: DC
Start: 2019-11-13 — End: 2020-01-14

## 2019-11-13 NOTE — Progress Notes (Signed)
Andrew [name] [address], [place] [address]   CLINIC:  Medical Oncology/Hematology  PCP:  [name], MD [address] [place] [address]  [phone]  REASON FOR VISIT:  Follow-up for CLL  PRIOR THERAPY: 1. Imbruvica 420 mg from 08/19/2016 through 09/2016. 2. Chlorambucil 30 mg from 10/2017 through 11/07/2018.   CURRENT THERAPY: Observation  INTERVAL HISTORY:  Andrew [name], a 79 y.o. male, returns for routine follow-up for his CLL. [name] was last seen on 08/11/2019.  Today he reports doing well. He denies F/C/night sweats, but he lost 10 pounds accompanied by low-grade fever and sweats after contracting COVID after vaccination; second vaccine on 07/12/2019 and sick on 10/26/2019. His taste and smell have still not returned.  He is receptive to receiving IV chemotherapy this round. He has never had hepatitis or blood transfusions.   REVIEW OF SYSTEMS:  Review of Systems  Constitutional: Positive for appetite change (moderately decreased) and fatigue (moderate). Negative for chills, diaphoresis and fever.  Neurological: Positive for numbness (neck).  Psychiatric/Behavioral: Positive for sleep disturbance.  All other systems reviewed and are negative.   PAST MEDICAL/SURGICAL HISTORY:  Past Medical History:  Diagnosis Date  . CKD (chronic kidney disease), stage IV ([place])   . CLL (chronic lymphocytic leukemia) ([place])   . Hypertension   . NICM (nonischemic cardiomyopathy) ([place])    a. cath 2007, EF 35%, minimal CAD. b. EF normal 2018.  Marland Kitchen PAF (paroxysmal atrial fibrillation) ([place])   . Pinched nerve in neck   . SDH (subdural hematoma) ([place])   . Thrombocytopenia ([place])    Past Surgical History:  Procedure Laterality Date  . BIOPSY  08/05/2018   Procedure: BIOPSY;  Surgeon: Danie Binder, MD;  [facility];  Service: Endoscopy;;  gastric   . CATARACT EXTRACTION, BILATERAL    . COLONOSCOPY  07/10/2011   Procedure:  COLONOSCOPY;  Surgeon: Dorothyann Peng, MD;  [facility];  Service: Endoscopy;  Laterality: N/A;  10:30 AM  . CRANIOTOMY N/A 10/14/2016   Procedure: CRANIOTOMY HEMATOMA EVACUATION SUBDURAL;  Surgeon: Jovita Gamma, MD;  Location: [facility];  Service: Neurosurgery;  Laterality: N/A;  CRANIOTOMY HEMATOMA EVACUATION SUBDURAL  . ESOPHAGOGASTRODUODENOSCOPY N/A 08/05/2018   Procedure: ESOPHAGOGASTRODUODENOSCOPY (EGD);  Surgeon: Danie Binder, MD;  [facility];  Service: Endoscopy;  Laterality: N/A;  3:00pm  . THYROIDECTOMY, PARTIAL    . TONSILLECTOMY      SOCIAL HISTORY:  Social History   Socioeconomic History  . Marital status: Married    Spouse name: Not on file  . Number of children: Not on file  . Years of education: Not on file  . Highest education level: Not on file  Occupational History  . Not on file  Tobacco Use  . Smoking status: Former Smoker    Packs/day: 1.00    Years: 15.00    Pack years: 15.00    Quit date: 01/26/1971    Years since quitting: 48.8  . Smokeless tobacco: Never Used  Vaping Use  . Vaping Use: Never used  Substance and Sexual Activity  . Alcohol use: No  . Drug use: No  . Sexual activity: Not on file  Other Topics Concern  . Not on file  Social History Narrative  . Not on file   Social Determinants of Health   Financial Resource Strain:   . Difficulty of Paying Living Expenses:   Food Insecurity:   .  Worried About Charity fundraiser in the Last Year:   . Arboriculturist in the Last Year:   Transportation Needs:   . Film/video editor (Medical):   Marland Kitchen Lack of Transportation (Non-Medical):   Physical Activity:   . Days of Exercise per Week:   . Minutes of Exercise per Session:   Stress:   . Feeling of Stress :   Social Connections:   . Frequency of Communication with Friends and Family:   . Frequency of Social Gatherings with Friends and Family:   . Attends Religious Services:   . Active Member of Clubs or  Organizations:   . Attends Archivist Meetings:   Marland Kitchen Marital Status:   Intimate Partner Violence:   . Fear of Current or Ex-Partner:   . Emotionally Abused:   Marland Kitchen Physically Abused:   . Sexually Abused:     FAMILY HISTORY:  Family History  Problem Relation Age of Onset  . CAD Father 60  . Cancer Mother   . Alzheimer's disease Mother   . Colon cancer Neg Hx     CURRENT MEDICATIONS:  Current Outpatient Medications  Medication Sig Dispense Refill  . carvedilol (COREG) 6.25 MG tablet Take 1 tablet (6.25 mg total) by mouth 2 (two) times daily. 180 tablet 3  . Coenzyme Q10 (COQ-10) 200 MG CAPS Take 200 mg by mouth 2 (two) times daily.     . Cyanocobalamin (VITAMIN B12) 1000 MCG TBCR Take 1,000 mcg by mouth daily.     . hydrALAZINE (APRESOLINE) 25 MG tablet Take 25 mg by mouth 2 (two) times daily.    . Multiple Vitamin (MULITIVITAMIN WITH MINERALS) TABS Take 1 tablet by mouth daily.    Marland Kitchen omeprazole (PRILOSEC) 20 MG capsule 1 PO 30 MINS PRIOR TO BREAKFAST. 90 capsule 3  . sodium bicarbonate 650 MG tablet Take 1 tablet (650 mg total) by mouth 2 (two) times daily. 60 tablet 0  . tamsulosin (FLOMAX) 0.4 MG CAPS capsule Take 0.4 mg by mouth daily.     . diphenhydramine-acetaminophen (TYLENOL PM) 25-500 MG TABS tablet Take 1 tablet by mouth at bedtime as needed (sleep).  (Patient not taking: Reported on 11/13/2019)     No current facility-administered medications for this visit.    ALLERGIES:  No Known Allergies  PHYSICAL EXAM:  Performance status (ECOG): 1 - Symptomatic but completely ambulatory  Vitals:   11/13/19 0806  BP: 112/69  Pulse: 68  Resp: 18  Temp: (!) 96.8 F (36 C)  SpO2: 98%   Wt Readings from Last 3 Encounters:  11/13/19 212 lb 9.6 oz (96.4 kg)  10/26/19 210 lb (95.3 kg)  08/11/19 224 lb 8 oz (101.8 kg)   Physical Exam Vitals reviewed.  Constitutional:      Appearance: Normal appearance.  Cardiovascular:     Rate and Rhythm: Normal rate and regular  rhythm.     Pulses: Normal pulses.     Heart sounds: Normal heart sounds.  Pulmonary:     Effort: Pulmonary effort is normal.     Breath sounds: Normal breath sounds.  Abdominal:     Palpations: Abdomen is soft. There is no hepatomegaly, splenomegaly or mass.     Tenderness: There is no abdominal tenderness.     Hernia: No hernia is present.  Lymphadenopathy:     Cervical: No cervical adenopathy.     Upper Body:     Right upper body: No supraclavicular or axillary adenopathy.  Left upper body: No supraclavicular or axillary adenopathy.     Lower Body: No right inguinal adenopathy. No left inguinal adenopathy.  Neurological:     General: No focal deficit present.     Mental Status: He is alert and oriented to person, place, and time.  Psychiatric:        Mood and Affect: Mood normal.        Behavior: Behavior normal.     LABORATORY DATA:  I have reviewed the labs as listed.  CBC Latest Ref Rng & Units 11/10/2019 07/17/2019 04/07/2019  WBC 4.0 - 10.5 K/uL 43.4(H) 20.0(H) 10.5  Hemoglobin 13.0 - 17.0 g/dL 10.5(L) 12.9(L) 13.0  Hematocrit 39 - 52 % 33.3(L) 39.0 39.0  Platelets 150 - 400 K/uL 107(L) 66(L) 68(L)   CMP Latest Ref Rng & Units 11/10/2019 07/17/2019 04/07/2019  Glucose 70 - 99 mg/dL 123(H) 95 117(H)  BUN 8 - 23 mg/dL 54(H) 49(H) 47(H)  Creatinine 0.61 - 1.24 mg/dL 5.23(H) 4.01(H) 2.98(H)  Sodium 135 - 145 mmol/L 140 140 137  Potassium 3.5 - 5.1 mmol/L 3.9 4.0 4.0  Chloride 98 - 111 mmol/L 110 107 109  CO2 22 - 32 mmol/L 19(L) 21(L) 21(L)  Calcium 8.9 - 10.3 mg/dL 9.1 9.3 9.1  Total Protein 6.5 - 8.1 g/dL 6.4(L) 6.5 6.4(L)  Total Bilirubin 0.3 - 1.2 mg/dL 0.6 1.1 0.8  Alkaline Phos 38 - 126 U/L 43 54 51  AST 15 - 41 U/L 16 18 19   ALT 0 - 44 U/L 13 14 14       Component Value Date/Time   RBC 3.32 (L) 11/10/2019 0912   MCV 100.3 (H) 11/10/2019 0912   MCH 31.6 11/10/2019 0912   MCHC 31.5 11/10/2019 0912   RDW 13.8 11/10/2019 0912   LYMPHSABS 37.1 (H) 11/10/2019  0912   MONOABS 1.2 (H) 11/10/2019 0912   EOSABS 0.2 11/10/2019 0912   BASOSABS 0.1 11/10/2019 0912   Lab Results  Component Value Date   LDH 110 11/10/2019   LDH 111 07/17/2019   LDH 115 04/07/2019    DIAGNOSTIC IMAGING:  I have independently reviewed the scans and discussed with the patient. No results found.   ASSESSMENT:  1.  CLL, Rai stage IV: -BMBX on 08/10/2016 shows bone marrow involvement by CLL. -He also had bulky diffuse adenopathy with progressive thrombocytopenia. -Imbruvica 420 mg from 08/19/2016 through May 2018, discontinued due to subdural hematoma on 10/14/2016. -Subsequently offered chlorambucil and obinutuzumab.  He refused obinutuzumab.  Chlorambucil 30 mg every 2 weeks from June 2019 through 11/07/2018 due to severe weakness.  2.  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    PLAN:  1.  CLL, Rai stage IV: -He denies any fevers, night sweats or weight loss. -Recently evaluated by nephrology and his worsening kidney function was thought to be secondary to CLL. -I have recommended institution of treatment.  He is not a candidate for V. tach any better secondary to subdural hematoma. -The next best option would be venetoclax and rituximab.  We talked about side effects and he is agreeable. -I will obtain CLL FISH panel, TP 53 mutation analysis, I GVH mutation analysis.  We will check for hepatitis serology. -I will also obtain baseline PET CT scan. -We will likely initiate him on treatment after the PET scan with ramp-up dose of venetoclax.  I have sent a prescription to [facility] outpatient pharmacy. -Rituximab will start after  week 5 of venetoclax.  2.  CKD: -Creatinine on 11/10/2019 was 5.23.  This has gradually gone up from September last year.  3.  Tumor lysis prophylaxis: -He is at high risk for tumor lysis because of his elevated lymphocyte  count and CKD. -I will start him on allopurinol 300 mg daily. -I plan to give him rasburicase at the time of start of venetoclax.  4.  Macrocytic anemia: -Recent drop in hemoglobin to 10.5 from 12.9 in February.  MCV was 100.3. -We will check for reticulocyte count, LDH, direct Coombs test for hemolysis.  We will also check for ferritin, iron panel, F35 and folic acid.   Orders placed this encounter:  No orders of the defined types were placed in this encounter.  Total time spent is 40 minutes with more than 50% of the time spent face-to-face discussing treatment plan, adverse effects, counseling and coordination of care.  Derek Jack, MD [place] [phone]   I, Milinda Antis, am acting as a scribe for Dr. Sanda Linger.  I, Derek Jack MD, have reviewed the above documentation for accuracy and completeness, and I agree with the above.

## 2019-11-13 NOTE — Progress Notes (Signed)
START ON PATHWAY REGIMEN - Lymphoma and CLL     Ramp-up cycle = 35 days:     Venetoclax      Venetoclax      Venetoclax      Venetoclax      Venetoclax    Cycle 1 through 24 = every 28 days:     Venetoclax      Rituximab-xxxx      Rituximab-xxxx   **Always confirm dose/schedule in your pharmacy ordering system**  Patient Characteristics: Chronic Lymphocytic Leukemia (CLL), Second Line Disease Type: Chronic Lymphocytic Leukemia (CLL) Disease Type: Not Applicable Disease Type: Not Applicable Line of Therapy: Second Line Rai Stage: IV Intent of Therapy: Non-Curative / Palliative Intent, Discussed with Patient

## 2019-11-13 NOTE — Patient Instructions (Signed)
[place] at [facility] Discharge Instructions  You were seen today by Dr. Delton Coombes. He went over your recent results. The possible treatments and side effects, including tumor lysis syndrome, were discussed today. You will be scheduled for a PET scan and have your blood drawn for analysis. You will be prescribed allopurinol for your kidneys. Continue your routine care with your primary care provider. Dr. Delton Coombes will see you back after your scan for labs and follow up.   Thank you for choosing [place] at [facility] to provide your oncology and hematology care.  To afford each patient quality time with our provider, please arrive at least 15 minutes before your scheduled appointment time.   If you have a lab appointment with the [facility] please come in thru the Main Entrance and check in at the main information desk  You need to re-schedule your appointment should you arrive 10 or more minutes late.  We strive to give you quality time with our providers, and arriving late affects you and other patients whose appointments are after yours.  Also, if you no show three or more times for appointments you may be dismissed from the clinic at the providers discretion.     Again, thank you for choosing [facility].  Our hope is that these requests will decrease the amount of time that you wait before being seen by our physicians.       _____________________________________________________________  Should you have questions after your visit to [facility], please contact our office at [phone] between the hours of 8:00 a.m. and 4:30 p.m.  Voicemails left after 4:00 p.m. will not be returned until the following business day.  For prescription refill requests, have your pharmacy contact our office and allow 72 hours.    Cancer Center Support Programs:   > Cancer Support Group  2nd Tuesday of the month 1pm-2pm,  Jour[facility] Room

## 2019-11-14 ENCOUNTER — Telehealth (HOSPITAL_COMMUNITY): Payer: Self-pay | Admitting: Pharmacy Technician

## 2019-11-14 ENCOUNTER — Telehealth: Payer: Self-pay | Admitting: Pharmacist

## 2019-11-14 NOTE — Telephone Encounter (Signed)
Oral Oncology Pharmacist Encounter  Received new prescription for Venclexta (venetoclax) for the treatment of CLL in conjunction with rituximab. Planned venetoclax start after his PET scheduled for 12/01/19. Rituxamab will be started after 5 week venetoclax ramp-up.  CBC/CMP from 11/10/19 assessed, SCr high at 5.23mg /dL,CrCl ~64mL/min, ALC 37.1 K/uL. Given the poor renal function and elevated ALC, I would recommend admitting the patient for venetoclax initiation, will discuss with MD. Prescription dose and frequency assessed. Prescription for allopurinol entered. MD plans to give him rasburicase at the time of venetoclax start.  Current medication list in Epic reviewed, one DDIs with venetoclax identified: -Carvedilol: Carvedilol may increase the concentration of venetoclax. The first  recommendation is to avoid the combination. If unavoidable then the recommendation is to reduce the venetoclax dosage by at least 50% and closely monitor patients for venetoclax toxicity.  Prescription has been e-scribed to the [facility] for benefits analysis and approval.  Oral Oncology Clinic will continue to follow for insurance authorization, copayment issues, initial counseling and start date.  Darl Pikes, PharmD, BCPS, BCOP, CPP Hematology/Oncology Clinical Pharmacist Practitioner [facility] [phone]  11/14/2019 2:34 PM

## 2019-11-14 NOTE — Telephone Encounter (Signed)
Oral Oncology Patient Advocate Encounter  Received notification from [facility] that prior authorization for Venclexta is required.  PA submitted on CoverMyMeds Key [id number] Status is pending  Oral Oncology Clinic will continue to follow.  [place] Patient [place] Phone [phone] Fax [phone] 11/14/2019 3:12 PM

## 2019-11-14 NOTE — Telephone Encounter (Signed)
Oral Oncology Patient Advocate Encounter  Prior Authorization for Lynita Lombard has been approved.    PA# [id number] Effective dates: 05/30/19 through 05/28/20  Patients co-pay is $987.75.  Will seek copay assistance to reduce out of pocket cost.  Oral Oncology Clinic will continue to follow.   [place] Patient [place] Phone [phone] Fax [phone] 11/14/2019 3:24 PM

## 2019-11-17 ENCOUNTER — Ambulatory Visit (HOSPITAL_COMMUNITY): Payer: Medicare HMO | Admitting: Hematology

## 2019-11-20 ENCOUNTER — Encounter (HOSPITAL_COMMUNITY): Payer: Self-pay

## 2019-11-20 ENCOUNTER — Other Ambulatory Visit: Payer: Self-pay

## 2019-11-20 ENCOUNTER — Ambulatory Visit: Payer: Medicare HMO | Admitting: Cardiology

## 2019-11-20 ENCOUNTER — Encounter: Payer: Self-pay | Admitting: Cardiology

## 2019-11-20 ENCOUNTER — Telehealth (HOSPITAL_COMMUNITY): Payer: Self-pay | Admitting: Pharmacy Technician

## 2019-11-20 VITALS — BP 100/62 | HR 69 | Ht 74.0 in | Wt 215.4 lb

## 2019-11-20 DIAGNOSIS — I10 Essential (primary) hypertension: Secondary | ICD-10-CM | POA: Diagnosis not present

## 2019-11-20 DIAGNOSIS — I480 Paroxysmal atrial fibrillation: Secondary | ICD-10-CM

## 2019-11-20 NOTE — Patient Instructions (Signed)
Medication Instructions:  Your physician recommends that you continue on your current medications as directed. Please refer to the Current Medication list given to you today.  *If you need a refill on your cardiac medications before your next appointment, please call your pharmacy*   Lab Work: None today If you have labs (blood work) drawn today and your tests are completely normal, you will receive your results only by: . MyChart Message (if you have MyChart) OR . A paper copy in the mail If you have any lab test that is abnormal or we need to change your treatment, we will call you to review the results.   Testing/Procedures: None today   Follow-Up: At [facility], you and your health needs are our priority.  As part of our continuing mission to provide you with exceptional heart care, we have created designated Provider Care Teams.  These Care Teams include your primary Cardiologist (physician) and Advanced Practice Providers (APPs -  Physician Assistants and Nurse Practitioners) who all work together to provide you with the care you need, when you need it.  We recommend signing up for the patient portal called "MyChart".  Sign up information is provided on this After Visit Summary.  MyChart is used to connect with patients for Virtual Visits (Telemedicine).  Patients are able to view lab/test results, encounter notes, upcoming appointments, etc.  Non-urgent messages can be sent to your provider as well.   To learn more about what you can do with MyChart, go to [email or web address].com.    Your next appointment:   6 month(s)  The format for your next appointment:   In Person  Provider:   Jonathan Branch, MD   Other Instructions None       Thank you for choosing  [facility] !         

## 2019-11-20 NOTE — Progress Notes (Signed)
Clinical Summary Andrew [name] is a 79 y.o.male seen today for follow up of the following medical problems.  1. PAF - remote DCCV 15 years ago - per neurosurgery not a candidate for at least 2 months for antiplatelets or anticoag CHA2DS2 VASc score is at least 3, however given his subdural hematoma, hehasnotbeena candidate for anticoagulation   - no recent palpitations - compliant with meds  2. 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 Thrombocytopenia - followed by heme   5. HTN - he is compliant with meds   6.[facility] of [facility] -he reports being told several year ago that his LVEF was 35%, this was around the time of his first diagnosis of afib. - 2018 echo with normal LVEF 55-60%.  - no recent symptoms   7. 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 COVID + - + covid test 10/26/19 - mild to moderate symptoms of fever, congestion, lost of tasted/appetite. 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 cath 2007, EF 35%, minimal CAD. b. EF normal 2018.   PAF (paroxysmal atrial fibrillation) (HCC)    Pinched nerve in neck    SDH (subdural hematoma) (HCC)    Thrombocytopenia (HCC)      No Known Allergies   Current Outpatient Medications  Medication Sig Dispense Refill   allopurinol (ZYLOPRIM) 300 MG tablet Take 1 tablet (300 mg total) by mouth daily.  30 tablet 1   carvedilol (COREG) 6.25 MG tablet Take 1 tablet (6.25 mg total) by mouth 2 (two) times daily. 180 tablet 3   Coenzyme Q10 (COQ-10) 200 MG CAPS Take 200 mg by mouth 2 (two) times daily.      Cyanocobalamin (VITAMIN B12) 1000 MCG TBCR Take 1,000 mcg by mouth daily.      diphenhydramine-acetaminophen (TYLENOL PM) 25-500 MG TABS tablet Take 1 tablet by mouth at bedtime as needed (sleep).  (Patient not taking: Reported on 11/13/2019)     hydrALAZINE (APRESOLINE) 25 MG tablet Take 25 mg by mouth 2 (two) times daily.     Multiple Vitamin (MULITIVITAMIN WITH MINERALS) TABS Take 1 tablet by mouth daily.     omeprazole (PRILOSEC) 20 MG capsule 1 PO 30 MINS PRIOR TO BREAKFAST. 90 capsule 3   sodium bicarbonate 650 MG tablet Take 1 tablet (650 mg total) by mouth 2 (two) times daily. 60 tablet 0   tamsulosin (FLOMAX) 0.4 MG CAPS capsule Take 0.4 mg by mouth daily.      venetoclax 10 & 50 & 100 MG TBPK Take 20 mg once daily for 7 days followed by 50 mg once daily for 7 days followed by 100 mg once daily for 7 days, followed by 200 mg daily for 7 days. 42 tablet 0   No current facility-administered medications for this visit.     Past Surgical History:  Procedure Laterality Date  BIOPSY  08/05/2018   Procedure: BIOPSY;  Surgeon: Danie Binder, MD;  [facility];  Service: Endoscopy;;  gastric    CATARACT EXTRACTION, BILATERAL     COLONOSCOPY  07/10/2011   Procedure: COLONOSCOPY;  Surgeon: Dorothyann Peng, MD;  [facility];  Service: Endoscopy;  Laterality: N/A;  10:30 AM   CRANIOTOMY N/A 10/14/2016   Procedure: CRANIOTOMY HEMATOMA EVACUATION SUBDURAL;  Surgeon: Jovita Gamma, MD;  Location: [place];  Service: Neurosurgery;  Laterality: N/A;  CRANIOTOMY HEMATOMA EVACUATION SUBDURAL   ESOPHAGOGASTRODUODENOSCOPY N/A 08/05/2018   Procedure: ESOPHAGOGASTRODUODENOSCOPY (EGD);  Surgeon: Danie Binder, MD;  [facility];  Service: Endoscopy;   Laterality: N/A;  3:00pm   THYROIDECTOMY, PARTIAL     TONSILLECTOMY       No Known Allergies    Family History  Problem Relation Age of Onset   CAD Father 37   Cancer Mother    Alzheimer's disease Mother    Colon cancer Neg Hx      Social History Mr. Heigl reports that he quit smoking about 48 years ago. He has a 15.00 pack-year smoking history. He has never used smokeless tobacco. Mr. Cretella reports no history of alcohol use.   Review of Systems CONSTITUTIONAL: No weight loss, fever, chills, weakness or fatigue.  HEENT: Eyes: No visual loss, blurred vision, double vision or yellow sclerae.No hearing loss, sneezing, congestion, runny nose or sore throat.  SKIN: No rash or itching.  CARDIOVASCULAR: per hpi RESPIRATORY: No shortness of breath, cough or sputum.  GASTROINTESTINAL: No anorexia, nausea, vomiting or diarrhea. No abdominal pain or blood.  GENITOURINARY: No burning on urination, no polyuria NEUROLOGICAL: No headache, dizziness, syncope, paralysis, ataxia, numbness or tingling in the extremities. No change in bowel or bladder control.  MUSCULOSKELETAL: No muscle, back pain, joint pain or stiffness.  LYMPHATICS: No enlarged nodes. No history of splenectomy.  PSYCHIATRIC: No history of depression or anxiety.  ENDOCRINOLOGIC: No reports of sweating, cold or heat intolerance. No polyuria or polydipsia.  Marland Kitchen   Physical Examination Today's Vitals   11/20/19 0809  BP: 100/62  Pulse: 69  SpO2: 94%  Weight: 215 lb 6.4 oz (97.7 kg)  Height: 6\' 2"  (1.88 m)   Body mass index is 27.66 kg/m.  Gen: resting comfortably, no acute distress HEENT: no scleral icterus, pupils equal round and reactive, no palptable cervical adenopathy,  CV: irreg, no m/r/g, no jvd Resp: Clear to auscultation bilaterally GI: abdomen is soft, non-tender, non-distended, normal bowel sounds, no hepatosplenomegaly MSK: extremities are warm, no edema.  Skin: warm, no rash Neuro:  no focal  deficits Psych: appropriate affect   Diagnostic Studies  09/2016 echo Study Conclusions  - Left ventricle: The cavity size was normal. There was mild concentric hypertrophy. Systolic function was normal. The estimated ejection fraction was in the range of 55% to 60%. Wall motion was normal; there were no regional wall motion abnormalities. - Aortic valve: There was trivial regurgitation. - Left atrium: The atrium was mildly dilated.   Assessment and Plan   1. PAF - not on anticoag due to chronic thrombocytopenia and prior SDH.  - no symptoms, continue current meds - EKG today shows rate controled afib   2. HTN -he is at goal, continue current meds  3. Aortic aneurysm - mild, noted by recent PET scan -has been stable, getting routine PET scans for his cancer that has also been used to monitor his aneurysm, upcominc scan per oncology  F/u 6 months     [name], M.D.

## 2019-11-20 NOTE — Telephone Encounter (Signed)
Oral Oncology Patient Advocate Encounter  Was successful in securing patient a $8,000 grant from [employer] to provide copayment coverage for Andrew [name].  This will keep the out of pocket expense at $0.     [employer] ID: [id number]  I have spoken with the patient.   The billing information is as follows and has been shared with [place].    RxBin: [id number] PCN: [id number] Member ID: [id number] Group ID: [id number] Dates of Eligibility: 10/21/19 through 10/19/20  Fund:  Chronic Lymphocytic [place] Patient [place] Phone [phone] Fax [phone] 11/20/2019 3:09 PM

## 2019-11-21 ENCOUNTER — Telehealth (HOSPITAL_COMMUNITY): Payer: Self-pay | Admitting: Pharmacist

## 2019-11-21 ENCOUNTER — Telehealth (HOSPITAL_COMMUNITY): Payer: Self-pay | Admitting: Pharmacy Technician

## 2019-11-21 LAB — IGVH SOMATIC HYPERMUTATION

## 2019-11-21 NOTE — Telephone Encounter (Signed)
Oral Chemotherapy Pharmacist Encounter  Andrew [name] knows to hold on starting his venetoclax until given the go ahead by Dr. Delton Coombes.  Patient Education I spoke with patient for overview of new oral chemotherapy medication: Venclexta (venetoclax) for the treatment of CLL in conjunction with rituximab. Planned venetoclax start after his PET scheduled for 12/01/19. Rituxamab will be started after 5 week venetoclax ramp-up.   Pt is doing well. Counseled patient on administration, dosing, side effects, monitoring, drug-food interactions, safe handling, storage, and disposal. Patient will take 20 mg once daily for 7 days followed by 50 mg once daily for 7 days followed by 100 mg once daily for 7 days, followed by 200 mg daily for 7 days.  Side effects include but not limited to: TLS, N/V, fatigue.    Reviewed with patient importance of keeping a medication schedule and plan for any missed doses.  Andrew [name] voiced understanding and appreciation. All questions answered. Medication handout placed in the mail.  Provided patient with [facility] phone number. Patient knows to call the office with questions or concerns. Oral Chemotherapy [facility] will continue to follow.  Darl Pikes, PharmD, BCPS, BCOP, CPP Hematology/Oncology Clinical Pharmacist Practitioner [facility] [phone]  11/21/2019 2:58 PM

## 2019-11-21 NOTE — Telephone Encounter (Signed)
Oral Oncology Patient Advocate Encounter   Was successful in securing patient an $103,700 grant from Patient [place] [facility]) to provide copayment coverage for Venclexta.  This will keep the out of pocket expense at $0.     I have spoken with the patient.    The billing information is as follows and has been shared with [place].   Member ID: [id number] Group ID: [id number] RxBin: [id number] Dates of Eligibility: 08/23/19 through 08/21/20  Fund:  [place] Patient [place] Phone [phone] Fax [phone] 11/21/2019 2:21 PM

## 2019-11-21 NOTE — Patient Instructions (Addendum)
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  You will be treated You will see the doctor regularly throughout treatment.  We monitor your lab work prior to every treatment. The doctor monitors your response to treatment by the way you are feeling, your blood work, and scans periodically.  There will be wait times while you are here for treatment.  It will take about 30 minutes to 1 hour for your lab work to result.  Then there will be wait times while pharmacy mixes your medications.     Rituximab (Generic Name) Other Name: Rituxan  About This Drug  Rituximab is a monoclonal antibody used to treat cancer. This drug is given in the vein (IV).  This first time this is given it will be infused slower to monitor for infusion reactions.    Possible Side Effects (More Common)  . Bone marrow depression. This is a decrease in the number of white blood cells, red blood cells, and platelets. This may raise your risk of infection, make you tired and weak (fatigue), and raise your risk of bleeding.  . Rash-skin irritation, redness or itching (dermatitis)  . Flu-like symptoms: fever, headache, muscle and joint aches, and fatigue (low energy, feeling weak)  . Infusion-related reactions  . Hepatitis B - if you have ever had hepatitis B, the virus may come back during treatment with this drug. Your doctor will test to see if you have ever had hepatitis B prior to your treatment.  . Changes in your central nervous system can happen. The central nervous system is made up of your brain and spinal cord. You could feel: extreme tiredness, agitation, confusion, or have: hallucinations (see or hear things that are not there), trouble understanding or speaking, loss of control of your bowels or bladder, eyesight changes, numbness or lack of strength to your arms, legs, face, or body, seizures or coma. If you start to have any of these  symptoms let your doctor know right away.  . Tumor lysis: This drug may act on the cancer cells very quickly. This may affect how your kidneys work. Your doctor will monitor your kidney function.  . Changes in your liver function. Your doctor will check your liver function as needed.  . Nausea and throwing up (vomiting): these symptoms may happen within a few hours after your treatment and may last up to 24 hours. Medicines are available to stop or lessen these side effects.  . Loose bowel movements (diarrhea) that may last for a few days  . Abdominal pain  . Infections  . Cough, runny nose  . Swelling of your legs, ankles and/or feet or hands  . High blood pressure. Your doctor will check your blood pressure as needed.  . Abnormal heart beat  Possible Side Effects (Less Common)  . Shortness of breath  . Soreness of the mouth and throat. You may have red areas, white patches, or sores that hurt.  Infusion Reactions  Infusion Reactions are the most common side effect linked to use of this drug and can be quite severe. Medicines will be given before you get the drug to lower the severity of this side effect. The infusion reactions are the worse with the first dose of the drug and become less severe with more doses of the drug. While you are getting this drug in your vein (IV), tell your nurse right away if you have any of these symptoms  of an allergic reaction:  . Trouble catching your breath  . Feeling like your tongue or throat are swelling  . Feeling your heart beat quickly or in a not normal way (palpitations)  . Feeling dizzy or lightheaded  . Flushing, itching, rash, and/or hives  Treating Side Effects  . Ask your doctor or nurse about medicine to stop or lessen headache, loose bowel movements (diarrhea), constipation, nausea, throwing up (vomiting), or pain.  . If you get a rash do not put anything it unless your doctor or nurse says you may. Keep the area around  the rash clean and dry. Ask your doctor for medicine if the rash bothers you.  . Drink 6-8 cups of fluids each day unless your doctor has told you to limit your fluid intake due to some other health problem. A cup is 8 ounces of fluid. If you throw up or have loose bowel movements, you should drink more fluids so that you do not become dehydrated (lack of water in the body from losing too much fluid).  . If you are not able to move your bowels, check with your doctor or nurse before you use enemas, laxatives, or suppositories  . If you have mouth sores, avoid mouthwash that has alcohol. Also avoid alcohol and smoking because they can bother your mouth and throat.  . If you have a nose bleed, sit with your head tipped slightly forward. Apply pressure by lightly pinching the bridge of your nose between your thumb and forefinger. Call your doctor if you feel dizzy or faint or if the bleeding doesn't stop after 10 to 15 minutes  Important Information  . After treatment with this drug, vaccination with live viruses should be delayed until the immune system recovers.  . Symptoms of abnormal bleeding may be: coughing up blood, throwing up blood (may look like coffee grounds), red or black, tarry bowel movements, blood in urine, abnormally heavy menstrual flow, nosebleeds, or any unusual bleeding.  . Symptoms of high blood pressure may be: headache, blurred vision, confusion, chest pain, or a feeling that your heart is beat differently.  Marland Kitchen Urinary tract infection. Symptoms may include:  . Pain or burning when you pass urine  . Feeling like you have to pass urine often, but not much comes out when you do.  . Tender or heavy feeling in your lower abdomen  . Cloudy urine and/or urine that smells bad.  . Pain on one side of your back under your ribs. This is where your kidneys are.  . Fever, chills, nausea and/or throwing up  Food and Drug Interactions  There are no known interactions of  rituximab and any food. This drug may interact with other medicines. Tell your doctor and pharmacist about all the medicines and dietary supplements (vitamins, minerals, herbs and others) that you are taking at this time. The safety and use of dietary supplements and alternative diets are often not known. Using these might affect your cancer or interfere with your treatment. Until more is known, you should not use dietary supplements or alternative diets without your cancer doctor's help.  When to Call the Doctor  Call your doctor or nurse right away if you have any of these symptoms:  . Fever of 100.5 F (38 C) higher  . Chills  . Trouble breathing  . Rash with or without itching  . Blistering or peeling of skin  . Chest pain or symptoms of a heart attack. Most heart attacks involve pain  in the center of the chest that lasts more than a few minutes. The pain may go away and come back or it can be constant. It can feel like pressure, squeezing, fullness, or pain. Sometimes pain is felt in one or both arms, the back, neck, jaw, or stomach. If any of these symptoms last 2 minutes, call 911  . Easy bleeding or bruising  . Blood in urine or bowel movements  . Feeling that your heart is beating in a fast or not normal way (palpitations)  . Nausea that stops you from eating or drinking  . Throwing up (vomiting) more than 3 times in one day  . Abdominal pain  . Loose bowel movements (diarrhea) 4 times in one day or diarrhea with weakness or lightheadedness  . No bowel movement in 3 days or if you feel uncomfortable  . Feeling dizzy or lightheaded  . Changes in your speech or vision  . Feeling confused  . Weakness of your arms and legs or poor coordination (feeling clumsy)  . Signs of liver problems: dark urine, pale bowel movements, bad stomach pain, feeling very tired and weak, unusual itching, or yellowing of skin or eyes  . Symptoms of a urinary tract infection (see important  information)  Call your doctor or nurse as soon as possible if you have any of these symptoms:  . Swelling of your legs, ankles and/or feet  . Fatigue and /or weakness that interferes with your daily activities  . Joint and muscle pain or muscle spasms that are not relieved by prescribed medicines  . Cough that lasts longer than normal    Reproduction Concerns  . Pregnancy warning: This drug is known to cross the placenta. This drug may have harmful effects on an unborn baby. Effective methods of birth control should be used during treatment with this drug and for 12 months after the last treatment. If exposure occurs to an unborn baby, the baby's immune system may be affected, which could last for months after birth. Until the immune system recovers, live vaccines should not be administered to the baby. Be sure to talk with your doctor if you are pregnant or planning to become pregnant while getting this drug.  . Breast feeding warning: It is not known if rituximab is passed into human breast milk. In animal studies, this drug was detected in in breast milk. For this reason, women should talk to their doctor about the risks and benefits of breast feeding during treatment with this drug because this drug may enter the breast milk and badly harm a breast feeding baby.   Venetoclax (Venclexta)  About This Drug Venetoclax is used to treat cancer. It is given orally (by mouth).  Possible Side Effects  Bone marrow suppression. This is a decrease in the number of white blood cells, red blood cells, and platelets. This may raise your risk of infection, make you tired and weak (fatigue), and raise your risk of bleeding.  Nausea and vomiting  Diarrhea (loose bowel movements)  Constipation (not able to move bowels)  Pain in your mouth/throat  Pain in your abdomen  Abnormal bleeding - symptoms may be coughing up blood, throwing up blood (may look like coffee grounds), red or black tarry  bowel movements, abnormally heavy menstrual flow, nosebleeds or       any other unusual bleeding.  Fever and fever in the setting of decreased white blood cells, which is a serious condition that can be life-threatening  Tiredness  Feeling dizzy  Infections, including upper respiratory infection, blood infection, and pneumonia  Cough  Trouble breathing  Bone and muscle pain  Back pain  Swelling of your legs, ankles and/or feet  Rash  Low blood pressure  Note: Each of the side effects above was reported in 20% or greater of patients treated with venetoclax in combination with other medications. Not all possible side effects are included above and the side effects you experience may be different depending on the combination of medications.  Warnings and Precautions  Tumor lysis syndrome: This drug may act on the cancer cells very quickly. This may affect how your kidneys work and can be life-threatening.  Severe decrease in the number of white blood cells. This may raise your risk of infection.  Severe infections, including viral, bacterial and fungal, which can be life-threatening.  Note: Some of the side effects above are very rare. If you have concerns and/or questions, please discuss them with your medical team.  Important Information  Talk to your doctor before receiving any vaccinations during your treatment. Some vaccinations are not recommended while receiving venetoclax.  How to Take Your Medication  Swallow the medicine whole with a meal and water at approximately the same time each day. Do not chew, break, cut or crush it.   Missed dose: If you have missed your dose by more than 8 hours, skip the missed dose and continue with your regular dosing schedule, and contact your physician. If you have missed a dose and it is within 8 hours of the time you usually take your dose, take the missed dose as soon as possible and then continue with your regular dosing  schedule.   If you vomit a dose, take your next dose at the regular time. Do not take 2 doses at the same time or extra doses.   Handling: Wash your hands after handling your medicine. Your caretakers should not handle your medicine with bare hands and should wear latex gloves.   This drug may be present in the saliva, tears, sweat, urine, stool, vomit, semen, and vaginal secretions. Talk to your doctor and/or your nurse about the necessary precautions to take during this time.   Storage: Store this medicine in the original container at room temperature.   Disposal of unused medicine: Do not flush any expired and/or unused medicine down the toilet or drain unless you are specifically instructed to do so on the medication label. Some facilities have       take-back programs and/or other options. If you do not have a take-back program in your area, then please discuss with your nurse or your doctor how to dispose of unused medicine.  Treating Side Effects  Manage tiredness by pacing your activities for the day.  Be sure to include periods of rest between energy-draining activities.  If you are dizzy, get up slowly after sitting or lying.  To decrease the risk of infection, wash your hands regularly.  Avoid close contact with people who have a cold, the flu, or other infections.  Take your temperature as your doctor or nurse tells you, and whenever you feel like you may have a fever.  To help decrease the risk of bleeding, use a soft toothbrush. Check with your nurse before using dental floss.  Be very careful when using knives or tools.  Use an electric shaver instead of a razor.  Drink plenty of fluids (a minimum of eight glasses per day is recommended).  If you throw  up or have loose bowel movements, you should drink more fluids so that you do not become dehydrated (lack of water in the body from losing too much fluid).  If you have diarrhea, eat low-fiber foods that are  high in protein and calories and avoid foods that can irritate your digestive tracts or lead to cramping.  If you are not able to move your bowels, check with your doctor or nurse before you use enemas, laxatives, or suppositories.  Ask your nurse or doctor about medicine that can lessen or stop your diarrhea and/or constipation.  To help with nausea and vomiting, eat small, frequent meals instead of three large meals a day. Choose foods and drinks that are at room temperature. Ask your nurse or doctor about other helpful tips and medicine that is available to help stop or lessen these symptoms.  Get regular exercise. If you feel too tired to exercise vigorously, try taking a short walk.  Keeping your pain under control is important to your well-being. Please tell your doctor or nurse if you are experiencing pain.  If you get a rash do not put anything on it unless your doctor or nurse says you may. Keep the area around the rash clean and dry. Ask your doctor for medicine if your rash bothers you.  Food and Drug Interactions  Avoid grapefruit products, [place] oranges, and starfruit while taking this medicine. These foods may raise the levels of venetoclax in your body. This could make side effects worse.  Check with your doctor or pharmacist about all other prescription medicines and over-the-counter medicines and dietary supplements (vitamins, minerals, herbs and others) you are taking before starting this medicine as there are known drug interactions with venetoclax. Also, check with your doctor or pharmacist before starting any new prescription or over-the-counter medicines, or dietary supplements to make sure that there are no interactions.  Avoid the use of St. John's Wort while taking venetoclax as this may lower the levels of the drug in your body, which can make it less effective.  There are known interactions of venetoclax with blood thinning medicine such as warfarin. Ask your doctor  what precautions you should take.  When to Call the Doctor Call your doctor or nurse if you have any of these symptoms and/or any new or unusual symptoms:   Fever of 100.4 F (38 C) or higher  Chills  Tiredness that interferes with your daily activities  Feeling dizzy or lightheaded  Feeling that your heart is beating a fast or in a not normal way (palpitations)  Fast breathing  Easy bleeding or bruising  Blood in your urine, vomit (bright red or coffee-ground) and/or stools (bright red, or black/tarry)  Coughing up blood  Wheezing or trouble breathing  Coughing up yellow, green, or bloody mucus  Nausea that stops you from eating or drinking and/or is not relieved by prescribed medicines  Throwing up more than 2 times a day  Diarrhea, 4 times in one day or diarrhea with lack of strength or a feeling of being dizzy  No bowel movement in 3 days or when you feel uncomfortable  Signs of tumor lysis: Confusion or agitation, decreased urine, nausea/vomiting, diarrhea, muscle       cramping, numbness and/or tingling, seizures.  Weight gain of 5 pounds in one week (fluid retention)  Swelling of your legs, ankles and/or feet  Pain that does not go away or is not relieved by prescribed medicine  Extreme weakness that interferes with normal  activities  New rash and/or itching  Rash that is not relieved by prescribed medicines  If you think you may be pregnant  Reproduction Warnings  Pregnancy warning: This drug can have harmful effects on the unborn baby. Women of       childbearing potential should use effective methods of birth control during your cancer treatment       and for at least 30 days after treatment. Let your doctor know right away if you think you may be       pregnant.  Breastfeeding warning: It is not known if this drug passes into breast milk. For this reason,       women should not breastfeed during treatment because this drug could enter the  breast milk and       cause harm to a breastfeeding baby.  Fertility warning: In men, this drug may affect your ability to have children in the future. Talk with       your doctor or nurse if you plan to have children. Ask for information on sperm banking.   SELF CARE ACTIVITIES WHILE ON IMMUNOTHERAPY:  Hydration Increase your fluid intake 48 hours prior to treatment and drink at least 8 to 12 cups (64 ounces) of water/decaffeinated beverages per day after treatment. You can still have your cup of coffee or soda but these beverages do not count as part of your 8 to 12 cups that you need to drink daily. No alcohol intake.  Medications Continue taking your normal prescription medication as prescribed.  If you start any new herbal or new supplements please let us know first to make sure it is safe.  Mouth Care Have teeth cleaned professionally before starting treatment. Keep dentures and partial plates clean. Use soft toothbrush and do not use mouthwashes that contain alcohol. Biotene is a good mouthwash that is available at most pharmacies or may be ordered by calling [phone]. Use warm salt water gargles (1 teaspoon salt per 1 quart warm water) before and after meals and at bedtime. Or you may rinse with 2 tablespoons of three-percent hydrogen peroxide mixed in eight ounces of water. If you are still having problems with your mouth or sores in your mouth please call the clinic. If you need dental work, please let the doctor know before you go for your appointment so that we can coordinate the best possible time for you in regards to your chemo regimen. You need to also let your dentist know that you are actively taking chemo. We may need to do labs prior to your dental appointment.  Skin Care Always use sunscreen that has not expired and with SPF (Sun Protection Factor) of 50 or higher. Wear hats to protect your head from the sun. Remember to use sunscreen on your hands, ears, face, & feet.   Use good moisturizing lotions such as udder cream, eucerin, or even Vaseline. Some chemotherapies can cause dry skin, color changes in your skin and nails.    . Avoid long, hot showers or baths. . Use gentle, fragrance-free soaps and laundry detergent. . Use moisturizers, preferably creams or ointments rather than lotions because the thicker consistency is better at preventing skin dehydration. Apply the cream or ointment within 15 minutes of showering. Reapply moisturizer at night, and moisturize your hands every time after you wash them.   Infection Prevention Please wash your hands for at least 30 seconds using warm soapy water. Handwashing is the #1 way to prevent the spread of  germs. Stay away from sick people or people who are getting over a cold. If you develop respiratory systems such as green/yellow mucus production or productive cough or persistent cough let us know and we will see if you need an antibiotic. It is a good idea to keep a pair of gloves on when going into grocery stores/[employer] to decrease your risk of coming into contact with germs on the carts, etc. Carry alcohol hand gel with you at all times and use it frequently if out in public. If your temperature reaches 100.5 or higher please call the clinic and let us know.  If it is after hours or on the weekend please go to the ER if your temperature is over 100.4.  Please have your own personal thermometer at home to use.    Sex and bodily fluids If you are going to have sex, a condom must be used to protect the person that isn't taking immunotherapy. For a few days after treatment, immunotherapy can be excreted through your bodily fluids.  When using the toilet please close the lid and flush the toilet twice.  Do this for a few day after you have had immunotherapy.   Contraception It is not known for sure whether or not immunotherapy drugs can be passed on through semen or secretions from the vagina. Because of this some doctors  advise people to use a barrier method if you have sex during treatment. This applies to vaginal, anal or oral sex.  Generally, doctors advise a barrier method only for the time you are actually having the treatment and for about a week after your treatment.  Advice like this can be worrying, but this does not mean that you have to avoid being intimate with your partner. You can still have close contact with your partner and continue to enjoy sex.  Animals If you have cats or birds we just ask that you not change the litter or change the cage.  Please have someone else do this for you while you are on immunotherapy.   Food Safety During and After Cancer Treatment Food safety is important for people both during and after cancer treatment. Cancer and cancer treatments, such as chemotherapy, radiation therapy, and stem cell/bone marrow transplantation, often weaken the immune system. This makes it harder for your body to protect itself from foodborne illness, also called food poisoning. Foodborne illness is caused by eating food that contains harmful bacteria, parasites, or viruses.  Foods to avoid Some foods have a higher risk of becoming tainted with bacteria. These include: Marland Kitchen Unwashed fresh fruit and vegetables, especially leafy vegetables that can hide dirt and other contaminants . Raw sprouts, such as alfalfa sprouts . Raw or undercooked beef, especially ground beef, or other raw or undercooked meat and poultry . Fatty, fried, or spicy foods immediately before or after treatment.  These can sit heavy on your stomach and make you feel nauseous. . Raw or undercooked shellfish, such as oysters. . Sushi and sashimi, which often contain raw fish.  . Unpasteurized beverages, such as unpasteurized fruit juices, raw milk, raw yogurt, or cider . Undercooked eggs, such as soft boiled, over easy, and poached; raw, unpasteurized eggs; or foods made with raw egg, such as homemade raw cookie dough and  homemade mayonnaise  Simple steps for food safety  Shop smart. . Do not buy food stored or displayed in an unclean area. . Do not buy bruised or damaged fruits or vegetables. . Do not buy  cans that have cracks, dents, or bulges. . Pick up foods that can spoil at the end of your shopping trip and store them in a cooler on the way home.  Prepare and clean up foods carefully. . Rinse all fresh fruits and vegetables under running water, and dry them with a clean towel or paper towel. . Clean the top of cans before opening them. . After preparing food, wash your hands for 20 seconds with hot water and soap. Pay special attention to areas between fingers and under nails. . Clean your utensils and dishes with hot water and soap. Marland Kitchen Disinfect your kitchen and cutting boards using 1 teaspoon of liquid, unscented bleach mixed into 1 quart of water.    Dispose of old food. . Eat canned and packaged food before its expiration date (the "use by" or "best before" date). . Consume refrigerated leftovers within 3 to 4 days. After that time, throw out the food. Even if the food does not smell or look spoiled, it still may be unsafe. Some bacteria, such as Listeria, can grow even on foods stored in the refrigerator if they are kept for too long.  Take precautions when eating out. . At restaurants, avoid buffets and salad bars where food sits out for a long time and comes in contact with many people. Food can become contaminated when someone with a virus, often a norovirus, or another "bug" handles it. . Put any leftover food in a "to-go" container yourself, rather than having the server do it. And, refrigerate leftovers as soon as you get home. . Choose restaurants that are clean and that are willing to prepare your food as you order it cooked.    SYMPTOMS TO REPORT AS SOON AS POSSIBLE AFTER TREATMENT:   FEVER GREATER THAN 100.4 F  CHILLS WITH OR WITHOUT FEVER  NAUSEA AND VOMITING THAT IS NOT  CONTROLLED WITH YOUR NAUSEA MEDICATION  UNUSUAL SHORTNESS OF BREATH  UNUSUAL BRUISING OR BLEEDING  TENDERNESS IN MOUTH AND THROAT WITH OR WITHOUT PRESENCE OF ULCERS  URINARY PROBLEMS  BOWEL PROBLEMS  UNUSUAL RASH      Wear comfortable clothing and clothing appropriate for easy access to any Portacath or PICC line. Let us know if there is anything that we can do to make your therapy better!    What to do if you need assistance after hours or on the weekends: CALL [phone].  HOLD on the line, do not hang up.  You will hear multiple messages but at the end you will be connected with a nurse triage line.  They will contact the doctor if necessary.  Most of the time they will be able to assist you.  Do not call the hospital operator.      I have been informed and understand all of the instructions given to me and have received a copy. I have been instructed to call the clinic [phone] or my family physician as soon as possible for continued medical care, if indicated. I do not have any more questions at this time but understand that I may call the [facility] or the Patient Navigator at [phone] during office hours should I have questions or need assistance in obtaining follow-up care.

## 2019-11-25 MED FILL — VENCLEXTA STARTING PACK: 10 & 50 & 1 | 28 days supply | Qty: 42 | Fill #0

## 2019-11-26 LAB — FISH HES LEUKEMIA, 4Q12 REA

## 2019-11-27 ENCOUNTER — Other Ambulatory Visit (HOSPITAL_COMMUNITY): Payer: Self-pay

## 2019-11-27 ENCOUNTER — Inpatient Hospital Stay (HOSPITAL_COMMUNITY): Payer: Medicare HMO | Attending: Hematology

## 2019-11-27 ENCOUNTER — Other Ambulatory Visit: Payer: Self-pay

## 2019-11-27 DIAGNOSIS — N189 Chronic kidney disease, unspecified: Secondary | ICD-10-CM | POA: Diagnosis not present

## 2019-11-27 DIAGNOSIS — R748 Abnormal levels of other serum enzymes: Secondary | ICD-10-CM | POA: Insufficient documentation

## 2019-11-27 DIAGNOSIS — C9110 Chronic lymphocytic leukemia of B-cell type not having achieved remission: Secondary | ICD-10-CM

## 2019-11-27 DIAGNOSIS — Z87891 Personal history of nicotine dependence: Secondary | ICD-10-CM | POA: Insufficient documentation

## 2019-11-27 DIAGNOSIS — D539 Nutritional anemia, unspecified: Secondary | ICD-10-CM | POA: Diagnosis not present

## 2019-11-27 LAB — HEPATITIS B CORE ANTIBODY, TOTAL: Hep B Core Total Ab: NONREACTIVE

## 2019-12-01 ENCOUNTER — Other Ambulatory Visit: Payer: Self-pay

## 2019-12-01 ENCOUNTER — Encounter (HOSPITAL_BASED_OUTPATIENT_CLINIC_OR_DEPARTMENT_OTHER)
Admission: RE | Admit: 2019-12-01 | Discharge: 2019-12-01 | Disposition: A | Discharge status: Home / Self Care | Payer: Medicare HMO | Source: Ambulatory Visit | Attending: Hematology | Admitting: Hematology

## 2019-12-01 DIAGNOSIS — R590 Localized enlarged lymph nodes: Secondary | ICD-10-CM | POA: Diagnosis not present

## 2019-12-01 DIAGNOSIS — C9110 Chronic lymphocytic leukemia of B-cell type not having achieved remission: Secondary | ICD-10-CM | POA: Insufficient documentation

## 2019-12-01 DIAGNOSIS — N200 Calculus of kidney: Secondary | ICD-10-CM | POA: Diagnosis not present

## 2019-12-01 DIAGNOSIS — R161 Splenomegaly, not elsewhere classified: Secondary | ICD-10-CM | POA: Diagnosis not present

## 2019-12-01 MED ORDER — FLUDEOXYGLUCOSE F - 18 (FDG) INJECTION
12.6900 | Freq: Once | INTRAVENOUS | Status: AC | PRN
  Administered 2019-12-01: 12.69 via INTRAVENOUS

## 2019-12-02 ENCOUNTER — Inpatient Hospital Stay (HOSPITAL_COMMUNITY)
Admission: AD | Admit: 2019-12-02 | Discharge: 2019-12-05 | DRG: 841 | Disposition: A | Discharge status: Home / Self Care | Payer: Medicare HMO | Attending: Internal Medicine | Admitting: Internal Medicine

## 2019-12-02 ENCOUNTER — Encounter (HOSPITAL_COMMUNITY): Payer: Self-pay | Admitting: *Deleted

## 2019-12-02 ENCOUNTER — Inpatient Hospital Stay (HOSPITAL_COMMUNITY): Payer: Medicare HMO

## 2019-12-02 ENCOUNTER — Inpatient Hospital Stay (HOSPITAL_COMMUNITY): Payer: Medicare HMO | Admitting: Hematology

## 2019-12-02 ENCOUNTER — Other Ambulatory Visit: Payer: Self-pay

## 2019-12-02 ENCOUNTER — Encounter (HOSPITAL_COMMUNITY): Payer: Self-pay | Admitting: Internal Medicine

## 2019-12-02 VITALS — BP 150/87 | HR 76 | Temp 98.0°F | Resp 16 | Wt 217.6 lb

## 2019-12-02 DIAGNOSIS — Z87820 Personal history of traumatic brain injury: Secondary | ICD-10-CM | POA: Diagnosis not present

## 2019-12-02 DIAGNOSIS — D631 Anemia in chronic kidney disease: Secondary | ICD-10-CM | POA: Diagnosis present

## 2019-12-02 DIAGNOSIS — Z20822 Contact with and (suspected) exposure to covid-19: Secondary | ICD-10-CM | POA: Diagnosis not present

## 2019-12-02 DIAGNOSIS — Z8616 Personal history of COVID-19: Secondary | ICD-10-CM

## 2019-12-02 DIAGNOSIS — I4891 Unspecified atrial fibrillation: Secondary | ICD-10-CM | POA: Diagnosis present

## 2019-12-02 DIAGNOSIS — Z79899 Other long term (current) drug therapy: Secondary | ICD-10-CM | POA: Diagnosis not present

## 2019-12-02 DIAGNOSIS — I480 Paroxysmal atrial fibrillation: Secondary | ICD-10-CM | POA: Diagnosis not present

## 2019-12-02 DIAGNOSIS — N189 Chronic kidney disease, unspecified: Secondary | ICD-10-CM | POA: Diagnosis not present

## 2019-12-02 DIAGNOSIS — R748 Abnormal levels of other serum enzymes: Secondary | ICD-10-CM | POA: Diagnosis not present

## 2019-12-02 DIAGNOSIS — S065XAA Traumatic subdural hemorrhage with loss of consciousness status unknown, initial encounter: Secondary | ICD-10-CM | POA: Diagnosis present

## 2019-12-02 DIAGNOSIS — E890 Postprocedural hypothyroidism: Secondary | ICD-10-CM | POA: Diagnosis present

## 2019-12-02 DIAGNOSIS — I120 Hypertensive chronic kidney disease with stage 5 chronic kidney disease or end stage renal disease: Secondary | ICD-10-CM | POA: Diagnosis present

## 2019-12-02 DIAGNOSIS — S065X9A Traumatic subdural hemorrhage with loss of consciousness of unspecified duration, initial encounter: Secondary | ICD-10-CM | POA: Diagnosis not present

## 2019-12-02 DIAGNOSIS — Z9189 Other specified personal risk factors, not elsewhere classified: Secondary | ICD-10-CM | POA: Diagnosis not present

## 2019-12-02 DIAGNOSIS — N400 Enlarged prostate without lower urinary tract symptoms: Secondary | ICD-10-CM | POA: Diagnosis present

## 2019-12-02 DIAGNOSIS — Z87891 Personal history of nicotine dependence: Secondary | ICD-10-CM | POA: Diagnosis not present

## 2019-12-02 DIAGNOSIS — Z8249 Family history of ischemic heart disease and other diseases of the circulatory system: Secondary | ICD-10-CM | POA: Diagnosis not present

## 2019-12-02 DIAGNOSIS — D696 Thrombocytopenia, unspecified: Secondary | ICD-10-CM | POA: Diagnosis present

## 2019-12-02 DIAGNOSIS — N185 Chronic kidney disease, stage 5: Secondary | ICD-10-CM | POA: Diagnosis present

## 2019-12-02 DIAGNOSIS — C9110 Chronic lymphocytic leukemia of B-cell type not having achieved remission: Secondary | ICD-10-CM

## 2019-12-02 DIAGNOSIS — D539 Nutritional anemia, unspecified: Secondary | ICD-10-CM | POA: Diagnosis present

## 2019-12-02 DIAGNOSIS — R161 Splenomegaly, not elsewhere classified: Secondary | ICD-10-CM | POA: Diagnosis present

## 2019-12-02 LAB — CBC WITH DIFFERENTIAL/PLATELET
Abs Immature Granulocytes: 0.1 10*3/uL — ABNORMAL HIGH (ref 0.00–0.07)
Basophils Absolute: 0 10*3/uL (ref 0.0–0.1)
Basophils Relative: 0 %
Eosinophils Absolute: 0.3 10*3/uL (ref 0.0–0.5)
Eosinophils Relative: 1 %
HCT: 32.2 % — ABNORMAL LOW (ref 39.0–52.0)
Hemoglobin: 10 g/dL — ABNORMAL LOW (ref 13.0–17.0)
Immature Granulocytes: 0 %
Lymphocytes Relative: 84 %
Lymphs Abs: 30.5 10*3/uL — ABNORMAL HIGH (ref 0.7–4.0)
MCH: 32.1 pg (ref 26.0–34.0)
MCHC: 31.1 g/dL (ref 30.0–36.0)
MCV: 103.2 fL — ABNORMAL HIGH (ref 80.0–100.0)
Monocytes Absolute: 1.5 10*3/uL — ABNORMAL HIGH (ref 0.1–1.0)
Monocytes Relative: 4 %
Neutro Abs: 3.9 10*3/uL (ref 1.7–7.7)
Neutrophils Relative %: 11 %
Platelets: 83 10*3/uL — ABNORMAL LOW (ref 150–400)
RBC: 3.12 MIL/uL — ABNORMAL LOW (ref 4.22–5.81)
RDW: 15.9 % — ABNORMAL HIGH (ref 11.5–15.5)
WBC: 36.2 10*3/uL — ABNORMAL HIGH (ref 4.0–10.5)
nRBC: 0 % (ref 0.0–0.2)

## 2019-12-02 LAB — PHOSPHORUS: Phosphorus: 3.8 mg/dL (ref 2.5–4.6)

## 2019-12-02 LAB — COMPREHENSIVE METABOLIC PANEL
ALT: 14 U/L (ref 0–44)
AST: 17 U/L (ref 15–41)
Albumin: 4.2 g/dL (ref 3.5–5.0)
Alkaline Phosphatase: 51 U/L (ref 38–126)
Anion gap: 8 (ref 5–15)
BUN: 50 mg/dL — ABNORMAL HIGH (ref 8–23)
CO2: 21 mmol/L — ABNORMAL LOW (ref 22–32)
Calcium: 8.8 mg/dL — ABNORMAL LOW (ref 8.9–10.3)
Chloride: 109 mmol/L (ref 98–111)
Creatinine, Ser: 5.41 mg/dL — ABNORMAL HIGH (ref 0.61–1.24)
GFR calc Af Amer: 11 mL/min — ABNORMAL LOW (ref >60)
GFR calc non Af Amer: 9 mL/min — ABNORMAL LOW (ref >60)
Glucose, Bld: 100 mg/dL — ABNORMAL HIGH (ref 70–99)
Potassium: 4 mmol/L (ref 3.5–5.1)
Sodium: 138 mmol/L (ref 135–145)
Total Bilirubin: 0.6 mg/dL (ref 0.3–1.2)
Total Protein: 6.6 g/dL (ref 6.5–8.1)

## 2019-12-02 LAB — URIC ACID: Uric Acid, Serum: 4.2 mg/dL (ref 3.7–8.6)

## 2019-12-02 LAB — LACTATE DEHYDROGENASE: LDH: 132 U/L (ref 98–192)

## 2019-12-02 LAB — MAGNESIUM: Magnesium: 2.4 mg/dL (ref 1.7–2.4)

## 2019-12-02 MED ORDER — ACETAMINOPHEN 325 MG PO TABS: 650.0000 mg | ORAL_TABLET | Freq: Four times a day (QID) | ORAL | Status: DC | PRN

## 2019-12-02 MED ORDER — ALLOPURINOL 300 MG PO TABS
300.0000 mg | ORAL_TABLET | Freq: Every day | ORAL | Status: DC
  Administered 2019-12-03 – 2019-12-05 (×3): 300 mg via ORAL
  Filled 2019-12-02 (×3): qty 1

## 2019-12-02 MED ORDER — VENETOCLAX 10 MG PO TABS
10.0000 mg | ORAL_TABLET | Freq: Every day | ORAL | Status: DC
  Administered 2019-12-02 – 2019-12-05 (×4): 10 mg via ORAL

## 2019-12-02 MED ORDER — SODIUM CHLORIDE 0.9 % IV SOLN
3.0000 mg | Freq: Once | INTRAVENOUS | Status: AC
  Administered 2019-12-02: 3 mg via INTRAVENOUS
  Filled 2019-12-02: qty 2

## 2019-12-02 MED ORDER — NON FORMULARY: 10.0000 mg | Freq: Every day | Status: DC

## 2019-12-02 MED ORDER — SODIUM CHLORIDE 0.9 % IV SOLN: INTRAVENOUS | Status: DC

## 2019-12-02 MED ORDER — NON FORMULARY: Freq: Every day | Status: DC

## 2019-12-02 MED ORDER — PROCHLORPERAZINE MALEATE 10 MG PO TABS
10.0000 mg | ORAL_TABLET | Freq: Four times a day (QID) | ORAL | 2 refills | Status: DC | PRN
Start: 2019-12-02 — End: 2020-03-11

## 2019-12-02 MED ORDER — CARVEDILOL 3.125 MG PO TABS
6.2500 mg | ORAL_TABLET | Freq: Two times a day (BID) | ORAL | Status: DC
  Administered 2019-12-02 – 2019-12-05 (×6): 6.25 mg via ORAL
  Filled 2019-12-02 (×6): qty 2

## 2019-12-02 MED ORDER — SODIUM BICARBONATE 650 MG PO TABS
650.0000 mg | ORAL_TABLET | Freq: Two times a day (BID) | ORAL | Status: DC
  Administered 2019-12-02 – 2019-12-05 (×6): 650 mg via ORAL
  Filled 2019-12-02 (×6): qty 1

## 2019-12-02 MED ORDER — ACETAMINOPHEN 650 MG RE SUPP: 650.0000 mg | Freq: Four times a day (QID) | RECTAL | Status: DC | PRN

## 2019-12-02 MED ORDER — POLYETHYLENE GLYCOL 3350 17 G PO PACK: 17.0000 g | PACK | Freq: Every day | ORAL | Status: DC | PRN

## 2019-12-02 NOTE — Progress Notes (Signed)
Orders placed for admission per Dr. Delton Coombes.

## 2019-12-02 NOTE — Addendum Note (Signed)
Addended by: Donetta Potts on: 12/02/2019 01:28 PM   Modules accepted: Orders

## 2019-12-02 NOTE — Patient Instructions (Signed)
[place] at [facility] Discharge Instructions  You were seen today by Dr. Delton Coombes. He went over your recent test results. He discussed admitting you to the hospital for a couple days of observation and to start the new medication. He will see you back  for labs and follow up.   Thank you for choosing [place] at [facility] to provide your oncology and hematology care.  To afford each patient quality time with our provider, please arrive at least 15 minutes before your scheduled appointment time.   If you have a lab appointment with the [facility] please come in thru the  Main Entrance and check in at the main information desk  You need to re-schedule your appointment should you arrive 10 or more minutes late.  We strive to give you quality time with our providers, and arriving late affects you and other patients whose appointments are after yours.  Also, if you no show three or more times for appointments you may be dismissed from the clinic at the providers discretion.     Again, thank you for choosing [facility].  Our hope is that these requests will decrease the amount of time that you wait before being seen by our physicians.       _____________________________________________________________  Should you have questions after your visit to [facility], please contact our office at [phone] between the hours of 8:00 a.m. and 4:30 p.m.  Voicemails left after 4:00 p.m. will not be returned until the following business day.  For prescription refill requests, have your pharmacy contact our office and allow 72 hours.    Cancer Center Support Programs:   > Cancer Support Group  2nd Tuesday of the month 1pm-2pm, Journey Room

## 2019-12-02 NOTE — H&P (Addendum)
History and Physical    Andrew [name] OZD:[id number] DOB: November 16, 1940 DOA: 12/02/2019  PCP: [name], MD   Patient coming from: Home  I have personally briefly reviewed patient's old medical records in [place]  Chief Complaint: Direct admit from cancer center for chemotherapy  HPI: Andrew [name] is a 79 y.o. male with medical history significant for CLL, CKD 4, ischemic cardiomyopathy, paroxysmal atrial fibrillation, subdural hematoma.  Patient was sent from the cancer center, directly admitted to the hospital for management of his CLL, and high risk for tumor lysis syndrome with venetoclax. On my evaluation, patient has no complaints, no cough, no difficulty breathing no vomiting no loose stools, no pain.   Review of Systems: As per HPI all other systems reviewed and negative.  Past Medical History:  Diagnosis Date  . CKD (chronic kidney disease), stage IV ([place])   . CLL (chronic lymphocytic leukemia) ([place])   . Hypertension   . NICM (nonischemic cardiomyopathy) ([place])    a. cath 2007, EF 35%, minimal CAD. b. EF normal 2018.  Marland Kitchen PAF (paroxysmal atrial fibrillation) ([place])   . Pinched nerve in neck   . SDH (subdural hematoma) ([place])   . Thrombocytopenia ([place])     Past Surgical History:  Procedure Laterality Date  . BIOPSY  08/05/2018   Procedure: BIOPSY;  Surgeon: Danie Binder, MD;  [facility];  Service: Endoscopy;;  gastric   . CATARACT EXTRACTION, BILATERAL    . COLONOSCOPY  07/10/2011   Procedure: COLONOSCOPY;  Surgeon: Dorothyann Peng, MD;  [facility];  Service: Endoscopy;  Laterality: N/A;  10:30 AM  . CRANIOTOMY N/A 10/14/2016   Procedure: CRANIOTOMY HEMATOMA EVACUATION SUBDURAL;  Surgeon: Jovita Gamma, MD;  Location: [facility];  Service: Neurosurgery;  Laterality: N/A;  CRANIOTOMY HEMATOMA EVACUATION SUBDURAL  . ESOPHAGOGASTRODUODENOSCOPY N/A 08/05/2018   Procedure: ESOPHAGOGASTRODUODENOSCOPY (EGD);  Surgeon: Danie Binder, MD;   [facility];  Service: Endoscopy;  Laterality: N/A;  3:00pm  . THYROIDECTOMY, PARTIAL    . TONSILLECTOMY       reports that he quit smoking about 48 years ago. He has a 15.00 pack-year smoking history. He has never used smokeless tobacco. He reports that he does not drink alcohol and does not use drugs.  No Known Allergies  Family History  Problem Relation Age of Onset  . CAD Father 45  . Cancer Mother   . Alzheimer's disease Mother   . Colon cancer Neg Hx     Medication Sig Start Date End Date Taking? Authorizing Provider  allopurinol (ZYLOPRIM) 300 MG tablet Take 1 tablet (300 mg total) by mouth daily. 11/13/19   Derek Jack, MD  carvedilol (COREG) 6.25 MG tablet Take 1 tablet (6.25 mg total) by mouth 2 (two) times daily. 05/16/18   [name], MD  Coenzyme Q10 (COQ-10) 200 MG CAPS Take 200 mg by mouth 2 (two) times daily.     [provider]  Cyanocobalamin (VITAMIN B12) 1000 MCG TBCR Take 1,000 mcg by mouth daily.  06/30/19   [provider]  diphenhydramine-acetaminophen (TYLENOL PM) 25-500 MG TABS tablet Take 1 tablet by mouth at bedtime as needed (sleep).  Patient not taking: Reported on 12/02/2019    [provider]  hydrALAZINE (APRESOLINE) 25 MG tablet Take 25 mg by mouth 2 (two) times daily. 09/11/18   [provider]  Multiple Vitamin (MULITIVITAMIN WITH MINERALS) TABS Take 1 tablet by mouth daily.  [provider]  omeprazole (PRILOSEC) 20 MG capsule 1 PO 30 MINS PRIOR TO BREAKFAST. 03/13/19   Erenest Rasher, PA-C  prochlorperazine (COMPAZINE) 10 MG tablet Take 1 tablet (10 mg total) by mouth every 6 (six) hours as needed for nausea or vomiting. 12/02/19   Derek Jack, MD  sodium bicarbonate 650 MG tablet Take 1 tablet (650 mg total) by mouth 2 (two) times daily. Patient taking differently: Take 325 mg by mouth 2 (two) times daily.  12/22/17   Georgette Shell, MD  tamsulosin (FLOMAX) 0.4 MG  CAPS capsule Take 0.4 mg by mouth daily.  07/19/17   [provider]  venetoclax 10 & 50 & 100 MG TBPK Take 20 mg once daily for 7 days followed by 50 mg once daily for 7 days followed by 100 mg once daily for 7 days, followed by 200 mg daily for 7 days. 11/13/19   Derek Jack, MD    Physical Exam: Vitals:   12/02/19 1700 12/02/19 1718 12/02/19 1836  BP:  (!) 147/86   Pulse: 76 70   Resp: (!) 23 (!) 24   SpO2: 98% 96%   Height:   6\' 2"  (1.88 m)    Constitutional: NAD, calm, comfortable Vitals:   12/02/19 1700 12/02/19 1718 12/02/19 1836  BP:  (!) 147/86   Pulse: 76 70   Resp: (!) 23 (!) 24   SpO2: 98% 96%   Height:   6\' 2"  (1.88 m)   Eyes: PERRL, lids and conjunctivae normal ENMT: Mucous membranes are moist. .  Neck: normal, supple, no masses, no thyromegaly Respiratory: clear to auscultation bilaterally, no wheezing, no crackles. Normal respiratory effort. No accessory muscle use.  Cardiovascular: Regular rate and rhythm, no murmurs / rubs / gallops. No extremity edema. 2+ pedal pulses.  Abdomen: no tenderness, no masses palpated. No hepatosplenomegaly. Bowel sounds positive.  Musculoskeletal: no clubbing / cyanosis. No joint deformity upper and lower extremities. Good ROM, no contractures. Normal muscle tone.  Skin: no rashes, lesions, ulcers. No induration Neurologic: No apparent cranial nerve abnormality, moving extremities spontaneously. Psychiatric: Normal judgment and insight. Alert and oriented x 3. Normal mood.   Labs on Admission: I have personally reviewed following labs and imaging studies  CBC: Recent Labs  Lab 12/02/19 1359  WBC 36.2*  NEUTROABS 3.9  HGB 10.0*  HCT 32.2*  MCV 103.2*  PLT 83*   Basic Metabolic Panel: Recent Labs  Lab 12/02/19 1359  NA 138  K 4.0  CL 109  CO2 21*  GLUCOSE 100*  BUN 50*  CREATININE 5.41*  CALCIUM 8.8*  MG 2.4  PHOS 3.8   Liver Function Tests: Recent Labs  Lab 12/02/19 1359  AST 17  ALT 14    ALKPHOS 51  BILITOT 0.6  PROT 6.6  ALBUMIN 4.2    Radiological Exams on Admission: NM PET Image Initial (PI) Skull Base To Thigh  Result Date: 12/02/2019 CLINICAL DATA:  Subsequent treatment strategy for chronic lymphocytic leukemia. EXAM: NUCLEAR MEDICINE PET SKULL BASE TO THIGH TECHNIQUE: 12.6 mCi F-18 FDG was injected intravenously. Full-ring PET imaging was performed from the skull base to thigh after the radiotracer. CT data was obtained and used for attenuation correction and anatomic localization. Fasting blood glucose: 84 mg/dl COMPARISON:  PET-CT 11/04/2018 FINDINGS: Mediastinal blood pool activity: SUV max 1.8 Liver activity: SUV max 2.9 NECK: Again demonstrated multiple bilateral enlarged cervical lymph nodes. Nodes appears slightly larger than prior. For example LEFT level III lymph node (image 33)  measures 12 mm compares to 7 mm on PET-CT 11/04/2018. Lesion has low metabolic activity SUV max equal 2.1. RIGHT level I lymph node on the RIGHT measures 12 mm compared to 8 mm (46/3) with SUV max equal 2.3. Incidental CT findings: none CHEST: Bilateral axillary and mediastinal adenopathy. Mediastinal lymph nodes increased in size moderately. For example prevascular node measuring 12 mm (image 88) compares to 10 mm. LEFT lower paratracheal node measures 12 mm compared with 7 mm. Lymph nodes have low metabolic activity similar to or less background blood pool. Axial lymph nodes are slightly enlarged with SUV max equal 1.9 compared to 1.3 on comparison scan Incidental CT findings: No suspicious nodularity. ABDOMEN/PELVIS: Minimal adenopathy in the retroperitoneum along the aorta. External iliac nodes are prominent. Example RIGHT external iliac lymph node measuring 14 mm compares to 13 mm. SUV max equal 2.0 Spleen is enlarged measures 16 cm craniocaudad dimension compared to 14.5 cm on prior no abnormal hypermetabolic activity. Incidental CT findings: Bilateral nonobstructing renal calculi. Prostate  normal SKELETON: No abnormal activity in bone marrow. Incidental CT findings: none IMPRESSION: 1. Mild increase in size of lymph nodes in the neck, axilla, and mediastinum accompanied by mild increase in metabolic activity. Activity is ranges from less than to slightly above background blood ( [place] 2/3) 2. Adenopathy in the abdomen and pelvis is similar. 3. Mild increase in volume of the spleen which has normal metabolic activity. Electronically Signed   By: Suzy Bouchard M.D.   On: 12/02/2019 08:16    EKG: None.   Assessment/Plan Active Problems:   CLL (chronic lymphocytic leukemia) (HCC)   CKD (chronic kidney disease) stage 5, GFR less than 15 ml/min (HCC)   At high risk of tumor lysis syndrome   Macrocytic anemia   Chronic lymphocytic leukemia, high risk for tumor lysis syndrome-follows with Dr. Delton Coombes, see consult note in chart.  Leukocytosis of 36.2, lymphocyte predominant. PET scan on 12/01/2019 showed multiple bilateral enlarged cervical lymph nodes, axillary and mediastinal adenopathy, External iliac lymph nodes are prominent. Spleen measures 16 cm. (See detailed report).  Magnesium, potassium, phosphate and uric acid normal today. -Admission requested due to patient's high risk for tumor lysis syndrome unfortunately patient is in ICU overflow as no beds available. -Management per oncology, venetoclax 10 mg daily started -Continue allopurinol 300 mg daily -Rasburicase also given -Normal saline 100 mils per hour -Daily CBC, CMP, phosphorus, magnesium and uric acid  Macrocytic anemia, thrombocytopenia-hemoglobin stable at 10, platelets low 83. - Per Oncology -Daily CBC  History of subdural hematoma  History of atrial fibrillation-rate controlled, not anticoagulation.  Follows with Dr. Harl Bowie. Remote DCCV 15 years.  Not a candidate for anticoagulation given his subdural hematoma. -Resume carvedilol  Hypertension-stable. -Resume carvedilol -Resume hydralazine pending med  reconciliation  CKD 4-creatinine 5.41, stable the past 3 weeks, but has gradually progressed over the past several months his nephrologist felt that worsening of his kidney function was secondary to CLL.   - IVF Fluids, - resume bicarbonate.  COVID-19 virus positivity-10/26/19, patient had completed both doses of Covid vaccine.  Patient had mild symptoms of -runny nose nasal sinus congestion and fever. -Covid test not repeated, with recent positive test.  DVT prophylaxis: SCDs Code Status: Full code Family Communication: None at bedside Disposition Plan: Per oncology team. Consults called: Oncology Admission status: Inpatient, telemetry I certify that at the point of admission it is my clinical judgment that the patient will require inpatient hospital care spanning beyond 2 midnights from the point  of admission due to high intensity of service, high risk for further deterioration and high frequency of surveillance required.    Bethena Roys MD [facility]  12/02/2019, 10:35 PM

## 2019-12-02 NOTE — Progress Notes (Signed)

## 2019-12-02 NOTE — Consult Note (Signed)
[facility] Consultation Oncology  Name: Andrew [name]      MRN: [id number]    Location: IC05/IC05-01  Date: 12/02/2019 Time:6:33 PM   REFERRING PHYSICIAN: Dr. Shah  REASON FOR CONSULT: Chronic lymphocytic leukemia   DIAGNOSIS: High risk for tumor lysis syndrome  HISTORY OF PRESENT ILLNESS: Andrew [name] is a 78-year-old very pleasant white male who is seen in consultation today for further management of CLL and high risk for tumor lysis syndrome with initiation of venetoclax.  He was diagnosed with CLL in March 2018.  As he developed progressive thrombocytopenia, he was treated with ibrutinib in the past from 08/19/2016 through May 2018 resulting in subdural hematoma.  Since then he was on chlorambucil from June 2019 through June 2020. He had kidney biopsy on 12/19/2017 which showed CLL involvement with diffuse severe interstitial nephritis and associated tubulocentric granulomatous reaction.  He denies any recent infections although he suffered from COVID-19 infection few months ago.  He had PET CT scan done recently.  He is accompanied by his wife today.  He reports appetite and energy level of 100%.  No pains reported.  Denies any bleeding per rectum or melena.  No fevers, night sweats or weight loss reported in the last few months.  No recent or recurrent infections noted.  PAST MEDICAL HISTORY:   Past Medical History:  Diagnosis Date  . CKD (chronic kidney disease), stage IV (HCC)   . CLL (chronic lymphocytic leukemia) (HCC)   . Hypertension   . NICM (nonischemic cardiomyopathy) (HCC)    a. cath 2007, EF 35%, minimal CAD. b. EF normal 2018.  . PAF (paroxysmal atrial fibrillation) (HCC)   . Pinched nerve in neck   . SDH (subdural hematoma) (HCC)   . Thrombocytopenia (HCC)     ALLERGIES: No Known Allergies    MEDICATIONS: I have reviewed the patient's current medications.     PAST SURGICAL HISTORY Past Surgical History:  Procedure Laterality Date  . BIOPSY  08/05/2018    Procedure: BIOPSY;  Surgeon: Fields, Sandi L, MD;  [facility]: [facility];  Service: Endoscopy;;  gastric   . CATARACT EXTRACTION, BILATERAL    . COLONOSCOPY  07/10/2011   Procedure: COLONOSCOPY;  Surgeon: Sandi M Fields, MD;  [facility];  Service: Endoscopy;  Laterality: N/A;  10:30 AM  . CRANIOTOMY N/A 10/14/2016   Procedure: CRANIOTOMY HEMATOMA EVACUATION SUBDURAL;  Surgeon: Nudelman, Robert, MD;  Location: [facility] OR;  Service: Neurosurgery;  Laterality: N/A;  CRANIOTOMY HEMATOMA EVACUATION SUBDURAL  . ESOPHAGOGASTRODUODENOSCOPY N/A 08/05/2018   Procedure: ESOPHAGOGASTRODUODENOSCOPY (EGD);  Surgeon: Fields, Sandi L, MD;  Location: [facility];  Service: Endoscopy;  Laterality: N/A;  3:00pm  . THYROIDECTOMY, PARTIAL    . TONSILLECTOMY      FAMILY HISTORY: Family History  Problem Relation Age of Onset  . CAD Father 52  . Cancer Mother   . Alzheimer's disease Mother   . Colon cancer Neg Hx     SOCIAL HISTORY:  reports that he quit smoking about 48 years ago. He has a 15.00 pack-year smoking history. He has never used smokeless tobacco. He reports that he does not drink alcohol and does not use drugs.  PERFORMANCE STATUS: The patient's performance status is 1 - Symptomatic but completely ambulatory  PHYSICAL EXAM: Most Recent Vital Signs: Blood pressure (!) 147/86, pulse 70, resp. rate (!) 24, SpO2 96 %. BP (!) 147/86   Pulse 70   Resp (!) 24   SpO2 96%  General   appearance: alert, cooperative and appears stated age Lungs: clear to auscultation bilaterally Heart: regular rate and rhythm Abdomen: Soft, nontender, mild splenomegaly palpable. Extremities: No edema or cyanosis. Skin: Skin color, texture, turgor normal. No rashes or lesions Lymph nodes: Small bilateral neck lymph nodes palpable.  Very small bilateral axillary adenopathy palpable. Neurologic: Grossly normal  LABORATORY DATA:  Results for orders placed or performed in visit on 12/02/19 (from the past 48  hour(s))  Lactate dehydrogenase     Status: None   Collection Time: 12/02/19  1:59 PM  Result Value Ref Range   LDH 132 98 - 192 U/L    Comment: Performed at [facility], [address] [place], [place] [address]  Comprehensive metabolic panel     Status: Abnormal   Collection Time: 12/02/19  1:59 PM  Result Value Ref Range   Sodium 138 135 - 145 mmol/L   Potassium 4.0 3.5 - 5.1 mmol/L   Chloride 109 98 - 111 mmol/L   CO2 21 (L) 22 - 32 mmol/L   Glucose, Bld 100 (H) 70 - 99 mg/dL    Comment: Glucose reference range applies only to samples taken after fasting for at least 8 hours.   BUN 50 (H) 8 - 23 mg/dL   Creatinine, Ser 5.41 (H) 0.61 - 1.24 mg/dL   Calcium 8.8 (L) 8.9 - 10.3 mg/dL   Total Protein 6.6 6.5 - 8.1 g/dL   Albumin 4.2 3.5 - 5.0 g/dL   AST 17 15 - 41 U/L   ALT 14 0 - 44 U/L   Alkaline Phosphatase 51 38 - 126 U/L   Total Bilirubin 0.6 0.3 - 1.2 mg/dL   GFR calc non Af Amer 9 (L) >60 mL/min   GFR calc Af Amer 11 (L) >60 mL/min   Anion gap 8 5 - 15    Comment: Performed at [facility], [address] [place], [place] [address]  CBC with Differential/Platelet     Status: Abnormal   Collection Time: 12/02/19  1:59 PM  Result Value Ref Range   WBC 36.2 (H) 4.0 - 10.5 K/uL    Comment: WHITE COUNT CONFIRMED ON SMEAR   RBC 3.12 (L) 4.22 - 5.81 MIL/uL   Hemoglobin 10.0 (L) 13.0 - 17.0 g/dL   HCT 32.2 (L) 39 - 52 %   MCV 103.2 (H) 80.0 - 100.0 fL   MCH 32.1 26.0 - 34.0 pg   MCHC 31.1 30.0 - 36.0 g/dL   RDW 15.9 (H) 11.5 - 15.5 %   Platelets 83 (L) 150 - 400 K/uL    Comment: SPECIMEN CHECKED FOR CLOTS Immature Platelet Fraction may be clinically indicated, consider ordering this additional test [id number] PLATELET COUNT CONFIRMED BY SMEAR    nRBC 0.0 0.0 - 0.2 %   Neutrophils Relative % 11 %   Neutro Abs 3.9 1.7 - 7.7 K/uL   Lymphocytes Relative 84 %   Lymphs Abs 30.5 (H) 0.7 - 4.0 K/uL   Monocytes Relative 4 %   Monocytes Absolute 1.5 (H) 0 - 1 K/uL    Eosinophils Relative 1 %   Eosinophils Absolute 0.3 0 - 0 K/uL   Basophils Relative 0 %   Basophils Absolute 0.0 0 - 0 K/uL   Immature Granulocytes 0 %   Abs Immature Granulocytes 0.10 (H) 0.00 - 0.07 K/uL   Smudge Cells PRESENT     Comment: Performed at [facility], [address], [place], [place] [address]  Uric acid     Status:  None   Collection Time: 12/02/19  1:59 PM  Result Value Ref Range   Uric Acid, Serum 4.2 3.7 - 8.6 mg/dL    Comment: Performed at [facility], [address], [place], [place] [address]  Phosphorus     Status: None   Collection Time: 12/02/19  1:59 PM  Result Value Ref Range   Phosphorus 3.8 2.5 - 4.6 mg/dL    Comment: Performed at [facility], [address] [place], [place] [address]  Magnesium     Status: None   Collection Time: 12/02/19  1:59 PM  Result Value Ref Range   Magnesium 2.4 1.7 - 2.4 mg/dL    Comment: Performed at [facility], [address], [place], [place] [address]      RADIOGRAPHY: NM PET Image Initial (PI) Skull Base To Thigh  Result Date: 12/02/2019 CLINICAL DATA:  Subsequent treatment strategy for chronic lymphocytic leukemia. EXAM: NUCLEAR MEDICINE PET SKULL BASE TO THIGH TECHNIQUE: 12.6 mCi F-18 FDG was injected intravenously. Full-ring PET imaging was performed from the skull base to thigh after the radiotracer. CT data was obtained and used for attenuation correction and anatomic localization. Fasting blood glucose: 84 mg/dl COMPARISON:  PET-CT 11/04/2018 FINDINGS: Mediastinal blood pool activity: SUV max 1.8 Liver activity: SUV max 2.9 NECK: Again demonstrated multiple bilateral enlarged cervical lymph nodes. Nodes appears slightly larger than prior. For example LEFT level III lymph node (image 33) measures 12 mm compares to 7 mm on PET-CT 11/04/2018. Lesion has low metabolic activity SUV max equal 2.1. RIGHT level I lymph node on the RIGHT measures 12 mm compared to 8 mm (46/3) with SUV max equal 2.3. Incidental CT findings: none  CHEST: Bilateral axillary and mediastinal adenopathy. Mediastinal lymph nodes increased in size moderately. For example prevascular node measuring 12 mm (image 88) compares to 10 mm. LEFT lower paratracheal node measures 12 mm compared with 7 mm. Lymph nodes have low metabolic activity similar to or less background blood pool. Axial lymph nodes are slightly enlarged with SUV max equal 1.9 compared to 1.3 on comparison scan Incidental CT findings: No suspicious nodularity. ABDOMEN/PELVIS: Minimal adenopathy in the retroperitoneum along the aorta. External iliac nodes are prominent. Example RIGHT external iliac lymph node measuring 14 mm compares to 13 mm. SUV max equal 2.0 Spleen is enlarged measures 16 cm craniocaudad dimension compared to 14.5 cm on prior no abnormal hypermetabolic activity. Incidental CT findings: Bilateral nonobstructing renal calculi. Prostate normal SKELETON: No abnormal activity in bone marrow. Incidental CT findings: none IMPRESSION: 1. Mild increase in size of lymph nodes in the neck, axilla, and mediastinum accompanied by mild increase in metabolic activity. Activity is ranges from less than to slightly above background blood ( [place] 2/3) 2. Adenopathy in the abdomen and pelvis is similar. 3. Mild increase in volume of the spleen which has normal metabolic activity. Electronically Signed   By: Suzy Bouchard M.D.   On: 12/02/2019 08:16       ASSESSMENT and PLAN:  1.  CLL, Rai stage IV: -PET scan on 12/01/2019 showed multiple bilateral enlarged cervical lymph nodes.  Bilateral axillary and mediastinal adenopathy, prevascular lymph node measuring 12 mm, left lower paratracheal lymph node measuring 12 mm.  Axillary lymph nodes has SUV 1.9.  Minimal adenopathy in the retroperitoneum along the aorta.  External iliac lymph nodes are prominent.  Right external iliac lymph node measures 14 mm with SUV 2.0.  Spleen measures 16 cm. -CBC shows white count 36.2 with absolute lymphocyte  count  of 30.5.  LDH was 132. -His creatinine has slightly worsened to 5.41.  His nephrologist felt that worsening of his kidney function was secondary to CLL. -Hence initiation of treatment with venetoclax and rituximab was recommended. 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  IGH V was mutated.  T p53 mutation testing was pending. -We have reviewed side effects of venetoclax in detail. -He is on Coreg 6.25 mg twice daily for his atrial fibrillation.  Hence we will cut back on the dose of venetoclax by 50%. -He will start venetoclax 10 mg tonight.  2.  TLS prophylaxis: -Based on absolute lymphocyte count more than 30,000, renal insufficiency, he is at high risk for tumor lysis syndrome. -Hence I have recommended admission to the hospital and monitoring for the first couple of days.  I have reached out to Dr. Shah who kindly agreed to get this patient admitted. -He is already on allopurinol which she will continue 300 mg daily. -We will give him rasburicase 3 mg flat dose today.  He will be started on normal saline at 100 mL/h. -I will monitor tumor lysis labs twice daily. -Labs today reviewed shows normal uric acid, magnesium, potassium and phosphate.  3.  Macrocytic anemia: -Hemoglobin today is 10.3. -Anemia panel on 11/13/2019 showed ferritin of 132, percent saturation of 28.  Folic acid and B12 was normal.  LDH and reticulocyte percentage was normal indicating no evidence of hemolysis. -We will consider parenteral iron therapy (Feraheme weekly x2) if there is any further drop of hemoglobin.  4.  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 -His latest creatinine is 5.41 and gradually worsening. -Likely will improve with initiation of CLL therapy.  He will continue IV fluids at 100 mL/h while admitted.  Encouraged oral intake of fluids.  All questions were answered. The patient  knows to call the clinic with any problems, questions or concerns. We can certainly see the patient much sooner if necessary.    Sreedhar Katragadda      

## 2019-12-02 NOTE — Progress Notes (Signed)
I spoke with Caryl Pina, RN in ICU and gave report on admission.

## 2019-12-03 LAB — BASIC METABOLIC PANEL
Anion gap: 8 (ref 5–15)
BUN: 52 mg/dL — ABNORMAL HIGH (ref 8–23)
CO2: 21 mmol/L — ABNORMAL LOW (ref 22–32)
Calcium: 8.7 mg/dL — ABNORMAL LOW (ref 8.9–10.3)
Chloride: 109 mmol/L (ref 98–111)
Creatinine, Ser: 5.36 mg/dL — ABNORMAL HIGH (ref 0.61–1.24)
GFR calc Af Amer: 11 mL/min — ABNORMAL LOW (ref >60)
GFR calc non Af Amer: 9 mL/min — ABNORMAL LOW (ref >60)
Glucose, Bld: 114 mg/dL — ABNORMAL HIGH (ref 70–99)
Potassium: 4.4 mmol/L (ref 3.5–5.1)
Sodium: 138 mmol/L (ref 135–145)

## 2019-12-03 LAB — CBC
HCT: 29.3 % — ABNORMAL LOW (ref 39.0–52.0)
Hemoglobin: 9.2 g/dL — ABNORMAL LOW (ref 13.0–17.0)
MCH: 32.4 pg (ref 26.0–34.0)
MCHC: 31.4 g/dL (ref 30.0–36.0)
MCV: 103.2 fL — ABNORMAL HIGH (ref 80.0–100.0)
Platelets: 72 10*3/uL — ABNORMAL LOW (ref 150–400)
RBC: 2.84 MIL/uL — ABNORMAL LOW (ref 4.22–5.81)
RDW: 15.7 % — ABNORMAL HIGH (ref 11.5–15.5)
WBC: 31.3 10*3/uL — ABNORMAL HIGH (ref 4.0–10.5)
nRBC: 0 % (ref 0.0–0.2)

## 2019-12-03 LAB — COMPREHENSIVE METABOLIC PANEL
ALT: 11 U/L (ref 0–44)
AST: 14 U/L — ABNORMAL LOW (ref 15–41)
Albumin: 3.7 g/dL (ref 3.5–5.0)
Alkaline Phosphatase: 42 U/L (ref 38–126)
Anion gap: 9 (ref 5–15)
BUN: 50 mg/dL — ABNORMAL HIGH (ref 8–23)
CO2: 20 mmol/L — ABNORMAL LOW (ref 22–32)
Calcium: 8.5 mg/dL — ABNORMAL LOW (ref 8.9–10.3)
Chloride: 110 mmol/L (ref 98–111)
Creatinine, Ser: 5.45 mg/dL — ABNORMAL HIGH (ref 0.61–1.24)
GFR calc Af Amer: 11 mL/min — ABNORMAL LOW (ref >60)
GFR calc non Af Amer: 9 mL/min — ABNORMAL LOW (ref >60)
Glucose, Bld: 89 mg/dL (ref 70–99)
Potassium: 3.6 mmol/L (ref 3.5–5.1)
Sodium: 139 mmol/L (ref 135–145)
Total Bilirubin: 0.7 mg/dL (ref 0.3–1.2)
Total Protein: 5.8 g/dL — ABNORMAL LOW (ref 6.5–8.1)

## 2019-12-03 LAB — URIC ACID
Uric Acid, Serum: 2.1 mg/dL — ABNORMAL LOW (ref 3.7–8.6)
Uric Acid, Serum: 0.7 mg/dL — ABNORMAL LOW (ref 3.7–8.6)

## 2019-12-03 LAB — MAGNESIUM
Magnesium: 2.4 mg/dL (ref 1.7–2.4)
Magnesium: 2.3 mg/dL (ref 1.7–2.4)

## 2019-12-03 LAB — PHOSPHORUS
Phosphorus: 3.6 mg/dL (ref 2.5–4.6)
Phosphorus: 3.9 mg/dL (ref 2.5–4.6)

## 2019-12-03 LAB — MRSA PCR SCREENING: MRSA by PCR: POSITIVE — AB

## 2019-12-03 MED ORDER — MUPIROCIN 2 % EX OINT
1.0000 "application " | TOPICAL_OINTMENT | Freq: Two times a day (BID) | CUTANEOUS | Status: DC
  Administered 2019-12-03 – 2019-12-05 (×5): 1 via NASAL
  Filled 2019-12-03 (×2): qty 22

## 2019-12-03 MED ORDER — CHLORHEXIDINE GLUCONATE CLOTH 2 % EX PADS
6.0000 | MEDICATED_PAD | Freq: Every day | CUTANEOUS | Status: DC
  Administered 2019-12-03 – 2019-12-05 (×3): 6 via TOPICAL

## 2019-12-03 MED ORDER — TAMSULOSIN HCL 0.4 MG PO CAPS
0.4000 mg | ORAL_CAPSULE | Freq: Every day | ORAL | Status: DC
  Administered 2019-12-03 – 2019-12-05 (×3): 0.4 mg via ORAL
  Filled 2019-12-03 (×3): qty 1

## 2019-12-03 NOTE — Consult Note (Signed)
[facility] Oncology Progress Note  Name: SHAMAN MUSCARELLA      MRN: [id number]    Location: A306/A306-01  Date: 12/03/2019 Time:6:33 PM   Subjective: Interval History:Osker Bing Plume seen today for follow-up.  He is lying comfortably in the bed.  He had good appetite and has eaten all his meals.  Denied any nausea or vomiting or diarrhea.  He received first dose of venetoclax last night and second dose this morning.  He is continuing to receive IV hydration.  He also received rasburicase last night.  Did not have any skin itching or rashes.  Objective: Vital signs in last 24 hours: Temp:  [97.3 F (36.3 C)-98.3 F (36.8 C)] 98 F (36.7 C) (07/07 1440) Pulse Rate:  [61-79] 61 (07/07 1440) Resp:  [16-22] 16 (07/07 1440) BP: (129-137)/(67-80) 129/80 (07/07 1440) SpO2:  [95 %-98 %] 98 % (07/07 1440) Weight:  [220 lb 7.4 oz (100 kg)] 220 lb 7.4 oz (100 kg) (07/07 0500)    Intake/Output from previous day: 07/06 0800 - 07/07 0759 In: 1446.4 [I.V.:1446.4] Out: 2850 [Urine:2850]    Intake/Output this shift: No intake/output data recorded.   PHYSICAL EXAM: BP 137/67   Pulse 75   Temp 97.9 F (36.6 C) (Oral)   Resp 19   Ht '6\' 2"'  (1.88 m)   Wt 220 lb 7.4 oz (100 kg)   SpO2 96%   BMI 28.31 kg/m  General appearance: alert, cooperative and appears stated age Lungs: clear to auscultation bilaterally Heart: irregularly irregular rhythm Abdomen: Soft, nontender, spleen tip palpable. Extremities: No edema.  No ecchymosis. Skin: Skin color, texture, turgor normal. No rashes or lesions Neurologic: Grossly normal   Studies/Results: Results for orders placed or performed during the hospital encounter of 12/02/19 (from the past 48 hour(s))  MRSA PCR Screening     Status: Abnormal   Collection Time: 12/02/19  5:03 PM   Specimen: Nasal Mucosa; Nasopharyngeal  Result Value Ref Range   MRSA by PCR POSITIVE (A) NEGATIVE    Comment:        The GeneXpert MRSA Assay (FDA approved for  NASAL specimens only), is one component of a comprehensive MRSA colonization surveillance program. It is not intended to diagnose MRSA infection nor to guide or monitor treatment for MRSA infections. RESULT CALLED TO, READ BACK BY AND VERIFIED WITH: WAGNOR,R'@0615'  BY MATTHEWS, B 7.7.21 Performed at [facility], [address] [place], [place] [address]   Magnesium     Status: None   Collection Time: 12/03/19  3:45 AM  Result Value Ref Range   Magnesium 2.4 1.7 - 2.4 mg/dL    Comment: Performed at [facility], [address] [place], [place] [address]  Phosphorus     Status: None   Collection Time: 12/03/19  3:45 AM  Result Value Ref Range   Phosphorus 3.6 2.5 - 4.6 mg/dL    Comment: Performed at [facility], [address], [place], [place] [address]  Uric acid     Status: Abnormal   Collection Time: 12/03/19  3:45 AM  Result Value Ref Range   Uric Acid, Serum 2.1 (L) 3.7 - 8.6 mg/dL    Comment: Performed at [facility], [address] [place],  [address]  Comprehensive metabolic panel     Status: Abnormal   Collection Time: 12/03/19  3:45 AM  Result Value Ref Range   Sodium 139 135 - 145 mmol/L   Potassium 3.6 3.5 - 5.1 mmol/L   Chloride 110 98 -  111 mmol/L   CO2 20 (L) 22 - 32 mmol/L   Glucose, Bld 89 70 - 99 mg/dL    Comment: Glucose reference range applies only to samples taken after fasting for at least 8 hours.   BUN 50 (H) 8 - 23 mg/dL   Creatinine, Ser 5.45 (H) 0.61 - 1.24 mg/dL   Calcium 8.5 (L) 8.9 - 10.3 mg/dL   Total Protein 5.8 (L) 6.5 - 8.1 g/dL   Albumin 3.7 3.5 - 5.0 g/dL   AST 14 (L) 15 - 41 U/L   ALT 11 0 - 44 U/L   Alkaline Phosphatase 42 38 - 126 U/L   Total Bilirubin 0.7 0.3 - 1.2 mg/dL   GFR calc non Af Amer 9 (L) >60 mL/min   GFR calc Af Amer 11 (L) >60 mL/min   Anion gap 9 5 - 15    Comment: Performed at [facility], [address], [place], [place] [address]  CBC     Status: Abnormal   Collection Time: 12/03/19  3:45 AM  Result  Value Ref Range   WBC 31.3 (H) 4.0 - 10.5 K/uL   RBC 2.84 (L) 4.22 - 5.81 MIL/uL   Hemoglobin 9.2 (L) 13.0 - 17.0 g/dL   HCT 29.3 (L) 39 - 52 %   MCV 103.2 (H) 80.0 - 100.0 fL   MCH 32.4 26.0 - 34.0 pg   MCHC 31.4 30.0 - 36.0 g/dL   RDW 15.7 (H) 11.5 - 15.5 %   Platelets 72 (L) 150 - 400 K/uL    Comment: SPECIMEN CHECKED FOR CLOTS CONSISTENT WITH PREVIOUS RESULT Immature Platelet Fraction may be clinically indicated, consider ordering this additional test [id number]    nRBC 0.0 0.0 - 0.2 %    Comment: Performed at [facility], [address], [place],  [address]   No results found.   MEDICATIONS: I have reviewed the patient's current medications.     Assessment/Plan:  1.  CLL, Rai stage IV: -PET scan on 12/01/2019 showed multiple bilateral enlarged cervical lymph nodes, bilateral axillary and mediastinal adenopathy, prevascular lymph node measuring 12 mm, left lower paratracheal lymph node measuring 12 mm.  Spleen measures 16 cm.  There is external iliac, and minimal retroperitoneal adenopathy present. -CLL FISH panel showed 13 q. deletion and IGH V mutated status positive.  T p53 mutation testing pending. -Venetoclax 10 mg daily ramp-up dose started on 12/02/2019. -Received second dose this morning.  Did not have any GI side effects. -White count was 31.3, down from 36.2.  Platelet count was 72, down from 83. -Because of carvedilol, we have cut back on venetoclax to 10 mg daily.  2.  TLS prophylaxis: -He is at high risk for TLS based on his kidney function and absolute lymphocyte count. -He received rasburicase last night prior to first dose of venetoclax.  He will continue allopurinol daily. -Tumor lysis labs this morning showed uric acid 2.1, down from 4.2.  Magnesium, phosphorus, potassium were normal. -We will check tumor lysis labs every 12 hours. -I will order labs this evening. -If the labs in a.m. remain at his baseline, we will be looking at potential discharge  tomorrow with close follow-up in the office..  3.  Macrocytic anemia: -Hemoglobin today decreased to 9.2 from 10.3, potentially from dilution from IV fluids. -There is a combination anemia from CKD and relative iron deficiency and CLL infiltration. -We will consider Feraheme weekly x2 in the office.  4.  CKD: -Kidney biopsy on 12/19/2017 shows  CLL involvement. -Creatinine today is stable around 5.45, 5.41 yesterday.  We will closely monitor.  All questions were answered. The patient knows to call the clinic with any problems, questions or concerns. We can certainly see the patient much sooner if necessary.     Derek Jack

## 2019-12-03 NOTE — Progress Notes (Signed)
PROGRESS NOTE    Andrew [name]  LXB:[id number] DOB: 1940/09/01 DOA: 12/02/2019 PCP: [name], MD   Chief complaint: Chemotherapy initiation with high risk for TLS.  Brief Narrative:  As per H&P written by Dr. [name] on 12/02/2019 79 y.o. male with medical history significant for CLL, CKD 4, ischemic cardiomyopathy, paroxysmal atrial fibrillation, subdural hematoma.  Patient was sent from the cancer center, directly admitted to the hospital for management of his CLL, and high risk for tumor lysis syndrome with venetoclax. On my evaluation, patient has no complaints, no cough, no difficulty breathing no vomiting no loose stools, no pain.   Review of Systems: As per HPI all other systems reviewed and negative.  Assessment & Plan: 1-CLL (chronic lymphocytic leukemia) (HCC) -Treatment of management as per oncology service -Has been started on venetoclax -WBC's Trending down  2-hx of Subdural hematoma (HCC) -no HA's -continue holding on anticoagulation and NSAID's  3-hx of paroxysmal A-fib (HCC) -rate control on b-blocker -no on anticoagulation due to hx of subdural hematoma   4-CKD (chronic kidney disease) stage 5, GFR less than 15 ml/min (HCC) -appears to be stable -continue outpatient follow up with nephrology -continue avoiding nephrotoxic agents   5-At high risk of tumor lysis syndrome -continue IVF's and follow uric acid level, potassium, phosphorus and magnesium.  6-anemia of CKD -Hemoglobin down to 9.2 from 10.3 on 10 7 from dilution effect.  IV fluids provided. -Continue to follow trend -IV iron to be provided as outpatient   7-BPH -no retention symptoms reported  -continue flomax   DVT prophylaxis: SCDs Code Status: Full code Family Communication: Wife at bedside Disposition:   Status is: Inpatient  Dispo: The patient is from: home              Anticipated d/c is to: home              Anticipated d/c date is: 12/04/19              Patient currently no  medically stable for discharge; still receiving acute management for TLS prophylaxis. Renal function and labs stable overall. Will follow oncology service rec's. actively receiving chemotherapy.       Consultants:   Oncology service   Procedures:  See below for x-ray reports.   Antimicrobials:  None   Subjective: Afebrile, in no acute distress.  Reports no chest pain or shortness of breath.  Objective: Vitals:   12/03/19 0500 12/03/19 0700 12/03/19 1138 12/03/19 1400  BP:      Pulse:  72 75   Resp:  19 (!) 22 19  Temp:   97.9 F (36.6 C)   TempSrc:   Oral   SpO2:  96% 96%   Weight: 100 kg     Height:        Intake/Output Summary (Last 24 hours) at 12/03/2019 1956 Last data filed at 12/03/2019 0751 Gross per 24 hour  Intake 1446.37 ml  Output 2850 ml  Net -1403.63 ml   Filed Weights   12/03/19 0500  Weight: 100 kg    Examination:  General exam: Appears calm and comfortable, no fever, no chest pain, no nausea, no vomiting.  In no acute distress.  Reports good urine output. Respiratory system: Clear to auscultation. Respiratory effort normal.  Good oxygen saturation on room air. Cardiovascular system: S1 & S2 heard, RRR. No JVD, murmurs, rubs, gallops or clicks. No pedal edema. Gastrointestinal system: Abdomen is nondistended, soft and nontender. No organomegaly or masses felt.  Normal bowel sounds heard. Central nervous system: Alert and oriented. No focal neurological deficits. Extremities: No cyanosis, no clubbing, no edema. Skin: No rashes, no petechiae Psychiatry: Judgement and insight appear normal. Mood & affect appropriate.     Data Reviewed: I have personally reviewed following labs and imaging studies  CBC: Recent Labs  Lab 12/02/19 1359 12/03/19 0345  WBC 36.2* 31.3*  NEUTROABS 3.9  --   HGB 10.0* 9.2*  HCT 32.2* 29.3*  MCV 103.2* 103.2*  PLT 83* 72*    Basic Metabolic Panel: Recent Labs  Lab 12/02/19 1359 12/03/19 0345 12/03/19 1839   NA 138 139  --   K 4.0 3.6  --   CL 109 110  --   CO2 21* 20*  --   GLUCOSE 100* 89  --   BUN 50* 50*  --   CREATININE 5.41* 5.45*  --   CALCIUM 8.8* 8.5*  --   MG 2.4 2.4  --   PHOS 3.8 3.6 3.9    GFR: Estimated Creatinine Clearance: 13.9 mL/min (A) (by C-G formula based on SCr of 5.45 mg/dL (H)).  Liver Function Tests: Recent Labs  Lab 12/02/19 1359 12/03/19 0345  AST 17 14*  ALT 14 11  ALKPHOS 51 42  BILITOT 0.6 0.7  PROT 6.6 5.8*  ALBUMIN 4.2 3.7    CBG: No results for input(s): GLUCAP in the last 168 hours.   Recent Results (from the past 240 hour(s))  MRSA PCR Screening     Status: Abnormal   Collection Time: 12/02/19  5:03 PM   Specimen: Nasal Mucosa; Nasopharyngeal  Result Value Ref Range Status   MRSA by PCR POSITIVE (A) NEGATIVE Final    Comment:        The GeneXpert MRSA Assay (FDA approved for NASAL specimens only), is one component of a comprehensive MRSA colonization surveillance program. It is not intended to diagnose MRSA infection nor to guide or monitor treatment for MRSA infections. RESULT CALLED TO, READ BACK BY AND VERIFIED WITH: WAGNOR,R@0615  BY MATTHEWS, B 7.7.21 Performed at [facility], [address], [place], [place] [address]       Radiology Studies: No results found.   Scheduled Meds:  allopurinol  300 mg Oral Daily   carvedilol  6.25 mg Oral BID   Chlorhexidine Gluconate Cloth  6 each Topical Daily   mupirocin ointment  1 application Nasal BID   sodium bicarbonate  650 mg Oral BID   venetoclax  10 mg Oral Daily   Continuous Infusions:  sodium chloride 100 mL/hr at 12/03/19 0751     LOS: 1 day    Time spent: 30 minutes    Barton Dubois, MD [facility]   To contact the attending provider between 7A-7P or the covering provider during after hours 7P-7A, please log into the web site [email or web address] and access using universal [place] password for that web site. If you do not have the password,  please call the hospital operator.  12/03/2019, 7:56 PM

## 2019-12-04 LAB — CBC
HCT: 29 % — ABNORMAL LOW (ref 39.0–52.0)
Hemoglobin: 9.2 g/dL — ABNORMAL LOW (ref 13.0–17.0)
MCH: 32.6 pg (ref 26.0–34.0)
MCHC: 31.7 g/dL (ref 30.0–36.0)
MCV: 102.8 fL — ABNORMAL HIGH (ref 80.0–100.0)
Platelets: 68 10*3/uL — ABNORMAL LOW (ref 150–400)
RBC: 2.82 MIL/uL — ABNORMAL LOW (ref 4.22–5.81)
RDW: 15.6 % — ABNORMAL HIGH (ref 11.5–15.5)
WBC: 26.4 10*3/uL — ABNORMAL HIGH (ref 4.0–10.5)
nRBC: 0 % (ref 0.0–0.2)

## 2019-12-04 LAB — COMPREHENSIVE METABOLIC PANEL
ALT: 11 U/L (ref 0–44)
AST: 13 U/L — ABNORMAL LOW (ref 15–41)
Albumin: 3.5 g/dL (ref 3.5–5.0)
Alkaline Phosphatase: 40 U/L (ref 38–126)
Anion gap: 10 (ref 5–15)
BUN: 51 mg/dL — ABNORMAL HIGH (ref 8–23)
CO2: 19 mmol/L — ABNORMAL LOW (ref 22–32)
Calcium: 8.4 mg/dL — ABNORMAL LOW (ref 8.9–10.3)
Chloride: 111 mmol/L (ref 98–111)
Creatinine, Ser: 5.24 mg/dL — ABNORMAL HIGH (ref 0.61–1.24)
GFR calc Af Amer: 11 mL/min — ABNORMAL LOW (ref >60)
GFR calc non Af Amer: 10 mL/min — ABNORMAL LOW (ref >60)
Glucose, Bld: 98 mg/dL (ref 70–99)
Potassium: 3.9 mmol/L (ref 3.5–5.1)
Sodium: 140 mmol/L (ref 135–145)
Total Bilirubin: 0.5 mg/dL (ref 0.3–1.2)
Total Protein: 5.5 g/dL — ABNORMAL LOW (ref 6.5–8.1)

## 2019-12-04 LAB — PHOSPHORUS: Phosphorus: 4.4 mg/dL (ref 2.5–4.6)

## 2019-12-04 LAB — MAGNESIUM: Magnesium: 2.3 mg/dL (ref 1.7–2.4)

## 2019-12-04 LAB — URIC ACID: Uric Acid, Serum: 0.8 mg/dL — ABNORMAL LOW (ref 3.7–8.6)

## 2019-12-04 NOTE — Plan of Care (Signed)
  Problem: Education: Goal: Knowledge of General Education information will improve Description Including pain rating scale, medication(s)/side effects and non-pharmacologic comfort measures Outcome: Progressing   Problem: Health Behavior/Discharge Planning: Goal: Ability to manage health-related needs will improve Outcome: Progressing   

## 2019-12-04 NOTE — Consult Note (Signed)
[facility] Oncology Progress Note  Name: Andrew [name]      MRN: [id number]    Location: A306/A306-01  Date: 12/04/2019 Time:5:31 PM   Subjective: Interval History:Andrew [name] seen this morning.  He is comfortably sitting in a chair.  Denied any shortness of breath.  Denies any nausea, vomiting or diarrhea.  Does not report any dyspnea on exertion or orthopnea.  Objective: Vital signs in last 24 hours: Temp:  [97.9 F (36.6 C)-98.5 F (36.9 C)] 98.5 F (36.9 C) (07/08 0505) Pulse Rate:  [67-77] 67 (07/08 0505) Resp:  [20] 20 (07/08 0505) BP: (113-150)/(74-96) 113/74 (07/08 0505) SpO2:  [96 %-97 %] 96 % (07/08 1433)    Intake/Output from previous day: No intake/output data recorded.    Intake/Output this shift: No intake/output data recorded.   PHYSICAL EXAM: BP 113/74 (BP Location: Right Arm)   Pulse 67   Temp 98.5 F (36.9 C) (Oral)   Resp 20   Ht 6\' 2"  (1.88 m)   Wt 220 lb 7.4 oz (100 kg)   SpO2 96%   BMI 28.31 kg/m  Lungs: clear to auscultation bilaterally Heart: irregularly irregular rhythm Extremities: No edema or cyanosis. Skin: Skin color, texture, turgor normal. No rashes or lesions Neurologic: Grossly normal   Studies/Results: Results for orders placed or performed during the hospital encounter of 12/02/19 (from the past 48 hour(s))  Magnesium     Status: None   Collection Time: 12/03/19  3:45 AM  Result Value Ref Range   Magnesium 2.4 1.7 - 2.4 mg/dL    Comment: Performed at [facility], [address] [place], [place] [address]  Phosphorus     Status: None   Collection Time: 12/03/19  3:45 AM  Result Value Ref Range   Phosphorus 3.6 2.5 - 4.6 mg/dL    Comment: Performed at [facility], [address], [place], [place] [address]  Uric acid     Status: Abnormal   Collection Time: 12/03/19  3:45 AM  Result Value Ref Range   Uric Acid, Serum 2.1 (L) 3.7 - 8.6 mg/dL    Comment: Performed at [facility], [address] [place],  [place] [address]  Comprehensive metabolic panel     Status: Abnormal   Collection Time: 12/03/19  3:45 AM  Result Value Ref Range   Sodium 139 135 - 145 mmol/L   Potassium 3.6 3.5 - 5.1 mmol/L   Chloride 110 98 - 111 mmol/L   CO2 20 (L) 22 - 32 mmol/L   Glucose, Bld 89 70 - 99 mg/dL    Comment: Glucose reference range applies only to samples taken after fasting for at least 8 hours.   BUN 50 (H) 8 - 23 mg/dL   Creatinine, Ser 5.45 (H) 0.61 - 1.24 mg/dL   Calcium 8.5 (L) 8.9 - 10.3 mg/dL   Total Protein 5.8 (L) 6.5 - 8.1 g/dL   Albumin 3.7 3.5 - 5.0 g/dL   AST 14 (L) 15 - 41 U/L   ALT 11 0 - 44 U/L   Alkaline Phosphatase 42 38 - 126 U/L   Total Bilirubin 0.7 0.3 - 1.2 mg/dL   GFR calc non Af Amer 9 (L) >60 mL/min   GFR calc Af Amer 11 (L) >60 mL/min   Anion gap 9 5 - 15    Comment: Performed at [facility], [address] [place], [place] [address]  CBC     Status: Abnormal   Collection Time: 12/03/19  3:45  AM  Result Value Ref Range   WBC 31.3 (H) 4.0 - 10.5 K/uL   RBC 2.84 (L) 4.22 - 5.81 MIL/uL   Hemoglobin 9.2 (L) 13.0 - 17.0 g/dL   HCT 29.3 (L) 39 - 52 %   MCV 103.2 (H) 80.0 - 100.0 fL   MCH 32.4 26.0 - 34.0 pg   MCHC 31.4 30.0 - 36.0 g/dL   RDW 15.7 (H) 11.5 - 15.5 %   Platelets 72 (L) 150 - 400 K/uL    Comment: SPECIMEN CHECKED FOR CLOTS CONSISTENT WITH PREVIOUS RESULT Immature Platelet Fraction may be clinically indicated, consider ordering this additional test [id number]    nRBC 0.0 0.0 - 0.2 %    Comment: Performed at [facility], [address] [place], [place] [address]  Basic metabolic panel     Status: Abnormal   Collection Time: 12/03/19  6:39 PM  Result Value Ref Range   Sodium 138 135 - 145 mmol/L   Potassium 4.4 3.5 - 5.1 mmol/L    Comment: DELTA CHECK NOTED   Chloride 109 98 - 111 mmol/L   CO2 21 (L) 22 - 32 mmol/L   Glucose, Bld 114 (H) 70 - 99 mg/dL    Comment: Glucose reference range applies only to samples taken after fasting for at least 8 hours.    BUN 52 (H) 8 - 23 mg/dL   Creatinine, Ser 5.36 (H) 0.61 - 1.24 mg/dL   Calcium 8.7 (L) 8.9 - 10.3 mg/dL   GFR calc non Af Amer 9 (L) >60 mL/min   GFR calc Af Amer 11 (L) >60 mL/min   Anion gap 8 5 - 15    Comment: Performed at [facility], [address] [place], [place] [address]  Uric acid     Status: Abnormal   Collection Time: 12/03/19  6:39 PM  Result Value Ref Range   Uric Acid, Serum 0.7 (L) 3.7 - 8.6 mg/dL    Comment: Performed at [facility], [address] [place], [place] [address]  Magnesium     Status: None   Collection Time: 12/03/19  6:39 PM  Result Value Ref Range   Magnesium 2.3 1.7 - 2.4 mg/dL    Comment: Performed at [facility], [address], [place], [place] [address]  Phosphorus     Status: None   Collection Time: 12/03/19  6:39 PM  Result Value Ref Range   Phosphorus 3.9 2.5 - 4.6 mg/dL    Comment: Performed at [facility], [address], [place], [place] [address]  Magnesium     Status: None   Collection Time: 12/04/19  3:50 AM  Result Value Ref Range   Magnesium 2.3 1.7 - 2.4 mg/dL    Comment: Performed at [facility], [address], [place], [place] [address]  Phosphorus     Status: None   Collection Time: 12/04/19  3:50 AM  Result Value Ref Range   Phosphorus 4.4 2.5 - 4.6 mg/dL    Comment: Performed at [facility], [address] [place], [place] [address]  Uric acid     Status: Abnormal   Collection Time: 12/04/19  3:50 AM  Result Value Ref Range   Uric Acid, Serum 0.8 (L) 3.7 - 8.6 mg/dL    Comment: Performed at [facility], [address] [place], [place] [address]  Comprehensive metabolic panel     Status: Abnormal   Collection Time: 12/04/19  3:50 AM  Result Value Ref Range   Sodium 140 135 -  145 mmol/L   Potassium 3.9 3.5 - 5.1 mmol/L   Chloride 111 98 - 111 mmol/L   CO2 19 (L) 22 - 32 mmol/L   Glucose, Bld 98 70 - 99 mg/dL    Comment: Glucose reference range applies only to samples taken after fasting for at least 8 hours.   BUN  51 (H) 8 - 23 mg/dL   Creatinine, Ser 5.24 (H) 0.61 - 1.24 mg/dL   Calcium 8.4 (L) 8.9 - 10.3 mg/dL   Total Protein 5.5 (L) 6.5 - 8.1 g/dL   Albumin 3.5 3.5 - 5.0 g/dL   AST 13 (L) 15 - 41 U/L   ALT 11 0 - 44 U/L   Alkaline Phosphatase 40 38 - 126 U/L   Total Bilirubin 0.5 0.3 - 1.2 mg/dL   GFR calc non Af Amer 10 (L) >60 mL/min   GFR calc Af Amer 11 (L) >60 mL/min   Anion gap 10 5 - 15    Comment: Performed at [facility], [address], [place], [place] [address]  CBC     Status: Abnormal   Collection Time: 12/04/19  3:50 AM  Result Value Ref Range   WBC 26.4 (H) 4.0 - 10.5 K/uL   RBC 2.82 (L) 4.22 - 5.81 MIL/uL   Hemoglobin 9.2 (L) 13.0 - 17.0 g/dL   HCT 29.0 (L) 39 - 52 %   MCV 102.8 (H) 80.0 - 100.0 fL   MCH 32.6 26.0 - 34.0 pg   MCHC 31.7 30.0 - 36.0 g/dL   RDW 15.6 (H) 11.5 - 15.5 %   Platelets 68 (L) 150 - 400 K/uL    Comment: SPECIMEN CHECKED FOR CLOTS Immature Platelet Fraction may be clinically indicated, consider ordering this additional test [id number] PLATELET COUNT CONFIRMED BY SMEAR    nRBC 0.0 0.0 - 0.2 %    Comment: Performed at [facility], [address], [place], [place] [address]   No results found.   MEDICATIONS: I have reviewed the patient's current medications.     Assessment/Plan:  1.  CLL: -Continue venetoclax 10 mg daily. -I reviewed labs.  White count improved to 26.  Hemoglobin stable at 9.2.  Platelets are 68. -We will check CBC in the morning.  I think he will be ready for discharge tomorrow if the white count continues to trend down and tumor lysis labs are improving.  2.  Tumor lysis prophylaxis: -Continue allopurinol.  He received rasburicase on day 1. -Creatinine improved to 5.24 from 5.45 on admission. -Magnesium, phosphorus, potassium and uric acid are within normal limits. -We will monitor tumor lysis labs in the morning. -Decrease IV fluids to 75 mL/h.  3.  Macrocytic anemia: -Hemoglobin today stable at 9.2. -We will give  Feraheme weekly x2 in the office.  4.  CKD: -Kidney biopsy on 12/19/2017 consistent with CLL involvement. -Creatinine slightly improved to 5.2 today.  All questions were answered. The patient knows to call the clinic with any problems, questions or concerns. We can certainly see the patient much sooner if necessary.    Derek Jack

## 2019-12-04 NOTE — Progress Notes (Signed)
PROGRESS NOTE    Andrew [name]  IRS:[id number] DOB: 20-Jul-1940 DOA: 12/02/2019 PCP: [name], MD   Chief complaint: Chemotherapy initiation with high risk for TLS.  Brief Narrative:  As per H&P written by Dr. [name] on 12/02/2019 78 y.o. male with medical history significant for CLL, CKD 4, ischemic cardiomyopathy, paroxysmal atrial fibrillation, subdural hematoma.  Patient was sent from the cancer center, directly admitted to the hospital for management of his CLL, and high risk for tumor lysis syndrome with venetoclax. On my evaluation, patient has no complaints, no cough, no difficulty breathing no vomiting no loose stools, no pain.   Review of Systems: As per HPI all other systems reviewed and negative.  Assessment & Plan: 1-CLL (chronic lymphocytic leukemia) (HCC) -Treatment of management as per oncology service -Has been started on venetoclax -WBC's Trending down  2-hx of Subdural hematoma (HCC) -no HA's -continue holding on anticoagulation and NSAID's  3-hx of paroxysmal A-fib (HCC) -rate control on b-blocker -no on anticoagulation due to hx of subdural hematoma   4-CKD (chronic kidney disease) stage 5, GFR less than 15 ml/min (HCC) -appears to be stable -continue outpatient follow up with nephrology -continue avoiding nephrotoxic agents  -Cr down to 5.24 -continue IVF's at adjusted rate  5-At high risk of tumor lysis syndrome -continue IVF's and follow uric acid level, potassium, phosphorus and magnesium.  6-anemia of CKD -Hemoglobin down to 9.2 from 10.3 on admission,  from dilution effect most likely.   -Continue to follow trend -IV iron to be provided as outpatient   7-BPH -no retention symptoms reported  -continue flomax   DVT prophylaxis: SCDs Code Status: Full code Family Communication: Wife at bedside Disposition:   Status is: Inpatient  Dispo: The patient is from: home              Anticipated d/c is to: home              Anticipated d/c  date is: 12/05/19              Patient currently no medically stable for discharge; still receiving acute management for TLS prophylaxis. Renal function and labs stable overall. Will follow oncology service rec's. actively receiving chemotherapy.       Consultants:   Oncology service   Procedures:  See below for x-ray reports.   Antimicrobials:  None   Subjective: No overnight events.  No chest pain, no nausea, no vomiting, reports no shortness of breath.  Good urine output.  Objective: Vitals:   12/03/19 1400 12/03/19 2151 12/04/19 0220 12/04/19 0505  BP:  (!) 144/96 136/85 113/74  Pulse:  77 74 67  Resp: 19 20 20 20   Temp:  98.5 F (36.9 C) 97.9 F (36.6 C) 98.5 F (36.9 C)  TempSrc:  Oral Oral Oral  SpO2:  96% 96% 96%  Weight:      Height:       No intake or output data in the 24 hours ending 12/04/19 1133 Filed Weights   12/03/19 0500  Weight: 100 kg    Examination: General exam: Alert, awake, oriented x 3, afebrile, no chest pain, no nausea, no vomiting.  In no acute distress. Respiratory system: Clear to auscultation. Respiratory effort normal. Cardiovascular system: Rate controlled, no rubs, no gallops, no murmurs, no JVD. Gastrointestinal system: Abdomen is nondistended, soft and nontender. No organomegaly or masses felt. Normal bowel sounds heard. Central nervous system: Alert and oriented. No focal neurological deficits. Extremities: No cyanosis or clubbing.  Skin: No rashes, no petechiae. Psychiatry: Judgement and insight appear normal. Mood & affect appropriate.    Data Reviewed: I have personally reviewed following labs and imaging studies  CBC: Recent Labs  Lab 12/02/19 1359 12/03/19 0345 12/04/19 0350  WBC 36.2* 31.3* 26.4*  NEUTROABS 3.9  --   --   HGB 10.0* 9.2* 9.2*  HCT 32.2* 29.3* 29.0*  MCV 103.2* 103.2* 102.8*  PLT 83* 72* 68*    Basic Metabolic Panel: Recent Labs  Lab 12/02/19 1359 12/03/19 0345 12/03/19 1839  12/04/19 0350  NA 138 139 138 140  K 4.0 3.6 4.4 3.9  CL 109 110 109 111  CO2 21* 20* 21* 19*  GLUCOSE 100* 89 114* 98  BUN 50* 50* 52* 51*  CREATININE 5.41* 5.45* 5.36* 5.24*  CALCIUM 8.8* 8.5* 8.7* 8.4*  MG 2.4 2.4 2.3 2.3  PHOS 3.8 3.6 3.9 4.4    GFR: Estimated Creatinine Clearance: 14.4 mL/min (A) (by C-G formula based on SCr of 5.24 mg/dL (H)).  Liver Function Tests: Recent Labs  Lab 12/02/19 1359 12/03/19 0345 12/04/19 0350  AST 17 14* 13*  ALT 14 11 11   ALKPHOS 51 42 40  BILITOT 0.6 0.7 0.5  PROT 6.6 5.8* 5.5*  ALBUMIN 4.2 3.7 3.5    CBG: No results for input(s): GLUCAP in the last 168 hours.   Recent Results (from the past 240 hour(s))  MRSA PCR Screening     Status: Abnormal   Collection Time: 12/02/19  5:03 PM   Specimen: Nasal Mucosa; Nasopharyngeal  Result Value Ref Range Status   MRSA by PCR POSITIVE (A) NEGATIVE Final    Comment:        The GeneXpert MRSA Assay (FDA approved for NASAL specimens only), is one component of a comprehensive MRSA colonization surveillance program. It is not intended to diagnose MRSA infection nor to guide or monitor treatment for MRSA infections. RESULT CALLED TO, READ BACK BY AND VERIFIED WITH: WAGNOR,R@0615  BY MATTHEWS, B 7.7.21 Performed at [facility], [address] [place], [place] [address]       Radiology Studies: No results found.   Scheduled Meds: . allopurinol  300 mg Oral Daily  . carvedilol  6.25 mg Oral BID  . Chlorhexidine Gluconate Cloth  6 each Topical Daily  . mupirocin ointment  1 application Nasal BID  . sodium bicarbonate  650 mg Oral BID  . tamsulosin  0.4 mg Oral Daily  . venetoclax  10 mg Oral Daily   Continuous Infusions: . sodium chloride 75 mL/hr at 12/04/19 0901     LOS: 2 days    Time spent: 30 minutes    Barton Dubois, MD [facility]   To contact the attending provider between 7A-7P or the covering provider during after hours 7P-7A, please log into the  web site [email or web address] and access using universal [place] password for that web site. If you do not have the password, please call the hospital operator.  12/04/2019, 11:33 AM

## 2019-12-05 DIAGNOSIS — I480 Paroxysmal atrial fibrillation: Secondary | ICD-10-CM

## 2019-12-05 DIAGNOSIS — S065X9A Traumatic subdural hemorrhage with loss of consciousness of unspecified duration, initial encounter: Secondary | ICD-10-CM

## 2019-12-05 LAB — COMPREHENSIVE METABOLIC PANEL
ALT: 9 U/L (ref 0–44)
AST: 13 U/L — ABNORMAL LOW (ref 15–41)
Albumin: 3.3 g/dL — ABNORMAL LOW (ref 3.5–5.0)
Alkaline Phosphatase: 38 U/L (ref 38–126)
Anion gap: 6 (ref 5–15)
BUN: 52 mg/dL — ABNORMAL HIGH (ref 8–23)
CO2: 21 mmol/L — ABNORMAL LOW (ref 22–32)
Calcium: 8.2 mg/dL — ABNORMAL LOW (ref 8.9–10.3)
Chloride: 113 mmol/L — ABNORMAL HIGH (ref 98–111)
Creatinine, Ser: 4.99 mg/dL — ABNORMAL HIGH (ref 0.61–1.24)
GFR calc Af Amer: 12 mL/min — ABNORMAL LOW (ref >60)
GFR calc non Af Amer: 10 mL/min — ABNORMAL LOW (ref >60)
Glucose, Bld: 97 mg/dL (ref 70–99)
Potassium: 4 mmol/L (ref 3.5–5.1)
Sodium: 140 mmol/L (ref 135–145)
Total Bilirubin: 0.6 mg/dL (ref 0.3–1.2)
Total Protein: 5.4 g/dL — ABNORMAL LOW (ref 6.5–8.1)

## 2019-12-05 LAB — CBC
HCT: 28.2 % — ABNORMAL LOW (ref 39.0–52.0)
Hemoglobin: 8.8 g/dL — ABNORMAL LOW (ref 13.0–17.0)
MCH: 32.1 pg (ref 26.0–34.0)
MCHC: 31.2 g/dL (ref 30.0–36.0)
MCV: 102.9 fL — ABNORMAL HIGH (ref 80.0–100.0)
Platelets: 65 K/uL — ABNORMAL LOW (ref 150–400)
RBC: 2.74 MIL/uL — ABNORMAL LOW (ref 4.22–5.81)
RDW: 15.7 % — ABNORMAL HIGH (ref 11.5–15.5)
WBC: 23.6 K/uL — ABNORMAL HIGH (ref 4.0–10.5)
nRBC: 0.1 % (ref 0.0–0.2)

## 2019-12-05 LAB — URIC ACID: Uric Acid, Serum: 1.2 mg/dL — ABNORMAL LOW (ref 3.7–8.6)

## 2019-12-05 LAB — PHOSPHORUS: Phosphorus: 4.1 mg/dL (ref 2.5–4.6)

## 2019-12-05 LAB — MAGNESIUM: Magnesium: 2.1 mg/dL (ref 1.7–2.4)

## 2019-12-05 MED ORDER — VENETOCLAX 10 MG PO TABS: 10.0000 mg | ORAL_TABLET | Freq: Every day | ORAL | Status: DC

## 2019-12-05 NOTE — Discharge Summary (Signed)
Physician Discharge Summary  Andrew [name] LNL:[id number] DOB: 1940/12/05 DOA: 12/02/2019  PCP: [name], MD  Admit date: 12/02/2019 Discharge date: 12/05/2019  Time spent: 35 minutes  Recommendations for Outpatient Follow-up:  1. Repeat basic metabolic panel to follow electrolytes and renal function 2. Continue to follow tumor lysis blood work to assess stability 3. -Repeat CBC to follow hemoglobin and platelets count   Discharge Diagnoses:  Principal Problem:   CLL (chronic lymphocytic leukemia) (HCC) Active Problems:   Subdural hematoma (HCC)   A-fib (HCC)   CKD (chronic kidney disease) stage 5, GFR less than 15 ml/min (HCC)   At high risk of tumor lysis syndrome   Macrocytic anemia   Discharge Condition: Stable and improved.  Patient discharged home with instruction to follow-up with oncology service and PCP after discharge.  CODE STATUS: Full code  Diet recommendation: Heart healthy/low-sodium diet.  Filed Weights   12/03/19 0500  Weight: 100 kg    History of present illness:  As per H&P written by Dr. [name] on 12/02/2019 79 y.o.malewith medical history significant forCLL, CKD 4, ischemic cardiomyopathy, paroxysmal atrial fibrillation, subdural hematoma. Patient was sent from the cancer center,directly admitted to the hospital for management of his CLL, and high risk for tumor lysis syndrome with venetoclax. On my evaluation, patient has no complaints, no cough, no difficulty breathing no vomiting no loose stools, no pain.  Review of Systems: As per HPI all other systems reviewed and negative.  Hospital Course:  1-CLL (chronic lymphocytic leukemia) ([place]) -Has been started on venetoclax -WBC's Trending down appropriately -Continue outpatient follow-up with oncology service.  2-hx of Subdural hematoma (HCC) -no HA's or complaints currently. -continue holding on anticoagulation and NSAID's  3-hx of paroxysmal A-fib (HCC) -rate control on  b-blocker -no on anticoagulation due to hx of subdural hematoma   4-CKD (chronic kidney disease) stage 5, GFR less than 15 ml/min (HCC) -appears to be stable, and a slightly better than her baseline. -continue outpatient follow up with nephrology -continue avoiding nephrotoxic agents  -Cr down to 4.99 -continue to maintain adequate hydration and to follow low-sodium diet.  5-At high risk of tumor lysis syndrome -continue good hydration and oral intake -Continue to follow tumor lysis blood work as an outpatient -Continue allopurinol.  6-anemia of CKD/macrocytic anemia -Hemoglobin down to 8.8 from 10.3 on admission,  from dilution effect most likely.   -No overt bleeding appreciated. -Continue to follow hemoglobin trend as an outpatient. -IV iron to be provided as outpatient by oncology service. -Continue 1000 mcg of B12 on daily basis  7-BPH -no retention symptoms reported  -continue flomax  8-thrombocytopenia -Chronic and overall stable -No transfusion of platelets needed during hospitalization -No operative bleeding -Follow platelets count as an outpatient.  Procedures: See below for x-ray reports.   Consultations:  Oncology service.  Discharge Exam: Vitals:   12/04/19 2108 12/05/19 0537  BP: (!) 145/96 (!) 134/99  Pulse: 70 72  Resp: 20 20  Temp: 98.3 F (36.8 C) 97.8 F (36.6 C)  SpO2: 96% 96%    General: Afebrile, no chest pain, no nausea, no vomiting.  In no acute distress.  Feeling ready to go home. Cardiovascular: Rate controlled, no rubs, no gallops, no murmurs, no JVD. Respiratory: Clear to auscultation bilaterally.  Normal respiratory effort.  Good oxygen saturation on room air. Abdomen: Soft, nontender, nondistended, positive bowel sounds Extremities: No cyanosis, no clubbing.  Discharge Instructions   Discharge Instructions    Diet - low sodium heart healthy  Complete by: As directed    Discharge instructions   Complete by: As directed     Maintain adequate hydration I discussed with oncology service Take medications as prescribed Follow heart healthy diet Check weight on daily basis Outpatient follow-up with nephrology service as previously instructed Follow-up with oncology service as a schedule on 12/08/2019.     Allergies as of 12/05/2019   No Known Allergies     Medication List    STOP taking these medications   CoQ-10 200 MG Caps   diphenhydramine-acetaminophen 25-500 MG Tabs tablet Commonly known as: TYLENOL PM   hydrALAZINE 25 MG tablet Commonly known as: APRESOLINE   venetoclax 10 & 50 & 100 MG Tbpk Replaced by: venetoclax 10 MG Tabs     TAKE these medications   allopurinol 300 MG tablet Commonly known as: ZYLOPRIM Take 1 tablet (300 mg total) by mouth daily.   carvedilol 6.25 MG tablet Commonly known as: COREG Take 1 tablet (6.25 mg total) by mouth 2 (two) times daily.   multivitamin with minerals Tabs tablet Take 1 tablet by mouth daily.   omeprazole 20 MG capsule Commonly known as: PRILOSEC 1 PO 30 MINS PRIOR TO BREAKFAST.   prochlorperazine 10 MG tablet Commonly known as: COMPAZINE Take 1 tablet (10 mg total) by mouth every 6 (six) hours as needed for nausea or vomiting.   sodium bicarbonate 650 MG tablet Take 1 tablet (650 mg total) by mouth 2 (two) times daily. What changed: how much to take   tamsulosin 0.4 MG Caps capsule Commonly known as: FLOMAX Take 0.4 mg by mouth daily.   venetoclax 10 MG Tabs Take 10 mg by mouth daily. Start taking on: December 06, 2019 Replaces: venetoclax 10 & 50 & 100 MG Tbpk   Vitamin B12 1000 MCG Tbcr Take 1,000 mcg by mouth daily.      No Known Allergies  Follow-up Information    [name], MD. Schedule an appointment as soon as possible for a visit in 2 week(s).   Specialty: Internal Medicine Contact information: [place] [address] [phone]        [name], MD .   Specialty: Cardiology Contact  information: [address] [place] [address] [phone]               The results of significant diagnostics from this hospitalization (including imaging, microbiology, ancillary and laboratory) are listed below for reference.    Significant Diagnostic Studies: NM PET Image Initial (PI) Skull Base To Thigh  Result Date: 12/02/2019 CLINICAL DATA:  Subsequent treatment strategy for chronic lymphocytic leukemia. EXAM: NUCLEAR MEDICINE PET SKULL BASE TO THIGH TECHNIQUE: 12.6 mCi F-18 FDG was injected intravenously. Full-ring PET imaging was performed from the skull base to thigh after the radiotracer. CT data was obtained and used for attenuation correction and anatomic localization. Fasting blood glucose: 84 mg/dl COMPARISON:  PET-CT 11/04/2018 FINDINGS: Mediastinal blood pool activity: SUV max 1.8 Liver activity: SUV max 2.9 NECK: Again demonstrated multiple bilateral enlarged cervical lymph nodes. Nodes appears slightly larger than prior. For example LEFT level III lymph node (image 33) measures 12 mm compares to 7 mm on PET-CT 11/04/2018. Lesion has low metabolic activity SUV max equal 2.1. RIGHT level I lymph node on the RIGHT measures 12 mm compared to 8 mm (46/3) with SUV max equal 2.3. Incidental CT findings: none CHEST: Bilateral axillary and mediastinal adenopathy. Mediastinal lymph nodes increased in size moderately. For example prevascular node  measuring 12 mm (image 88) compares to 10 mm. LEFT lower paratracheal node measures 12 mm compared with 7 mm. Lymph nodes have low metabolic activity similar to or less background blood pool. Axial lymph nodes are slightly enlarged with SUV max equal 1.9 compared to 1.3 on comparison scan Incidental CT findings: No suspicious nodularity. ABDOMEN/PELVIS: Minimal adenopathy in the retroperitoneum along the aorta. External iliac nodes are prominent. Example RIGHT external iliac lymph node measuring 14 mm compares to 13 mm. SUV max equal 2.0  Spleen is enlarged measures 16 cm craniocaudad dimension compared to 14.5 cm on prior no abnormal hypermetabolic activity. Incidental CT findings: Bilateral nonobstructing renal calculi. Prostate normal SKELETON: No abnormal activity in bone marrow. Incidental CT findings: none IMPRESSION: 1. Mild increase in size of lymph nodes in the neck, axilla, and mediastinum accompanied by mild increase in metabolic activity. Activity is ranges from less than to slightly above background blood ( [place] 2/3) 2. Adenopathy in the abdomen and pelvis is similar. 3. Mild increase in volume of the spleen which has normal metabolic activity. Electronically Signed   By: Suzy Bouchard M.D.   On: 12/02/2019 08:16    Microbiology: Recent Results (from the past 240 hour(s))  MRSA PCR Screening     Status: Abnormal   Collection Time: 12/02/19  5:03 PM   Specimen: Nasal Mucosa; Nasopharyngeal  Result Value Ref Range Status   MRSA by PCR POSITIVE (A) NEGATIVE Final    Comment:        The GeneXpert MRSA Assay (FDA approved for NASAL specimens only), is one component of a comprehensive MRSA colonization surveillance program. It is not intended to diagnose MRSA infection nor to guide or monitor treatment for MRSA infections. RESULT CALLED TO, READ BACK BY AND VERIFIED WITH: [email or web address]  BY MATTHEWS, B 7.7.21 Performed at [facility], [address], [place], [place] [address]      Labs: Basic Metabolic Panel: Recent Labs  Lab 12/02/19 1359 12/03/19 0345 12/03/19 1839 12/04/19 0350 12/05/19 0417  NA 138 139 138 140 140  K 4.0 3.6 4.4 3.9 4.0  CL 109 110 109 111 113*  CO2 21* 20* 21* 19* 21*  GLUCOSE 100* 89 114* 98 97  BUN 50* 50* 52* 51* 52*  CREATININE 5.41* 5.45* 5.36* 5.24* 4.99*  CALCIUM 8.8* 8.5* 8.7* 8.4* 8.2*  MG 2.4 2.4 2.3 2.3 2.1  PHOS 3.8 3.6 3.9 4.4 4.1   Liver Function Tests: Recent Labs  Lab 12/02/19 1359 12/03/19 0345 12/04/19 0350 12/05/19 0417  AST 17 14* 13* 13*   ALT 14 11 11 9   ALKPHOS 51 42 40 38  BILITOT 0.6 0.7 0.5 0.6  PROT 6.6 5.8* 5.5* 5.4*  ALBUMIN 4.2 3.7 3.5 3.3*   CBC: Recent Labs  Lab 12/02/19 1359 12/03/19 0345 12/04/19 0350 12/05/19 0417  WBC 36.2* 31.3* 26.4* 23.6*  NEUTROABS 3.9  --   --   --   HGB 10.0* 9.2* 9.2* 8.8*  HCT 32.2* 29.3* 29.0* 28.2*  MCV 103.2* 103.2* 102.8* 102.9*  PLT 83* 72* 68* 65*    Signed:  Barton Dubois MD.  [facility] 12/05/2019, 8:50 AM

## 2019-12-05 NOTE — Consult Note (Signed)
[facility] Oncology Progress Note  Name: Andrew [name]      MRN: [id number]    Location: A306/A306-01  Date: 12/05/2019 Time:8:05 AM   Subjective: Interval History:Andrew [name] seen this morning sitting by the side of the bed.  He had an uneventful night.  Denied any nausea vomiting, diarrhea or constipation.  No musculoskeletal pains reported.  Objective: Vital signs in last 24 hours: Temp:  [97.8 F (36.6 C)-98.3 F (36.8 C)] 97.8 F (36.6 C) (07/09 0537) Pulse Rate:  [70-72] 72 (07/09 0537) Resp:  [20] 20 (07/09 0537) BP: (134-145)/(96-99) 134/99 (07/09 0537) SpO2:  [96 %] 96 % (07/09 0537)    Intake/Output from previous day: 07/08 0800 - 07/09 0759 In: 1793.6 [P.O.:240; I.V.:1553.6] Out: -     Intake/Output this shift: No intake/output data recorded.   PHYSICAL EXAM: BP (!) 134/99 (BP Location: Right Arm)   Pulse 72   Temp 97.8 F (36.6 C) (Oral)   Resp 20   Ht 6\' 2"  (1.88 m)   Wt 220 lb 7.4 oz (100 kg)   SpO2 96%   BMI 28.31 kg/m  General appearance: alert, cooperative and appears stated age Lungs: clear to auscultation bilaterally Heart: irregularly irregular rhythm Abdomen: Soft, nontender, no palpable masses. Extremities: No edema or cyanosis. Skin: Skin color, texture, turgor normal. No rashes or lesions Neurologic: Grossly normal   Studies/Results: Results for orders placed or performed during the hospital encounter of 12/02/19 (from the past 48 hour(s))  Basic metabolic panel     Status: Abnormal   Collection Time: 12/03/19  6:39 PM  Result Value Ref Range   Sodium 138 135 - 145 mmol/L   Potassium 4.4 3.5 - 5.1 mmol/L    Comment: DELTA CHECK NOTED   Chloride 109 98 - 111 mmol/L   CO2 21 (L) 22 - 32 mmol/L   Glucose, Bld 114 (H) 70 - 99 mg/dL    Comment: Glucose reference range applies only to samples taken after fasting for at least 8 hours.   BUN 52 (H) 8 - 23 mg/dL   Creatinine, Ser 5.36 (H) 0.61 - 1.24 mg/dL   Calcium 8.7 (L) 8.9  - 10.3 mg/dL   GFR calc non Af Amer 9 (L) >60 mL/min   GFR calc Af Amer 11 (L) >60 mL/min   Anion gap 8 5 - 15    Comment: Performed at [facility], [address] [place], [place] [address]  Uric acid     Status: Abnormal   Collection Time: 12/03/19  6:39 PM  Result Value Ref Range   Uric Acid, Serum 0.7 (L) 3.7 - 8.6 mg/dL    Comment: Performed at [facility], [address] [place], [place] [address]  Magnesium     Status: None   Collection Time: 12/03/19  6:39 PM  Result Value Ref Range   Magnesium 2.3 1.7 - 2.4 mg/dL    Comment: Performed at [facility], [address] [place], [place] [address]  Phosphorus     Status: None   Collection Time: 12/03/19  6:39 PM  Result Value Ref Range   Phosphorus 3.9 2.5 - 4.6 mg/dL    Comment: Performed at [facility], [address], [place], [place] [address]  Magnesium     Status: None   Collection Time: 12/04/19  3:50 AM  Result Value Ref Range   Magnesium 2.3 1.7 - 2.4 mg/dL    Comment: Performed at [facility], [address] [place], [place]  [address]  Phosphorus     Status: None   Collection Time: 12/04/19  3:50 AM  Result Value Ref Range   Phosphorus 4.4 2.5 - 4.6 mg/dL    Comment: Performed at [facility], [address] [place], [place] [address]  Uric acid     Status: Abnormal   Collection Time: 12/04/19  3:50 AM  Result Value Ref Range   Uric Acid, Serum 0.8 (L) 3.7 - 8.6 mg/dL    Comment: Performed at [facility], [address] [place], [place] [address]  Comprehensive metabolic panel     Status: Abnormal   Collection Time: 12/04/19  3:50 AM  Result Value Ref Range   Sodium 140 135 - 145 mmol/L   Potassium 3.9 3.5 - 5.1 mmol/L   Chloride 111 98 - 111 mmol/L   CO2 19 (L) 22 - 32 mmol/L   Glucose, Bld 98 70 - 99 mg/dL    Comment: Glucose reference range applies only to samples taken after fasting for at least 8 hours.   BUN 51 (H) 8 - 23 mg/dL   Creatinine, Ser 5.24 (H) 0.61 - 1.24 mg/dL   Calcium 8.4 (L) 8.9 -  10.3 mg/dL   Total Protein 5.5 (L) 6.5 - 8.1 g/dL   Albumin 3.5 3.5 - 5.0 g/dL   AST 13 (L) 15 - 41 U/L   ALT 11 0 - 44 U/L   Alkaline Phosphatase 40 38 - 126 U/L   Total Bilirubin 0.5 0.3 - 1.2 mg/dL   GFR calc non Af Amer 10 (L) >60 mL/min   GFR calc Af Amer 11 (L) >60 mL/min   Anion gap 10 5 - 15    Comment: Performed at [facility], [address], [place], [place] [address]  CBC     Status: Abnormal   Collection Time: 12/04/19  3:50 AM  Result Value Ref Range   WBC 26.4 (H) 4.0 - 10.5 K/uL   RBC 2.82 (L) 4.22 - 5.81 MIL/uL   Hemoglobin 9.2 (L) 13.0 - 17.0 g/dL   HCT 29.0 (L) 39 - 52 %   MCV 102.8 (H) 80.0 - 100.0 fL   MCH 32.6 26.0 - 34.0 pg   MCHC 31.7 30.0 - 36.0 g/dL   RDW 15.6 (H) 11.5 - 15.5 %   Platelets 68 (L) 150 - 400 K/uL    Comment: SPECIMEN CHECKED FOR CLOTS Immature Platelet Fraction may be clinically indicated, consider ordering this additional test [id number] PLATELET COUNT CONFIRMED BY SMEAR    nRBC 0.0 0.0 - 0.2 %    Comment: Performed at [facility], [address] [place], [place] [address]  Magnesium     Status: None   Collection Time: 12/05/19  4:17 AM  Result Value Ref Range   Magnesium 2.1 1.7 - 2.4 mg/dL    Comment: Performed at [facility], [address] [place], [place] [address]  Phosphorus     Status: None   Collection Time: 12/05/19  4:17 AM  Result Value Ref Range   Phosphorus 4.1 2.5 - 4.6 mg/dL    Comment: Performed at [facility], [address], [place], [place] [address]  Uric acid     Status: Abnormal   Collection Time: 12/05/19  4:17 AM  Result Value Ref Range   Uric Acid, Serum 1.2 (L) 3.7 - 8.6 mg/dL    Comment: Performed at [facility], [address], [place], [place] [address]  Comprehensive metabolic panel     Status: Abnormal  Collection Time: 12/05/19  4:17 AM  Result Value Ref Range   Sodium 140 135 - 145 mmol/L   Potassium 4.0 3.5 - 5.1 mmol/L   Chloride 113 (H) 98 - 111 mmol/L   CO2 21 (L) 22 - 32 mmol/L    Glucose, Bld 97 70 - 99 mg/dL    Comment: Glucose reference range applies only to samples taken after fasting for at least 8 hours.   BUN 52 (H) 8 - 23 mg/dL   Creatinine, Ser 4.99 (H) 0.61 - 1.24 mg/dL   Calcium 8.2 (L) 8.9 - 10.3 mg/dL   Total Protein 5.4 (L) 6.5 - 8.1 g/dL   Albumin 3.3 (L) 3.5 - 5.0 g/dL   AST 13 (L) 15 - 41 U/L   ALT 9 0 - 44 U/L   Alkaline Phosphatase 38 38 - 126 U/L   Total Bilirubin 0.6 0.3 - 1.2 mg/dL   GFR calc non Af Amer 10 (L) >60 mL/min   GFR calc Af Amer 12 (L) >60 mL/min   Anion gap 6 5 - 15    Comment: Performed at [facility], [address], [place], [place] [address]  CBC     Status: Abnormal   Collection Time: 12/05/19  4:17 AM  Result Value Ref Range   WBC 23.6 (H) 4.0 - 10.5 K/uL   RBC 2.74 (L) 4.22 - 5.81 MIL/uL   Hemoglobin 8.8 (L) 13.0 - 17.0 g/dL   HCT 28.2 (L) 39 - 52 %   MCV 102.9 (H) 80.0 - 100.0 fL   MCH 32.1 26.0 - 34.0 pg   MCHC 31.2 30.0 - 36.0 g/dL   RDW 15.7 (H) 11.5 - 15.5 %   Platelets 65 (L) 150 - 400 K/uL    Comment: SPECIMEN CHECKED FOR CLOTS CONSISTENT WITH PREVIOUS RESULT Immature Platelet Fraction may be clinically indicated, consider ordering this additional test [id number] REPEATED TO VERIFY    nRBC 0.1 0.0 - 0.2 %    Comment: Performed at [facility], [address], [place], [place] [address]   No results found.   MEDICATIONS: I have reviewed the patient's current medications.     Assessment/Plan:  1.  CLL: -He is tolerating venetoclax 10 mg daily very well. -Labs from this morning shows white count improved to 23.6, from 26.4 yesterday.  Platelet count is 65.  Hemoglobin dropped to 8.8. -He may be discharged home today.  He was instructed to drink about 3 L of fluid daily. -He already has a follow-up with me on Monday with labs.  2.  TLS prophylaxis: -Uric acid today is 1.2.  Phosphate is 4.1 and magnesium and potassium are normal. -Continue allopurinol 300 mg daily at home.  3.  Macrocytic  anemia: -Hemoglobin today is 8.8. -We will give Feraheme weekly x2 in the office.  4.  CKD: -Kidney biopsy on 12/19/2017 consistent with CLL involvement. -Creatinine improved to 4.99 today.  All questions were answered. The patient knows to call the clinic with any problems, questions or concerns. We can certainly see the patient much sooner if necessary.    Derek Jack

## 2019-12-05 NOTE — Care Management Important Message (Signed)
Important Message  Patient Details  Name: Andrew [name] MRN: [id number] Date of Birth: 01-11-1941   Medicare Important Message Given:  Yes     Tommy Medal 12/05/2019, 12:41 PM

## 2019-12-08 ENCOUNTER — Other Ambulatory Visit (HOSPITAL_COMMUNITY): Payer: Self-pay

## 2019-12-08 ENCOUNTER — Encounter (HOSPITAL_COMMUNITY): Payer: Self-pay | Admitting: Hematology

## 2019-12-08 ENCOUNTER — Inpatient Hospital Stay (HOSPITAL_COMMUNITY): Payer: Medicare HMO

## 2019-12-08 ENCOUNTER — Other Ambulatory Visit (HOSPITAL_COMMUNITY): Payer: Self-pay | Admitting: *Deleted

## 2019-12-08 ENCOUNTER — Other Ambulatory Visit: Payer: Self-pay

## 2019-12-08 ENCOUNTER — Inpatient Hospital Stay (HOSPITAL_COMMUNITY): Payer: Medicare HMO | Admitting: Hematology

## 2019-12-08 DIAGNOSIS — C9110 Chronic lymphocytic leukemia of B-cell type not having achieved remission: Secondary | ICD-10-CM | POA: Diagnosis not present

## 2019-12-08 DIAGNOSIS — N189 Chronic kidney disease, unspecified: Secondary | ICD-10-CM | POA: Diagnosis not present

## 2019-12-08 DIAGNOSIS — D539 Nutritional anemia, unspecified: Secondary | ICD-10-CM | POA: Diagnosis not present

## 2019-12-08 DIAGNOSIS — R748 Abnormal levels of other serum enzymes: Secondary | ICD-10-CM | POA: Diagnosis not present

## 2019-12-08 DIAGNOSIS — Z87891 Personal history of nicotine dependence: Secondary | ICD-10-CM | POA: Diagnosis not present

## 2019-12-08 LAB — CBC WITH DIFFERENTIAL/PLATELET
Abs Immature Granulocytes: 0.06 10*3/uL (ref 0.00–0.07)
Basophils Absolute: 0.1 10*3/uL (ref 0.0–0.1)
Basophils Relative: 0 %
Eosinophils Absolute: 0.2 10*3/uL (ref 0.0–0.5)
Eosinophils Relative: 1 %
HCT: 29.1 % — ABNORMAL LOW (ref 39.0–52.0)
Hemoglobin: 9.3 g/dL — ABNORMAL LOW (ref 13.0–17.0)
Immature Granulocytes: 0 %
Lymphocytes Relative: 82 %
Lymphs Abs: 20.2 10*3/uL — ABNORMAL HIGH (ref 0.7–4.0)
MCH: 32.7 pg (ref 26.0–34.0)
MCHC: 32 g/dL (ref 30.0–36.0)
MCV: 102.5 fL — ABNORMAL HIGH (ref 80.0–100.0)
Monocytes Absolute: 1.2 10*3/uL — ABNORMAL HIGH (ref 0.1–1.0)
Monocytes Relative: 5 %
Neutro Abs: 3 10*3/uL (ref 1.7–7.7)
Neutrophils Relative %: 12 %
Platelets: 74 10*3/uL — ABNORMAL LOW (ref 150–400)
RBC: 2.84 MIL/uL — ABNORMAL LOW (ref 4.22–5.81)
RDW: 15.8 % — ABNORMAL HIGH (ref 11.5–15.5)
WBC: 24.7 10*3/uL — ABNORMAL HIGH (ref 4.0–10.5)
nRBC: 0 % (ref 0.0–0.2)

## 2019-12-08 LAB — COMPREHENSIVE METABOLIC PANEL
ALT: 12 U/L (ref 0–44)
AST: 16 U/L (ref 15–41)
Albumin: 4 g/dL (ref 3.5–5.0)
Alkaline Phosphatase: 48 U/L (ref 38–126)
Anion gap: 9 (ref 5–15)
BUN: 55 mg/dL — ABNORMAL HIGH (ref 8–23)
CO2: 18 mmol/L — ABNORMAL LOW (ref 22–32)
Calcium: 8.7 mg/dL — ABNORMAL LOW (ref 8.9–10.3)
Chloride: 111 mmol/L (ref 98–111)
Creatinine, Ser: 5.47 mg/dL — ABNORMAL HIGH (ref 0.61–1.24)
GFR calc Af Amer: 11 mL/min — ABNORMAL LOW (ref >60)
GFR calc non Af Amer: 9 mL/min — ABNORMAL LOW (ref >60)
Glucose, Bld: 135 mg/dL — ABNORMAL HIGH (ref 70–99)
Potassium: 3.8 mmol/L (ref 3.5–5.1)
Sodium: 138 mmol/L (ref 135–145)
Total Bilirubin: 0.6 mg/dL (ref 0.3–1.2)
Total Protein: 6.2 g/dL — ABNORMAL LOW (ref 6.5–8.1)

## 2019-12-08 LAB — LACTATE DEHYDROGENASE: LDH: 132 U/L (ref 98–192)

## 2019-12-08 LAB — MAGNESIUM: Magnesium: 2.3 mg/dL (ref 1.7–2.4)

## 2019-12-08 NOTE — Progress Notes (Signed)
Andrew [name], Andrew [name]   CLINIC:  Medical Oncology/Hematology  PCP:  [name], MD [address] [place] [address]  [phone]  REASON FOR VISIT:  Follow-up for CLL  PRIOR THERAPY:  1. Imbruvica 420 mg from 08/19/2016 through 09/2016. 2. Chlorambucil 30 mg from 10/2017 through 11/07/2018.  CURRENT THERAPY: Venetoclax  INTERVAL HISTORY:  Andrew [name], a 79 y.o. male, returns for routine follow-up for his CLL. Andrew [name] was last seen on 11/13/2019.  Today he is accompanied by his wife and he reports drinking about 100 ounces of water daily. He is tolerating the venetoclax well.  Denies any GI side effects including nausea vomiting or diarrhea.   REVIEW OF SYSTEMS:  Review of Systems  Constitutional: Positive for fatigue (mild). Negative for appetite change.  Cardiovascular: Positive for leg swelling.  Gastrointestinal: Negative for abdominal pain, nausea and vomiting.  Neurological: Positive for numbness (+ tingling in toes).  All other systems reviewed and are negative.   PAST MEDICAL/SURGICAL HISTORY:  Past Medical History:  Diagnosis Date  . CKD (chronic kidney disease), stage IV ([place])   . CLL (chronic lymphocytic leukemia) ([place])   . Hypertension   . NICM (nonischemic cardiomyopathy) ([place])    a. cath 2007, EF 35%, minimal CAD. b. EF normal 2018.  Marland Kitchen PAF (paroxysmal atrial fibrillation) ([place])   . Pinched nerve in neck   . SDH (subdural hematoma) ([place])   . Thrombocytopenia ([place])    Past Surgical History:  Procedure Laterality Date  . BIOPSY  08/05/2018   Procedure: BIOPSY;  Surgeon: Danie Binder, MD;  [facility];  Service: Endoscopy;;  gastric   . CATARACT EXTRACTION, BILATERAL    . COLONOSCOPY  07/10/2011   Procedure: COLONOSCOPY;  Surgeon: Dorothyann Peng, MD;  [facility];  Service: Endoscopy;  Laterality: N/A;  10:30 AM  . CRANIOTOMY N/A 10/14/2016   Procedure: CRANIOTOMY  HEMATOMA EVACUATION SUBDURAL;  Surgeon: Jovita Gamma, MD;  Location: [facility];  Service: Neurosurgery;  Laterality: N/A;  CRANIOTOMY HEMATOMA EVACUATION SUBDURAL  . ESOPHAGOGASTRODUODENOSCOPY N/A 08/05/2018   Procedure: ESOPHAGOGASTRODUODENOSCOPY (EGD);  Surgeon: Danie Binder, MD;  [facility];  Service: Endoscopy;  Laterality: N/A;  3:00pm  . THYROIDECTOMY, PARTIAL    . TONSILLECTOMY      SOCIAL HISTORY:  Social History   Socioeconomic History  . Marital status: Married    Spouse name: Not on file  . Number of children: Not on file  . Years of education: Not on file  . Highest education level: Not on file  Occupational History  . Not on file  Tobacco Use  . Smoking status: Former Smoker    Packs/day: 1.00    Years: 15.00    Pack years: 15.00    Quit date: 01/26/1971    Years since quitting: 48.8  . Smokeless tobacco: Never Used  Vaping Use  . Vaping Use: Never used  Substance and Sexual Activity  . Alcohol use: No  . Drug use: No  . Sexual activity: Not on file  Other Topics Concern  . Not on file  Social History Narrative  . Not on file   Social Determinants of Health   Financial Resource Strain:   . Difficulty of Paying Living Expenses:   Food Insecurity:   . Worried About Charity fundraiser in the Last Year:   . [place] in the Last  Year:   Transportation Needs:   . Film/video editor (Medical):   Marland Kitchen Lack of Transportation (Non-Medical):   Physical Activity:   . Days of Exercise per Week:   . Minutes of Exercise per Session:   Stress:   . Feeling of Stress :   Social Connections:   . Frequency of Communication with Friends and Family:   . Frequency of Social Gatherings with Friends and Family:   . Attends Religious Services:   . Active Member of Clubs or Organizations:   . Attends Archivist Meetings:   Marland Kitchen Marital Status:   Intimate Partner Violence:   . Fear of Current or Ex-Partner:   . Emotionally Abused:   Marland Kitchen  Physically Abused:   . Sexually Abused:     FAMILY HISTORY:  Family History  Problem Relation Age of Onset  . CAD Father 105  . Cancer Mother   . Alzheimer's disease Mother   . Colon cancer Neg Hx     CURRENT MEDICATIONS:  Current Outpatient Medications  Medication Sig Dispense Refill  . allopurinol (ZYLOPRIM) 300 MG tablet Take 1 tablet (300 mg total) by mouth daily. 30 tablet 1  . carvedilol (COREG) 6.25 MG tablet Take 1 tablet (6.25 mg total) by mouth 2 (two) times daily. 180 tablet 3  . Cyanocobalamin (VITAMIN B12) 1000 MCG TBCR Take 1,000 mcg by mouth daily.     . Multiple Vitamin (MULITIVITAMIN WITH MINERALS) TABS Take 1 tablet by mouth daily.    Marland Kitchen omeprazole (PRILOSEC) 20 MG capsule 1 PO 30 MINS PRIOR TO BREAKFAST. 90 capsule 3  . prochlorperazine (COMPAZINE) 10 MG tablet Take 1 tablet (10 mg total) by mouth every 6 (six) hours as needed for nausea or vomiting. 45 tablet 2  . sodium bicarbonate 650 MG tablet Take 1 tablet (650 mg total) by mouth 2 (two) times daily. (Patient taking differently: Take 325 mg by mouth 2 (two) times daily. ) 60 tablet 0  . tamsulosin (FLOMAX) 0.4 MG CAPS capsule Take 0.4 mg by mouth daily.     Marland Kitchen venetoclax 10 MG TABS Take 10 mg by mouth daily.     No current facility-administered medications for this visit.    ALLERGIES:  No Known Allergies  PHYSICAL EXAM:  Performance status (ECOG): 1 - Symptomatic but completely ambulatory  Vitals:   12/08/19 1351  BP: (!) 144/89  Pulse: 74  Resp: 18  Temp: (!) 97.1 F (36.2 C)  SpO2: 96%   Wt Readings from Last 3 Encounters:  12/08/19 220 lb (99.8 kg)  12/03/19 220 lb 7.4 oz (100 kg)  12/02/19 217 lb 9.6 oz (98.7 kg)   Physical Exam Vitals reviewed.  Constitutional:      Appearance: Normal appearance.  Cardiovascular:     Rate and Rhythm: Normal rate. Rhythm irregular.     Pulses: Normal pulses.     Heart sounds: Normal heart sounds.  Pulmonary:     Effort: Pulmonary effort is normal.      Breath sounds: Normal breath sounds.  Musculoskeletal:     Right lower leg: Edema (1+) present.     Left lower leg: Edema (1+) present.  Neurological:     General: No focal deficit present.     Mental Status: He is alert and oriented to person, place, and time.  Psychiatric:        Mood and Affect: Mood normal.        Behavior: Behavior normal.     LABORATORY DATA:  I have reviewed the labs as listed.  CBC Latest Ref Rng & Units 12/05/2019 12/04/2019 12/03/2019  WBC 4.0 - 10.5 K/uL 23.6(H) 26.4(H) 31.3(H)  Hemoglobin 13.0 - 17.0 g/dL 8.8(L) 9.2(L) 9.2(L)  Hematocrit 39 - 52 % 28.2(L) 29.0(L) 29.3(L)  Platelets 150 - 400 K/uL 65(L) 68(L) 72(L)   CMP Latest Ref Rng & Units 12/05/2019 12/04/2019 12/03/2019  Glucose 70 - 99 mg/dL 97 98 114(H)  BUN 8 - 23 mg/dL 52(H) 51(H) 52(H)  Creatinine 0.61 - 1.24 mg/dL 4.99(H) 5.24(H) 5.36(H)  Sodium 135 - 145 mmol/L 140 140 138  Potassium 3.5 - 5.1 mmol/L 4.0 3.9 4.4  Chloride 98 - 111 mmol/L 113(H) 111 109  CO2 22 - 32 mmol/L 21(L) 19(L) 21(L)  Calcium 8.9 - 10.3 mg/dL 8.2(L) 8.4(L) 8.7(L)  Total Protein 6.5 - 8.1 g/dL 5.4(L) 5.5(L) -  Total Bilirubin 0.3 - 1.2 mg/dL 0.6 0.5 -  Alkaline Phos 38 - 126 U/L 38 40 -  AST 15 - 41 U/L 13(L) 13(L) -  ALT 0 - 44 U/L 9 11 -      Component Value Date/Time   RBC 2.74 (L) 12/05/2019 0417   MCV 102.9 (H) 12/05/2019 0417   MCH 32.1 12/05/2019 0417   MCHC 31.2 12/05/2019 0417   RDW 15.7 (H) 12/05/2019 0417   LYMPHSABS 30.5 (H) 12/02/2019 1359   MONOABS 1.5 (H) 12/02/2019 1359   EOSABS 0.3 12/02/2019 1359   BASOSABS 0.0 12/02/2019 1359    DIAGNOSTIC IMAGING:  I have independently reviewed the scans and discussed with the patient. NM PET Image Initial (PI) Skull Base To Thigh  Result Date: 12/02/2019 CLINICAL DATA:  Subsequent treatment strategy for chronic lymphocytic leukemia. EXAM: NUCLEAR MEDICINE PET SKULL BASE TO THIGH TECHNIQUE: 12.6 mCi F-18 FDG was injected intravenously. Full-ring PET imaging  was performed from the skull base to thigh after the radiotracer. CT data was obtained and used for attenuation correction and anatomic localization. Fasting blood glucose: 84 mg/dl COMPARISON:  PET-CT 11/04/2018 FINDINGS: Mediastinal blood pool activity: SUV max 1.8 Liver activity: SUV max 2.9 NECK: Again demonstrated multiple bilateral enlarged cervical lymph nodes. Nodes appears slightly larger than prior. For example LEFT level III lymph node (image 33) measures 12 mm compares to 7 mm on PET-CT 11/04/2018. Lesion has low metabolic activity SUV max equal 2.1. RIGHT level I lymph node on the RIGHT measures 12 mm compared to 8 mm (46/3) with SUV max equal 2.3. Incidental CT findings: none CHEST: Bilateral axillary and mediastinal adenopathy. Mediastinal lymph nodes increased in size moderately. For example prevascular node measuring 12 mm (image 88) compares to 10 mm. LEFT lower paratracheal node measures 12 mm compared with 7 mm. Lymph nodes have low metabolic activity similar to or less background blood pool. Axial lymph nodes are slightly enlarged with SUV max equal 1.9 compared to 1.3 on comparison scan Incidental CT findings: No suspicious nodularity. ABDOMEN/PELVIS: Minimal adenopathy in the retroperitoneum along the aorta. External iliac nodes are prominent. Example RIGHT external iliac lymph node measuring 14 mm compares to 13 mm. SUV max equal 2.0 Spleen is enlarged measures 16 cm craniocaudad dimension compared to 14.5 cm on prior no abnormal hypermetabolic activity. Incidental CT findings: Bilateral nonobstructing renal calculi. Prostate normal SKELETON: No abnormal activity in bone marrow. Incidental CT findings: none IMPRESSION: 1. Mild increase in size of lymph nodes in the neck, axilla, and mediastinum accompanied by mild increase in metabolic activity. Activity is ranges from less than to slightly above background blood (  [place] 2/3) 2. Adenopathy in the abdomen and pelvis is similar. 3. Mild  increase in volume of the spleen which has normal metabolic activity. Electronically Signed   By: Suzy Bouchard M.D.   On: 12/02/2019 08:16     ASSESSMENT:  1. CLL, Rai stage IV: -BMBX on 08/10/2016 shows bone marrow involvement by CLL. -He also had bulky diffuse adenopathy with progressive thrombocytopenia. -Imbruvica 420 mg from 08/19/2016 through May 2018, discontinued due to subdural hematoma on 10/14/2016. -Subsequently offered chlorambucil and obinutuzumab.  He refused obinutuzumab.  Chlorambucil 30 mg every 2 weeks from June 2019 through 11/07/2018 due to severe weakness. -PET scan on 12/01/2019 showed multiple bilateral enlarged cervical lymph nodes, bilateral axillary and mediastinal adenopathy, prevascular lymph nodes, left lower paratracheal lymph node, minimal adenopathy in the retroperitoneum, external iliac lymph nodes.  Spleen measures 16 cm.  All lymph nodes measure less than 5 cm with SUV less than 5. 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 -T p53 mutation is pending. -Venetoclax 10 mg started on 12/02/2019 (dose reduced secondary to Coreg)  2.  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   PLAN:  1. CLL, Rai stage IV: -He is tolerating venetoclax 10 mg daily very well.  He is drinking 100 ounces of water. -I have reviewed his labs.  White count is 24.7.  Hemoglobin 9.3 and platelet count is 74. -I have recommended increasing venetoclax to 20 mg daily starting 12/09/2019 as he already took the pill this morning. -I plan to repeat labs on Friday.  If there is improvement, will consider starting him on venetoclax 50 mg daily on Friday.  We will reevaluate him Monday.  2. CKD: -Creatinine today increased to 5.47.  He was told to continue drinking 100 ounces of fluid daily.  We will closely monitor.  3.  Tumor lysis prophylaxis: -Continue  allopurinol 300 mg daily.  Will check uric acid on Friday.  4.  Macrocytic anemia: -Hemolysis was ruled out.  Hemoglobin today improved slightly to 9.3.  Orders placed this encounter:  No orders of the defined types were placed in this encounter.    Derek Jack, MD [place] [phone]   I, Milinda Antis, am acting as a scribe for Dr. Sanda Linger.  I, Derek Jack MD, have reviewed the above documentation for accuracy and completeness, and I agree with the above.

## 2019-12-08 NOTE — Progress Notes (Signed)
Patient is taking ventetoclax and has not missed any doses and reports no side effects at this time.

## 2019-12-08 NOTE — Patient Instructions (Signed)
[place] at [facility] Discharge Instructions  You were seen today by Dr. Delton Coombes. He went over your recent results. Continue your usual care with your primary care provider. Dr. Delton Coombes will see you back in 1 week for labs and follow up.   Thank you for choosing [place] at [facility] to provide your oncology and hematology care.  To afford each patient quality time with our provider, please arrive at least 15 minutes before your scheduled appointment time.   If you have a lab appointment with the [facility] please come in thru the Main Entrance and check in at the main information desk  You need to re-schedule your appointment should you arrive 10 or more minutes late.  We strive to give you quality time with our providers, and arriving late affects you and other patients whose appointments are after yours.  Also, if you no show three or more times for appointments you may be dismissed from the clinic at the providers discretion.     Again, thank you for choosing [facility].  Our hope is that these requests will decrease the amount of time that you wait before being seen by our physicians.       _____________________________________________________________  Should you have questions after your visit to [facility], please contact our office at [phone] between the hours of 8:00 a.m. and 4:30 p.m.  Voicemails left after 4:00 p.m. will not be returned until the following business day.  For prescription refill requests, have your pharmacy contact our office and allow 72 hours.    Cancer Center Support Programs:   > Cancer Support Group  2nd Tuesday of the month 1pm-2pm, Journey Room

## 2019-12-09 LAB — MISC LABCORP TEST (SEND OUT): Labcorp test code: 160101

## 2019-12-11 ENCOUNTER — Encounter (HOSPITAL_COMMUNITY): Payer: Self-pay | Admitting: *Deleted

## 2019-12-11 ENCOUNTER — Encounter (HOSPITAL_COMMUNITY): Payer: Self-pay

## 2019-12-11 NOTE — Addendum Note (Signed)
Addended by: Farley Ly on: 12/11/2019 04:30 PM   Modules accepted: Orders

## 2019-12-11 NOTE — Progress Notes (Unsigned)
Dx: CLL: p53 mutation analysis by sequencing per Dr. Katragadda °

## 2019-12-12 ENCOUNTER — Inpatient Hospital Stay (HOSPITAL_COMMUNITY): Payer: Medicare HMO

## 2019-12-12 ENCOUNTER — Other Ambulatory Visit: Payer: Self-pay

## 2019-12-12 ENCOUNTER — Encounter (HOSPITAL_COMMUNITY): Payer: Self-pay

## 2019-12-12 DIAGNOSIS — C9110 Chronic lymphocytic leukemia of B-cell type not having achieved remission: Secondary | ICD-10-CM

## 2019-12-12 DIAGNOSIS — Z87891 Personal history of nicotine dependence: Secondary | ICD-10-CM | POA: Diagnosis not present

## 2019-12-12 DIAGNOSIS — R748 Abnormal levels of other serum enzymes: Secondary | ICD-10-CM | POA: Diagnosis not present

## 2019-12-12 DIAGNOSIS — D539 Nutritional anemia, unspecified: Secondary | ICD-10-CM | POA: Diagnosis not present

## 2019-12-12 DIAGNOSIS — N189 Chronic kidney disease, unspecified: Secondary | ICD-10-CM | POA: Diagnosis not present

## 2019-12-12 LAB — CBC WITH DIFFERENTIAL/PLATELET
Abs Immature Granulocytes: 0.03 10*3/uL (ref 0.00–0.07)
Basophils Absolute: 0 10*3/uL (ref 0.0–0.1)
Basophils Relative: 0 %
Eosinophils Absolute: 0.3 10*3/uL (ref 0.0–0.5)
Eosinophils Relative: 2 %
HCT: 30.7 % — ABNORMAL LOW (ref 39.0–52.0)
Hemoglobin: 9.7 g/dL — ABNORMAL LOW (ref 13.0–17.0)
Immature Granulocytes: 0 %
Lymphocytes Relative: 76 %
Lymphs Abs: 12.6 10*3/uL — ABNORMAL HIGH (ref 0.7–4.0)
MCH: 32.9 pg (ref 26.0–34.0)
MCHC: 31.6 g/dL (ref 30.0–36.0)
MCV: 104.1 fL — ABNORMAL HIGH (ref 80.0–100.0)
Monocytes Absolute: 1 10*3/uL (ref 0.1–1.0)
Monocytes Relative: 6 %
Neutro Abs: 2.7 10*3/uL (ref 1.7–7.7)
Neutrophils Relative %: 16 %
Platelets: 73 10*3/uL — ABNORMAL LOW (ref 150–400)
RBC: 2.95 MIL/uL — ABNORMAL LOW (ref 4.22–5.81)
RDW: 16.1 % — ABNORMAL HIGH (ref 11.5–15.5)
WBC Morphology: INCREASED
WBC: 16.7 10*3/uL — ABNORMAL HIGH (ref 4.0–10.5)
nRBC: 0 % (ref 0.0–0.2)

## 2019-12-12 LAB — COMPREHENSIVE METABOLIC PANEL
ALT: 11 U/L (ref 0–44)
AST: 13 U/L — ABNORMAL LOW (ref 15–41)
Albumin: 3.9 g/dL (ref 3.5–5.0)
Alkaline Phosphatase: 46 U/L (ref 38–126)
Anion gap: 11 (ref 5–15)
BUN: 57 mg/dL — ABNORMAL HIGH (ref 8–23)
CO2: 19 mmol/L — ABNORMAL LOW (ref 22–32)
Calcium: 8.4 mg/dL — ABNORMAL LOW (ref 8.9–10.3)
Chloride: 110 mmol/L (ref 98–111)
Creatinine, Ser: 5.8 mg/dL — ABNORMAL HIGH (ref 0.61–1.24)
GFR calc Af Amer: 10 mL/min — ABNORMAL LOW (ref >60)
GFR calc non Af Amer: 9 mL/min — ABNORMAL LOW (ref >60)
Glucose, Bld: 126 mg/dL — ABNORMAL HIGH (ref 70–99)
Potassium: 3.6 mmol/L (ref 3.5–5.1)
Sodium: 140 mmol/L (ref 135–145)
Total Bilirubin: 0.7 mg/dL (ref 0.3–1.2)
Total Protein: 6.2 g/dL — ABNORMAL LOW (ref 6.5–8.1)

## 2019-12-12 LAB — URIC ACID: Uric Acid, Serum: 4.3 mg/dL (ref 3.7–8.6)

## 2019-12-12 LAB — LACTATE DEHYDROGENASE: LDH: 118 U/L (ref 98–192)

## 2019-12-12 LAB — MAGNESIUM: Magnesium: 2.2 mg/dL (ref 1.7–2.4)

## 2019-12-12 MED ORDER — SODIUM CHLORIDE 0.9% FLUSH
10.0000 mL | INTRAVENOUS | Status: DC | PRN
  Administered 2019-12-12: 10 mL via INTRAVENOUS

## 2019-12-12 MED ORDER — SODIUM CHLORIDE 0.9 % IV SOLN: Freq: Once | INTRAVENOUS | Status: AC

## 2019-12-12 MED ORDER — HEPARIN SOD (PORK) LOCK FLUSH 100 UNIT/ML IV SOLN
500.0000 [IU] | Freq: Once | INTRAVENOUS | Status: AC
  Administered 2019-12-12: 500 [IU] via INTRAVENOUS

## 2019-12-12 NOTE — Progress Notes (Signed)
Patient presents today for labs and possible IV fluids. Creatinine 5.80, Magnesium 2.2, uric acid 4.3, LDH 118 today. Labs reviewed by [facility] NP. Verbal order received. 1 Liter of Normal Saline over 2 hours.   IV fluids given today per MD orders. Tolerated infusion without adverse affects. Vital signs stable. No complaints at this time. Discharged from clinic ambulatory. F/U with [facility] as scheduled.

## 2019-12-15 ENCOUNTER — Inpatient Hospital Stay (HOSPITAL_COMMUNITY): Payer: Medicare HMO

## 2019-12-15 ENCOUNTER — Other Ambulatory Visit: Payer: Self-pay

## 2019-12-15 DIAGNOSIS — N189 Chronic kidney disease, unspecified: Secondary | ICD-10-CM | POA: Diagnosis not present

## 2019-12-15 DIAGNOSIS — Z87891 Personal history of nicotine dependence: Secondary | ICD-10-CM | POA: Diagnosis not present

## 2019-12-15 DIAGNOSIS — C9110 Chronic lymphocytic leukemia of B-cell type not having achieved remission: Secondary | ICD-10-CM

## 2019-12-15 DIAGNOSIS — D539 Nutritional anemia, unspecified: Secondary | ICD-10-CM | POA: Diagnosis not present

## 2019-12-15 DIAGNOSIS — R748 Abnormal levels of other serum enzymes: Secondary | ICD-10-CM | POA: Diagnosis not present

## 2019-12-15 LAB — COMPREHENSIVE METABOLIC PANEL
ALT: 11 U/L (ref 0–44)
AST: 12 U/L — ABNORMAL LOW (ref 15–41)
Albumin: 4 g/dL (ref 3.5–5.0)
Alkaline Phosphatase: 44 U/L (ref 38–126)
Anion gap: 9 (ref 5–15)
BUN: 62 mg/dL — ABNORMAL HIGH (ref 8–23)
CO2: 19 mmol/L — ABNORMAL LOW (ref 22–32)
Calcium: 8.4 mg/dL — ABNORMAL LOW (ref 8.9–10.3)
Chloride: 111 mmol/L (ref 98–111)
Creatinine, Ser: 5.81 mg/dL — ABNORMAL HIGH (ref 0.61–1.24)
GFR calc Af Amer: 10 mL/min — ABNORMAL LOW (ref >60)
GFR calc non Af Amer: 8 mL/min — ABNORMAL LOW (ref >60)
Glucose, Bld: 104 mg/dL — ABNORMAL HIGH (ref 70–99)
Potassium: 4.1 mmol/L (ref 3.5–5.1)
Sodium: 139 mmol/L (ref 135–145)
Total Bilirubin: 0.9 mg/dL (ref 0.3–1.2)
Total Protein: 6.3 g/dL — ABNORMAL LOW (ref 6.5–8.1)

## 2019-12-15 LAB — CBC WITH DIFFERENTIAL/PLATELET
Abs Immature Granulocytes: 0.02 10*3/uL (ref 0.00–0.07)
Basophils Absolute: 0 10*3/uL (ref 0.0–0.1)
Basophils Relative: 0 %
Eosinophils Absolute: 0.2 10*3/uL (ref 0.0–0.5)
Eosinophils Relative: 3 %
HCT: 31 % — ABNORMAL LOW (ref 39.0–52.0)
Hemoglobin: 10.1 g/dL — ABNORMAL LOW (ref 13.0–17.0)
Immature Granulocytes: 0 %
Lymphocytes Relative: 58 %
Lymphs Abs: 4.2 10*3/uL — ABNORMAL HIGH (ref 0.7–4.0)
MCH: 33.4 pg (ref 26.0–34.0)
MCHC: 32.6 g/dL (ref 30.0–36.0)
MCV: 102.6 fL — ABNORMAL HIGH (ref 80.0–100.0)
Monocytes Absolute: 0.5 10*3/uL (ref 0.1–1.0)
Monocytes Relative: 7 %
Neutro Abs: 2.3 10*3/uL (ref 1.7–7.7)
Neutrophils Relative %: 32 %
Platelets: 73 10*3/uL — ABNORMAL LOW (ref 150–400)
RBC: 3.02 MIL/uL — ABNORMAL LOW (ref 4.22–5.81)
RDW: 16.2 % — ABNORMAL HIGH (ref 11.5–15.5)
WBC: 7.2 10*3/uL (ref 4.0–10.5)
nRBC: 0 % (ref 0.0–0.2)

## 2019-12-15 LAB — MAGNESIUM: Magnesium: 2.2 mg/dL (ref 1.7–2.4)

## 2019-12-16 ENCOUNTER — Other Ambulatory Visit: Payer: Self-pay

## 2019-12-16 ENCOUNTER — Ambulatory Visit (HOSPITAL_COMMUNITY): Payer: Medicare HMO | Admitting: Hematology

## 2019-12-16 ENCOUNTER — Inpatient Hospital Stay (HOSPITAL_COMMUNITY): Payer: Medicare HMO | Admitting: Hematology

## 2019-12-16 VITALS — BP 147/89 | HR 76 | Temp 96.9°F | Resp 18 | Wt 221.7 lb

## 2019-12-16 DIAGNOSIS — C9110 Chronic lymphocytic leukemia of B-cell type not having achieved remission: Secondary | ICD-10-CM

## 2019-12-16 DIAGNOSIS — N189 Chronic kidney disease, unspecified: Secondary | ICD-10-CM | POA: Diagnosis not present

## 2019-12-16 DIAGNOSIS — D539 Nutritional anemia, unspecified: Secondary | ICD-10-CM | POA: Diagnosis not present

## 2019-12-16 DIAGNOSIS — R748 Abnormal levels of other serum enzymes: Secondary | ICD-10-CM | POA: Diagnosis not present

## 2019-12-16 DIAGNOSIS — Z87891 Personal history of nicotine dependence: Secondary | ICD-10-CM | POA: Diagnosis not present

## 2019-12-16 NOTE — Progress Notes (Signed)
[facility] [address], [place] [address]   CLINIC:  Medical Oncology/Hematology  PCP:  [name], MD [address] [place] [address]  [phone]  REASON FOR VISIT:  Follow-up for CLL  PRIOR THERAPY:  1. Imbruvica 420 mg from 08/19/2016 through 09/2016. 2. Chlorambucil 30 mg from 10/2017 through 11/07/2018.  CURRENT THERAPY: Venetoclax  INTERVAL HISTORY:  [name], a 79 y.o. male, returns for routine follow-up for his CLL. [name] was last seen on 12/08/2019.  Today he is accompanied by his wife. He has started taking venetoclax 50 mg and is tolerating it well. He continues drinking 110-120 ounces of water daily. He denies having any skin rashes or F/C/night sweats. His appetite is good.   REVIEW OF SYSTEMS:  Review of Systems  Constitutional: Negative for appetite change, chills, diaphoresis, fatigue and fever.  Skin: Negative for rash.  All other systems reviewed and are negative.   PAST MEDICAL/SURGICAL HISTORY:  Past Medical History:  Diagnosis Date   CKD (chronic kidney disease), stage IV (HCC)    CLL (chronic lymphocytic leukemia) ([place])    Hypertension    NICM (nonischemic cardiomyopathy) ([place])    a. cath 2007, EF 35%, minimal CAD. b. EF normal 2018.   PAF (paroxysmal atrial fibrillation) (HCC)    Pinched nerve in neck    SDH (subdural hematoma) (HCC)    Thrombocytopenia ([place])    Past Surgical History:  Procedure Laterality Date   BIOPSY  08/05/2018   Procedure: BIOPSY;  Surgeon: Danie Binder, MD;  [facility];  Service: Endoscopy;;  gastric    CATARACT EXTRACTION, BILATERAL     COLONOSCOPY  07/10/2011   Procedure: COLONOSCOPY;  Surgeon: Dorothyann Peng, MD;  [facility];  Service: Endoscopy;  Laterality: N/A;  10:30 AM   CRANIOTOMY N/A 10/14/2016   Procedure: CRANIOTOMY HEMATOMA EVACUATION SUBDURAL;  Surgeon: Jovita Gamma, MD;  Location: [facility];  Service: Neurosurgery;   Laterality: N/A;  CRANIOTOMY HEMATOMA EVACUATION SUBDURAL   ESOPHAGOGASTRODUODENOSCOPY N/A 08/05/2018   Procedure: ESOPHAGOGASTRODUODENOSCOPY (EGD);  Surgeon: Danie Binder, MD;  [facility];  Service: Endoscopy;  Laterality: N/A;  3:00pm   THYROIDECTOMY, PARTIAL     TONSILLECTOMY      SOCIAL HISTORY:  Social History   Socioeconomic History   Marital status: Married    Spouse name: Not on file   Number of children: Not on file   Years of education: Not on file   Highest education level: Not on file  Occupational History   Not on file  Tobacco Use   Smoking status: Former Smoker    Packs/day: 1.00    Years: 15.00    Pack years: 15.00    Quit date: 01/26/1971    Years since quitting: 48.9   Smokeless tobacco: Never Used  Vaping Use   Vaping Use: Never used  Substance and Sexual Activity   Alcohol use: No   Drug use: No   Sexual activity: Not on file  Other Topics Concern   Not on file  Social History Narrative   Not on file   Social Determinants of Health   Financial Resource Strain:    Difficulty of Paying Living Expenses:   Food Insecurity:    Worried About Charity fundraiser in the Last Year:    Arboriculturist in the Last Year:   Transportation Needs:    Film/video editor (Medical):  Lack of Transportation (Non-Medical):   Physical Activity:    Days of Exercise per Week:    Minutes of Exercise per Session:   Stress:    Feeling of Stress :   Social Connections:    Frequency of Communication with Friends and Family:    Frequency of Social Gatherings with Friends and Family:    Attends Religious Services:    Active Member of Clubs or Organizations:    Attends Music therapist:    Marital Status:   Intimate Partner Violence:    Fear of Current or Ex-Partner:    Emotionally Abused:    Physically Abused:    Sexually Abused:     FAMILY HISTORY:  Family History  Problem Relation Age of  Onset   CAD Father 42   Cancer Mother    Alzheimer's disease Mother    Colon cancer Neg Hx     CURRENT MEDICATIONS:  Current Outpatient Medications  Medication Sig Dispense Refill   allopurinol (ZYLOPRIM) 300 MG tablet Take 1 tablet (300 mg total) by mouth daily. 30 tablet 1   carvedilol (COREG) 6.25 MG tablet Take 1 tablet (6.25 mg total) by mouth 2 (two) times daily. 180 tablet 3   Cyanocobalamin (VITAMIN B12) 1000 MCG TBCR Take 1,000 mcg by mouth daily.      Multiple Vitamin (MULITIVITAMIN WITH MINERALS) TABS Take 1 tablet by mouth daily.     omeprazole (PRILOSEC) 20 MG capsule 1 PO 30 MINS PRIOR TO BREAKFAST. 90 capsule 3   sodium bicarbonate 650 MG tablet Take 1 tablet (650 mg total) by mouth 2 (two) times daily. (Patient taking differently: Take 325 mg by mouth 2 (two) times daily. ) 60 tablet 0   tamsulosin (FLOMAX) 0.4 MG CAPS capsule Take 0.4 mg by mouth daily.      venetoclax 10 MG TABS Take 10 mg by mouth daily.     prochlorperazine (COMPAZINE) 10 MG tablet Take 1 tablet (10 mg total) by mouth every 6 (six) hours as needed for nausea or vomiting. (Patient not taking: Reported on 12/16/2019) 45 tablet 2   No current facility-administered medications for this visit.    ALLERGIES:  No Known Allergies  PHYSICAL EXAM:  Performance status (ECOG): 1 - Symptomatic but completely ambulatory  Vitals:   12/16/19 1304  BP: (!) 147/89  Pulse: 76  Resp: 18  Temp: (!) 96.9 F (36.1 C)  SpO2: 99%   Wt Readings from Last 3 Encounters:  12/16/19 221 lb 11.2 oz (100.6 kg)  12/12/19 217 lb 12.8 oz (98.8 kg)  12/08/19 220 lb (99.8 kg)   Physical Exam Vitals reviewed.  Constitutional:      Appearance: Normal appearance.  Cardiovascular:     Rate and Rhythm: Normal rate. Rhythm irregular.     Pulses: Normal pulses.     Heart sounds: Normal heart sounds.  Pulmonary:     Effort: Pulmonary effort is normal.     Breath sounds: Normal breath sounds.  Abdominal:      Palpations: Abdomen is soft. There is no hepatomegaly, splenomegaly or mass.     Tenderness: There is no abdominal tenderness.     Hernia: No hernia is present.  Musculoskeletal:     Right lower leg: Edema (trace) present.     Left lower leg: Edema (trace) present.  Lymphadenopathy:     Lower Body: No right inguinal adenopathy. No left inguinal adenopathy.  Neurological:     General: No focal deficit present.  Mental Status: He is alert and oriented to person, place, and time.  Psychiatric:        Mood and Affect: Mood normal.        Behavior: Behavior normal.     LABORATORY DATA:  I have reviewed the labs as listed.  CBC Latest Ref Rng & Units 12/15/2019 12/12/2019 12/08/2019  WBC 4.0 - 10.5 K/uL 7.2 16.7(H) 24.7(H)  Hemoglobin 13.0 - 17.0 g/dL 10.1(L) 9.7(L) 9.3(L)  Hematocrit 39 - 52 % 31.0(L) 30.7(L) 29.1(L)  Platelets 150 - 400 K/uL 73(L) 73(L) 74(L)   CMP Latest Ref Rng & Units 12/15/2019 12/12/2019 12/08/2019  Glucose 70 - 99 mg/dL 104(H) 126(H) 135(H)  BUN 8 - 23 mg/dL 62(H) 57(H) 55(H)  Creatinine 0.61 - 1.24 mg/dL 5.81(H) 5.80(H) 5.47(H)  Sodium 135 - 145 mmol/L 139 140 138  Potassium 3.5 - 5.1 mmol/L 4.1 3.6 3.8  Chloride 98 - 111 mmol/L 111 110 111  CO2 22 - 32 mmol/L 19(L) 19(L) 18(L)  Calcium 8.9 - 10.3 mg/dL 8.4(L) 8.4(L) 8.7(L)  Total Protein 6.5 - 8.1 g/dL 6.3(L) 6.2(L) 6.2(L)  Total Bilirubin 0.3 - 1.2 mg/dL 0.9 0.7 0.6  Alkaline Phos 38 - 126 U/L 44 46 48  AST 15 - 41 U/L 12(L) 13(L) 16  ALT 0 - 44 U/L _0 Component Value Date/Time   RBC 3.02 (L) 12/15/2019 0951   MCV 102.6 (H) 12/15/2019 0951   MCH 33.4 12/15/2019 0951   MCHC 32.6 12/15/2019 0951   RDW 16.2 (H) 12/15/2019 0951   LYMPHSABS 4.2 (H) 12/15/2019 0951   MONOABS 0.5 12/15/2019 0951   EOSABS 0.2 12/15/2019 0951   BASOSABS 0.0 12/15/2019 0951   Lab Results  Component Value Date   LDH 118 12/12/2019   LDH 132 12/08/2019   LDH 132 12/02/2019     DIAGNOSTIC IMAGING:  I have  independently reviewed the scans and discussed with the patient. NM PET Image Initial (PI) Skull Base To Thigh  Result Date: 12/02/2019 CLINICAL DATA:  Subsequent treatment strategy for chronic lymphocytic leukemia. EXAM: NUCLEAR MEDICINE PET SKULL BASE TO THIGH TECHNIQUE: 12.6 mCi F-18 FDG was injected intravenously. Full-ring PET imaging was performed from the skull base to thigh after the radiotracer. CT data was obtained and used for attenuation correction and anatomic localization. Fasting blood glucose: 84 mg/dl COMPARISON:  PET-CT 11/04/2018 FINDINGS: Mediastinal blood pool activity: SUV max 1.8 Liver activity: SUV max 2.9 NECK: Again demonstrated multiple bilateral enlarged cervical lymph nodes. Nodes appears slightly larger than prior. For example LEFT level III lymph node (image 33) measures 12 mm compares to 7 mm on PET-CT 11/04/2018. Lesion has low metabolic activity SUV max equal 2.1. RIGHT level I lymph node on the RIGHT measures 12 mm compared to 8 mm (46/3) with SUV max equal 2.3. Incidental CT findings: none CHEST: Bilateral axillary and mediastinal adenopathy. Mediastinal lymph nodes increased in size moderately. For example prevascular node measuring 12 mm (image 88) compares to 10 mm. LEFT lower paratracheal node measures 12 mm compared with 7 mm. Lymph nodes have low metabolic activity similar to or less background blood pool. Axial lymph nodes are slightly enlarged with SUV max equal 1.9 compared to 1.3 on comparison scan Incidental CT findings: No suspicious nodularity. ABDOMEN/PELVIS: Minimal adenopathy in the retroperitoneum along the aorta. External iliac nodes are prominent. Example RIGHT external iliac lymph node measuring 14 mm compares to 13 mm. SUV max equal 2.0 Spleen is enlarged measures 16 cm  craniocaudad dimension compared to 14.5 cm on prior no abnormal hypermetabolic activity. Incidental CT findings: Bilateral nonobstructing renal calculi. Prostate normal SKELETON: No abnormal  activity in bone marrow. Incidental CT findings: none IMPRESSION: 1. Mild increase in size of lymph nodes in the neck, axilla, and mediastinum accompanied by mild increase in metabolic activity. Activity is ranges from less than to slightly above background blood ( [place] 2/3) 2. Adenopathy in the abdomen and pelvis is similar. 3. Mild increase in volume of the spleen which has normal metabolic activity. Electronically Signed   By: Suzy Bouchard M.D.   On: 12/02/2019 08:16     ASSESSMENT:  1. CLL, Rai stage IV: -BMBX on 08/10/2016 shows bone marrow involvement by CLL. -He also had bulky diffuse adenopathy with progressive thrombocytopenia. -Imbruvica 420 mg from 08/19/2016 through May 2018, discontinued due to subdural hematoma on 10/14/2016. -Subsequently offered chlorambucil and obinutuzumab. He refused obinutuzumab. Chlorambucil 30 mg every 2 weeks from June 2019 through 11/07/2018 due to severe weakness. -PET scan on 12/01/2019 showed multiple bilateral enlarged cervical lymph nodes, bilateral axillary and mediastinal adenopathy, prevascular lymph nodes, left lower paratracheal lymph node, minimal adenopathy in the retroperitoneum, external iliac lymph nodes.  Spleen measures 16 cm.  All lymph nodes measure less than 5 cm with SUV less than 5. 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 -T p53 mutation is pending. -Venetoclax 10 mg started on 12/02/2019 (dose reduced secondary to Coreg), dose increased to 50 mg daily on 12/12/2019.  2. 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   PLAN:  1. CLL, Rai stage IV: -Venetoclax dose increased to 50 mg daily on 12/12/2019.  He is tolerating it well. -I reviewed his labs.  White count is 7.2.  Hemoglobin 10.1 and platelet count 73. -He will increase the venetoclax to 100 mg daily on 12/19/2019.  I will likely cap at 200 mg  daily dose as he is on carvedilol. -I plan to reevaluate him next week with labs.  We will check his uric acid level. -We will consider rituximab during week 5.  2. CKD: -Creatinine gradually increased to 5.81.  He will continue at least 100 ounces of water daily.  3. Tumor lysis prophylaxis: -We will check uric acid level next week.  We will see if we need to continue allopurinol 300 mg daily beyond 30 days.  4. Macrocytic anemia: -Hemoglobin improved to 10.1.  Orders placed this encounter:  No orders of the defined types were placed in this encounter.    Derek Jack, MD [place] [phone]   I, Milinda Antis, am acting as a scribe for Dr. Sanda Linger.  I, Derek Jack MD, have reviewed the above documentation for accuracy and completeness, and I agree with the above.

## 2019-12-16 NOTE — Patient Instructions (Addendum)
[place] at [facility] Discharge Instructions  You were seen today by Dr. Delton Coombes. He went over your recent results. Start taking 100 mg of venetoclax on Friday, 12/19/2019. Dr. Delton Coombes will see you back in 1 week for labs and follow up.   Thank you for choosing [place] at [facility] to provide your oncology and hematology care.  To afford each patient quality time with our provider, please arrive at least 15 minutes before your scheduled appointment time.   If you have a lab appointment with the [facility] please come in thru the Main Entrance and check in at the main information desk  You need to re-schedule your appointment should you arrive 10 or more minutes late.  We strive to give you quality time with our providers, and arriving late affects you and other patients whose appointments are after yours.  Also, if you no show three or more times for appointments you may be dismissed from the clinic at the providers discretion.     Again, thank you for choosing [facility].  Our hope is that these requests will decrease the amount of time that you wait before being seen by our physicians.       _____________________________________________________________  Should you have questions after your visit to [facility], please contact our office at [phone] between the hours of 8:00 a.m. and 4:30 p.m.  Voicemails left after 4:00 p.m. will not be returned until the following business day.  For prescription refill requests, have your pharmacy contact our office and allow 72 hours.    Cancer Center Support Programs:   > Cancer Support Group  2nd Tuesday of the month 1pm-2pm, Journey Room

## 2019-12-19 ENCOUNTER — Other Ambulatory Visit (HOSPITAL_COMMUNITY): Payer: Self-pay | Admitting: Hematology

## 2019-12-19 DIAGNOSIS — C9110 Chronic lymphocytic leukemia of B-cell type not having achieved remission: Secondary | ICD-10-CM

## 2019-12-22 ENCOUNTER — Other Ambulatory Visit (HOSPITAL_COMMUNITY): Payer: Self-pay | Admitting: Hematology

## 2019-12-22 DIAGNOSIS — C9110 Chronic lymphocytic leukemia of B-cell type not having achieved remission: Secondary | ICD-10-CM

## 2019-12-23 DIAGNOSIS — D696 Thrombocytopenia, unspecified: Secondary | ICD-10-CM | POA: Diagnosis not present

## 2019-12-23 DIAGNOSIS — Z0189 Encounter for other specified special examinations: Secondary | ICD-10-CM | POA: Diagnosis not present

## 2019-12-23 DIAGNOSIS — N184 Chronic kidney disease, stage 4 (severe): Secondary | ICD-10-CM | POA: Diagnosis not present

## 2019-12-23 DIAGNOSIS — I4891 Unspecified atrial fibrillation: Secondary | ICD-10-CM | POA: Diagnosis not present

## 2019-12-23 DIAGNOSIS — I10 Essential (primary) hypertension: Secondary | ICD-10-CM | POA: Diagnosis not present

## 2019-12-23 DIAGNOSIS — D649 Anemia, unspecified: Secondary | ICD-10-CM | POA: Diagnosis not present

## 2019-12-23 DIAGNOSIS — C9110 Chronic lymphocytic leukemia of B-cell type not having achieved remission: Secondary | ICD-10-CM | POA: Diagnosis not present

## 2019-12-23 DIAGNOSIS — E782 Mixed hyperlipidemia: Secondary | ICD-10-CM | POA: Diagnosis not present

## 2019-12-24 LAB — MISC LABCORP TEST (SEND OUT): Labcorp test code: 489590

## 2019-12-25 ENCOUNTER — Inpatient Hospital Stay (HOSPITAL_COMMUNITY): Payer: Medicare HMO

## 2019-12-25 ENCOUNTER — Other Ambulatory Visit: Payer: Self-pay

## 2019-12-25 DIAGNOSIS — C9110 Chronic lymphocytic leukemia of B-cell type not having achieved remission: Secondary | ICD-10-CM | POA: Diagnosis not present

## 2019-12-25 DIAGNOSIS — R748 Abnormal levels of other serum enzymes: Secondary | ICD-10-CM | POA: Diagnosis not present

## 2019-12-25 DIAGNOSIS — N189 Chronic kidney disease, unspecified: Secondary | ICD-10-CM | POA: Diagnosis not present

## 2019-12-25 DIAGNOSIS — Z87891 Personal history of nicotine dependence: Secondary | ICD-10-CM | POA: Diagnosis not present

## 2019-12-25 DIAGNOSIS — D539 Nutritional anemia, unspecified: Secondary | ICD-10-CM | POA: Diagnosis not present

## 2019-12-25 LAB — CBC WITH DIFFERENTIAL/PLATELET
Abs Immature Granulocytes: 0 10*3/uL (ref 0.00–0.07)
Basophils Absolute: 0 10*3/uL (ref 0.0–0.1)
Basophils Relative: 0 %
Eosinophils Absolute: 0 10*3/uL (ref 0.0–0.5)
Eosinophils Relative: 2 %
HCT: 29.4 % — ABNORMAL LOW (ref 39.0–52.0)
Hemoglobin: 9.4 g/dL — ABNORMAL LOW (ref 13.0–17.0)
Immature Granulocytes: 0 %
Lymphocytes Relative: 23 %
Lymphs Abs: 0.6 10*3/uL — ABNORMAL LOW (ref 0.7–4.0)
MCH: 33.1 pg (ref 26.0–34.0)
MCHC: 32 g/dL (ref 30.0–36.0)
MCV: 103.5 fL — ABNORMAL HIGH (ref 80.0–100.0)
Monocytes Absolute: 0.3 10*3/uL (ref 0.1–1.0)
Monocytes Relative: 12 %
Neutro Abs: 1.5 10*3/uL — ABNORMAL LOW (ref 1.7–7.7)
Neutrophils Relative %: 63 %
Platelets: 65 10*3/uL — ABNORMAL LOW (ref 150–400)
RBC: 2.84 MIL/uL — ABNORMAL LOW (ref 4.22–5.81)
RDW: 15.1 % (ref 11.5–15.5)
WBC: 2.4 10*3/uL — ABNORMAL LOW (ref 4.0–10.5)
nRBC: 0 % (ref 0.0–0.2)

## 2019-12-25 LAB — COMPREHENSIVE METABOLIC PANEL
ALT: 16 U/L (ref 0–44)
AST: 16 U/L (ref 15–41)
Albumin: 4.1 g/dL (ref 3.5–5.0)
Alkaline Phosphatase: 42 U/L (ref 38–126)
Anion gap: 10 (ref 5–15)
BUN: 83 mg/dL — ABNORMAL HIGH (ref 8–23)
CO2: 17 mmol/L — ABNORMAL LOW (ref 22–32)
Calcium: 8.7 mg/dL — ABNORMAL LOW (ref 8.9–10.3)
Chloride: 112 mmol/L — ABNORMAL HIGH (ref 98–111)
Creatinine, Ser: 7.09 mg/dL — ABNORMAL HIGH (ref 0.61–1.24)
GFR calc Af Amer: 8 mL/min — ABNORMAL LOW (ref >60)
GFR calc non Af Amer: 7 mL/min — ABNORMAL LOW (ref >60)
Glucose, Bld: 97 mg/dL (ref 70–99)
Potassium: 4.3 mmol/L (ref 3.5–5.1)
Sodium: 139 mmol/L (ref 135–145)
Total Bilirubin: 0.7 mg/dL (ref 0.3–1.2)
Total Protein: 6.2 g/dL — ABNORMAL LOW (ref 6.5–8.1)

## 2019-12-25 LAB — LACTATE DEHYDROGENASE: LDH: 103 U/L (ref 98–192)

## 2019-12-25 LAB — MAGNESIUM: Magnesium: 2.6 mg/dL — ABNORMAL HIGH (ref 1.7–2.4)

## 2019-12-25 LAB — URIC ACID: Uric Acid, Serum: 6.2 mg/dL (ref 3.7–8.6)

## 2019-12-25 LAB — PHOSPHORUS: Phosphorus: 7.1 mg/dL — ABNORMAL HIGH (ref 2.5–4.6)

## 2019-12-26 ENCOUNTER — Inpatient Hospital Stay (HOSPITAL_COMMUNITY): Payer: Medicare HMO

## 2019-12-26 ENCOUNTER — Inpatient Hospital Stay (HOSPITAL_COMMUNITY): Payer: Medicare HMO | Admitting: Hematology

## 2019-12-26 ENCOUNTER — Other Ambulatory Visit: Payer: Self-pay

## 2019-12-26 VITALS — BP 119/65 | HR 82 | Temp 97.7°F | Resp 18 | Wt 216.3 lb

## 2019-12-26 DIAGNOSIS — C9110 Chronic lymphocytic leukemia of B-cell type not having achieved remission: Secondary | ICD-10-CM

## 2019-12-26 DIAGNOSIS — D539 Nutritional anemia, unspecified: Secondary | ICD-10-CM | POA: Diagnosis not present

## 2019-12-26 DIAGNOSIS — N189 Chronic kidney disease, unspecified: Secondary | ICD-10-CM | POA: Diagnosis not present

## 2019-12-26 DIAGNOSIS — R748 Abnormal levels of other serum enzymes: Secondary | ICD-10-CM | POA: Diagnosis not present

## 2019-12-26 DIAGNOSIS — Z87891 Personal history of nicotine dependence: Secondary | ICD-10-CM | POA: Diagnosis not present

## 2019-12-26 MED ORDER — SODIUM CHLORIDE 0.9 % IV BOLUS
1000.0000 mL | Freq: Once | INTRAVENOUS | Status: AC
  Administered 2019-12-26: 1000 mL via INTRAVENOUS

## 2019-12-26 NOTE — Patient Instructions (Addendum)
[place] at [facility] Discharge Instructions  You were seen and examined today by Dr. Delton Coombes. You recently increased you Venetoclax to 100mg  on July 23rd. You will not double this medication again at this time. Remain on 100mg  of Venetoclax.  Your kidney function levels have continued to increase. Ensure that you are drinking at least 100oz of water daily. We will give you fluids today. You should also follow-up with your Nephrologist.  You will have labs done on Monday. Do not take any Venetoclax until after the lab work.  Thank you for choosing [place] at [facility] to provide your oncology and hematology care.  To afford each patient quality time with our provider, please arrive at least 15 minutes before your scheduled appointment time.   If you have a lab appointment with the [facility] please come in thru the Main Entrance and check in at the main information desk.  You need to re-schedule your appointment should you arrive 10 or more minutes late.  We strive to give you quality time with our providers, and arriving late affects you and other patients whose appointments are after yours.  Also, if you no show three or more times for appointments you may be dismissed from the clinic at the providers discretion.     Again, thank you for choosing [facility].  Our hope is that these requests will decrease the amount of time that you wait before being seen by our physicians.       _____________________________________________________________  Should you have questions after your visit to [facility], please contact our office at [phone] and follow the prompts.  Our office hours are 8:00 a.m. and 4:30 p.m. Monday - Friday.  Please note that voicemails left after 4:00 p.m. may not be returned until the following business day.  We are closed weekends and major holidays.  You do have access to a nurse 24-7,  just call the main number to the clinic [phone] and do not press any options, hold on the line and a nurse will answer the phone.    For prescription refill requests, have your pharmacy contact our office and allow 72 hours.    Due to Covid, you will need to wear a mask upon entering the hospital. If you do not have a mask, a mask will be given to you at the Main Entrance upon arrival. For doctor visits, patients may have 1 support person age 3 or older with them. For treatment visits, patients can not have anyone with them due to social distancing guidelines and our immunocompromised population.

## 2019-12-26 NOTE — Progress Notes (Signed)
Patient seen by Dr.Katragadda today. Creatinine 7.09, BUN 83. Patient added on schedule for 1 Liter of NS over 2 hours per Dr. Delton Coombes.   1 Liter of Normal Saline given today per MD orders. Tolerated infusion without adverse affects. Vital signs stable. No complaints at this time. Discharged from clinic ambulatory. F/U with [facility] as scheduled.

## 2019-12-26 NOTE — Patient Instructions (Signed)
[facility] at [facility]  Discharge Instructions:   _______________________________________________________________  Thank you for choosing [facility] at [facility] to provide your oncology and hematology care.  To afford each patient quality time with our providers, please arrive at least 15 minutes before your scheduled appointment.  You need to re-schedule your appointment if you arrive 10 or more minutes late.  We strive to give you quality time with our providers, and arriving late affects you and other patients whose appointments are after yours.  Also, if you no show three or more times for appointments you may be dismissed from the clinic.  Again, thank you for choosing [facility]. Our hope is that these requests will allow you access to exceptional care and in a timely manner. _______________________________________________________________  If you have questions after your visit, please contact our office at [phone] between the hours of 8:30 a.m. and 5:00 p.m. Voicemails left after 4:30 p.m. will not be returned until the following business day. _______________________________________________________________  For prescription refill requests, have your pharmacy contact our office. _______________________________________________________________  Recommendations made by the consultant and any test results will be sent to your referring physician. _______________________________________________________________ 

## 2019-12-26 NOTE — Progress Notes (Signed)
[facility] [address] [place] [address]   CLINIC:  Medical Oncology/Hematology  PCP:  [name], MD [address] [place] [address]  [phone]  REASON FOR VISIT:  Follow-up for CLL  PRIOR THERAPY:  1. Imbruvica 420 mg from 08/19/2016 through 09/2016. 2. Chlorambucil 30 mg from 10/2017 through 11/07/2018.  CURRENT THERAPY: Venetoclax  INTERVAL HISTORY:  [name], a 79 y.o. male, returns for routine follow-up for his CLL. Andrew [name] was last seen on 12/16/2019.  Today he reports that he is taking 100 mg venetoclax daily for 1 week and tolerating it well. He continues drinking more than 100 ounces of water daily; he urinates every 1 to 1.5 hours and his urine color is clear. He denies having N/V/D and his numbness in his toes is stable.  He will see his nephrologist on 01/19/2020.   REVIEW OF SYSTEMS:  Review of Systems  Constitutional: Positive for fatigue (mild). Negative for appetite change.  Gastrointestinal: Negative for diarrhea, nausea and vomiting.  Neurological: Positive for numbness (toes).  All other systems reviewed and are negative.   PAST MEDICAL/SURGICAL HISTORY:  Past Medical History:  Diagnosis Date  . CKD (chronic kidney disease), stage IV ([place])   . CLL (chronic lymphocytic leukemia) ([place])   . Hypertension   . NICM (nonischemic cardiomyopathy) ([place])    a. cath 2007, EF 35%, minimal CAD. b. EF normal 2018.  Marland Kitchen PAF (paroxysmal atrial fibrillation) ([place])   . Pinched nerve in neck   . SDH (subdural hematoma) ([place])   . Thrombocytopenia ([place])    Past Surgical History:  Procedure Laterality Date  . BIOPSY  08/05/2018   Procedure: BIOPSY;  Surgeon: Danie Binder, MD;  [facility];  Service: Endoscopy;;  gastric   . CATARACT EXTRACTION, BILATERAL    . COLONOSCOPY  07/10/2011   Procedure: COLONOSCOPY;  Surgeon: Dorothyann Peng, MD;  [facility];  Service: Endoscopy;  Laterality: N/A;   10:30 AM  . CRANIOTOMY N/A 10/14/2016   Procedure: CRANIOTOMY HEMATOMA EVACUATION SUBDURAL;  Surgeon: Jovita Gamma, MD;  Location: [facility];  Service: Neurosurgery;  Laterality: N/A;  CRANIOTOMY HEMATOMA EVACUATION SUBDURAL  . ESOPHAGOGASTRODUODENOSCOPY N/A 08/05/2018   Procedure: ESOPHAGOGASTRODUODENOSCOPY (EGD);  Surgeon: Danie Binder, MD;  [facility];  Service: Endoscopy;  Laterality: N/A;  3:00pm  . THYROIDECTOMY, PARTIAL    . TONSILLECTOMY      SOCIAL HISTORY:  Social History   Socioeconomic History  . Marital status: Married    Spouse name: Not on file  . Number of children: Not on file  . Years of education: Not on file  . Highest education level: Not on file  Occupational History  . Not on file  Tobacco Use  . Smoking status: Former Smoker    Packs/day: 1.00    Years: 15.00    Pack years: 15.00    Quit date: 01/26/1971    Years since quitting: 48.9  . Smokeless tobacco: Never Used  Vaping Use  . Vaping Use: Never used  Substance and Sexual Activity  . Alcohol use: No  . Drug use: No  . Sexual activity: Not on file  Other Topics Concern  . Not on file  Social History Narrative  . Not on file   Social Determinants of Health   Financial Resource Strain:   . Difficulty of Paying Living Expenses:   Food Insecurity:   . Worried About Running  Out of Food in the Last Year:   . [place] in the Last Year:   Transportation Needs:   . Lack of Transportation (Medical):   Marland Kitchen Lack of Transportation (Non-Medical):   Physical Activity:   . Days of Exercise per Week:   . Minutes of Exercise per Session:   Stress:   . Feeling of Stress :   Social Connections:   . Frequency of Communication with Friends and Family:   . Frequency of Social Gatherings with Friends and Family:   . Attends Religious Services:   . Active Member of Clubs or Organizations:   . Attends Archivist Meetings:   Marland Kitchen Marital Status:   Intimate Partner Violence:   . Fear  of Current or Ex-Partner:   . Emotionally Abused:   Marland Kitchen Physically Abused:   . Sexually Abused:     FAMILY HISTORY:  Family History  Problem Relation Age of Onset  . CAD Father 78  . Cancer Mother   . Alzheimer's disease Mother   . Colon cancer Neg Hx     CURRENT MEDICATIONS:  Current Outpatient Medications  Medication Sig Dispense Refill  . allopurinol (ZYLOPRIM) 300 MG tablet Take 1 tablet (300 mg total) by mouth daily. 30 tablet 1  . carvedilol (COREG) 6.25 MG tablet Take 1 tablet (6.25 mg total) by mouth 2 (two) times daily. 180 tablet 3  . Cyanocobalamin (VITAMIN B12) 1000 MCG TBCR Take 1,000 mcg by mouth daily.     . Multiple Vitamin (MULITIVITAMIN WITH MINERALS) TABS Take 1 tablet by mouth daily.    Marland Kitchen omeprazole (PRILOSEC) 20 MG capsule 1 PO 30 MINS PRIOR TO BREAKFAST. 90 capsule 3  . sodium bicarbonate 650 MG tablet Take 1 tablet (650 mg total) by mouth 2 (two) times daily. (Patient taking differently: Take 325 mg by mouth 2 (two) times daily. ) 60 tablet 0  . tamsulosin (FLOMAX) 0.4 MG CAPS capsule Take 0.4 mg by mouth daily.     Marland Kitchen venetoclax 10 MG TABS Take 10 mg by mouth daily.    . prochlorperazine (COMPAZINE) 10 MG tablet Take 1 tablet (10 mg total) by mouth every 6 (six) hours as needed for nausea or vomiting. (Patient not taking: Reported on 12/26/2019) 45 tablet 2   Current Facility-Administered Medications  Medication Dose Route Frequency Provider Last Rate Last Admin  . sodium chloride 0.9 % bolus 1,000 mL  1,000 mL Intravenous Once Derek Jack, MD        ALLERGIES:  No Known Allergies  PHYSICAL EXAM:  Performance status (ECOG): 1 - Symptomatic but completely ambulatory  Vitals:   12/26/19 1056  BP: 119/65  Pulse: 82  Resp: 18  Temp: 97.7 F (36.5 C)  SpO2: 97%   Wt Readings from Last 3 Encounters:  12/26/19 (!) 216 lb 4.8 oz (98.1 kg)  12/16/19 221 lb 11.2 oz (100.6 kg)  12/12/19 217 lb 12.8 oz (98.8 kg)   Physical Exam Vitals reviewed.    Constitutional:      Appearance: Normal appearance.  Cardiovascular:     Rate and Rhythm: Normal rate and regular rhythm.     Pulses: Normal pulses.     Heart sounds: Normal heart sounds.  Pulmonary:     Effort: Pulmonary effort is normal.     Breath sounds: Normal breath sounds.  Musculoskeletal:     Right lower leg: No edema.     Left lower leg: No edema.  Neurological:     General:  No focal deficit present.     Mental Status: He is alert and oriented to person, place, and time.  Psychiatric:        Mood and Affect: Mood normal.        Behavior: Behavior normal.     LABORATORY DATA:  I have reviewed the labs as listed.  CBC Latest Ref Rng & Units 12/25/2019 12/15/2019 12/12/2019  WBC 4.0 - 10.5 K/uL 2.4(L) 7.2 16.7(H)  Hemoglobin 13.0 - 17.0 g/dL 9.4(L) 10.1(L) 9.7(L)  Hematocrit 39 - 52 % 29.4(L) 31.0(L) 30.7(L)  Platelets 150 - 400 K/uL 65(L) 73(L) 73(L)   CMP Latest Ref Rng & Units 12/25/2019 12/15/2019 12/12/2019  Glucose 70 - 99 mg/dL 97 104(H) 126(H)  BUN 8 - 23 mg/dL 83(H) 62(H) 57(H)  Creatinine 0.61 - 1.24 mg/dL 7.09(H) 5.81(H) 5.80(H)  Sodium 135 - 145 mmol/L 139 139 140  Potassium 3.5 - 5.1 mmol/L 4.3 4.1 3.6  Chloride 98 - 111 mmol/L 112(H) 111 110  CO2 22 - 32 mmol/L 17(L) 19(L) 19(L)  Calcium 8.9 - 10.3 mg/dL 8.7(L) 8.4(L) 8.4(L)  Total Protein 6.5 - 8.1 g/dL 6.2(L) 6.3(L) 6.2(L)  Total Bilirubin 0.3 - 1.2 mg/dL 0.7 0.9 0.7  Alkaline Phos 38 - 126 U/L 42 44 46  AST 15 - 41 U/L 16 12(L) 13(L)  ALT 0 - 44 U/L '16 11 11      ' Component Value Date/Time   RBC 2.84 (L) 12/25/2019 1035   MCV 103.5 (H) 12/25/2019 1035   MCH 33.1 12/25/2019 1035   MCHC 32.0 12/25/2019 1035   RDW 15.1 12/25/2019 1035   LYMPHSABS 0.6 (L) 12/25/2019 1035   MONOABS 0.3 12/25/2019 1035   EOSABS 0.0 12/25/2019 1035   BASOSABS 0.0 12/25/2019 1035   Lab Results  Component Value Date   LDH 103 12/25/2019   LDH 118 12/12/2019   LDH 132 12/08/2019    DIAGNOSTIC IMAGING:  I have  independently reviewed the scans and discussed with the patient. NM PET Image Initial (PI) Skull Base To Thigh  Result Date: 12/02/2019 CLINICAL DATA:  Subsequent treatment strategy for chronic lymphocytic leukemia. EXAM: NUCLEAR MEDICINE PET SKULL BASE TO THIGH TECHNIQUE: 12.6 mCi F-18 FDG was injected intravenously. Full-ring PET imaging was performed from the skull base to thigh after the radiotracer. CT data was obtained and used for attenuation correction and anatomic localization. Fasting blood glucose: 84 mg/dl COMPARISON:  PET-CT 11/04/2018 FINDINGS: Mediastinal blood pool activity: SUV max 1.8 Liver activity: SUV max 2.9 NECK: Again demonstrated multiple bilateral enlarged cervical lymph nodes. Nodes appears slightly larger than prior. For example LEFT level III lymph node (image 33) measures 12 mm compares to 7 mm on PET-CT 11/04/2018. Lesion has low metabolic activity SUV max equal 2.1. RIGHT level I lymph node on the RIGHT measures 12 mm compared to 8 mm (46/3) with SUV max equal 2.3. Incidental CT findings: none CHEST: Bilateral axillary and mediastinal adenopathy. Mediastinal lymph nodes increased in size moderately. For example prevascular node measuring 12 mm (image 88) compares to 10 mm. LEFT lower paratracheal node measures 12 mm compared with 7 mm. Lymph nodes have low metabolic activity similar to or less background blood pool. Axial lymph nodes are slightly enlarged with SUV max equal 1.9 compared to 1.3 on comparison scan Incidental CT findings: No suspicious nodularity. ABDOMEN/PELVIS: Minimal adenopathy in the retroperitoneum along the aorta. External iliac nodes are prominent. Example RIGHT external iliac lymph node measuring 14 mm compares to 13 mm. SUV max equal 2.0  Spleen is enlarged measures 16 cm craniocaudad dimension compared to 14.5 cm on prior no abnormal hypermetabolic activity. Incidental CT findings: Bilateral nonobstructing renal calculi. Prostate normal SKELETON: No abnormal  activity in bone marrow. Incidental CT findings: none IMPRESSION: 1. Mild increase in size of lymph nodes in the neck, axilla, and mediastinum accompanied by mild increase in metabolic activity. Activity is ranges from less than to slightly above background blood ( [place] 2/3) 2. Adenopathy in the abdomen and pelvis is similar. 3. Mild increase in volume of the spleen which has normal metabolic activity. Electronically Signed   By: Suzy Bouchard M.D.   On: 12/02/2019 08:16     ASSESSMENT:  1. CLL, Rai stage IV: -BMBX on 08/10/2016 shows bone marrow involvement by CLL. -He also had bulky diffuse adenopathy with progressive thrombocytopenia. -Imbruvica 420 mg from 08/19/2016 through May 2018, discontinued due to subdural hematoma on 10/14/2016. -Subsequently offered chlorambucil and obinutuzumab. He refused obinutuzumab. Chlorambucil 30 mg every 2 weeks from June 2019 through 11/07/2018 due to severe weakness. -PET scan on 12/01/2019 showed multiple bilateral enlarged cervical lymph nodes, bilateral axillary and mediastinal adenopathy, prevascular lymph nodes, left lower paratracheal lymph node, minimal adenopathy in the retroperitoneum, external iliac lymph nodes. Spleen measures 16 cm. All lymph nodes measure less than 5 cm with SUV less than 5. 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 IGHV was mutated. -T p53 mutation is pending. -Venetoclax 10 mg started on 12/02/2019 (dose reduced secondary to Coreg), dose increased to 50 mg daily on 12/12/2019.  2. 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   PLAN:  1. CLL, Rai stage IV: -Venetoclax increased 100 mg daily on 12/19/2019. -He does not have any GI related side effects.  No extreme fatigue.  He is drinking 100 ounces of water daily. -I reviewed his labs.  Creatinine increased to 7.09.  Phosphate also went up to 7.1.  Magnesium is  2.6.  Uric acid is 6.2. -White count is 2.4 with ANC of 1.5.  Platelets are 65.  Hemoglobin 9.4. -I have recommended holding venetoclax over the weekend.  I will recheck his labs Monday. -If the creatinine does not improve, will make a follow-up visit with Dr. Meredeth Ide.  2. CKD: -Creatinine has gone up to 7.09 from 5.8 110 days ago.  3. Tumor lysis prophylaxis: -Uric acid is 6.2.  However phosphate and magnesium are elevated at 7.1 and 2.6 respectively.  Potassium is normal. -he will receive 1 L of normal saline over 2 hours.  4. Macrocytic anemia: -Hemoglobin is 9.4 with MCV of 103.5.  Orders placed this encounter:  No orders of the defined types were placed in this encounter.    Derek Jack, MD [facility] [phone]   I, Milinda Antis, am acting as a scribe for Dr. Sanda Linger.  I, Derek Jack MD, have reviewed the above documentation for accuracy and completeness, and I agree with the above.

## 2019-12-29 ENCOUNTER — Other Ambulatory Visit: Payer: Self-pay

## 2019-12-29 ENCOUNTER — Inpatient Hospital Stay (HOSPITAL_COMMUNITY): Payer: Medicare HMO | Attending: Hematology

## 2019-12-29 DIAGNOSIS — Z79899 Other long term (current) drug therapy: Secondary | ICD-10-CM | POA: Insufficient documentation

## 2019-12-29 DIAGNOSIS — R5383 Other fatigue: Secondary | ICD-10-CM | POA: Insufficient documentation

## 2019-12-29 DIAGNOSIS — N184 Chronic kidney disease, stage 4 (severe): Secondary | ICD-10-CM | POA: Insufficient documentation

## 2019-12-29 DIAGNOSIS — I129 Hypertensive chronic kidney disease with stage 1 through stage 4 chronic kidney disease, or unspecified chronic kidney disease: Secondary | ICD-10-CM | POA: Insufficient documentation

## 2019-12-29 DIAGNOSIS — R200 Anesthesia of skin: Secondary | ICD-10-CM | POA: Insufficient documentation

## 2019-12-29 DIAGNOSIS — Z87820 Personal history of traumatic brain injury: Secondary | ICD-10-CM | POA: Insufficient documentation

## 2019-12-29 DIAGNOSIS — I480 Paroxysmal atrial fibrillation: Secondary | ICD-10-CM | POA: Diagnosis not present

## 2019-12-29 DIAGNOSIS — Z87891 Personal history of nicotine dependence: Secondary | ICD-10-CM | POA: Insufficient documentation

## 2019-12-29 DIAGNOSIS — C9110 Chronic lymphocytic leukemia of B-cell type not having achieved remission: Secondary | ICD-10-CM | POA: Diagnosis not present

## 2019-12-29 LAB — LACTATE DEHYDROGENASE: LDH: 108 U/L (ref 98–192)

## 2019-12-29 LAB — BASIC METABOLIC PANEL
Anion gap: 10 (ref 5–15)
BUN: 70 mg/dL — ABNORMAL HIGH (ref 8–23)
CO2: 17 mmol/L — ABNORMAL LOW (ref 22–32)
Calcium: 8.6 mg/dL — ABNORMAL LOW (ref 8.9–10.3)
Chloride: 111 mmol/L (ref 98–111)
Creatinine, Ser: 6.56 mg/dL — ABNORMAL HIGH (ref 0.61–1.24)
GFR calc Af Amer: 8 mL/min — ABNORMAL LOW (ref >60)
GFR calc non Af Amer: 7 mL/min — ABNORMAL LOW (ref >60)
Glucose, Bld: 107 mg/dL — ABNORMAL HIGH (ref 70–99)
Potassium: 4 mmol/L (ref 3.5–5.1)
Sodium: 138 mmol/L (ref 135–145)

## 2019-12-29 LAB — MISC LABCORP TEST (SEND OUT): Labcorp test code: 489590

## 2019-12-29 LAB — URIC ACID: Uric Acid, Serum: 4.3 mg/dL (ref 3.7–8.6)

## 2020-01-05 ENCOUNTER — Other Ambulatory Visit (HOSPITAL_COMMUNITY): Payer: Self-pay | Admitting: *Deleted

## 2020-01-05 MED ORDER — VENETOCLAX 100 MG PO TABS: 200.0000 mg | ORAL_TABLET | Freq: Every day | ORAL | 0 refills | Status: DC

## 2020-01-06 DIAGNOSIS — D649 Anemia, unspecified: Secondary | ICD-10-CM | POA: Diagnosis not present

## 2020-01-06 DIAGNOSIS — C9110 Chronic lymphocytic leukemia of B-cell type not having achieved remission: Secondary | ICD-10-CM | POA: Diagnosis not present

## 2020-01-06 DIAGNOSIS — I129 Hypertensive chronic kidney disease with stage 1 through stage 4 chronic kidney disease, or unspecified chronic kidney disease: Secondary | ICD-10-CM | POA: Diagnosis not present

## 2020-01-06 DIAGNOSIS — Z0189 Encounter for other specified special examinations: Secondary | ICD-10-CM | POA: Diagnosis not present

## 2020-01-06 DIAGNOSIS — E782 Mixed hyperlipidemia: Secondary | ICD-10-CM | POA: Diagnosis not present

## 2020-01-06 DIAGNOSIS — N184 Chronic kidney disease, stage 4 (severe): Secondary | ICD-10-CM | POA: Diagnosis not present

## 2020-01-06 DIAGNOSIS — I10 Essential (primary) hypertension: Secondary | ICD-10-CM | POA: Diagnosis not present

## 2020-01-06 DIAGNOSIS — I4891 Unspecified atrial fibrillation: Secondary | ICD-10-CM | POA: Diagnosis not present

## 2020-01-06 DIAGNOSIS — D696 Thrombocytopenia, unspecified: Secondary | ICD-10-CM | POA: Diagnosis not present

## 2020-01-08 MED FILL — VENCLEXTA 100 MG TABS: 100 | 30 days supply | Qty: 60 | Fill #0

## 2020-01-11 ENCOUNTER — Encounter (HOSPITAL_COMMUNITY): Payer: Self-pay

## 2020-01-12 ENCOUNTER — Other Ambulatory Visit (HOSPITAL_COMMUNITY): Payer: Self-pay | Admitting: *Deleted

## 2020-01-12 ENCOUNTER — Encounter (HOSPITAL_COMMUNITY): Payer: Self-pay

## 2020-01-12 ENCOUNTER — Encounter (HOSPITAL_COMMUNITY): Payer: Self-pay | Admitting: *Deleted

## 2020-01-12 DIAGNOSIS — C9110 Chronic lymphocytic leukemia of B-cell type not having achieved remission: Secondary | ICD-10-CM

## 2020-01-14 ENCOUNTER — Other Ambulatory Visit (HOSPITAL_COMMUNITY): Payer: Self-pay | Admitting: *Deleted

## 2020-01-14 MED ORDER — ALLOPURINOL 300 MG PO TABS: 300.0000 mg | ORAL_TABLET | Freq: Every day | ORAL | 1 refills | Status: DC

## 2020-01-15 ENCOUNTER — Inpatient Hospital Stay (HOSPITAL_COMMUNITY): Payer: Medicare HMO | Admitting: Hematology

## 2020-01-15 ENCOUNTER — Other Ambulatory Visit: Payer: Self-pay

## 2020-01-15 ENCOUNTER — Encounter (HOSPITAL_COMMUNITY): Payer: Self-pay | Admitting: Hematology

## 2020-01-15 ENCOUNTER — Inpatient Hospital Stay (HOSPITAL_COMMUNITY): Payer: Medicare HMO

## 2020-01-15 VITALS — BP 122/77 | HR 68 | Temp 98.6°F | Resp 16 | Wt 214.7 lb

## 2020-01-15 DIAGNOSIS — L570 Actinic keratosis: Secondary | ICD-10-CM | POA: Diagnosis not present

## 2020-01-15 DIAGNOSIS — C9110 Chronic lymphocytic leukemia of B-cell type not having achieved remission: Secondary | ICD-10-CM

## 2020-01-15 DIAGNOSIS — X32XXXD Exposure to sunlight, subsequent encounter: Secondary | ICD-10-CM | POA: Diagnosis not present

## 2020-01-15 DIAGNOSIS — D0421 Carcinoma in situ of skin of right ear and external auricular canal: Secondary | ICD-10-CM | POA: Diagnosis not present

## 2020-01-15 DIAGNOSIS — C4442 Squamous cell carcinoma of skin of scalp and neck: Secondary | ICD-10-CM | POA: Diagnosis not present

## 2020-01-15 LAB — CBC WITH DIFFERENTIAL/PLATELET
Abs Immature Granulocytes: 0.01 10*3/uL (ref 0.00–0.07)
Basophils Absolute: 0 10*3/uL (ref 0.0–0.1)
Basophils Relative: 1 %
Eosinophils Absolute: 0.1 10*3/uL (ref 0.0–0.5)
Eosinophils Relative: 5 %
HCT: 30 % — ABNORMAL LOW (ref 39.0–52.0)
Hemoglobin: 9.7 g/dL — ABNORMAL LOW (ref 13.0–17.0)
Immature Granulocytes: 0 %
Lymphocytes Relative: 25 %
Lymphs Abs: 0.7 10*3/uL (ref 0.7–4.0)
MCH: 32.8 pg (ref 26.0–34.0)
MCHC: 32.3 g/dL (ref 30.0–36.0)
MCV: 101.4 fL — ABNORMAL HIGH (ref 80.0–100.0)
Monocytes Absolute: 0.3 10*3/uL (ref 0.1–1.0)
Monocytes Relative: 9 %
Neutro Abs: 1.8 10*3/uL (ref 1.7–7.7)
Neutrophils Relative %: 60 %
Platelets: 51 10*3/uL — ABNORMAL LOW (ref 150–400)
RBC: 2.96 MIL/uL — ABNORMAL LOW (ref 4.22–5.81)
RDW: 14.8 % (ref 11.5–15.5)
WBC: 3 10*3/uL — ABNORMAL LOW (ref 4.0–10.5)
nRBC: 0 % (ref 0.0–0.2)

## 2020-01-15 LAB — COMPREHENSIVE METABOLIC PANEL
ALT: 14 U/L (ref 0–44)
AST: 14 U/L — ABNORMAL LOW (ref 15–41)
Albumin: 4.1 g/dL (ref 3.5–5.0)
Alkaline Phosphatase: 38 U/L (ref 38–126)
Anion gap: 11 (ref 5–15)
BUN: 73 mg/dL — ABNORMAL HIGH (ref 8–23)
CO2: 18 mmol/L — ABNORMAL LOW (ref 22–32)
Calcium: 8.8 mg/dL — ABNORMAL LOW (ref 8.9–10.3)
Chloride: 110 mmol/L (ref 98–111)
Creatinine, Ser: 6.47 mg/dL — ABNORMAL HIGH (ref 0.61–1.24)
GFR calc Af Amer: 9 mL/min — ABNORMAL LOW (ref >60)
GFR calc non Af Amer: 7 mL/min — ABNORMAL LOW (ref >60)
Glucose, Bld: 136 mg/dL — ABNORMAL HIGH (ref 70–99)
Potassium: 3.9 mmol/L (ref 3.5–5.1)
Sodium: 139 mmol/L (ref 135–145)
Total Bilirubin: 0.7 mg/dL (ref 0.3–1.2)
Total Protein: 6.3 g/dL — ABNORMAL LOW (ref 6.5–8.1)

## 2020-01-15 LAB — MAGNESIUM: Magnesium: 2.3 mg/dL (ref 1.7–2.4)

## 2020-01-15 LAB — PHOSPHORUS: Phosphorus: 5 mg/dL — ABNORMAL HIGH (ref 2.5–4.6)

## 2020-01-15 LAB — LACTATE DEHYDROGENASE: LDH: 124 U/L (ref 98–192)

## 2020-01-15 LAB — URIC ACID: Uric Acid, Serum: 4.1 mg/dL (ref 3.7–8.6)

## 2020-01-15 MED ORDER — ALLOPURINOL 300 MG PO TABS
300.0000 mg | ORAL_TABLET | Freq: Every day | ORAL | 1 refills | Status: DC
Start: 2020-01-15 — End: 2020-03-11

## 2020-01-15 NOTE — Progress Notes (Signed)
[facility] [address], [place] [address]   CLINIC:  Medical Oncology/Hematology  PCP:  [name], MD [address] [place] [address]  [phone]  REASON FOR VISIT:  Follow-up for CLL  PRIOR THERAPY:  1. Imbruvica 420 mg from 08/19/2016 through 09/2016. 2. Chlorambucil 30 mg from 10/2017 through 11/07/2018.  CURRENT THERAPY: Venetoclax  INTERVAL HISTORY:  [name], a 79 y.o. male, returns for routine follow-up for his CLL. Andrew [name] was last seen on 12/26/2019.  Today Andrew [name] is accompanied by his wife. Andrew [name] has received his next bottle of venetoclax already; Andrew [name] is tolerating it well so far. Andrew [name] continues to drink 100 ounces of water daily. His appetite is good and Andrew [name] denies N/V. Andrew [name] is urinating regularly and denies F/C or night sweats. His energy levels are improved according to his wife. Andrew [name] continues having numbness in his toes, which is stable.   REVIEW OF SYSTEMS:  Review of Systems  Constitutional: Positive for fatigue (mild). Negative for appetite change, chills, diaphoresis and fever.  Gastrointestinal: Negative for nausea and vomiting.  Genitourinary: Negative for difficulty urinating.   Neurological: Positive for numbness (toes).  All other systems reviewed and are negative.   PAST MEDICAL/SURGICAL HISTORY:  Past Medical History:  Diagnosis Date  . CKD (chronic kidney disease), stage IV ([facility])   . CLL (chronic lymphocytic leukemia) ([facility] [facility])   . Hypertension   . NICM (nonischemic cardiomyopathy) ([facility])    a. cath 2007, EF 35%, minimal CAD. b. EF normal 2018.  Marland Kitchen PAF (paroxysmal atrial fibrillation) ([facility])   . Pinched nerve in neck   . SDH (subdural hematoma) ([facility])   . Thrombocytopenia ([facility])    Past Surgical History:  Procedure Laterality Date  . BIOPSY  08/05/2018   Procedure: BIOPSY;  Surgeon: Danie Binder, MD;  [facility];  Service: Endoscopy;;  gastric   . CATARACT EXTRACTION, BILATERAL    . COLONOSCOPY   07/10/2011   Procedure: COLONOSCOPY;  Surgeon: Dorothyann Peng, MD;  [facility];  Service: Endoscopy;  Laterality: N/A;  10:30 AM  . CRANIOTOMY N/A 10/14/2016   Procedure: CRANIOTOMY HEMATOMA EVACUATION SUBDURAL;  Surgeon: Jovita Gamma, MD;  Location: [facility];  Service: Neurosurgery;  Laterality: N/A;  CRANIOTOMY HEMATOMA EVACUATION SUBDURAL  . ESOPHAGOGASTRODUODENOSCOPY N/A 08/05/2018   Procedure: ESOPHAGOGASTRODUODENOSCOPY (EGD);  Surgeon: Danie Binder, MD;  [facility];  Service: Endoscopy;  Laterality: N/A;  3:00pm  . THYROIDECTOMY, PARTIAL    . TONSILLECTOMY      SOCIAL HISTORY:  Social History   Socioeconomic History  . Marital status: Married    Spouse name: Not on file  . Number of children: Not on file  . Years of education: Not on file  . Highest education level: Not on file  Occupational History  . Not on file  Tobacco Use  . Smoking status: Former Smoker    Packs/day: 1.00    Years: 15.00    Pack years: 15.00    Quit date: 01/26/1971    Years since quitting: 49.0  . Smokeless tobacco: Never Used  Vaping Use  . Vaping Use: Never used  Substance and Sexual Activity  . Alcohol use: No  . Drug use: No  . Sexual activity: Not on file  Other Topics Concern  . Not on file  Social History Narrative  . Not on file   Social Determinants of Health   Financial Resource Strain:   .  Difficulty of Paying Living Expenses: Not on file  Food Insecurity:   . Worried About Charity fundraiser in the Last Year: Not on file  . Ran Out of Food in the Last Year: Not on file  Transportation Needs:   . Lack of Transportation (Medical): Not on file  . Lack of Transportation (Non-Medical): Not on file  Physical Activity:   . Days of Exercise per Week: Not on file  . Minutes of Exercise per Session: Not on file  Stress:   . Feeling of Stress : Not on file  Social Connections:   . Frequency of Communication with Friends and Family: Not on file  .  Frequency of Social Gatherings with Friends and Family: Not on file  . Attends Religious Services: Not on file  . Active Member of Clubs or Organizations: Not on file  . Attends Archivist Meetings: Not on file  . Marital Status: Not on file  Intimate Partner Violence:   . Fear of Current or Ex-Partner: Not on file  . Emotionally Abused: Not on file  . Physically Abused: Not on file  . Sexually Abused: Not on file    FAMILY HISTORY:  Family History  Problem Relation Age of Onset  . CAD Father 63  . Cancer Mother   . Alzheimer's disease Mother   . Colon cancer Neg Hx     CURRENT MEDICATIONS:  Current Outpatient Medications  Medication Sig Dispense Refill  . allopurinol (ZYLOPRIM) 300 MG tablet Take 1 tablet (300 mg total) by mouth daily. 30 tablet 1  . carvedilol (COREG) 6.25 MG tablet Take 1 tablet (6.25 mg total) by mouth 2 (two) times daily. 180 tablet 3  . Cyanocobalamin (VITAMIN B12) 1000 MCG TBCR Take 1,000 mcg by mouth daily.     . Multiple Vitamin (MULITIVITAMIN WITH MINERALS) TABS Take 1 tablet by mouth daily.    Marland Kitchen omeprazole (PRILOSEC) 20 MG capsule 1 PO 30 MINS PRIOR TO BREAKFAST. 90 capsule 3  . prochlorperazine (COMPAZINE) 10 MG tablet Take 1 tablet (10 mg total) by mouth every 6 (six) hours as needed for nausea or vomiting. (Patient not taking: Reported on 12/26/2019) 45 tablet 2  . sodium bicarbonate 650 MG tablet Take 1 tablet (650 mg total) by mouth 2 (two) times daily. (Patient taking differently: Take 325 mg by mouth 2 (two) times daily. ) 60 tablet 0  . tamsulosin (FLOMAX) 0.4 MG CAPS capsule Take 0.4 mg by mouth daily.     Marland Kitchen venetoclax 100 MG TABS Take 200 mg by mouth daily. 60 tablet 0   No current facility-administered medications for this visit.    ALLERGIES:  No Known Allergies  PHYSICAL EXAM:  Performance status (ECOG): 1 - Symptomatic but completely ambulatory  Vitals:   01/15/20 0903  BP: 122/77  Pulse: 68  Resp: 16  Temp: 98.6 F  (37 C)  SpO2: 99%   Wt Readings from Last 3 Encounters:  01/15/20 214 lb 11.2 oz (97.4 kg)  12/26/19 (!) 216 lb 4.8 oz (98.1 kg)  12/16/19 221 lb 11.2 oz (100.6 kg)   Physical Exam Vitals reviewed.  Constitutional:      Appearance: Normal appearance.  Cardiovascular:     Rate and Rhythm: Normal rate and regular rhythm.     Pulses: Normal pulses.     Heart sounds: Normal heart sounds.  Pulmonary:     Effort: Pulmonary effort is normal.     Breath sounds: Normal breath sounds.  Abdominal:  Palpations: Abdomen is soft. There is no mass.     Tenderness: There is no abdominal tenderness.  Musculoskeletal:     Right lower leg: No edema.     Left lower leg: No edema.  Lymphadenopathy:     Upper Body:     Right upper body: No supraclavicular or axillary adenopathy.     Left upper body: No supraclavicular or axillary adenopathy.  Neurological:     General: No focal deficit present.     Mental Status: Andrew [name] is alert and oriented to person, place, and time.  Psychiatric:        Mood and Affect: Mood normal.        Behavior: Behavior normal.     LABORATORY DATA:  I have reviewed the labs as listed.  CBC Latest Ref Rng & Units 01/15/2020 12/25/2019 12/15/2019  WBC 4.0 - 10.5 K/uL 3.0(L) 2.4(L) 7.2  Hemoglobin 13.0 - 17.0 g/dL 9.7(L) 9.4(L) 10.1(L)  Hematocrit 39 - 52 % 30.0(L) 29.4(L) 31.0(L)  Platelets 150 - 400 K/uL 51(L) 65(L) 73(L)   CMP Latest Ref Rng & Units 01/15/2020 12/29/2019 12/25/2019  Glucose 70 - 99 mg/dL 136(H) 107(H) 97  BUN 8 - 23 mg/dL 73(H) 70(H) 83(H)  Creatinine 0.61 - 1.24 mg/dL 6.47(H) 6.56(H) 7.09(H)  Sodium 135 - 145 mmol/L 139 138 139  Potassium 3.5 - 5.1 mmol/L 3.9 4.0 4.3  Chloride 98 - 111 mmol/L 110 111 112(H)  CO2 22 - 32 mmol/L 18(L) 17(L) 17(L)  Calcium 8.9 - 10.3 mg/dL 8.8(L) 8.6(L) 8.7(L)  Total Protein 6.5 - 8.1 g/dL 6.3(L) - 6.2(L)  Total Bilirubin 0.3 - 1.2 mg/dL 0.7 - 0.7  Alkaline Phos 38 - 126 U/L 38 - 42  AST 15 - 41 U/L 14(L) - 16    ALT 0 - 44 U/L 14 - 16      Component Value Date/Time   RBC 2.96 (L) 01/15/2020 0830   MCV 101.4 (H) 01/15/2020 0830   MCH 32.8 01/15/2020 0830   MCHC 32.3 01/15/2020 0830   RDW 14.8 01/15/2020 0830   LYMPHSABS 0.7 01/15/2020 0830   MONOABS 0.3 01/15/2020 0830   EOSABS 0.1 01/15/2020 0830   BASOSABS 0.0 01/15/2020 0830   Lab Results  Component Value Date   LDH 124 01/15/2020   LDH 108 12/29/2019   LDH 103 12/25/2019    DIAGNOSTIC IMAGING:  I have independently reviewed the scans and discussed with the patient. No results found.   ASSESSMENT:  1. CLL, Rai stage IV: -BMBX on 08/10/2016 shows bone marrow involvement by CLL. -Andrew [name] also had bulky diffuse adenopathy with progressive thrombocytopenia. -Imbruvica 420 mg from 08/19/2016 through May 2018, discontinued due to subdural hematoma on 10/14/2016. -Subsequently offered chlorambucil and obinutuzumab. Andrew [name] refused obinutuzumab. Chlorambucil 30 mg every 2 weeks from June 2019 through 11/07/2018 due to severe weakness. -PET scan on 12/01/2019 showed multiple bilateral enlarged cervical lymph nodes, bilateral axillary and mediastinal adenopathy, prevascular lymph nodes, left lower paratracheal lymph node, minimal adenopathy in the retroperitoneum, external iliac lymph nodes. Spleen measures 16 cm. All lymph nodes measure less than 5 cm with SUV less than 5. 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 IGHV was mutated. -T p53 mutation is pending. -Venetoclax 10 mg started on 12/02/2019 (dose reduced secondary to Coreg), dose increased to 50 mg daily on 12/12/2019. -Dose increased to 100 mg daily on 12/19/2019, held on 12/25/2019 due to elevated creatinine.  2. CKD: -Kidney biopsy on 12/19/2017 shows CLL involvement with diffuse severe interstitial  nephritis and associated tubular centric granulomatous reaction.   PLAN:  1. CLL, Rai stage IV: -Andrew [name] is currently off of venetoclax since 12/25/2019. -I have reviewed his labs.  Creatinine is  6.47.  BUN is 73.  White count is 3.0 with platelet count of 51.  Platelet count is dropping. -I have recommended start back on venetoclax 100 mg daily. -I will see him back in 2 weeks for follow-up with repeat labs.  2. CKD: -Creatinine is 6.47.  Creatinine on 12/25/2019 was 7.09.  3. Tumor lysis prophylaxis: -Uric acid is normal at 4.1.  However phosphate is slightly high at 5.0 but improved from previous value.  Magnesium and potassium was normal. -Andrew [name] will continue allopurinol 300 mg daily for another 30 days.  4. Macrocytic anemia: -Hemoglobin is 9.7 with MCV of 101.  No transfusion needed.  Orders placed this encounter:  No orders of the defined types were placed in this encounter.    Derek Jack, MD [place] [facility] [phone]   I, Milinda Antis, am acting as a scribe for Dr. Sanda Linger.  I, Derek Jack MD, have reviewed the above documentation for accuracy and completeness, and I agree with the above.

## 2020-01-15 NOTE — Patient Instructions (Addendum)
[place] at [facility] Discharge Instructions  You were seen today by Dr. Delton Coombes. He went over your recent results. Start taking venetoclax 100 mg daily from tomorrow. Dr. Delton Coombes will see you back in 2 weeks for labs and follow up.   Thank you for choosing [place] at [facility] to provide your oncology and hematology care.  To afford each patient quality time with our provider, please arrive at least 15 minutes before your scheduled appointment time.   If you have a lab appointment with the [facility] please come in thru the Main Entrance and check in at the main information desk  You need to re-schedule your appointment should you arrive 10 or more minutes late.  We strive to give you quality time with our providers, and arriving late affects you and other patients whose appointments are after yours.  Also, if you no show three or more times for appointments you may be dismissed from the clinic at the providers discretion.     Again, thank you for choosing [facility].  Our hope is that these requests will decrease the amount of time that you wait before being seen by our physicians.       _____________________________________________________________  Should you have questions after your visit to [facility], please contact our office at [phone] between the hours of 8:00 a.m. and 4:30 p.m.  Voicemails left after 4:00 p.m. will not be returned until the following business day.  For prescription refill requests, have your pharmacy contact our office and allow 72 hours.    Cancer Center Support Programs:   > Cancer Support Group  2nd Tuesday of the month 1pm-2pm, Journey Room

## 2020-01-19 DIAGNOSIS — N200 Calculus of kidney: Secondary | ICD-10-CM | POA: Diagnosis not present

## 2020-01-19 DIAGNOSIS — C9110 Chronic lymphocytic leukemia of B-cell type not having achieved remission: Secondary | ICD-10-CM | POA: Diagnosis not present

## 2020-01-19 DIAGNOSIS — D696 Thrombocytopenia, unspecified: Secondary | ICD-10-CM | POA: Diagnosis not present

## 2020-01-19 DIAGNOSIS — Z8616 Personal history of COVID-19: Secondary | ICD-10-CM | POA: Diagnosis not present

## 2020-01-19 DIAGNOSIS — N179 Acute kidney failure, unspecified: Secondary | ICD-10-CM | POA: Diagnosis not present

## 2020-01-19 DIAGNOSIS — I129 Hypertensive chronic kidney disease with stage 1 through stage 4 chronic kidney disease, or unspecified chronic kidney disease: Secondary | ICD-10-CM | POA: Diagnosis not present

## 2020-01-19 DIAGNOSIS — N12 Tubulo-interstitial nephritis, not specified as acute or chronic: Secondary | ICD-10-CM | POA: Diagnosis not present

## 2020-01-19 DIAGNOSIS — N184 Chronic kidney disease, stage 4 (severe): Secondary | ICD-10-CM | POA: Diagnosis not present

## 2020-01-29 ENCOUNTER — Other Ambulatory Visit: Payer: Self-pay

## 2020-01-29 DIAGNOSIS — N185 Chronic kidney disease, stage 5: Secondary | ICD-10-CM

## 2020-01-30 ENCOUNTER — Other Ambulatory Visit (HOSPITAL_COMMUNITY): Payer: Self-pay

## 2020-01-30 DIAGNOSIS — C9110 Chronic lymphocytic leukemia of B-cell type not having achieved remission: Secondary | ICD-10-CM

## 2020-01-30 DIAGNOSIS — N179 Acute kidney failure, unspecified: Secondary | ICD-10-CM

## 2020-02-03 ENCOUNTER — Encounter (HOSPITAL_COMMUNITY): Payer: Self-pay | Admitting: Hematology

## 2020-02-03 ENCOUNTER — Other Ambulatory Visit: Payer: Self-pay

## 2020-02-03 ENCOUNTER — Inpatient Hospital Stay (HOSPITAL_COMMUNITY): Payer: Medicare HMO | Attending: Hematology | Admitting: Hematology

## 2020-02-03 ENCOUNTER — Inpatient Hospital Stay (HOSPITAL_COMMUNITY): Payer: Medicare HMO

## 2020-02-03 VITALS — BP 149/90 | HR 75 | Temp 97.0°F | Resp 18 | Wt 218.2 lb

## 2020-02-03 DIAGNOSIS — Z23 Encounter for immunization: Secondary | ICD-10-CM | POA: Insufficient documentation

## 2020-02-03 DIAGNOSIS — C9110 Chronic lymphocytic leukemia of B-cell type not having achieved remission: Secondary | ICD-10-CM | POA: Diagnosis not present

## 2020-02-03 DIAGNOSIS — D696 Thrombocytopenia, unspecified: Secondary | ICD-10-CM | POA: Insufficient documentation

## 2020-02-03 DIAGNOSIS — I10 Essential (primary) hypertension: Secondary | ICD-10-CM | POA: Insufficient documentation

## 2020-02-03 DIAGNOSIS — N184 Chronic kidney disease, stage 4 (severe): Secondary | ICD-10-CM | POA: Insufficient documentation

## 2020-02-03 DIAGNOSIS — Z79899 Other long term (current) drug therapy: Secondary | ICD-10-CM | POA: Diagnosis not present

## 2020-02-03 DIAGNOSIS — I480 Paroxysmal atrial fibrillation: Secondary | ICD-10-CM | POA: Diagnosis not present

## 2020-02-03 DIAGNOSIS — D539 Nutritional anemia, unspecified: Secondary | ICD-10-CM | POA: Insufficient documentation

## 2020-02-03 DIAGNOSIS — Z87891 Personal history of nicotine dependence: Secondary | ICD-10-CM | POA: Insufficient documentation

## 2020-02-03 DIAGNOSIS — N179 Acute kidney failure, unspecified: Secondary | ICD-10-CM

## 2020-02-03 LAB — COMPREHENSIVE METABOLIC PANEL
ALT: 15 U/L (ref 0–44)
AST: 17 U/L (ref 15–41)
Albumin: 3.9 g/dL (ref 3.5–5.0)
Alkaline Phosphatase: 38 U/L (ref 38–126)
Anion gap: 10 (ref 5–15)
BUN: 64 mg/dL — ABNORMAL HIGH (ref 8–23)
CO2: 18 mmol/L — ABNORMAL LOW (ref 22–32)
Calcium: 8.6 mg/dL — ABNORMAL LOW (ref 8.9–10.3)
Chloride: 111 mmol/L (ref 98–111)
Creatinine, Ser: 5.78 mg/dL — ABNORMAL HIGH (ref 0.61–1.24)
GFR calc Af Amer: 10 mL/min — ABNORMAL LOW (ref >60)
GFR calc non Af Amer: 9 mL/min — ABNORMAL LOW (ref >60)
Glucose, Bld: 117 mg/dL — ABNORMAL HIGH (ref 70–99)
Potassium: 3.8 mmol/L (ref 3.5–5.1)
Sodium: 139 mmol/L (ref 135–145)
Total Bilirubin: 0.9 mg/dL (ref 0.3–1.2)
Total Protein: 6.1 g/dL — ABNORMAL LOW (ref 6.5–8.1)

## 2020-02-03 LAB — CBC WITH DIFFERENTIAL/PLATELET
Abs Immature Granulocytes: 0 10*3/uL (ref 0.00–0.07)
Basophils Absolute: 0 10*3/uL (ref 0.0–0.1)
Basophils Relative: 1 %
Eosinophils Absolute: 0 10*3/uL (ref 0.0–0.5)
Eosinophils Relative: 1 %
HCT: 28.4 % — ABNORMAL LOW (ref 39.0–52.0)
Hemoglobin: 9.6 g/dL — ABNORMAL LOW (ref 13.0–17.0)
Immature Granulocytes: 0 %
Lymphocytes Relative: 30 %
Lymphs Abs: 0.6 10*3/uL — ABNORMAL LOW (ref 0.7–4.0)
MCH: 34 pg (ref 26.0–34.0)
MCHC: 33.8 g/dL (ref 30.0–36.0)
MCV: 100.7 fL — ABNORMAL HIGH (ref 80.0–100.0)
Monocytes Absolute: 0.3 10*3/uL (ref 0.1–1.0)
Monocytes Relative: 13 %
Neutro Abs: 1.1 10*3/uL — ABNORMAL LOW (ref 1.7–7.7)
Neutrophils Relative %: 55 %
Platelets: 68 10*3/uL — ABNORMAL LOW (ref 150–400)
RBC: 2.82 MIL/uL — ABNORMAL LOW (ref 4.22–5.81)
RDW: 14.8 % (ref 11.5–15.5)
WBC: 2 10*3/uL — ABNORMAL LOW (ref 4.0–10.5)
nRBC: 0 % (ref 0.0–0.2)

## 2020-02-03 LAB — MAGNESIUM: Magnesium: 2.5 mg/dL — ABNORMAL HIGH (ref 1.7–2.4)

## 2020-02-03 NOTE — Progress Notes (Signed)
[facility] [address], [place] [address]   CLINIC:  Medical Oncology/Hematology  PCP:  [name], MD [address] [place] [address]  [phone]  REASON FOR VISIT:  Follow-up for CLL  PRIOR THERAPY:  1. Imbruvica 420 mg from 08/19/2016 through 09/2016. 2. Chlorambucil 30 mg from 10/2017 through 11/07/2018.  CURRENT THERAPY: Venetoclax daily  INTERVAL HISTORY:  [name], a 79 y.o. male, returns for routine follow-up for his CLL. Andrew [name] was last seen on 01/15/2020.  Today he is accompanied by his wife. He reports feeling well. His appetite is good and he denies having any CP or SOB. He denies having abdominal pain or N/V/D. He is taking venetoclax 100 mg daily and continues drinking 100 ounces of water daily.   REVIEW OF SYSTEMS:  Review of Systems  Constitutional: Positive for fatigue. Negative for appetite change.  Respiratory: Negative for shortness of breath.   Cardiovascular: Negative for chest pain.  Gastrointestinal: Negative for abdominal pain, diarrhea, nausea and vomiting.  Neurological: Positive for numbness (toes).  All other systems reviewed and are negative.   PAST MEDICAL/SURGICAL HISTORY:  Past Medical History:  Diagnosis Date  . CKD (chronic kidney disease), stage IV ([place])   . CLL (chronic lymphocytic leukemia) ([place])   . Hypertension   . NICM (nonischemic cardiomyopathy) ([place])    a. cath 2007, EF 35%, minimal CAD. b. EF normal 2018.  Marland Kitchen PAF (paroxysmal atrial fibrillation) ([place])   . Pinched nerve in neck   . SDH (subdural hematoma) ([place])   . Thrombocytopenia ([place])    Past Surgical History:  Procedure Laterality Date  . BIOPSY  08/05/2018   Procedure: BIOPSY;  Surgeon: Danie Binder, MD;  [facility];  Service: Endoscopy;;  gastric   . CATARACT EXTRACTION, BILATERAL    . COLONOSCOPY  07/10/2011   Procedure: COLONOSCOPY;  Surgeon: Dorothyann Peng, MD;  [facility];  Service:  Endoscopy;  Laterality: N/A;  10:30 AM  . CRANIOTOMY N/A 10/14/2016   Procedure: CRANIOTOMY HEMATOMA EVACUATION SUBDURAL;  Surgeon: Jovita Gamma, MD;  Location: [facility];  Service: Neurosurgery;  Laterality: N/A;  CRANIOTOMY HEMATOMA EVACUATION SUBDURAL  . ESOPHAGOGASTRODUODENOSCOPY N/A 08/05/2018   Procedure: ESOPHAGOGASTRODUODENOSCOPY (EGD);  Surgeon: Danie Binder, MD;  [facility];  Service: Endoscopy;  Laterality: N/A;  3:00pm  . THYROIDECTOMY, PARTIAL    . TONSILLECTOMY      SOCIAL HISTORY:  Social History   Socioeconomic History  . Marital status: Married    Spouse name: Not on file  . Number of children: Not on file  . Years of education: Not on file  . Highest education level: Not on file  Occupational History  . Not on file  Tobacco Use  . Smoking status: Former Smoker    Packs/day: 1.00    Years: 15.00    Pack years: 15.00    Quit date: 01/26/1971    Years since quitting: 49.0  . Smokeless tobacco: Never Used  Vaping Use  . Vaping Use: Never used  Substance and Sexual Activity  . Alcohol use: No  . Drug use: No  . Sexual activity: Not on file  Other Topics Concern  . Not on file  Social History Narrative  . Not on file   Social Determinants of Health   Financial Resource Strain:   . Difficulty of Paying Living Expenses: Not on file  Food Insecurity:   . Worried  About Running Out of Food in the Last Year: Not on file  . Ran Out of Food in the Last Year: Not on file  Transportation Needs:   . Lack of Transportation (Medical): Not on file  . Lack of Transportation (Non-Medical): Not on file  Physical Activity:   . Days of Exercise per Week: Not on file  . Minutes of Exercise per Session: Not on file  Stress:   . Feeling of Stress : Not on file  Social Connections:   . Frequency of Communication with Friends and Family: Not on file  . Frequency of Social Gatherings with Friends and Family: Not on file  . Attends Religious Services: Not on  file  . Active Member of Clubs or Organizations: Not on file  . Attends Archivist Meetings: Not on file  . Marital Status: Not on file  Intimate Partner Violence:   . Fear of Current or Ex-Partner: Not on file  . Emotionally Abused: Not on file  . Physically Abused: Not on file  . Sexually Abused: Not on file    FAMILY HISTORY:  Family History  Problem Relation Age of Onset  . CAD Father 41  . Cancer Mother   . Alzheimer's disease Mother   . Colon cancer Neg Hx     CURRENT MEDICATIONS:  Current Outpatient Medications  Medication Sig Dispense Refill  . allopurinol (ZYLOPRIM) 300 MG tablet Take 1 tablet (300 mg total) by mouth daily. 30 tablet 1  . carvedilol (COREG) 6.25 MG tablet Take 1 tablet (6.25 mg total) by mouth 2 (two) times daily. 180 tablet 3  . Cyanocobalamin (VITAMIN B12) 1000 MCG TBCR Take 1,000 mcg by mouth daily.     . Multiple Vitamin (MULITIVITAMIN WITH MINERALS) TABS Take 1 tablet by mouth daily.    Marland Kitchen omeprazole (PRILOSEC) 20 MG capsule 1 PO 30 MINS PRIOR TO BREAKFAST. 90 capsule 3  . prochlorperazine (COMPAZINE) 10 MG tablet Take 1 tablet (10 mg total) by mouth every 6 (six) hours as needed for nausea or vomiting. (Patient not taking: Reported on 12/26/2019) 45 tablet 2  . sodium bicarbonate 650 MG tablet Take 1 tablet (650 mg total) by mouth 2 (two) times daily. (Patient taking differently: Take 325 mg by mouth 2 (two) times daily. ) 60 tablet 0  . tamsulosin (FLOMAX) 0.4 MG CAPS capsule Take 0.4 mg by mouth daily.     Marland Kitchen venetoclax 100 MG TABS Take 200 mg by mouth daily. 60 tablet 0   No current facility-administered medications for this visit.    ALLERGIES:  No Known Allergies  PHYSICAL EXAM:  Performance status (ECOG): 1 - Symptomatic but completely ambulatory  Vitals:   02/03/20 1455  BP: (!) 149/90  Pulse: 75  Resp: 18  Temp: (!) 97 F (36.1 C)  SpO2: 99%   Wt Readings from Last 3 Encounters:  02/03/20 218 lb 3.2 oz (99 kg)    01/15/20 214 lb 11.2 oz (97.4 kg)  12/26/19 (!) 216 lb 4.8 oz (98.1 kg)   Physical Exam Vitals reviewed.  Constitutional:      Appearance: Normal appearance.  Cardiovascular:     Rate and Rhythm: Normal rate and regular rhythm.     Pulses: Normal pulses.     Heart sounds: Normal heart sounds.  Pulmonary:     Effort: Pulmonary effort is normal.     Breath sounds: Normal breath sounds.  Abdominal:     Palpations: Abdomen is soft. There is no hepatomegaly, splenomegaly  or mass.     Tenderness: There is no abdominal tenderness.  Musculoskeletal:     Right lower leg: No edema.     Left lower leg: No edema.  Neurological:     General: No focal deficit present.     Mental Status: He is alert and oriented to person, place, and time.  Psychiatric:        Mood and Affect: Mood normal.        Behavior: Behavior normal.     LABORATORY DATA:  I have reviewed the labs as listed.  CBC Latest Ref Rng & Units 02/03/2020 01/15/2020 12/25/2019  WBC 4.0 - 10.5 K/uL 2.0(L) 3.0(L) 2.4(L)  Hemoglobin 13.0 - 17.0 g/dL 9.6(L) 9.7(L) 9.4(L)  Hematocrit 39 - 52 % 28.4(L) 30.0(L) 29.4(L)  Platelets 150 - 400 K/uL 68(L) 51(L) 65(L)   CMP Latest Ref Rng & Units 02/03/2020 01/15/2020 12/29/2019  Glucose 70 - 99 mg/dL 117(H) 136(H) 107(H)  BUN 8 - 23 mg/dL 64(H) 73(H) 70(H)  Creatinine 0.61 - 1.24 mg/dL 5.78(H) 6.47(H) 6.56(H)  Sodium 135 - 145 mmol/L 139 139 138  Potassium 3.5 - 5.1 mmol/L 3.8 3.9 4.0  Chloride 98 - 111 mmol/L 111 110 111  CO2 22 - 32 mmol/L 18(L) 18(L) 17(L)  Calcium 8.9 - 10.3 mg/dL 8.6(L) 8.8(L) 8.6(L)  Total Protein 6.5 - 8.1 g/dL 6.1(L) 6.3(L) -  Total Bilirubin 0.3 - 1.2 mg/dL 0.9 0.7 -  Alkaline Phos 38 - 126 U/L 38 38 -  AST 15 - 41 U/L 17 14(L) -  ALT 0 - 44 U/L 15 14 -      Component Value Date/Time   RBC 2.82 (L) 02/03/2020 1410   MCV 100.7 (H) 02/03/2020 1410   MCH 34.0 02/03/2020 1410   MCHC 33.8 02/03/2020 1410   RDW 14.8 02/03/2020 1410   LYMPHSABS 0.6 (L)  02/03/2020 1410   MONOABS 0.3 02/03/2020 1410   EOSABS 0.0 02/03/2020 1410   BASOSABS 0.0 02/03/2020 1410    DIAGNOSTIC IMAGING:  I have independently reviewed the scans and discussed with the patient. No results found.   ASSESSMENT:  1. CLL, Rai stage IV: -BMBX on 08/10/2016 shows bone marrow involvement by CLL. -He also had bulky diffuse adenopathy with progressive thrombocytopenia. -Imbruvica 420 mg from 08/19/2016 through May 2018, discontinued due to subdural hematoma on 10/14/2016. -Subsequently offered chlorambucil and obinutuzumab. He refused obinutuzumab. Chlorambucil 30 mg every 2 weeks from June 2019 through 11/07/2018 due to severe weakness. -PET scan on 12/01/2019 showed multiple bilateral enlarged cervical lymph nodes, bilateral axillary and mediastinal adenopathy, prevascular lymph nodes, left lower paratracheal lymph node, minimal adenopathy in the retroperitoneum, external iliac lymph nodes. Spleen measures 16 cm. All lymph nodes measure less than 5 cm with SUV less than 5. 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 IGHV was mutated. -T p53 mutation is pending. -Venetoclax 10 mg started on 12/02/2019 (dose reduced secondary to Coreg), dose increased to 50 mg daily on 12/12/2019. -Dose increased to 100 mg daily on 12/19/2019, held on 12/25/2019 due to elevated creatinine. -Venetoclax 100 mg daily started back on 01/15/2020.  2. 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   PLAN:  1. CLL, Rai stage IV: -He started back venetoclax on 01/15/2020. -He is tolerating it very well. -Reviewed labs from today which showed white count is 2.0 with ANC of 1.1.  Platelet count is mildly better at 68. -We will continue  venetoclax at the same dose.  We will see him back in 3 weeks. -We will think about CD20 antibody at that time.  2. CKD: -Creatinine improved to 5.78 from  6.47 previously.  3. Tumor lysis prophylaxis: -Uric acid at last visit was 5.0.  He will take allopurinol for another 5 days and stop it.  Will check uric acid at next visit.  4. Macrocytic anemia: -Hemoglobin is 9.6 with hematocrit of 28.  MCV is 100.7.  No transfusion needed.  Orders placed this encounter:  No orders of the defined types were placed in this encounter.    Derek Jack, MD [place] [phone]   I, Milinda Antis, am acting as a scribe for Dr. Sanda Linger.  I, Derek Jack MD, have reviewed the above documentation for accuracy and completeness, and I agree with the above.

## 2020-02-03 NOTE — Patient Instructions (Signed)
[place] at [facility] Discharge Instructions  You were seen today by Dr. Delton Coombes. He went over your recent results. Continue taking venetoclax daily and stop taking any more allopurinol after finishing your current bottle. Dr. Delton Coombes will see you back in 3 weeks for labs and follow up.   Thank you for choosing [place] at [facility] to provide your oncology and hematology care.  To afford each patient quality time with our provider, please arrive at least 15 minutes before your scheduled appointment time.   If you have a lab appointment with the [facility] please come in thru the Main Entrance and check in at the main information desk  You need to re-schedule your appointment should you arrive 10 or more minutes late.  We strive to give you quality time with our providers, and arriving late affects you and other patients whose appointments are after yours.  Also, if you no show three or more times for appointments you may be dismissed from the clinic at the providers discretion.     Again, thank you for choosing [facility].  Our hope is that these requests will decrease the amount of time that you wait before being seen by our physicians.       _____________________________________________________________  Should you have questions after your visit to [facility], please contact our office at [phone] between the hours of 8:00 a.m. and 4:30 p.m.  Voicemails left after 4:00 p.m. will not be returned until the following business day.  For prescription refill requests, have your pharmacy contact our office and allow 72 hours.    Cancer Center Support Programs:   > Cancer Support Group  2nd Tuesday of the month 1pm-2pm, Journey Room

## 2020-02-09 ENCOUNTER — Other Ambulatory Visit (HOSPITAL_COMMUNITY): Payer: Self-pay | Admitting: Hematology

## 2020-02-18 ENCOUNTER — Other Ambulatory Visit: Payer: Self-pay

## 2020-02-18 DIAGNOSIS — N185 Chronic kidney disease, stage 5: Secondary | ICD-10-CM

## 2020-02-19 DIAGNOSIS — D696 Thrombocytopenia, unspecified: Secondary | ICD-10-CM | POA: Diagnosis not present

## 2020-02-19 DIAGNOSIS — D649 Anemia, unspecified: Secondary | ICD-10-CM | POA: Diagnosis not present

## 2020-02-19 DIAGNOSIS — E782 Mixed hyperlipidemia: Secondary | ICD-10-CM | POA: Diagnosis not present

## 2020-02-19 DIAGNOSIS — I129 Hypertensive chronic kidney disease with stage 1 through stage 4 chronic kidney disease, or unspecified chronic kidney disease: Secondary | ICD-10-CM | POA: Diagnosis not present

## 2020-02-19 DIAGNOSIS — N184 Chronic kidney disease, stage 4 (severe): Secondary | ICD-10-CM | POA: Diagnosis not present

## 2020-02-19 DIAGNOSIS — C9110 Chronic lymphocytic leukemia of B-cell type not having achieved remission: Secondary | ICD-10-CM | POA: Diagnosis not present

## 2020-02-19 DIAGNOSIS — Z0189 Encounter for other specified special examinations: Secondary | ICD-10-CM | POA: Diagnosis not present

## 2020-02-19 DIAGNOSIS — I4891 Unspecified atrial fibrillation: Secondary | ICD-10-CM | POA: Diagnosis not present

## 2020-02-19 DIAGNOSIS — I10 Essential (primary) hypertension: Secondary | ICD-10-CM | POA: Diagnosis not present

## 2020-02-23 DIAGNOSIS — G589 Mononeuropathy, unspecified: Secondary | ICD-10-CM | POA: Diagnosis not present

## 2020-02-23 DIAGNOSIS — R944 Abnormal results of kidney function studies: Secondary | ICD-10-CM | POA: Diagnosis not present

## 2020-02-23 DIAGNOSIS — C9110 Chronic lymphocytic leukemia of B-cell type not having achieved remission: Secondary | ICD-10-CM | POA: Diagnosis not present

## 2020-02-23 DIAGNOSIS — R001 Bradycardia, unspecified: Secondary | ICD-10-CM | POA: Diagnosis not present

## 2020-02-23 DIAGNOSIS — E6609 Other obesity due to excess calories: Secondary | ICD-10-CM | POA: Diagnosis not present

## 2020-02-23 DIAGNOSIS — I429 Cardiomyopathy, unspecified: Secondary | ICD-10-CM | POA: Diagnosis not present

## 2020-02-23 DIAGNOSIS — E782 Mixed hyperlipidemia: Secondary | ICD-10-CM | POA: Diagnosis not present

## 2020-02-23 DIAGNOSIS — R7301 Impaired fasting glucose: Secondary | ICD-10-CM | POA: Diagnosis not present

## 2020-02-23 DIAGNOSIS — I10 Essential (primary) hypertension: Secondary | ICD-10-CM | POA: Diagnosis not present

## 2020-02-23 DIAGNOSIS — D696 Thrombocytopenia, unspecified: Secondary | ICD-10-CM | POA: Diagnosis not present

## 2020-02-26 ENCOUNTER — Inpatient Hospital Stay (HOSPITAL_COMMUNITY): Payer: Medicare HMO

## 2020-02-26 ENCOUNTER — Other Ambulatory Visit: Payer: Self-pay

## 2020-02-26 ENCOUNTER — Inpatient Hospital Stay (HOSPITAL_BASED_OUTPATIENT_CLINIC_OR_DEPARTMENT_OTHER): Payer: Medicare HMO | Admitting: Hematology

## 2020-02-26 ENCOUNTER — Encounter (HOSPITAL_COMMUNITY): Payer: Self-pay | Admitting: Hematology

## 2020-02-26 VITALS — BP 135/83 | HR 69 | Temp 96.7°F | Resp 18 | Wt 221.4 lb

## 2020-02-26 DIAGNOSIS — N184 Chronic kidney disease, stage 4 (severe): Secondary | ICD-10-CM | POA: Diagnosis not present

## 2020-02-26 DIAGNOSIS — N400 Enlarged prostate without lower urinary tract symptoms: Secondary | ICD-10-CM | POA: Diagnosis not present

## 2020-02-26 DIAGNOSIS — I10 Essential (primary) hypertension: Secondary | ICD-10-CM | POA: Diagnosis not present

## 2020-02-26 DIAGNOSIS — D696 Thrombocytopenia, unspecified: Secondary | ICD-10-CM | POA: Diagnosis not present

## 2020-02-26 DIAGNOSIS — D649 Anemia, unspecified: Secondary | ICD-10-CM | POA: Diagnosis not present

## 2020-02-26 DIAGNOSIS — K22711 Barrett's esophagus with high grade dysplasia: Secondary | ICD-10-CM | POA: Diagnosis not present

## 2020-02-26 DIAGNOSIS — R7301 Impaired fasting glucose: Secondary | ICD-10-CM | POA: Diagnosis not present

## 2020-02-26 DIAGNOSIS — C9110 Chronic lymphocytic leukemia of B-cell type not having achieved remission: Secondary | ICD-10-CM | POA: Diagnosis not present

## 2020-02-26 DIAGNOSIS — I4891 Unspecified atrial fibrillation: Secondary | ICD-10-CM | POA: Diagnosis not present

## 2020-02-26 DIAGNOSIS — E782 Mixed hyperlipidemia: Secondary | ICD-10-CM | POA: Diagnosis not present

## 2020-02-26 LAB — COMPREHENSIVE METABOLIC PANEL
ALT: 12 U/L (ref 0–44)
AST: 13 U/L — ABNORMAL LOW (ref 15–41)
Albumin: 3.9 g/dL (ref 3.5–5.0)
Alkaline Phosphatase: 49 U/L (ref 38–126)
Anion gap: 8 (ref 5–15)
BUN: 61 mg/dL — ABNORMAL HIGH (ref 8–23)
CO2: 21 mmol/L — ABNORMAL LOW (ref 22–32)
Calcium: 8.7 mg/dL — ABNORMAL LOW (ref 8.9–10.3)
Chloride: 110 mmol/L (ref 98–111)
Creatinine, Ser: 5.59 mg/dL — ABNORMAL HIGH (ref 0.61–1.24)
GFR calc Af Amer: 10 mL/min — ABNORMAL LOW (ref >60)
GFR calc non Af Amer: 9 mL/min — ABNORMAL LOW (ref >60)
Glucose, Bld: 133 mg/dL — ABNORMAL HIGH (ref 70–99)
Potassium: 4.1 mmol/L (ref 3.5–5.1)
Sodium: 139 mmol/L (ref 135–145)
Total Bilirubin: 0.6 mg/dL (ref 0.3–1.2)
Total Protein: 6.1 g/dL — ABNORMAL LOW (ref 6.5–8.1)

## 2020-02-26 LAB — CBC WITH DIFFERENTIAL/PLATELET
Abs Immature Granulocytes: 0.01 10*3/uL (ref 0.00–0.07)
Basophils Absolute: 0 10*3/uL (ref 0.0–0.1)
Basophils Relative: 1 %
Eosinophils Absolute: 0 10*3/uL (ref 0.0–0.5)
Eosinophils Relative: 2 %
HCT: 30 % — ABNORMAL LOW (ref 39.0–52.0)
Hemoglobin: 10 g/dL — ABNORMAL LOW (ref 13.0–17.0)
Immature Granulocytes: 0 %
Lymphocytes Relative: 18 %
Lymphs Abs: 0.4 10*3/uL — ABNORMAL LOW (ref 0.7–4.0)
MCH: 33.9 pg (ref 26.0–34.0)
MCHC: 33.3 g/dL (ref 30.0–36.0)
MCV: 101.7 fL — ABNORMAL HIGH (ref 80.0–100.0)
Monocytes Absolute: 0.3 10*3/uL (ref 0.1–1.0)
Monocytes Relative: 10 %
Neutro Abs: 1.7 10*3/uL (ref 1.7–7.7)
Neutrophils Relative %: 69 %
Platelets: 75 10*3/uL — ABNORMAL LOW (ref 150–400)
RBC: 2.95 MIL/uL — ABNORMAL LOW (ref 4.22–5.81)
RDW: 14.3 % (ref 11.5–15.5)
WBC: 2.5 10*3/uL — ABNORMAL LOW (ref 4.0–10.5)
nRBC: 0 % (ref 0.0–0.2)

## 2020-02-26 LAB — URIC ACID: Uric Acid, Serum: 5.7 mg/dL (ref 3.7–8.6)

## 2020-02-26 LAB — LACTATE DEHYDROGENASE: LDH: 120 U/L (ref 98–192)

## 2020-02-26 MED ORDER — INFLUENZA VAC A&B SA ADJ QUAD 0.5 ML IM PRSY
0.5000 mL | PREFILLED_SYRINGE | Freq: Once | INTRAMUSCULAR | Status: AC
  Administered 2020-02-26: 0.5 mL via INTRAMUSCULAR
  Filled 2020-02-26: qty 0.5

## 2020-02-26 NOTE — Progress Notes (Signed)
Flu vaccine given per MD orders, See [facility] for administration information. Patient discharged ambulatory and in stable condition from clinic. Follow up as scheduled.

## 2020-02-26 NOTE — Progress Notes (Signed)
[place] [address], [place] [address]   CLINIC:  Medical Oncology/Hematology  PCP:  [name], MD [address] [place] [address]  [phone]  REASON FOR VISIT:  Follow-up for CLL  PRIOR THERAPY:  1. Imbruvica 420 mg from 08/19/2016 through 09/2016. 2. Chlorambucil 30 mg from 10/2017 through 11/07/2018.  CURRENT THERAPY: Venetoclax daily  INTERVAL HISTORY:  [name], a 79 y.o. male, returns for routine follow-up for his CLL. Andrew [name] was last seen on 02/03/2020.  Today he reports feeling well. He is taking the venetoclax daily and is tolerating it well; he denies having any recent infections, F/C, or N/V. He stopped taking allopurinol. He denies having any history of hepatitis. He has paroxysmal a-fib and was treated with coumadin 15 years ago, but is not taking any blood thinners now. His appetite is great.  He is inquiring about getting his [place] booster; he received the second dose in February.   REVIEW OF SYSTEMS:  Review of Systems  Constitutional: Positive for fatigue (75%). Negative for appetite change, chills and fever.  Gastrointestinal: Negative for nausea and vomiting.  All other systems reviewed and are negative.   PAST MEDICAL/SURGICAL HISTORY:  Past Medical History:  Diagnosis Date   CKD (chronic kidney disease), stage IV (HCC)    CLL (chronic lymphocytic leukemia) ([facility])    Hypertension    NICM (nonischemic cardiomyopathy) ([facility])    a. cath 2007, EF 35%, minimal CAD. b. EF normal 2018.   PAF (paroxysmal atrial fibrillation) (HCC)    Pinched nerve in neck    SDH (subdural hematoma) (HCC)    Thrombocytopenia ([facility])    Past Surgical History:  Procedure Laterality Date   BIOPSY  08/05/2018   Procedure: BIOPSY;  Surgeon: Danie Binder, MD;  [facility];  Service: Endoscopy;;  gastric    CATARACT EXTRACTION, BILATERAL     COLONOSCOPY  07/10/2011   Procedure: COLONOSCOPY;   Surgeon: Dorothyann Peng, MD;  [facility];  Service: Endoscopy;  Laterality: N/A;  10:30 AM   CRANIOTOMY N/A 10/14/2016   Procedure: CRANIOTOMY HEMATOMA EVACUATION SUBDURAL;  Surgeon: Jovita Gamma, MD;  Location: [facility];  Service: Neurosurgery;  Laterality: N/A;  CRANIOTOMY HEMATOMA EVACUATION SUBDURAL   ESOPHAGOGASTRODUODENOSCOPY N/A 08/05/2018   Procedure: ESOPHAGOGASTRODUODENOSCOPY (EGD);  Surgeon: Danie Binder, MD;  [facility];  Service: Endoscopy;  Laterality: N/A;  3:00pm   THYROIDECTOMY, PARTIAL     TONSILLECTOMY      SOCIAL HISTORY:  Social History   Socioeconomic History   Marital status: Married    Spouse name: Not on file   Number of children: Not on file   Years of education: Not on file   Highest education level: Not on file  Occupational History   Not on file  Tobacco Use   Smoking status: Former Smoker    Packs/day: 1.00    Years: 15.00    Pack years: 15.00    Quit date: 01/26/1971    Years since quitting: 49.1   Smokeless tobacco: Never Used  Vaping Use   Vaping Use: Never used  Substance and Sexual Activity   Alcohol use: No   Drug use: No   Sexual activity: Not on file  Other Topics Concern   Not on file  Social History Narrative   Not on file   Social Determinants of Health   Financial Resource Strain:    Difficulty  of Paying Living Expenses: Not on file  Food Insecurity:    Worried About [place] in the Last Year: Not on file   Ran Out of Food in the Last Year: Not on file  Transportation Needs:    Lack of Transportation (Medical): Not on file   Lack of Transportation (Non-Medical): Not on file  Physical Activity:    Days of Exercise per Week: Not on file   Minutes of Exercise per Session: Not on file  Stress:    Feeling of Stress : Not on file  Social Connections:    Frequency of Communication with Friends and Family: Not on file   Frequency of Social Gatherings with Friends  and Family: Not on file   Attends Religious Services: Not on file   Active Member of Clubs or Organizations: Not on file   Attends Archivist Meetings: Not on file   Marital Status: Not on file  Intimate Partner Violence:    Fear of Current or Ex-Partner: Not on file   Emotionally Abused: Not on file   Physically Abused: Not on file   Sexually Abused: Not on file    FAMILY HISTORY:  Family History  Problem Relation Age of Onset   CAD Father 37   Cancer Mother    Alzheimer's disease Mother    Colon cancer Neg Hx     CURRENT MEDICATIONS:  Current Outpatient Medications  Medication Sig Dispense Refill   carvedilol (COREG) 6.25 MG tablet Take 1 tablet (6.25 mg total) by mouth 2 (two) times daily. 180 tablet 3   Cyanocobalamin (VITAMIN B12) 1000 MCG TBCR Take 1,000 mcg by mouth daily.      hydrALAZINE (APRESOLINE) 25 MG tablet SMARTSIG:1 Tablet(s) By Mouth Morning-Evening     Multiple Vitamin (MULITIVITAMIN WITH MINERALS) TABS Take 1 tablet by mouth daily.     omeprazole (PRILOSEC) 20 MG capsule 1 PO 30 MINS PRIOR TO BREAKFAST. 90 capsule 3   sodium bicarbonate 325 MG tablet Take 325 mg by mouth 3 (three) times daily.     tamsulosin (FLOMAX) 0.4 MG CAPS capsule Take 0.4 mg by mouth daily.      VENCLEXTA 100 MG TABS TAKE 2 TABLETS (200 MG TOTAL) BY MOUTH DAILY (Patient taking differently: Take 100 mg by mouth daily. ) 60 tablet 0   allopurinol (ZYLOPRIM) 300 MG tablet Take 1 tablet (300 mg total) by mouth daily. 30 tablet 1   prochlorperazine (COMPAZINE) 10 MG tablet Take 1 tablet (10 mg total) by mouth every 6 (six) hours as needed for nausea or vomiting. (Patient not taking: Reported on 12/26/2019) 45 tablet 2   No current facility-administered medications for this visit.    ALLERGIES:  No Known Allergies  PHYSICAL EXAM:  Performance status (ECOG): 1 - Symptomatic but completely ambulatory  Vitals:   02/26/20 1058  BP: 135/83  Pulse: 69    Resp: 18  Temp: (!) 96.7 F (35.9 C)  SpO2: 100%   Wt Readings from Last 3 Encounters:  02/26/20 221 lb 6.4 oz (100.4 kg)  02/03/20 218 lb 3.2 oz (99 kg)  01/15/20 214 lb 11.2 oz (97.4 kg)   Physical Exam Vitals reviewed.  Constitutional:      Appearance: Normal appearance.  Cardiovascular:     Rate and Rhythm: Normal rate and regular rhythm.     Pulses: Normal pulses.     Heart sounds: Normal heart sounds.  Pulmonary:     Effort: Pulmonary effort is normal.  Breath sounds: Normal breath sounds.  Abdominal:     Palpations: Abdomen is soft. There is no hepatomegaly, splenomegaly or mass.     Tenderness: There is no abdominal tenderness.     Hernia: No hernia is present.  Neurological:     General: No focal deficit present.     Mental Status: He is alert and oriented to person, place, and time.  Psychiatric:        Mood and Affect: Mood normal.        Behavior: Behavior normal.     LABORATORY DATA:  I have reviewed the labs as listed.  CBC Latest Ref Rng & Units 02/26/2020 02/03/2020 01/15/2020  WBC 4.0 - 10.5 K/uL 2.5(L) 2.0(L) 3.0(L)  Hemoglobin 13.0 - 17.0 g/dL 10.0(L) 9.6(L) 9.7(L)  Hematocrit 39 - 52 % 30.0(L) 28.4(L) 30.0(L)  Platelets 150 - 400 K/uL 75(L) 68(L) 51(L)   CMP Latest Ref Rng & Units 02/26/2020 02/03/2020 01/15/2020  Glucose 70 - 99 mg/dL 133(H) 117(H) 136(H)  BUN 8 - 23 mg/dL 61(H) 64(H) 73(H)  Creatinine 0.61 - 1.24 mg/dL 5.59(H) 5.78(H) 6.47(H)  Sodium 135 - 145 mmol/L 139 139 139  Potassium 3.5 - 5.1 mmol/L 4.1 3.8 3.9  Chloride 98 - 111 mmol/L 110 111 110  CO2 22 - 32 mmol/L 21(L) 18(L) 18(L)  Calcium 8.9 - 10.3 mg/dL 8.7(L) 8.6(L) 8.8(L)  Total Protein 6.5 - 8.1 g/dL 6.1(L) 6.1(L) 6.3(L)  Total Bilirubin 0.3 - 1.2 mg/dL 0.6 0.9 0.7  Alkaline Phos 38 - 126 U/L 49 38 38  AST 15 - 41 U/L 13(L) 17 14(L)  ALT 0 - 44 U/L _0 Component Value Date/Time   RBC 2.95 (L) 02/26/2020 1019   MCV 101.7 (H) 02/26/2020 1019   MCH 33.9  02/26/2020 1019   MCHC 33.3 02/26/2020 1019   RDW 14.3 02/26/2020 1019   LYMPHSABS 0.4 (L) 02/26/2020 1019   MONOABS 0.3 02/26/2020 1019   EOSABS 0.0 02/26/2020 1019   BASOSABS 0.0 02/26/2020 1019   Lab Results  Component Value Date   LDH 120 02/26/2020   LDH 124 01/15/2020   LDH 108 12/29/2019    DIAGNOSTIC IMAGING:  I have independently reviewed the scans and discussed with the patient. No results found.   ASSESSMENT:  1. CLL, Rai stage IV: -BMBX on 08/10/2016 shows bone marrow involvement by CLL. -He also had bulky diffuse adenopathy with progressive thrombocytopenia. -Imbruvica 420 mg from 08/19/2016 through May 2018, discontinued due to subdural hematoma on 10/14/2016. -Subsequently offered chlorambucil and obinutuzumab. He refused obinutuzumab. Chlorambucil 30 mg every 2 weeks from June 2019 through 11/07/2018 due to severe weakness. -PET scan on 12/01/2019 showed multiple bilateral enlarged cervical lymph nodes, bilateral axillary and mediastinal adenopathy, prevascular lymph nodes, left lower paratracheal lymph node, minimal adenopathy in the retroperitoneum, external iliac lymph nodes. Spleen measures 16 cm. All lymph nodes measure less than 5 cm with SUV less than 5. 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 IGHV was mutated. -T p53 mutation is pending. -Venetoclax 10 mg started on 12/02/2019 (dose reduced secondary to Coreg), dose increased to 50 mg daily on 12/12/2019. -Dose increased to 100 mg daily on 12/19/2019, held on 12/25/2019 due to elevated creatinine. -Venetoclax 100 mg daily started back on 01/15/2020.  2. 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   PLAN:  1. CLL, Rai stage IV: -He is tolerating venetoclax 100  mg daily reasonably well.  He does not have any GI side effects. -Reviewed labs from today.  LFTs are grossly within normal limits.  CBC  shows white count 2.5 with 70% neutrophils.  Platelet count improved to 75 from 68. -I have recommended starting rituximab monthly for 6 cycles at this time.  First cycle will be _0 mg per metered square, increase to 100 mg per metered square from cycle 2 through 6. -We will likely initiate in the next 1 to 2 weeks.  2. CKD: -Creatinine is continuing to improve.  Today it is 5.59.  3. Tumor lysis prophylaxis: -Uric acid today is 5.7.  We have discontinued allopurinol.  4. Macrocytic anemia: -Hemoglobin today is 10 with MCV of 101.  No transfusion needed.  Orders placed this encounter:  No orders of the defined types were placed in this encounter.    Derek Jack, MD [place] [phone]   I, Milinda Antis, am acting as a scribe for Dr. Sanda Linger.  I, Derek Jack MD, have reviewed the above documentation for accuracy and completeness, and I agree with the above.

## 2020-02-26 NOTE — Patient Instructions (Signed)
[place] at [facility] Discharge Instructions  You were seen today by Dr. Delton Coombes. He went over your recent results. You will be started on rituximab, which is given through IV once a month for 6 months. Dr. Delton Coombes will see you back in 1-2 weeks for labs and follow up.   Thank you for choosing [place] at [facility] to provide your oncology and hematology care.  To afford each patient quality time with our provider, please arrive at least 15 minutes before your scheduled appointment time.   If you have a lab appointment with the [facility] please come in thru the Main Entrance and check in at the main information desk  You need to re-schedule your appointment should you arrive 10 or more minutes late.  We strive to give you quality time with our providers, and arriving late affects you and other patients whose appointments are after yours.  Also, if you no show three or more times for appointments you may be dismissed from the clinic at the providers discretion.     Again, thank you for choosing [facility].  Our hope is that these requests will decrease the amount of time that you wait before being seen by our physicians.       _____________________________________________________________  Should you have questions after your visit to [facility], please contact our office at [phone] between the hours of 8:00 a.m. and 4:30 p.m.  Voicemails left after 4:00 p.m. will not be returned until the following business day.  For prescription refill requests, have your pharmacy contact our office and allow 72 hours.    Cancer Center Support Programs:   > Cancer Support Group  2nd Tuesday of the month 1pm-2pm, Journey Room

## 2020-02-27 DIAGNOSIS — Z85828 Personal history of other malignant neoplasm of skin: Secondary | ICD-10-CM | POA: Diagnosis not present

## 2020-02-27 DIAGNOSIS — D0439 Carcinoma in situ of skin of other parts of face: Secondary | ICD-10-CM | POA: Diagnosis not present

## 2020-02-27 DIAGNOSIS — Z08 Encounter for follow-up examination after completed treatment for malignant neoplasm: Secondary | ICD-10-CM | POA: Diagnosis not present

## 2020-02-27 NOTE — Progress Notes (Signed)
ON PATHWAY REGIMEN - Lymphoma and CLL  No Change  Continue With Treatment as Ordered.  Original Decision Date/Time: 11/13/2019 19:13     Ramp-up cycle = 35 days:     Venetoclax      Venetoclax      Venetoclax      Venetoclax      Venetoclax    Cycle 1 through 24 = every 28 days:     Venetoclax      Rituximab-xxxx      Rituximab-xxxx   **Always confirm dose/schedule in your pharmacy ordering system**  Patient Characteristics: Chronic Lymphocytic Leukemia (CLL), Second Line Disease Type: Chronic Lymphocytic Leukemia (CLL) Disease Type: Not Applicable Disease Type: Not Applicable Line of Therapy: Second Line Rai Stage: IV Intent of Therapy: Non-Curative / Palliative Intent, Discussed with Patient

## 2020-03-01 ENCOUNTER — Encounter: Payer: Self-pay | Admitting: Surgery

## 2020-03-01 ENCOUNTER — Ambulatory Visit (INDEPENDENT_AMBULATORY_CARE_PROVIDER_SITE_OTHER)
Admission: RE | Admit: 2020-03-01 | Discharge: 2020-03-01 | Disposition: A | Discharge status: Home / Self Care | Payer: Medicare HMO | Source: Ambulatory Visit | Attending: Surgery | Admitting: Surgery

## 2020-03-01 ENCOUNTER — Ambulatory Visit (HOSPITAL_COMMUNITY)
Admission: RE | Admit: 2020-03-01 | Discharge: 2020-03-01 | Disposition: A | Discharge status: Home / Self Care | Payer: Medicare HMO | Source: Ambulatory Visit | Attending: Surgery | Admitting: Surgery

## 2020-03-01 ENCOUNTER — Other Ambulatory Visit: Payer: Self-pay

## 2020-03-01 ENCOUNTER — Ambulatory Visit (INDEPENDENT_AMBULATORY_CARE_PROVIDER_SITE_OTHER): Payer: Medicare HMO | Admitting: Surgery

## 2020-03-01 VITALS — BP 154/93 | HR 72 | Temp 97.8°F | Resp 18 | Ht 74.0 in | Wt 217.0 lb

## 2020-03-01 DIAGNOSIS — N185 Chronic kidney disease, stage 5: Secondary | ICD-10-CM

## 2020-03-01 NOTE — Progress Notes (Signed)
[facility] [place]  Patient name: Andrew [name] MRN: [id number] DOB: [birth year] Sex: male   REQUESTING PROVIDER:    Dr. Marval Regal   REASON FOR CONSULT:    Renal disease, dialysis  HISTORY OF PRESENT ILLNESS:   Andrew [name] is a 79 y.o. male, who is referred for dialysis access.  His renal failure secondary to hypertension and CLL.  He is right-handed.  Patient has a history of atrial fibrillation.  He has undergone ablation and is not on anticoagulation.  He is medically managed for hypercholesterolemia and hypertension.  He is a former smoker.  PAST MEDICAL HISTORY    Past Medical History:  Diagnosis Date  . CKD (chronic kidney disease), stage IV ([facility])   . CLL (chronic lymphocytic leukemia) ([facility])   . Hypertension   . NICM (nonischemic cardiomyopathy) ([facility])    a. cath 2007, EF 35%, minimal CAD. b. EF normal 2018.  Marland Kitchen PAF (paroxysmal atrial fibrillation) ([facility])   . Pinched nerve in neck   . SDH (subdural hematoma) ([facility])   . Thrombocytopenia ([facility])      FAMILY HISTORY   Family History  Problem Relation Age of Onset  . CAD Father 47  . Cancer Mother   . Alzheimer's disease Mother   . Colon cancer Neg Hx     SOCIAL HISTORY:   Social History   Socioeconomic History  . Marital status: Married    Spouse name: Not on file  . Number of children: Not on file  . Years of education: Not on file  . Highest education level: Not on file  Occupational History  . Not on file  Tobacco Use  . Smoking status: Former Smoker    Packs/day: 1.00    Years: 15.00    Pack years: 15.00    Quit date: 01/26/1971    Years since quitting: 49.1  . Smokeless tobacco: Never Used  Vaping Use  . Vaping Use: Never used  Substance and Sexual Activity  . Alcohol use: No  . Drug use: No  . Sexual activity: Not on file  Other Topics Concern  . Not on file  Social History Narrative  . Not on file   Social Determinants of  Health   Financial Resource Strain:   . Difficulty of Paying Living Expenses: Not on file  Food Insecurity:   . Worried About [place] fund[place] in the Last Year: Not on file  . Ran Out of Food in the Last Year: Not on file  Transportation Needs:   . Lack of Transportation (Medical): Not on file  . Lack of Transportation (Non-Medical): Not on file  Physical Activity:   . Days of Exercise per Week: Not on file  . Minutes of Exercise per Session: Not on file  Stress:   . Feeling of Stress : Not on file  Social Connections:   . Frequency of Communication with Friends and Family: Not on file  . Frequency of Social Gatherings with Friends and Family: Not on file  . Attends Religious Services: Not on file  . Active Member of Clubs or Organizations: Not on file  . Attends Archivist Meetings: Not on file  . Marital Status: Not on file  Intimate Partner Violence:   . Fear of Current or Ex-Partner: Not on file  . Emotionally Abused: Not on file  . Physically Abused: Not on file  . Sexually Abused: Not on file    ALLERGIES:    No  Known Allergies  CURRENT MEDICATIONS:    Current Outpatient Medications  Medication Sig Dispense Refill  . carvedilol (COREG) 6.25 MG tablet Take 1 tablet (6.25 mg total) by mouth 2 (two) times daily. 180 tablet 3  . Cyanocobalamin (VITAMIN B12) 1000 MCG TBCR Take 1,000 mcg by mouth daily.     . Multiple Vitamin (MULITIVITAMIN WITH MINERALS) TABS Take 1 tablet by mouth daily.    Marland Kitchen omeprazole (PRILOSEC) 20 MG capsule 1 PO 30 MINS PRIOR TO BREAKFAST. 90 capsule 3  . sodium bicarbonate 325 MG tablet Take 325 mg by mouth 3 (three) times daily.    . tamsulosin (FLOMAX) 0.4 MG CAPS capsule Take 0.4 mg by mouth daily.     . VENCLEXTA 100 MG TABS TAKE 2 TABLETS (200 MG TOTAL) BY MOUTH DAILY (Patient taking differently: Take 100 mg by mouth daily. ) 60 tablet 0  . allopurinol (ZYLOPRIM) 300 MG tablet Take 1 tablet (300 mg total) by mouth daily. 30  tablet 1  . hydrALAZINE (APRESOLINE) 25 MG tablet SMARTSIG:1 Tablet(s) By Mouth Morning-Evening (Patient not taking: Reported on 03/01/2020)    . prochlorperazine (COMPAZINE) 10 MG tablet Take 1 tablet (10 mg total) by mouth every 6 (six) hours as needed for nausea or vomiting. (Patient not taking: Reported on 12/26/2019) 45 tablet 2   No current facility-administered medications for this visit.    REVIEW OF SYSTEMS:   [X]  denotes positive finding, [ ]  denotes negative finding Cardiac  Comments:  Chest pain or chest pressure:    Shortness of breath upon exertion:    Short of breath when lying flat:    Irregular heart rhythm:        Vascular    Pain in calf, thigh, or hip brought on by ambulation:    Pain in feet at night that wakes you up from your sleep:     Blood clot in your veins:    Leg swelling:         Pulmonary    Oxygen at home:    Productive cough:     Wheezing:         Neurologic    Sudden weakness in arms or legs:     Sudden numbness in arms or legs:     Sudden onset of difficulty speaking or slurred speech:    Temporary loss of vision in one eye:     Problems with dizziness:         Gastrointestinal    Blood in stool:      Vomited blood:         Genitourinary    Burning when urinating:     Blood in urine:        Psychiatric    Major depression:         Hematologic    Bleeding problems:    Problems with blood clotting too easily:        Skin    Rashes or ulcers:        Constitutional    Fever or chills:     PHYSICAL EXAM:   Vitals:   03/01/20 0853  BP: (!) 154/93  Pulse: 72  Resp: 18  Temp: 97.8 F (36.6 C)  TempSrc: Temporal  SpO2: 97%  Weight: 217 lb (98.4 kg)  Height: 6\' 2"  (1.88 m)    GENERAL: The patient is a well-nourished male, in no acute distress. The vital signs are documented above. CARDIAC: There is a regular rate and rhythm.  VASCULAR:  Palpable radial and brachial pulse on the left PULMONARY: Nonlabored  respirations ABDOMEN: Soft and non-tender with normal pitched bowel sounds.  MUSCULOSKELETAL: There are no major deformities or cyanosis. NEUROLOGIC: No focal weakness or paresthesias are detected. SKIN: There are no ulcers or rashes noted. PSYCHIATRIC: The patient has a normal affect.  STUDIES:   I have reviewed the following studies: +-----------------+-------------+----------+---------+  Right Cephalic  Diameter (cm)Depth (cm)Findings   +-----------------+-------------+----------+---------+  Shoulder       0.63               +-----------------+-------------+----------+---------+  Prox upper arm    0.52        branching  +-----------------+-------------+----------+---------+  Mid upper arm    0.56               +-----------------+-------------+----------+---------+  Dist upper arm    0.64        branching  +-----------------+-------------+----------+---------+  Antecubital fossa 0.58 / 0.67             +-----------------+-------------+----------+---------+  Prox forearm    0.53 / 0.41             +-----------------+-------------+----------+---------+  Mid forearm     0.18        branching  +-----------------+-------------+----------+---------+  Dist forearm     0.16               +-----------------+-------------+----------+---------+   +-----------------+-------------+----------+---------+  Right Basilic  Diameter (cm)Depth (cm)Findings   +-----------------+-------------+----------+---------+  Mid upper arm    0.41               +-----------------+-------------+----------+---------+  Dist upper arm    0.38               +-----------------+-------------+----------+---------+  Antecubital fossa  0.45        branching  +-----------------+-------------+----------+---------+   Prox forearm    0.32 / 0.28             +-----------------+-------------+----------+---------+   +-----------------+------------------+----------+---------+  Left Cephalic   Diameter (cm)  Depth (cm)Findings   +-----------------+------------------+----------+---------+  Shoulder         0.46                +-----------------+------------------+----------+---------+  Prox upper arm      0.43                +-----------------+------------------+----------+---------+  Mid upper arm      0.33                +-----------------+------------------+----------+---------+  Dist upper arm  0.48 / 0.57 / 0.39      valves   +-----------------+------------------+----------+---------+  Antecubital fossa  0.43 / 0.39       branching  +-----------------+------------------+----------+---------+  Prox forearm     0.56 / 0.40        joins   +-----------------+------------------+----------+---------+  Mid forearm       0.37                +-----------------+------------------+----------+---------+  Dist forearm       0.38                +-----------------+------------------+----------+---------+   +-----------------+------------------+----------+---------+  Left Basilic    Diameter (cm)  Depth (cm)Findings   +-----------------+------------------+----------+---------+  Prox upper arm      0.65                +-----------------+------------------+----------+---------+  Mid upper arm      0.42                +-----------------+------------------+----------+---------+  Dist upper arm  0.48 / 0.80 / 0.44     branching  +-----------------+------------------+----------+---------+  Antecubital fossa    0.35                 +-----------------+------------------+----------+---------+  Prox forearm       0.34          joins   +-----------------+------------------+----------+---------+   ASSESSMENT and PLAN   Stage V chronic renal insufficiency: I discussed proceeding with a left brachiocephalic fistula.  I discussed the details of the operation as well as the risks and benefits including the risk of steal syndrome, nonmaturity, and the need for future interventions.  All of his questions were answered.  He is scheduled to see Dr. Marval Regal in 3 weeks.  He would like to further discuss this with him before scheduling his operation.   Leia Alf, MD, FACS [facility] [phone] Pager [phone]

## 2020-03-10 MED FILL — VENCLEXTA 100 MG TABS: 100 | 30 days supply | Qty: 60 | Fill #0

## 2020-03-11 ENCOUNTER — Inpatient Hospital Stay (HOSPITAL_COMMUNITY): Payer: Medicare HMO

## 2020-03-11 ENCOUNTER — Inpatient Hospital Stay (HOSPITAL_COMMUNITY): Payer: Medicare HMO | Attending: Hematology | Admitting: Hematology

## 2020-03-11 ENCOUNTER — Other Ambulatory Visit: Payer: Self-pay

## 2020-03-11 VITALS — BP 137/79 | HR 69 | Temp 96.9°F | Resp 18 | Wt 218.8 lb

## 2020-03-11 VITALS — BP 163/92 | HR 74 | Temp 96.4°F | Resp 18

## 2020-03-11 DIAGNOSIS — Z87891 Personal history of nicotine dependence: Secondary | ICD-10-CM | POA: Diagnosis not present

## 2020-03-11 DIAGNOSIS — C9110 Chronic lymphocytic leukemia of B-cell type not having achieved remission: Secondary | ICD-10-CM

## 2020-03-11 DIAGNOSIS — Z809 Family history of malignant neoplasm, unspecified: Secondary | ICD-10-CM | POA: Insufficient documentation

## 2020-03-11 DIAGNOSIS — Z8249 Family history of ischemic heart disease and other diseases of the circulatory system: Secondary | ICD-10-CM | POA: Diagnosis not present

## 2020-03-11 DIAGNOSIS — Z5112 Encounter for antineoplastic immunotherapy: Secondary | ICD-10-CM | POA: Diagnosis present

## 2020-03-11 DIAGNOSIS — I2510 Atherosclerotic heart disease of native coronary artery without angina pectoris: Secondary | ICD-10-CM | POA: Insufficient documentation

## 2020-03-11 DIAGNOSIS — N184 Chronic kidney disease, stage 4 (severe): Secondary | ICD-10-CM | POA: Insufficient documentation

## 2020-03-11 DIAGNOSIS — D696 Thrombocytopenia, unspecified: Secondary | ICD-10-CM | POA: Diagnosis not present

## 2020-03-11 DIAGNOSIS — Z79899 Other long term (current) drug therapy: Secondary | ICD-10-CM | POA: Diagnosis not present

## 2020-03-11 DIAGNOSIS — I480 Paroxysmal atrial fibrillation: Secondary | ICD-10-CM | POA: Diagnosis not present

## 2020-03-11 DIAGNOSIS — I10 Essential (primary) hypertension: Secondary | ICD-10-CM | POA: Diagnosis not present

## 2020-03-11 DIAGNOSIS — N400 Enlarged prostate without lower urinary tract symptoms: Secondary | ICD-10-CM | POA: Insufficient documentation

## 2020-03-11 LAB — CBC WITH DIFFERENTIAL/PLATELET
Abs Immature Granulocytes: 0.01 10*3/uL (ref 0.00–0.07)
Basophils Absolute: 0 10*3/uL (ref 0.0–0.1)
Basophils Relative: 0 %
Eosinophils Absolute: 0.1 10*3/uL (ref 0.0–0.5)
Eosinophils Relative: 2 %
HCT: 29.8 % — ABNORMAL LOW (ref 39.0–52.0)
Hemoglobin: 10.2 g/dL — ABNORMAL LOW (ref 13.0–17.0)
Immature Granulocytes: 0 %
Lymphocytes Relative: 14 %
Lymphs Abs: 0.4 10*3/uL — ABNORMAL LOW (ref 0.7–4.0)
MCH: 33.8 pg (ref 26.0–34.0)
MCHC: 34.2 g/dL (ref 30.0–36.0)
MCV: 98.7 fL (ref 80.0–100.0)
Monocytes Absolute: 0.3 10*3/uL (ref 0.1–1.0)
Monocytes Relative: 11 %
Neutro Abs: 2.1 10*3/uL (ref 1.7–7.7)
Neutrophils Relative %: 73 %
Platelets: 66 10*3/uL — ABNORMAL LOW (ref 150–400)
RBC: 3.02 MIL/uL — ABNORMAL LOW (ref 4.22–5.81)
RDW: 13.5 % (ref 11.5–15.5)
WBC: 2.9 10*3/uL — ABNORMAL LOW (ref 4.0–10.5)
nRBC: 0 % (ref 0.0–0.2)

## 2020-03-11 LAB — COMPREHENSIVE METABOLIC PANEL
ALT: 15 U/L (ref 0–44)
AST: 14 U/L — ABNORMAL LOW (ref 15–41)
Albumin: 3.8 g/dL (ref 3.5–5.0)
Alkaline Phosphatase: 49 U/L (ref 38–126)
Anion gap: 9 (ref 5–15)
BUN: 65 mg/dL — ABNORMAL HIGH (ref 8–23)
CO2: 20 mmol/L — ABNORMAL LOW (ref 22–32)
Calcium: 8.8 mg/dL — ABNORMAL LOW (ref 8.9–10.3)
Chloride: 110 mmol/L (ref 98–111)
Creatinine, Ser: 5.93 mg/dL — ABNORMAL HIGH (ref 0.61–1.24)
GFR, Estimated: 8 mL/min — ABNORMAL LOW (ref >60)
Glucose, Bld: 131 mg/dL — ABNORMAL HIGH (ref 70–99)
Potassium: 3.7 mmol/L (ref 3.5–5.1)
Sodium: 139 mmol/L (ref 135–145)
Total Bilirubin: 1 mg/dL (ref 0.3–1.2)
Total Protein: 6.2 g/dL — ABNORMAL LOW (ref 6.5–8.1)

## 2020-03-11 LAB — LACTATE DEHYDROGENASE: LDH: 115 U/L (ref 98–192)

## 2020-03-11 LAB — URIC ACID: Uric Acid, Serum: 5.7 mg/dL (ref 3.7–8.6)

## 2020-03-11 MED ORDER — SODIUM CHLORIDE 0.9 % IV SOLN: Freq: Once | INTRAVENOUS | Status: AC

## 2020-03-11 MED ORDER — ACETAMINOPHEN 325 MG PO TABS
ORAL_TABLET | ORAL | Status: AC
  Filled 2020-03-11: qty 2

## 2020-03-11 MED ORDER — FAMOTIDINE IN NACL 20-0.9 MG/50ML-% IV SOLN
INTRAVENOUS | Status: AC
  Filled 2020-03-11: qty 50

## 2020-03-11 MED ORDER — ACETAMINOPHEN 325 MG PO TABS
650.0000 mg | ORAL_TABLET | Freq: Once | ORAL | Status: AC
  Administered 2020-03-11: 650 mg via ORAL

## 2020-03-11 MED ORDER — FAMOTIDINE IN NACL 20-0.9 MG/50ML-% IV SOLN
20.0000 mg | Freq: Once | INTRAVENOUS | Status: AC
  Administered 2020-03-11: 20 mg via INTRAVENOUS

## 2020-03-11 MED ORDER — DIPHENHYDRAMINE HCL 50 MG/ML IJ SOLN
50.0000 mg | Freq: Once | INTRAMUSCULAR | Status: AC
  Administered 2020-03-11: 50 mg via INTRAVENOUS

## 2020-03-11 MED ORDER — SODIUM CHLORIDE 0.9 % IV SOLN
400.0000 mg | Freq: Once | INTRAVENOUS | Status: AC
  Administered 2020-03-11: 400 mg via INTRAVENOUS
  Filled 2020-03-11: qty 40

## 2020-03-11 MED ORDER — DIPHENHYDRAMINE HCL 50 MG/ML IJ SOLN
INTRAMUSCULAR | Status: AC
  Filled 2020-03-11: qty 1

## 2020-03-11 NOTE — Progress Notes (Signed)
.   Pharmacist Chemotherapy Monitoring - Initial Assessment    Anticipated start date:03/11/20   Regimen:  . Are orders appropriate based on the patient's diagnosis, regimen, and cycle? Yes . Does the plan date match the patient's scheduled date? Yes . Is the sequencing of drugs appropriate? Yes . Are the premedications appropriate for the patient's regimen? Yes . Prior Authorization for treatment is: Pending o If applicable, is the correct biosimilar selected based on the patient's insurance? yes  Organ Function and Labs: Marland Kitchen Are dose adjustments needed based on the patient's renal function, hepatic function, or hematologic function? No . Are appropriate labs ordered prior to the start of patient's treatment? Yes . Other organ system assessment, if indicated: N/A The following baseline labs, if indicated, have been ordered: ofatumumab: baseline Hepatitis B labs  Done in July 2021 Dose Assessment: . Are the drug doses appropriate? Yes . Are the following correct: o Drug concentrations Yes o IV fluid compatible with drug Yes o Administration routes Yes o Timing of therapy Yes . If applicable, does the patient have documented access for treatment and/or plans for port-a-cath placement? yes . If applicable, have lifetime cumulative doses been properly documented and assessed? not applicable Lifetime Dose Tracking  No doses have been documented on this patient for the following tracked chemicals: Doxorubicin, Epirubicin, Idarubicin, Daunorubicin, Mitoxantrone, Bleomycin, Oxaliplatin, Carboplatin, Liposomal Doxorubicin  o   Toxicity Monitoring/Prevention: . The patient has the following take home antiemetics prescribed: N/A . The patient has the following take home medications prescribed: N/A . Medication allergies and previous infusion related reactions, if applicable, have been reviewed and addressed. Yes . The patient's current medication list has been assessed for drug-drug interactions  with their chemotherapy regimen. no significant drug-drug interactions were identified on review.  Order Review: . Are the treatment plan orders signed? No . Is the patient scheduled to see a provider prior to their treatment? Yes  I verify that I have reviewed each item in the above checklist and answered each question accordingly.  Wynona Neat 03/11/2020 10:19 AM

## 2020-03-11 NOTE — Patient Instructions (Signed)
[facility] Discharge Instructions for Patients Receiving Chemotherapy   Beginning January 23rd 2017 lab work for the [facility] will be done in the  Main lab at [facility] on 1st floor. If you have a lab appointment with the [facility] please come in thru the  Main Entrance and check in at the main information desk   Today you received the following chemotherapy agents Rituximab  To help prevent nausea and vomiting after your treatment, we encourage you to take your nausea medication   If you develop nausea and vomiting, or diarrhea that is not controlled by your medication, call the clinic.  The clinic phone number is [phone]. Office hours are Monday-Friday 8:30am-5:00pm.  BELOW ARE SYMPTOMS THAT SHOULD BE REPORTED IMMEDIATELY:  *FEVER GREATER THAN 101.0 F  *CHILLS WITH OR WITHOUT FEVER  NAUSEA AND VOMITING THAT IS NOT CONTROLLED WITH YOUR NAUSEA MEDICATION  *UNUSUAL SHORTNESS OF BREATH  *UNUSUAL BRUISING OR BLEEDING  TENDERNESS IN MOUTH AND THROAT WITH OR WITHOUT PRESENCE OF ULCERS  *URINARY PROBLEMS  *BOWEL PROBLEMS  UNUSUAL RASH Items with * indicate a potential emergency and should be followed up as soon as possible. If you have an emergency after office hours please contact your primary care physician or go to the nearest emergency department.  Please call the clinic during office hours if you have any questions or concerns.   You may also contact the Patient Navigator at [phone] should you have any questions or need assistance in obtaining follow up care.      Resources For Cancer Patients and their Caregivers ? American Cancer Society: Can assist with transportation, wigs, general needs, runs Look Good Feel Better.        979 437 0858 ? Cancer Care: Provides financial assistance, online support groups, medication/co-pay assistance.  1-800-813-HOPE 717-052-7224) ? [facility] Assists [place]  cancer patients and their families through emotional , educational and financial support.  801 426 3921 ? [place] DSS Where to apply for food stamps, Medicaid and utility assistance. 228-418-4621 ? [facility]: Transportation to medical appointments. (262) 040-0413 ? Social Security Administration: May apply for disability if have a Stage IV cancer. 848 790 2715 313-476-7691 ? [employer], Disability and Transit Services: Assists with nutrition, care and transit needs. [phone]

## 2020-03-11 NOTE — Progress Notes (Signed)
[facility] [address], [place] [address]   CLINIC:  Medical Oncology/Hematology  PCP:  [name], MD [address] [place] [address] [phone]   REASON FOR VISIT:  Follow-up for CLL  PRIOR THERAPY:  1. Imbruvica 420 mg from 08/19/2016 through 09/2016. 2. Chlorambucil 30 mg from 10/2017 through 11/07/2018.  NGS Results: Not done  CURRENT THERAPY: Venetoclax daily and rituximab monthly  BRIEF ONCOLOGIC HISTORY:  Oncology History  CLL (chronic lymphocytic leukemia) ([place])  03/09/2015 Initial Diagnosis   CLL (chronic lymphocytic leukemia) ([place])   08/03/2016 Imaging   CT neck- 1. Diffuse and bulky lymphadenopathy in keeping with history of CLL. Nodal enlargement has diffusely progressed since April 2016. 2. Chest and abdomen CT reported separately.   08/03/2016 Imaging   CT CAP- 1. Bulky lymphadenopathy in the chest, abdomen, and pelvis as described. 2. Splenomegaly. 3. Coronary artery and thoracoabdominal aortic atherosclerosis. 4. Prostatomegaly. 5. Several tiny lucent lesions in the bony anatomic pelvis, nonspecific, but attention on follow-up imaging recommended.   08/10/2016 Procedure   Bone marrow aspiration and biopsy by IR.   08/11/2016 Pathology Results   Bone Marrow, Aspirate,Biopsy, and Clot, left iliac crest - MARKED INVOLVEMENT BY CHRONIC LYMPHOCYTIC LEUKEMIA/SMALL LYMPHOCYTIC LYMPHOMA. PERIPHERAL BLOOD: - CHRONIC LYMPHOCYTIC LEUKEMIA. - THROMBOCYTOPENIA.   08/11/2016 Pathology Results   Bone Marrow Flow Cytometry - CHRONIC LYMPHOCYTIC LEUKEMIA/SMALL LYMPHOCYTIC LYMPHOMA.   08/16/2016 Pathology Results   Normal cytogenetics.  Molecular cytogenetic analysis by [place] demonstrates a 13q-   08/19/2016 -  Chemotherapy   Imbruvica 420 mg daily    03/11/2020 -  Chemotherapy   The patient had riTUXimab-pvvr (RUXIENCE) 900 mg in sodium chloride 0.9 % 250 mL (2.6471 mg/mL) infusion, 375 mg/m2 = 900 mg, Intravenous,  Once,  0 of 6 cycles Dose modification: 500 mg/m2 (Cycle 2, Reason: Provider Judgment)  for chemotherapy treatment.      CANCER STAGING: Cancer Staging CLL (chronic lymphocytic leukemia) ([place]) Staging form: Chronic Lymphocytic Leukemia / Small Lymphocytic Lymphoma, AJCC 8th Edition - Clinical stage from 09/04/2016: Modified Rai Stage IV ([place]: Stage C, Modified Rai risk: High, Lymphocytosis: Present, Adenopathy: Present, Organomegaly: Present, Anemia: Absent, Thrombocytopenia: Present) - Signed by Baird Cancer, PA-C on 09/04/2016   INTERVAL HISTORY:  [name], a 79 y.o. male, returns for routine follow-up and consideration for first cycle of immunotherapy. Requan was last seen on 02/26/2020.  Due for initiating cycle #1 of rituximab today.   Overall, he tells me he has been feeling pretty well. He is tolerating the venetoclax well and denies having any issues. His breathing is good. He denies having any recent infections, F/C, or night sweats.  Overall, he feels ready for first cycle of immunotherapy today.    REVIEW OF SYSTEMS:  Review of Systems  Constitutional: Positive for fatigue (75%). Negative for appetite change, chills, diaphoresis and fever.  Respiratory: Negative for shortness of breath.   All other systems reviewed and are negative.   PAST MEDICAL/SURGICAL HISTORY:  Past Medical History:  Diagnosis Date  . CKD (chronic kidney disease), stage IV ([place])   . CLL (chronic lymphocytic leukemia) ([place])   . Hypertension   . NICM (nonischemic cardiomyopathy) ([place])    a. cath 2007, EF 35%, minimal CAD. b. EF normal 2018.  Marland Kitchen PAF (paroxysmal atrial fibrillation) ([place])   . Pinched nerve in neck   . SDH (subdural hematoma) ([place])   . Thrombocytopenia ([place])    Past Surgical History:  Procedure Laterality Date  . BIOPSY  08/05/2018   Procedure: BIOPSY;  Surgeon: Danie Binder, MD;  [facility];  Service: Endoscopy;;  gastric   . CATARACT EXTRACTION, BILATERAL     . COLONOSCOPY  07/10/2011   Procedure: COLONOSCOPY;  Surgeon: Dorothyann Peng, MD;  [facility];  Service: Endoscopy;  Laterality: N/A;  10:30 AM  . CRANIOTOMY N/A 10/14/2016   Procedure: CRANIOTOMY HEMATOMA EVACUATION SUBDURAL;  Surgeon: Jovita Gamma, MD;  Location: [facility];  Service: Neurosurgery;  Laterality: N/A;  CRANIOTOMY HEMATOMA EVACUATION SUBDURAL  . ESOPHAGOGASTRODUODENOSCOPY N/A 08/05/2018   Procedure: ESOPHAGOGASTRODUODENOSCOPY (EGD);  Surgeon: Danie Binder, MD;  [facility];  Service: Endoscopy;  Laterality: N/A;  3:00pm  . THYROIDECTOMY, PARTIAL    . TONSILLECTOMY      SOCIAL HISTORY:  Social History   Socioeconomic History  . Marital status: Married    Spouse name: Not on file  . Number of children: Not on file  . Years of education: Not on file  . Highest education level: Not on file  Occupational History  . Not on file  Tobacco Use  . Smoking status: Former Smoker    Packs/day: 1.00    Years: 15.00    Pack years: 15.00    Quit date: 01/26/1971    Years since quitting: 49.1  . Smokeless tobacco: Never Used  Vaping Use  . Vaping Use: Never used  Substance and Sexual Activity  . Alcohol use: No  . Drug use: No  . Sexual activity: Not on file  Other Topics Concern  . Not on file  Social History Narrative  . Not on file   Social Determinants of Health   Financial Resource Strain:   . Difficulty of Paying Living Expenses: Not on file  Food Insecurity:   . Worried About Charity fundraiser in the Last Year: Not on file  . Ran Out of Food in the Last Year: Not on file  Transportation Needs:   . Lack of Transportation (Medical): Not on file  . Lack of Transportation (Non-Medical): Not on file  Physical Activity:   . Days of Exercise per Week: Not on file  . Minutes of Exercise per Session: Not on file  Stress:   . Feeling of Stress : Not on file  Social Connections:   . Frequency of Communication with Friends and Family: Not  on file  . Frequency of Social Gatherings with Friends and Family: Not on file  . Attends Religious Services: Not on file  . Active Member of Clubs or Organizations: Not on file  . Attends Archivist Meetings: Not on file  . Marital Status: Not on file  Intimate Partner Violence:   . Fear of Current or Ex-Partner: Not on file  . Emotionally Abused: Not on file  . Physically Abused: Not on file  . Sexually Abused: Not on file    FAMILY HISTORY:  Family History  Problem Relation Age of Onset  . CAD Father 57  . Cancer Mother   . Alzheimer's disease Mother   . Colon cancer Neg Hx     CURRENT MEDICATIONS:  Current Outpatient Medications  Medication Sig Dispense Refill  . carvedilol (COREG) 6.25 MG tablet Take 1 tablet (6.25 mg total) by mouth 2 (two) times daily. 180 tablet 3  . Cyanocobalamin (VITAMIN B12) 1000 MCG TBCR Take 1,000 mcg by mouth daily.     . hydrALAZINE (APRESOLINE) 25 MG tablet SMARTSIG:1  Tablet(s) By Mouth Morning-Evening    . Multiple Vitamin (MULITIVITAMIN WITH MINERALS) TABS Take 1 tablet by mouth daily.    Marland Kitchen omeprazole (PRILOSEC) 20 MG capsule 1 PO 30 MINS PRIOR TO BREAKFAST. 90 capsule 3  . sodium bicarbonate 325 MG tablet Take 325 mg by mouth 3 (three) times daily.    . tamsulosin (FLOMAX) 0.4 MG CAPS capsule Take 0.4 mg by mouth daily.     . VENCLEXTA 100 MG TABS TAKE 2 TABLETS (200 MG TOTAL) BY MOUTH DAILY (Patient taking differently: Take 100 mg by mouth daily. Patient is only taking one tablet daily) 60 tablet 0   No current facility-administered medications for this visit.    ALLERGIES:  No Known Allergies  PHYSICAL EXAM:  Performance status (ECOG): 1 - Symptomatic but completely ambulatory  Vitals:   03/11/20 0906  BP: 137/79  Pulse: 69  Resp: 18  Temp: (!) 96.9 F (36.1 C)  SpO2: 99%   Wt Readings from Last 3 Encounters:  03/11/20 218 lb 12.8 oz (99.2 kg)  03/01/20 217 lb (98.4 kg)  02/26/20 221 lb 6.4 oz (100.4 kg)    Physical Exam Vitals reviewed.  Constitutional:      Appearance: Normal appearance.  Cardiovascular:     Rate and Rhythm: Normal rate and regular rhythm.     Pulses: Normal pulses.     Heart sounds: Normal heart sounds.  Pulmonary:     Effort: Pulmonary effort is normal.     Breath sounds: Normal breath sounds.  Abdominal:     Palpations: Abdomen is soft. There is no hepatomegaly, splenomegaly or mass.     Tenderness: There is no abdominal tenderness.     Hernia: No hernia is present.  Lymphadenopathy:     Upper Body:     Right upper body: No supraclavicular, axillary or pectoral adenopathy.     Left upper body: No supraclavicular, axillary or pectoral adenopathy.  Neurological:     General: No focal deficit present.     Mental Status: He is alert and oriented to person, place, and time.  Psychiatric:        Mood and Affect: Mood normal.        Behavior: Behavior normal.     LABORATORY DATA:  I have reviewed the labs as listed.  CBC Latest Ref Rng & Units 03/11/2020 02/26/2020 02/03/2020  WBC 4.0 - 10.5 K/uL 2.9(L) 2.5(L) 2.0(L)  Hemoglobin 13.0 - 17.0 g/dL 10.2(L) 10.0(L) 9.6(L)  Hematocrit 39 - 52 % 29.8(L) 30.0(L) 28.4(L)  Platelets 150 - 400 K/uL 66(L) 75(L) 68(L)   CMP Latest Ref Rng & Units 03/11/2020 02/26/2020 02/03/2020  Glucose 70 - 99 mg/dL 131(H) 133(H) 117(H)  BUN 8 - 23 mg/dL 65(H) 61(H) 64(H)  Creatinine 0.61 - 1.24 mg/dL 5.93(H) 5.59(H) 5.78(H)  Sodium 135 - 145 mmol/L 139 139 139  Potassium 3.5 - 5.1 mmol/L 3.7 4.1 3.8  Chloride 98 - 111 mmol/L 110 110 111  CO2 22 - 32 mmol/L 20(L) 21(L) 18(L)  Calcium 8.9 - 10.3 mg/dL 8.8(L) 8.7(L) 8.6(L)  Total Protein 6.5 - 8.1 g/dL 6.2(L) 6.1(L) 6.1(L)  Total Bilirubin 0.3 - 1.2 mg/dL 1.0 0.6 0.9  Alkaline Phos 38 - 126 U/L 49 49 38  AST 15 - 41 U/L 14(L) 13(L) 17  ALT 0 - 44 U/L _0 Lab Results  Component Value Date   LDH 115 03/11/2020   LDH 120 02/26/2020   LDH 124 01/15/2020    DIAGNOSTIC  IMAGING:  I have independently reviewed the scans and discussed with the patient. VAS [place] UPPER EXTREMITY ARTERIAL DUPLEX  Result Date: 03/01/2020 UPPER EXTREMITY DUPLEX STUDY Indications: Patient complains of Pre access.  Performing Technologist: Alvia Grove RVT  Examination Guidelines: A complete evaluation includes B-mode imaging, spectral Doppler, color Doppler, and power Doppler as needed of all accessible portions of each vessel. Bilateral testing is considered an integral part of a complete examination. Limited examinations for reoccurring indications may be performed as noted.  Right Pre-Dialysis Findings: +-----------------------+----------+--------------------+---------+--------+ Location               PSV (cm/s)Intralum. Diam. (cm)Waveform Comments +-----------------------+----------+--------------------+---------+--------+ Brachial Antecub. fossa55        0.62                triphasic         +-----------------------+----------+--------------------+---------+--------+ Radial Art at Wrist    63        0.30                triphasic         +-----------------------+----------+--------------------+---------+--------+ Ulnar Art at Wrist     59        0.30                triphasic         +-----------------------+----------+--------------------+---------+--------+ Left Pre-Dialysis Findings: +-----------------------+----------+--------------------+---------+--------+ Location               PSV (cm/s)Intralum. Diam. (cm)Waveform Comments +-----------------------+----------+--------------------+---------+--------+ Brachial Antecub. fossa83        0.57                triphasic         +-----------------------+----------+--------------------+---------+--------+ Radial Art at Wrist    64        0.31                triphasic         +-----------------------+----------+--------------------+---------+--------+ Ulnar Art at Wrist     48        0.16                 triphasic         +-----------------------+----------+--------------------+---------+--------+  Summary:   Measurements above. *See table(s) above for measurements and observations. Electronically signed by Harold Barban MD on 03/01/2020 at 11:02:22 AM.    Final    VAS [place]  Result Date: 03/01/2020 UPPER EXTREMITY VEIN MAPPING  Indications: Pre-access. Performing Technologist: Alvia Grove RVT  Examination Guidelines: A complete evaluation includes B-mode imaging, spectral Doppler, color Doppler, and power Doppler as needed of all accessible portions of each vessel. Bilateral testing is considered an integral part of a complete examination. Limited examinations for reoccurring indications may be performed as noted. +-----------------+-------------+----------+---------+ Right Cephalic   Diameter (cm)Depth (cm)Findings  +-----------------+-------------+----------+---------+ Shoulder             0.63                         +-----------------+-------------+----------+---------+ Prox upper arm       0.52               branching +-----------------+-------------+----------+---------+ Mid upper arm        0.56                         +-----------------+-------------+----------+---------+ Dist upper arm       0.64  branching +-----------------+-------------+----------+---------+ Antecubital fossa 0.58 / 0.67                     +-----------------+-------------+----------+---------+ Prox forearm      0.53 / 0.41                     +-----------------+-------------+----------+---------+ Mid forearm          0.18               branching +-----------------+-------------+----------+---------+ Dist forearm         0.16                         +-----------------+-------------+----------+---------+ +-----------------+-------------+----------+---------+ Right Basilic    Diameter (cm)Depth (cm)Findings   +-----------------+-------------+----------+---------+ Mid upper arm        0.41                         +-----------------+-------------+----------+---------+ Dist upper arm       0.38                         +-----------------+-------------+----------+---------+ Antecubital fossa    0.45               branching +-----------------+-------------+----------+---------+ Prox forearm      0.32 / 0.28                     +-----------------+-------------+----------+---------+ +-----------------+------------------+----------+---------+ Left Cephalic      Diameter (cm)   Depth (cm)Findings  +-----------------+------------------+----------+---------+ Shoulder                0.46                           +-----------------+------------------+----------+---------+ Prox upper arm          0.43                           +-----------------+------------------+----------+---------+ Mid upper arm           0.33                           +-----------------+------------------+----------+---------+ Dist upper arm   0.48 / 0.57 / 0.39           valves   +-----------------+------------------+----------+---------+ Antecubital fossa   0.43 / 0.39              branching +-----------------+------------------+----------+---------+ Prox forearm        0.56 / 0.40                joins   +-----------------+------------------+----------+---------+ Mid forearm             0.37                           +-----------------+------------------+----------+---------+ Dist forearm            0.38                           +-----------------+------------------+----------+---------+ +-----------------+------------------+----------+---------+ Left Basilic       Diameter (cm)   Depth (cm)Findings  +-----------------+------------------+----------+---------+ Prox upper arm          0.65                           +-----------------+------------------+----------+---------+  Mid upper arm            0.42                           +-----------------+------------------+----------+---------+ Dist upper arm   0.48 / 0.80 / 0.44          branching +-----------------+------------------+----------+---------+ Antecubital fossa       0.35                           +-----------------+------------------+----------+---------+ Prox forearm            0.34                   joins   +-----------------+------------------+----------+---------+ Summary:   Measurements above. *See table(s) above for measurements and observations.  Diagnosing physician: Harold Barban MD Electronically signed by Harold Barban MD on 03/01/2020 at 11:00:53 AM.    Final      ASSESSMENT:  1. CLL, Rai stage IV: -BMBX on 08/10/2016 shows bone marrow involvement by CLL. -He also had bulky diffuse adenopathy with progressive thrombocytopenia. -Imbruvica 420 mg from 08/19/2016 through May 2018, discontinued due to subdural hematoma on 10/14/2016. -Subsequently offered chlorambucil and obinutuzumab. He refused obinutuzumab. Chlorambucil 30 mg every 2 weeks from June 2019 through 11/07/2018 due to severe weakness. -PET scan on 12/01/2019 showed multiple bilateral enlarged cervical lymph nodes, bilateral axillary and mediastinal adenopathy, prevascular lymph nodes, left lower paratracheal lymph node, minimal adenopathy in the retroperitoneum, external iliac lymph nodes. Spleen measures 16 cm. All lymph nodes measure less than 5 cm with SUV less than 5. 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 IGHV was mutated. -T p53 mutation is pending. -Venetoclax 10 mg started on 12/02/2019 (dose reduced secondary to Coreg), dose increased to 50 mg daily on 12/12/2019. -Dose increased to 100 mg daily on 12/19/2019, held on 12/25/2019 due to elevated creatinine. -Venetoclax 100 mg daily started back on 01/15/2020.  2. CKD: -Kidney biopsy on 12/19/2017 shows CLL involvement with diffuse severe interstitial nephritis and associated  tubular centric granulomatous reaction.   PLAN:  1. CLL, Rai stage IV: -He is continuing to tolerate Venetoclax 100 mg daily very well. -Because of slight worsening of creatinine, and lymphoma infiltration of the kidneys, I have recommended adding rituximab.  We discussed about side effects in detail.  We have previously done hepatitis panel which was negative.  LDH is normal. -Platelet count today is 66 slightly down. -Continue venetoclax at the same dose. -He will proceed with rituximab today.  He will be evaluated in 2 week for any signs of tumor lysis and other electrolyte abnormalities. -I will see him back in 4 weeks for follow-up prior to next treatment of rituximab.  2. CKD: -Creatinine slightly increased to 5.93 today.  Previously 5.6.  We will closely monitor.  3. Tumor lysis prophylaxis: -Uric acid today is normal.  We have discontinued allopurinol.  4. Macrocytic anemia: -Hemoglobin is 10.2 with MCV of 98.  No transfusion needed.   Orders placed this encounter:  No orders of the defined types were placed in this encounter.    Derek Jack, MD [place] [phone]   I, Milinda Antis, am acting as a scribe for Dr. Sanda Linger.  I, Derek Jack MD, have reviewed the above documentation for accuracy and completeness, and I agree with the above.

## 2020-03-11 NOTE — Progress Notes (Signed)
Andrew [name] presents today for D1C1 Rituximab. Pt denies any new changes or symptoms since last visit. He is still taking his Venetoclax and reports no side effects. Lab results and vitals have been reviewed, including Cr 5.93(hx CKD) and Platelet count of 66, and are stable and within parameters for treatment. Patient has been assessed by Dr. Delton Coombes who has approved proceeding with treatment today as planned.  Infusions tolerated without incident or complaint. VSS throughout and upon completion of treatment. Peripheral IV noted for positive blood return prior to, during, and post infusion. IV flushed and removed per protocol, see IV flowsheet for details. Discharged in satisfactory condition with follow up instructions.

## 2020-03-11 NOTE — Progress Notes (Signed)
Patient was assessed by Dr. Katragadda and labs have been reviewed.  Patient is okay to proceed with treatment today. Primary RN and pharmacy aware.   

## 2020-03-11 NOTE — Patient Instructions (Signed)
[place] at [facility] Discharge Instructions  You were seen today by Dr. Delton Coombes. He went over your recent results. You received your first treatment of rituximab today; you might develop a fever today, so take a Tylenol at home. Your next appointment will be with the nurse practitioner in 2 weeks for labs and follow-up. Dr. Delton Coombes will see you back in 4 weeks for labs and follow up.   Thank you for choosing [place] at [facility] to provide your oncology and hematology care.  To afford each patient quality time with our provider, please arrive at least 15 minutes before your scheduled appointment time.   If you have a lab appointment with the [facility] please come in thru the Main Entrance and check in at the main information desk  You need to re-schedule your appointment should you arrive 10 or more minutes late.  We strive to give you quality time with our providers, and arriving late affects you and other patients whose appointments are after yours.  Also, if you no show three or more times for appointments you may be dismissed from the clinic at the providers discretion.     Again, thank you for choosing [facility].  Our hope is that these requests will decrease the amount of time that you wait before being seen by our physicians.       _____________________________________________________________  Should you have questions after your visit to [facility], please contact our office at [phone] between the hours of 8:00 a.m. and 4:30 p.m.  Voicemails left after 4:00 p.m. will not be returned until the following business day.  For prescription refill requests, have your pharmacy contact our office and allow 72 hours.    Cancer Center Support Programs:   > Cancer Support Group  2nd Tuesday of the month 1pm-2pm, Journey Room

## 2020-03-11 NOTE — Progress Notes (Signed)
Delay in PA will delay the day.  MD wants to give 900 mg on day 1 and day 2.  Will give 400 mg on day 1 and 500 mg on day 2 with identical premeds.  T.O. Dr Erasmo Leventhal, RN/Aleph Nickson Ronnald Ramp, PharmD

## 2020-03-12 ENCOUNTER — Inpatient Hospital Stay (HOSPITAL_COMMUNITY): Payer: Medicare HMO

## 2020-03-12 ENCOUNTER — Other Ambulatory Visit (HOSPITAL_COMMUNITY): Payer: Self-pay | Admitting: *Deleted

## 2020-03-12 VITALS — BP 149/99 | HR 68 | Temp 97.5°F | Resp 16

## 2020-03-12 DIAGNOSIS — C9110 Chronic lymphocytic leukemia of B-cell type not having achieved remission: Secondary | ICD-10-CM

## 2020-03-12 DIAGNOSIS — Z5112 Encounter for antineoplastic immunotherapy: Secondary | ICD-10-CM | POA: Diagnosis not present

## 2020-03-12 MED ORDER — ACETAMINOPHEN 325 MG PO TABS
ORAL_TABLET | ORAL | Status: AC
  Filled 2020-03-12: qty 2

## 2020-03-12 MED ORDER — FAMOTIDINE IN NACL 20-0.9 MG/50ML-% IV SOLN
INTRAVENOUS | Status: AC
  Filled 2020-03-12: qty 50

## 2020-03-12 MED ORDER — FAMOTIDINE IN NACL 20-0.9 MG/50ML-% IV SOLN
20.0000 mg | Freq: Once | INTRAVENOUS | Status: AC
  Administered 2020-03-12: 20 mg via INTRAVENOUS

## 2020-03-12 MED ORDER — DIPHENHYDRAMINE HCL 50 MG/ML IJ SOLN
50.0000 mg | Freq: Once | INTRAMUSCULAR | Status: AC
  Administered 2020-03-12: 50 mg via INTRAVENOUS

## 2020-03-12 MED ORDER — ACETAMINOPHEN 325 MG PO TABS
650.0000 mg | ORAL_TABLET | Freq: Once | ORAL | Status: AC
  Administered 2020-03-12: 650 mg via ORAL

## 2020-03-12 MED ORDER — SODIUM CHLORIDE 0.9 % IV SOLN
500.0000 mg | Freq: Once | INTRAVENOUS | Status: AC
  Administered 2020-03-12: 500 mg via INTRAVENOUS
  Filled 2020-03-12: qty 50

## 2020-03-12 MED ORDER — ALLOPURINOL 300 MG PO TABS
300.0000 mg | ORAL_TABLET | Freq: Every day | ORAL | 3 refills | Status: DC
Start: 2020-03-12 — End: 2020-03-25

## 2020-03-12 MED ORDER — SODIUM CHLORIDE 0.9 % IV SOLN: Freq: Once | INTRAVENOUS | Status: AC

## 2020-03-12 MED ORDER — DIPHENHYDRAMINE HCL 50 MG/ML IJ SOLN
INTRAMUSCULAR | Status: AC
  Filled 2020-03-12: qty 1

## 2020-03-12 NOTE — Patient Instructions (Signed)
[facility] Discharge Instructions for Patients Receiving Chemotherapy  Today you received the following chemotherapy agents   To help prevent nausea and vomiting after your treatment, we encourage you to take your nausea medication   If you develop nausea and vomiting that is not controlled by your nausea medication, call the clinic.   BELOW ARE SYMPTOMS THAT SHOULD BE REPORTED IMMEDIATELY:  *FEVER GREATER THAN 100.5 F  *CHILLS WITH OR WITHOUT FEVER  NAUSEA AND VOMITING THAT IS NOT CONTROLLED WITH YOUR NAUSEA MEDICATION  *UNUSUAL SHORTNESS OF BREATH  *UNUSUAL BRUISING OR BLEEDING  TENDERNESS IN MOUTH AND THROAT WITH OR WITHOUT PRESENCE OF ULCERS  *URINARY PROBLEMS  *BOWEL PROBLEMS  UNUSUAL RASH Items with * indicate a potential emergency and should be followed up as soon as possible.  Feel free to call the clinic should you have any questions or concerns. The clinic phone number is [phone].  Please show the CHEMO ALERT CARD at check-in to the Emergency Department and triage nurse.   

## 2020-03-12 NOTE — Progress Notes (Signed)
No new issues reported by patient this morning. Will continue day 2 of treatment per protocol.  Blood pressure diastolic is elevated most of the day today. He took BP med this morning and take another tonight. Instructed patient to monitor his BP at home over the weekend and to let us know how it was on Monday.   Treatment given per orders. Patient tolerated it well without problems. Vitals stable and discharged home from clinic ambulatory in stable condition. Follow up as scheduled.

## 2020-03-15 ENCOUNTER — Telehealth (HOSPITAL_COMMUNITY): Payer: Self-pay

## 2020-03-15 DIAGNOSIS — I10 Essential (primary) hypertension: Secondary | ICD-10-CM | POA: Diagnosis not present

## 2020-03-15 NOTE — Telephone Encounter (Signed)
24 hr F/U Chemo call: Pt reports having a good weekend with only a mild H/A that he experienced on Saturday. His B/P was elevated some this weekend so he saw his PCP today and he plans on starting him on something for his blood pressure. Pt encouraged to call for any questions or problems and verbalized understanding

## 2020-03-23 DIAGNOSIS — N179 Acute kidney failure, unspecified: Secondary | ICD-10-CM | POA: Diagnosis not present

## 2020-03-23 DIAGNOSIS — Z8616 Personal history of COVID-19: Secondary | ICD-10-CM | POA: Diagnosis not present

## 2020-03-23 DIAGNOSIS — C9110 Chronic lymphocytic leukemia of B-cell type not having achieved remission: Secondary | ICD-10-CM | POA: Diagnosis not present

## 2020-03-23 DIAGNOSIS — N200 Calculus of kidney: Secondary | ICD-10-CM | POA: Diagnosis not present

## 2020-03-23 DIAGNOSIS — I129 Hypertensive chronic kidney disease with stage 1 through stage 4 chronic kidney disease, or unspecified chronic kidney disease: Secondary | ICD-10-CM | POA: Diagnosis not present

## 2020-03-23 DIAGNOSIS — N12 Tubulo-interstitial nephritis, not specified as acute or chronic: Secondary | ICD-10-CM | POA: Diagnosis not present

## 2020-03-23 DIAGNOSIS — N184 Chronic kidney disease, stage 4 (severe): Secondary | ICD-10-CM | POA: Diagnosis not present

## 2020-03-23 DIAGNOSIS — D696 Thrombocytopenia, unspecified: Secondary | ICD-10-CM | POA: Diagnosis not present

## 2020-03-25 ENCOUNTER — Inpatient Hospital Stay (HOSPITAL_COMMUNITY): Payer: Medicare HMO

## 2020-03-25 ENCOUNTER — Other Ambulatory Visit: Payer: Self-pay

## 2020-03-25 ENCOUNTER — Inpatient Hospital Stay (HOSPITAL_COMMUNITY): Payer: Medicare HMO | Admitting: Oncology

## 2020-03-25 VITALS — BP 136/81 | HR 65 | Temp 96.7°F | Resp 18 | Wt 220.7 lb

## 2020-03-25 DIAGNOSIS — I129 Hypertensive chronic kidney disease with stage 1 through stage 4 chronic kidney disease, or unspecified chronic kidney disease: Secondary | ICD-10-CM | POA: Diagnosis not present

## 2020-03-25 DIAGNOSIS — N400 Enlarged prostate without lower urinary tract symptoms: Secondary | ICD-10-CM | POA: Diagnosis not present

## 2020-03-25 DIAGNOSIS — I4891 Unspecified atrial fibrillation: Secondary | ICD-10-CM | POA: Diagnosis not present

## 2020-03-25 DIAGNOSIS — D649 Anemia, unspecified: Secondary | ICD-10-CM | POA: Diagnosis not present

## 2020-03-25 DIAGNOSIS — R7301 Impaired fasting glucose: Secondary | ICD-10-CM | POA: Diagnosis not present

## 2020-03-25 DIAGNOSIS — N184 Chronic kidney disease, stage 4 (severe): Secondary | ICD-10-CM | POA: Diagnosis not present

## 2020-03-25 DIAGNOSIS — D696 Thrombocytopenia, unspecified: Secondary | ICD-10-CM | POA: Diagnosis not present

## 2020-03-25 DIAGNOSIS — Z5112 Encounter for antineoplastic immunotherapy: Secondary | ICD-10-CM | POA: Diagnosis not present

## 2020-03-25 DIAGNOSIS — C9110 Chronic lymphocytic leukemia of B-cell type not having achieved remission: Secondary | ICD-10-CM | POA: Diagnosis not present

## 2020-03-25 DIAGNOSIS — N179 Acute kidney failure, unspecified: Secondary | ICD-10-CM

## 2020-03-25 DIAGNOSIS — E782 Mixed hyperlipidemia: Secondary | ICD-10-CM | POA: Diagnosis not present

## 2020-03-25 DIAGNOSIS — I10 Essential (primary) hypertension: Secondary | ICD-10-CM | POA: Diagnosis not present

## 2020-03-25 LAB — COMPREHENSIVE METABOLIC PANEL
ALT: 15 U/L (ref 0–44)
AST: 15 U/L (ref 15–41)
Albumin: 3.7 g/dL (ref 3.5–5.0)
Alkaline Phosphatase: 44 U/L (ref 38–126)
Anion gap: 8 (ref 5–15)
BUN: 65 mg/dL — ABNORMAL HIGH (ref 8–23)
CO2: 21 mmol/L — ABNORMAL LOW (ref 22–32)
Calcium: 8.5 mg/dL — ABNORMAL LOW (ref 8.9–10.3)
Chloride: 109 mmol/L (ref 98–111)
Creatinine, Ser: 5.7 mg/dL — ABNORMAL HIGH (ref 0.61–1.24)
GFR, Estimated: 9 mL/min — ABNORMAL LOW (ref >60)
Glucose, Bld: 91 mg/dL (ref 70–99)
Potassium: 3.9 mmol/L (ref 3.5–5.1)
Sodium: 138 mmol/L (ref 135–145)
Total Bilirubin: 0.8 mg/dL (ref 0.3–1.2)
Total Protein: 6.1 g/dL — ABNORMAL LOW (ref 6.5–8.1)

## 2020-03-25 LAB — CBC WITH DIFFERENTIAL/PLATELET
Abs Immature Granulocytes: 0.01 10*3/uL (ref 0.00–0.07)
Basophils Absolute: 0 10*3/uL (ref 0.0–0.1)
Basophils Relative: 0 %
Eosinophils Absolute: 0 10*3/uL (ref 0.0–0.5)
Eosinophils Relative: 1 %
HCT: 29.4 % — ABNORMAL LOW (ref 39.0–52.0)
Hemoglobin: 9.8 g/dL — ABNORMAL LOW (ref 13.0–17.0)
Immature Granulocytes: 0 %
Lymphocytes Relative: 14 %
Lymphs Abs: 0.4 10*3/uL — ABNORMAL LOW (ref 0.7–4.0)
MCH: 33.6 pg (ref 26.0–34.0)
MCHC: 33.3 g/dL (ref 30.0–36.0)
MCV: 100.7 fL — ABNORMAL HIGH (ref 80.0–100.0)
Monocytes Absolute: 0.4 10*3/uL (ref 0.1–1.0)
Monocytes Relative: 13 %
Neutro Abs: 2 10*3/uL (ref 1.7–7.7)
Neutrophils Relative %: 72 %
Platelets: 70 10*3/uL — ABNORMAL LOW (ref 150–400)
RBC: 2.92 MIL/uL — ABNORMAL LOW (ref 4.22–5.81)
RDW: 13.2 % (ref 11.5–15.5)
WBC: 2.8 10*3/uL — ABNORMAL LOW (ref 4.0–10.5)
nRBC: 0 % (ref 0.0–0.2)

## 2020-03-25 LAB — URIC ACID: Uric Acid, Serum: 6.1 mg/dL (ref 3.7–8.6)

## 2020-03-25 LAB — LACTATE DEHYDROGENASE: LDH: 120 U/L (ref 98–192)

## 2020-03-25 NOTE — Progress Notes (Signed)
Andrew [name] [address], [place] [address]   CLINIC:  Medical Oncology/Hematology  PCP:  Andrew Squibb, MD [address] [place] [address] [phone]   REASON FOR VISIT:  Follow-up for CLL  PRIOR THERAPY:  1. Imbruvica 420 mg from 08/19/2016 through 09/2016. 2. Chlorambucil 30 mg from 10/2017 through 11/07/2018.  NGS Results: Not done  CURRENT THERAPY: Venetoclax daily and rituximab monthly  BRIEF ONCOLOGIC HISTORY:  Oncology History  CLL (chronic lymphocytic leukemia) ([place])  03/09/2015 Initial Diagnosis   CLL (chronic lymphocytic leukemia) ([place])   08/03/2016 Imaging   CT neck- 1. Diffuse and bulky lymphadenopathy in keeping with history of CLL. Nodal enlargement has diffusely progressed since April 2016. 2. Chest and abdomen CT reported separately.   08/03/2016 Imaging   CT CAP- 1. Bulky lymphadenopathy in the chest, abdomen, and pelvis as described. 2. Splenomegaly. 3. Coronary artery and thoracoabdominal aortic atherosclerosis. 4. Prostatomegaly. 5. Several tiny lucent lesions in the bony anatomic pelvis, nonspecific, but attention on follow-up imaging recommended.   08/10/2016 Procedure   Bone marrow aspiration and biopsy by IR.   08/11/2016 Pathology Results   Bone Marrow, Aspirate,Biopsy, and Clot, left iliac crest - MARKED INVOLVEMENT BY CHRONIC LYMPHOCYTIC LEUKEMIA/SMALL LYMPHOCYTIC LYMPHOMA. PERIPHERAL BLOOD: - CHRONIC LYMPHOCYTIC LEUKEMIA. - THROMBOCYTOPENIA.   08/11/2016 Pathology Results   Bone Marrow Flow Cytometry - CHRONIC LYMPHOCYTIC LEUKEMIA/SMALL LYMPHOCYTIC LYMPHOMA.   08/16/2016 Pathology Results   Normal cytogenetics.  Molecular cytogenetic analysis by [facility] demonstrates a 13q-   08/19/2016 -  Chemotherapy   Imbruvica 420 mg daily    03/11/2020 -  Chemotherapy   The patient had riTUXimab-pvvr (RUXIENCE) 400 mg in sodium chloride 0.9 % 250 mL (1.3793 mg/mL) infusion, 900 mg, Intravenous,  Once, 1 of 6  cycles Dose modification: 500 mg/m2 (Cycle 2, Reason: Provider Judgment), 500 mg (original dose 500 mg, Cycle 1, Reason: Provider Judgment, Comment: day 2) Administration: 400 mg (03/11/2020), 500 mg (03/12/2020)  for chemotherapy treatment.      CANCER STAGING: Cancer Staging CLL (chronic lymphocytic leukemia) ([place]) Staging form: Chronic Lymphocytic Leukemia / Small Lymphocytic Lymphoma, AJCC 8th Edition - Clinical stage from 09/04/2016: Modified Rai Stage IV ([place]: Stage C, Modified Rai risk: High, Lymphocytosis: Present, Adenopathy: Present, Organomegaly: Present, Anemia: Absent, Thrombocytopenia: Present) - Signed by Baird Cancer, PA-C on 09/04/2016   INTERVAL HISTORY:  Andrew [name], a 79 y.o. male, returns to assess tolerance of immunotherapy, s/s of TLS or other lab abnormalities.  [name] was last seen on 03/11/20.   Overall, he tells me he has been feeling pretty well. He is tolerating the venetoclax well and denies having any issues.  He denies any side effects from Rituxan he received 2 weeks ago.  His breathing is good. He denies having any recent infections, F/C, or night sweats.  Overall, he feels he is tolerating his treatment well.   REVIEW OF SYSTEMS:  Review of Systems  Constitutional: Positive for fatigue. Negative for appetite change, chills and fever.  HENT:  Negative.  Negative for hearing loss, lump/mass, mouth sores and nosebleeds.   Eyes: Negative.  Negative for eye problems.  Respiratory: Negative for cough, hemoptysis and shortness of breath.   Cardiovascular: Negative.  Negative for chest pain and leg swelling.  Gastrointestinal: Negative.  Negative for abdominal pain, blood in stool, constipation, diarrhea, nausea and vomiting.  Endocrine: Negative.  Negative for hot flashes.  Genitourinary: Negative.  Negative for bladder incontinence, difficulty urinating, dysuria,  frequency and hematuria.   Musculoskeletal: Negative.  Negative for back pain, flank  pain, gait problem and myalgias.  Skin: Negative.  Negative for itching and rash.  Neurological: Negative.  Negative for dizziness, gait problem, headaches, light-headedness and numbness.  Hematological: Negative.  Negative for adenopathy.  Psychiatric/Behavioral: Negative for confusion. The patient is not nervous/anxious.     PAST MEDICAL/SURGICAL HISTORY:  Past Medical History:  Diagnosis Date  . CKD (chronic kidney disease), stage IV ([place])   . CLL (chronic lymphocytic leukemia) ([place])   . Hypertension   . NICM (nonischemic cardiomyopathy) ([place])    a. cath 2007, EF 35%, minimal CAD. b. EF normal 2018.  Marland Kitchen PAF (paroxysmal atrial fibrillation) ([place])   . Pinched nerve in neck   . SDH (subdural hematoma) ([place])   . Thrombocytopenia ([place])    Past Surgical History:  Procedure Laterality Date  . BIOPSY  08/05/2018   Procedure: BIOPSY;  Surgeon: Danie Binder, MD;  [facility];  Service: Endoscopy;;  gastric   . CATARACT EXTRACTION, BILATERAL    . COLONOSCOPY  07/10/2011   Procedure: COLONOSCOPY;  Surgeon: Dorothyann Peng, MD;  [facility];  Service: Endoscopy;  Laterality: N/A;  10:30 AM  . CRANIOTOMY N/A 10/14/2016   Procedure: CRANIOTOMY HEMATOMA EVACUATION SUBDURAL;  Surgeon: Jovita Gamma, MD;  Location: [facility];  Service: Neurosurgery;  Laterality: N/A;  CRANIOTOMY HEMATOMA EVACUATION SUBDURAL  . ESOPHAGOGASTRODUODENOSCOPY N/A 08/05/2018   Procedure: ESOPHAGOGASTRODUODENOSCOPY (EGD);  Surgeon: Danie Binder, MD;  [facility]: [facility];  Service: Endoscopy;  Laterality: N/A;  3:00pm  . THYROIDECTOMY, PARTIAL    . TONSILLECTOMY      SOCIAL HISTORY:  Social History   Socioeconomic History  . Marital status: Married    Spouse name: Not on file  . Number of children: Not on file  . Years of education: Not on file  . Highest education level: Not on file  Occupational History  . Not on file  Tobacco Use  . Smoking status: Former Smoker    Packs/day: 1.00     Years: 15.00    Pack years: 15.00    Quit date: 01/26/1971    Years since quitting: 49.1  . Smokeless tobacco: Never Used  Vaping Use  . Vaping Use: Never used  Substance and Sexual Activity  . Alcohol use: No  . Drug use: No  . Sexual activity: Not on file  Other Topics Concern  . Not on file  Social History Narrative  . Not on file   Social Determinants of Health   Financial Resource Strain:   . Difficulty of Paying Living Expenses: Not on file  Food Insecurity:   . Worried About Charity fundraiser in the Last Year: Not on file  . Ran Out of Food in the Last Year: Not on file  Transportation Needs:   . Lack of Transportation (Medical): Not on file  . Lack of Transportation (Non-Medical): Not on file  Physical Activity:   . Days of Exercise per Week: Not on file  . Minutes of Exercise per Session: Not on file  Stress:   . Feeling of Stress : Not on file  Social Connections:   . Frequency of Communication with Friends and Family: Not on file  . Frequency of Social Gatherings with Friends and Family: Not on file  . Attends Religious Services: Not on file  . Active Member of Clubs or Organizations: Not on file  . Attends Club or  Organization Meetings: Not on file  . Marital Status: Not on file  Intimate Partner Violence:   . Fear of Current or Ex-Partner: Not on file  . Emotionally Abused: Not on file  . Physically Abused: Not on file  . Sexually Abused: Not on file    FAMILY HISTORY:  Family History  Problem Relation Age of Onset  . CAD Father 72  . Cancer Mother   . Alzheimer's disease Mother   . Colon cancer Neg Hx     CURRENT MEDICATIONS:  Current Outpatient Medications  Medication Sig Dispense Refill  . amLODipine (NORVASC) 5 MG tablet Take 5 mg by mouth at bedtime.    . carvedilol (COREG) 6.25 MG tablet Take 1 tablet (6.25 mg total) by mouth 2 (two) times daily. 180 tablet 3  . Cyanocobalamin (VITAMIN B12) 1000 MCG TBCR Take 1,000 mcg by mouth daily.      . Multiple Vitamin (MULITIVITAMIN WITH MINERALS) TABS Take 1 tablet by mouth daily.    Marland Kitchen omeprazole (PRILOSEC) 20 MG capsule 1 PO 30 MINS PRIOR TO BREAKFAST. 90 capsule 3  . sodium bicarbonate 325 MG tablet Take 325 mg by mouth 3 (three) times daily.    . tamsulosin (FLOMAX) 0.4 MG CAPS capsule Take 0.4 mg by mouth daily.     . VENCLEXTA 100 MG TABS TAKE 2 TABLETS (200 MG TOTAL) BY MOUTH DAILY (Patient taking differently: Take 100 mg by mouth daily. Patient is only taking one tablet daily) 60 tablet 0   No current facility-administered medications for this visit.    ALLERGIES:  No Known Allergies  PHYSICAL EXAM:  Performance status (ECOG): 1 - Symptomatic but completely ambulatory  Vitals:   03/25/20 0943  BP: 136/81  Pulse: 65  Resp: 18  Temp: (!) 96.7 F (35.9 C)  SpO2: 100%   Wt Readings from Last 3 Encounters:  03/25/20 220 lb 10.9 oz (100.1 kg)  03/11/20 218 lb 12.8 oz (99.2 kg)  03/01/20 217 lb (98.4 kg)   Physical Exam Vitals reviewed.  Constitutional:      Appearance: Normal appearance.  Cardiovascular:     Rate and Rhythm: Normal rate and regular rhythm.     Pulses: Normal pulses.     Heart sounds: Normal heart sounds.  Pulmonary:     Effort: Pulmonary effort is normal.     Breath sounds: Normal breath sounds.  Abdominal:     Palpations: Abdomen is soft. There is no hepatomegaly, splenomegaly or mass.     Tenderness: There is no abdominal tenderness.     Hernia: No hernia is present.  Lymphadenopathy:     Upper Body:     Right upper body: No supraclavicular, axillary or pectoral adenopathy.     Left upper body: No supraclavicular, axillary or pectoral adenopathy.  Neurological:     General: No focal deficit present.     Mental Status: He is alert and oriented to person, place, and time.  Psychiatric:        Mood and Affect: Mood normal.        Behavior: Behavior normal.     LABORATORY DATA:  I have reviewed the labs as listed.  CBC Latest Ref  Rng & Units 03/25/2020 03/11/2020 02/26/2020  WBC 4.0 - 10.5 K/uL 2.8(L) 2.9(L) 2.5(L)  Hemoglobin 13.0 - 17.0 g/dL 9.8(L) 10.2(L) 10.0(L)  Hematocrit 39 - 52 % 29.4(L) 29.8(L) 30.0(L)  Platelets 150 - 400 K/uL 70(L) 66(L) 75(L)   CMP Latest Ref Rng & Units 03/25/2020 03/11/2020  02/26/2020  Glucose 70 - 99 mg/dL 91 131(H) 133(H)  BUN 8 - 23 mg/dL 65(H) 65(H) 61(H)  Creatinine 0.61 - 1.24 mg/dL 5.70(H) 5.93(H) 5.59(H)  Sodium 135 - 145 mmol/L 138 139 139  Potassium 3.5 - 5.1 mmol/L 3.9 3.7 4.1  Chloride 98 - 111 mmol/L 109 110 110  CO2 22 - 32 mmol/L 21(L) 20(L) 21(L)  Calcium 8.9 - 10.3 mg/dL 8.5(L) 8.8(L) 8.7(L)  Total Protein 6.5 - 8.1 g/dL 6.1(L) 6.2(L) 6.1(L)  Total Bilirubin 0.3 - 1.2 mg/dL 0.8 1.0 0.6  Alkaline Phos 38 - 126 U/L 44 49 49  AST 15 - 41 U/L 15 14(L) 13(L)  ALT 0 - 44 U/L '15 15 12   ' Lab Results  Component Value Date   LDH 120 03/25/2020   LDH 115 03/11/2020   LDH 120 02/26/2020    DIAGNOSTIC IMAGING:  I have independently reviewed the scans and discussed with the patient. VAS [place] UPPER EXTREMITY ARTERIAL DUPLEX  Result Date: 03/01/2020 UPPER EXTREMITY DUPLEX STUDY Indications: Patient complains of Pre access.  Performing Technologist: Alvia Grove RVT  Examination Guidelines: A complete evaluation includes B-mode imaging, spectral Doppler, color Doppler, and power Doppler as needed of all accessible portions of each vessel. Bilateral testing is considered an integral part of a complete examination. Limited examinations for reoccurring indications may be performed as noted.  Right Pre-Dialysis Findings: +-----------------------+----------+--------------------+---------+--------+ Location               PSV (cm/s)Intralum. Diam. (cm)Waveform Comments +-----------------------+----------+--------------------+---------+--------+ Brachial Antecub. fossa55        0.62                triphasic          +-----------------------+----------+--------------------+---------+--------+ Radial Art at Wrist    63        0.30                triphasic         +-----------------------+----------+--------------------+---------+--------+ Ulnar Art at Wrist     59        0.30                triphasic         +-----------------------+----------+--------------------+---------+--------+ Left Pre-Dialysis Findings: +-----------------------+----------+--------------------+---------+--------+ Location               PSV (cm/s)Intralum. Diam. (cm)Waveform Comments +-----------------------+----------+--------------------+---------+--------+ Brachial Antecub. fossa83        0.57                triphasic         +-----------------------+----------+--------------------+---------+--------+ Radial Art at Wrist    64        0.31                triphasic         +-----------------------+----------+--------------------+---------+--------+ Ulnar Art at Wrist     48        0.16                triphasic         +-----------------------+----------+--------------------+---------+--------+  Summary:   Measurements above. *See table(s) above for measurements and observations. Electronically signed by Harold Barban MD on 03/01/2020 at 11:02:22 AM.    Final    VAS [place]  Result Date: 03/01/2020 UPPER EXTREMITY VEIN MAPPING  Indications: Pre-access. Performing Technologist: Alvia Grove RVT  Examination Guidelines: A complete evaluation includes B-mode imaging, spectral Doppler, color Doppler, and power Doppler as needed of all accessible portions of each  vessel. Bilateral testing is considered an integral part of a complete examination. Limited examinations for reoccurring indications may be performed as noted. +-----------------+-------------+----------+---------+ Right Cephalic   Diameter (cm)Depth (cm)Findings  +-----------------+-------------+----------+---------+ Shoulder              0.63                         +-----------------+-------------+----------+---------+ Prox upper arm       0.52               branching +-----------------+-------------+----------+---------+ Mid upper arm        0.56                         +-----------------+-------------+----------+---------+ Dist upper arm       0.64               branching +-----------------+-------------+----------+---------+ Antecubital fossa 0.58 / 0.67                     +-----------------+-------------+----------+---------+ Prox forearm      0.53 / 0.41                     +-----------------+-------------+----------+---------+ Mid forearm          0.18               branching +-----------------+-------------+----------+---------+ Dist forearm         0.16                         +-----------------+-------------+----------+---------+ +-----------------+-------------+----------+---------+ Right Basilic    Diameter (cm)Depth (cm)Findings  +-----------------+-------------+----------+---------+ Mid upper arm        0.41                         +-----------------+-------------+----------+---------+ Dist upper arm       0.38                         +-----------------+-------------+----------+---------+ Antecubital fossa    0.45               branching +-----------------+-------------+----------+---------+ Prox forearm      0.32 / 0.28                     +-----------------+-------------+----------+---------+ +-----------------+------------------+----------+---------+ Left Cephalic      Diameter (cm)   Depth (cm)Findings  +-----------------+------------------+----------+---------+ Shoulder                0.46                           +-----------------+------------------+----------+---------+ Prox upper arm          0.43                           +-----------------+------------------+----------+---------+ Mid upper arm           0.33                            +-----------------+------------------+----------+---------+ Dist upper arm   0.48 / 0.57 / 0.39           valves   +-----------------+------------------+----------+---------+ Antecubital fossa   0.43 / 0.39  branching +-----------------+------------------+----------+---------+ Prox forearm        0.56 / 0.40                joins   +-----------------+------------------+----------+---------+ Mid forearm             0.37                           +-----------------+------------------+----------+---------+ Dist forearm            0.38                           +-----------------+------------------+----------+---------+ +-----------------+------------------+----------+---------+ Left Basilic       Diameter (cm)   Depth (cm)Findings  +-----------------+------------------+----------+---------+ Prox upper arm          0.65                           +-----------------+------------------+----------+---------+ Mid upper arm           0.42                           +-----------------+------------------+----------+---------+ Dist upper arm   0.48 / 0.80 / 0.44          branching +-----------------+------------------+----------+---------+ Antecubital fossa       0.35                           +-----------------+------------------+----------+---------+ Prox forearm            0.34                   joins   +-----------------+------------------+----------+---------+ Summary:   Measurements above. *See table(s) above for measurements and observations.  Diagnosing physician: Harold Barban MD Electronically signed by Harold Barban MD on 03/01/2020 at 11:00:53 AM.    Final      ASSESSMENT:  1. CLL, Rai stage IV: -BMBX on 08/10/2016 shows bone marrow involvement by CLL. -He also had bulky diffuse adenopathy with progressive thrombocytopenia. -Imbruvica 420 mg from 08/19/2016 through May 2018, discontinued due to subdural hematoma on 10/14/2016. -Subsequently offered  chlorambucil and obinutuzumab. He refused obinutuzumab. Chlorambucil 30 mg every 2 weeks from June 2019 through 11/07/2018 due to severe weakness. -PET scan on 12/01/2019 showed multiple bilateral enlarged cervical lymph nodes, bilateral axillary and mediastinal adenopathy, prevascular lymph nodes, left lower paratracheal lymph node, minimal adenopathy in the retroperitoneum, external iliac lymph nodes. Spleen measures 16 cm. All lymph nodes measure less than 5 cm with SUV less than 5. 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 IGHV was mutated. -T p53 mutation is pending. -Venetoclax 10 mg started on 12/02/2019 (dose reduced secondary to Coreg), dose increased to 50 mg daily on 12/12/2019. -Dose increased to 100 mg daily on 12/19/2019, held on 12/25/2019 due to elevated creatinine. -Venetoclax 100 mg daily started back on 01/15/2020.  2. 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   PLAN:  1. CLL, Rai stage IV: -He is continuing to tolerate Venetoclax 100 mg daily very well. -Because of slight worsening of creatinine, and lymphoma infiltration of the kidneys, Dr. Delton Coombes recently started him on rituximab. Received first cycle on 03/11/2020. -Platelet count today is 70,000. -Continue venetoclax at the same dose. -No signs of TLS today.  -He  scheduled to return to clinic in 2 weeks with lab work and next cycle of Rituxan.  2. CKD: -Creatinine has slightly improved and is 5.7 today with a BUN of 65.  Previously 5.93 with a BUN of 65.  3. Tumor lysis prophylaxis: -Uric acid today is normal.  We have discontinued allopurinol.  4. Macrocytic anemia: -Hemoglobin is 9.8 with MCV of 100.7.  No transfusion needed.   Orders placed this encounter:  No orders of the defined types were placed in this encounter.  Greater than 50% was spent in counseling and coordination of care with  this patient including but not limited to discussion of the relevant topics above (See A&P) including, but not limited to diagnosis and management of acute and chronic medical conditions.   Faythe Casa, NP 03/25/2020 4:01 PM  [place] [phone]

## 2020-03-29 DIAGNOSIS — N184 Chronic kidney disease, stage 4 (severe): Secondary | ICD-10-CM | POA: Diagnosis not present

## 2020-03-29 DIAGNOSIS — D649 Anemia, unspecified: Secondary | ICD-10-CM | POA: Diagnosis not present

## 2020-03-29 DIAGNOSIS — C9110 Chronic lymphocytic leukemia of B-cell type not having achieved remission: Secondary | ICD-10-CM | POA: Diagnosis not present

## 2020-03-29 DIAGNOSIS — R7301 Impaired fasting glucose: Secondary | ICD-10-CM | POA: Diagnosis not present

## 2020-03-29 DIAGNOSIS — I4891 Unspecified atrial fibrillation: Secondary | ICD-10-CM | POA: Diagnosis not present

## 2020-03-29 DIAGNOSIS — I129 Hypertensive chronic kidney disease with stage 1 through stage 4 chronic kidney disease, or unspecified chronic kidney disease: Secondary | ICD-10-CM | POA: Diagnosis not present

## 2020-03-29 DIAGNOSIS — N400 Enlarged prostate without lower urinary tract symptoms: Secondary | ICD-10-CM | POA: Diagnosis not present

## 2020-03-29 DIAGNOSIS — D696 Thrombocytopenia, unspecified: Secondary | ICD-10-CM | POA: Diagnosis not present

## 2020-03-29 DIAGNOSIS — E782 Mixed hyperlipidemia: Secondary | ICD-10-CM | POA: Diagnosis not present

## 2020-03-29 DIAGNOSIS — I10 Essential (primary) hypertension: Secondary | ICD-10-CM | POA: Diagnosis not present

## 2020-04-02 DIAGNOSIS — D0439 Carcinoma in situ of skin of other parts of face: Secondary | ICD-10-CM | POA: Diagnosis not present

## 2020-04-06 ENCOUNTER — Other Ambulatory Visit: Payer: Self-pay

## 2020-04-08 ENCOUNTER — Inpatient Hospital Stay (HOSPITAL_COMMUNITY): Payer: Medicare HMO | Attending: Hematology | Admitting: Hematology

## 2020-04-08 ENCOUNTER — Inpatient Hospital Stay (HOSPITAL_COMMUNITY): Payer: Medicare HMO

## 2020-04-08 ENCOUNTER — Encounter (HOSPITAL_COMMUNITY): Payer: Self-pay

## 2020-04-08 ENCOUNTER — Other Ambulatory Visit: Payer: Self-pay

## 2020-04-08 VITALS — BP 120/63 | HR 62 | Temp 97.3°F | Resp 18 | Wt 223.0 lb

## 2020-04-08 VITALS — BP 133/83 | HR 60 | Temp 96.0°F | Resp 18

## 2020-04-08 DIAGNOSIS — D539 Nutritional anemia, unspecified: Secondary | ICD-10-CM | POA: Insufficient documentation

## 2020-04-08 DIAGNOSIS — Z87891 Personal history of nicotine dependence: Secondary | ICD-10-CM | POA: Insufficient documentation

## 2020-04-08 DIAGNOSIS — C9110 Chronic lymphocytic leukemia of B-cell type not having achieved remission: Secondary | ICD-10-CM

## 2020-04-08 DIAGNOSIS — Z79899 Other long term (current) drug therapy: Secondary | ICD-10-CM | POA: Diagnosis not present

## 2020-04-08 DIAGNOSIS — N184 Chronic kidney disease, stage 4 (severe): Secondary | ICD-10-CM | POA: Insufficient documentation

## 2020-04-08 DIAGNOSIS — I480 Paroxysmal atrial fibrillation: Secondary | ICD-10-CM | POA: Diagnosis not present

## 2020-04-08 DIAGNOSIS — Z5112 Encounter for antineoplastic immunotherapy: Secondary | ICD-10-CM | POA: Diagnosis present

## 2020-04-08 DIAGNOSIS — I700 Atherosclerosis of aorta: Secondary | ICD-10-CM | POA: Diagnosis not present

## 2020-04-08 DIAGNOSIS — N400 Enlarged prostate without lower urinary tract symptoms: Secondary | ICD-10-CM | POA: Diagnosis not present

## 2020-04-08 DIAGNOSIS — I129 Hypertensive chronic kidney disease with stage 1 through stage 4 chronic kidney disease, or unspecified chronic kidney disease: Secondary | ICD-10-CM | POA: Diagnosis not present

## 2020-04-08 DIAGNOSIS — D696 Thrombocytopenia, unspecified: Secondary | ICD-10-CM | POA: Diagnosis not present

## 2020-04-08 DIAGNOSIS — Z9221 Personal history of antineoplastic chemotherapy: Secondary | ICD-10-CM | POA: Insufficient documentation

## 2020-04-08 LAB — COMPREHENSIVE METABOLIC PANEL
ALT: 14 U/L (ref 0–44)
AST: 15 U/L (ref 15–41)
Albumin: 3.7 g/dL (ref 3.5–5.0)
Alkaline Phosphatase: 47 U/L (ref 38–126)
Anion gap: 9 (ref 5–15)
BUN: 64 mg/dL — ABNORMAL HIGH (ref 8–23)
CO2: 20 mmol/L — ABNORMAL LOW (ref 22–32)
Calcium: 8.6 mg/dL — ABNORMAL LOW (ref 8.9–10.3)
Chloride: 110 mmol/L (ref 98–111)
Creatinine, Ser: 6.25 mg/dL — ABNORMAL HIGH (ref 0.61–1.24)
GFR, Estimated: 8 mL/min — ABNORMAL LOW (ref >60)
Glucose, Bld: 106 mg/dL — ABNORMAL HIGH (ref 70–99)
Potassium: 3.9 mmol/L (ref 3.5–5.1)
Sodium: 139 mmol/L (ref 135–145)
Total Bilirubin: 0.8 mg/dL (ref 0.3–1.2)
Total Protein: 6.1 g/dL — ABNORMAL LOW (ref 6.5–8.1)

## 2020-04-08 LAB — CBC WITH DIFFERENTIAL/PLATELET
Abs Immature Granulocytes: 0.01 10*3/uL (ref 0.00–0.07)
Basophils Absolute: 0 10*3/uL (ref 0.0–0.1)
Basophils Relative: 0 %
Eosinophils Absolute: 0.1 10*3/uL (ref 0.0–0.5)
Eosinophils Relative: 2 %
HCT: 29.1 % — ABNORMAL LOW (ref 39.0–52.0)
Hemoglobin: 10 g/dL — ABNORMAL LOW (ref 13.0–17.0)
Immature Granulocytes: 0 %
Lymphocytes Relative: 13 %
Lymphs Abs: 0.5 10*3/uL — ABNORMAL LOW (ref 0.7–4.0)
MCH: 34.5 pg — ABNORMAL HIGH (ref 26.0–34.0)
MCHC: 34.4 g/dL (ref 30.0–36.0)
MCV: 100.3 fL — ABNORMAL HIGH (ref 80.0–100.0)
Monocytes Absolute: 0.4 10*3/uL (ref 0.1–1.0)
Monocytes Relative: 12 %
Neutro Abs: 2.5 10*3/uL (ref 1.7–7.7)
Neutrophils Relative %: 73 %
Platelets: 75 10*3/uL — ABNORMAL LOW (ref 150–400)
RBC: 2.9 MIL/uL — ABNORMAL LOW (ref 4.22–5.81)
RDW: 13.2 % (ref 11.5–15.5)
WBC: 3.5 10*3/uL — ABNORMAL LOW (ref 4.0–10.5)
nRBC: 0 % (ref 0.0–0.2)

## 2020-04-08 LAB — URIC ACID: Uric Acid, Serum: 6.1 mg/dL (ref 3.7–8.6)

## 2020-04-08 LAB — LACTATE DEHYDROGENASE: LDH: 115 U/L (ref 98–192)

## 2020-04-08 MED ORDER — SODIUM CHLORIDE 0.9 % IV SOLN
500.0000 mg/m2 | Freq: Once | INTRAVENOUS | Status: AC
  Administered 2020-04-08: 1100 mg via INTRAVENOUS
  Filled 2020-04-08: qty 100

## 2020-04-08 MED ORDER — SODIUM CHLORIDE 0.9% FLUSH: 10.0000 mL | INTRAVENOUS | Status: DC | PRN

## 2020-04-08 MED ORDER — SODIUM CHLORIDE 0.9 % IV SOLN: Freq: Once | INTRAVENOUS | Status: AC

## 2020-04-08 MED ORDER — ACETAMINOPHEN 325 MG PO TABS
650.0000 mg | ORAL_TABLET | Freq: Once | ORAL | Status: AC
  Administered 2020-04-08: 650 mg via ORAL
  Filled 2020-04-08: qty 2

## 2020-04-08 MED ORDER — HEPARIN SOD (PORK) LOCK FLUSH 100 UNIT/ML IV SOLN: 500.0000 [IU] | Freq: Once | INTRAVENOUS | Status: DC | PRN

## 2020-04-08 MED ORDER — FAMOTIDINE IN NACL 20-0.9 MG/50ML-% IV SOLN
20.0000 mg | Freq: Once | INTRAVENOUS | Status: AC
  Administered 2020-04-08: 20 mg via INTRAVENOUS
  Filled 2020-04-08: qty 50

## 2020-04-08 MED ORDER — SODIUM CHLORIDE 0.9 % IV SOLN: INTRAVENOUS | Status: AC

## 2020-04-08 MED ORDER — DIPHENHYDRAMINE HCL 50 MG/ML IJ SOLN
50.0000 mg | Freq: Once | INTRAMUSCULAR | Status: AC
  Administered 2020-04-08: 50 mg via INTRAVENOUS
  Filled 2020-04-08: qty 1

## 2020-04-08 NOTE — Patient Instructions (Signed)
[facility] Discharge Instructions for Patients Receiving Chemotherapy  Today you received the following chemotherapy agents   To help prevent nausea and vomiting after your treatment, we encourage you to take your nausea medication   If you develop nausea and vomiting that is not controlled by your nausea medication, call the clinic.   BELOW ARE SYMPTOMS THAT SHOULD BE REPORTED IMMEDIATELY:  *FEVER GREATER THAN 100.5 F  *CHILLS WITH OR WITHOUT FEVER  NAUSEA AND VOMITING THAT IS NOT CONTROLLED WITH YOUR NAUSEA MEDICATION  *UNUSUAL SHORTNESS OF BREATH  *UNUSUAL BRUISING OR BLEEDING  TENDERNESS IN MOUTH AND THROAT WITH OR WITHOUT PRESENCE OF ULCERS  *URINARY PROBLEMS  *BOWEL PROBLEMS  UNUSUAL RASH Items with * indicate a potential emergency and should be followed up as soon as possible.  Feel free to call the clinic should you have any questions or concerns. The clinic phone number is [phone].  Please show the CHEMO ALERT CARD at check-in to the Emergency Department and triage nurse.   

## 2020-04-08 NOTE — Progress Notes (Signed)
 [facility] [address] [place], [place] [address]   CLINIC:  Medical Oncology/Hematology  PCP:  Hall, John Z, MD [address] / [place] [address] [phone]   REASON FOR VISIT:  Follow-up for CLL  PRIOR THERAPY:  1. Imbruvica 420 mg from 08/19/2016 through 09/2016. 2. Chlorambucil 30 mg from 10/2017 through 11/07/2018.  NGS Results: Not done  CURRENT THERAPY: Venetoclax daily and rituximab monthly  BRIEF ONCOLOGIC HISTORY:  Oncology History  CLL (chronic lymphocytic leukemia) (HCC)  03/09/2015 Initial Diagnosis   CLL (chronic lymphocytic leukemia) (HCC)   08/03/2016 Imaging   CT neck- 1. Diffuse and bulky lymphadenopathy in keeping with history of CLL. Nodal enlargement has diffusely progressed since April 2016. 2. Chest and abdomen CT reported separately.   08/03/2016 Imaging   CT CAP- 1. Bulky lymphadenopathy in the chest, abdomen, and pelvis as described. 2. Splenomegaly. 3. Coronary artery and thoracoabdominal aortic atherosclerosis. 4. Prostatomegaly. 5. Several tiny lucent lesions in the bony anatomic pelvis, nonspecific, but attention on follow-up imaging recommended.   08/10/2016 Procedure   Bone marrow aspiration and biopsy by IR.   08/11/2016 Pathology Results   Bone Marrow, Aspirate,Biopsy, and Clot, left iliac crest - MARKED INVOLVEMENT BY CHRONIC LYMPHOCYTIC LEUKEMIA/SMALL LYMPHOCYTIC LYMPHOMA. PERIPHERAL BLOOD: - CHRONIC LYMPHOCYTIC LEUKEMIA. - THROMBOCYTOPENIA.   08/11/2016 Pathology Results   Bone Marrow Flow Cytometry - CHRONIC LYMPHOCYTIC LEUKEMIA/SMALL LYMPHOCYTIC LYMPHOMA.   08/16/2016 Pathology Results   Normal cytogenetics.  Molecular cytogenetic analysis by FISH demonstrates a 13q-   08/19/2016 -  Chemotherapy   Imbruvica 420 mg daily    03/11/2020 -  Chemotherapy   The patient had riTUXimab-pvvr (RUXIENCE) 400 mg in sodium chloride 0.9 % 250 mL (1.3793 mg/mL) infusion, 900 mg, Intravenous,  Once, 1 of 6  cycles Dose modification: 500 mg/m2 (Cycle 2, Reason: Provider Judgment), 500 mg (original dose 500 mg, Cycle 1, Reason: Provider Judgment, Comment: day 2) Administration: 400 mg (03/11/2020), 500 mg (03/12/2020)  for chemotherapy treatment.      CANCER STAGING: Cancer Staging CLL (chronic lymphocytic leukemia) (HCC) Staging form: Chronic Lymphocytic Leukemia / Small Lymphocytic Lymphoma, AJCC 8th Edition - Clinical stage from 09/04/2016: Modified Rai Stage IV (Binet: Stage C, Modified Rai risk: High, Lymphocytosis: Present, Adenopathy: Present, Organomegaly: Present, Anemia: Absent, Thrombocytopenia: Present) - Signed by Kefalas, Thomas S, PA-C on 09/04/2016   INTERVAL HISTORY:  [name], a 79 y.o. male, returns for routine follow-up and consideration for next cycle of immunotherapy. Knute was last seen on 03/11/2020.  Due for cycle #2 of rituximab today.   Today he is accompanied by his wife. Overall, he tells me he has been feeling pretty well. He reports that he started taking Norvasc. He is taking venetoclax 100 mg daily and he is not taking anything OTC. He denies having any urinary issues. His appetite is excellent and he denies N/V. He denies leg swelling. He is drinking over 100 ounces of water daily.  Overall, he feels ready for next cycle of immunotherapy today.    REVIEW OF SYSTEMS:  Review of Systems  Constitutional: Positive for fatigue (75%). Negative for appetite change.  Cardiovascular: Negative for leg swelling.  Gastrointestinal: Negative for nausea and vomiting.  All other systems reviewed and are negative.   PAST MEDICAL/SURGICAL HISTORY:  Past Medical History:  Diagnosis Date  . CKD (chronic kidney disease), stage IV (HCC)   . CLL (chronic lymphocytic leukemia) (HCC)   . Hypertension   . NICM (nonischemic   cardiomyopathy) ([place])    a. cath 2007, EF 35%, minimal CAD. b. EF normal 2018.  Marland Kitchen PAF (paroxysmal atrial fibrillation) ([place])   . Pinched nerve in  neck   . SDH (subdural hematoma) ([place])   . Thrombocytopenia ([place])    Past Surgical History:  Procedure Laterality Date  . BIOPSY  08/05/2018   Procedure: BIOPSY;  Surgeon: Danie Binder, MD;  [facility];  Service: Endoscopy;;  gastric   . CATARACT EXTRACTION, BILATERAL    . COLONOSCOPY  07/10/2011   Procedure: COLONOSCOPY;  Surgeon: Dorothyann Peng, MD;  [facility];  Service: Endoscopy;  Laterality: N/A;  10:30 AM  . CRANIOTOMY N/A 10/14/2016   Procedure: CRANIOTOMY HEMATOMA EVACUATION SUBDURAL;  Surgeon: Jovita Gamma, MD;  Location: [facility];  Service: Neurosurgery;  Laterality: N/A;  CRANIOTOMY HEMATOMA EVACUATION SUBDURAL  . ESOPHAGOGASTRODUODENOSCOPY N/A 08/05/2018   Procedure: ESOPHAGOGASTRODUODENOSCOPY (EGD);  Surgeon: Danie Binder, MD;  [facility]: [facility];  Service: Endoscopy;  Laterality: N/A;  3:00pm  . THYROIDECTOMY, PARTIAL    . TONSILLECTOMY      SOCIAL HISTORY:  Social History   Socioeconomic History  . Marital status: Married    Spouse name: Not on file  . Number of children: Not on file  . Years of education: Not on file  . Highest education level: Not on file  Occupational History  . Not on file  Tobacco Use  . Smoking status: Former Smoker    Packs/day: 1.00    Years: 15.00    Pack years: 15.00    Quit date: 01/26/1971    Years since quitting: 49.2  . Smokeless tobacco: Never Used  Vaping Use  . Vaping Use: Never used  Substance and Sexual Activity  . Alcohol use: No  . Drug use: No  . Sexual activity: Not on file  Other Topics Concern  . Not on file  Social History Narrative  . Not on file   Social Determinants of Health   Financial Resource Strain:   . Difficulty of Paying Living Expenses: Not on file  Food Insecurity:   . Worried About Charity fundraiser in the Last Year: Not on file  . Ran Out of Food in the Last Year: Not on file  Transportation Needs:   . Lack of Transportation (Medical): Not on file  .  Lack of Transportation (Non-Medical): Not on file  Physical Activity:   . Days of Exercise per Week: Not on file  . Minutes of Exercise per Session: Not on file  Stress:   . Feeling of Stress : Not on file  Social Connections:   . Frequency of Communication with Friends and Family: Not on file  . Frequency of Social Gatherings with Friends and Family: Not on file  . Attends Religious Services: Not on file  . Active Member of Clubs or Organizations: Not on file  . Attends Archivist Meetings: Not on file  . Marital Status: Not on file  Intimate Partner Violence:   . Fear of Current or Ex-Partner: Not on file  . Emotionally Abused: Not on file  . Physically Abused: Not on file  . Sexually Abused: Not on file    FAMILY HISTORY:  Family History  Problem Relation Age of Onset  . CAD Father 67  . Cancer Mother   . Alzheimer's disease Mother   . Colon cancer Neg Hx     CURRENT MEDICATIONS:  Current Outpatient Medications  Medication Sig  Dispense Refill  . amLODipine (NORVASC) 5 MG tablet Take 5 mg by mouth at bedtime.    . carvedilol (COREG) 6.25 MG tablet Take 1 tablet (6.25 mg total) by mouth 2 (two) times daily. 180 tablet 3  . Cyanocobalamin (VITAMIN B12) 1000 MCG TBCR Take 1,000 mcg by mouth daily.     . Multiple Vitamin (MULITIVITAMIN WITH MINERALS) TABS Take 1 tablet by mouth daily.    . omeprazole (PRILOSEC) 20 MG capsule 1 PO 30 MINS PRIOR TO BREAKFAST. 90 capsule 3  . sodium bicarbonate 325 MG tablet Take 325 mg by mouth 3 (three) times daily.    . tamsulosin (FLOMAX) 0.4 MG CAPS capsule Take 0.4 mg by mouth daily.     . VENCLEXTA 100 MG TABS TAKE 2 TABLETS (200 MG TOTAL) BY MOUTH DAILY (Patient taking differently: Take 100 mg by mouth daily. Patient is only taking one tablet daily) 60 tablet 0   No current facility-administered medications for this visit.    ALLERGIES:  No Known Allergies  PHYSICAL EXAM:  Performance status (ECOG): 1 - Symptomatic but  completely ambulatory  Vitals:   04/08/20 0908  BP: 120/63  Pulse: 62  Resp: 18  Temp: (!) 97.3 F (36.3 C)  SpO2: 99%   Wt Readings from Last 3 Encounters:  04/08/20 223 lb (101.2 kg)  03/25/20 220 lb 10.9 oz (100.1 kg)  03/11/20 218 lb 12.8 oz (99.2 kg)   Physical Exam Vitals reviewed.  Constitutional:      Appearance: Normal appearance.  Cardiovascular:     Rate and Rhythm: Normal rate and regular rhythm.     Pulses: Normal pulses.     Heart sounds: Normal heart sounds.  Pulmonary:     Effort: Pulmonary effort is normal.     Breath sounds: Normal breath sounds.  Musculoskeletal:     Right lower leg: No edema.     Left lower leg: No edema.  Neurological:     General: No focal deficit present.     Mental Status: He is alert and oriented to person, place, and time.  Psychiatric:        Mood and Affect: Mood normal.        Behavior: Behavior normal.     LABORATORY DATA:  I have reviewed the labs as listed.  CBC Latest Ref Rng & Units 04/08/2020 03/25/2020 03/11/2020  WBC 4.0 - 10.5 K/uL 3.5(L) 2.8(L) 2.9(L)  Hemoglobin 13.0 - 17.0 g/dL 10.0(L) 9.8(L) 10.2(L)  Hematocrit 39 - 52 % 29.1(L) 29.4(L) 29.8(L)  Platelets 150 - 400 K/uL 75(L) 70(L) 66(L)   CMP Latest Ref Rng & Units 04/08/2020 03/25/2020 03/11/2020  Glucose 70 - 99 mg/dL 106(H) 91 131(H)  BUN 8 - 23 mg/dL 64(H) 65(H) 65(H)  Creatinine 0.61 - 1.24 mg/dL 6.25(H) 5.70(H) 5.93(H)  Sodium 135 - 145 mmol/L 139 138 139  Potassium 3.5 - 5.1 mmol/L 3.9 3.9 3.7  Chloride 98 - 111 mmol/L 110 109 110  CO2 22 - 32 mmol/L 20(L) 21(L) 20(L)  Calcium 8.9 - 10.3 mg/dL 8.6(L) 8.5(L) 8.8(L)  Total Protein 6.5 - 8.1 g/dL 6.1(L) 6.1(L) 6.2(L)  Total Bilirubin 0.3 - 1.2 mg/dL 0.8 0.8 1.0  Alkaline Phos 38 - 126 U/L 47 44 49  AST 15 - 41 U/L 15 15 14(L)  ALT 0 - 44 U/L 14 15 15   Lab Results  Component Value Date   LDH 115 04/08/2020   LDH 120 03/25/2020   LDH 115 03/11/2020    DIAGNOSTIC   IMAGING:  I have  independently reviewed the scans and discussed with the patient. No results found.   ASSESSMENT:  1. CLL, Rai stage IV: -BMBX on 08/10/2016 shows bone marrow involvement by CLL. -He also had bulky diffuse adenopathy with progressive thrombocytopenia. -Imbruvica 420 mg from 08/19/2016 through May 2018, discontinued due to subdural hematoma on 10/14/2016. -Subsequently offered chlorambucil and obinutuzumab. He refused obinutuzumab. Chlorambucil 30 mg every 2 weeks from June 2019 through 11/07/2018 due to severe weakness. -PET scan on 12/01/2019 showed multiple bilateral enlarged cervical lymph nodes, bilateral axillary and mediastinal adenopathy, prevascular lymph nodes, left lower paratracheal lymph node, minimal adenopathy in the retroperitoneum, external iliac lymph nodes. Spleen measures 16 cm. All lymph nodes measure less than 5 cm with SUV less than 5. 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 IGHV was mutated. -T p53 mutation is pending. -Venetoclax 10 mg started on 12/02/2019 (dose reduced secondary to Coreg), dose increased to 50 mg daily on 12/12/2019. -Dose increased to 100 mg daily on 12/19/2019, held on 12/25/2019 due to elevated creatinine. -Venetoclax 100 mg daily started back on 01/15/2020.  2. 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   PLAN:  1. CLL, Rai stage IV: -He is continuing venetoclax 100 mg daily without any problems. -CBC shows white count 3.5 with platelet count 75.  Hemoglobin is 10.  LFTs are normal. -We'll continue venetoclax at the same dose. -He will receive rituximab infusion today. -RTC 4 weeks for his next cycle.  2. CKD: -Creatinine increased to 6.5 today.  He will receive 500 mL of normal saline.  He is drinking about 100 ounces of water daily.  3. Tumor lysis prophylaxis: -Uric acid is staying in the normal after we discontinued  allopurinol.  4. Macrocytic anemia: -Hemoglobin is 10.0.  No transfusion needed.   Orders placed this encounter:  No orders of the defined types were placed in this encounter.    Sreedhar Katragadda, MD [facility] [phone]   I, Daniel Khashchuk, am acting as a scribe for Dr. Sreedhar Katagadda.  I, Sreedhar Katragadda MD, have reviewed the above documentation for accuracy and completeness, and I agree with the above.     

## 2020-04-08 NOTE — Progress Notes (Signed)
Patient was assessed by Dr. Delton Coombes and labs have been reviewed. Kidney function elevated, orders received for 500 ml Normal saline over one hour today.  Patient is okay to proceed with treatment today. Primary RN and pharmacy aware.

## 2020-04-08 NOTE — Progress Notes (Signed)
Treatment given today per MD orders. Tolerated infusion without adverse affects. Vital signs stable. No complaints at this time. Discharged from clinic ambulatory in stable condition. Alert and oriented x 3. F/U with [facility] as scheduled.   

## 2020-04-08 NOTE — Progress Notes (Signed)
Patient presents today for treatment.  Vital signs with in parameters.  No new complaints.  Labs reviewed, Creatinine 6.25 and Platelets 75.   Message received from [place] LPN/ Dr. Delton Coombes. Proceed with treatment. Labs reviewed by MD. Order received to infuse 574mls of Normal Saline over an hour for elevated creatinine level.

## 2020-04-08 NOTE — Patient Instructions (Signed)
[place] at [facility] Discharge Instructions  You were seen today by Dr. Delton Coombes. He went over your recent results. You received your treatment today. Your next appointment will be with the nurse practitioner in 4 weeks for labs and follow up.   Thank you for choosing [place] at [facility] to provide your oncology and hematology care.  To afford each patient quality time with our provider, please arrive at least 15 minutes before your scheduled appointment time.   If you have a lab appointment with the [facility] please come in thru the Main Entrance and check in at the main information desk  You need to re-schedule your appointment should you arrive 10 or more minutes late.  We strive to give you quality time with our providers, and arriving late affects you and other patients whose appointments are after yours.  Also, if you no show three or more times for appointments you may be dismissed from the clinic at the providers discretion.     Again, thank you for choosing [facility].  Our hope is that these requests will decrease the amount of time that you wait before being seen by our physicians.       _____________________________________________________________  Should you have questions after your visit to [facility], please contact our office at [phone] between the hours of 8:00 a.m. and 4:30 p.m.  Voicemails left after 4:00 p.m. will not be returned until the following business day.  For prescription refill requests, have your pharmacy contact our office and allow 72 hours.    Cancer Center Support Programs:   > Cancer Support Group  2nd Tuesday of the month 1pm-2pm, Journey Room

## 2020-04-09 DIAGNOSIS — D696 Thrombocytopenia, unspecified: Secondary | ICD-10-CM | POA: Diagnosis not present

## 2020-04-09 DIAGNOSIS — E782 Mixed hyperlipidemia: Secondary | ICD-10-CM | POA: Diagnosis not present

## 2020-04-09 DIAGNOSIS — N184 Chronic kidney disease, stage 4 (severe): Secondary | ICD-10-CM | POA: Diagnosis not present

## 2020-04-09 DIAGNOSIS — C9110 Chronic lymphocytic leukemia of B-cell type not having achieved remission: Secondary | ICD-10-CM | POA: Diagnosis not present

## 2020-04-09 DIAGNOSIS — N400 Enlarged prostate without lower urinary tract symptoms: Secondary | ICD-10-CM | POA: Diagnosis not present

## 2020-04-09 DIAGNOSIS — I129 Hypertensive chronic kidney disease with stage 1 through stage 4 chronic kidney disease, or unspecified chronic kidney disease: Secondary | ICD-10-CM | POA: Diagnosis not present

## 2020-04-09 DIAGNOSIS — I4891 Unspecified atrial fibrillation: Secondary | ICD-10-CM | POA: Diagnosis not present

## 2020-04-09 DIAGNOSIS — R7301 Impaired fasting glucose: Secondary | ICD-10-CM | POA: Diagnosis not present

## 2020-04-09 DIAGNOSIS — D649 Anemia, unspecified: Secondary | ICD-10-CM | POA: Diagnosis not present

## 2020-04-09 DIAGNOSIS — I10 Essential (primary) hypertension: Secondary | ICD-10-CM | POA: Diagnosis not present

## 2020-04-16 DIAGNOSIS — X32XXXD Exposure to sunlight, subsequent encounter: Secondary | ICD-10-CM | POA: Diagnosis not present

## 2020-04-16 DIAGNOSIS — D2262 Melanocytic nevi of left upper limb, including shoulder: Secondary | ICD-10-CM | POA: Diagnosis not present

## 2020-04-16 DIAGNOSIS — L570 Actinic keratosis: Secondary | ICD-10-CM | POA: Diagnosis not present

## 2020-04-16 DIAGNOSIS — D485 Neoplasm of uncertain behavior of skin: Secondary | ICD-10-CM | POA: Diagnosis not present

## 2020-04-16 DIAGNOSIS — L820 Inflamed seborrheic keratosis: Secondary | ICD-10-CM | POA: Diagnosis not present

## 2020-04-24 NOTE — Progress Notes (Signed)
Left a voice message for patient to come on Tues 04/27/20 at 9:25am for covid testing, as he was scheduled one day too early. Appointment rescheduled in [place].

## 2020-04-26 ENCOUNTER — Other Ambulatory Visit (HOSPITAL_COMMUNITY): Payer: Medicare HMO

## 2020-04-27 ENCOUNTER — Other Ambulatory Visit (HOSPITAL_COMMUNITY)
Admission: RE | Admit: 2020-04-27 | Discharge: 2020-04-27 | Disposition: A | Discharge status: Home / Self Care | Payer: Medicare HMO | Source: Ambulatory Visit | Attending: Surgery | Admitting: Surgery

## 2020-04-27 DIAGNOSIS — Z20822 Contact with and (suspected) exposure to covid-19: Secondary | ICD-10-CM | POA: Insufficient documentation

## 2020-04-27 DIAGNOSIS — Z01812 Encounter for preprocedural laboratory examination: Secondary | ICD-10-CM | POA: Insufficient documentation

## 2020-04-27 LAB — SARS CORONAVIRUS 2 (TAT 6-24 HRS): SARS Coronavirus 2: NEGATIVE

## 2020-04-29 ENCOUNTER — Encounter (HOSPITAL_COMMUNITY): Payer: Self-pay | Admitting: Surgery

## 2020-04-29 ENCOUNTER — Other Ambulatory Visit: Payer: Self-pay

## 2020-04-29 NOTE — Progress Notes (Signed)
[name] denies chest pain,palpations or shortness of breath. Patient tested negative for Covid on 05/27/20 and has been in quarantine with his wife. Mr.  Andrew [name] reports that he has a cold and spoke to someone at the hospital and was told is he is feeling worse to call surgeon.  I asked anesthesia PA-C to review chart. [name] has CLL, platelets were 75 in November.

## 2020-04-29 NOTE — Anesthesia Preprocedure Evaluation (Addendum)
Anesthesia Evaluation  Patient identified by MRN, date of birth, ID band Patient awake    Reviewed: Allergy & Precautions, NPO status , Patient's Chart, lab work & pertinent test results  History of Anesthesia Complications Negative for: history of anesthetic complications  Airway Mallampati: II  TM Distance: >3 FB Neck ROM: Full    Dental  (+) Dental Advisory Given   Pulmonary neg pulmonary ROS, neg shortness of breath, neg sleep apnea, neg COPD, neg recent URI, former smoker,  Covid-19 Nucleic Acid Test Results Lab Results      Component                Value               Date                      SARSCOV2NAA              NEGATIVE            04/27/2020                SARSCOV2NAA              Detected (A)        10/26/2019              breath sounds clear to auscultation       Cardiovascular hypertension, Pt. on medications + dysrhythmias Atrial Fibrillation  Rhythm:Irregular  Left ventricle: The cavity size was normal. There was mild  concentric hypertrophy. Systolic function was normal. The  estimated ejection fraction was in the range of 55% to 60%. Wall  motion was normal; there were no regional wall motion  abnormalities.  - Aortic valve: There was trivial regurgitation.  - Left atrium: The atrium was mildly dilated.    Neuro/Psych negative neurological ROS  negative psych ROS   GI/Hepatic negative GI ROS, Neg liver ROS,   Endo/Other  negative endocrine ROSNo results found for: HGBA1C   Renal/GU CRFRenal disease     Musculoskeletal  (+) Arthritis ,   Abdominal   Peds  Hematology  (+) Blood dyscrasia, anemia , Lab Results      Component                Value               Date                      WBC                      3.5 (L)             04/08/2020                HGB                      10.5 (L)            04/30/2020                HCT                      31.0 (L)            04/30/2020                 MCV  100.3 (H)           04/08/2020                PLT                      75 (L)              04/08/2020              Anesthesia Other Findings PAT note by Karoline Caldwell, PA-C: Follows with cardiology for history of paroxysmal A. fib not on anticoagulation due to history of chronic normocytic anemia and prior SDH, HTN, mild aortic aneurysm-stable, history of cardiomyopathy with subsequent normalization of EF (EF 55 to 60% by echo May 2018).  Last seen by Dr. Harl Bowie 11/20/2019, stable at that time, recommended 44-monthfollow-up.  CLL, chronic thrombocytopenia, chronic anemia followed by oncologist Dr. KDelton Coombes Currently undergoing treatment with Venetoclax daily and rituximab monthly.  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 CMarval Regal Kidney biopsy on 12/19/2017 shows CLL involvement with diffuse severe interstitial nephritis and associated tubular centric granulomatous reaction.  Will need DOS labs and eval.  EKG 11/20/19: Atrial fib. Rate 69.  TTE 10/14/2016: - Left ventricle: The cavity size was normal. There was mild  concentric hypertrophy. Systolic function was normal. The  estimated ejection fraction was in the range of 55% to 60%. Wall  motion was normal; there were no regional wall motion  abnormalities.  - Aortic valve: There was trivial regurgitation.  - Left atrium: The atrium was mildly dilated.    Reproductive/Obstetrics                            Anesthesia Physical Anesthesia Plan  ASA: III  Anesthesia Plan: MAC   Post-op Pain Management:    Induction: Intravenous  PONV Risk Score and Plan: 1 and Propofol infusion and Treatment may vary due to age or medical condition  Airway Management Planned: Nasal Cannula  Additional Equipment: None  Intra-op Plan:   Post-operative Plan:   Informed Consent: I have reviewed the patients History and Physical, chart, labs and discussed the procedure including the  risks, benefits and alternatives for the proposed anesthesia with the patient or authorized representative who has indicated his/her understanding and acceptance.     Dental advisory given  Plan Discussed with: Surgeon  Anesthesia Plan Comments:        Anesthesia Quick Evaluation

## 2020-04-29 NOTE — Progress Notes (Signed)
Anesthesia Chart Review: Same day workup  Follows with cardiology for history of paroxysmal A. fib not on anticoagulation due to history of chronic normocytic anemia and prior SDH, HTN, mild aortic aneurysm-stable, history of cardiomyopathy with subsequent normalization of EF (EF 55 to 60% by echo May 2018).  Last seen by Dr. Harl Bowie 11/20/2019, stable at that time, recommended 41-month follow-up.  CLL, chronic thrombocytopenia, chronic anemia followed by oncologist Dr. Delton Coombes. Currently undergoing treatment with Venetoclax daily and rituximab monthly.  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 Marval Regal. Kidney biopsy on 12/19/2017 shows CLL involvement with diffuse severe interstitial nephritis and associated tubular centric granulomatous reaction.  Will need DOS labs and eval.  EKG 11/20/19: Atrial fib. Rate 69.  TTE 10/14/2016: - Left ventricle: The cavity size was normal. There was mild  concentric hypertrophy. Systolic function was normal. The  estimated ejection fraction was in the range of 55% to 60%. Wall  motion was normal; there were no regional wall motion  abnormalities.  - Aortic valve: There was trivial regurgitation.  - Left atrium: The atrium was mildly dilated.    [name] Short Stay Center/Anesthesiology Phone [phone] 04/29/2020 3:55 PM

## 2020-04-30 ENCOUNTER — Ambulatory Visit (HOSPITAL_COMMUNITY): Payer: Medicare HMO | Admitting: Physician Assistant

## 2020-04-30 ENCOUNTER — Encounter (HOSPITAL_COMMUNITY): Payer: Self-pay | Admitting: Surgery

## 2020-04-30 ENCOUNTER — Ambulatory Visit (HOSPITAL_COMMUNITY)
Admission: RE | Admit: 2020-04-30 | Discharge: 2020-04-30 | Disposition: A | Discharge status: Home / Self Care | Payer: Medicare HMO | Attending: Surgery | Admitting: Surgery

## 2020-04-30 ENCOUNTER — Encounter (HOSPITAL_COMMUNITY)
Admission: RE | Disposition: A | Discharge status: Home / Self Care | Payer: Self-pay | Source: Home / Self Care | Attending: Surgery

## 2020-04-30 ENCOUNTER — Other Ambulatory Visit: Payer: Self-pay

## 2020-04-30 DIAGNOSIS — E7800 Pure hypercholesterolemia, unspecified: Secondary | ICD-10-CM | POA: Diagnosis not present

## 2020-04-30 DIAGNOSIS — I120 Hypertensive chronic kidney disease with stage 5 chronic kidney disease or end stage renal disease: Secondary | ICD-10-CM | POA: Diagnosis not present

## 2020-04-30 DIAGNOSIS — N179 Acute kidney failure, unspecified: Secondary | ICD-10-CM | POA: Diagnosis not present

## 2020-04-30 DIAGNOSIS — N185 Chronic kidney disease, stage 5: Secondary | ICD-10-CM | POA: Insufficient documentation

## 2020-04-30 DIAGNOSIS — Z87891 Personal history of nicotine dependence: Secondary | ICD-10-CM | POA: Diagnosis not present

## 2020-04-30 DIAGNOSIS — I719 Aortic aneurysm of unspecified site, without rupture: Secondary | ICD-10-CM | POA: Diagnosis not present

## 2020-04-30 DIAGNOSIS — I480 Paroxysmal atrial fibrillation: Secondary | ICD-10-CM | POA: Diagnosis not present

## 2020-04-30 DIAGNOSIS — C9110 Chronic lymphocytic leukemia of B-cell type not having achieved remission: Secondary | ICD-10-CM | POA: Insufficient documentation

## 2020-04-30 DIAGNOSIS — Z79899 Other long term (current) drug therapy: Secondary | ICD-10-CM | POA: Diagnosis not present

## 2020-04-30 DIAGNOSIS — I429 Cardiomyopathy, unspecified: Secondary | ICD-10-CM | POA: Diagnosis not present

## 2020-04-30 DIAGNOSIS — D649 Anemia, unspecified: Secondary | ICD-10-CM | POA: Diagnosis not present

## 2020-04-30 DIAGNOSIS — I4891 Unspecified atrial fibrillation: Secondary | ICD-10-CM | POA: Diagnosis not present

## 2020-04-30 HISTORY — DX: Other complications of anesthesia, initial encounter: T88.59XA

## 2020-04-30 HISTORY — DX: Personal history of urinary calculi: Z87.442

## 2020-04-30 HISTORY — PX: AV FISTULA PLACEMENT: SHX1204

## 2020-04-30 HISTORY — DX: Unspecified osteoarthritis, unspecified site: M19.90

## 2020-04-30 HISTORY — DX: Pneumonia, unspecified organism: J18.9

## 2020-04-30 HISTORY — DX: Aneurysm of the ascending aorta, without rupture: I71.21

## 2020-04-30 HISTORY — DX: Thoracic aortic aneurysm, without rupture: I71.2

## 2020-04-30 LAB — POCT I-STAT, CHEM 8
BUN: 75 mg/dL — ABNORMAL HIGH (ref 8–23)
Calcium, Ion: 1.22 mmol/L (ref 1.15–1.40)
Chloride: 113 mmol/L — ABNORMAL HIGH (ref 98–111)
Creatinine, Ser: 6.3 mg/dL — ABNORMAL HIGH (ref 0.61–1.24)
Glucose, Bld: 99 mg/dL (ref 70–99)
HCT: 31 % — ABNORMAL LOW (ref 39.0–52.0)
Hemoglobin: 10.5 g/dL — ABNORMAL LOW (ref 13.0–17.0)
Potassium: 3.8 mmol/L (ref 3.5–5.1)
Sodium: 142 mmol/L (ref 135–145)
TCO2: 18 mmol/L — ABNORMAL LOW (ref 22–32)

## 2020-04-30 LAB — SURGICAL PCR SCREEN
MRSA, PCR: POSITIVE — AB
Staphylococcus aureus: POSITIVE — AB

## 2020-04-30 SURGERY — ARTERIOVENOUS (AV) FISTULA CREATION
Anesthesia: Monitor Anesthesia Care | Site: Arm Upper | Laterality: Left

## 2020-04-30 MED ORDER — FENTANYL CITRATE (PF) 250 MCG/5ML IJ SOLN
INTRAMUSCULAR | Status: DC | PRN
  Administered 2020-04-30: 50 ug via INTRAVENOUS

## 2020-04-30 MED ORDER — PROPOFOL 10 MG/ML IV BOLUS
INTRAVENOUS | Status: DC | PRN
  Administered 2020-04-30: 20 mg via INTRAVENOUS

## 2020-04-30 MED ORDER — HYDROCODONE-ACETAMINOPHEN 5-325 MG PO TABS: 1.0000 | ORAL_TABLET | ORAL | 0 refills | Status: DC | PRN

## 2020-04-30 MED ORDER — 0.9 % SODIUM CHLORIDE (POUR BTL) OPTIME
TOPICAL | Status: DC | PRN
  Administered 2020-04-30: 1000 mL

## 2020-04-30 MED ORDER — CEFAZOLIN SODIUM-DEXTROSE 2-4 GM/100ML-% IV SOLN
2.0000 g | INTRAVENOUS | Status: AC
  Administered 2020-04-30: 2 g via INTRAVENOUS
  Filled 2020-04-30: qty 100

## 2020-04-30 MED ORDER — SODIUM CHLORIDE 0.9 % IV SOLN
INTRAVENOUS | Status: DC | PRN
  Administered 2020-04-30: 500 mL

## 2020-04-30 MED ORDER — ORAL CARE MOUTH RINSE: 15.0000 mL | Freq: Once | OROMUCOSAL | Status: AC

## 2020-04-30 MED ORDER — PHENYLEPHRINE HCL-NACL 10-0.9 MG/250ML-% IV SOLN
INTRAVENOUS | Status: DC | PRN
  Administered 2020-04-30: 50 ug/min via INTRAVENOUS

## 2020-04-30 MED ORDER — PROPOFOL 500 MG/50ML IV EMUL
INTRAVENOUS | Status: DC | PRN
  Administered 2020-04-30: 100 ug/kg/min via INTRAVENOUS

## 2020-04-30 MED ORDER — CARVEDILOL 3.125 MG PO TABS
ORAL_TABLET | ORAL | Status: AC
  Administered 2020-04-30: 6.25 mg via ORAL
  Filled 2020-04-30: qty 2

## 2020-04-30 MED ORDER — LIDOCAINE-EPINEPHRINE (PF) 1 %-1:200000 IJ SOLN
INTRAMUSCULAR | Status: DC | PRN
  Administered 2020-04-30: 10 mL via INTRADERMAL

## 2020-04-30 MED ORDER — CHLORHEXIDINE GLUCONATE 0.12 % MT SOLN: 15.0000 mL | Freq: Once | OROMUCOSAL | Status: AC

## 2020-04-30 MED ORDER — SODIUM CHLORIDE 0.9 % IV SOLN
INTRAVENOUS | Status: AC
  Filled 2020-04-30: qty 1.2

## 2020-04-30 MED ORDER — PHENYLEPHRINE 40 MCG/ML (10ML) SYRINGE FOR IV PUSH (FOR BLOOD PRESSURE SUPPORT)
PREFILLED_SYRINGE | INTRAVENOUS | Status: DC | PRN
  Administered 2020-04-30: 80 ug via INTRAVENOUS

## 2020-04-30 MED ORDER — SODIUM CHLORIDE 0.9 % IV SOLN: INTRAVENOUS | Status: DC

## 2020-04-30 MED ORDER — LIDOCAINE 2% (20 MG/ML) 5 ML SYRINGE
INTRAMUSCULAR | Status: DC | PRN
  Administered 2020-04-30: 40 mg via INTRAVENOUS

## 2020-04-30 MED ORDER — FENTANYL CITRATE (PF) 250 MCG/5ML IJ SOLN
INTRAMUSCULAR | Status: AC
  Filled 2020-04-30: qty 5

## 2020-04-30 MED ORDER — ONDANSETRON HCL 4 MG/2ML IJ SOLN
INTRAMUSCULAR | Status: DC | PRN
  Administered 2020-04-30: 4 mg via INTRAVENOUS

## 2020-04-30 MED ORDER — EPHEDRINE SULFATE-NACL 50-0.9 MG/10ML-% IV SOSY
PREFILLED_SYRINGE | INTRAVENOUS | Status: DC | PRN
  Administered 2020-04-30 (×2): 10 mg via INTRAVENOUS

## 2020-04-30 MED ORDER — CARVEDILOL 3.125 MG PO TABS: 6.2500 mg | ORAL_TABLET | Freq: Once | ORAL | Status: AC

## 2020-04-30 MED ORDER — CHLORHEXIDINE GLUCONATE 4 % EX LIQD: 60.0000 mL | Freq: Once | CUTANEOUS | Status: DC

## 2020-04-30 MED ORDER — PROPOFOL 10 MG/ML IV BOLUS
INTRAVENOUS | Status: AC
  Filled 2020-04-30: qty 20

## 2020-04-30 MED ORDER — HEPARIN SODIUM (PORCINE) 1000 UNIT/ML IJ SOLN
INTRAMUSCULAR | Status: AC
  Filled 2020-04-30: qty 3

## 2020-04-30 MED ORDER — LIDOCAINE-EPINEPHRINE (PF) 1 %-1:200000 IJ SOLN
INTRAMUSCULAR | Status: AC
  Filled 2020-04-30: qty 30

## 2020-04-30 MED ORDER — CHLORHEXIDINE GLUCONATE 0.12 % MT SOLN
OROMUCOSAL | Status: AC
  Administered 2020-04-30: 15 mL via OROMUCOSAL
  Filled 2020-04-30: qty 15

## 2020-04-30 SURGICAL SUPPLY — 33 items
ARMBAND PINK RESTRICT EXTREMIT (MISCELLANEOUS) ×4 IMPLANT
CANISTER SUCT 3000ML PPV (MISCELLANEOUS) ×2 IMPLANT
CLIP VESOCCLUDE MED 6/CT (CLIP) ×2 IMPLANT
CLIP VESOCCLUDE SM WIDE 6/CT (CLIP) ×2 IMPLANT
COVER PROBE W GEL 5X96 (DRAPES) ×2 IMPLANT
COVER WAND RF STERILE (DRAPES) ×2 IMPLANT
DERMABOND ADVANCED (GAUZE/BANDAGES/DRESSINGS) ×1
DERMABOND ADVANCED .7 DNX12 (GAUZE/BANDAGES/DRESSINGS) ×1 IMPLANT
ELECT REM PT RETURN 9FT ADLT (ELECTROSURGICAL) ×2
ELECTRODE REM PT RTRN 9FT ADLT (ELECTROSURGICAL) ×1 IMPLANT
GLOVE BIOGEL PI IND STRL 7.5 (GLOVE) ×1 IMPLANT
GLOVE BIOGEL PI INDICATOR 7.5 (GLOVE) ×1
GLOVE SURG SS PI 7.5 STRL IVOR (GLOVE) ×2 IMPLANT
GOWN STRL REUS W/ TWL LRG LVL3 (GOWN DISPOSABLE) ×2 IMPLANT
GOWN STRL REUS W/ TWL XL LVL3 (GOWN DISPOSABLE) ×1 IMPLANT
GOWN STRL REUS W/TWL LRG LVL3 (GOWN DISPOSABLE) ×2
GOWN STRL REUS W/TWL XL LVL3 (GOWN DISPOSABLE) ×1
HEMOSTAT SNOW SURGICEL 2X4 (HEMOSTASIS) IMPLANT
KIT BASIN OR (CUSTOM PROCEDURE TRAY) ×2 IMPLANT
KIT TURNOVER KIT B (KITS) ×2 IMPLANT
LOOP VESSEL MINI RED (MISCELLANEOUS) ×2 IMPLANT
NS IRRIG 1000ML POUR BTL (IV SOLUTION) ×2 IMPLANT
PACK CV ACCESS (CUSTOM PROCEDURE TRAY) ×2 IMPLANT
PAD ARMBOARD 7.5X6 YLW CONV (MISCELLANEOUS) ×4 IMPLANT
SUT PROLENE 6 0 BV (SUTURE) ×2 IMPLANT
SUT PROLENE 6 0 CC (SUTURE) ×4 IMPLANT
SUT VIC AB 3-0 SH 27 (SUTURE) ×1
SUT VIC AB 3-0 SH 27X BRD (SUTURE) ×1 IMPLANT
SUT VICRYL 4-0 PS2 18IN ABS (SUTURE) ×2 IMPLANT
SYR BULB IRRIG 60ML STRL (SYRINGE) ×2 IMPLANT
TOWEL GREEN STERILE (TOWEL DISPOSABLE) ×2 IMPLANT
UNDERPAD 30X36 HEAVY ABSORB (UNDERPADS AND DIAPERS) ×2 IMPLANT
WATER STERILE IRR 1000ML POUR (IV SOLUTION) ×2 IMPLANT

## 2020-04-30 NOTE — H&P (Signed)
[facility] [place]  Patient name: Andrew [name]  MRN: [id number]        DOB: [birth year]        Sex: male   REQUESTING PROVIDER:    Dr. Marval Regal   REASON FOR CONSULT:    Renal disease, dialysis  HISTORY OF PRESENT ILLNESS:   Andrew [name] is a 79 y.o. male, who is referred for dialysis access.  His renal failure secondary to hypertension and CLL.  He is right-handed.  Patient has a history of atrial fibrillation.  He has undergone ablation and is not on anticoagulation.  He is medically managed for hypercholesterolemia and hypertension.  He is a former smoker.  PAST MEDICAL HISTORY        Past Medical History:  Diagnosis Date  . CKD (chronic kidney disease), stage IV ([place])   . CLL (chronic lymphocytic leukemia) ([place])   . Hypertension   . NICM (nonischemic cardiomyopathy) ([place])    a. cath 2007, EF 35%, minimal CAD. b. EF normal 2018.  Marland Kitchen PAF (paroxysmal atrial fibrillation) ([place])   . Pinched nerve in neck   . SDH (subdural hematoma) ([place])   . Thrombocytopenia ([place])      FAMILY HISTORY        Family History  Problem Relation Age of Onset  . CAD Father 70  . Cancer Mother   . Alzheimer's disease Mother   . Colon cancer Neg Hx     SOCIAL HISTORY:   Social History        Socioeconomic History  . Marital status: Married    Spouse name: Not on file  . Number of children: Not on file  . Years of education: Not on file  . Highest education level: Not on file  Occupational History  . Not on file  Tobacco Use  . Smoking status: Former Smoker    Packs/day: 1.00    Years: 15.00    Pack years: 15.00    Quit date: 01/26/1971    Years since quitting: 49.1  . Smokeless tobacco: Never Used  Vaping Use  . Vaping Use: Never used  Substance and Sexual Activity  . Alcohol use: No  . Drug use: No  . Sexual activity: Not on file  Other Topics Concern  . Not on file  Social  History Narrative  . Not on file   Social Determinants of Health      Financial Resource Strain:   . Difficulty of Paying Living Expenses: Not on file  Food Insecurity:   . Worried About Charity fundraiser in the Last Year: Not on file  . Ran Out of Food in the Last Year: Not on file  Transportation Needs:   . Lack of Transportation (Medical): Not on file  . Lack of Transportation (Non-Medical): Not on file  Physical Activity:   . Days of Exercise per Week: Not on file  . Minutes of Exercise per Session: Not on file  Stress:   . Feeling of Stress : Not on file  Social Connections:   . Frequency of Communication with Friends and Family: Not on file  . Frequency of Social Gatherings with Friends and Family: Not on file  . Attends Religious Services: Not on file  . Active Member of Clubs or Organizations: Not on file  . Attends Archivist Meetings: Not on file  . Marital Status: Not on file  Intimate Partner Violence:   . Fear of Current or Ex-Partner:  Not on file  . Emotionally Abused: Not on file  . Physically Abused: Not on file  . Sexually Abused: Not on file    ALLERGIES:    No Known Allergies  CURRENT MEDICATIONS:          Current Outpatient Medications  Medication Sig Dispense Refill  . carvedilol (COREG) 6.25 MG tablet Take 1 tablet (6.25 mg total) by mouth 2 (two) times daily. 180 tablet 3  . Cyanocobalamin (VITAMIN B12) 1000 MCG TBCR Take 1,000 mcg by mouth daily.     . Multiple Vitamin (MULITIVITAMIN WITH MINERALS) TABS Take 1 tablet by mouth daily.    Marland Kitchen omeprazole (PRILOSEC) 20 MG capsule 1 PO 30 MINS PRIOR TO BREAKFAST. 90 capsule 3  . sodium bicarbonate 325 MG tablet Take 325 mg by mouth 3 (three) times daily.    . tamsulosin (FLOMAX) 0.4 MG CAPS capsule Take 0.4 mg by mouth daily.     . VENCLEXTA 100 MG TABS TAKE 2 TABLETS (200 MG TOTAL) BY MOUTH DAILY (Patient taking differently: Take 100 mg by mouth daily. ) 60 tablet 0  .  allopurinol (ZYLOPRIM) 300 MG tablet Take 1 tablet (300 mg total) by mouth daily. 30 tablet 1  . hydrALAZINE (APRESOLINE) 25 MG tablet SMARTSIG:1 Tablet(s) By Mouth Morning-Evening (Patient not taking: Reported on 03/01/2020)    . prochlorperazine (COMPAZINE) 10 MG tablet Take 1 tablet (10 mg total) by mouth every 6 (six) hours as needed for nausea or vomiting. (Patient not taking: Reported on 12/26/2019) 45 tablet 2   No current facility-administered medications for this visit.    REVIEW OF SYSTEMS:   [X]  denotes positive finding, [ ]  denotes negative finding Cardiac  Comments:  Chest pain or chest pressure:    Shortness of breath upon exertion:    Short of breath when lying flat:    Irregular heart rhythm:        Vascular    Pain in calf, thigh, or hip brought on by ambulation:    Pain in feet at night that wakes you up from your sleep:     Blood clot in your veins:    Leg swelling:         Pulmonary    Oxygen at home:    Productive cough:     Wheezing:         Neurologic    Sudden weakness in arms or legs:     Sudden numbness in arms or legs:     Sudden onset of difficulty speaking or slurred speech:    Temporary loss of vision in one eye:     Problems with dizziness:         Gastrointestinal    Blood in stool:      Vomited blood:         Genitourinary    Burning when urinating:     Blood in urine:        Psychiatric    Major depression:         Hematologic    Bleeding problems:    Problems with blood clotting too easily:        Skin    Rashes or ulcers:        Constitutional    Fever or chills:     PHYSICAL EXAM:      Vitals:   03/01/20 0853  BP: (!) 154/93  Pulse: 72  Resp: 18  Temp: 97.8 F (36.6 C)  TempSrc: Temporal  SpO2: 97%  Weight: 217 lb (98.4 kg)  Height: 6\' 2"  (1.88 m)    GENERAL: The patient is a well-nourished male, in no  acute distress. The vital signs are documented above. CARDIAC: There is a regular rate and rhythm.  VASCULAR: Palpable radial and brachial pulse on the left PULMONARY: Nonlabored respirations ABDOMEN: Soft and non-tender with normal pitched bowel sounds.  MUSCULOSKELETAL: There are no major deformities or cyanosis. NEUROLOGIC: No focal weakness or paresthesias are detected. SKIN: There are no ulcers or rashes noted. PSYCHIATRIC: The patient has a normal affect.  STUDIES:   I have reviewed the following studies: +-----------------+-------------+----------+---------+  Right Cephalic  Diameter (cm)Depth (cm)Findings   +-----------------+-------------+----------+---------+  Shoulder       0.63               +-----------------+-------------+----------+---------+  Prox upper arm    0.52        branching  +-----------------+-------------+----------+---------+  Mid upper arm    0.56               +-----------------+-------------+----------+---------+  Dist upper arm    0.64        branching  +-----------------+-------------+----------+---------+  Antecubital fossa 0.58 / 0.67             +-----------------+-------------+----------+---------+  Prox forearm    0.53 / 0.41             +-----------------+-------------+----------+---------+  Mid forearm     0.18        branching  +-----------------+-------------+----------+---------+  Dist forearm     0.16               +-----------------+-------------+----------+---------+   +-----------------+-------------+----------+---------+  Right Basilic  Diameter (cm)Depth (cm)Findings   +-----------------+-------------+----------+---------+  Mid upper arm    0.41               +-----------------+-------------+----------+---------+  Dist upper arm    0.38                +-----------------+-------------+----------+---------+  Antecubital fossa  0.45        branching  +-----------------+-------------+----------+---------+  Prox forearm    0.32 / 0.28             +-----------------+-------------+----------+---------+   +-----------------+------------------+----------+---------+  Left Cephalic   Diameter (cm)  Depth (cm)Findings   +-----------------+------------------+----------+---------+  Shoulder         0.46                +-----------------+------------------+----------+---------+  Prox upper arm      0.43                +-----------------+------------------+----------+---------+  Mid upper arm      0.33                +-----------------+------------------+----------+---------+  Dist upper arm  0.48 / 0.57 / 0.39      valves   +-----------------+------------------+----------+---------+  Antecubital fossa  0.43 / 0.39       branching  +-----------------+------------------+----------+---------+  Prox forearm     0.56 / 0.40        joins   +-----------------+------------------+----------+---------+  Mid forearm       0.37                +-----------------+------------------+----------+---------+  Dist forearm       0.38                +-----------------+------------------+----------+---------+   +-----------------+------------------+----------+---------+  Left Basilic    Diameter (cm)  Depth (cm)Findings   +-----------------+------------------+----------+---------+  Prox upper  arm      0.65                +-----------------+------------------+----------+---------+  Mid upper arm      0.42                +-----------------+------------------+----------+---------+  Dist upper arm  0.48 / 0.80 / 0.44      branching  +-----------------+------------------+----------+---------+  Antecubital fossa    0.35                +-----------------+------------------+----------+---------+  Prox forearm       0.34          joins   +-----------------+------------------+----------+---------+   ASSESSMENT and PLAN   Stage V chronic renal insufficiency: I discussed proceeding with a left brachiocephalic fistula.  I discussed the details of the operation as well as the risks and benefits including the risk of steal syndrome, nonmaturity, and the need for future interventions.  All of his questions were answered.  He is scheduled to see Dr. Marval Regal in 3 weeks.  He would like to further discuss this with him before scheduling his operation.   Leia Alf, MD, FACS [facility] [phone] Pager [phone]   No new issues Plan for left arm fistula.  All questions answered   Annamarie Major

## 2020-04-30 NOTE — Op Note (Signed)
    Patient name: Andrew [name] MRN: [id number] DOB: 07/20/1940 Sex: male  04/30/2020 Pre-operative Diagnosis: CKD Post-operative diagnosis:  Same Surgeon:  Annamarie Major Assistants: Marlinda Mike Procedure:   Left brachiocephalic fistula Anesthesia: MAC Blood Loss: Minimal Specimens: None  Findings: Healthy 4 mm artery.  Vein was approximately 4 cm of the antecubital crease and tapered slightly upper arm however it was still greater than 3 mm  Indications: The patient comes in today for dialysis access.  Procedure:  The patient was identified in the holding area and taken to [address] [address]  The patient was then placed supine on the table. MAC anesthesia was administered.  The patient was prepped and draped in the usual sterile fashion.  A time out was called and antibiotics were administered.  A PA was necessary to expedite the procedure and assist with the technical details.  Ultrasound was used to evaluate the cephalic vein in the upper arm.  This appeared to be adequate for fistula creation beginning in the antecubital crease.  1% lidocaine was used for local anesthesia.  A transverse incision was made just proximal to the antecubital crease.  I first dissected out the brachial artery.  This was a 4 mm disease-free artery.  I then dissected out the cephalic vein throughout the width of the incision.  Side branches were divided between silk ties.  The vein measured approximately 4 mm at this level.  It did taper to a 3 mm vein as it went up the arm.  It was marked for orientation and then ligated distally.  The vein distended nicely with heparin saline.  The brachial artery was then occluded with vascular clamps.  A #11 blade was used to make an arteriotomy which was extended longitudinally with Potts scissors.  The vein was cut the appropriate length and spatulated to fit the size of the arteriotomy.  A running anastomosis was created with 6-0 Prolene.  Just prior to completion the appropriate  flushing maneuvers were performed and the anastomosis was completed.  The patient had a palpable left radial pulse after the procedure and a good thrill within the fistula.  I inspected the course of the cephalic vein to make sure there were no kinks.  I used the median cubital vein for the anastomosis and therefore I was able to ligate the distal cephalic vein.  After hemostasis was achieved, the incision was closed with 2 layers with 3-0 Vicryl followed by Dermabond.  There were no immediate complications.   Disposition: To PACU stable.   Theotis Burrow, M.D., [facility] [place] Office: [phone] Pager:  [phone]

## 2020-04-30 NOTE — Transfer of Care (Signed)
Immediate Anesthesia Transfer of Care Note  Patient: Andrew [name]  Procedure(s) Performed: LEFT ARM BRACHIOCEPHALIC ARTERIOVENOUS (AV) FISTULA CREATION (Left Arm Upper)  Patient Location: PACU  Anesthesia Type:MAC  Level of Consciousness: awake, alert  and oriented  Airway & Oxygen Therapy: Patient Spontanous Breathing  Post-op Assessment: Report given to RN and Post -op Vital signs reviewed and stable  Post vital signs: Reviewed and stable  Last Vitals:  Vitals Value Taken Time  BP 132/68 04/30/20 1558  Temp    Pulse 66 04/30/20 1602  Resp 16 04/30/20 1602  SpO2 97 % 04/30/20 1602  Vitals shown include unvalidated device data.  Last Pain:  Vitals:   04/30/20 1123  TempSrc:   PainSc: 0-No pain      Patients Stated Pain Goal: 4 (22/97/98 9211)  Complications: No complications documented.

## 2020-04-30 NOTE — Discharge Instructions (Signed)
° °  [facility] [place] ° °Discharge Instructions ° °AV Fistula or Graft Surgery for Dialysis Access ° °Please refer to the following instructions for your post-procedure care. Your surgeon or physician assistant will discuss any changes with you. ° °Activity ° °You may drive the day following your surgery, if you are comfortable and no longer taking prescription pain medication. Resume full activity as the soreness in your incision resolves. ° °Bathing/Showering ° °You may shower after you go home. Keep your incision dry for 48 hours. Do not soak in a bathtub, hot tub, or swim until the incision heals completely. You may not shower if you have a hemodialysis catheter. ° °Incision Care ° °Clean your incision with mild soap and water after 48 hours. Pat the area dry with a clean towel. You do not need a bandage unless otherwise instructed. Do not apply any ointments or creams to your incision. You may have skin glue on your incision. Do not peel it off. It will come off on its own in about one week. Your arm may swell a bit after surgery. To reduce swelling use pillows to elevate your arm so it is above your heart. Your doctor will tell you if you need to lightly wrap your arm with an ACE bandage. ° °Diet ° °Resume your normal diet. There are not special food restrictions following this procedure. In order to heal from your surgery, it is CRITICAL to get adequate nutrition. Your body requires vitamins, minerals, and protein. Vegetables are the best source of vitamins and minerals. Vegetables also provide the perfect balance of protein. Processed food has little nutritional value, so try to avoid this. ° °Medications ° °Resume taking all of your medications. If your incision is causing pain, you may take over-the counter pain relievers such as acetaminophen (Tylenol). If you were prescribed a stronger pain medication, please be aware these medications can cause nausea and constipation. Prevent  nausea by taking the medication with a snack or meal. Avoid constipation by drinking plenty of fluids and eating foods with high amount of fiber, such as fruits, vegetables, and grains. Do not take Tylenol if you are taking prescription pain medications. ° ° ° ° °Follow up °Your surgeon may want to see you in the office following your access surgery. If so, this will be arranged at the time of your surgery. ° °Please call us immediately for any of the following conditions: ° °Increased pain, redness, drainage (pus) from your incision site °Fever of 101 degrees or higher °Severe or worsening pain at your incision site °Hand pain or numbness. ° °Reduce your risk of vascular disease: ° °Stop smoking. If you would like help, call [facility] at 1-800-QUIT-NOW (1-800-784-8669) or [place] at 336-586-4000 ° °Manage your cholesterol °Maintain a desired weight °Control your diabetes °Keep your blood pressure down ° °Dialysis ° °It will take several weeks to several months for your new dialysis access to be ready for use. Your surgeon will determine when it is OK to use it. Your nephrologist will continue to direct your dialysis. You can continue to use your Permcath until your new access is ready for use. ° °If you have any questions, please call the office at [phone]. ° °

## 2020-05-03 ENCOUNTER — Other Ambulatory Visit (HOSPITAL_COMMUNITY): Payer: Self-pay | Admitting: Hematology

## 2020-05-03 ENCOUNTER — Ambulatory Visit: Payer: Medicare HMO | Admitting: Cardiology

## 2020-05-03 ENCOUNTER — Encounter (HOSPITAL_COMMUNITY): Payer: Self-pay | Admitting: Surgery

## 2020-05-03 NOTE — Anesthesia Postprocedure Evaluation (Signed)
Anesthesia Post Note  Patient: Andrew [name]  Procedure(s) Performed: LEFT ARM BRACHIOCEPHALIC ARTERIOVENOUS (AV) FISTULA CREATION (Left Arm Upper)     Patient location during evaluation: PACU Anesthesia Type: MAC Level of consciousness: awake and alert Pain management: pain level controlled Vital Signs Assessment: post-procedure vital signs reviewed and stable Respiratory status: spontaneous breathing, nonlabored ventilation, respiratory function stable and patient connected to nasal cannula oxygen Cardiovascular status: stable and blood pressure returned to baseline Postop Assessment: no apparent nausea or vomiting Anesthetic complications: no   No complications documented.  Last Vitals:  Vitals:   04/30/20 1615 04/30/20 1630  BP: (!) 141/89 136/86  Pulse: 65 64  Resp: 18 15  Temp:  (!) 36.1 C  SpO2: 99% 100%    Last Pain:  Vitals:   04/30/20 1630  TempSrc:   PainSc: 0-No pain                 Andrew [name]

## 2020-05-05 ENCOUNTER — Other Ambulatory Visit (HOSPITAL_COMMUNITY): Payer: Self-pay | Admitting: Hematology and Oncology

## 2020-05-06 ENCOUNTER — Inpatient Hospital Stay (HOSPITAL_BASED_OUTPATIENT_CLINIC_OR_DEPARTMENT_OTHER): Payer: Medicare HMO | Admitting: Hematology and Oncology

## 2020-05-06 ENCOUNTER — Other Ambulatory Visit: Payer: Self-pay

## 2020-05-06 ENCOUNTER — Inpatient Hospital Stay (HOSPITAL_COMMUNITY): Payer: Medicare HMO

## 2020-05-06 ENCOUNTER — Inpatient Hospital Stay (HOSPITAL_COMMUNITY): Payer: Medicare HMO | Attending: Hematology and Oncology

## 2020-05-06 VITALS — BP 127/78 | HR 70 | Temp 97.2°F | Resp 18

## 2020-05-06 DIAGNOSIS — C9110 Chronic lymphocytic leukemia of B-cell type not having achieved remission: Secondary | ICD-10-CM

## 2020-05-06 DIAGNOSIS — N189 Chronic kidney disease, unspecified: Secondary | ICD-10-CM | POA: Insufficient documentation

## 2020-05-06 DIAGNOSIS — N400 Enlarged prostate without lower urinary tract symptoms: Secondary | ICD-10-CM | POA: Insufficient documentation

## 2020-05-06 DIAGNOSIS — Z5112 Encounter for antineoplastic immunotherapy: Secondary | ICD-10-CM | POA: Insufficient documentation

## 2020-05-06 DIAGNOSIS — Z79899 Other long term (current) drug therapy: Secondary | ICD-10-CM | POA: Insufficient documentation

## 2020-05-06 DIAGNOSIS — D696 Thrombocytopenia, unspecified: Secondary | ICD-10-CM | POA: Insufficient documentation

## 2020-05-06 LAB — CBC WITH DIFFERENTIAL/PLATELET
Abs Immature Granulocytes: 0.03 10*3/uL (ref 0.00–0.07)
Basophils Absolute: 0 10*3/uL (ref 0.0–0.1)
Basophils Relative: 1 %
Eosinophils Absolute: 0.1 10*3/uL (ref 0.0–0.5)
Eosinophils Relative: 3 %
HCT: 28.7 % — ABNORMAL LOW (ref 39.0–52.0)
Hemoglobin: 9.8 g/dL — ABNORMAL LOW (ref 13.0–17.0)
Immature Granulocytes: 1 %
Lymphocytes Relative: 12 %
Lymphs Abs: 0.5 10*3/uL — ABNORMAL LOW (ref 0.7–4.0)
MCH: 34.1 pg — ABNORMAL HIGH (ref 26.0–34.0)
MCHC: 34.1 g/dL (ref 30.0–36.0)
MCV: 100 fL (ref 80.0–100.0)
Monocytes Absolute: 0.4 10*3/uL (ref 0.1–1.0)
Monocytes Relative: 10 %
Neutro Abs: 2.9 10*3/uL (ref 1.7–7.7)
Neutrophils Relative %: 73 %
Platelets: 91 10*3/uL — ABNORMAL LOW (ref 150–400)
RBC: 2.87 MIL/uL — ABNORMAL LOW (ref 4.22–5.81)
RDW: 13.4 % (ref 11.5–15.5)
WBC: 4 10*3/uL (ref 4.0–10.5)
nRBC: 0 % (ref 0.0–0.2)

## 2020-05-06 LAB — COMPREHENSIVE METABOLIC PANEL
ALT: 7 U/L (ref 0–44)
AST: 17 U/L (ref 15–41)
Albumin: 3.6 g/dL (ref 3.5–5.0)
Alkaline Phosphatase: 55 U/L (ref 38–126)
Anion gap: 8 (ref 5–15)
BUN: 61 mg/dL — ABNORMAL HIGH (ref 8–23)
CO2: 20 mmol/L — ABNORMAL LOW (ref 22–32)
Calcium: 8.5 mg/dL — ABNORMAL LOW (ref 8.9–10.3)
Chloride: 108 mmol/L (ref 98–111)
Creatinine, Ser: 5.38 mg/dL — ABNORMAL HIGH (ref 0.61–1.24)
GFR, Estimated: 10 mL/min — ABNORMAL LOW (ref >60)
Glucose, Bld: 107 mg/dL — ABNORMAL HIGH (ref 70–99)
Potassium: 3.9 mmol/L (ref 3.5–5.1)
Sodium: 136 mmol/L (ref 135–145)
Total Bilirubin: 0.8 mg/dL (ref 0.3–1.2)
Total Protein: 6.1 g/dL — ABNORMAL LOW (ref 6.5–8.1)

## 2020-05-06 LAB — LACTATE DEHYDROGENASE: LDH: 122 U/L (ref 98–192)

## 2020-05-06 LAB — URIC ACID: Uric Acid, Serum: 5.7 mg/dL (ref 3.7–8.6)

## 2020-05-06 MED ORDER — SODIUM CHLORIDE 0.9 % IV SOLN
500.0000 mg/m2 | Freq: Once | INTRAVENOUS | Status: AC
  Administered 2020-05-06: 1100 mg via INTRAVENOUS
  Filled 2020-05-06: qty 100

## 2020-05-06 MED ORDER — ACETAMINOPHEN 325 MG PO TABS
ORAL_TABLET | ORAL | Status: AC
  Filled 2020-05-06: qty 2

## 2020-05-06 MED ORDER — ACETAMINOPHEN 325 MG PO TABS
650.0000 mg | ORAL_TABLET | Freq: Once | ORAL | Status: AC
  Administered 2020-05-06: 650 mg via ORAL

## 2020-05-06 MED ORDER — DIPHENHYDRAMINE HCL 50 MG/ML IJ SOLN
INTRAMUSCULAR | Status: AC
  Filled 2020-05-06: qty 1

## 2020-05-06 MED ORDER — FAMOTIDINE IN NACL 20-0.9 MG/50ML-% IV SOLN
INTRAVENOUS | Status: AC
  Filled 2020-05-06: qty 50

## 2020-05-06 MED ORDER — DIPHENHYDRAMINE HCL 50 MG/ML IJ SOLN
50.0000 mg | Freq: Once | INTRAMUSCULAR | Status: AC
  Administered 2020-05-06: 50 mg via INTRAVENOUS

## 2020-05-06 MED ORDER — VENETOCLAX 100 MG PO TABS
100.0000 mg | ORAL_TABLET | Freq: Every day | ORAL | 0 refills | Status: DC
Start: 2020-05-06 — End: 2020-07-01

## 2020-05-06 MED ORDER — FAMOTIDINE IN NACL 20-0.9 MG/50ML-% IV SOLN
20.0000 mg | Freq: Once | INTRAVENOUS | Status: AC
  Administered 2020-05-06: 20 mg via INTRAVENOUS

## 2020-05-06 MED ORDER — SODIUM CHLORIDE 0.9 % IV SOLN: Freq: Once | INTRAVENOUS | Status: AC

## 2020-05-06 NOTE — Progress Notes (Signed)
Patient Care Team: [name], MD as PCP - General (Internal Medicine) Harl Bowie Alphonse Guild, MD as PCP - Cardiology (Cardiology) Danie Binder, MD (Inactive) as Consulting Physician (Gastroenterology)  DIAGNOSIS:  Encounter Diagnosis  Name Primary?  . CLL (chronic lymphocytic leukemia) ([place])     SUMMARY OF ONCOLOGIC HISTORY: Oncology History  CLL (chronic lymphocytic leukemia) ([place])  03/09/2015 Initial Diagnosis   CLL (chronic lymphocytic leukemia) ([place])   08/03/2016 Imaging   CT neck- 1. Diffuse and bulky lymphadenopathy in keeping with history of CLL. Nodal enlargement has diffusely progressed since April 2016. 2. Chest and abdomen CT reported separately.   08/03/2016 Imaging   CT CAP- 1. Bulky lymphadenopathy in the chest, abdomen, and pelvis as described. 2. Splenomegaly. 3. Coronary artery and thoracoabdominal aortic atherosclerosis. 4. Prostatomegaly. 5. Several tiny lucent lesions in the bony anatomic pelvis, nonspecific, but attention on follow-up imaging recommended.   08/10/2016 Procedure   Bone marrow aspiration and biopsy by IR.   08/11/2016 Pathology Results   Bone Marrow, Aspirate,Biopsy, and Clot, left iliac crest - MARKED INVOLVEMENT BY CHRONIC LYMPHOCYTIC LEUKEMIA/SMALL LYMPHOCYTIC LYMPHOMA. PERIPHERAL BLOOD: - CHRONIC LYMPHOCYTIC LEUKEMIA. - THROMBOCYTOPENIA.   08/11/2016 Pathology Results   Bone Marrow Flow Cytometry - CHRONIC LYMPHOCYTIC LEUKEMIA/SMALL LYMPHOCYTIC LYMPHOMA.   08/16/2016 Pathology Results   Normal cytogenetics.  Molecular cytogenetic analysis by [place] demonstrates a 13q-   08/19/2016 -  Chemotherapy   Imbruvica 420 mg daily    03/11/2020 -  Chemotherapy   The patient had riTUXimab-pvvr (RUXIENCE) 400 mg in sodium chloride 0.9 % 250 mL (1.3793 mg/mL) infusion, 900 mg, Intravenous,  Once, 2 of 6 cycles Dose modification: 500 mg/m2 (original dose 500 mg/m2, Cycle 2, Reason: Provider Judgment), 500 mg (original dose 500 mg, Cycle 1,  Reason: Provider Judgment, Comment: day 2) Administration: 400 mg (03/11/2020), 1,100 mg (04/08/2020), 500 mg (03/12/2020)  for chemotherapy treatment.      CHIEF COMPLIANT: CLL on Rituxan and venetoclax  INTERVAL HISTORY: Andrew [name] is a 79 year old gentleman with above-mentioned history of bulky lymphadenopathy for which he has been responding to Rituxan with venetoclax.  He has had no major side effects from the treatment.  Because of his renal failure he had a AV fistula placed on his left arm.  He denies any fevers chills night sweats or weight loss.  The bulky lymphadenopathy has gotten markedly better.   ALLERGIES:  has No Known Allergies.  MEDICATIONS:  Current Outpatient Medications  Medication Sig Dispense Refill  . amLODipine (NORVASC) 5 MG tablet Take 5 mg by mouth at bedtime.    . carvedilol (COREG) 6.25 MG tablet Take 1 tablet (6.25 mg total) by mouth 2 (two) times daily. 180 tablet 3  . cholecalciferol (VITAMIN D) 25 MCG (1000 UNIT) tablet Take 1,000 Units by mouth daily.    . Cyanocobalamin (VITAMIN B12) 1000 MCG TBCR Take 1,000 mcg by mouth daily.     . Multiple Vitamin (MULITIVITAMIN WITH MINERALS) TABS Take 1 tablet by mouth daily.    Marland Kitchen omeprazole (PRILOSEC) 20 MG capsule 1 PO 30 MINS PRIOR TO BREAKFAST. (Patient taking differently: Take 20 mg by mouth daily.) 90 capsule 3  . sodium bicarbonate 325 MG tablet Take 325 mg by mouth 3 (three) times daily.    . tamsulosin (FLOMAX) 0.4 MG CAPS capsule Take 0.4 mg by mouth daily.     Marland Kitchen venetoclax (VENCLEXTA) 100 MG tablet Take 1 tablet (100 mg total) by mouth daily. Tablets should be swallowed whole with a  meal and a full glass of water. 60 tablet 0   No current facility-administered medications for this visit.    PHYSICAL EXAMINATION: ECOG PERFORMANCE STATUS: 1 - Symptomatic but completely ambulatory  Vitals:   05/06/20 0905  BP: 116/64  Pulse: 60  Resp: 18  Temp: (!) 97.2 F (36.2 C)  SpO2: 92%   Filed  Weights   05/06/20 0905  Weight: 226 lb (102.5 kg)       LABORATORY DATA:  I have reviewed the data as listed CMP Latest Ref Rng & Units 05/06/2020 04/30/2020 04/08/2020  Glucose 70 - 99 mg/dL 107(H) 99 106(H)  BUN 8 - 23 mg/dL 61(H) 75(H) 64(H)  Creatinine 0.61 - 1.24 mg/dL 5.38(H) 6.30(H) 6.25(H)  Sodium 135 - 145 mmol/L 136 142 139  Potassium 3.5 - 5.1 mmol/L 3.9 3.8 3.9  Chloride 98 - 111 mmol/L 108 113(H) 110  CO2 22 - 32 mmol/L 20(L) - 20(L)  Calcium 8.9 - 10.3 mg/dL 8.5(L) - 8.6(L)  Total Protein 6.5 - 8.1 g/dL 6.1(L) - 6.1(L)  Total Bilirubin 0.3 - 1.2 mg/dL 0.8 - 0.8  Alkaline Phos 38 - 126 U/L 55 - 47  AST 15 - 41 U/L 17 - 15  ALT 0 - 44 U/L 7 - 14    Lab Results  Component Value Date   WBC 4.0 05/06/2020   HGB 9.8 (L) 05/06/2020   HCT 28.7 (L) 05/06/2020   MCV 100.0 05/06/2020   PLT 91 (L) 05/06/2020   NEUTROABS PENDING 05/06/2020    ASSESSMENT & PLAN:  CLL (chronic lymphocytic leukemia) ([facility]) 1. CLL, Rai stage IV: -BMBX on 08/10/2016 shows bone marrow involvement by CLL. -He also had bulky diffuse adenopathy with progressive thrombocytopenia. -Imbruvica 420 mg from 08/19/2016 through May 2018, discontinued due to subdural hematoma on 10/14/2016. -Subsequently offered chlorambucil and obinutuzumab. He refused obinutuzumab. Chlorambucil 30 mg every 2 weeks from June 2019 through 11/07/2018 due to severe weakness. -PET scan on 12/01/2019 showed multiple bilateral enlarged cervical lymph nodes, bilateral axillary and mediastinal adenopathy, prevascular lymph nodes, left lower paratracheal lymph node, minimal adenopathy in the retroperitoneum, external iliac lymph nodes. Spleen measures 16 cm. All lymph nodes measure less than 5 cm with SUV less than 5. 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 IGHV was mutated. -T p53 mutation is pending. -Venetoclax 10 mg started on 12/02/2019 (dose reduced secondary to Coreg), dose increased to 50 mg daily on 12/12/2019. -Dose  increased to 100 mg daily on 12/19/2019, held on 12/25/2019 due to elevated creatinine. -Venetoclax 100 mg daily started back on 01/15/2020.  2. 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 Patient has a fistula in place on the left arm. The hope is that as the CLL gets better his renal function also improved.   PLAN:  1. CLL, Rai stage IV: -He is continuing venetoclax 100 mg daily without any problems. -CBC shows white count 4 with platelet count 91.  Hemoglobin is 9.8.  LFTs are normal. -We'll continue venetoclax at the same dose. -He will receive rituximab infusion today.  Today is cycle 4 of Rituxan -RTC 4 weeks for his next cycle.  2. CKD: -Creatinine increased to 5.8 today.  Encouraged him to increase his oral fluid intake.  3. Tumor lysis prophylaxis: -Uric acid is staying in the normal after we discontinued allopurinol.  4. Macrocytic anemia: -Hemoglobin is 9.8.  No transfusion needed.  We discussed the role of  over-the-counter iron supplement.    No orders of the defined types were placed in this encounter.  The patient has a good understanding of the overall plan. he agrees with it. he will call with any problems that may develop before the next visit here. Total time spent: 30 mins including face to face time and time spent for planning, charting and co-ordination of care   Harriette Ohara, MD 05/06/20

## 2020-05-06 NOTE — Patient Instructions (Signed)
[facility] Discharge Instructions for Patients Receiving Chemotherapy  Today you received the following chemotherapy agents   To help prevent nausea and vomiting after your treatment, we encourage you to take your nausea medication   If you develop nausea and vomiting that is not controlled by your nausea medication, call the clinic.   BELOW ARE SYMPTOMS THAT SHOULD BE REPORTED IMMEDIATELY:  *FEVER GREATER THAN 100.5 F  *CHILLS WITH OR WITHOUT FEVER  NAUSEA AND VOMITING THAT IS NOT CONTROLLED WITH YOUR NAUSEA MEDICATION  *UNUSUAL SHORTNESS OF BREATH  *UNUSUAL BRUISING OR BLEEDING  TENDERNESS IN MOUTH AND THROAT WITH OR WITHOUT PRESENCE OF ULCERS  *URINARY PROBLEMS  *BOWEL PROBLEMS  UNUSUAL RASH Items with * indicate a potential emergency and should be followed up as soon as possible.  Feel free to call the clinic should you have any questions or concerns. The clinic phone number is [phone].  Please show the CHEMO ALERT CARD at check-in to the Emergency Department and triage nurse.   

## 2020-05-06 NOTE — Patient Instructions (Signed)
[facility] at [facility] Discharge Instructions  You were seen today by Dr. Gudena. Follow up as scheduled.  Thank you for choosing [facility] at [facility] to provide your oncology and hematology care.  To afford each patient quality time with our provider, please arrive at least 15 minutes before your scheduled appointment time.   If you have a lab appointment with the Cancer Center please come in thru the Main Entrance and check in at the main information desk.  You need to re-schedule your appointment should you arrive 10 or more minutes late.  We strive to give you quality time with our providers, and arriving late affects you and other patients whose appointments are after yours.  Also, if you no show three or more times for appointments you may be dismissed from the clinic at the providers discretion.     Again, thank you for choosing [facility].  Our hope is that these requests will decrease the amount of time that you wait before being seen by our physicians.       _____________________________________________________________  Should you have questions after your visit to [facility], please contact our office at [phone] and follow the prompts.  Our office hours are 8:00 a.m. and 4:30 p.m. Monday - Friday.  Please note that voicemails left after 4:00 p.m. may not be returned until the following business day.  We are closed weekends and major holidays.  You do have access to a nurse 24-7, just call the main number to the clinic [phone] and do not press any options, hold on the line and a nurse will answer the phone.    For prescription refill requests, have your pharmacy contact our office and allow 72 hours.    Due to Covid, you will need to wear a mask upon entering the hospital. If you do not have a mask, a mask will be given to you at the Main Entrance upon arrival. For doctor visits, patients may have  1 support person age 18 or older with them. For treatment visits, patients can not have anyone with them due to social distancing guidelines and our immunocompromised population.      

## 2020-05-06 NOTE — Progress Notes (Signed)
Patient presents today for treatment.  Vital signs with in parameters.  No new complaints since last visit.  Labs reviewed, BUN 61, creatinine 5.38.  ANC pending.  Patient has a recently placed fistula in his right forearm.  Treatment given today per MD orders.  Tolerated infusion without adverse affects.  Vital signs stable.  No complaints at this time.  Discharge from clinic ambulatory in stable condition.  Alert and oriented X 3.  Follow up with [facility] as scheduled.

## 2020-05-06 NOTE — Assessment & Plan Note (Signed)
1. CLL, Rai stage IV: -BMBX on 08/10/2016 shows bone marrow involvement by CLL. -He also had bulky diffuse adenopathy with progressive thrombocytopenia. -Imbruvica 420 mg from 08/19/2016 through May 2018, discontinued due to subdural hematoma on 10/14/2016. -Subsequently offered chlorambucil and obinutuzumab. He refused obinutuzumab. Chlorambucil 30 mg every 2 weeks from June 2019 through 11/07/2018 due to severe weakness. -PET scan on 12/01/2019 showed multiple bilateral enlarged cervical lymph nodes, bilateral axillary and mediastinal adenopathy, prevascular lymph nodes, left lower paratracheal lymph node, minimal adenopathy in the retroperitoneum, external iliac lymph nodes. Spleen measures 16 cm. All lymph nodes measure less than 5 cm with SUV less than 5. 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 IGHV was mutated. -T p53 mutation is pending. -Venetoclax 10 mg started on 12/02/2019 (dose reduced secondary to Coreg), dose increased to 50 mg daily on 12/12/2019. -Dose increased to 100 mg daily on 12/19/2019, held on 12/25/2019 due to elevated creatinine. -Venetoclax 100 mg daily started back on 01/15/2020.  2. 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   PLAN:  1. CLL, Rai stage IV: -He is continuing venetoclax 100 mg daily without any problems. -CBC shows white count 3.5 with platelet count 75.  Hemoglobin is 10.  LFTs are normal. -We'll continue venetoclax at the same dose. -He will receive rituximab infusion today.  Today is cycle 4 of Rituxan -RTC 4 weeks for his next cycle.  2. CKD: -Creatinine increased to 6.5 today.  He will receive 500 mL of normal saline.  He is drinking about 100 ounces of water daily.  3. Tumor lysis prophylaxis: -Uric acid is staying in the normal after we discontinued allopurinol.  4. Macrocytic anemia: -Hemoglobin is 10.0.  No transfusion  needed.

## 2020-05-13 MED FILL — VENCLEXTA 100 MG TABS: 100 | 30 days supply | Qty: 60 | Fill #0

## 2020-05-25 ENCOUNTER — Other Ambulatory Visit: Payer: Self-pay | Admitting: *Deleted

## 2020-05-25 DIAGNOSIS — N185 Chronic kidney disease, stage 5: Secondary | ICD-10-CM

## 2020-05-28 DIAGNOSIS — I4891 Unspecified atrial fibrillation: Secondary | ICD-10-CM | POA: Diagnosis not present

## 2020-05-28 DIAGNOSIS — I129 Hypertensive chronic kidney disease with stage 1 through stage 4 chronic kidney disease, or unspecified chronic kidney disease: Secondary | ICD-10-CM | POA: Diagnosis not present

## 2020-05-28 DIAGNOSIS — I10 Essential (primary) hypertension: Secondary | ICD-10-CM | POA: Diagnosis not present

## 2020-05-28 DIAGNOSIS — N184 Chronic kidney disease, stage 4 (severe): Secondary | ICD-10-CM | POA: Diagnosis not present

## 2020-05-28 DIAGNOSIS — D696 Thrombocytopenia, unspecified: Secondary | ICD-10-CM | POA: Diagnosis not present

## 2020-05-28 DIAGNOSIS — C9110 Chronic lymphocytic leukemia of B-cell type not having achieved remission: Secondary | ICD-10-CM | POA: Diagnosis not present

## 2020-05-28 DIAGNOSIS — E782 Mixed hyperlipidemia: Secondary | ICD-10-CM | POA: Diagnosis not present

## 2020-05-28 DIAGNOSIS — R7301 Impaired fasting glucose: Secondary | ICD-10-CM | POA: Diagnosis not present

## 2020-05-28 DIAGNOSIS — D649 Anemia, unspecified: Secondary | ICD-10-CM | POA: Diagnosis not present

## 2020-05-28 DIAGNOSIS — N400 Enlarged prostate without lower urinary tract symptoms: Secondary | ICD-10-CM | POA: Diagnosis not present

## 2020-06-03 ENCOUNTER — Other Ambulatory Visit: Payer: Self-pay

## 2020-06-03 ENCOUNTER — Inpatient Hospital Stay (HOSPITAL_COMMUNITY): Payer: Medicare HMO

## 2020-06-03 ENCOUNTER — Inpatient Hospital Stay (HOSPITAL_COMMUNITY): Payer: Medicare HMO | Admitting: Hematology

## 2020-06-03 ENCOUNTER — Inpatient Hospital Stay (HOSPITAL_COMMUNITY): Payer: Medicare HMO | Attending: Hematology

## 2020-06-03 VITALS — BP 126/67 | HR 82 | Temp 97.9°F | Resp 17 | Wt 224.1 lb

## 2020-06-03 VITALS — BP 150/86 | HR 67 | Temp 96.8°F | Resp 18

## 2020-06-03 DIAGNOSIS — Z79899 Other long term (current) drug therapy: Secondary | ICD-10-CM | POA: Insufficient documentation

## 2020-06-03 DIAGNOSIS — C9110 Chronic lymphocytic leukemia of B-cell type not having achieved remission: Secondary | ICD-10-CM | POA: Diagnosis not present

## 2020-06-03 DIAGNOSIS — I2510 Atherosclerotic heart disease of native coronary artery without angina pectoris: Secondary | ICD-10-CM | POA: Diagnosis not present

## 2020-06-03 DIAGNOSIS — N184 Chronic kidney disease, stage 4 (severe): Secondary | ICD-10-CM | POA: Diagnosis not present

## 2020-06-03 DIAGNOSIS — I10 Essential (primary) hypertension: Secondary | ICD-10-CM | POA: Insufficient documentation

## 2020-06-03 DIAGNOSIS — I480 Paroxysmal atrial fibrillation: Secondary | ICD-10-CM | POA: Diagnosis not present

## 2020-06-03 DIAGNOSIS — N400 Enlarged prostate without lower urinary tract symptoms: Secondary | ICD-10-CM | POA: Diagnosis not present

## 2020-06-03 DIAGNOSIS — Z87891 Personal history of nicotine dependence: Secondary | ICD-10-CM | POA: Diagnosis not present

## 2020-06-03 DIAGNOSIS — Z5112 Encounter for antineoplastic immunotherapy: Secondary | ICD-10-CM | POA: Diagnosis not present

## 2020-06-03 LAB — CBC WITH DIFFERENTIAL/PLATELET
Abs Immature Granulocytes: 0 10*3/uL (ref 0.00–0.07)
Basophils Absolute: 0 10*3/uL (ref 0.0–0.1)
Basophils Relative: 0 %
Eosinophils Absolute: 0.1 10*3/uL (ref 0.0–0.5)
Eosinophils Relative: 4 %
HCT: 30.4 % — ABNORMAL LOW (ref 39.0–52.0)
Hemoglobin: 10.3 g/dL — ABNORMAL LOW (ref 13.0–17.0)
Immature Granulocytes: 0 %
Lymphocytes Relative: 13 %
Lymphs Abs: 0.4 10*3/uL — ABNORMAL LOW (ref 0.7–4.0)
MCH: 33.9 pg (ref 26.0–34.0)
MCHC: 33.9 g/dL (ref 30.0–36.0)
MCV: 100 fL (ref 80.0–100.0)
Monocytes Absolute: 0.4 10*3/uL (ref 0.1–1.0)
Monocytes Relative: 12 %
Neutro Abs: 2.2 10*3/uL (ref 1.7–7.7)
Neutrophils Relative %: 71 %
Platelets: 78 10*3/uL — ABNORMAL LOW (ref 150–400)
RBC: 3.04 MIL/uL — ABNORMAL LOW (ref 4.22–5.81)
RDW: 13.2 % (ref 11.5–15.5)
WBC: 3 10*3/uL — ABNORMAL LOW (ref 4.0–10.5)
nRBC: 0 % (ref 0.0–0.2)

## 2020-06-03 LAB — COMPREHENSIVE METABOLIC PANEL
ALT: 11 U/L (ref 0–44)
AST: 14 U/L — ABNORMAL LOW (ref 15–41)
Albumin: 3.8 g/dL (ref 3.5–5.0)
Alkaline Phosphatase: 53 U/L (ref 38–126)
Anion gap: 8 (ref 5–15)
BUN: 57 mg/dL — ABNORMAL HIGH (ref 8–23)
CO2: 21 mmol/L — ABNORMAL LOW (ref 22–32)
Calcium: 8.7 mg/dL — ABNORMAL LOW (ref 8.9–10.3)
Chloride: 111 mmol/L (ref 98–111)
Creatinine, Ser: 5.2 mg/dL — ABNORMAL HIGH (ref 0.61–1.24)
GFR, Estimated: 11 mL/min — ABNORMAL LOW (ref >60)
Glucose, Bld: 118 mg/dL — ABNORMAL HIGH (ref 70–99)
Potassium: 4 mmol/L (ref 3.5–5.1)
Sodium: 140 mmol/L (ref 135–145)
Total Bilirubin: 0.7 mg/dL (ref 0.3–1.2)
Total Protein: 6.3 g/dL — ABNORMAL LOW (ref 6.5–8.1)

## 2020-06-03 LAB — URIC ACID: Uric Acid, Serum: 5.9 mg/dL (ref 3.7–8.6)

## 2020-06-03 LAB — LACTATE DEHYDROGENASE: LDH: 131 U/L (ref 98–192)

## 2020-06-03 MED ORDER — SODIUM CHLORIDE 0.9 % IV SOLN: Freq: Once | INTRAVENOUS | Status: AC

## 2020-06-03 MED ORDER — ACETAMINOPHEN 325 MG PO TABS
650.0000 mg | ORAL_TABLET | Freq: Once | ORAL | Status: AC
  Administered 2020-06-03: 650 mg via ORAL

## 2020-06-03 MED ORDER — FAMOTIDINE IN NACL 20-0.9 MG/50ML-% IV SOLN
20.0000 mg | Freq: Once | INTRAVENOUS | Status: AC
  Administered 2020-06-03: 20 mg via INTRAVENOUS

## 2020-06-03 MED ORDER — SODIUM CHLORIDE 0.9 % IV SOLN
500.0000 mg/m2 | Freq: Once | INTRAVENOUS | Status: AC
  Administered 2020-06-03: 1100 mg via INTRAVENOUS
  Filled 2020-06-03: qty 100

## 2020-06-03 MED ORDER — ACETAMINOPHEN 325 MG PO TABS
ORAL_TABLET | ORAL | Status: AC
  Filled 2020-06-03: qty 2

## 2020-06-03 MED ORDER — FAMOTIDINE IN NACL 20-0.9 MG/50ML-% IV SOLN
INTRAVENOUS | Status: AC
  Filled 2020-06-03: qty 50

## 2020-06-03 MED ORDER — DIPHENHYDRAMINE HCL 50 MG/ML IJ SOLN
50.0000 mg | Freq: Once | INTRAMUSCULAR | Status: AC
  Administered 2020-06-03: 50 mg via INTRAVENOUS

## 2020-06-03 MED ORDER — DIPHENHYDRAMINE HCL 50 MG/ML IJ SOLN
INTRAMUSCULAR | Status: AC
  Filled 2020-06-03: qty 1

## 2020-06-03 NOTE — Progress Notes (Signed)
Patient presents today for Rituxan infusion.  Vital signs within parameters for treatment.  Labs reviewed .  Creatinine noted to be 5.20.  Message received from [facility], LPN/ Dr. Delton Coombes, patient okay for treatment.  Treatment given today per MD orders.  Tolerated infusion without adverse affects.  Vital signs stable.  No complaints at this time.  Discharge from clinic ambulatory in stable condition.  Alert and oriented X 3.  Follow up with [facility] as scheduled.

## 2020-06-03 NOTE — Progress Notes (Signed)
[facility] [address], [place] [address]   CLINIC:  Medical Oncology/Hematology  PCP:  [name], MD [address] [place] [address] [phone]   REASON FOR VISIT:  Follow-up for CLL  PRIOR THERAPY:  1. Imbruvica 420 mg from 08/19/2016 through 09/2016. 2. Chlorambucil 30 mg from 10/2017 through 11/07/2018.  NGS Results: Not done  CURRENT THERAPY: Venetoclax QD and rituximab monthly  BRIEF ONCOLOGIC HISTORY:  Oncology History  CLL (chronic lymphocytic leukemia) ([place])  03/09/2015 Initial Diagnosis   CLL (chronic lymphocytic leukemia) ([place])   08/03/2016 Imaging   CT neck- 1. Diffuse and bulky lymphadenopathy in keeping with history of CLL. Nodal enlargement has diffusely progressed since April 2016. 2. Chest and abdomen CT reported separately.   08/03/2016 Imaging   CT CAP- 1. Bulky lymphadenopathy in the chest, abdomen, and pelvis as described. 2. Splenomegaly. 3. Coronary artery and thoracoabdominal aortic atherosclerosis. 4. Prostatomegaly. 5. Several tiny lucent lesions in the bony anatomic pelvis, nonspecific, but attention on follow-up imaging recommended.   08/10/2016 Procedure   Bone marrow aspiration and biopsy by IR.   08/11/2016 Pathology Results   Bone Marrow, Aspirate,Biopsy, and Clot, left iliac crest - MARKED INVOLVEMENT BY CHRONIC LYMPHOCYTIC LEUKEMIA/SMALL LYMPHOCYTIC LYMPHOMA. PERIPHERAL BLOOD: - CHRONIC LYMPHOCYTIC LEUKEMIA. - THROMBOCYTOPENIA.   08/11/2016 Pathology Results   Bone Marrow Flow Cytometry - CHRONIC LYMPHOCYTIC LEUKEMIA/SMALL LYMPHOCYTIC LYMPHOMA.   08/16/2016 Pathology Results   Normal cytogenetics.  Molecular cytogenetic analysis by [facility] demonstrates a 13q-   08/19/2016 -  Chemotherapy   Imbruvica 420 mg daily    03/11/2020 -  Chemotherapy   The patient had riTUXimab-pvvr (RUXIENCE) 400 mg in sodium chloride 0.9 % 250 mL (1.3793 mg/mL) infusion, 900 mg, Intravenous,  Once, 3 of 6  cycles Dose modification: 500 mg/m2 (original dose 500 mg/m2, Cycle 2, Reason: Provider Judgment), 500 mg (original dose 500 mg, Cycle 1, Reason: Provider Judgment, Comment: day 2) Administration: 400 mg (03/11/2020), 1,100 mg (04/08/2020), 1,100 mg (05/06/2020), 500 mg (03/12/2020)  for chemotherapy treatment.      CANCER STAGING: Cancer Staging CLL (chronic lymphocytic leukemia) ([place]) Staging form: Chronic Lymphocytic Leukemia / Small Lymphocytic Lymphoma, AJCC 8th Edition - Clinical stage from 09/04/2016: Modified Rai Stage IV (Binet: Stage C, Modified Rai risk: High, Lymphocytosis: Present, Adenopathy: Present, Organomegaly: Present, Anemia: Absent, Thrombocytopenia: Present) - Signed by Baird Cancer, PA-C on 09/04/2016   INTERVAL HISTORY:  Andrew [name], a 80 y.o. male, returns for routine follow-up and consideration for next cycle of immunotherapy. Andrew [name] was last seen by Dr. Nicholas Lose on 05/06/2020.  Due for cycle #4 of rituximab today.   Today he is accompanied by his wife. Overall, he tells me he has been feeling pretty well. He is taking venetoclax daily and is tolerating it well. He denies having any palpitations, CP or lightheadedness. He started taking iron tablets on 12/9 and he reports having melena, but denies N/V/D/C. His appetite is excellent and he denies having mouth sores. He denies having any recent infections or F/C or night sweats. He is seeing his nephrologist, Dr. Kathe Mariner, every month.  Overall, he feels ready for next cycle of immunotherapy today.    REVIEW OF SYSTEMS:  Review of Systems  Constitutional: Positive for fatigue (75%). Negative for appetite change, chills, diaphoresis and fever.  HENT:   Negative for mouth sores.   Cardiovascular: Negative for chest pain and palpitations.  Gastrointestinal: Negative for constipation, diarrhea, nausea and vomiting.  Neurological: Negative for light-headedness.  All other systems reviewed and are  negative.   PAST MEDICAL/SURGICAL HISTORY:  Past Medical History:  Diagnosis Date  . Arthritis   . Ascending aortic aneurysm ([place])   . CKD (chronic kidney disease), stage IV ([facility])    followed by [facility]  . CLL (chronic lymphocytic leukemia) ([place])   . Complication of anesthesia    "wild when I woke up "  . History of kidney stones    passed   . Hypertension   . NICM (nonischemic cardiomyopathy) ([facility])    a. cath 2007, EF 35%, minimal CAD. b. EF normal 2018.  Marland Kitchen PAF (paroxysmal atrial fibrillation) ([facility] [place])   . Pinched nerve in neck   . Pneumonia   . SDH (subdural hematoma) ([place])   . Thrombocytopenia ([facility])    Past Surgical History:  Procedure Laterality Date  . AV FISTULA PLACEMENT Left 04/30/2020   Procedure: LEFT ARM BRACHIOCEPHALIC ARTERIOVENOUS (AV) FISTULA CREATION;  Surgeon: Serafina Mitchell, MD;  Location: [facility];  Service: Vascular;  Laterality: Left;  . BIOPSY  08/05/2018   Procedure: BIOPSY;  Surgeon: Danie Binder, MD;  [facility];  Service: Endoscopy;;  gastric   . Bone marrow biospy Bilateral   . CATARACT EXTRACTION, BILATERAL    . COLONOSCOPY  07/10/2011   Procedure: COLONOSCOPY;  Surgeon: Dorothyann Peng, MD;  [facility];  Service: Endoscopy;  Laterality: N/A;  10:30 AM  . CRANIOTOMY N/A 10/14/2016   Procedure: CRANIOTOMY HEMATOMA EVACUATION SUBDURAL;  Surgeon: Jovita Gamma, MD;  Location: [facility];  Service: Neurosurgery;  Laterality: N/A;  CRANIOTOMY HEMATOMA EVACUATION SUBDURAL  . ESOPHAGOGASTRODUODENOSCOPY N/A 08/05/2018   Procedure: ESOPHAGOGASTRODUODENOSCOPY (EGD);  Surgeon: Danie Binder, MD;  [facility];  Service: Endoscopy;  Laterality: N/A;  3:00pm  . THYROIDECTOMY, PARTIAL    . TONSILLECTOMY      SOCIAL HISTORY:  Social History   Socioeconomic History  . Marital status: Married    Spouse name: Not on file  . Number of children: Not on file  . Years of education: Not on file  . Highest education level:  Not on file  Occupational History  . Not on file  Tobacco Use  . Smoking status: Former Smoker    Packs/day: 1.00    Years: 15.00    Pack years: 15.00    Quit date: 01/26/1971    Years since quitting: 49.3  . Smokeless tobacco: Never Used  Vaping Use  . Vaping Use: Never used  Substance and Sexual Activity  . Alcohol use: No  . Drug use: No  . Sexual activity: Not on file  Other Topics Concern  . Not on file  Social History Narrative  . Not on file   Social Determinants of Health   Financial Resource Strain: Not on file  Food Insecurity: Not on file  Transportation Needs: Not on file  Physical Activity: Not on file  Stress: Not on file  Social Connections: Not on file  Intimate Partner Violence: Not on file    FAMILY HISTORY:  Family History  Problem Relation Age of Onset  . CAD Father 33  . Cancer Mother   . Alzheimer's disease Mother   . Colon cancer Neg Hx     CURRENT MEDICATIONS:  Current Outpatient Medications  Medication Sig Dispense Refill  . amLODipine (NORVASC) 5 MG tablet Take 5 mg by mouth at bedtime.    . carvedilol (COREG) 6.25 MG tablet Take 1  tablet (6.25 mg total) by mouth 2 (two) times daily. 180 tablet 3  . Cholecalciferol (VITAMIN D3) 25 MCG (1000 UT) CAPS Take 1 capsule by mouth daily.    . Cyanocobalamin (VITAMIN B12) 1000 MCG TBCR Take 1,000 mcg by mouth daily.     . Multiple Vitamin (MULITIVITAMIN WITH MINERALS) TABS Take 1 tablet by mouth daily.    Marland Kitchen omeprazole (PRILOSEC) 20 MG capsule 1 PO 30 MINS PRIOR TO BREAKFAST. (Patient taking differently: Take 20 mg by mouth daily.) 90 capsule 3  . sodium bicarbonate 325 MG tablet Take 325 mg by mouth 3 (three) times daily.    . tamsulosin (FLOMAX) 0.4 MG CAPS capsule Take 0.4 mg by mouth daily.     Marland Kitchen venetoclax (VENCLEXTA) 100 MG tablet Take 1 tablet (100 mg total) by mouth daily. Tablets should be swallowed whole with a meal and a full glass of water. 60 tablet 0   No current  facility-administered medications for this visit.    ALLERGIES:  No Known Allergies  PHYSICAL EXAM:  Performance status (ECOG): 1 - Symptomatic but completely ambulatory  Vitals:   06/03/20 0851 06/03/20 0904  BP: 126/67   Pulse: (!) 113 82  Resp: 17   Temp: 97.9 F (36.6 C)   SpO2: 94%    Wt Readings from Last 3 Encounters:  06/03/20 224 lb 1.6 oz (101.7 kg)  05/06/20 226 lb (102.5 kg)  04/30/20 210 lb (95.3 kg)   Physical Exam Vitals reviewed.  Constitutional:      Appearance: Normal appearance.  Cardiovascular:     Rate and Rhythm: Normal rate and regular rhythm.     Pulses: Normal pulses.     Heart sounds: Normal heart sounds.  Pulmonary:     Effort: Pulmonary effort is normal.     Breath sounds: Normal breath sounds.  Abdominal:     Palpations: Abdomen is soft. There is no hepatomegaly, splenomegaly or mass.     Tenderness: There is no abdominal tenderness.     Hernia: No hernia is present.  Musculoskeletal:     Right lower leg: No edema.     Left lower leg: No edema.  Lymphadenopathy:     Cervical: No cervical adenopathy.     Lower Body: No right inguinal adenopathy. No left inguinal adenopathy.  Neurological:     General: No focal deficit present.     Mental Status: He is alert and oriented to person, place, and time.  Psychiatric:        Mood and Affect: Mood normal.        Behavior: Behavior normal.     LABORATORY DATA:  I have reviewed the labs as listed.  CBC Latest Ref Rng & Units 06/03/2020 05/06/2020 04/30/2020  WBC 4.0 - 10.5 K/uL 3.0(L) 4.0 -  Hemoglobin 13.0 - 17.0 g/dL 10.3(L) 9.8(L) 10.5(L)  Hematocrit 39.0 - 52.0 % 30.4(L) 28.7(L) 31.0(L)  Platelets 150 - 400 K/uL 78(L) 91(L) -   CMP Latest Ref Rng & Units 06/03/2020 05/06/2020 04/30/2020  Glucose 70 - 99 mg/dL 118(H) 107(H) 99  BUN 8 - 23 mg/dL 57(H) 61(H) 75(H)  Creatinine 0.61 - 1.24 mg/dL 5.20(H) 5.38(H) 6.30(H)  Sodium 135 - 145 mmol/L 140 136 142  Potassium 3.5 - 5.1 mmol/L 4.0 3.9  3.8  Chloride 98 - 111 mmol/L 111 108 113(H)  CO2 22 - 32 mmol/L 21(L) 20(L) -  Calcium 8.9 - 10.3 mg/dL 8.7(L) 8.5(L) -  Total Protein 6.5 - 8.1 g/dL 6.3(L) 6.1(L) -  Total Bilirubin 0.3 - 1.2 mg/dL 0.7 0.8 -  Alkaline Phos 38 - 126 U/L 53 55 -  AST 15 - 41 U/L 14(L) 17 -  ALT 0 - 44 U/L 11 7 -   Lab Results  Component Value Date   LDH 131 06/03/2020   LDH 122 05/06/2020   LDH 115 04/08/2020    DIAGNOSTIC IMAGING:  I have independently reviewed the scans and discussed with the patient. No results found.   ASSESSMENT:  1. CLL, Rai stage IV: -BMBX on 08/10/2016 shows bone marrow involvement by CLL. -He also had bulky diffuse adenopathy with progressive thrombocytopenia. -Imbruvica 420 mg from 08/19/2016 through May 2018, discontinued due to subdural hematoma on 10/14/2016. -Subsequently offered chlorambucil and obinutuzumab. He refused obinutuzumab. Chlorambucil 30 mg every 2 weeks from June 2019 through 11/07/2018 due to severe weakness. -PET scan on 12/01/2019 showed multiple bilateral enlarged cervical lymph nodes, bilateral axillary and mediastinal adenopathy, prevascular lymph nodes, left lower paratracheal lymph node, minimal adenopathy in the retroperitoneum, external iliac lymph nodes. Spleen measures 16 cm. All lymph nodes measure less than 5 cm with SUV less than 5. 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 IGHV was mutated. -T p53 mutation is pending. -Venetoclax 10 mg started on 12/02/2019 (dose reduced secondary to Coreg), dose increased to 50 mg daily on 12/12/2019. -Dose increased to 100 mg daily on 12/19/2019, held on 12/25/2019 due to elevated creatinine. -Venetoclax 100 mg daily started back on 01/15/2020.  2. 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   PLAN:  1. CLL, Rai stage IV: -He is tolerating monthly rituximab very well. -Reviewed labs today.   Creatinine improved to 5.2.  Platelet count is 78. -Proceed with cycle 4 of rituximab. -Continue venetoclax 100 mg daily. -RTC 1 month for follow-up.  2. CKD: -Creatinine improved to 5.2 today.  3. Tumor lysis prophylaxis: -Uric acid is 5.9 today.  We have discontinued allopurinol.  4. Macrocytic anemia: -Hemoglobin is 10.3 today.  He is taking iron tablet daily. -We will check ferritin, A67, folic acid at next visit.   Orders placed this encounter:  No orders of the defined types were placed in this encounter.    Derek Jack, MD [facility] [phone]   I, Milinda Antis, am acting as a scribe for Dr. Sanda Linger.  I, Derek Jack MD, have reviewed the above documentation for accuracy and completeness, and I agree with the above.

## 2020-06-03 NOTE — Patient Instructions (Signed)
[facility] Discharge Instructions for Patients Receiving Chemotherapy  Today you received the following chemotherapy agents   To help prevent nausea and vomiting after your treatment, we encourage you to take your nausea medication   If you develop nausea and vomiting that is not controlled by your nausea medication, call the clinic.   BELOW ARE SYMPTOMS THAT SHOULD BE REPORTED IMMEDIATELY:  *FEVER GREATER THAN 100.5 F  *CHILLS WITH OR WITHOUT FEVER  NAUSEA AND VOMITING THAT IS NOT CONTROLLED WITH YOUR NAUSEA MEDICATION  *UNUSUAL SHORTNESS OF BREATH  *UNUSUAL BRUISING OR BLEEDING  TENDERNESS IN MOUTH AND THROAT WITH OR WITHOUT PRESENCE OF ULCERS  *URINARY PROBLEMS  *BOWEL PROBLEMS  UNUSUAL RASH Items with * indicate a potential emergency and should be followed up as soon as possible.  Feel free to call the clinic should you have any questions or concerns. The clinic phone number is [phone].  Please show the CHEMO ALERT CARD at check-in to the Emergency Department and triage nurse.   

## 2020-06-03 NOTE — Progress Notes (Signed)
Patient assessed and labs reviewed by Dr. Katragadda. Okay to proceed with treatment. Primary RN and pharmacy aware. 

## 2020-06-03 NOTE — Patient Instructions (Signed)
[facility] at [facility] Discharge Instructions  You were seen today by Dr. Katragadda. He went over your recent results. You received your treatment today. Dr. Katragadda will see you back in 4 weeks for labs and follow up.   Thank you for choosing [facility] at [facility] to provide your oncology and hematology care.  To afford each patient quality time with our provider, please arrive at least 15 minutes before your scheduled appointment time.   If you have a lab appointment with the Cancer Center please come in thru the Main Entrance and check in at the main information desk  You need to re-schedule your appointment should you arrive 10 or more minutes late.  We strive to give you quality time with our providers, and arriving late affects you and other patients whose appointments are after yours.  Also, if you no show three or more times for appointments you may be dismissed from the clinic at the providers discretion.     Again, thank you for choosing [facility].  Our hope is that these requests will decrease the amount of time that you wait before being seen by our physicians.       _____________________________________________________________  Should you have questions after your visit to [facility], please contact our office at [phone] between the hours of 8:00 a.m. and 4:30 p.m.  Voicemails left after 4:00 p.m. will not be returned until the following business day.  For prescription refill requests, have your pharmacy contact our office and allow 72 hours.    Cancer Center Support Programs:   > Cancer Support Group  2nd Tuesday of the month 1pm-2pm, Journey Room    

## 2020-06-07 ENCOUNTER — Ambulatory Visit (INDEPENDENT_AMBULATORY_CARE_PROVIDER_SITE_OTHER): Payer: Medicare HMO | Admitting: Physician Assistant

## 2020-06-07 ENCOUNTER — Ambulatory Visit (HOSPITAL_COMMUNITY)
Admission: RE | Admit: 2020-06-07 | Discharge: 2020-06-07 | Disposition: A | Discharge status: Home / Self Care | Payer: Medicare HMO | Source: Ambulatory Visit | Attending: Physician Assistant | Admitting: Physician Assistant

## 2020-06-07 ENCOUNTER — Other Ambulatory Visit: Payer: Self-pay

## 2020-06-07 VITALS — BP 140/83 | HR 73 | Temp 98.2°F | Resp 18 | Ht 74.0 in | Wt 223.0 lb

## 2020-06-07 DIAGNOSIS — N185 Chronic kidney disease, stage 5: Secondary | ICD-10-CM

## 2020-06-07 NOTE — Progress Notes (Signed)
  POST OPERATIVE DIALYSIS ACCESS OFFICE NOTE    CC:  F/u for dialysis access surgery  HPI:  This is a 80 y.o. male who is s/p left brachiocephalic AVF by Dr. Trula Sl[facility] on 04/30/2020. He says he initially has some tingling and coolness to his left hand on POD 1 and this has resolved. He denies hand pain, numbness or lack of dexterity. No fever or chills.  He is not yet requiring hemodialysis. History of a. Fib.  No Known Allergies  Current Outpatient Medications  Medication Sig Dispense Refill  . amLODipine (NORVASC) 5 MG tablet Take 5 mg by mouth at bedtime.    . carvedilol (COREG) 6.25 MG tablet Take 1 tablet (6.25 mg total) by mouth 2 (two) times daily. 180 tablet 3  . Cholecalciferol (VITAMIN D3) 25 MCG (1000 UT) CAPS Take 1 capsule by mouth daily.    . Cyanocobalamin (VITAMIN B12) 1000 MCG TBCR Take 1,000 mcg by mouth daily.     . Multiple Vitamin (MULITIVITAMIN WITH MINERALS) TABS Take 1 tablet by mouth daily.    Marland Kitchen omeprazole (PRILOSEC) 20 MG capsule 1 PO 30 MINS PRIOR TO BREAKFAST. (Patient taking differently: Take 20 mg by mouth daily.) 90 capsule 3  . sodium bicarbonate 325 MG tablet Take 325 mg by mouth 3 (three) times daily.    . tamsulosin (FLOMAX) 0.4 MG CAPS capsule Take 0.4 mg by mouth daily.     Marland Kitchen venetoclax (VENCLEXTA) 100 MG tablet Take 1 tablet (100 mg total) by mouth daily. Tablets should be swallowed whole with a meal and a full glass of water. 60 tablet 0   No current facility-administered medications for this visit.     ROS:  See HPI  BP 140/83 (BP Location: Right Arm, Patient Position: Sitting, Cuff Size: Normal)   Pulse 73   Temp 98.2 F (36.8 C) (Temporal)   Resp 18   Ht 6\' 2"  (1.88 m)   Wt 223 lb (101.2 kg)   BMI 28.63 kg/m    Physical Exam:  General appearance: Well-developed, well-nourished male in no apparent distress Cardiac: Irregular rhythm, regular rate Respiratory: Nonlabored Incision: Well-healed Extremities: Bruit and thrill throughout  fistula.  5 out of 5 left hand grip strength with sensation intact.  Very mild pallor of distal digits.  I am unable to palpate radial pulse.  Brisk radial Doppler signal and ulnar and palmar arch signals are present.  These improve somewhat with compression of the fistula.  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  He has no hand pain, numbness and motor and sensation are intact.  Advised him to continue to monitor his left hand and to contact us should he develop tingling, numbness, hand pain or any problem with dexterity. -advised to not allow blood draws, IVs or BP readings from left arm -dialysis duplex today reveals fistula to be of adequate diameter and depth for access -the fistula/graft may be used starting July 29, 2020  Barbie Banner, PA-C 06/07/2020 3:27 PM [facility] [phone]  Clinic MD:  Trula Slade

## 2020-06-10 DIAGNOSIS — N179 Acute kidney failure, unspecified: Secondary | ICD-10-CM | POA: Diagnosis not present

## 2020-06-10 DIAGNOSIS — N12 Tubulo-interstitial nephritis, not specified as acute or chronic: Secondary | ICD-10-CM | POA: Diagnosis not present

## 2020-06-10 DIAGNOSIS — D696 Thrombocytopenia, unspecified: Secondary | ICD-10-CM | POA: Diagnosis not present

## 2020-06-10 DIAGNOSIS — N200 Calculus of kidney: Secondary | ICD-10-CM | POA: Diagnosis not present

## 2020-06-10 DIAGNOSIS — Z8616 Personal history of COVID-19: Secondary | ICD-10-CM | POA: Diagnosis not present

## 2020-06-10 DIAGNOSIS — I129 Hypertensive chronic kidney disease with stage 1 through stage 4 chronic kidney disease, or unspecified chronic kidney disease: Secondary | ICD-10-CM | POA: Diagnosis not present

## 2020-06-10 DIAGNOSIS — I770 Arteriovenous fistula, acquired: Secondary | ICD-10-CM | POA: Diagnosis not present

## 2020-06-10 DIAGNOSIS — C9110 Chronic lymphocytic leukemia of B-cell type not having achieved remission: Secondary | ICD-10-CM | POA: Diagnosis not present

## 2020-06-10 DIAGNOSIS — N184 Chronic kidney disease, stage 4 (severe): Secondary | ICD-10-CM | POA: Diagnosis not present

## 2020-06-16 DIAGNOSIS — H524 Presbyopia: Secondary | ICD-10-CM | POA: Diagnosis not present

## 2020-06-18 DIAGNOSIS — Z0100 Encounter for examination of eyes and vision without abnormal findings: Secondary | ICD-10-CM | POA: Diagnosis not present

## 2020-06-24 ENCOUNTER — Ambulatory Visit: Payer: Medicare HMO | Admitting: Cardiology

## 2020-06-24 ENCOUNTER — Encounter: Payer: Self-pay | Admitting: Cardiology

## 2020-06-24 ENCOUNTER — Other Ambulatory Visit: Payer: Self-pay

## 2020-06-24 VITALS — BP 134/78 | HR 78 | Ht 74.0 in | Wt 228.0 lb

## 2020-06-24 DIAGNOSIS — I10 Essential (primary) hypertension: Secondary | ICD-10-CM | POA: Diagnosis not present

## 2020-06-24 DIAGNOSIS — I712 Thoracic aortic aneurysm, without rupture, unspecified: Secondary | ICD-10-CM

## 2020-06-24 DIAGNOSIS — I480 Paroxysmal atrial fibrillation: Secondary | ICD-10-CM

## 2020-06-24 NOTE — Patient Instructions (Addendum)
Medication Instructions:  Your physician recommends that you continue on your current medications as directed. Please refer to the Current Medication list given to you today.  *If you need a refill on your cardiac medications before your next appointment, please call your pharmacy*   Lab Work: None today If you have labs (blood work) drawn today and your tests are completely normal, you will receive your results only by: Marland Kitchen MyChart Message (if you have MyChart) OR . A paper copy in the mail If you have any lab test that is abnormal or we need to change your treatment, we will call you to review the results.   Testing/Procedures: None today   Follow-Up: At [facility], you and your health needs are our priority.  As part of our continuing mission to provide you with exceptional heart care, we have created designated Provider Care Teams.  These Care Teams include your primary Cardiologist (physician) and Advanced Practice Providers (APPs -  Physician Assistants and Nurse Practitioners) who all work together to provide you with the care you need, when you need it.   Your next appointment:   6 month(s)  The format for your next appointment:   In Person  Provider:   Carlyle Dolly, MD   Other Instructions None      Thank you for choosing [facility] !

## 2020-06-24 NOTE — Progress Notes (Signed)
Clinical Summary Andrew [name] is a 80 y.o.male seen today for follow up of the following medical problems.  1. PAF - remote DCCV 15 years ago CHA2DS2 VASc score is at least 3, however given his subdural hematoma, hehasnotbeena candidate for anticoagulation   -no recent palpitations -compliant with meds  2. 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 Thrombocytopenia - followed by heme   5. HTN - he is compliant with meds   6.[facility] of [facility] [place] [place]he reports being told several year ago that his LVEF was 35%, this was around the time of his first diagnosis of afib. - 2018 echo with normal LVEF 55-60%.  - no recent edema   7. CKD - followed at [facility] - 03/2019 Cr 2.98  - 10/2019 Cr 5.23, last visit 2 weeks  - followed closely by neprhology  8. Ascending aortic aneurticaneurysm - 4.3 cmnoted by recent PET scan  - 10/2018 4.3 cm  9. COVID + - + covid test 10/26/19 - mild to moderate symptoms of fever, congestion, lost of tasted/appetite. 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 Arthritis   . Ascending aortic aneurysm ([place])   . CKD (chronic kidney disease), stage IV ([place])    followed by [facility]  . CLL (chronic lymphocytic leukemia) ([place])   . Complication of anesthesia    "wild when I woke up "  . History of kidney stones    passed   . Hypertension   . NICM (nonischemic cardiomyopathy) ([place])    a. cath 2007, EF 35%, minimal CAD. b. EF normal 2018.  Marland Kitchen PAF (paroxysmal atrial fibrillation) ([place])   . Pinched nerve in neck   . Pneumonia   . SDH (subdural hematoma) ([place])   . Thrombocytopenia (H[facility])      No Known Allergies   Current  Outpatient Medications  Medication Sig Dispense Refill  . amLODipine (NORVASC) 5 MG tablet Take 5 mg by mouth at bedtime.    . carvedilol (COREG) 6.25 MG tablet Take 1 tablet (6.25 mg total) by mouth 2 (two) times daily. 180 tablet 3  . Cholecalciferol (VITAMIN D3) 25 MCG (1000 UT) CAPS Take 1 capsule by mouth daily.    . Cyanocobalamin (VITAMIN B12) 1000 MCG TBCR Take 1,000 mcg by mouth daily.     . Multiple Vitamin (MULITIVITAMIN WITH MINERALS) TABS Take 1 tablet by mouth daily.    Marland Kitchen omeprazole (PRILOSEC) 20 MG capsule 1 PO 30 MINS PRIOR TO BREAKFAST. (Patient taking differently: Take 20 mg by mouth daily.) 90 capsule 3  . sodium bicarbonate 325 MG tablet Take 325 mg by mouth 3 (three) times daily.    . tamsulosin (FLOMAX) 0.4 MG CAPS capsule Take 0.4 mg by mouth daily.     Marland Kitchen venetoclax (VENCLEXTA) 100 MG tablet Take 1 tablet (100 mg total) by mouth daily. Tablets should be swallowed whole with a meal and a full glass of water. 60 tablet 0   No current facility-administered medications for this visit.     Past Surgical History:  Procedure Laterality Date  . AV FISTULA PLACEMENT Left 04/30/2020   Procedure: LEFT ARM BRACHIOCEPHALIC ARTERIOVENOUS (AV) FISTULA CREATION;  Surgeon: Serafina Mitchell, MD;  Location: [place];  Service: Vascular;  Laterality: Left;  . BIOPSY  08/05/2018   Procedure: BIOPSY;  Surgeon: Danie Binder, MD;  [facility];  Service: Endoscopy;;  gastric   . Bone marrow biospy Bilateral   . CATARACT EXTRACTION, BILATERAL    . COLONOSCOPY  07/10/2011   Procedure: COLONOSCOPY;  Surgeon: Dorothyann Peng, MD;  [facility];  Service: Endoscopy;  Laterality: N/A;  10:30 AM  . CRANIOTOMY N/A 10/14/2016   Procedure: CRANIOTOMY HEMATOMA EVACUATION SUBDURAL;  Surgeon: Jovita Gamma, MD;  Location: [place];  Service: Neurosurgery;  Laterality: N/A;  CRANIOTOMY HEMATOMA EVACUATION SUBDURAL  . ESOPHAGOGASTRODUODENOSCOPY N/A 08/05/2018   Procedure:  ESOPHAGOGASTRODUODENOSCOPY (EGD);  Surgeon: Danie Binder, MD;  [facility];  Service: Endoscopy;  Laterality: N/A;  3:00pm  . THYROIDECTOMY, PARTIAL    . TONSILLECTOMY       No Known Allergies    Family History  Problem Relation Age of Onset  . CAD Father 71  . Cancer Mother   . Alzheimer's disease Mother   . Colon cancer Neg Hx      Social History Andrew [name] reports that he quit smoking about 49 years ago. He has a 15.00 pack-year smoking history. He has never used smokeless tobacco. Andrew [name] reports no history of alcohol use.   Review of Systems CONSTITUTIONAL: No weight loss, fever, chills, weakness or fatigue.  HEENT: Eyes: No visual loss, blurred vision, double vision or yellow sclerae.No hearing loss, sneezing, congestion, runny nose or sore throat.  SKIN: No rash or itching.  CARDIOVASCULAR: per hpi RESPIRATORY: No shortness of breath, cough or sputum.  GASTROINTESTINAL: No anorexia, nausea, vomiting or diarrhea. No abdominal pain or blood.  GENITOURINARY: No burning on urination, no polyuria NEUROLOGICAL: No headache, dizziness, syncope, paralysis, ataxia, numbness or tingling in the extremities. No change in bowel or bladder control.  MUSCULOSKELETAL: No muscle, back pain, joint pain or stiffness.  LYMPHATICS: No enlarged nodes. No history of splenectomy.  PSYCHIATRIC: No history of depression or anxiety.  ENDOCRINOLOGIC: No reports of sweating, cold or heat intolerance. No polyuria or polydipsia.  Marland Kitchen   Physical Examination Today's Vitals   06/24/20 1326  BP: 134/78  Pulse: 78  SpO2: 99%  Weight: 228 lb (103.4 kg)  Height: 6\' 2"  (1.88 m)   Body mass index is 29.27 kg/m.  Gen: resting comfortably, no acute distress HEENT: no scleral icterus, pupils equal round and reactive, no palptable cervical adenopathy,  CV: RRR,no m/r/g, no jvd Resp: Clear to auscultation bilaterally GI: abdomen is soft, non-tender, non-distended, normal bowel  sounds, no hepatosplenomegaly MSK: extremities are warm, no edema.  Skin: warm, no rash Neuro:  no focal deficits Psych: appropriate affect   Diagnostic Studies  09/2016 echo Study Conclusions  - Left ventricle: The cavity size was normal. There was mild concentric hypertrophy. Systolic function was normal. The estimated ejection fraction was in the range of 55% to 60%. Wall motion was normal; there were no regional wall motion abnormalities. - Aortic valve: There was trivial regurgitation. - Left atrium: The atrium was mildly dilated.   Assessment and Plan  1. PAF - not on anticoag due to chronic thrombocytopenia and prior SDH.  - no recent symptoms, continue curent meds  2. HTN - bp at goal, continue current meds  3. Aortic aneurysm - mild and stable, getting routine PET scans for his cancer that has also been used to monitor his aneurysm - continue to monitor  [name], M.D.

## 2020-06-26 DIAGNOSIS — I4891 Unspecified atrial fibrillation: Secondary | ICD-10-CM | POA: Diagnosis not present

## 2020-06-26 DIAGNOSIS — E782 Mixed hyperlipidemia: Secondary | ICD-10-CM | POA: Diagnosis not present

## 2020-06-26 DIAGNOSIS — I10 Essential (primary) hypertension: Secondary | ICD-10-CM | POA: Diagnosis not present

## 2020-06-26 DIAGNOSIS — D696 Thrombocytopenia, unspecified: Secondary | ICD-10-CM | POA: Diagnosis not present

## 2020-06-26 DIAGNOSIS — N400 Enlarged prostate without lower urinary tract symptoms: Secondary | ICD-10-CM | POA: Diagnosis not present

## 2020-06-26 DIAGNOSIS — D649 Anemia, unspecified: Secondary | ICD-10-CM | POA: Diagnosis not present

## 2020-06-26 DIAGNOSIS — R7301 Impaired fasting glucose: Secondary | ICD-10-CM | POA: Diagnosis not present

## 2020-06-26 DIAGNOSIS — C9110 Chronic lymphocytic leukemia of B-cell type not having achieved remission: Secondary | ICD-10-CM | POA: Diagnosis not present

## 2020-06-26 DIAGNOSIS — N184 Chronic kidney disease, stage 4 (severe): Secondary | ICD-10-CM | POA: Diagnosis not present

## 2020-06-29 ENCOUNTER — Other Ambulatory Visit: Payer: Self-pay

## 2020-06-29 ENCOUNTER — Inpatient Hospital Stay (HOSPITAL_COMMUNITY): Payer: Medicare HMO | Attending: Hematology

## 2020-06-29 DIAGNOSIS — C9110 Chronic lymphocytic leukemia of B-cell type not having achieved remission: Secondary | ICD-10-CM

## 2020-06-29 DIAGNOSIS — Z809 Family history of malignant neoplasm, unspecified: Secondary | ICD-10-CM | POA: Insufficient documentation

## 2020-06-29 DIAGNOSIS — N189 Chronic kidney disease, unspecified: Secondary | ICD-10-CM | POA: Insufficient documentation

## 2020-06-29 DIAGNOSIS — D649 Anemia, unspecified: Secondary | ICD-10-CM | POA: Diagnosis not present

## 2020-06-29 DIAGNOSIS — Z5112 Encounter for antineoplastic immunotherapy: Secondary | ICD-10-CM | POA: Diagnosis present

## 2020-06-29 DIAGNOSIS — Z79899 Other long term (current) drug therapy: Secondary | ICD-10-CM | POA: Diagnosis not present

## 2020-06-29 DIAGNOSIS — Z87891 Personal history of nicotine dependence: Secondary | ICD-10-CM | POA: Insufficient documentation

## 2020-06-29 LAB — CBC WITH DIFFERENTIAL/PLATELET
Abs Immature Granulocytes: 0.01 10*3/uL (ref 0.00–0.07)
Basophils Absolute: 0 10*3/uL (ref 0.0–0.1)
Basophils Relative: 0 %
Eosinophils Absolute: 0.1 10*3/uL (ref 0.0–0.5)
Eosinophils Relative: 3 %
HCT: 30.3 % — ABNORMAL LOW (ref 39.0–52.0)
Hemoglobin: 10.2 g/dL — ABNORMAL LOW (ref 13.0–17.0)
Immature Granulocytes: 0 %
Lymphocytes Relative: 11 %
Lymphs Abs: 0.3 10*3/uL — ABNORMAL LOW (ref 0.7–4.0)
MCH: 33.6 pg (ref 26.0–34.0)
MCHC: 33.7 g/dL (ref 30.0–36.0)
MCV: 99.7 fL (ref 80.0–100.0)
Monocytes Absolute: 0.4 10*3/uL (ref 0.1–1.0)
Monocytes Relative: 14 %
Neutro Abs: 2.1 10*3/uL (ref 1.7–7.7)
Neutrophils Relative %: 72 %
Platelets: 89 10*3/uL — ABNORMAL LOW (ref 150–400)
RBC: 3.04 MIL/uL — ABNORMAL LOW (ref 4.22–5.81)
RDW: 13 % (ref 11.5–15.5)
WBC: 3 10*3/uL — ABNORMAL LOW (ref 4.0–10.5)
nRBC: 0 % (ref 0.0–0.2)

## 2020-06-29 LAB — LACTATE DEHYDROGENASE: LDH: 126 U/L (ref 98–192)

## 2020-06-29 LAB — FERRITIN: Ferritin: 70 ng/mL (ref 24–336)

## 2020-06-29 LAB — COMPREHENSIVE METABOLIC PANEL
ALT: 13 U/L (ref 0–44)
AST: 14 U/L — ABNORMAL LOW (ref 15–41)
Albumin: 3.6 g/dL (ref 3.5–5.0)
Alkaline Phosphatase: 57 U/L (ref 38–126)
Anion gap: 6 (ref 5–15)
BUN: 62 mg/dL — ABNORMAL HIGH (ref 8–23)
CO2: 21 mmol/L — ABNORMAL LOW (ref 22–32)
Calcium: 8.5 mg/dL — ABNORMAL LOW (ref 8.9–10.3)
Chloride: 111 mmol/L (ref 98–111)
Creatinine, Ser: 5.13 mg/dL — ABNORMAL HIGH (ref 0.61–1.24)
GFR, Estimated: 11 mL/min — ABNORMAL LOW (ref >60)
Glucose, Bld: 89 mg/dL (ref 70–99)
Potassium: 3.9 mmol/L (ref 3.5–5.1)
Sodium: 138 mmol/L (ref 135–145)
Total Bilirubin: 0.6 mg/dL (ref 0.3–1.2)
Total Protein: 6.2 g/dL — ABNORMAL LOW (ref 6.5–8.1)

## 2020-06-29 LAB — VITAMIN B12: Vitamin B-12: 995 pg/mL — ABNORMAL HIGH (ref 180–914)

## 2020-06-29 LAB — IRON AND TIBC
Iron: 69 ug/dL (ref 45–182)
Saturation Ratios: 23 % (ref 17.9–39.5)
TIBC: 298 ug/dL (ref 250–450)
UIBC: 229 ug/dL

## 2020-06-29 LAB — FOLATE: Folate: 26 ng/mL (ref >5.9)

## 2020-07-01 ENCOUNTER — Inpatient Hospital Stay (HOSPITAL_COMMUNITY): Payer: Medicare HMO

## 2020-07-01 ENCOUNTER — Other Ambulatory Visit: Payer: Self-pay

## 2020-07-01 ENCOUNTER — Inpatient Hospital Stay (HOSPITAL_COMMUNITY): Payer: Medicare HMO | Admitting: Hematology

## 2020-07-01 VITALS — BP 133/81 | HR 63 | Temp 98.0°F | Resp 18

## 2020-07-01 VITALS — BP 126/76 | HR 81 | Temp 98.1°F | Resp 17 | Wt 225.4 lb

## 2020-07-01 DIAGNOSIS — Z809 Family history of malignant neoplasm, unspecified: Secondary | ICD-10-CM | POA: Diagnosis not present

## 2020-07-01 DIAGNOSIS — C9110 Chronic lymphocytic leukemia of B-cell type not having achieved remission: Secondary | ICD-10-CM

## 2020-07-01 DIAGNOSIS — N189 Chronic kidney disease, unspecified: Secondary | ICD-10-CM | POA: Diagnosis not present

## 2020-07-01 DIAGNOSIS — D649 Anemia, unspecified: Secondary | ICD-10-CM | POA: Diagnosis not present

## 2020-07-01 DIAGNOSIS — Z79899 Other long term (current) drug therapy: Secondary | ICD-10-CM | POA: Diagnosis not present

## 2020-07-01 DIAGNOSIS — Z87891 Personal history of nicotine dependence: Secondary | ICD-10-CM | POA: Diagnosis not present

## 2020-07-01 DIAGNOSIS — D509 Iron deficiency anemia, unspecified: Secondary | ICD-10-CM | POA: Insufficient documentation

## 2020-07-01 DIAGNOSIS — N185 Chronic kidney disease, stage 5: Secondary | ICD-10-CM

## 2020-07-01 DIAGNOSIS — Z5112 Encounter for antineoplastic immunotherapy: Secondary | ICD-10-CM | POA: Diagnosis not present

## 2020-07-01 MED ORDER — SODIUM CHLORIDE 0.9 % IV SOLN
500.0000 mg/m2 | Freq: Once | INTRAVENOUS | Status: AC
  Administered 2020-07-01: 1100 mg via INTRAVENOUS
  Filled 2020-07-01: qty 100

## 2020-07-01 MED ORDER — DIPHENHYDRAMINE HCL 50 MG/ML IJ SOLN
50.0000 mg | Freq: Once | INTRAMUSCULAR | Status: AC
  Administered 2020-07-01: 50 mg via INTRAVENOUS

## 2020-07-01 MED ORDER — ACETAMINOPHEN 325 MG PO TABS
650.0000 mg | ORAL_TABLET | Freq: Once | ORAL | Status: AC
  Administered 2020-07-01: 650 mg via ORAL

## 2020-07-01 MED ORDER — ACETAMINOPHEN 325 MG PO TABS
ORAL_TABLET | ORAL | Status: AC
  Filled 2020-07-01: qty 2

## 2020-07-01 MED ORDER — SODIUM CHLORIDE 0.9 % IV SOLN: Freq: Once | INTRAVENOUS | Status: AC

## 2020-07-01 MED ORDER — FAMOTIDINE IN NACL 20-0.9 MG/50ML-% IV SOLN
20.0000 mg | Freq: Once | INTRAVENOUS | Status: AC
  Administered 2020-07-01: 20 mg via INTRAVENOUS

## 2020-07-01 MED ORDER — FAMOTIDINE IN NACL 20-0.9 MG/50ML-% IV SOLN
INTRAVENOUS | Status: AC
  Filled 2020-07-01: qty 50

## 2020-07-01 MED ORDER — DIPHENHYDRAMINE HCL 50 MG/ML IJ SOLN
INTRAMUSCULAR | Status: AC
  Filled 2020-07-01: qty 1

## 2020-07-01 MED ORDER — SODIUM CHLORIDE 0.9 % IV SOLN
300.0000 mg | Freq: Once | INTRAVENOUS | Status: AC
  Administered 2020-07-01: 300 mg via INTRAVENOUS
  Filled 2020-07-01: qty 10

## 2020-07-01 MED ORDER — VENETOCLAX 100 MG PO TABS: 200.0000 mg | ORAL_TABLET | Freq: Every day | ORAL | 0 refills | Status: DC

## 2020-07-01 NOTE — Patient Instructions (Addendum)
[place] at [facility] Discharge Instructions  You were seen today by Dr. Delton Coombes. He went over your recent results. You received your rituximab injection today. You will be scheduled to receive 2 iron infusions, one today and the next in 4 weeks. Start taking venetoclax 200 mg daily. If you develop easy bruising, spontaneous nosebleeds or severe fatigue, please call the office immediately. Dr. Delton Coombes will see you back in 4 weeks for labs and follow up.   Thank you for choosing [place] at [facility] to provide your oncology and hematology care.  To afford each patient quality time with our provider, please arrive at least 15 minutes before your scheduled appointment time.   If you have a lab appointment with the [facility] please come in thru the Main Entrance and check in at the main information desk  You need to re-schedule your appointment should you arrive 10 or more minutes late.  We strive to give you quality time with our providers, and arriving late affects you and other patients whose appointments are after yours.  Also, if you no show three or more times for appointments you may be dismissed from the clinic at the providers discretion.     Again, thank you for choosing [facility].  Our hope is that these requests will decrease the amount of time that you wait before being seen by our physicians.       _____________________________________________________________  Should you have questions after your visit to [facility], please contact our office at [phone] between the hours of 8:00 a.m. and 4:30 p.m.  Voicemails left after 4:00 p.m. will not be returned until the following business day.  For prescription refill requests, have your pharmacy contact our office and allow 72 hours.    Cancer Center Support Programs:   > Cancer Support Group  2nd Tuesday of the month 1pm-2pm, Journey Room

## 2020-07-01 NOTE — Progress Notes (Signed)
Received orders:  give 300mg  venofer today after rituximab and give second dose in 4 weeks when he comes back  Order entered as requested and approved by PA team to give today.  T.O. Dr Narda Bonds Aurora Mask, PharmD 07/01/20 @ [phone]

## 2020-07-01 NOTE — Progress Notes (Signed)
Andrew [name] [address], [place] [address]   CLINIC:  Medical Oncology/Hematology  PCP:  [name], MD [address] [place] [address]  [phone]  REASON FOR VISIT:  Follow-up for CLL  PRIOR THERAPY:  1. Imbruvica 420 mg from 08/19/2016 through 09/2016. 2. Chlorambucil 30 mg from 10/2017 through 11/07/2018.  CURRENT THERAPY: Venetoclax 100 mg daily & rituximab monthly  INTERVAL HISTORY:  [name], a 80 y.o. male, returns for routine follow-up for his CLL. [place] was last seen on 06/03/2020.  Today he is accompanied by his wife and he reports feeling well. He saw Dr. Marval Regal on 01/13 and was informed that he will not likely require HD; he will see him again in 2 months. He denies having any recent infections, F/C or night sweats. He is tolerating venetoclax 100 mg daily well; he received his last bottle on 01/20. He is taking iron tablets daily since December and denies having constipation. His energy levels are good and his appetite is excellent.   REVIEW OF SYSTEMS:  Review of Systems  Constitutional: Positive for fatigue (90%). Negative for appetite change, chills, diaphoresis and fever.  Gastrointestinal: Negative for constipation.  All other systems reviewed and are negative.   PAST MEDICAL/SURGICAL HISTORY:  Past Medical History:  Diagnosis Date  . Arthritis   . Ascending aortic aneurysm ([place])   . CKD (chronic kidney disease), stage IV ([place])    followed by [facility]  . CLL (chronic lymphocytic leukemia) ([place])   . Complication of anesthesia    "wild when I woke up "  . History of kidney stones    passed   . Hypertension   . NICM (nonischemic cardiomyopathy) ([place])    a. cath 2007, EF 35%, minimal CAD. b. EF normal 2018.  Marland Kitchen PAF (paroxysmal atrial fibrillation) ([place])   . Pinched nerve in neck   . Pneumonia   . SDH (subdural hematoma) ([place])   . Thrombocytopenia ([place])    Past Surgical History:  Procedure  Laterality Date  . AV FISTULA PLACEMENT Left 04/30/2020   Procedure: LEFT ARM BRACHIOCEPHALIC ARTERIOVENOUS (AV) FISTULA CREATION;  Surgeon: Serafina Mitchell, MD;  Location: [place];  Service: Vascular;  Laterality: Left;  . BIOPSY  08/05/2018   Procedure: BIOPSY;  Surgeon: Danie Binder, MD;  [facility];  Service: Endoscopy;;  gastric   . Bone marrow biospy Bilateral   . CATARACT EXTRACTION, BILATERAL    . COLONOSCOPY  07/10/2011   Procedure: COLONOSCOPY;  Surgeon: Dorothyann Peng, MD;  [facility];  Service: Endoscopy;  Laterality: N/A;  10:30 AM  . CRANIOTOMY N/A 10/14/2016   Procedure: CRANIOTOMY HEMATOMA EVACUATION SUBDURAL;  Surgeon: Jovita Gamma, MD;  Location: [place];  Service: Neurosurgery;  Laterality: N/A;  CRANIOTOMY HEMATOMA EVACUATION SUBDURAL  . ESOPHAGOGASTRODUODENOSCOPY N/A 08/05/2018   Procedure: ESOPHAGOGASTRODUODENOSCOPY (EGD);  Surgeon: Danie Binder, MD;  [facility];  Service: Endoscopy;  Laterality: N/A;  3:00pm  . THYROIDECTOMY, PARTIAL    . TONSILLECTOMY      SOCIAL HISTORY:  Social History   Socioeconomic History  . Marital status: Married    Spouse name: Not on file  . Number of children: Not on file  . Years of education: Not on file  . Highest education level: Not on file  Occupational History  . Not on file  Tobacco Use  . Smoking status: Former Smoker    Packs/day:  1.00    Years: 15.00    Pack years: 15.00    Quit date: 01/26/1971    Years since quitting: 49.4  . Smokeless tobacco: Never Used  Vaping Use  . Vaping Use: Never used  Substance and Sexual Activity  . Alcohol use: No  . Drug use: No  . Sexual activity: Not on file  Other Topics Concern  . Not on file  Social History Narrative  . Not on file   Social Determinants of Health   Financial Resource Strain: Not on file  Food Insecurity: Not on file  Transportation Needs: Not on file  Physical Activity: Not on file  Stress: Not on file  Social  Connections: Not on file  Intimate Partner Violence: Not At Risk  . Fear of Current or Ex-Partner: No  . Emotionally Abused: No  . Physically Abused: No  . Sexually Abused: No    FAMILY HISTORY:  Family History  Problem Relation Age of Onset  . CAD Father 61  . Cancer Mother   . Alzheimer's disease Mother   . Colon cancer Neg Hx     CURRENT MEDICATIONS:  Current Outpatient Medications  Medication Sig Dispense Refill  . amLODipine (NORVASC) 5 MG tablet Take 5 mg by mouth at bedtime.    . carvedilol (COREG) 6.25 MG tablet Take 1 tablet (6.25 mg total) by mouth 2 (two) times daily. 180 tablet 3  . Cholecalciferol (VITAMIN D3) 25 MCG (1000 UT) CAPS Take 1 capsule by mouth daily.    . Cyanocobalamin (VITAMIN B12) 1000 MCG TBCR Take 1,000 mcg by mouth daily.     . ferrous sulfate 325 (65 FE) MG tablet Take 325 mg by mouth daily with breakfast.    . Multiple Vitamin (MULITIVITAMIN WITH MINERALS) TABS Take 1 tablet by mouth daily.    Marland Kitchen omeprazole (PRILOSEC) 20 MG capsule 1 PO 30 MINS PRIOR TO BREAKFAST. (Patient taking differently: Take 20 mg by mouth daily.) 90 capsule 3  . sodium bicarbonate 325 MG tablet Take 325 mg by mouth 3 (three) times daily.    . tamsulosin (FLOMAX) 0.4 MG CAPS capsule Take 0.4 mg by mouth daily.     Marland Kitchen venetoclax (VENCLEXTA) 100 MG tablet Take 2 tablets (200 mg total) by mouth daily. Tablets should be swallowed whole with a meal and a full glass of water. 60 tablet 0   No current facility-administered medications for this visit.    ALLERGIES:  No Known Allergies  PHYSICAL EXAM:  Performance status (ECOG): 1 - Symptomatic but completely ambulatory  Vitals:   07/01/20 0931  BP: 126/76  Pulse: 81  Resp: 17  Temp: 98.1 F (36.7 C)  SpO2: 100%   Wt Readings from Last 3 Encounters:  07/01/20 225 lb 7 oz (102.3 kg)  06/24/20 228 lb (103.4 kg)  06/07/20 223 lb (101.2 kg)   Physical Exam Vitals reviewed.  Constitutional:      Appearance: Normal  appearance.  Chest:  Breasts:     Right: No axillary adenopathy.     Left: No axillary adenopathy.    Abdominal:     Palpations: Abdomen is soft. There is no hepatomegaly, splenomegaly or mass.     Tenderness: There is no abdominal tenderness.     Hernia: No hernia is present.  Musculoskeletal:     Right lower leg: No edema.     Left lower leg: No edema.  Lymphadenopathy:     Cervical: No cervical adenopathy.     Upper Body:  Right upper body: No axillary or pectoral adenopathy.     Left upper body: No axillary or pectoral adenopathy.  Neurological:     General: No focal deficit present.     Mental Status: He is alert and oriented to person, place, and time.  Psychiatric:        Mood and Affect: Mood normal.        Behavior: Behavior normal.     LABORATORY DATA:  I have reviewed the labs as listed.  CBC Latest Ref Rng & Units 06/29/2020 06/03/2020 05/06/2020  WBC 4.0 - 10.5 K/uL 3.0(L) 3.0(L) 4.0  Hemoglobin 13.0 - 17.0 g/dL 10.2(L) 10.3(L) 9.8(L)  Hematocrit 39.0 - 52.0 % 30.3(L) 30.4(L) 28.7(L)  Platelets 150 - 400 K/uL 89(L) 78(L) 91(L)   CMP Latest Ref Rng & Units 06/29/2020 06/03/2020 05/06/2020  Glucose 70 - 99 mg/dL 89 118(H) 107(H)  BUN 8 - 23 mg/dL 62(H) 57(H) 61(H)  Creatinine 0.61 - 1.24 mg/dL 5.13(H) 5.20(H) 5.38(H)  Sodium 135 - 145 mmol/L 138 140 136  Potassium 3.5 - 5.1 mmol/L 3.9 4.0 3.9  Chloride 98 - 111 mmol/L 111 111 108  CO2 22 - 32 mmol/L 21(L) 21(L) 20(L)  Calcium 8.9 - 10.3 mg/dL 8.5(L) 8.7(L) 8.5(L)  Total Protein 6.5 - 8.1 g/dL 6.2(L) 6.3(L) 6.1(L)  Total Bilirubin 0.3 - 1.2 mg/dL 0.6 0.7 0.8  Alkaline Phos 38 - 126 U/L 57 53 55  AST 15 - 41 U/L 14(L) 14(L) 17  ALT 0 - 44 U/L _0 Component Value Date/Time   RBC 3.04 (L) 06/29/2020 1008   MCV 99.7 06/29/2020 1008   MCH 33.6 06/29/2020 1008   MCHC 33.7 06/29/2020 1008   RDW 13.0 06/29/2020 1008   LYMPHSABS 0.3 (L) 06/29/2020 1008   MONOABS 0.4 06/29/2020 1008   EOSABS 0.1  06/29/2020 1008   BASOSABS 0.0 06/29/2020 1008   Lab Results  Component Value Date   LDH 126 06/29/2020   LDH 131 06/03/2020   LDH 122 05/06/2020   Lab Results  Component Value Date   TIBC 298 06/29/2020   TIBC 290 11/13/2019   FERRITIN 70 06/29/2020   FERRITIN 132 11/13/2019   IRONPCTSAT 23 06/29/2020   IRONPCTSAT 28 11/13/2019    DIAGNOSTIC IMAGING:  I have independently reviewed the scans and discussed with the patient. VAS US DUPLEX DIALYSIS ACCESS (AVF, AVG)  Result Date: 06/07/2020 DIALYSIS ACCESS Reason for Exam: Routine follow up. Access Site: Left Upper Extremity. Access Type: Brachial-cephalic AVF 16/11/3708 Performing Technologist: Ronal Fear RVS, RCS  Examination Guidelines: A complete evaluation includes B-mode imaging, spectral Doppler, color Doppler, and power Doppler as needed of all accessible portions of each vessel. Unilateral testing is considered an integral part of a complete examination. Limited examinations for reoccurring indications may be performed as noted.  Findings: +--------------------+----------+-----------------+--------+ AVF                 PSV (cm/s)Flow Vol (mL/min)Comments +--------------------+----------+-----------------+--------+ Native artery inflow   148          1111                +--------------------+----------+-----------------+--------+ AVF Anastomosis        833                              +--------------------+----------+-----------------+--------+  +------------+----------+-------------+----------+--------+ OUTFLOW VEINPSV (cm/s)Diameter (cm)Depth (cm)Describe +------------+----------+-------------+----------+--------+ Shoulder       324  0.61        0.26            +------------+----------+-------------+----------+--------+ Prox UA        198        0.64        0.26            +------------+----------+-------------+----------+--------+ Mid UA         173        0.68        0.33             +------------+----------+-------------+----------+--------+ Dist UA        150        0.93        0.22            +------------+----------+-------------+----------+--------+ AC Fossa       357        0.56        0.64            +------------+----------+-------------+----------+--------+   Summary: Patent arteriovenous fistula. *See table(s) above for measurements and observations.  Diagnosing physician: Harold Barban MD Electronically signed by Harold Barban MD on 06/07/2020 at 3:28:35 PM.   --------------------------------------------------------------------------------   Final      ASSESSMENT:  1. CLL, Rai stage IV: -BMBX on 08/10/2016 shows bone marrow involvement by CLL. -He also had bulky diffuse adenopathy with progressive thrombocytopenia. -Imbruvica 420 mg from 08/19/2016 through May 2018, discontinued due to subdural hematoma on 10/14/2016. -Subsequently offered chlorambucil and obinutuzumab. He refused obinutuzumab. Chlorambucil 30 mg every 2 weeks from June 2019 through 11/07/2018 due to severe weakness. -PET scan on 12/01/2019 showed multiple bilateral enlarged cervical lymph nodes, bilateral axillary and mediastinal adenopathy, prevascular lymph nodes, left lower paratracheal lymph node, minimal adenopathy in the retroperitoneum, external iliac lymph nodes. Spleen measures 16 cm. All lymph nodes measure less than 5 cm with SUV less than 5. 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 IGHV was mutated. -T p53 mutation is pending. -Venetoclax 10 mg started on 12/02/2019 (dose reduced secondary to Coreg), dose increased to 50 mg daily on 12/12/2019. -Dose increased to 100 mg daily on 12/19/2019, held on 12/25/2019 due to elevated creatinine. -Venetoclax 100 mg daily started back on 01/15/2020.  2. 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   PLAN:  1. CLL, Rai stage  IV: -He is taking venetoclax 100 mg daily. -CBC today shows white count 3.0 with normal ANC.  Platelet count is 89. -Proceed with cycle 5 of rituximab. -Increase venetoclax to 200 mg daily.  We cannot increase further as he is on carvedilol. -RTC 4 weeks with repeat labs.  2. CKD: -Creatinine improved to 5.13 today. -Continue follow-up with Dr. Marval Regal.  Left AV fistula was placed.  3. Tumor lysis prophylaxis: -Allopurinol was discontinued as uric acid was remaining normal.  4.  Normocytic anemia: -Hemoglobin is 10.2.  Ferritin is 71% saturation is 23. -Y65 and folic acid was normal. -Recommended Venofer 300 mg today and in 4 weeks.  Orders placed this encounter:  No orders of the defined types were placed in this encounter.    Derek Jack, MD [place] [phone]   I, Milinda Antis, am acting as a scribe for Dr. Sanda Linger.  I, Derek Jack MD, have reviewed the above documentation for accuracy and completeness, and I agree with the above.

## 2020-07-01 NOTE — Patient Instructions (Signed)
[facility] Discharge Instructions for Patients Receiving Chemotherapy   Beginning January 23rd 2017 lab work for the Cancer Center will be done in the  Main lab at [place] on 1st floor. If you have a lab appointment with the Cancer Center please come in thru the  Main Entrance and check in at the main information desk   Today you received the following chemotherapy agents Rituxan  To help prevent nausea and vomiting after your treatment, we encourage you to take your nausea medication   If you develop nausea and vomiting, or diarrhea that is not controlled by your medication, call the clinic.  The clinic phone number is [phone]. Office hours are Monday-Friday 8:30am-5:00pm.  BELOW ARE SYMPTOMS THAT SHOULD BE REPORTED IMMEDIATELY:  *FEVER GREATER THAN 101.0 F  *CHILLS WITH OR WITHOUT FEVER  NAUSEA AND VOMITING THAT IS NOT CONTROLLED WITH YOUR NAUSEA MEDICATION  *UNUSUAL SHORTNESS OF BREATH  *UNUSUAL BRUISING OR BLEEDING  TENDERNESS IN MOUTH AND THROAT WITH OR WITHOUT PRESENCE OF ULCERS  *URINARY PROBLEMS  *BOWEL PROBLEMS  UNUSUAL RASH Items with * indicate a potential emergency and should be followed up as soon as possible. If you have an emergency after office hours please contact your primary care physician or go to the nearest emergency department.  Please call the clinic during office hours if you have any questions or concerns.   You may also contact the Patient Navigator at [phone] should you have any questions or need assistance in obtaining follow up care.      Resources For Cancer Patients and their Caregivers ? American Cancer Society: Can assist with transportation, wigs, general needs, runs Look Good Feel Better.        1-888-227-6333 ? Cancer Care: Provides financial assistance, online support groups, medication/co-pay assistance.  1-800-813-HOPE (4673) ? [facility] Assists [place] cancer  patients and their families through emotional , educational and financial support.  336-427-4357 ? [place] DSS Where to apply for food stamps, Medicaid and utility assistance. 336-342-1394 ? [facility]: Transportation to medical appointments. 336-347-2287 ? Social Security Administration: May apply for disability if have a Stage IV cancer. 336-342-7796 1-800-772-1213 ? [place] Aging, Disability and Transit Services: Assists with nutrition, care and transit needs. 336-349-2343          

## 2020-07-01 NOTE — Progress Notes (Signed)
Andrew [name] presents today for D1C5 Rituxan. Pt denies any new changes or symptoms since last treatment. Lab results, including Cr 5.13 and platelets 89, and vitals have been reviewed and are stable and within parameters for treatment. Patient has been assessed by Dr. Delton Coombes who has approved proceeding with treatment today as planned.  Infusions tolerated without incident or complaint. VSS upon completion of treatment. IV flushed and deaccessed per protocol, see MAR and IV flowsheet for details. Discharged in satisfactory condition with follow up instructions.

## 2020-07-01 NOTE — Progress Notes (Signed)
Patient was assessed by Dr. Delton Coombes and labs have been reviewed.  Patient is okay to proceed with treatment today. Checking with Angie to see if iron can be given today as well per Dr. Raliegh Ip. Primary RN and pharmacy aware.

## 2020-07-02 ENCOUNTER — Other Ambulatory Visit (HOSPITAL_COMMUNITY): Payer: Self-pay

## 2020-07-02 ENCOUNTER — Other Ambulatory Visit (HOSPITAL_COMMUNITY): Payer: Self-pay | Admitting: Hematology and Oncology

## 2020-07-02 DIAGNOSIS — C9110 Chronic lymphocytic leukemia of B-cell type not having achieved remission: Secondary | ICD-10-CM

## 2020-07-05 ENCOUNTER — Other Ambulatory Visit (HOSPITAL_COMMUNITY): Payer: Self-pay | Admitting: Hematology

## 2020-07-05 DIAGNOSIS — C9110 Chronic lymphocytic leukemia of B-cell type not having achieved remission: Secondary | ICD-10-CM

## 2020-07-05 MED FILL — VENCLEXTA 100 MG TABS: 100 | 30 days supply | Qty: 60 | Fill #0

## 2020-07-05 NOTE — Telephone Encounter (Signed)
Chart reviewed. Venclexta refilled per Dr. Delton Coombes

## 2020-07-19 ENCOUNTER — Encounter: Payer: Self-pay | Admitting: Internal Medicine

## 2020-07-20 DIAGNOSIS — D485 Neoplasm of uncertain behavior of skin: Secondary | ICD-10-CM | POA: Diagnosis not present

## 2020-07-26 DIAGNOSIS — I4891 Unspecified atrial fibrillation: Secondary | ICD-10-CM | POA: Diagnosis not present

## 2020-07-26 DIAGNOSIS — I129 Hypertensive chronic kidney disease with stage 1 through stage 4 chronic kidney disease, or unspecified chronic kidney disease: Secondary | ICD-10-CM | POA: Diagnosis not present

## 2020-07-26 DIAGNOSIS — N400 Enlarged prostate without lower urinary tract symptoms: Secondary | ICD-10-CM | POA: Diagnosis not present

## 2020-07-26 DIAGNOSIS — D649 Anemia, unspecified: Secondary | ICD-10-CM | POA: Diagnosis not present

## 2020-07-26 DIAGNOSIS — C9110 Chronic lymphocytic leukemia of B-cell type not having achieved remission: Secondary | ICD-10-CM | POA: Diagnosis not present

## 2020-07-26 DIAGNOSIS — I10 Essential (primary) hypertension: Secondary | ICD-10-CM | POA: Diagnosis not present

## 2020-07-26 DIAGNOSIS — R7301 Impaired fasting glucose: Secondary | ICD-10-CM | POA: Diagnosis not present

## 2020-07-26 DIAGNOSIS — K22711 Barrett's esophagus with high grade dysplasia: Secondary | ICD-10-CM | POA: Diagnosis not present

## 2020-07-26 DIAGNOSIS — E782 Mixed hyperlipidemia: Secondary | ICD-10-CM | POA: Diagnosis not present

## 2020-07-26 DIAGNOSIS — D696 Thrombocytopenia, unspecified: Secondary | ICD-10-CM | POA: Diagnosis not present

## 2020-07-27 ENCOUNTER — Other Ambulatory Visit: Payer: Self-pay

## 2020-07-27 ENCOUNTER — Inpatient Hospital Stay (HOSPITAL_COMMUNITY): Payer: Medicare HMO | Attending: Hematology

## 2020-07-27 DIAGNOSIS — N184 Chronic kidney disease, stage 4 (severe): Secondary | ICD-10-CM | POA: Insufficient documentation

## 2020-07-27 DIAGNOSIS — I480 Paroxysmal atrial fibrillation: Secondary | ICD-10-CM | POA: Insufficient documentation

## 2020-07-27 DIAGNOSIS — Z79899 Other long term (current) drug therapy: Secondary | ICD-10-CM | POA: Diagnosis not present

## 2020-07-27 DIAGNOSIS — D649 Anemia, unspecified: Secondary | ICD-10-CM | POA: Insufficient documentation

## 2020-07-27 DIAGNOSIS — Z5112 Encounter for antineoplastic immunotherapy: Secondary | ICD-10-CM | POA: Insufficient documentation

## 2020-07-27 DIAGNOSIS — Z87891 Personal history of nicotine dependence: Secondary | ICD-10-CM | POA: Diagnosis not present

## 2020-07-27 DIAGNOSIS — Z8249 Family history of ischemic heart disease and other diseases of the circulatory system: Secondary | ICD-10-CM | POA: Diagnosis not present

## 2020-07-27 DIAGNOSIS — C9110 Chronic lymphocytic leukemia of B-cell type not having achieved remission: Secondary | ICD-10-CM | POA: Diagnosis present

## 2020-07-27 LAB — CBC WITH DIFFERENTIAL/PLATELET
Abs Immature Granulocytes: 0.01 10*3/uL (ref 0.00–0.07)
Basophils Absolute: 0 10*3/uL (ref 0.0–0.1)
Basophils Relative: 0 %
Eosinophils Absolute: 0.1 10*3/uL (ref 0.0–0.5)
Eosinophils Relative: 2 %
HCT: 33.7 % — ABNORMAL LOW (ref 39.0–52.0)
Hemoglobin: 11.2 g/dL — ABNORMAL LOW (ref 13.0–17.0)
Immature Granulocytes: 0 %
Lymphocytes Relative: 12 %
Lymphs Abs: 0.4 10*3/uL — ABNORMAL LOW (ref 0.7–4.0)
MCH: 33.2 pg (ref 26.0–34.0)
MCHC: 33.2 g/dL (ref 30.0–36.0)
MCV: 100 fL (ref 80.0–100.0)
Monocytes Absolute: 0.4 10*3/uL (ref 0.1–1.0)
Monocytes Relative: 14 %
Neutro Abs: 2.2 10*3/uL (ref 1.7–7.7)
Neutrophils Relative %: 72 %
Platelets: 84 10*3/uL — ABNORMAL LOW (ref 150–400)
RBC: 3.37 MIL/uL — ABNORMAL LOW (ref 4.22–5.81)
RDW: 13 % (ref 11.5–15.5)
WBC: 3.1 10*3/uL — ABNORMAL LOW (ref 4.0–10.5)
nRBC: 0 % (ref 0.0–0.2)

## 2020-07-27 LAB — COMPREHENSIVE METABOLIC PANEL
ALT: 12 U/L (ref 0–44)
AST: 13 U/L — ABNORMAL LOW (ref 15–41)
Albumin: 3.9 g/dL (ref 3.5–5.0)
Alkaline Phosphatase: 57 U/L (ref 38–126)
Anion gap: 10 (ref 5–15)
BUN: 53 mg/dL — ABNORMAL HIGH (ref 8–23)
CO2: 20 mmol/L — ABNORMAL LOW (ref 22–32)
Calcium: 8.7 mg/dL — ABNORMAL LOW (ref 8.9–10.3)
Chloride: 109 mmol/L (ref 98–111)
Creatinine, Ser: 4.52 mg/dL — ABNORMAL HIGH (ref 0.61–1.24)
GFR, Estimated: 12 mL/min — ABNORMAL LOW (ref >60)
Glucose, Bld: 107 mg/dL — ABNORMAL HIGH (ref 70–99)
Potassium: 3.9 mmol/L (ref 3.5–5.1)
Sodium: 139 mmol/L (ref 135–145)
Total Bilirubin: 0.6 mg/dL (ref 0.3–1.2)
Total Protein: 6.3 g/dL — ABNORMAL LOW (ref 6.5–8.1)

## 2020-07-27 LAB — LACTATE DEHYDROGENASE: LDH: 123 U/L (ref 98–192)

## 2020-07-27 LAB — URIC ACID: Uric Acid, Serum: 5.5 mg/dL (ref 3.7–8.6)

## 2020-07-28 ENCOUNTER — Other Ambulatory Visit (HOSPITAL_COMMUNITY): Payer: Medicare HMO

## 2020-07-29 ENCOUNTER — Inpatient Hospital Stay (HOSPITAL_COMMUNITY): Payer: Medicare HMO | Admitting: Hematology

## 2020-07-29 ENCOUNTER — Inpatient Hospital Stay (HOSPITAL_COMMUNITY): Payer: Medicare HMO

## 2020-07-29 ENCOUNTER — Other Ambulatory Visit: Payer: Self-pay

## 2020-07-29 VITALS — BP 145/82 | HR 72 | Temp 97.0°F | Resp 18

## 2020-07-29 VITALS — BP 145/75 | HR 70 | Temp 97.3°F | Resp 20 | Wt 227.4 lb

## 2020-07-29 DIAGNOSIS — N184 Chronic kidney disease, stage 4 (severe): Secondary | ICD-10-CM | POA: Diagnosis not present

## 2020-07-29 DIAGNOSIS — C9110 Chronic lymphocytic leukemia of B-cell type not having achieved remission: Secondary | ICD-10-CM | POA: Diagnosis not present

## 2020-07-29 DIAGNOSIS — Z79899 Other long term (current) drug therapy: Secondary | ICD-10-CM | POA: Diagnosis not present

## 2020-07-29 DIAGNOSIS — Z5112 Encounter for antineoplastic immunotherapy: Secondary | ICD-10-CM | POA: Diagnosis not present

## 2020-07-29 DIAGNOSIS — D509 Iron deficiency anemia, unspecified: Secondary | ICD-10-CM

## 2020-07-29 DIAGNOSIS — N185 Chronic kidney disease, stage 5: Secondary | ICD-10-CM

## 2020-07-29 DIAGNOSIS — D649 Anemia, unspecified: Secondary | ICD-10-CM | POA: Diagnosis not present

## 2020-07-29 DIAGNOSIS — Z87891 Personal history of nicotine dependence: Secondary | ICD-10-CM | POA: Diagnosis not present

## 2020-07-29 DIAGNOSIS — I480 Paroxysmal atrial fibrillation: Secondary | ICD-10-CM | POA: Diagnosis not present

## 2020-07-29 DIAGNOSIS — Z8249 Family history of ischemic heart disease and other diseases of the circulatory system: Secondary | ICD-10-CM | POA: Diagnosis not present

## 2020-07-29 MED ORDER — SODIUM CHLORIDE 0.9 % IV SOLN
500.0000 mg/m2 | Freq: Once | INTRAVENOUS | Status: AC
  Administered 2020-07-29: 1100 mg via INTRAVENOUS
  Filled 2020-07-29: qty 100

## 2020-07-29 MED ORDER — ACETAMINOPHEN 325 MG PO TABS
ORAL_TABLET | ORAL | Status: AC
  Filled 2020-07-29: qty 2

## 2020-07-29 MED ORDER — SODIUM CHLORIDE 0.9 % IV SOLN
300.0000 mg | Freq: Once | INTRAVENOUS | Status: AC
  Administered 2020-07-29: 300 mg via INTRAVENOUS
  Filled 2020-07-29: qty 15

## 2020-07-29 MED ORDER — FAMOTIDINE IN NACL 20-0.9 MG/50ML-% IV SOLN
20.0000 mg | Freq: Once | INTRAVENOUS | Status: AC
  Administered 2020-07-29: 20 mg via INTRAVENOUS

## 2020-07-29 MED ORDER — ACETAMINOPHEN 325 MG PO TABS
650.0000 mg | ORAL_TABLET | Freq: Once | ORAL | Status: AC
  Administered 2020-07-29: 650 mg via ORAL

## 2020-07-29 MED ORDER — SODIUM CHLORIDE 0.9 % IV SOLN: Freq: Once | INTRAVENOUS | Status: AC

## 2020-07-29 MED ORDER — DIPHENHYDRAMINE HCL 50 MG/ML IJ SOLN
INTRAMUSCULAR | Status: AC
  Filled 2020-07-29: qty 1

## 2020-07-29 MED ORDER — DIPHENHYDRAMINE HCL 50 MG/ML IJ SOLN
50.0000 mg | Freq: Once | INTRAMUSCULAR | Status: AC
  Administered 2020-07-29: 50 mg via INTRAVENOUS

## 2020-07-29 MED ORDER — FAMOTIDINE IN NACL 20-0.9 MG/50ML-% IV SOLN
INTRAVENOUS | Status: AC
  Filled 2020-07-29: qty 50

## 2020-07-29 NOTE — Progress Notes (Signed)
Andrew [name] presents today for D1C6 Rituximab and dose 2 Venofer. Pt denies any new changes or symptoms since last treatment. Lab results and vitals have been reviewed and are stable and within parameters for treatment. Patient has been assessed by Dr. Delton Coombes who has approved proceeding with treatment today as planned.  Infusions tolerated without incident or complaint. VSS upon completion of treatment. IV flushed and removed per protocol, see MAR and IV flowsheet for details. Discharged in satisfactory condition with follow up instructions.

## 2020-07-29 NOTE — Progress Notes (Signed)
Patient was assessed by Dr. Delton Coombes and labs have been reviewed.  Patient is okay to proceed with treatment last dose of rituxan and IV iron today. Primary RN and pharmacy aware.

## 2020-07-29 NOTE — Patient Instructions (Signed)
[place] at [facility] Discharge Instructions  You were seen today by Dr. Delton Coombes. He went over your recent results. You received your final treatment today; you also received an iron infusion. Dr. Delton Coombes will see you back in 1 month for labs and follow up.   Thank you for choosing [place] at [facility] to provide your oncology and hematology care.  To afford each patient quality time with our provider, please arrive at least 15 minutes before your scheduled appointment time.   If you have a lab appointment with the [facility] please come in thru the Main Entrance and check in at the main information desk  You need to re-schedule your appointment should you arrive 10 or more minutes late.  We strive to give you quality time with our providers, and arriving late affects you and other patients whose appointments are after yours.  Also, if you no show three or more times for appointments you may be dismissed from the clinic at the providers discretion.     Again, thank you for choosing [facility].  Our hope is that these requests will decrease the amount of time that you wait before being seen by our physicians.       _____________________________________________________________  Should you have questions after your visit to [facility], please contact our office at [phone] between the hours of 8:00 a.m. and 4:30 p.m.  Voicemails left after 4:00 p.m. will not be returned until the following business day.  For prescription refill requests, have your pharmacy contact our office and allow 72 hours.    Cancer Center Support Programs:   > Cancer Support Group  2nd Tuesday of the month 1pm-2pm, Journey Room

## 2020-07-29 NOTE — Patient Instructions (Signed)
[facility] Discharge Instructions for Patients Receiving Chemotherapy   Beginning January 23rd 2017 lab work for the [facility] will be done in the  Main lab at [facility] on 1st floor. If you have a lab appointment with the [place] please come in thru the  Main Entrance and check in at the main information desk   Today you received the following chemotherapy agents Rituximab and Venofer  To help prevent nausea and vomiting after your treatment, we encourage you to take your nausea medication  If you develop nausea and vomiting, or diarrhea that is not controlled by your medication, call the clinic.  The clinic phone number is [phone]. Office hours are Monday-Friday 8:30am-5:00pm.  BELOW ARE SYMPTOMS THAT SHOULD BE REPORTED IMMEDIATELY:  *FEVER GREATER THAN 101.0 F  *CHILLS WITH OR WITHOUT FEVER  NAUSEA AND VOMITING THAT IS NOT CONTROLLED WITH YOUR NAUSEA MEDICATION  *UNUSUAL SHORTNESS OF BREATH  *UNUSUAL BRUISING OR BLEEDING  TENDERNESS IN MOUTH AND THROAT WITH OR WITHOUT PRESENCE OF ULCERS  *URINARY PROBLEMS  *BOWEL PROBLEMS  UNUSUAL RASH Items with * indicate a potential emergency and should be followed up as soon as possible. If you have an emergency after office hours please contact your primary care physician or go to the nearest emergency department.  Please call the clinic during office hours if you have any questions or concerns.   You may also contact the Patient Navigator at [phone] should you have any questions or need assistance in obtaining follow up care.      Resources For Cancer Patients and their Caregivers ? American Cancer Society: Can assist with transportation, wigs, general needs, runs Look Good Feel Better.        865-177-2409 ? Cancer Care: Provides financial assistance, online support groups, medication/co-pay assistance.  1-800-813-HOPE 334-580-7870) ? [place] Assists  [place] cancer patients and their families through emotional , educational and financial support.  425-418-0323 ? [place] DSS Where to apply for food stamps, Medicaid and utility assistance. 231-105-6112 ? [facility]: Transportation to medical appointments. 769-152-9232 ? Social Security Administration: May apply for disability if have a Stage IV cancer. ([phone] ? [employer], Disability and Transit Services: Assists with nutrition, care and transit needs. 754-719-8364

## 2020-07-29 NOTE — Progress Notes (Signed)
Andrew [name] [address] [place] [address]   CLINIC:  Medical Oncology/Hematology  PCP:  [name], MD [address] [place] [address]  [phone]  REASON FOR VISIT:  Follow-up for CLL  PRIOR THERAPY:  1. Imbruvica 420 mg from 08/19/2016 through 09/2016. 2. Chlorambucil 30 mg from 10/2017 through 11/07/2018.  CURRENT THERAPY: Venetoclax 200 mg daily & rituximab monthly  INTERVAL HISTORY:  [name], a 80 y.o. male, returns for routine follow-up for his CLL. Niki was last seen on 07/01/2020.  Today he reports feeling well. He is taking venetoclax 200 mg daily for the past 1 month and is tolerating it well; he is not taking iron tablets and denies constipation when he was taking them. He denies having any recent infections, F/C, night sweats, N/V/D, abdominal pain or rashes. He is taking vitamin D 1,000 units daily.   REVIEW OF SYSTEMS:  Review of Systems  Constitutional: Positive for fatigue (75%). Negative for appetite change, chills, diaphoresis and fever.  Gastrointestinal: Negative for abdominal pain, constipation, diarrhea, nausea and vomiting.  Skin: Negative for rash.  All other systems reviewed and are negative.   PAST MEDICAL/SURGICAL HISTORY:  Past Medical History:  Diagnosis Date   Arthritis    Ascending aortic aneurysm (HCC)    CKD (chronic kidney disease), stage IV ([place])    followed by [facility]   CLL (chronic lymphocytic leukemia) ([place])    Complication of anesthesia    "wild when I woke up "   History of kidney stones    passed    Hypertension    NICM (nonischemic cardiomyopathy) ([place])    a. cath 2007, EF 35%, minimal CAD. b. EF normal 2018.   PAF (paroxysmal atrial fibrillation) (HCC)    Pinched nerve in neck    Pneumonia    SDH (subdural hematoma) (HCC)    Thrombocytopenia (HCC)    Past Surgical History:  Procedure Laterality Date   AV FISTULA PLACEMENT Left 04/30/2020    Procedure: LEFT ARM BRACHIOCEPHALIC ARTERIOVENOUS (AV) FISTULA CREATION;  Surgeon: Serafina Mitchell, MD;  Location: [place];  Service: Vascular;  Laterality: Left;   BIOPSY  08/05/2018   Procedure: BIOPSY;  Surgeon: Danie Binder, MD;  [facility];  Service: Endoscopy;;  gastric    Bone marrow biospy Bilateral    CATARACT EXTRACTION, BILATERAL     COLONOSCOPY  07/10/2011   Procedure: COLONOSCOPY;  Surgeon: Dorothyann Peng, MD;  [facility];  Service: Endoscopy;  Laterality: N/A;  10:30 AM   CRANIOTOMY N/A 10/14/2016   Procedure: CRANIOTOMY HEMATOMA EVACUATION SUBDURAL;  Surgeon: Jovita Gamma, MD;  Location: [facility];  Service: Neurosurgery;  Laterality: N/A;  CRANIOTOMY HEMATOMA EVACUATION SUBDURAL   ESOPHAGOGASTRODUODENOSCOPY N/A 08/05/2018   Procedure: ESOPHAGOGASTRODUODENOSCOPY (EGD);  Surgeon: Danie Binder, MD;  [facility];  Service: Endoscopy;  Laterality: N/A;  3:00pm   THYROIDECTOMY, PARTIAL     TONSILLECTOMY      SOCIAL HISTORY:  Social History   Socioeconomic History   Marital status: Married    Spouse name: Not on file   Number of children: Not on file   Years of education: Not on file   Highest education level: Not on file  Occupational History   Not on file  Tobacco Use   Smoking status: Former Smoker    Packs/day: 1.00    Years: 15.00    Pack years: 15.00  Quit date: 01/26/1971    Years since quitting: 49.5   Smokeless tobacco: Never Used  Vaping Use   Vaping Use: Never used  Substance and Sexual Activity   Alcohol use: No   Drug use: No   Sexual activity: Not on file  Other Topics Concern   Not on file  Social History Narrative   Not on file   Social Determinants of Health   Financial Resource Strain: Not on file  Food Insecurity: Not on file  Transportation Needs: Not on file  Physical Activity: Not on file  Stress: Not on file  Social Connections: Not on file  Intimate Partner Violence: Not  At Risk   Fear of Current or Ex-Partner: No   Emotionally Abused: No   Physically Abused: No   Sexually Abused: No    FAMILY HISTORY:  Family History  Problem Relation Age of Onset   CAD Father 50   Cancer Mother    Alzheimer's disease Mother    Colon cancer Neg Hx     CURRENT MEDICATIONS:  Current Outpatient Medications  Medication Sig Dispense Refill   amLODipine (NORVASC) 5 MG tablet Take 5 mg by mouth at bedtime.     carvedilol (COREG) 6.25 MG tablet Take 1 tablet (6.25 mg total) by mouth 2 (two) times daily. 180 tablet 3   Cholecalciferol (VITAMIN D3) 25 MCG (1000 UT) CAPS Take 1 capsule by mouth daily.     Cyanocobalamin (VITAMIN B12) 1000 MCG TBCR Take 1,000 mcg by mouth daily.      Multiple Vitamin (MULITIVITAMIN WITH MINERALS) TABS Take 1 tablet by mouth daily.     omeprazole (PRILOSEC) 20 MG capsule 1 PO 30 MINS PRIOR TO BREAKFAST. (Patient taking differently: Take 20 mg by mouth daily.) 90 capsule 3   sodium bicarbonate 325 MG tablet Take 325 mg by mouth 3 (three) times daily.     tamsulosin (FLOMAX) 0.4 MG CAPS capsule Take 0.4 mg by mouth daily.      VENCLEXTA 100 MG tablet TAKE 2 TABLETS (200 MG TOTAL) BY MOUTH DAILY 60 tablet 3   No current facility-administered medications for this visit.    ALLERGIES:  No Known Allergies  PHYSICAL EXAM:  Performance status (ECOG): 1 - Symptomatic but completely ambulatory  Vitals:   07/29/20 0858  BP: (!) 145/75  Pulse: 70  Resp: 20  Temp: (!) 97.3 F (36.3 C)  SpO2: 100%   Wt Readings from Last 3 Encounters:  07/29/20 227 lb 6.4 oz (103.1 kg)  07/01/20 225 lb 7 oz (102.3 kg)  06/24/20 228 lb (103.4 kg)   Physical Exam Vitals reviewed.  Constitutional:      Appearance: Normal appearance.  Cardiovascular:     Rate and Rhythm: Normal rate and regular rhythm.     Pulses: Normal pulses.     Heart sounds: Normal heart sounds.  Pulmonary:     Effort: Pulmonary effort is normal.     Breath  sounds: Normal breath sounds.  Chest:  Breasts:     Right: No axillary adenopathy or supraclavicular adenopathy.     Left: No axillary adenopathy or supraclavicular adenopathy.    Abdominal:     Palpations: Abdomen is soft. There is no hepatomegaly, splenomegaly or mass.     Tenderness: There is no abdominal tenderness.  Musculoskeletal:     Right lower leg: No edema.     Left lower leg: No edema.  Lymphadenopathy:     Cervical: Cervical adenopathy present.  Right cervical: Posterior cervical adenopathy (sub-cm LN) present.     Left cervical: Posterior cervical adenopathy (sub-cm LN) present.     Upper Body:     Right upper body: No supraclavicular, axillary or pectoral adenopathy.     Left upper body: No supraclavicular, axillary or pectoral adenopathy.  Skin:    Findings: No rash.  Neurological:     General: No focal deficit present.     Mental Status: He is alert and oriented to person, place, and time.  Psychiatric:        Mood and Affect: Mood normal.        Behavior: Behavior normal.     LABORATORY DATA:  I have reviewed the labs as listed.  CBC Latest Ref Rng & Units 07/27/2020 06/29/2020 06/03/2020  WBC 4.0 - 10.5 K/uL 3.1(L) 3.0(L) 3.0(L)  Hemoglobin 13.0 - 17.0 g/dL 11.2(L) 10.2(L) 10.3(L)  Hematocrit 39.0 - 52.0 % 33.7(L) 30.3(L) 30.4(L)  Platelets 150 - 400 K/uL 84(L) 89(L) 78(L)   CMP Latest Ref Rng & Units 07/27/2020 06/29/2020 06/03/2020  Glucose 70 - 99 mg/dL 107(H) 89 118(H)  BUN 8 - 23 mg/dL 53(H) 62(H) 57(H)  Creatinine 0.61 - 1.24 mg/dL 4.52(H) 5.13(H) 5.20(H)  Sodium 135 - 145 mmol/L 139 138 140  Potassium 3.5 - 5.1 mmol/L 3.9 3.9 4.0  Chloride 98 - 111 mmol/L 109 111 111  CO2 22 - 32 mmol/L 20(L) 21(L) 21(L)  Calcium 8.9 - 10.3 mg/dL 8.7(L) 8.5(L) 8.7(L)  Total Protein 6.5 - 8.1 g/dL 6.3(L) 6.2(L) 6.3(L)  Total Bilirubin 0.3 - 1.2 mg/dL 0.6 0.6 0.7  Alkaline Phos 38 - 126 U/L 57 57 53  AST 15 - 41 U/L 13(L) 14(L) 14(L)  ALT 0 - 44 U/L '12 13 11       ' Component Value Date/Time   RBC 3.37 (L) 07/27/2020 1035   MCV 100.0 07/27/2020 1035   MCH 33.2 07/27/2020 1035   MCHC 33.2 07/27/2020 1035   RDW 13.0 07/27/2020 1035   LYMPHSABS 0.4 (L) 07/27/2020 1035   MONOABS 0.4 07/27/2020 1035   EOSABS 0.1 07/27/2020 1035   BASOSABS 0.0 07/27/2020 1035   Lab Results  Component Value Date   LDH 123 07/27/2020   LDH 126 06/29/2020   LDH 131 06/03/2020    DIAGNOSTIC IMAGING:  I have independently reviewed the scans and discussed with the patient. No results found.   ASSESSMENT:  1. CLL, Rai stage IV: -BMBX on 08/10/2016 shows bone marrow involvement by CLL. -He also had bulky diffuse adenopathy with progressive thrombocytopenia. -Imbruvica 420 mg from 08/19/2016 through May 2018, discontinued due to subdural hematoma on 10/14/2016. -Subsequently offered chlorambucil and obinutuzumab. He refused obinutuzumab. Chlorambucil 30 mg every 2 weeks from June 2019 through 11/07/2018 due to severe weakness. -PET scan on 12/01/2019 showed multiple bilateral enlarged cervical lymph nodes, bilateral axillary and mediastinal adenopathy, prevascular lymph nodes, left lower paratracheal lymph node, minimal adenopathy in the retroperitoneum, external iliac lymph nodes. Spleen measures 16 cm. All lymph nodes measure less than 5 cm with SUV less than 5. 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 IGHV was mutated. -T p53 mutation is pending. -Venetoclax 10 mg started on 12/02/2019 (dose reduced secondary to Coreg), dose increased to 50 mg daily on 12/12/2019. -Dose increased to 100 mg daily on 12/19/2019, held on 12/25/2019 due to elevated creatinine. -Venetoclax 100 mg daily started back on 01/15/2020.  Dose increased to 200 mg daily on 07/01/2020.  2. CKD: -Kidney biopsy on 12/19/2017 shows  CLL involvement with diffuse severe interstitial nephritis and associated tubular centric granulomatous reaction.   PLAN:  1. CLL, Rai stage IV: -Venetoclax dose increased  to 200 mg daily on 07/01/2020. -He is tolerating venetoclax very well. -Reviewed his labs.  LDH is normal.  LFTs are normal.  Platelet count is stable at 84.  White count is 3.1 with ANC of 2.2. -He will proceed with cycle 6 of rituximab today. -RTC 4 weeks for follow-up with labs.  2. CKD: -Creatinine improved to 4.52. -Continue follow-up with Dr. Marval Regal.  Left AV fistula was placed.  3. Tumor lysis prophylaxis: -Uric acid today is 5.5.  Allopurinol discontinued.  4.  Normocytic anemia: -He received Venofer 300 mg 4 weeks ago.  Hemoglobin improved 11.2.  He will receive 1 more Venofer today.  Orders placed this encounter:  Orders Placed This Encounter  Procedures   CBC with Differential/Platelet   Comprehensive metabolic panel   Lactate dehydrogenase     Derek Jack, MD [place] [phone]   I, Milinda Antis, am acting as a scribe for Dr. Sanda Linger.  I, Derek Jack MD, have reviewed the above documentation for accuracy and completeness, and I agree with the above.

## 2020-08-02 DIAGNOSIS — N179 Acute kidney failure, unspecified: Secondary | ICD-10-CM | POA: Diagnosis not present

## 2020-08-02 DIAGNOSIS — N184 Chronic kidney disease, stage 4 (severe): Secondary | ICD-10-CM | POA: Diagnosis not present

## 2020-08-02 DIAGNOSIS — I129 Hypertensive chronic kidney disease with stage 1 through stage 4 chronic kidney disease, or unspecified chronic kidney disease: Secondary | ICD-10-CM | POA: Diagnosis not present

## 2020-08-02 DIAGNOSIS — Z8616 Personal history of COVID-19: Secondary | ICD-10-CM | POA: Diagnosis not present

## 2020-08-02 DIAGNOSIS — N200 Calculus of kidney: Secondary | ICD-10-CM | POA: Diagnosis not present

## 2020-08-02 DIAGNOSIS — C9110 Chronic lymphocytic leukemia of B-cell type not having achieved remission: Secondary | ICD-10-CM | POA: Diagnosis not present

## 2020-08-02 DIAGNOSIS — N12 Tubulo-interstitial nephritis, not specified as acute or chronic: Secondary | ICD-10-CM | POA: Diagnosis not present

## 2020-08-02 DIAGNOSIS — I770 Arteriovenous fistula, acquired: Secondary | ICD-10-CM | POA: Diagnosis not present

## 2020-08-02 DIAGNOSIS — D696 Thrombocytopenia, unspecified: Secondary | ICD-10-CM | POA: Diagnosis not present

## 2020-08-02 MED FILL — VENCLEXTA 100 MG TABS: 100 | 30 days supply | Qty: 60 | Fill #1

## 2020-08-23 DIAGNOSIS — E782 Mixed hyperlipidemia: Secondary | ICD-10-CM | POA: Diagnosis not present

## 2020-08-23 DIAGNOSIS — R944 Abnormal results of kidney function studies: Secondary | ICD-10-CM | POA: Diagnosis not present

## 2020-08-23 DIAGNOSIS — D649 Anemia, unspecified: Secondary | ICD-10-CM | POA: Diagnosis not present

## 2020-08-23 DIAGNOSIS — R7301 Impaired fasting glucose: Secondary | ICD-10-CM | POA: Diagnosis not present

## 2020-08-23 DIAGNOSIS — N184 Chronic kidney disease, stage 4 (severe): Secondary | ICD-10-CM | POA: Diagnosis not present

## 2020-08-23 DIAGNOSIS — Z6828 Body mass index (BMI) 28.0-28.9, adult: Secondary | ICD-10-CM | POA: Diagnosis not present

## 2020-08-23 DIAGNOSIS — C9110 Chronic lymphocytic leukemia of B-cell type not having achieved remission: Secondary | ICD-10-CM | POA: Diagnosis not present

## 2020-08-23 DIAGNOSIS — E6609 Other obesity due to excess calories: Secondary | ICD-10-CM | POA: Diagnosis not present

## 2020-08-23 DIAGNOSIS — N189 Chronic kidney disease, unspecified: Secondary | ICD-10-CM | POA: Diagnosis not present

## 2020-08-23 DIAGNOSIS — I10 Essential (primary) hypertension: Secondary | ICD-10-CM | POA: Diagnosis not present

## 2020-08-25 DIAGNOSIS — K22711 Barrett's esophagus with high grade dysplasia: Secondary | ICD-10-CM | POA: Diagnosis not present

## 2020-08-25 DIAGNOSIS — R7301 Impaired fasting glucose: Secondary | ICD-10-CM | POA: Diagnosis not present

## 2020-08-25 DIAGNOSIS — N184 Chronic kidney disease, stage 4 (severe): Secondary | ICD-10-CM | POA: Diagnosis not present

## 2020-08-25 DIAGNOSIS — D649 Anemia, unspecified: Secondary | ICD-10-CM | POA: Diagnosis not present

## 2020-08-25 DIAGNOSIS — I10 Essential (primary) hypertension: Secondary | ICD-10-CM | POA: Diagnosis not present

## 2020-08-25 DIAGNOSIS — I4891 Unspecified atrial fibrillation: Secondary | ICD-10-CM | POA: Diagnosis not present

## 2020-08-25 DIAGNOSIS — E782 Mixed hyperlipidemia: Secondary | ICD-10-CM | POA: Diagnosis not present

## 2020-08-25 DIAGNOSIS — N400 Enlarged prostate without lower urinary tract symptoms: Secondary | ICD-10-CM | POA: Diagnosis not present

## 2020-08-25 DIAGNOSIS — D696 Thrombocytopenia, unspecified: Secondary | ICD-10-CM | POA: Diagnosis not present

## 2020-08-25 DIAGNOSIS — C9110 Chronic lymphocytic leukemia of B-cell type not having achieved remission: Secondary | ICD-10-CM | POA: Diagnosis not present

## 2020-08-26 ENCOUNTER — Other Ambulatory Visit (HOSPITAL_COMMUNITY): Payer: Self-pay

## 2020-08-26 DIAGNOSIS — N400 Enlarged prostate without lower urinary tract symptoms: Secondary | ICD-10-CM | POA: Diagnosis not present

## 2020-08-26 DIAGNOSIS — I4891 Unspecified atrial fibrillation: Secondary | ICD-10-CM | POA: Diagnosis not present

## 2020-08-26 DIAGNOSIS — C9110 Chronic lymphocytic leukemia of B-cell type not having achieved remission: Secondary | ICD-10-CM | POA: Diagnosis not present

## 2020-08-26 DIAGNOSIS — E782 Mixed hyperlipidemia: Secondary | ICD-10-CM | POA: Diagnosis not present

## 2020-08-26 DIAGNOSIS — K22711 Barrett's esophagus with high grade dysplasia: Secondary | ICD-10-CM | POA: Diagnosis not present

## 2020-08-26 DIAGNOSIS — D696 Thrombocytopenia, unspecified: Secondary | ICD-10-CM | POA: Diagnosis not present

## 2020-08-26 DIAGNOSIS — D649 Anemia, unspecified: Secondary | ICD-10-CM | POA: Diagnosis not present

## 2020-08-26 DIAGNOSIS — R7301 Impaired fasting glucose: Secondary | ICD-10-CM | POA: Diagnosis not present

## 2020-08-26 DIAGNOSIS — N184 Chronic kidney disease, stage 4 (severe): Secondary | ICD-10-CM | POA: Diagnosis not present

## 2020-08-26 DIAGNOSIS — I10 Essential (primary) hypertension: Secondary | ICD-10-CM | POA: Diagnosis not present

## 2020-08-28 ENCOUNTER — Other Ambulatory Visit (HOSPITAL_COMMUNITY): Payer: Self-pay

## 2020-08-28 MED FILL — Venetoclax Tab 100 MG: ORAL | 30 days supply | Qty: 60 | Fill #0 | Status: AC

## 2020-08-30 ENCOUNTER — Other Ambulatory Visit: Payer: Self-pay

## 2020-08-30 ENCOUNTER — Inpatient Hospital Stay (HOSPITAL_COMMUNITY): Payer: Medicare HMO

## 2020-08-30 ENCOUNTER — Inpatient Hospital Stay (HOSPITAL_COMMUNITY): Payer: Medicare HMO | Attending: Hematology | Admitting: Hematology

## 2020-08-30 VITALS — BP 145/93 | HR 79 | Temp 97.3°F | Resp 18 | Wt 229.1 lb

## 2020-08-30 DIAGNOSIS — Z79899 Other long term (current) drug therapy: Secondary | ICD-10-CM | POA: Diagnosis not present

## 2020-08-30 DIAGNOSIS — R351 Nocturia: Secondary | ICD-10-CM | POA: Insufficient documentation

## 2020-08-30 DIAGNOSIS — Z809 Family history of malignant neoplasm, unspecified: Secondary | ICD-10-CM | POA: Diagnosis not present

## 2020-08-30 DIAGNOSIS — Z818 Family history of other mental and behavioral disorders: Secondary | ICD-10-CM | POA: Insufficient documentation

## 2020-08-30 DIAGNOSIS — R200 Anesthesia of skin: Secondary | ICD-10-CM | POA: Insufficient documentation

## 2020-08-30 DIAGNOSIS — N185 Chronic kidney disease, stage 5: Secondary | ICD-10-CM | POA: Diagnosis not present

## 2020-08-30 DIAGNOSIS — I120 Hypertensive chronic kidney disease with stage 5 chronic kidney disease or end stage renal disease: Secondary | ICD-10-CM | POA: Insufficient documentation

## 2020-08-30 DIAGNOSIS — I480 Paroxysmal atrial fibrillation: Secondary | ICD-10-CM | POA: Insufficient documentation

## 2020-08-30 DIAGNOSIS — R5383 Other fatigue: Secondary | ICD-10-CM | POA: Diagnosis not present

## 2020-08-30 DIAGNOSIS — Z87891 Personal history of nicotine dependence: Secondary | ICD-10-CM | POA: Insufficient documentation

## 2020-08-30 DIAGNOSIS — Z8249 Family history of ischemic heart disease and other diseases of the circulatory system: Secondary | ICD-10-CM | POA: Insufficient documentation

## 2020-08-30 DIAGNOSIS — C9110 Chronic lymphocytic leukemia of B-cell type not having achieved remission: Secondary | ICD-10-CM | POA: Diagnosis not present

## 2020-08-30 LAB — COMPREHENSIVE METABOLIC PANEL
ALT: 13 U/L (ref 0–44)
AST: 13 U/L — ABNORMAL LOW (ref 15–41)
Albumin: 3.9 g/dL (ref 3.5–5.0)
Alkaline Phosphatase: 52 U/L (ref 38–126)
Anion gap: 10 (ref 5–15)
BUN: 50 mg/dL — ABNORMAL HIGH (ref 8–23)
CO2: 21 mmol/L — ABNORMAL LOW (ref 22–32)
Calcium: 8.5 mg/dL — ABNORMAL LOW (ref 8.9–10.3)
Chloride: 108 mmol/L (ref 98–111)
Creatinine, Ser: 4.45 mg/dL — ABNORMAL HIGH (ref 0.61–1.24)
GFR, Estimated: 13 mL/min — ABNORMAL LOW (ref >60)
Glucose, Bld: 94 mg/dL (ref 70–99)
Potassium: 4.2 mmol/L (ref 3.5–5.1)
Sodium: 139 mmol/L (ref 135–145)
Total Bilirubin: 0.9 mg/dL (ref 0.3–1.2)
Total Protein: 6.4 g/dL — ABNORMAL LOW (ref 6.5–8.1)

## 2020-08-30 LAB — CBC WITH DIFFERENTIAL/PLATELET
Abs Immature Granulocytes: 0.01 10*3/uL (ref 0.00–0.07)
Basophils Absolute: 0 10*3/uL (ref 0.0–0.1)
Basophils Relative: 0 %
Eosinophils Absolute: 0 10*3/uL (ref 0.0–0.5)
Eosinophils Relative: 2 %
HCT: 33.4 % — ABNORMAL LOW (ref 39.0–52.0)
Hemoglobin: 11.4 g/dL — ABNORMAL LOW (ref 13.0–17.0)
Immature Granulocytes: 0 %
Lymphocytes Relative: 15 %
Lymphs Abs: 0.4 10*3/uL — ABNORMAL LOW (ref 0.7–4.0)
MCH: 33.4 pg (ref 26.0–34.0)
MCHC: 34.1 g/dL (ref 30.0–36.0)
MCV: 97.9 fL (ref 80.0–100.0)
Monocytes Absolute: 0.5 10*3/uL (ref 0.1–1.0)
Monocytes Relative: 18 %
Neutro Abs: 1.6 10*3/uL — ABNORMAL LOW (ref 1.7–7.7)
Neutrophils Relative %: 65 %
Platelets: 78 10*3/uL — ABNORMAL LOW (ref 150–400)
RBC: 3.41 MIL/uL — ABNORMAL LOW (ref 4.22–5.81)
RDW: 13.1 % (ref 11.5–15.5)
WBC: 2.5 10*3/uL — ABNORMAL LOW (ref 4.0–10.5)
nRBC: 0 % (ref 0.0–0.2)

## 2020-08-30 LAB — URIC ACID: Uric Acid, Serum: 6.2 mg/dL (ref 3.7–8.6)

## 2020-08-30 LAB — LACTATE DEHYDROGENASE: LDH: 132 U/L (ref 98–192)

## 2020-08-30 NOTE — Progress Notes (Signed)
Pt is taking Venclexta as prescribed with no side effects.

## 2020-08-30 NOTE — Progress Notes (Signed)
[facility] [address], [place] [address]   CLINIC:  Medical Oncology/Hematology  PCP:  [name], MD [address] [place] [address]  [phone]  REASON FOR VISIT:  Follow-up for CLL  PRIOR THERAPY:  1. Imbruvica 420 mg from 08/19/2016 through 09/2016. 2. Chlorambucil 30 mg from 10/2017 through 11/07/2018.  CURRENT THERAPY: Venetoclax 200 mg daily & rituximab monthly  INTERVAL HISTORY:  [name], a 80 y.o. male, returns for routine follow-up for his CLL. Antron was last seen on 07/29/2020.  Today he reports feeling well. He denies having any new issues. He is taking venetoclax 200 mg daily and getting rituximab monthly and is tolerating them well. His appetite is excellent. He complains of urinating frequently at night, but denies having difficulty with stream. He also has multiple bruises on his arms, but not on his legs or torso.  He received his COVID booster on 04/15/2020.   REVIEW OF SYSTEMS:  Review of Systems  Constitutional: Positive for fatigue (75%). Negative for appetite change.  Genitourinary: Positive for nocturia.   Neurological: Positive for numbness (toes).  All other systems reviewed and are negative.   PAST MEDICAL/SURGICAL HISTORY:  Past Medical History:  Diagnosis Date  . Arthritis   . Ascending aortic aneurysm ([facility])   . CKD (chronic kidney disease), stage IV ([facility])    followed by [facility]  . CLL (chronic lymphocytic leukemia) ([facility])   . Complication of anesthesia    "wild when I woke up "  . History of kidney stones    passed   . Hypertension   . NICM (nonischemic cardiomyopathy) ([facility])    a. cath 2007, EF 35%, minimal CAD. b. EF normal 2018.  Marland Kitchen PAF (paroxysmal atrial fibrillation) ([facility])   . Pinched nerve in neck   . Pneumonia   . SDH (subdural hematoma) ([facility])   . Thrombocytopenia ([facility])    Past Surgical History:  Procedure Laterality Date  . AV FISTULA PLACEMENT Left 04/30/2020    Procedure: LEFT ARM BRACHIOCEPHALIC ARTERIOVENOUS (AV) FISTULA CREATION;  Surgeon: Serafina Mitchell, MD;  Location: [facility];  Service: Vascular;  Laterality: Left;  . BIOPSY  08/05/2018   Procedure: BIOPSY;  Surgeon: Danie Binder, MD;  [facility];  Service: Endoscopy;;  gastric   . Bone marrow biospy Bilateral   . CATARACT EXTRACTION, BILATERAL    . COLONOSCOPY  07/10/2011   Procedure: COLONOSCOPY;  Surgeon: Dorothyann Peng, MD;  [facility];  Service: Endoscopy;  Laterality: N/A;  10:30 AM  . CRANIOTOMY N/A 10/14/2016   Procedure: CRANIOTOMY HEMATOMA EVACUATION SUBDURAL;  Surgeon: Jovita Gamma, MD;  Location: [facility];  Service: Neurosurgery;  Laterality: N/A;  CRANIOTOMY HEMATOMA EVACUATION SUBDURAL  . ESOPHAGOGASTRODUODENOSCOPY N/A 08/05/2018   Procedure: ESOPHAGOGASTRODUODENOSCOPY (EGD);  Surgeon: Danie Binder, MD;  [facility];  Service: Endoscopy;  Laterality: N/A;  3:00pm  . THYROIDECTOMY, PARTIAL    . TONSILLECTOMY      SOCIAL HISTORY:  Social History   Socioeconomic History  . Marital status: Married    Spouse name: Not on file  . Number of children: Not on file  . Years of education: Not on file  . Highest education level: Not on file  Occupational History  . Not on file  Tobacco Use  . Smoking status: Former Smoker    Packs/day: 1.00    Years: 15.00    Pack years: 15.00  Quit date: 01/26/1971    Years since quitting: 49.6  . Smokeless tobacco: Never Used  Vaping Use  . Vaping Use: Never used  Substance and Sexual Activity  . Alcohol use: No  . Drug use: No  . Sexual activity: Not on file  Other Topics Concern  . Not on file  Social History Narrative  . Not on file   Social Determinants of Health   Financial Resource Strain: Low Risk   . Difficulty of Paying Living Expenses: Not hard at all  Food Insecurity: No Food Insecurity  . Worried About Charity fundraiser in the Last Year: Never true  . Ran Out of Food in the  Last Year: Never true  Transportation Needs: No Transportation Needs  . Lack of Transportation (Medical): No  . Lack of Transportation (Non-Medical): No  Physical Activity: Inactive  . Days of Exercise per Week: 0 days  . Minutes of Exercise per Session: 0 min  Stress: No Stress Concern Present  . Feeling of Stress : Not at all  Social Connections: Moderately Integrated  . Frequency of Communication with Friends and Family: More than three times a week  . Frequency of Social Gatherings with Friends and Family: More than three times a week  . Attends Religious Services: More than 4 times per year  . Active Member of Clubs or Organizations: No  . Attends Archivist Meetings: Never  . Marital Status: Married  Human resources officer Violence: Not At Risk  . Fear of Current or Ex-Partner: No  . Emotionally Abused: No  . Physically Abused: No  . Sexually Abused: No    FAMILY HISTORY:  Family History  Problem Relation Age of Onset  . CAD Father 67  . Cancer Mother   . Alzheimer's disease Mother   . Colon cancer Neg Hx     CURRENT MEDICATIONS:  Current Outpatient Medications  Medication Sig Dispense Refill  . amLODipine (NORVASC) 5 MG tablet Take 5 mg by mouth at bedtime.    . carvedilol (COREG) 6.25 MG tablet Take 1 tablet (6.25 mg total) by mouth 2 (two) times daily. 180 tablet 3  . Cholecalciferol (VITAMIN D3) 25 MCG (1000 UT) CAPS Take 1 capsule by mouth daily.    . Cyanocobalamin (VITAMIN B12) 1000 MCG TBCR Take 1,000 mcg by mouth daily.     . Multiple Vitamin (MULITIVITAMIN WITH MINERALS) TABS Take 1 tablet by mouth daily.    Marland Kitchen omeprazole (PRILOSEC) 20 MG capsule 1 PO 30 MINS PRIOR TO BREAKFAST. (Patient taking differently: Take 20 mg by mouth daily.) 90 capsule 3  . sodium bicarbonate 325 MG tablet Take 325 mg by mouth 3 (three) times daily.    . tamsulosin (FLOMAX) 0.4 MG CAPS capsule Take 0.4 mg by mouth daily.     Marland Kitchen venetoclax (VENCLEXTA) 100 MG tablet TAKE 2 TABLETS  (200 MG TOTAL) BY MOUTH DAILY 60 tablet 3   No current facility-administered medications for this visit.    ALLERGIES:  No Known Allergies  PHYSICAL EXAM:  Performance status (ECOG): 1 - Symptomatic but completely ambulatory  Vitals:   08/30/20 1434  BP: (!) 145/93  Pulse: 79  Resp: 18  Temp: (!) 97.3 F (36.3 C)  SpO2: 96%   Wt Readings from Last 3 Encounters:  08/30/20 229 lb 0.9 oz (103.9 kg)  07/29/20 227 lb 6.4 oz (103.1 kg)  07/01/20 225 lb 7 oz (102.3 kg)   Physical Exam Vitals reviewed.  Constitutional:  Appearance: Normal appearance.  Cardiovascular:     Rate and Rhythm: Normal rate and regular rhythm.     Pulses: Normal pulses.     Heart sounds: Normal heart sounds.  Pulmonary:     Effort: Pulmonary effort is normal.     Breath sounds: Normal breath sounds.  Chest:  Breasts:     Right: No axillary adenopathy or supraclavicular adenopathy.     Left: No axillary adenopathy or supraclavicular adenopathy.    Musculoskeletal:     Right lower leg: No edema.     Left lower leg: No edema.  Lymphadenopathy:     Cervical: No cervical adenopathy.     Upper Body:     Right upper body: No supraclavicular, axillary or pectoral adenopathy.     Left upper body: No supraclavicular, axillary or pectoral adenopathy.  Neurological:     General: No focal deficit present.     Mental Status: He is alert and oriented to person, place, and time.  Psychiatric:        Mood and Affect: Mood normal.        Behavior: Behavior normal.     LABORATORY DATA:  I have reviewed the labs as listed.  CBC Latest Ref Rng & Units 07/27/2020 06/29/2020 06/03/2020  WBC 4.0 - 10.5 K/uL 3.1(L) 3.0(L) 3.0(L)  Hemoglobin 13.0 - 17.0 g/dL 11.2(L) 10.2(L) 10.3(L)  Hematocrit 39.0 - 52.0 % 33.7(L) 30.3(L) 30.4(L)  Platelets 150 - 400 K/uL 84(L) 89(L) 78(L)   CMP Latest Ref Rng & Units 08/30/2020 07/27/2020 06/29/2020  Glucose 70 - 99 mg/dL 94 107(H) 89  BUN 8 - 23 mg/dL 50(H) 53(H) 62(H)   Creatinine 0.61 - 1.24 mg/dL 4.45(H) 4.52(H) 5.13(H)  Sodium 135 - 145 mmol/L 139 139 138  Potassium 3.5 - 5.1 mmol/L 4.2 3.9 3.9  Chloride 98 - 111 mmol/L 108 109 111  CO2 22 - 32 mmol/L 21(L) 20(L) 21(L)  Calcium 8.9 - 10.3 mg/dL 8.5(L) 8.7(L) 8.5(L)  Total Protein 6.5 - 8.1 g/dL 6.4(L) 6.3(L) 6.2(L)  Total Bilirubin 0.3 - 1.2 mg/dL 0.9 0.6 0.6  Alkaline Phos 38 - 126 U/L 52 57 57  AST 15 - 41 U/L 13(L) 13(L) 14(L)  ALT 0 - 44 U/L _0 Component Value Date/Time   RBC 3.37 (L) 07/27/2020 1035   MCV 100.0 07/27/2020 1035   MCH 33.2 07/27/2020 1035   MCHC 33.2 07/27/2020 1035   RDW 13.0 07/27/2020 1035   LYMPHSABS 0.4 (L) 07/27/2020 1035   MONOABS 0.4 07/27/2020 1035   EOSABS 0.1 07/27/2020 1035   BASOSABS 0.0 07/27/2020 1035   Lab Results  Component Value Date   LDH 132 08/30/2020   LDH 123 07/27/2020   LDH 126 06/29/2020    DIAGNOSTIC IMAGING:  I have independently reviewed the scans and discussed with the patient. No results found.   ASSESSMENT:  1. CLL, Rai stage IV: -BMBX on 08/10/2016 shows bone marrow involvement by CLL. -He also had bulky diffuse adenopathy with progressive thrombocytopenia. -Imbruvica 420 mg from 08/19/2016 through May 2018, discontinued due to subdural hematoma on 10/14/2016. -Subsequently offered chlorambucil and obinutuzumab. He refused obinutuzumab. Chlorambucil 30 mg every 2 weeks from June 2019 through 11/07/2018 due to severe weakness. -PET scan on 12/01/2019 showed multiple bilateral enlarged cervical lymph nodes, bilateral axillary and mediastinal adenopathy, prevascular lymph nodes, left lower paratracheal lymph node, minimal adenopathy in the retroperitoneum, external iliac lymph nodes. Spleen measures 16 cm. All lymph nodes measure less  than 5 cm with SUV less than 5. 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 IGHV was mutated. -T p53 mutation is pending. -Venetoclax 10 mg started on 12/02/2019 (dose reduced secondary to  Coreg), dose increased to 50 mg daily on 12/12/2019. -Dose increased to 100 mg daily on 12/19/2019, held on 12/25/2019 due to elevated creatinine. -Venetoclax 100 mg daily started back on 01/15/2020.  Dose increased to 200 mg daily on 07/01/2020.  2. 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   PLAN:  1. CLL, Rai stage IV: -He has completed the 6 cycles of rituximab 4 weeks ago. -Continue venetoclax 200 mg daily. -Denies any fevers, night sweats or weight loss. -Reviewed labs from today which showed white count 2.5 with ANC of 1600.  Platelet count is 78.  Hemoglobin 11.4. -We will consider slowly increasing the dose of venetoclax.  We will see his numbers next visit in 4 weeks. -We talked about Evusheld given his anti-B cell therapy and suboptimal immune response to vaccines.  We talked about side effects in detail.  He is agreeable.  We will make a referral.  2. CKD: -Creatinine improved to 4.45.  Continue follow-up with Dr. Marval Regal.  Left AV fistula was placed.  3. Tumor lysis prophylaxis: -Uric acid is 6.2.  Allopurinol was discontinued.  4.Normocytic anemia: -Last Venofer on 07/29/2020.  Hemoglobin today is 11.4.  Orders placed this encounter:  Orders Placed This Encounter  Procedures  . Ambulatory Referral for Murlean Iba, MD [place] [phone]   I, Milinda Antis, am acting as a scribe for Dr. Sanda Linger.  I, Derek Jack MD, have reviewed the above documentation for accuracy and completeness, and I agree with the above.

## 2020-08-30 NOTE — Patient Instructions (Signed)
[place] at [facility] Discharge Instructions  You were seen today by Dr. Delton Coombes. He went over your recent results. You will be scheduled to have an Evusheld injection to boost your immune system; you may still receive your COVID booster as soon as it is available. Dr. Delton Coombes will see you back in 1 month for labs and follow up.   Thank you for choosing [place] at [facility] to provide your oncology and hematology care.  To afford each patient quality time with our provider, please arrive at least 15 minutes before your scheduled appointment time.   If you have a lab appointment with the [facility] please come in thru the Main Entrance and check in at the main information desk  You need to re-schedule your appointment should you arrive 10 or more minutes late.  We strive to give you quality time with our providers, and arriving late affects you and other patients whose appointments are after yours.  Also, if you no show three or more times for appointments you may be dismissed from the clinic at the providers discretion.     Again, thank you for choosing [facility].  Our hope is that these requests will decrease the amount of time that you wait before being seen by our physicians.       _____________________________________________________________  Should you have questions after your visit to [facility], please contact our office at [phone] between the hours of 8:00 a.m. and 4:30 p.m.  Voicemails left after 4:00 p.m. will not be returned until the following business day.  For prescription refill requests, have your pharmacy contact our office and allow 72 hours.    Cancer Center Support Programs:   > Cancer Support Group  2nd Tuesday of the month 1pm-2pm, Journey Room

## 2020-08-31 ENCOUNTER — Other Ambulatory Visit: Payer: Self-pay | Admitting: Adult Health

## 2020-08-31 ENCOUNTER — Encounter: Payer: Self-pay | Admitting: Adult Health

## 2020-08-31 ENCOUNTER — Other Ambulatory Visit (HOSPITAL_COMMUNITY): Payer: Self-pay

## 2020-08-31 DIAGNOSIS — C9110 Chronic lymphocytic leukemia of B-cell type not having achieved remission: Secondary | ICD-10-CM

## 2020-08-31 NOTE — Progress Notes (Signed)
I connected by phone with Andrew [name] on 08/31/2020, 9:42 AM to discuss the potential use of a new treatment, tixagevimab/cilgavimab, for pre-exposure prophylaxis for prevention of coronavirus disease 2019 (COVID-19) caused by the SARS-CoV-2 virus.  This patient is a 80 y.o. male that meets the FDA criteria for Emergency Use Authorization of tixagevimab/cilgavimab for pre-exposure prophylaxis of COVID-19 disease. Pt meets following criteria:  Age >12 yr and weight > 40kg  Not currently infected with SARS-CoV-2 and has no known recent exposure to an individual infected with SARS-CoV-2 AND o Who has moderate to severe immune compromise due to a medical condition or receipt of immunosuppressive medications or treatments and may not mount an adequate immune response to COVID-19 vaccination or  o Vaccination with any available COVID-19 vaccine, according to the approved or authorized schedule, is not recommended due to a history of severe adverse reaction (e.g., severe allergic reaction) to a COVID-19 vaccine(s) and/or COVID-19 vaccine component(s).  o Patient meets the following definition of mod-severe immune compromised status: 1. Received B-cell depleting therapies (e.g. rituximab, obinutuzumab, ocrelizumab, alemtuzumab) within last 6 months & age > or = 19  I have spoken and communicated the following to the patient or parent/caregiver regarding COVID monoclonal antibody treatment:  1. FDA has authorized the emergency use of tixagevimab/cilgavimab for the pre-exposure prophylaxis of COVID-19 in patients with moderate-severe immunocompromised status, who meet above EUA criteria.  2. The significant known and potential risks and benefits of COVID monoclonal antibody, and the extent to which such potential risks and benefits are unknown.  3. Information on available alternative treatments and the risks and benefits of those alternatives, including clinical trials.  4. The patient or parent/caregiver  has the option to accept or refuse COVID monoclonal antibody treatment.  After reviewing this information with the patient, agree to receive tixagevimab/cilgavimab. High priority schedule message sent for patient to receive injection on 4/14 at 9 am.    Scot Dock, NP, 08/31/2020, 9:42 AM

## 2020-09-02 ENCOUNTER — Other Ambulatory Visit (HOSPITAL_COMMUNITY): Payer: Self-pay

## 2020-09-09 ENCOUNTER — Inpatient Hospital Stay (HOSPITAL_COMMUNITY): Payer: Medicare HMO

## 2020-09-09 ENCOUNTER — Other Ambulatory Visit: Payer: Self-pay

## 2020-09-09 DIAGNOSIS — Z87891 Personal history of nicotine dependence: Secondary | ICD-10-CM | POA: Diagnosis not present

## 2020-09-09 DIAGNOSIS — N185 Chronic kidney disease, stage 5: Secondary | ICD-10-CM | POA: Diagnosis not present

## 2020-09-09 DIAGNOSIS — I120 Hypertensive chronic kidney disease with stage 5 chronic kidney disease or end stage renal disease: Secondary | ICD-10-CM | POA: Diagnosis not present

## 2020-09-09 DIAGNOSIS — C9110 Chronic lymphocytic leukemia of B-cell type not having achieved remission: Secondary | ICD-10-CM | POA: Diagnosis not present

## 2020-09-09 DIAGNOSIS — R351 Nocturia: Secondary | ICD-10-CM | POA: Diagnosis not present

## 2020-09-09 DIAGNOSIS — R200 Anesthesia of skin: Secondary | ICD-10-CM | POA: Diagnosis not present

## 2020-09-09 DIAGNOSIS — R5383 Other fatigue: Secondary | ICD-10-CM | POA: Diagnosis not present

## 2020-09-09 DIAGNOSIS — Z79899 Other long term (current) drug therapy: Secondary | ICD-10-CM | POA: Diagnosis not present

## 2020-09-09 DIAGNOSIS — I480 Paroxysmal atrial fibrillation: Secondary | ICD-10-CM | POA: Diagnosis not present

## 2020-09-09 DIAGNOSIS — Z8249 Family history of ischemic heart disease and other diseases of the circulatory system: Secondary | ICD-10-CM | POA: Diagnosis not present

## 2020-09-09 MED ORDER — TIXAGEVIMAB (PART OF EVUSHELD) INJECTION
300.0000 mg | Freq: Once | INTRAMUSCULAR | Status: AC
  Administered 2020-09-09: 300 mg via INTRAMUSCULAR
  Filled 2020-09-09: qty 3

## 2020-09-09 MED ORDER — CILGAVIMAB (PART OF EVUSHELD) INJECTION
300.0000 mg | Freq: Once | INTRAMUSCULAR | Status: AC
  Administered 2020-09-09: 300 mg via INTRAMUSCULAR
  Filled 2020-09-09: qty 3

## 2020-09-09 NOTE — Patient Instructions (Signed)
[facility] Discharge Instructions for Patients Receiving Evusheild  Today you received the following Evusheild injections  To help prevent nausea and vomiting after your treatment, we encourage you to take your nausea medication   If you develop nausea and vomiting that is not controlled by your nausea medication, call the clinic.   BELOW ARE SYMPTOMS THAT SHOULD BE REPORTED IMMEDIATELY:  *FEVER GREATER THAN 100.5 F  *CHILLS WITH OR WITHOUT FEVER  NAUSEA AND VOMITING THAT IS NOT CONTROLLED WITH YOUR NAUSEA MEDICATION  *UNUSUAL SHORTNESS OF BREATH  *UNUSUAL BRUISING OR BLEEDING  TENDERNESS IN MOUTH AND THROAT WITH OR WITHOUT PRESENCE OF ULCERS  *URINARY PROBLEMS  *BOWEL PROBLEMS  UNUSUAL RASH Items with * indicate a potential emergency and should be followed up as soon as possible.  Feel free to call the clinic should you have any questions or concerns. The clinic phone number is [phone].  Please show the [place] at check-in to the Emergency Department and triage nurse.

## 2020-09-09 NOTE — Progress Notes (Signed)
Patient presents today for Evushield injections.  Vital signs within parameters.  Patient has no new complaints since last visit.  Evusheild injection given today per MD orders.  Stable during injections and one hour post injection wait time without adverse affects.  Vital signs stable.  No complaints at this time.  Discharge from clinic ambulatory in stable condition.  Alert and oriented X 3.  Follow up with [facility] as scheduled.

## 2020-09-26 DIAGNOSIS — K219 Gastro-esophageal reflux disease without esophagitis: Secondary | ICD-10-CM | POA: Diagnosis not present

## 2020-09-26 DIAGNOSIS — E7800 Pure hypercholesterolemia, unspecified: Secondary | ICD-10-CM | POA: Diagnosis not present

## 2020-09-26 DIAGNOSIS — I10 Essential (primary) hypertension: Secondary | ICD-10-CM | POA: Diagnosis not present

## 2020-09-28 ENCOUNTER — Other Ambulatory Visit (HOSPITAL_COMMUNITY): Payer: Self-pay

## 2020-09-28 MED FILL — Venetoclax Tab 100 MG: ORAL | 30 days supply | Qty: 60 | Fill #1 | Status: CN

## 2020-09-29 ENCOUNTER — Inpatient Hospital Stay (HOSPITAL_COMMUNITY): Payer: Medicare HMO | Attending: Hematology | Admitting: Hematology

## 2020-09-29 ENCOUNTER — Other Ambulatory Visit (HOSPITAL_COMMUNITY): Payer: Self-pay | Admitting: *Deleted

## 2020-09-29 ENCOUNTER — Other Ambulatory Visit: Payer: Self-pay

## 2020-09-29 ENCOUNTER — Other Ambulatory Visit (HOSPITAL_COMMUNITY): Payer: Self-pay

## 2020-09-29 ENCOUNTER — Inpatient Hospital Stay (HOSPITAL_COMMUNITY): Payer: Medicare HMO

## 2020-09-29 VITALS — BP 124/77 | HR 75 | Temp 98.6°F | Resp 16 | Wt 232.2 lb

## 2020-09-29 DIAGNOSIS — N184 Chronic kidney disease, stage 4 (severe): Secondary | ICD-10-CM | POA: Insufficient documentation

## 2020-09-29 DIAGNOSIS — C9110 Chronic lymphocytic leukemia of B-cell type not having achieved remission: Secondary | ICD-10-CM

## 2020-09-29 DIAGNOSIS — D649 Anemia, unspecified: Secondary | ICD-10-CM | POA: Insufficient documentation

## 2020-09-29 DIAGNOSIS — Z87891 Personal history of nicotine dependence: Secondary | ICD-10-CM | POA: Insufficient documentation

## 2020-09-29 DIAGNOSIS — Z79899 Other long term (current) drug therapy: Secondary | ICD-10-CM | POA: Insufficient documentation

## 2020-09-29 LAB — CBC WITH DIFFERENTIAL/PLATELET
Abs Immature Granulocytes: 0.01 10*3/uL (ref 0.00–0.07)
Basophils Absolute: 0 10*3/uL (ref 0.0–0.1)
Basophils Relative: 0 %
Eosinophils Absolute: 0 10*3/uL (ref 0.0–0.5)
Eosinophils Relative: 1 %
HCT: 31.8 % — ABNORMAL LOW (ref 39.0–52.0)
Hemoglobin: 10.9 g/dL — ABNORMAL LOW (ref 13.0–17.0)
Immature Granulocytes: 0 %
Lymphocytes Relative: 14 %
Lymphs Abs: 0.4 10*3/uL — ABNORMAL LOW (ref 0.7–4.0)
MCH: 34.3 pg — ABNORMAL HIGH (ref 26.0–34.0)
MCHC: 34.3 g/dL (ref 30.0–36.0)
MCV: 100 fL (ref 80.0–100.0)
Monocytes Absolute: 0.6 10*3/uL (ref 0.1–1.0)
Monocytes Relative: 18 %
Neutro Abs: 2.1 10*3/uL (ref 1.7–7.7)
Neutrophils Relative %: 67 %
Platelets: 86 10*3/uL — ABNORMAL LOW (ref 150–400)
RBC: 3.18 MIL/uL — ABNORMAL LOW (ref 4.22–5.81)
RDW: 13.2 % (ref 11.5–15.5)
WBC: 3.1 10*3/uL — ABNORMAL LOW (ref 4.0–10.5)
nRBC: 0 % (ref 0.0–0.2)

## 2020-09-29 LAB — COMPREHENSIVE METABOLIC PANEL
ALT: 14 U/L (ref 0–44)
AST: 16 U/L (ref 15–41)
Albumin: 4 g/dL (ref 3.5–5.0)
Alkaline Phosphatase: 57 U/L (ref 38–126)
Anion gap: 7 (ref 5–15)
BUN: 47 mg/dL — ABNORMAL HIGH (ref 8–23)
CO2: 21 mmol/L — ABNORMAL LOW (ref 22–32)
Calcium: 8.7 mg/dL — ABNORMAL LOW (ref 8.9–10.3)
Chloride: 110 mmol/L (ref 98–111)
Creatinine, Ser: 4.22 mg/dL — ABNORMAL HIGH (ref 0.61–1.24)
GFR, Estimated: 14 mL/min — ABNORMAL LOW (ref >60)
Glucose, Bld: 98 mg/dL (ref 70–99)
Potassium: 4.1 mmol/L (ref 3.5–5.1)
Sodium: 138 mmol/L (ref 135–145)
Total Bilirubin: 1 mg/dL (ref 0.3–1.2)
Total Protein: 6.2 g/dL — ABNORMAL LOW (ref 6.5–8.1)

## 2020-09-29 LAB — URIC ACID: Uric Acid, Serum: 6.1 mg/dL (ref 3.7–8.6)

## 2020-09-29 LAB — LACTATE DEHYDROGENASE: LDH: 134 U/L (ref 98–192)

## 2020-09-29 MED ORDER — VENETOCLAX 100 MG PO TABS
300.0000 mg | ORAL_TABLET | Freq: Every day | ORAL | 3 refills | Status: DC
  Filled 2020-09-29: qty 90, 30d supply, fill #0
  Filled 2020-11-01: qty 90, 30d supply, fill #1
  Filled 2020-11-30: qty 90, 30d supply, fill #2
  Filled 2020-12-27: qty 90, 30d supply, fill #3

## 2020-09-29 NOTE — Patient Instructions (Addendum)
[place] at [facility] Discharge Instructions  You were seen today by Dr. Delton Coombes. He went over your recent results. Start taking venetoclax 300 mg (3 tablets) daily. Drink plenty of water daily to keep your kidneys flushed. Dr. Delton Coombes will see you back in 1 month for labs and follow up.   Thank you for choosing [place] at [facility] to provide your oncology and hematology care.  To afford each patient quality time with our provider, please arrive at least 15 minutes before your scheduled appointment time.   If you have a lab appointment with the [facility] please come in thru the Main Entrance and check in at the main information desk  You need to re-schedule your appointment should you arrive 10 or more minutes late.  We strive to give you quality time with our providers, and arriving late affects you and other patients whose appointments are after yours.  Also, if you no show three or more times for appointments you may be dismissed from the clinic at the providers discretion.     Again, thank you for choosing [facility].  Our hope is that these requests will decrease the amount of time that you wait before being seen by our physicians.       _____________________________________________________________  Should you have questions after your visit to [facility], please contact our office at [phone] between the hours of 8:00 a.m. and 4:30 p.m.  Voicemails left after 4:00 p.m. will not be returned until the following business day.  For prescription refill requests, have your pharmacy contact our office and allow 72 hours.    Cancer Center Support Programs:   > Cancer Support Group  2nd Tuesday of the month 1pm-2pm, Journey Room

## 2020-09-29 NOTE — Progress Notes (Signed)
 [facility] [address] [place], [place] [address]   CLINIC:  Medical Oncology/Hematology  PCP:  Hall, John Z, MD [address] / [place]  [id number]  [phone]  REASON FOR VISIT:  Follow-up for CLL  PRIOR THERAPY:  1. Imbruvica 420 mg from 08/19/2016 through 09/2016. 2. Chlorambucil 30 mg from 10/2017 through 11/07/2018.  CURRENT THERAPY: Venetoclax 200 mg daily  INTERVAL HISTORY:  [name], a 80 y.o. male, returns for routine follow-up for his CLL. [name] was last seen on 08/30/2020.  Today he is accompanied by his wife and he reports feeling well. His energy levels are good. He reports having 1 episode of diarrhea after receiving Evusheld. He is taking venetoclax daily and is tolerating it well; he denies having early satiety or N/V/D. He continues drinking 100 ounces of water daily.  He is scheduled to see Dr. Coladonato on 05/05.   REVIEW OF SYSTEMS:  Review of Systems  Constitutional: Negative for appetite change and fatigue.  Gastrointestinal: Positive for diarrhea (x1 episode after Evusheld). Negative for nausea and vomiting.  Neurological: Positive for numbness.  Psychiatric/Behavioral: Positive for sleep disturbance.  All other systems reviewed and are negative.   PAST MEDICAL/SURGICAL HISTORY:  Past Medical History:  Diagnosis Date  . Arthritis   . Ascending aortic aneurysm (HCC)   . CKD (chronic kidney disease), stage IV (HCC)    followed by [facility]  . CLL (chronic lymphocytic leukemia) (HCC)   . Complication of anesthesia    "wild when I woke up "  . History of kidney stones    passed   . Hypertension   . NICM (nonischemic cardiomyopathy) (HCC)    a. cath 2007, EF 35%, minimal CAD. b. EF normal 2018.  . PAF (paroxysmal atrial fibrillation) (HCC)   . Pinched nerve in neck   . Pneumonia   . SDH (subdural hematoma) (HCC)   . Thrombocytopenia (HCC)    Past Surgical History:  Procedure Laterality Date  . AV  FISTULA PLACEMENT Left 04/30/2020   Procedure: LEFT ARM BRACHIOCEPHALIC ARTERIOVENOUS (AV) FISTULA CREATION;  Surgeon: Brabham, Vance W, MD;  Location: [facility] OR;  Service: Vascular;  Laterality: Left;  . BIOPSY  08/05/2018   Procedure: BIOPSY;  Surgeon: Fields, Sandi L, MD;  [facility];  Service: Endoscopy;;  gastric   . Bone marrow biospy Bilateral   . CATARACT EXTRACTION, BILATERAL    . COLONOSCOPY  07/10/2011   Procedure: COLONOSCOPY;  Surgeon: Sandi M Fields, MD;  [facility];  Service: Endoscopy;  Laterality: N/A;  10:30 AM  . CRANIOTOMY N/A 10/14/2016   Procedure: CRANIOTOMY HEMATOMA EVACUATION SUBDURAL;  Surgeon: Nudelman, Robert, MD;  Location: [facility] OR;  Service: Neurosurgery;  Laterality: N/A;  CRANIOTOMY HEMATOMA EVACUATION SUBDURAL  . ESOPHAGOGASTRODUODENOSCOPY N/A 08/05/2018   Procedure: ESOPHAGOGASTRODUODENOSCOPY (EGD);  Surgeon: Fields, Sandi L, MD;  [facility];  Service: Endoscopy;  Laterality: N/A;  3:00pm  . THYROIDECTOMY, PARTIAL    . TONSILLECTOMY      SOCIAL HISTORY:  Social History   Socioeconomic History  . Marital status: Married    Spouse name: Not on file  . Number of children: Not on file  . Years of education: Not on file  . Highest education level: Not on file  Occupational History  . Not on file  Tobacco Use  . Smoking status: Former Smoker    Packs/day: 1.00    Years: 15.00    Pack   years: 15.00    Quit date: 01/26/1971    Years since quitting: 49.7  . Smokeless tobacco: Never Used  Vaping Use  . Vaping Use: Never used  Substance and Sexual Activity  . Alcohol use: No  . Drug use: No  . Sexual activity: Not on file  Other Topics Concern  . Not on file  Social History Narrative  . Not on file   Social Determinants of Health   Financial Resource Strain: Low Risk   . Difficulty of Paying Living Expenses: Not hard at all  Food Insecurity: No Food Insecurity  . Worried About Charity fundraiser in the Last Year:  Never true  . Ran Out of Food in the Last Year: Never true  Transportation Needs: No Transportation Needs  . Lack of Transportation (Medical): No  . Lack of Transportation (Non-Medical): No  Physical Activity: Inactive  . Days of Exercise per Week: 0 days  . Minutes of Exercise per Session: 0 min  Stress: No Stress Concern Present  . Feeling of Stress : Not at all  Social Connections: Moderately Integrated  . Frequency of Communication with Friends and Family: More than three times a week  . Frequency of Social Gatherings with Friends and Family: More than three times a week  . Attends Religious Services: More than 4 times per year  . Active Member of Clubs or Organizations: No  . Attends Archivist Meetings: Never  . Marital Status: Married  Human resources officer Violence: Not At Risk  . Fear of Current or Ex-Partner: No  . Emotionally Abused: No  . Physically Abused: No  . Sexually Abused: No    FAMILY HISTORY:  Family History  Problem Relation Age of Onset  . CAD Father 42  . Cancer Mother   . Alzheimer's disease Mother   . Colon cancer Neg Hx     CURRENT MEDICATIONS:  Current Outpatient Medications  Medication Sig Dispense Refill  . amLODipine (NORVASC) 10 MG tablet Take 1 tablet by mouth daily.    . carvedilol (COREG) 6.25 MG tablet Take 1 tablet (6.25 mg total) by mouth 2 (two) times daily. 180 tablet 3  . Cholecalciferol (VITAMIN D3) 25 MCG (1000 UT) CAPS Take 1 capsule by mouth daily.    . Cyanocobalamin (VITAMIN B12) 1000 MCG TBCR Take 1,000 mcg by mouth daily.     . Multiple Vitamin (MULITIVITAMIN WITH MINERALS) TABS Take 1 tablet by mouth daily.    Marland Kitchen omeprazole (PRILOSEC) 20 MG capsule 1 PO 30 MINS PRIOR TO BREAKFAST. (Patient taking differently: Take 20 mg by mouth daily.) 90 capsule 3  . sodium bicarbonate 325 MG tablet Take 325 mg by mouth 3 (three) times daily.    . tamsulosin (FLOMAX) 0.4 MG CAPS capsule Take 0.4 mg by mouth daily.     Marland Kitchen venetoclax  (VENCLEXTA) 100 MG tablet TAKE 2 TABLETS (200 MG TOTAL) BY MOUTH DAILY 60 tablet 3   No current facility-administered medications for this visit.    ALLERGIES:  No Known Allergies  PHYSICAL EXAM:  Performance status (ECOG): 1 - Symptomatic but completely ambulatory  Vitals:   09/29/20 1412  BP: 124/77  Pulse: 75  Resp: 16  Temp: 98.6 F (37 C)  SpO2: 94%   Wt Readings from Last 3 Encounters:  09/29/20 232 lb 3.2 oz (105.3 kg)  09/09/20 222 lb 9.6 oz (101 kg)  08/30/20 229 lb 0.9 oz (103.9 kg)   Physical Exam Vitals reviewed.  Constitutional:  Appearance: Normal appearance.  Cardiovascular:     Rate and Rhythm: Normal rate and regular rhythm.     Pulses: Normal pulses.     Heart sounds: Normal heart sounds.  Pulmonary:     Effort: Pulmonary effort is normal.     Breath sounds: Normal breath sounds.  Chest:  Breasts:     Right: No axillary adenopathy or supraclavicular adenopathy.     Left: No axillary adenopathy or supraclavicular adenopathy.    Abdominal:     Palpations: Abdomen is soft. There is no hepatomegaly, splenomegaly or mass.     Tenderness: There is no abdominal tenderness.     Hernia: No hernia is present.  Musculoskeletal:     Right lower leg: Edema present.     Left lower leg: Edema present.  Lymphadenopathy:     Cervical: No cervical adenopathy.     Upper Body:     Right upper body: No supraclavicular, axillary or pectoral adenopathy.     Left upper body: No supraclavicular, axillary or pectoral adenopathy.  Neurological:     General: No focal deficit present.     Mental Status: He is alert and oriented to person, place, and time.  Psychiatric:        Mood and Affect: Mood normal.        Behavior: Behavior normal.     LABORATORY DATA:  I have reviewed the labs as listed.  CBC Latest Ref Rng & Units 09/29/2020 08/30/2020 07/27/2020  WBC 4.0 - 10.5 K/uL 3.1(L) 2.5(L) 3.1(L)  Hemoglobin 13.0 - 17.0 g/dL 10.9(L) 11.4(L) 11.2(L)  Hematocrit  39.0 - 52.0 % 31.8(L) 33.4(L) 33.7(L)  Platelets 150 - 400 K/uL 86(L) 78(L) 84(L)   CMP Latest Ref Rng & Units 09/29/2020 08/30/2020 07/27/2020  Glucose 70 - 99 mg/dL 98 94 107(H)  BUN 8 - 23 mg/dL 47(H) 50(H) 53(H)  Creatinine 0.61 - 1.24 mg/dL 4.22(H) 4.45(H) 4.52(H)  Sodium 135 - 145 mmol/L 138 139 139  Potassium 3.5 - 5.1 mmol/L 4.1 4.2 3.9  Chloride 98 - 111 mmol/L 110 108 109  CO2 22 - 32 mmol/L 21(L) 21(L) 20(L)  Calcium 8.9 - 10.3 mg/dL 8.7(L) 8.5(L) 8.7(L)  Total Protein 6.5 - 8.1 g/dL 6.2(L) 6.4(L) 6.3(L)  Total Bilirubin 0.3 - 1.2 mg/dL 1.0 0.9 0.6  Alkaline Phos 38 - 126 U/L 57 52 57  AST 15 - 41 U/L 16 13(L) 13(L)  ALT 0 - 44 U/L 14 13 12      Component Value Date/Time   RBC 3.18 (L) 09/29/2020 1217   MCV 100.0 09/29/2020 1217   MCH 34.3 (H) 09/29/2020 1217   MCHC 34.3 09/29/2020 1217   RDW 13.2 09/29/2020 1217   LYMPHSABS 0.4 (L) 09/29/2020 1217   MONOABS 0.6 09/29/2020 1217   EOSABS 0.0 09/29/2020 1217   BASOSABS 0.0 09/29/2020 1217   Lab Results  Component Value Date   LDH 134 09/29/2020   LDH 132 08/30/2020   LDH 123 07/27/2020    DIAGNOSTIC IMAGING:  I have independently reviewed the scans and discussed with the patient. No results found.   ASSESSMENT:  1. CLL, Rai stage IV: -BMBX on 08/10/2016 shows bone marrow involvement by CLL. -He also had bulky diffuse adenopathy with progressive thrombocytopenia. -Imbruvica 420 mg from 08/19/2016 through May 2018, discontinued due to subdural hematoma on 10/14/2016. -Subsequently offered chlorambucil and obinutuzumab. He refused obinutuzumab. Chlorambucil 30 mg every 2 weeks from June 2019 through 11/07/2018 due to severe weakness. -PET scan on 12/01/2019 showed   multiple bilateral enlarged cervical lymph nodes, bilateral axillary and mediastinal adenopathy, prevascular lymph nodes, left lower paratracheal lymph node, minimal adenopathy in the retroperitoneum, external iliac lymph nodes. Spleen measures 16 cm. All lymph  nodes measure less than 5 cm with SUV less than 5. 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 IGHV was mutated. -T p53 mutation is pending. -Venetoclax 10 mg started on 12/02/2019 (dose reduced secondary to Coreg), dose increased to 50 mg daily on 12/12/2019. -Dose increased to 100 mg daily on 12/19/2019, held on 12/25/2019 due to elevated creatinine. -Venetoclax 100 mg daily started back on 01/15/2020.Dose increased to 200 mg daily on 07/01/2020.  2. 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   PLAN:  1. CLL, Rai stage IV: -He is tolerating venetoclax 200 mg daily very well. - He had an episode of diarrhea when he received Evusheld. - Reviewed his labs today which showed white count is 3.1 with normal ANC.  Platelet count is 86.  LFTs are normal.  Creatinine has improved to 4.22. - I have recommended increasing venetoclax to 300 mg daily.  We have sent a new prescription.  He will call us back if he develops any adverse effects. - Otherwise I will see him back in 4 weeks with repeat labs. - We will consider repeating imaging in the form of PET scan at some point.  2. CKD: -Creatinine improved to 4.22. - He has follow-up with Dr. Marval Regal on 09/30/2020.  3. Tumor lysis prophylaxis: -Uric acid is 6.1.  Allopurinol was discontinued.  4.Normocytic anemia: -Last Venofer on 07/29/2020.  Hemoglobin today is 10.9.  Orders placed this encounter:  Orders Placed This Encounter  Procedures  . Uric acid  . Lactate dehydrogenase  . CBC with Differential/Platelet  . Comprehensive metabolic panel     Derek Jack, MD [place] [phone]   I, Milinda Antis, am acting as a scribe for Dr. Sanda Linger.  I, Derek Jack MD, have reviewed the above documentation for accuracy and completeness, and I agree with the above.

## 2020-09-30 ENCOUNTER — Other Ambulatory Visit (HOSPITAL_COMMUNITY): Payer: Self-pay

## 2020-09-30 ENCOUNTER — Encounter: Payer: Self-pay | Admitting: Internal Medicine

## 2020-09-30 ENCOUNTER — Ambulatory Visit: Payer: Medicare HMO | Admitting: Internal Medicine

## 2020-09-30 DIAGNOSIS — K2270 Barrett's esophagus without dysplasia: Secondary | ICD-10-CM | POA: Diagnosis not present

## 2020-09-30 DIAGNOSIS — N179 Acute kidney failure, unspecified: Secondary | ICD-10-CM | POA: Diagnosis not present

## 2020-09-30 DIAGNOSIS — D126 Benign neoplasm of colon, unspecified: Secondary | ICD-10-CM | POA: Insufficient documentation

## 2020-09-30 DIAGNOSIS — K219 Gastro-esophageal reflux disease without esophagitis: Secondary | ICD-10-CM | POA: Insufficient documentation

## 2020-09-30 DIAGNOSIS — Z8616 Personal history of COVID-19: Secondary | ICD-10-CM | POA: Diagnosis not present

## 2020-09-30 DIAGNOSIS — D696 Thrombocytopenia, unspecified: Secondary | ICD-10-CM | POA: Diagnosis not present

## 2020-09-30 DIAGNOSIS — N184 Chronic kidney disease, stage 4 (severe): Secondary | ICD-10-CM | POA: Diagnosis not present

## 2020-09-30 DIAGNOSIS — N12 Tubulo-interstitial nephritis, not specified as acute or chronic: Secondary | ICD-10-CM | POA: Diagnosis not present

## 2020-09-30 DIAGNOSIS — I770 Arteriovenous fistula, acquired: Secondary | ICD-10-CM | POA: Diagnosis not present

## 2020-09-30 DIAGNOSIS — C9110 Chronic lymphocytic leukemia of B-cell type not having achieved remission: Secondary | ICD-10-CM | POA: Diagnosis not present

## 2020-09-30 DIAGNOSIS — I129 Hypertensive chronic kidney disease with stage 1 through stage 4 chronic kidney disease, or unspecified chronic kidney disease: Secondary | ICD-10-CM | POA: Diagnosis not present

## 2020-09-30 DIAGNOSIS — N200 Calculus of kidney: Secondary | ICD-10-CM | POA: Diagnosis not present

## 2020-09-30 NOTE — Patient Instructions (Signed)
I am happy to hear you are doing well.  Continue on omeprazole daily for Barrett's esophagus.  We will tentatively plan on repeat EGD next year.  We may consider performing a colonoscopy at the same time given your history of polyps though we can discuss this further in a year on follow-up visit.  At [facility] we value your feedback. You may receive a survey about your visit today. Please share your experience as we strive to create trusting relationships with our patients to provide genuine, compassionate, quality care.  We appreciate your understanding and patience as we review any laboratory studies, imaging, and other diagnostic tests that are ordered as we care for you. Our office policy is 5 business days for review of these results, and any emergent or urgent results are addressed in a timely manner for your best interest. If you do not hear from our office in 1 week, please contact us.   We also encourage the use of MyChart, which contains your medical information for your review as well. If you are not enrolled in this feature, an access code is on this after visit summary for your convenience. Thank you for allowing [place] to be involved in your care.  It was great to see you today!  I hope you have a great rest of your spring!!    Elon Alas. Abbey Chatters, D.O. Gastroenterology and Hepatology [facility]

## 2020-09-30 NOTE — Progress Notes (Signed)
Referring Provider: [name], MD Primary Care Physician:  [name], MD Primary GI:  Dr. Abbey Chatters  Chief Complaint  Patient presents with  . abnormal PET scan    F/u, doing ok    HPI:   Andrew [name] is a 80 y.o. male who presents to clinic today for follow-up visit he has a history of chronic reflux well controlled on omeprazole 20 mg daily.  Last EGD 2020 did show long segment Barrett's esophagus, biopsies proven.  No dysplasia noted.  Patient denies any breakthrough symptoms.  No dysphagia odynophagia.  No epigastric pain.  No melena hematochezia.  Last colonoscopy 2013 with multiple tubular adenomas removed.  No family history of colorectal malignancy.  Past Medical History:  Diagnosis Date  . Arthritis   . Ascending aortic aneurysm ([place])   . CKD (chronic kidney disease), stage IV ([place])    followed by [facility]  . CLL (chronic lymphocytic leukemia) ([place])   . Complication of anesthesia    "wild when I woke up "  . History of kidney stones    passed   . Hypertension   . NICM (nonischemic cardiomyopathy) ([place])    a. cath 2007, EF 35%, minimal CAD. b. EF normal 2018.  Marland Kitchen PAF (paroxysmal atrial fibrillation) ([place])   . Pinched nerve in neck   . Pneumonia   . SDH (subdural hematoma) ([place])   . Thrombocytopenia ([place])     Past Surgical History:  Procedure Laterality Date  . AV FISTULA PLACEMENT Left 04/30/2020   Procedure: LEFT ARM BRACHIOCEPHALIC ARTERIOVENOUS (AV) FISTULA CREATION;  Surgeon: Serafina Mitchell, MD;  Location: [place];  Service: Vascular;  Laterality: Left;  . BIOPSY  08/05/2018   Procedure: BIOPSY;  Surgeon: Danie Binder, MD;  [facility];  Service: Endoscopy;;  gastric   . Bone marrow biospy Bilateral   . CATARACT EXTRACTION, BILATERAL    . COLONOSCOPY  07/10/2011   Procedure: COLONOSCOPY;  Surgeon: Dorothyann Peng, MD;  [facility];  Service: Endoscopy;  Laterality: N/A;  10:30 AM  . CRANIOTOMY N/A 10/14/2016   Procedure:  CRANIOTOMY HEMATOMA EVACUATION SUBDURAL;  Surgeon: Jovita Gamma, MD;  Location: [place];  Service: Neurosurgery;  Laterality: N/A;  CRANIOTOMY HEMATOMA EVACUATION SUBDURAL  . ESOPHAGOGASTRODUODENOSCOPY N/A 08/05/2018   Procedure: ESOPHAGOGASTRODUODENOSCOPY (EGD);  Surgeon: Danie Binder, MD;  [facility];  Service: Endoscopy;  Laterality: N/A;  3:00pm  . THYROIDECTOMY, PARTIAL    . TONSILLECTOMY      Current Outpatient Medications  Medication Sig Dispense Refill  . amLODipine (NORVASC) 10 MG tablet Take 1 tablet by mouth daily.    . carvedilol (COREG) 6.25 MG tablet Take 1 tablet (6.25 mg total) by mouth 2 (two) times daily. 180 tablet 3  . Cholecalciferol (VITAMIN D3) 25 MCG (1000 UT) CAPS Take 1 capsule by mouth daily.    . Cyanocobalamin (VITAMIN B12) 1000 MCG TBCR Take 1,000 mcg by mouth daily.     . Multiple Vitamin (MULITIVITAMIN WITH MINERALS) TABS Take 1 tablet by mouth daily.    Marland Kitchen omeprazole (PRILOSEC) 20 MG capsule 1 PO 30 MINS PRIOR TO BREAKFAST. (Patient taking differently: Take 20 mg by mouth daily.) 90 capsule 3  . sodium bicarbonate 325 MG tablet Take 325 mg by mouth 3 (three) times daily.    . tamsulosin (FLOMAX) 0.4 MG CAPS capsule Take 0.4 mg by mouth daily.     Marland Kitchen venetoclax (VENCLEXTA) 100 MG tablet Take  3 tablets (300 mg total) by mouth daily. 90 tablet 3   No current facility-administered medications for this visit.    Allergies as of 09/30/2020  . (No Known Allergies)    Family History  Problem Relation Age of Onset  . CAD Father 29  . Cancer Mother   . Alzheimer's disease Mother   . Colon cancer Neg Hx     Social History   Socioeconomic History  . Marital status: Married    Spouse name: Not on file  . Number of children: Not on file  . Years of education: Not on file  . Highest education level: Not on file  Occupational History  . Not on file  Tobacco Use  . Smoking status: Former Smoker    Packs/day: 1.00    Years: 15.00    Pack  years: 15.00    Quit date: 01/26/1971    Years since quitting: 49.7  . Smokeless tobacco: Never Used  Vaping Use  . Vaping Use: Never used  Substance and Sexual Activity  . Alcohol use: No  . Drug use: No  . Sexual activity: Not on file  Other Topics Concern  . Not on file  Social History Narrative  . Not on file   Social Determinants of Health   Financial Resource Strain: Low Risk   . Difficulty of Paying Living Expenses: Not hard at all  Food Insecurity: No Food Insecurity  . Worried About Charity fundraiser in the Last Year: Never true  . Ran Out of Food in the Last Year: Never true  Transportation Needs: No Transportation Needs  . Lack of Transportation (Medical): No  . Lack of Transportation (Non-Medical): No  Physical Activity: Inactive  . Days of Exercise per Week: 0 days  . Minutes of Exercise per Session: 0 min  Stress: No Stress Concern Present  . Feeling of Stress : Not at all  Social Connections: Moderately Integrated  . Frequency of Communication with Friends and Family: More than three times a week  . Frequency of Social Gatherings with Friends and Family: More than three times a week  . Attends Religious Services: More than 4 times per year  . Active Member of Clubs or Organizations: No  . Attends Archivist Meetings: Never  . Marital Status: Married    Subjective: Review of Systems  Constitutional: Negative for chills and fever.  HENT: Negative for congestion and hearing loss.   Eyes: Negative for blurred vision and double vision.  Respiratory: Negative for cough and shortness of breath.   Cardiovascular: Negative for chest pain and palpitations.  Gastrointestinal: Negative for abdominal pain, blood in stool, constipation, diarrhea, heartburn, melena and vomiting.  Genitourinary: Negative for dysuria and urgency.  Musculoskeletal: Negative for joint pain and myalgias.  Skin: Negative for itching and rash.  Neurological: Negative for  dizziness and headaches.  Psychiatric/Behavioral: Negative for depression. The patient is not nervous/anxious.      Objective: BP 124/67   Pulse 78   Temp (!) 97.3 F (36.3 C) (Temporal)   Ht 6\' 2"  (1.88 m)   Wt 231 lb 12.8 oz (105.1 kg)   BMI 29.76 kg/m  Physical Exam Constitutional:      Appearance: Normal appearance.  HENT:     Head: Normocephalic and atraumatic.  Eyes:     Extraocular Movements: Extraocular movements intact.     Conjunctiva/sclera: Conjunctivae normal.  Cardiovascular:     Rate and Rhythm: Normal rate and regular rhythm.  Pulmonary:  Effort: Pulmonary effort is normal.     Breath sounds: Normal breath sounds.  Abdominal:     General: Bowel sounds are normal.     Palpations: Abdomen is soft.  Musculoskeletal:        General: Normal range of motion.     Cervical back: Normal range of motion and neck supple.  Skin:    General: Skin is warm.  Neurological:     General: No focal deficit present.     Mental Status: He is alert and oriented to person, place, and time.  Psychiatric:        Mood and Affect: Mood normal.        Behavior: Behavior normal.      Assessment: *Chronic reflux-well-controlled on omeprazole *Barrett's esophagus without dysplasia *Adenomatous colon polyp  Plan: Patient's reflux well controlled on omeprazole 20 mg daily.  We will continue.  Discussed Barrett's esophagus in depth with him today.  We will tentatively plan on surveillance EGD next year though we will discuss further on follow-up visit.  Last colonoscopy 2013 with multiple tubular adenomas removed.  We can consider doing 1 more colonoscopy at the same time of EGD though we will discuss risk versus benefits on follow-up visit in 1 year.  09/30/2020 11:11 AM   Disclaimer: This note was dictated with voice recognition software. Similar sounding words can inadvertently be transcribed and may not be corrected upon review.

## 2020-10-05 ENCOUNTER — Other Ambulatory Visit (HOSPITAL_COMMUNITY): Payer: Self-pay

## 2020-10-05 DIAGNOSIS — D044 Carcinoma in situ of skin of scalp and neck: Secondary | ICD-10-CM | POA: Diagnosis not present

## 2020-10-05 DIAGNOSIS — L570 Actinic keratosis: Secondary | ICD-10-CM | POA: Diagnosis not present

## 2020-10-05 DIAGNOSIS — X32XXXD Exposure to sunlight, subsequent encounter: Secondary | ICD-10-CM | POA: Diagnosis not present

## 2020-10-26 DIAGNOSIS — E7800 Pure hypercholesterolemia, unspecified: Secondary | ICD-10-CM | POA: Diagnosis not present

## 2020-10-26 DIAGNOSIS — I10 Essential (primary) hypertension: Secondary | ICD-10-CM | POA: Diagnosis not present

## 2020-10-28 ENCOUNTER — Inpatient Hospital Stay (HOSPITAL_COMMUNITY): Payer: Medicare HMO | Attending: Hematology

## 2020-10-28 ENCOUNTER — Ambulatory Visit (HOSPITAL_COMMUNITY): Payer: Medicare HMO | Admitting: Hematology

## 2020-10-28 ENCOUNTER — Other Ambulatory Visit: Payer: Self-pay

## 2020-10-28 DIAGNOSIS — C9110 Chronic lymphocytic leukemia of B-cell type not having achieved remission: Secondary | ICD-10-CM

## 2020-10-28 DIAGNOSIS — D649 Anemia, unspecified: Secondary | ICD-10-CM | POA: Insufficient documentation

## 2020-10-28 DIAGNOSIS — R600 Localized edema: Secondary | ICD-10-CM | POA: Diagnosis not present

## 2020-10-28 DIAGNOSIS — I10 Essential (primary) hypertension: Secondary | ICD-10-CM | POA: Diagnosis not present

## 2020-10-28 DIAGNOSIS — Z87891 Personal history of nicotine dependence: Secondary | ICD-10-CM | POA: Insufficient documentation

## 2020-10-28 DIAGNOSIS — N189 Chronic kidney disease, unspecified: Secondary | ICD-10-CM | POA: Insufficient documentation

## 2020-10-28 LAB — LACTATE DEHYDROGENASE: LDH: 134 U/L (ref 98–192)

## 2020-10-28 LAB — COMPREHENSIVE METABOLIC PANEL
ALT: 14 U/L (ref 0–44)
AST: 16 U/L (ref 15–41)
Albumin: 4.1 g/dL (ref 3.5–5.0)
Alkaline Phosphatase: 63 U/L (ref 38–126)
Anion gap: 8 (ref 5–15)
BUN: 45 mg/dL — ABNORMAL HIGH (ref 8–23)
CO2: 21 mmol/L — ABNORMAL LOW (ref 22–32)
Calcium: 8.5 mg/dL — ABNORMAL LOW (ref 8.9–10.3)
Chloride: 108 mmol/L (ref 98–111)
Creatinine, Ser: 4.43 mg/dL — ABNORMAL HIGH (ref 0.61–1.24)
GFR, Estimated: 13 mL/min — ABNORMAL LOW (ref >60)
Glucose, Bld: 110 mg/dL — ABNORMAL HIGH (ref 70–99)
Potassium: 4.2 mmol/L (ref 3.5–5.1)
Sodium: 137 mmol/L (ref 135–145)
Total Bilirubin: 0.8 mg/dL (ref 0.3–1.2)
Total Protein: 6.3 g/dL — ABNORMAL LOW (ref 6.5–8.1)

## 2020-10-28 LAB — CBC WITH DIFFERENTIAL/PLATELET
Abs Immature Granulocytes: 0 10*3/uL (ref 0.00–0.07)
Basophils Absolute: 0 10*3/uL (ref 0.0–0.1)
Basophils Relative: 0 %
Eosinophils Absolute: 0 10*3/uL (ref 0.0–0.5)
Eosinophils Relative: 1 %
HCT: 33.6 % — ABNORMAL LOW (ref 39.0–52.0)
Hemoglobin: 11.3 g/dL — ABNORMAL LOW (ref 13.0–17.0)
Immature Granulocytes: 0 %
Lymphocytes Relative: 14 %
Lymphs Abs: 0.4 10*3/uL — ABNORMAL LOW (ref 0.7–4.0)
MCH: 34.1 pg — ABNORMAL HIGH (ref 26.0–34.0)
MCHC: 33.6 g/dL (ref 30.0–36.0)
MCV: 101.5 fL — ABNORMAL HIGH (ref 80.0–100.0)
Monocytes Absolute: 0.5 10*3/uL (ref 0.1–1.0)
Monocytes Relative: 16 %
Neutro Abs: 1.9 10*3/uL (ref 1.7–7.7)
Neutrophils Relative %: 69 %
Platelets: 80 10*3/uL — ABNORMAL LOW (ref 150–400)
RBC: 3.31 MIL/uL — ABNORMAL LOW (ref 4.22–5.81)
RDW: 13.2 % (ref 11.5–15.5)
WBC: 2.8 10*3/uL — ABNORMAL LOW (ref 4.0–10.5)
nRBC: 0 % (ref 0.0–0.2)

## 2020-10-28 LAB — URIC ACID: Uric Acid, Serum: 5.8 mg/dL (ref 3.7–8.6)

## 2020-11-01 ENCOUNTER — Other Ambulatory Visit (HOSPITAL_COMMUNITY): Payer: Self-pay

## 2020-11-03 ENCOUNTER — Encounter (HOSPITAL_COMMUNITY): Payer: Self-pay | Admitting: Hematology

## 2020-11-03 ENCOUNTER — Other Ambulatory Visit (HOSPITAL_COMMUNITY): Payer: Self-pay

## 2020-11-03 ENCOUNTER — Telehealth (HOSPITAL_COMMUNITY): Payer: Self-pay | Admitting: Pharmacy Technician

## 2020-11-03 NOTE — Telephone Encounter (Signed)
Oral Oncology Patient Advocate Encounter  Was successful in securing patient a $8000 grant from [employer] to provide copayment coverage for Venclexta.  This will keep the out of pocket expense at $0.     [employer] ID: [id number]  I have spoken with the patient.   The billing information is as follows and has been shared with [place].    RxBin: [id number] PCN: [id number] Member ID: [id number] Group ID: [id number] Dates of Eligibility: 10/20/20 through 10/19/21  Fund:  Chronic Lymphocytic [place] Patient [place] Phone [phone] Fax [phone] 11/03/2020 12:04 PM'

## 2020-11-04 NOTE — Progress Notes (Signed)
[facility] [address], [place] [address]   CLINIC:  Medical Oncology/Hematology  PCP:  [name], MD [address] [place] [address]  [phone]  REASON FOR VISIT:  Follow-up for CLL  PRIOR THERAPY:  1. Imbruvica 420 mg from 08/19/2016 through 09/2016. 2. Chlorambucil 30 mg from 10/2017 through 11/07/2018.  CURRENT THERAPY: Venetoclax 200 mg daily  INTERVAL HISTORY:  Andrew [name], a 80 y.o. male, returns for routine follow-up for his CLL. Andrew [name] was last seen on 09/29/2020.  Today he reports feeling well. He reports leg swelling which worsens through out the day. His PCP cut back on amlodipine which helped reduce the swelling. He denies any other changes to his medications. His fatigue levels are at baseline, and he denies any diarrhea.   REVIEW OF SYSTEMS:  Review of Systems  Constitutional:  Positive for fatigue (50%). Negative for appetite change.  Cardiovascular:  Positive for leg swelling (feet and ankles).  Gastrointestinal:  Negative for diarrhea.  Neurological:  Positive for numbness (feet).  All other systems reviewed and are negative.  PAST MEDICAL/SURGICAL HISTORY:  Past Medical History:  Diagnosis Date   Arthritis    Ascending aortic aneurysm (HCC)    CKD (chronic kidney disease), stage IV ([facility])    followed by [facility]   CLL (chronic lymphocytic leukemia) ([facility])    Complication of anesthesia    "wild when I woke up "   History of kidney stones    passed    Hypertension    NICM (nonischemic cardiomyopathy) ([facility])    a. cath 2007, EF 35%, minimal CAD. b. EF normal 2018.   PAF (paroxysmal atrial fibrillation) (HCC)    Pinched nerve in neck    Pneumonia    SDH (subdural hematoma) (HCC)    Thrombocytopenia (HCC)    Past Surgical History:  Procedure Laterality Date   AV FISTULA PLACEMENT Left 04/30/2020   Procedure: LEFT ARM BRACHIOCEPHALIC ARTERIOVENOUS (AV) FISTULA CREATION;  Surgeon: Serafina Mitchell,  MD;  Location: [facility];  Service: Vascular;  Laterality: Left;   BIOPSY  08/05/2018   Procedure: BIOPSY;  Surgeon: Danie Binder, MD;  [facility];  Service: Endoscopy;;  gastric    Bone marrow biospy Bilateral    CATARACT EXTRACTION, BILATERAL     COLONOSCOPY  07/10/2011   Procedure: COLONOSCOPY;  Surgeon: Dorothyann Peng, MD;  [facility];  Service: Endoscopy;  Laterality: N/A;  10:30 AM   CRANIOTOMY N/A 10/14/2016   Procedure: CRANIOTOMY HEMATOMA EVACUATION SUBDURAL;  Surgeon: Jovita Gamma, MD;  Location: [facility];  Service: Neurosurgery;  Laterality: N/A;  CRANIOTOMY HEMATOMA EVACUATION SUBDURAL   ESOPHAGOGASTRODUODENOSCOPY N/A 08/05/2018   Procedure: ESOPHAGOGASTRODUODENOSCOPY (EGD);  Surgeon: Danie Binder, MD;  [facility];  Service: Endoscopy;  Laterality: N/A;  3:00pm   THYROIDECTOMY, PARTIAL     TONSILLECTOMY      SOCIAL HISTORY:  Social History   Socioeconomic History   Marital status: Married    Spouse name: Not on file   Number of children: Not on file   Years of education: Not on file   Highest education level: Not on file  Occupational History   Not on file  Tobacco Use   Smoking status: Former    Packs/day: 1.00    Years: 15.00    Pack years: 15.00    Types: Cigarettes    Quit date: 01/26/1971    Years since  quitting: 49.8   Smokeless tobacco: Never  Vaping Use   Vaping Use: Never used  Substance and Sexual Activity   Alcohol use: No   Drug use: No   Sexual activity: Not on file  Other Topics Concern   Not on file  Social History Narrative   Not on file   Social Determinants of Health   Financial Resource Strain: Low Risk    Difficulty of Paying Living Expenses: Not hard at all  Food Insecurity: No Food Insecurity   Worried About Charity fundraiser in the Last Year: Never true   [place] in the Last Year: Never true  Transportation Needs: No Transportation Needs   Lack of Transportation (Medical): No   Lack  of Transportation (Non-Medical): No  Physical Activity: Inactive   Days of Exercise per Week: 0 days   Minutes of Exercise per Session: 0 min  Stress: No Stress Concern Present   Feeling of Stress : Not at all  Social Connections: Moderately Integrated   Frequency of Communication with Friends and Family: More than three times a week   Frequency of Social Gatherings with Friends and Family: More than three times a week   Attends Religious Services: More than 4 times per year   Active Member of [employer] or Organizations: No   Attends Music therapist: Never   Marital Status: Married  Human resources officer Violence: Not At Risk   Fear of Current or Ex-Partner: No   Emotionally Abused: No   Physically Abused: No   Sexually Abused: No    FAMILY HISTORY:  Family History  Problem Relation Age of Onset   CAD Father 29   Cancer Mother    Alzheimer's disease Mother    Colon cancer Neg Hx     CURRENT MEDICATIONS:  Current Outpatient Medications  Medication Sig Dispense Refill   amLODipine (NORVASC) 10 MG tablet Take 1 tablet by mouth daily.     carvedilol (COREG) 6.25 MG tablet Take 1 tablet (6.25 mg total) by mouth 2 (two) times daily. 180 tablet 3   Cholecalciferol (VITAMIN D3) 25 MCG (1000 UT) CAPS Take 1 capsule by mouth daily.     Cyanocobalamin (VITAMIN B12) 1000 MCG TBCR Take 1,000 mcg by mouth daily.      Multiple Vitamin (MULITIVITAMIN WITH MINERALS) TABS Take 1 tablet by mouth daily.     omeprazole (PRILOSEC) 20 MG capsule 1 PO 30 MINS PRIOR TO BREAKFAST. (Patient taking differently: Take 20 mg by mouth daily.) 90 capsule 3   sodium bicarbonate 325 MG tablet Take 325 mg by mouth 3 (three) times daily.     tamsulosin (FLOMAX) 0.4 MG CAPS capsule Take 0.4 mg by mouth daily.      venetoclax (VENCLEXTA) 100 MG tablet Take 3 tablets (300 mg total) by mouth daily. 90 tablet 3   No current facility-administered medications for this visit.    ALLERGIES:  No Known  Allergies  PHYSICAL EXAM:  Performance status (ECOG): 1 - Symptomatic but completely ambulatory  There were no vitals filed for this visit. Wt Readings from Last 3 Encounters:  09/30/20 231 lb 12.8 oz (105.1 kg)  09/29/20 232 lb 3.2 oz (105.3 kg)  09/09/20 222 lb 9.6 oz (101 kg)   Physical Exam Vitals reviewed.  Constitutional:      Appearance: Normal appearance.  Cardiovascular:     Rate and Rhythm: Normal rate and regular rhythm.     Pulses: Normal pulses.  Heart sounds: Normal heart sounds.  Pulmonary:     Effort: Pulmonary effort is normal.     Breath sounds: Normal breath sounds.  Musculoskeletal:     Right lower leg: Edema (trace) present.     Left lower leg: Edema (trace) present.  Neurological:     General: No focal deficit present.     Mental Status: He is alert and oriented to person, place, and time.  Psychiatric:        Mood and Affect: Mood normal.        Behavior: Behavior normal.    LABORATORY DATA:  I have reviewed the labs as listed.  CBC Latest Ref Rng & Units 10/28/2020 09/29/2020 08/30/2020  WBC 4.0 - 10.5 K/uL 2.8(L) 3.1(L) 2.5(L)  Hemoglobin 13.0 - 17.0 g/dL 11.3(L) 10.9(L) 11.4(L)  Hematocrit 39.0 - 52.0 % 33.6(L) 31.8(L) 33.4(L)  Platelets 150 - 400 K/uL 80(L) 86(L) 78(L)   CMP Latest Ref Rng & Units 10/28/2020 09/29/2020 08/30/2020  Glucose 70 - 99 mg/dL 110(H) 98 94  BUN 8 - 23 mg/dL 45(H) 47(H) 50(H)  Creatinine 0.61 - 1.24 mg/dL 4.43(H) 4.22(H) 4.45(H)  Sodium 135 - 145 mmol/L 137 138 139  Potassium 3.5 - 5.1 mmol/L 4.2 4.1 4.2  Chloride 98 - 111 mmol/L 108 110 108  CO2 22 - 32 mmol/L 21(L) 21(L) 21(L)  Calcium 8.9 - 10.3 mg/dL 8.5(L) 8.7(L) 8.5(L)  Total Protein 6.5 - 8.1 g/dL 6.3(L) 6.2(L) 6.4(L)  Total Bilirubin 0.3 - 1.2 mg/dL 0.8 1.0 0.9  Alkaline Phos 38 - 126 U/L 63 57 52  AST 15 - 41 U/L 16 16 13(L)  ALT 0 - 44 U/L _0 Component Value Date/Time   RBC 3.31 (L) 10/28/2020 1104   MCV 101.5 (H) 10/28/2020 1104   MCH 34.1 (H)  10/28/2020 1104   MCHC 33.6 10/28/2020 1104   RDW 13.2 10/28/2020 1104   LYMPHSABS 0.4 (L) 10/28/2020 1104   MONOABS 0.5 10/28/2020 1104   EOSABS 0.0 10/28/2020 1104   BASOSABS 0.0 10/28/2020 1104    DIAGNOSTIC IMAGING:  I have independently reviewed the scans and discussed with the patient. No results found.   ASSESSMENT:  1.  CLL, Rai stage IV: -BMBX on 08/10/2016 shows bone marrow involvement by CLL. -He also had bulky diffuse adenopathy with progressive thrombocytopenia. -Imbruvica 420 mg from 08/19/2016 through May 2018, discontinued due to subdural hematoma on 10/14/2016. -Subsequently offered chlorambucil and obinutuzumab.  He refused obinutuzumab.  Chlorambucil 30 mg every 2 weeks from June 2019 through 11/07/2018 due to severe weakness. -PET scan on 12/01/2019 showed multiple bilateral enlarged cervical lymph nodes, bilateral axillary and mediastinal adenopathy, prevascular lymph nodes, left lower paratracheal lymph node, minimal adenopathy in the retroperitoneum, external iliac lymph nodes.  Spleen measures 16 cm.  All lymph nodes measure less than 5 cm with SUV less than 5. 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 -T p53 mutation is pending. -Venetoclax 10 mg started on 12/02/2019 (dose reduced secondary to Coreg), dose increased to 50 mg daily on 12/12/2019. -Dose increased to 100 mg daily on 12/19/2019, held on 12/25/2019 due to elevated creatinine. -Venetoclax 100 mg daily started back on 01/15/2020.  Dose increased to 200 mg daily on 07/01/2020.  Dose increased to 300 mg daily on 09/29/2020.   2.  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   PLAN:  1.  CLL, Rai stage IV: - Venetoclax dose was increased to 300 mg daily on 09/29/2020. - He has tolerated very well.  Fatigue is stable. - Ankle swelling has gotten worse, which has improved after amlodipine cut  down to 5 mg daily. - CBC shows white count 2.8 and platelet count 80.  ANC was 1.9. - We will continue same dose of venetoclax at 300 mg daily. - I have recommended follow-up in 6 weeks with repeat labs.  I plan to repeat PET CT scan prior to next visit.   2.  CKD: - Latest creatinine has slightly increased to 4.43 from 4.22 previously. - Continue follow-up with Dr. Marval Regal.   3.  Tumor lysis prophylaxis: - Uric acid is staying in the normal range at 5.8 after allopurinol discontinued.   4.  Normocytic anemia: - Last Venofer on 07/29/2020.  Hemoglobin today is 11.3.  Orders placed this encounter:  No orders of the defined types were placed in this encounter.    Derek Jack, MD [facility] [phone]   I, Thana Ates, am acting as a scribe for Dr. Derek Jack.  I, Derek Jack MD, have reviewed the above documentation for accuracy and completeness, and I agree with the above.

## 2020-11-08 ENCOUNTER — Inpatient Hospital Stay (HOSPITAL_COMMUNITY): Payer: Medicare HMO | Admitting: Hematology

## 2020-11-08 ENCOUNTER — Other Ambulatory Visit: Payer: Self-pay

## 2020-11-08 VITALS — BP 129/70 | HR 73 | Temp 96.9°F | Resp 20 | Wt 226.5 lb

## 2020-11-08 DIAGNOSIS — D649 Anemia, unspecified: Secondary | ICD-10-CM | POA: Diagnosis not present

## 2020-11-08 DIAGNOSIS — C9110 Chronic lymphocytic leukemia of B-cell type not having achieved remission: Secondary | ICD-10-CM

## 2020-11-08 DIAGNOSIS — Z87891 Personal history of nicotine dependence: Secondary | ICD-10-CM | POA: Diagnosis not present

## 2020-11-08 DIAGNOSIS — N189 Chronic kidney disease, unspecified: Secondary | ICD-10-CM | POA: Diagnosis not present

## 2020-11-08 NOTE — Patient Instructions (Addendum)
[facility] at [facility] Discharge Instructions  You were seen today by Dr. Delton Coombes. He went over your recent results. You will be scheduled for a PET scan prior to your next visit. Dr. Delton Coombes will see you back in 6 weeks for labs and follow up.   Thank you for choosing [facility] at [facility] to provide your oncology and hematology care.  To afford each patient quality time with our provider, please arrive at least 15 minutes before your scheduled appointment time.   If you have a lab appointment with the [facility] please come in thru the Main Entrance and check in at the main information desk  You need to re-schedule your appointment should you arrive 10 or more minutes late.  We strive to give you quality time with our providers, and arriving late affects you and other patients whose appointments are after yours.  Also, if you no show three or more times for appointments you may be dismissed from the clinic at the providers discretion.     Again, thank you for choosing [facility].  Our hope is that these requests will decrease the amount of time that you wait before being seen by our physicians.       _____________________________________________________________  Should you have questions after your visit to [facility], please contact our office at [phone] between the hours of 8:00 a.m. and 4:30 p.m.  Voicemails left after 4:00 p.m. will not be returned until the following business day.  For prescription refill requests, have your pharmacy contact our office and allow 72 hours.    Cancer Center Support Programs:   > Cancer Support Group  2nd Tuesday of the month 1pm-2pm, Journey Room

## 2020-11-11 DIAGNOSIS — R600 Localized edema: Secondary | ICD-10-CM | POA: Diagnosis not present

## 2020-11-11 DIAGNOSIS — I10 Essential (primary) hypertension: Secondary | ICD-10-CM | POA: Diagnosis not present

## 2020-11-25 ENCOUNTER — Other Ambulatory Visit (HOSPITAL_COMMUNITY): Payer: Self-pay

## 2020-11-25 DIAGNOSIS — I10 Essential (primary) hypertension: Secondary | ICD-10-CM | POA: Diagnosis not present

## 2020-11-25 DIAGNOSIS — E7800 Pure hypercholesterolemia, unspecified: Secondary | ICD-10-CM | POA: Diagnosis not present

## 2020-11-30 ENCOUNTER — Other Ambulatory Visit (HOSPITAL_COMMUNITY): Payer: Self-pay

## 2020-11-30 DIAGNOSIS — Z08 Encounter for follow-up examination after completed treatment for malignant neoplasm: Secondary | ICD-10-CM | POA: Diagnosis not present

## 2020-11-30 DIAGNOSIS — Z85828 Personal history of other malignant neoplasm of skin: Secondary | ICD-10-CM | POA: Diagnosis not present

## 2020-11-30 DIAGNOSIS — L570 Actinic keratosis: Secondary | ICD-10-CM | POA: Diagnosis not present

## 2020-11-30 DIAGNOSIS — X32XXXD Exposure to sunlight, subsequent encounter: Secondary | ICD-10-CM | POA: Diagnosis not present

## 2020-12-02 ENCOUNTER — Other Ambulatory Visit (HOSPITAL_COMMUNITY): Payer: Self-pay

## 2020-12-07 ENCOUNTER — Other Ambulatory Visit (HOSPITAL_COMMUNITY): Payer: Medicare HMO

## 2020-12-07 ENCOUNTER — Ambulatory Visit (HOSPITAL_COMMUNITY): Payer: Medicare HMO | Admitting: Hematology

## 2020-12-09 ENCOUNTER — Other Ambulatory Visit (HOSPITAL_COMMUNITY): Payer: Self-pay | Admitting: *Deleted

## 2020-12-09 DIAGNOSIS — C9110 Chronic lymphocytic leukemia of B-cell type not having achieved remission: Secondary | ICD-10-CM

## 2020-12-09 NOTE — Progress Notes (Unsigned)
T

## 2020-12-10 DIAGNOSIS — D696 Thrombocytopenia, unspecified: Secondary | ICD-10-CM | POA: Diagnosis not present

## 2020-12-10 DIAGNOSIS — N179 Acute kidney failure, unspecified: Secondary | ICD-10-CM | POA: Diagnosis not present

## 2020-12-10 DIAGNOSIS — N12 Tubulo-interstitial nephritis, not specified as acute or chronic: Secondary | ICD-10-CM | POA: Diagnosis not present

## 2020-12-10 DIAGNOSIS — N200 Calculus of kidney: Secondary | ICD-10-CM | POA: Diagnosis not present

## 2020-12-10 DIAGNOSIS — Z8616 Personal history of COVID-19: Secondary | ICD-10-CM | POA: Diagnosis not present

## 2020-12-10 DIAGNOSIS — C9110 Chronic lymphocytic leukemia of B-cell type not having achieved remission: Secondary | ICD-10-CM | POA: Diagnosis not present

## 2020-12-10 DIAGNOSIS — I770 Arteriovenous fistula, acquired: Secondary | ICD-10-CM | POA: Diagnosis not present

## 2020-12-10 DIAGNOSIS — N184 Chronic kidney disease, stage 4 (severe): Secondary | ICD-10-CM | POA: Diagnosis not present

## 2020-12-10 DIAGNOSIS — I129 Hypertensive chronic kidney disease with stage 1 through stage 4 chronic kidney disease, or unspecified chronic kidney disease: Secondary | ICD-10-CM | POA: Diagnosis not present

## 2020-12-13 ENCOUNTER — Inpatient Hospital Stay (HOSPITAL_COMMUNITY): Payer: Medicare HMO | Attending: Hematology

## 2020-12-13 ENCOUNTER — Other Ambulatory Visit: Payer: Self-pay

## 2020-12-13 DIAGNOSIS — N189 Chronic kidney disease, unspecified: Secondary | ICD-10-CM | POA: Insufficient documentation

## 2020-12-13 DIAGNOSIS — C9110 Chronic lymphocytic leukemia of B-cell type not having achieved remission: Secondary | ICD-10-CM | POA: Diagnosis not present

## 2020-12-13 DIAGNOSIS — D649 Anemia, unspecified: Secondary | ICD-10-CM | POA: Insufficient documentation

## 2020-12-13 LAB — CBC WITH DIFFERENTIAL/PLATELET
Abs Immature Granulocytes: 0.02 10*3/uL (ref 0.00–0.07)
Basophils Absolute: 0 10*3/uL (ref 0.0–0.1)
Basophils Relative: 0 %
Eosinophils Absolute: 0 10*3/uL (ref 0.0–0.5)
Eosinophils Relative: 1 %
HCT: 33.6 % — ABNORMAL LOW (ref 39.0–52.0)
Hemoglobin: 11.5 g/dL — ABNORMAL LOW (ref 13.0–17.0)
Immature Granulocytes: 1 %
Lymphocytes Relative: 12 %
Lymphs Abs: 0.4 10*3/uL — ABNORMAL LOW (ref 0.7–4.0)
MCH: 33.3 pg (ref 26.0–34.0)
MCHC: 34.2 g/dL (ref 30.0–36.0)
MCV: 97.4 fL (ref 80.0–100.0)
Monocytes Absolute: 0.5 10*3/uL (ref 0.1–1.0)
Monocytes Relative: 15 %
Neutro Abs: 2.6 10*3/uL (ref 1.7–7.7)
Neutrophils Relative %: 71 %
Platelets: 78 10*3/uL — ABNORMAL LOW (ref 150–400)
RBC: 3.45 MIL/uL — ABNORMAL LOW (ref 4.22–5.81)
RDW: 12.5 % (ref 11.5–15.5)
WBC: 3.6 10*3/uL — ABNORMAL LOW (ref 4.0–10.5)
nRBC: 0 % (ref 0.0–0.2)

## 2020-12-13 LAB — LACTATE DEHYDROGENASE: LDH: 126 U/L (ref 98–192)

## 2020-12-13 LAB — COMPREHENSIVE METABOLIC PANEL
ALT: 16 U/L (ref 0–44)
AST: 17 U/L (ref 15–41)
Albumin: 3.8 g/dL (ref 3.5–5.0)
Alkaline Phosphatase: 65 U/L (ref 38–126)
Anion gap: 10 (ref 5–15)
BUN: 57 mg/dL — ABNORMAL HIGH (ref 8–23)
CO2: 24 mmol/L (ref 22–32)
Calcium: 8.5 mg/dL — ABNORMAL LOW (ref 8.9–10.3)
Chloride: 102 mmol/L (ref 98–111)
Creatinine, Ser: 4.66 mg/dL — ABNORMAL HIGH (ref 0.61–1.24)
GFR, Estimated: 12 mL/min — ABNORMAL LOW (ref >60)
Glucose, Bld: 113 mg/dL — ABNORMAL HIGH (ref 70–99)
Potassium: 3.1 mmol/L — ABNORMAL LOW (ref 3.5–5.1)
Sodium: 136 mmol/L (ref 135–145)
Total Bilirubin: 0.8 mg/dL (ref 0.3–1.2)
Total Protein: 6.3 g/dL — ABNORMAL LOW (ref 6.5–8.1)

## 2020-12-13 LAB — URIC ACID: Uric Acid, Serum: 7.9 mg/dL (ref 3.7–8.6)

## 2020-12-14 ENCOUNTER — Other Ambulatory Visit (HOSPITAL_COMMUNITY): Payer: Medicare HMO

## 2020-12-16 ENCOUNTER — Other Ambulatory Visit: Payer: Self-pay

## 2020-12-16 ENCOUNTER — Ambulatory Visit (HOSPITAL_COMMUNITY)
Admission: RE | Admit: 2020-12-16 | Discharge: 2020-12-16 | Disposition: A | Discharge status: Home / Self Care | Payer: Medicare HMO | Source: Ambulatory Visit | Attending: Hematology | Admitting: Hematology

## 2020-12-16 DIAGNOSIS — C9110 Chronic lymphocytic leukemia of B-cell type not having achieved remission: Secondary | ICD-10-CM | POA: Insufficient documentation

## 2020-12-16 DIAGNOSIS — I2510 Atherosclerotic heart disease of native coronary artery without angina pectoris: Secondary | ICD-10-CM | POA: Diagnosis not present

## 2020-12-16 DIAGNOSIS — K449 Diaphragmatic hernia without obstruction or gangrene: Secondary | ICD-10-CM | POA: Diagnosis not present

## 2020-12-16 DIAGNOSIS — N200 Calculus of kidney: Secondary | ICD-10-CM | POA: Diagnosis not present

## 2020-12-16 DIAGNOSIS — M7989 Other specified soft tissue disorders: Secondary | ICD-10-CM | POA: Diagnosis not present

## 2020-12-18 NOTE — Progress Notes (Signed)
[facility] [address] [place] [address]   CLINIC:  Medical Oncology/Hematology  PCP:  [name], MD [address] [place] [address] [phone]   REASON FOR VISIT:  Follow-up for CLL  PRIOR THERAPY:  1. Imbruvica 420 mg from 08/19/2016 through 09/2016. 2. Chlorambucil 30 mg from 10/2017 through 11/07/2018.  NGS Results: not done  CURRENT THERAPY: Venetoclax 200 mg daily  BRIEF ONCOLOGIC HISTORY:  Oncology History  CLL (chronic lymphocytic leukemia) ([place])  03/09/2015 Initial Diagnosis   CLL (chronic lymphocytic leukemia) ([place])    08/03/2016 Imaging   CT neck- 1. Diffuse and bulky lymphadenopathy in keeping with history of CLL. Nodal enlargement has diffusely progressed since April 2016. 2. Chest and abdomen CT reported separately.   08/03/2016 Imaging   CT CAP- 1. Bulky lymphadenopathy in the chest, abdomen, and pelvis as described. 2. Splenomegaly. 3. Coronary artery and thoracoabdominal aortic atherosclerosis. 4. Prostatomegaly. 5. Several tiny lucent lesions in the bony anatomic pelvis, nonspecific, but attention on follow-up imaging recommended.   08/10/2016 Procedure   Bone marrow aspiration and biopsy by IR.    08/11/2016 Pathology Results   Bone Marrow, Aspirate,Biopsy, and Clot, left iliac crest - MARKED INVOLVEMENT BY CHRONIC LYMPHOCYTIC LEUKEMIA/SMALL LYMPHOCYTIC LYMPHOMA. PERIPHERAL BLOOD: - CHRONIC LYMPHOCYTIC LEUKEMIA. - THROMBOCYTOPENIA.    08/11/2016 Pathology Results   Bone Marrow Flow Cytometry - CHRONIC LYMPHOCYTIC LEUKEMIA/SMALL LYMPHOCYTIC LYMPHOMA.    08/16/2016 Pathology Results   Normal cytogenetics.  Molecular cytogenetic analysis by [facility] demonstrates a 13q-    08/19/2016 -  Chemotherapy   Imbruvica 420 mg daily     03/11/2020 -  Chemotherapy      Patient is on Antibody Plan: CLL RITUXIMAB Q28D       CANCER STAGING: Cancer Staging CLL (chronic lymphocytic leukemia) ([place]) Staging  form: Chronic Lymphocytic Leukemia / Small Lymphocytic Lymphoma, AJCC 8th Edition - Clinical stage from 09/04/2016: Modified Rai Stage IV (Modified Rai risk: High, Binet: Stage C, Lymphocytosis: Present, Adenopathy: Present, Organomegaly: Present, Anemia: Absent, Thrombocytopenia: Present) - Signed by Baird Cancer, PA-C on 09/04/2016   INTERVAL HISTORY:  Andrew [name], a 80 y.o. male, returns for routine follow-up of his CLL. [name] was last seen on 11/08/20.   Today he reports feeling well. His appetite is good, and he denies severe fatigue, n/v/d, skin rash, dizziness, nosebleeds, or any other bleeding issues. He reports occasional itching on his back as well as tingling in his toes.   REVIEW OF SYSTEMS:  Review of Systems  Constitutional:  Positive for fatigue (75%). Negative for appetite change.  HENT:   Negative for nosebleeds.   Gastrointestinal:  Negative for blood in stool, diarrhea, nausea and vomiting.  Genitourinary:  Negative for hematuria.   Skin:  Positive for itching (back). Negative for rash.  Neurological:  Positive for numbness (tingling in toes). Negative for dizziness.  Hematological:  Does not bruise/bleed easily.  All other systems reviewed and are negative.  PAST MEDICAL/SURGICAL HISTORY:  Past Medical History:  Diagnosis Date   Arthritis    Ascending aortic aneurysm (HCC)    CKD (chronic kidney disease), stage IV ([facility])    followed by [facility]   CLL (chronic lymphocytic leukemia) ([facility])    Complication of anesthesia    "wild when I woke up "   History of kidney stones    passed    Hypertension    NICM (nonischemic cardiomyopathy) ([facility])    a. cath 2007, EF  35%, minimal CAD. b. EF normal 2018.   PAF (paroxysmal atrial fibrillation) (HCC)    Pinched nerve in neck    Pneumonia    SDH (subdural hematoma) (HCC)    Thrombocytopenia (HCC)    Past Surgical History:  Procedure Laterality Date   AV FISTULA PLACEMENT Left 04/30/2020   Procedure: LEFT  ARM BRACHIOCEPHALIC ARTERIOVENOUS (AV) FISTULA CREATION;  Surgeon: Serafina Mitchell, MD;  Location: [facility];  Service: Vascular;  Laterality: Left;   BIOPSY  08/05/2018   Procedure: BIOPSY;  Surgeon: Danie Binder, MD;  [facility];  Service: Endoscopy;;  gastric    Bone marrow biospy Bilateral    CATARACT EXTRACTION, BILATERAL     COLONOSCOPY  07/10/2011   Procedure: COLONOSCOPY;  Surgeon: Dorothyann Peng, MD;  [facility];  Service: Endoscopy;  Laterality: N/A;  10:30 AM   CRANIOTOMY N/A 10/14/2016   Procedure: CRANIOTOMY HEMATOMA EVACUATION SUBDURAL;  Surgeon: Jovita Gamma, MD;  Location: [facility];  Service: Neurosurgery;  Laterality: N/A;  CRANIOTOMY HEMATOMA EVACUATION SUBDURAL   ESOPHAGOGASTRODUODENOSCOPY N/A 08/05/2018   Procedure: ESOPHAGOGASTRODUODENOSCOPY (EGD);  Surgeon: Danie Binder, MD;  [facility];  Service: Endoscopy;  Laterality: N/A;  3:00pm   THYROIDECTOMY, PARTIAL     TONSILLECTOMY      SOCIAL HISTORY:  Social History   Socioeconomic History   Marital status: Married    Spouse name: Not on file   Number of children: Not on file   Years of education: Not on file   Highest education level: Not on file  Occupational History   Not on file  Tobacco Use   Smoking status: Former    Packs/day: 1.00    Years: 15.00    Pack years: 15.00    Types: Cigarettes    Quit date: 01/26/1971    Years since quitting: 49.9   Smokeless tobacco: Never  Vaping Use   Vaping Use: Never used  Substance and Sexual Activity   Alcohol use: No   Drug use: No   Sexual activity: Not on file  Other Topics Concern   Not on file  Social History Narrative   Not on file   Social Determinants of Health   Financial Resource Strain: Low Risk    Difficulty of Paying Living Expenses: Not hard at all  Food Insecurity: No Food Insecurity   Worried About Charity fundraiser in the Last Year: Never true   [place] in the Last Year: Never true   Transportation Needs: No Transportation Needs   Lack of Transportation (Medical): No   Lack of Transportation (Non-Medical): No  Physical Activity: Inactive   Days of Exercise per Week: 0 days   Minutes of Exercise per Session: 0 min  Stress: No Stress Concern Present   Feeling of Stress : Not at all  Social Connections: Moderately Integrated   Frequency of Communication with Friends and Family: More than three times a week   Frequency of Social Gatherings with Friends and Family: More than three times a week   Attends Religious Services: More than 4 times per year   Active Member of [employer] or Organizations: No   Attends Archivist Meetings: Never   Marital Status: Married  Human resources officer Violence: Not At Risk   Fear of Current or Ex-Partner: No   Emotionally Abused: No   Physically Abused: No   Sexually Abused: No    FAMILY HISTORY:  Family History  Problem Relation  Age of Onset   CAD Father 67   Cancer Mother    Alzheimer's disease Mother    Colon cancer Neg Hx     CURRENT MEDICATIONS:  Current Outpatient Medications  Medication Sig Dispense Refill   amLODipine (NORVASC) 5 MG tablet Take 5 mg by mouth daily.     carvedilol (COREG) 6.25 MG tablet Take 1 tablet (6.25 mg total) by mouth 2 (two) times daily. 180 tablet 3   Cholecalciferol (VITAMIN D3) 25 MCG (1000 UT) CAPS Take 1 capsule by mouth daily.     Cyanocobalamin (VITAMIN B12) 1000 MCG TBCR Take 1,000 mcg by mouth daily.      Multiple Vitamin (MULITIVITAMIN WITH MINERALS) TABS Take 1 tablet by mouth daily.     mupirocin ointment (BACTROBAN) 2 % Apply topically.     omeprazole (PRILOSEC) 20 MG capsule 1 PO 30 MINS PRIOR TO BREAKFAST. (Patient taking differently: Take 20 mg by mouth daily.) 90 capsule 3   sodium bicarbonate 325 MG tablet Take 325 mg by mouth 3 (three) times daily.     tamsulosin (FLOMAX) 0.4 MG CAPS capsule Take 0.4 mg by mouth daily.      venetoclax (VENCLEXTA) 100 MG tablet Take 3  tablets (300 mg total) by mouth daily. 90 tablet 3   No current facility-administered medications for this visit.    ALLERGIES:  No Known Allergies  PHYSICAL EXAM:  Performance status (ECOG): 1 - Symptomatic but completely ambulatory  There were no vitals filed for this visit. Wt Readings from Last 3 Encounters:  11/08/20 226 lb 8 oz (102.7 kg)  09/30/20 231 lb 12.8 oz (105.1 kg)  09/29/20 232 lb 3.2 oz (105.3 kg)   Physical Exam Vitals reviewed.  Constitutional:      Appearance: Normal appearance.  Cardiovascular:     Rate and Rhythm: Normal rate and regular rhythm.     Pulses: Normal pulses.     Heart sounds: Normal heart sounds.  Pulmonary:     Effort: Pulmonary effort is normal.     Breath sounds: Normal breath sounds.  Musculoskeletal:     Right lower leg: No edema.     Left lower leg: No edema.  Neurological:     General: No focal deficit present.     Mental Status: He is alert and oriented to person, place, and time.  Psychiatric:        Mood and Affect: Mood normal.        Behavior: Behavior normal.     LABORATORY DATA:  I have reviewed the labs as listed.  CBC Latest Ref Rng & Units 12/13/2020 10/28/2020 09/29/2020  WBC 4.0 - 10.5 K/uL 3.6(L) 2.8(L) 3.1(L)  Hemoglobin 13.0 - 17.0 g/dL 11.5(L) 11.3(L) 10.9(L)  Hematocrit 39.0 - 52.0 % 33.6(L) 33.6(L) 31.8(L)  Platelets 150 - 400 K/uL 78(L) 80(L) 86(L)   CMP Latest Ref Rng & Units 12/13/2020 10/28/2020 09/29/2020  Glucose 70 - 99 mg/dL 113(H) 110(H) 98  BUN 8 - 23 mg/dL 57(H) 45(H) 47(H)  Creatinine 0.61 - 1.24 mg/dL 4.66(H) 4.43(H) 4.22(H)  Sodium 135 - 145 mmol/L 136 137 138  Potassium 3.5 - 5.1 mmol/L 3.1(L) 4.2 4.1  Chloride 98 - 111 mmol/L 102 108 110  CO2 22 - 32 mmol/L 24 21(L) 21(L)  Calcium 8.9 - 10.3 mg/dL 8.5(L) 8.5(L) 8.7(L)  Total Protein 6.5 - 8.1 g/dL 6.3(L) 6.3(L) 6.2(L)  Total Bilirubin 0.3 - 1.2 mg/dL 0.8 0.8 1.0  Alkaline Phos 38 - 126 U/L 65 63 57  AST 15 - 41 U/L '17 16 16  ' ALT 0 - 44 U/L  '16 14 14    ' DIAGNOSTIC IMAGING:  I have independently reviewed the scans and discussed with the patient. CT CHEST ABDOMEN PELVIS WO CONTRAST  Result Date: 12/17/2020 CLINICAL DATA:  Chronic lymphocytic leukemia. Assess treatment response. Intermittent leg swelling. EXAM: CT CHEST, ABDOMEN AND PELVIS WITHOUT CONTRAST TECHNIQUE: Multidetector CT imaging of the chest, abdomen and pelvis was performed following the standard protocol without IV contrast. COMPARISON:  PET-CT 12/01/2019.  Prior CTs 08/03/2016. FINDINGS: CT CHEST FINDINGS Cardiovascular: Atherosclerosis of the aorta, great vessels and coronary arteries. The heart size is normal. There is no pericardial effusion. Mediastinum/Nodes: Significant interval improvement in the previously demonstrated widespread axillary, mediastinal and hilar lymphadenopathy. There are no remaining enlarged thoracic lymph nodes. Postsurgical changes consistent with previous right thyroid resection. The left thyroid lobe and trachea appear unremarkable. Stable small hiatal hernia. Lungs/Pleura: There is no pleural effusion. Mild centrilobular and paraseptal emphysema. No suspicious pulmonary nodularity. Musculoskeletal/Chest wall: No chest wall mass or suspicious osseous findings. CT ABDOMEN AND PELVIS FINDINGS Hepatobiliary: Small low-density lesions in the left hepatic lobe are stable, likely cysts. No suspicious hepatic findings on noncontrast imaging. No evidence of gallstones, gallbladder wall thickening or biliary dilatation. Pancreas: Unremarkable. No pancreatic ductal dilatation or surrounding inflammatory changes. Spleen: Normal in size without focal abnormality. Adrenals/Urinary Tract: Both adrenal glands appear normal. Nonobstructing bilateral renal calculi. Renal cortical thinning has progressed compared with remote CT. No evidence of ureteral calculus or hydronephrosis. Stable mild bladder wall thickening. Stomach/Bowel: Enteric contrast was administered and has  passed into the rectum. The stomach appears unremarkable for its degree of distension. No evidence of bowel wall thickening, distention or surrounding inflammatory change. The appendix appears normal. Vascular/Lymphatic: The previously demonstrated abdominopelvic lymphadenopathy has improved. The largest remaining lymph nodes include a left external iliac node measuring 9 mm on image 103/2 (previously 15 mm), a 7 mm left external iliac node on image 108/2 (previously 16 mm) and a 9 mm left inguinal node on image 127/2 (previously 13 mm). Aortic and branch vessel atherosclerosis with tortuosity. No evidence of aneurysm. Reproductive: Moderate enlargement of the prostate gland. Other: No evidence of abdominal wall mass or hernia. No ascites. Musculoskeletal: No acute or significant osseous findings. Multilevel lumbar spondylosis. IMPRESSION: 1. Compared with PET-CT of 12/01/2019, there has been significant interval improvement in the multifocal adenopathy within the chest, abdomen and pelvis. Only a few borderline enlarged left pelvic lymph nodes remain. 2. No residual splenomegaly or focal splenic abnormality. 3. No suspicious pulmonary findings. 4. Nonobstructing bilateral renal calculi with bilateral renal cortical thinning. 5.  Aortic Atherosclerosis (ICD10-I70.0). Electronically Signed   By: Richardean Sale M.D.   On: 12/17/2020 16:24     ASSESSMENT:  1.  CLL, Rai stage IV: -BMBX on 08/10/2016 shows bone marrow involvement by CLL. -He also had bulky diffuse adenopathy with progressive thrombocytopenia. -Imbruvica 420 mg from 08/19/2016 through May 2018, discontinued due to subdural hematoma on 10/14/2016. -Subsequently offered chlorambucil and obinutuzumab.  He refused obinutuzumab.  Chlorambucil 30 mg every 2 weeks from June 2019 through 11/07/2018 due to severe weakness. -PET scan on 12/01/2019 showed multiple bilateral enlarged cervical lymph nodes, bilateral axillary and mediastinal adenopathy, prevascular  lymph nodes, left lower paratracheal lymph node, minimal adenopathy in the retroperitoneum, external iliac lymph nodes.  Spleen measures 16 cm.  All lymph nodes measure less than 5 cm with SUV less than 5. 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  IGHV was mutated. -T p53 mutation is pending. -Venetoclax 10 mg started on 12/02/2019 (dose reduced secondary to Coreg), dose increased to 50 mg daily on 12/12/2019. -Dose increased to 100 mg daily on 12/19/2019, held on 12/25/2019 due to elevated creatinine. -Venetoclax 100 mg daily started back on 01/15/2020.  Dose increased to 200 mg daily on 07/01/2020.  Dose increased to 300 mg daily on 09/29/2020. - Rituximab 6 cycles completed on 07/29/2020. - CT CAP on 12/16/2020 showed complete resolution of lymphadenopathy in the chest.  Spleen size is normal.  Subcentimeter lymphadenopathy in the left external iliac region and left inguinal region.   2.  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   PLAN:  1.  CLL, Rai stage IV: - Venetoclax dose was increased to 300 mg daily on 09/29/2020. - Reviewed labs from 12/13/2020 which showed white count 3.6 and platelet count 78.  ANC was normal.  LFTs were normal. - I have reviewed CT CAP from 12/16/2020.  Complete resolution of lymphadenopathy in the chest.  Spleen is normal in size.  Very mild residual adenopathy with largest lymph node in the left external iliac region measuring 9 mm and left inguinal node measuring 9 mm. - Continue venetoclax 300 mg daily.  No infections or other B symptoms reported. - RTC 2 months for follow-up with labs.   2.  CKD: - Creatinine has increased to 4.66. - He was reportedly started on HCTZ 12.5 mg daily for ankle swellings about a month ago by Dr. Nevada Crane. - The slight elevation in creatinine could be likely coming from HCTZ.  He does not have any swelling today.  I have told him to cut  back to every other day or use it only if needed.  Mild hypokalemia was also likely from HCTZ.   3.  Tumor lysis prophylaxis: - Uric acid is 7.9 on the latest blood count.  Allopurinol has been discontinued.   4.  Normocytic anemia: - Last Venofer on 07/29/2020.  Hemoglobin today is 11.5. - We will check ferritin and iron panel at next visit.   Orders placed this encounter:  No orders of the defined types were placed in this encounter.    Derek Jack, MD [facility] [phone]   I, Thana Ates, am acting as a scribe for Dr. Derek Jack.  I, Derek Jack MD, have reviewed the above documentation for accuracy and completeness, and I agree with the above.

## 2020-12-20 ENCOUNTER — Other Ambulatory Visit: Payer: Self-pay

## 2020-12-20 ENCOUNTER — Inpatient Hospital Stay (HOSPITAL_BASED_OUTPATIENT_CLINIC_OR_DEPARTMENT_OTHER): Payer: Medicare HMO | Admitting: Hematology

## 2020-12-20 VITALS — BP 108/64 | HR 65 | Temp 96.8°F | Resp 20 | Wt 220.5 lb

## 2020-12-20 DIAGNOSIS — D509 Iron deficiency anemia, unspecified: Secondary | ICD-10-CM

## 2020-12-20 DIAGNOSIS — C9110 Chronic lymphocytic leukemia of B-cell type not having achieved remission: Secondary | ICD-10-CM | POA: Diagnosis not present

## 2020-12-20 DIAGNOSIS — N189 Chronic kidney disease, unspecified: Secondary | ICD-10-CM | POA: Diagnosis not present

## 2020-12-20 DIAGNOSIS — D649 Anemia, unspecified: Secondary | ICD-10-CM | POA: Diagnosis not present

## 2020-12-20 NOTE — Patient Instructions (Signed)
[facility] at [facility] Discharge Instructions  You were seen today by Dr. Katragadda. He went over your recent results. Dr. Katragadda will see you back in 2 months for labs and follow up.   Thank you for choosing [facility] at [facility] to provide your oncology and hematology care.  To afford each patient quality time with our provider, please arrive at least 15 minutes before your scheduled appointment time.   If you have a lab appointment with the Cancer Center please come in thru the Main Entrance and check in at the main information desk  You need to re-schedule your appointment should you arrive 10 or more minutes late.  We strive to give you quality time with our providers, and arriving late affects you and other patients whose appointments are after yours.  Also, if you no show three or more times for appointments you may be dismissed from the clinic at the providers discretion.     Again, thank you for choosing [facility].  Our hope is that these requests will decrease the amount of time that you wait before being seen by our physicians.       _____________________________________________________________  Should you have questions after your visit to [facility], please contact our office at [phone] between the hours of 8:00 a.m. and 4:30 p.m.  Voicemails left after 4:00 p.m. will not be returned until the following business day.  For prescription refill requests, have your pharmacy contact our office and allow 72 hours.    Cancer Center Support Programs:   > Cancer Support Group  2nd Tuesday of the month 1pm-2pm, Journey Room   

## 2020-12-26 DIAGNOSIS — I10 Essential (primary) hypertension: Secondary | ICD-10-CM | POA: Diagnosis not present

## 2020-12-26 DIAGNOSIS — E7800 Pure hypercholesterolemia, unspecified: Secondary | ICD-10-CM | POA: Diagnosis not present

## 2020-12-27 ENCOUNTER — Other Ambulatory Visit (HOSPITAL_COMMUNITY): Payer: Self-pay

## 2020-12-30 ENCOUNTER — Other Ambulatory Visit (HOSPITAL_COMMUNITY): Payer: Self-pay

## 2021-01-25 ENCOUNTER — Other Ambulatory Visit (HOSPITAL_COMMUNITY): Payer: Self-pay | Admitting: Hematology

## 2021-01-25 ENCOUNTER — Other Ambulatory Visit (HOSPITAL_COMMUNITY): Payer: Self-pay

## 2021-01-25 DIAGNOSIS — C9110 Chronic lymphocytic leukemia of B-cell type not having achieved remission: Secondary | ICD-10-CM

## 2021-01-25 MED ORDER — VENETOCLAX 100 MG PO TABS
300.0000 mg | ORAL_TABLET | Freq: Every day | ORAL | 3 refills | Status: DC
  Filled 2021-01-25: qty 90, 30d supply, fill #0
  Filled 2021-02-22: qty 90, 30d supply, fill #1
  Filled 2021-03-22: qty 90, 30d supply, fill #2
  Filled 2021-05-02: qty 90, 30d supply, fill #3

## 2021-01-26 DIAGNOSIS — E119 Type 2 diabetes mellitus without complications: Secondary | ICD-10-CM | POA: Diagnosis not present

## 2021-01-26 DIAGNOSIS — I10 Essential (primary) hypertension: Secondary | ICD-10-CM | POA: Diagnosis not present

## 2021-01-26 DIAGNOSIS — I129 Hypertensive chronic kidney disease with stage 1 through stage 4 chronic kidney disease, or unspecified chronic kidney disease: Secondary | ICD-10-CM | POA: Diagnosis not present

## 2021-01-27 ENCOUNTER — Other Ambulatory Visit (HOSPITAL_COMMUNITY): Payer: Self-pay

## 2021-02-18 DIAGNOSIS — L821 Other seborrheic keratosis: Secondary | ICD-10-CM | POA: Diagnosis not present

## 2021-02-18 DIAGNOSIS — L570 Actinic keratosis: Secondary | ICD-10-CM | POA: Diagnosis not present

## 2021-02-18 DIAGNOSIS — X32XXXD Exposure to sunlight, subsequent encounter: Secondary | ICD-10-CM | POA: Diagnosis not present

## 2021-02-18 DIAGNOSIS — L0100 Impetigo, unspecified: Secondary | ICD-10-CM | POA: Diagnosis not present

## 2021-02-22 ENCOUNTER — Other Ambulatory Visit (HOSPITAL_COMMUNITY): Payer: Self-pay

## 2021-02-22 DIAGNOSIS — I10 Essential (primary) hypertension: Secondary | ICD-10-CM | POA: Diagnosis not present

## 2021-02-22 DIAGNOSIS — R7301 Impaired fasting glucose: Secondary | ICD-10-CM | POA: Diagnosis not present

## 2021-02-24 ENCOUNTER — Other Ambulatory Visit: Payer: Self-pay

## 2021-02-24 ENCOUNTER — Inpatient Hospital Stay (HOSPITAL_COMMUNITY): Payer: Medicare HMO | Attending: Hematology

## 2021-02-24 DIAGNOSIS — N200 Calculus of kidney: Secondary | ICD-10-CM | POA: Diagnosis not present

## 2021-02-24 DIAGNOSIS — D696 Thrombocytopenia, unspecified: Secondary | ICD-10-CM | POA: Diagnosis not present

## 2021-02-24 DIAGNOSIS — I120 Hypertensive chronic kidney disease with stage 5 chronic kidney disease or end stage renal disease: Secondary | ICD-10-CM | POA: Diagnosis not present

## 2021-02-24 DIAGNOSIS — E782 Mixed hyperlipidemia: Secondary | ICD-10-CM | POA: Diagnosis not present

## 2021-02-24 DIAGNOSIS — Z0001 Encounter for general adult medical examination with abnormal findings: Secondary | ICD-10-CM | POA: Diagnosis not present

## 2021-02-24 DIAGNOSIS — K2270 Barrett's esophagus without dysplasia: Secondary | ICD-10-CM | POA: Diagnosis not present

## 2021-02-24 DIAGNOSIS — N184 Chronic kidney disease, stage 4 (severe): Secondary | ICD-10-CM | POA: Diagnosis not present

## 2021-02-24 DIAGNOSIS — N12 Tubulo-interstitial nephritis, not specified as acute or chronic: Secondary | ICD-10-CM | POA: Diagnosis not present

## 2021-02-24 DIAGNOSIS — N179 Acute kidney failure, unspecified: Secondary | ICD-10-CM | POA: Diagnosis not present

## 2021-02-24 DIAGNOSIS — C9110 Chronic lymphocytic leukemia of B-cell type not having achieved remission: Secondary | ICD-10-CM | POA: Diagnosis not present

## 2021-02-24 DIAGNOSIS — D6949 Other primary thrombocytopenia: Secondary | ICD-10-CM | POA: Diagnosis not present

## 2021-02-24 DIAGNOSIS — I129 Hypertensive chronic kidney disease with stage 1 through stage 4 chronic kidney disease, or unspecified chronic kidney disease: Secondary | ICD-10-CM | POA: Diagnosis not present

## 2021-02-24 DIAGNOSIS — I480 Paroxysmal atrial fibrillation: Secondary | ICD-10-CM | POA: Diagnosis not present

## 2021-02-24 DIAGNOSIS — Z8616 Personal history of COVID-19: Secondary | ICD-10-CM | POA: Diagnosis not present

## 2021-02-24 DIAGNOSIS — Z23 Encounter for immunization: Secondary | ICD-10-CM | POA: Diagnosis not present

## 2021-02-24 DIAGNOSIS — Z8679 Personal history of other diseases of the circulatory system: Secondary | ICD-10-CM | POA: Diagnosis not present

## 2021-02-24 DIAGNOSIS — D649 Anemia, unspecified: Secondary | ICD-10-CM | POA: Diagnosis not present

## 2021-02-24 DIAGNOSIS — D509 Iron deficiency anemia, unspecified: Secondary | ICD-10-CM

## 2021-02-24 DIAGNOSIS — I770 Arteriovenous fistula, acquired: Secondary | ICD-10-CM | POA: Diagnosis not present

## 2021-02-24 DIAGNOSIS — I10 Essential (primary) hypertension: Secondary | ICD-10-CM | POA: Diagnosis not present

## 2021-02-24 LAB — COMPREHENSIVE METABOLIC PANEL
ALT: 14 U/L (ref 0–44)
AST: 17 U/L (ref 15–41)
Albumin: 3.9 g/dL (ref 3.5–5.0)
Alkaline Phosphatase: 59 U/L (ref 38–126)
Anion gap: 7 (ref 5–15)
BUN: 39 mg/dL — ABNORMAL HIGH (ref 8–23)
CO2: 25 mmol/L (ref 22–32)
Calcium: 8.7 mg/dL — ABNORMAL LOW (ref 8.9–10.3)
Chloride: 106 mmol/L (ref 98–111)
Creatinine, Ser: 3.66 mg/dL — ABNORMAL HIGH (ref 0.61–1.24)
GFR, Estimated: 16 mL/min — ABNORMAL LOW (ref >60)
Glucose, Bld: 105 mg/dL — ABNORMAL HIGH (ref 70–99)
Potassium: 3.9 mmol/L (ref 3.5–5.1)
Sodium: 138 mmol/L (ref 135–145)
Total Bilirubin: 0.6 mg/dL (ref 0.3–1.2)
Total Protein: 6.1 g/dL — ABNORMAL LOW (ref 6.5–8.1)

## 2021-02-24 LAB — CBC WITH DIFFERENTIAL/PLATELET
Abs Immature Granulocytes: 0.01 10*3/uL (ref 0.00–0.07)
Basophils Absolute: 0 10*3/uL (ref 0.0–0.1)
Basophils Relative: 0 %
Eosinophils Absolute: 0 10*3/uL (ref 0.0–0.5)
Eosinophils Relative: 1 %
HCT: 33.3 % — ABNORMAL LOW (ref 39.0–52.0)
Hemoglobin: 11.7 g/dL — ABNORMAL LOW (ref 13.0–17.0)
Immature Granulocytes: 0 %
Lymphocytes Relative: 12 %
Lymphs Abs: 0.3 10*3/uL — ABNORMAL LOW (ref 0.7–4.0)
MCH: 35.6 pg — ABNORMAL HIGH (ref 26.0–34.0)
MCHC: 35.1 g/dL (ref 30.0–36.0)
MCV: 101.2 fL — ABNORMAL HIGH (ref 80.0–100.0)
Monocytes Absolute: 0.4 10*3/uL (ref 0.1–1.0)
Monocytes Relative: 17 %
Neutro Abs: 1.8 10*3/uL (ref 1.7–7.7)
Neutrophils Relative %: 70 %
Platelets: 78 10*3/uL — ABNORMAL LOW (ref 150–400)
RBC: 3.29 MIL/uL — ABNORMAL LOW (ref 4.22–5.81)
RDW: 13.2 % (ref 11.5–15.5)
WBC: 2.5 10*3/uL — ABNORMAL LOW (ref 4.0–10.5)
nRBC: 0 % (ref 0.0–0.2)

## 2021-02-24 LAB — LACTATE DEHYDROGENASE: LDH: 124 U/L (ref 98–192)

## 2021-02-24 LAB — IRON AND TIBC
Iron: 76 ug/dL (ref 45–182)
Saturation Ratios: 25 % (ref 17.9–39.5)
TIBC: 298 ug/dL (ref 250–450)
UIBC: 222 ug/dL

## 2021-02-24 LAB — URIC ACID: Uric Acid, Serum: 6.5 mg/dL (ref 3.7–8.6)

## 2021-02-24 LAB — FERRITIN: Ferritin: 131 ng/mL (ref 24–336)

## 2021-02-24 LAB — MAGNESIUM: Magnesium: 2.1 mg/dL (ref 1.7–2.4)

## 2021-02-25 ENCOUNTER — Other Ambulatory Visit (HOSPITAL_COMMUNITY): Payer: Self-pay

## 2021-02-25 DIAGNOSIS — I10 Essential (primary) hypertension: Secondary | ICD-10-CM | POA: Diagnosis not present

## 2021-02-25 DIAGNOSIS — I129 Hypertensive chronic kidney disease with stage 1 through stage 4 chronic kidney disease, or unspecified chronic kidney disease: Secondary | ICD-10-CM | POA: Diagnosis not present

## 2021-02-25 DIAGNOSIS — E785 Hyperlipidemia, unspecified: Secondary | ICD-10-CM | POA: Diagnosis not present

## 2021-03-02 NOTE — Progress Notes (Signed)
[facility] [address] [place] [address]   CLINIC:  Medical Oncology/Hematology  PCP:  [name], MD [address] [place] [address] [phone]   REASON FOR VISIT:  Follow-up for CLL  PRIOR THERAPY:  1. Imbruvica 420 mg from 08/19/2016 through 09/2016. 2. Chlorambucil 30 mg from 10/2017 through 11/07/2018.  NGS Results: not done  CURRENT THERAPY: Venetoclax 200 mg daily  BRIEF ONCOLOGIC HISTORY:  Oncology History  CLL (chronic lymphocytic leukemia) ([facility])  03/09/2015 Initial Diagnosis   CLL (chronic lymphocytic leukemia) ([facility])   08/03/2016 Imaging   CT neck- 1. Diffuse and bulky lymphadenopathy in keeping with history of CLL. Nodal enlargement has diffusely progressed since April 2016. 2. Chest and abdomen CT reported separately.   08/03/2016 Imaging   CT CAP- 1. Bulky lymphadenopathy in the chest, abdomen, and pelvis as described. 2. Splenomegaly. 3. Coronary artery and thoracoabdominal aortic atherosclerosis. 4. Prostatomegaly. 5. Several tiny lucent lesions in the bony anatomic pelvis, nonspecific, but attention on follow-up imaging recommended.   08/10/2016 Procedure   Bone marrow aspiration and biopsy by IR.   08/11/2016 Pathology Results   Bone Marrow, Aspirate,Biopsy, and Clot, left iliac crest - MARKED INVOLVEMENT BY CHRONIC LYMPHOCYTIC LEUKEMIA/SMALL LYMPHOCYTIC LYMPHOMA. PERIPHERAL BLOOD: - CHRONIC LYMPHOCYTIC LEUKEMIA. - THROMBOCYTOPENIA.   08/11/2016 Pathology Results   Bone Marrow Flow Cytometry - CHRONIC LYMPHOCYTIC LEUKEMIA/SMALL LYMPHOCYTIC LYMPHOMA.   08/16/2016 Pathology Results   Normal cytogenetics.  Molecular cytogenetic analysis by [place] demonstrates a 13q-   08/19/2016 -  Chemotherapy   Imbruvica 420 mg daily    03/11/2020 -  Chemotherapy      Patient is on Antibody Plan: CLL RITUXIMAB Q28D       CANCER STAGING: Cancer Staging CLL (chronic lymphocytic leukemia) ([facility]) Staging form: Chronic  Lymphocytic Leukemia / Small Lymphocytic Lymphoma, AJCC 8th Edition - Clinical stage from 09/04/2016: Modified Rai Stage IV (Modified Rai risk: High, Binet: Stage C, Lymphocytosis: Present, Adenopathy: Present, Organomegaly: Present, Anemia: Absent, Thrombocytopenia: Present) - Signed by Baird Cancer, PA-C on 09/04/2016   INTERVAL HISTORY:  [name], a 80 y.o. male, returns for routine follow-up of his CLL. [name] was last seen on 12/20/2020.   Today he reports feeling good. He is no longer taking HCTZ. He denies fevers, night sweats, lumps or masses, n/v/d, and leg swellings.   REVIEW OF SYSTEMS:  Review of Systems  Constitutional:  Negative for appetite change, fatigue (75%) and fever.  HENT:   Negative for lump/mass.   Respiratory:  Positive for shortness of breath.   Cardiovascular:  Negative for leg swelling.  Gastrointestinal:  Negative for diarrhea, nausea and vomiting.  Neurological:  Positive for numbness (toes).  All other systems reviewed and are negative.  PAST MEDICAL/SURGICAL HISTORY:  Past Medical History:  Diagnosis Date   Arthritis    Ascending aortic aneurysm (HCC)    CKD (chronic kidney disease), stage IV ([facility])    followed by [facility]   CLL (chronic lymphocytic leukemia) ([facility])    Complication of anesthesia    "wild when I woke up "   History of kidney stones    passed    Hypertension    NICM (nonischemic cardiomyopathy) ([place])    a. cath 2007, EF 35%, minimal CAD. b. EF normal 2018.   PAF (paroxysmal atrial fibrillation) (HCC)    Pinched nerve in neck    Pneumonia    SDH (subdural hematoma) (HCC)    Thrombocytopenia ([place])  Past Surgical History:  Procedure Laterality Date   AV FISTULA PLACEMENT Left 04/30/2020   Procedure: LEFT ARM BRACHIOCEPHALIC ARTERIOVENOUS (AV) FISTULA CREATION;  Surgeon: Serafina Mitchell, MD;  Location: [facility];  Service: Vascular;  Laterality: Left;   BIOPSY  08/05/2018   Procedure: BIOPSY;  Surgeon: Danie Binder,  MD;  [facility];  Service: Endoscopy;;  gastric    Bone marrow biospy Bilateral    CATARACT EXTRACTION, BILATERAL     COLONOSCOPY  07/10/2011   Procedure: COLONOSCOPY;  Surgeon: Dorothyann Peng, MD;  [facility];  Service: Endoscopy;  Laterality: N/A;  10:30 AM   CRANIOTOMY N/A 10/14/2016   Procedure: CRANIOTOMY HEMATOMA EVACUATION SUBDURAL;  Surgeon: Jovita Gamma, MD;  Location: [facility];  Service: Neurosurgery;  Laterality: N/A;  CRANIOTOMY HEMATOMA EVACUATION SUBDURAL   ESOPHAGOGASTRODUODENOSCOPY N/A 08/05/2018   Procedure: ESOPHAGOGASTRODUODENOSCOPY (EGD);  Surgeon: Danie Binder, MD;  [facility];  Service: Endoscopy;  Laterality: N/A;  3:00pm   THYROIDECTOMY, PARTIAL     TONSILLECTOMY      SOCIAL HISTORY:  Social History   Socioeconomic History   Marital status: Married    Spouse name: Not on file   Number of children: Not on file   Years of education: Not on file   Highest education level: Not on file  Occupational History   Not on file  Tobacco Use   Smoking status: Former    Packs/day: 1.00    Years: 15.00    Pack years: 15.00    Types: Cigarettes    Quit date: 01/26/1971    Years since quitting: 50.1   Smokeless tobacco: Never  Vaping Use   Vaping Use: Never used  Substance and Sexual Activity   Alcohol use: No   Drug use: No   Sexual activity: Not on file  Other Topics Concern   Not on file  Social History Narrative   Not on file   Social Determinants of Health   Financial Resource Strain: Low Risk    Difficulty of Paying Living Expenses: Not hard at all  Food Insecurity: No Food Insecurity   Worried About Charity fundraiser in the Last Year: Never true   [place] in the Last Year: Never true  Transportation Needs: No Transportation Needs   Lack of Transportation (Medical): No   Lack of Transportation (Non-Medical): No  Physical Activity: Inactive   Days of Exercise per Week: 0 days   Minutes of Exercise per  Session: 0 min  Stress: No Stress Concern Present   Feeling of Stress : Not at all  Social Connections: Moderately Integrated   Frequency of Communication with Friends and Family: More than three times a week   Frequency of Social Gatherings with Friends and Family: More than three times a week   Attends Religious Services: More than 4 times per year   Active Member of [employer] or Organizations: No   Attends Music therapist: Never   Marital Status: Married  Human resources officer Violence: Not At Risk   Fear of Current or Ex-Partner: No   Emotionally Abused: No   Physically Abused: No   Sexually Abused: No    FAMILY HISTORY:  Family History  Problem Relation Age of Onset   CAD Father 29   Cancer Mother    Alzheimer's disease Mother    Colon cancer Neg Hx     CURRENT MEDICATIONS:  Current Outpatient Medications  Medication Sig Dispense Refill  amLODipine (NORVASC) 5 MG tablet Take 5 mg by mouth daily.     carvedilol (COREG) 6.25 MG tablet Take 1 tablet (6.25 mg total) by mouth 2 (two) times daily. 180 tablet 3   Cholecalciferol (VITAMIN D3) 25 MCG (1000 UT) CAPS Take 1 capsule by mouth daily.     Cyanocobalamin (VITAMIN B12) 1000 MCG TBCR Take 1,000 mcg by mouth daily.      Multiple Vitamin (MULITIVITAMIN WITH MINERALS) TABS Take 1 tablet by mouth daily.     mupirocin ointment (BACTROBAN) 2 % Apply topically.     omeprazole (PRILOSEC) 20 MG capsule 1 PO 30 MINS PRIOR TO BREAKFAST. (Patient taking differently: Take 20 mg by mouth daily.) 90 capsule 3   sodium bicarbonate 325 MG tablet Take 325 mg by mouth 3 (three) times daily.     tamsulosin (FLOMAX) 0.4 MG CAPS capsule Take 0.4 mg by mouth daily.      venetoclax (VENCLEXTA) 100 MG tablet Take 3 tablets (300 mg total) by mouth daily. 90 tablet 3   No current facility-administered medications for this visit.    ALLERGIES:  No Known Allergies  PHYSICAL EXAM:  Performance status (ECOG): 1 - Symptomatic but completely  ambulatory  There were no vitals filed for this visit. Wt Readings from Last 3 Encounters:  12/20/20 220 lb 8 oz (100 kg)  11/08/20 226 lb 8 oz (102.7 kg)  09/30/20 231 lb 12.8 oz (105.1 kg)   Physical Exam Vitals reviewed.  Constitutional:      Appearance: Normal appearance.  Cardiovascular:     Rate and Rhythm: Normal rate and regular rhythm.     Pulses: Normal pulses.     Heart sounds: Normal heart sounds.  Pulmonary:     Effort: Pulmonary effort is normal.     Breath sounds: Normal breath sounds.  Abdominal:     Palpations: Abdomen is soft. There is no hepatomegaly, splenomegaly or mass.     Tenderness: There is no abdominal tenderness.  Musculoskeletal:     Right lower leg: No edema.     Left lower leg: No edema.  Lymphadenopathy:     Cervical: No cervical adenopathy.     Right cervical: No superficial, deep or posterior cervical adenopathy.    Left cervical: No superficial, deep or posterior cervical adenopathy.     Upper Body:     Right upper body: No supraclavicular adenopathy.     Left upper body: No supraclavicular adenopathy.     Lower Body: No right inguinal adenopathy. No left inguinal adenopathy.  Neurological:     General: No focal deficit present.     Mental Status: He is alert and oriented to person, place, and time.  Psychiatric:        Mood and Affect: Mood normal.        Behavior: Behavior normal.     LABORATORY DATA:  I have reviewed the labs as listed.  CBC Latest Ref Rng & Units 02/24/2021 12/13/2020 10/28/2020  WBC 4.0 - 10.5 K/uL 2.5(L) 3.6(L) 2.8(L)  Hemoglobin 13.0 - 17.0 g/dL 11.7(L) 11.5(L) 11.3(L)  Hematocrit 39.0 - 52.0 % 33.3(L) 33.6(L) 33.6(L)  Platelets 150 - 400 K/uL 78(L) 78(L) 80(L)   CMP Latest Ref Rng & Units 02/24/2021 12/13/2020 10/28/2020  Glucose 70 - 99 mg/dL 105(H) 113(H) 110(H)  BUN 8 - 23 mg/dL 39(H) 57(H) 45(H)  Creatinine 0.61 - 1.24 mg/dL 3.66(H) 4.66(H) 4.43(H)  Sodium 135 - 145 mmol/L 138 136 137  Potassium 3.5 - 5.1  mmol/L  3.9 3.1(L) 4.2  Chloride 98 - 111 mmol/L 106 102 108  CO2 22 - 32 mmol/L 25 24 21(L)  Calcium 8.9 - 10.3 mg/dL 8.7(L) 8.5(L) 8.5(L)  Total Protein 6.5 - 8.1 g/dL 6.1(L) 6.3(L) 6.3(L)  Total Bilirubin 0.3 - 1.2 mg/dL 0.6 0.8 0.8  Alkaline Phos 38 - 126 U/L 59 65 63  AST 15 - 41 U/L _0 ALT 0 - 44 U/L _1 DIAGNOSTIC IMAGING:  I have independently reviewed the scans and discussed with the patient. No results found.   ASSESSMENT:  1.  CLL, Rai stage IV: -BMBX on 08/10/2016 shows bone marrow involvement by CLL. -He also had bulky diffuse adenopathy with progressive thrombocytopenia. -Imbruvica 420 mg from 08/19/2016 through May 2018, discontinued due to subdural hematoma on 10/14/2016. -Subsequently offered chlorambucil and obinutuzumab.  He refused obinutuzumab.  Chlorambucil 30 mg every 2 weeks from June 2019 through 11/07/2018 due to severe weakness. -PET scan on 12/01/2019 showed multiple bilateral enlarged cervical lymph nodes, bilateral axillary and mediastinal adenopathy, prevascular lymph nodes, left lower paratracheal lymph node, minimal adenopathy in the retroperitoneum, external iliac lymph nodes.  Spleen measures 16 cm.  All lymph nodes measure less than 5 cm with SUV less than 5. 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 -T p53 mutation is pending. -Venetoclax 10 mg started on 12/02/2019 (dose reduced secondary to Coreg), dose increased to 50 mg daily on 12/12/2019. -Dose increased to 100 mg daily on 12/19/2019, held on 12/25/2019 due to elevated creatinine. -Venetoclax 100 mg daily started back on 01/15/2020.  Dose increased to 200 mg daily on 07/01/2020.  Dose increased to 300 mg daily on 09/29/2020. - Rituximab 6 cycles completed on 07/29/2020. - CT CAP on 12/16/2020 showed complete resolution of lymphadenopathy in the chest.  Spleen size is normal.  Subcentimeter lymphadenopathy in the left external iliac region and left inguinal region.   2.   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   PLAN:  1.  CLL, Rai stage IV: - He has been tolerating venetoclax increase dose to 300 mg daily since 09/29/2020. - CT CAP on 12/16/2020 showed complete resolution of lymphadenopathy in the chest.  Spleen normal in size.  Very mild residual adenopathy with largest lymph node in the left external iliac lymph node region measuring 9 mm. - He reportedly started water aerobics and is expecting improvement in slight tiredness. - He denies any infections or B symptoms.  No palpable adenopathy or splenomegaly today. - Reviewed labs from 02/24/2021 which showed normal LFTs and electrolytes.  CBC shows white count is 2.5 with ANC of 1800.  Platelet count is 78 and stable.  Hemoglobin is 11.7. - Recommend continuing venetoclax 300 mg daily. - RTC 3 months for follow-up.  I plan to repeat CT CAP prior to next visit along with LDH.   2.  CKD: - He stopped taking HCTZ. - Creatinine has improved to 3.66 today from 4.66. - Continue follow-up with Dr. Marval Regal.   3.  Tumor lysis prophylaxis: - Uric acid is 6.5.  Allopurinol was discontinued.   4.  Normocytic anemia: - Last 24 on 07/29/2020.  He did not experience any bump in energy levels. - Hemoglobin today is 11.7.  Ferritin is 131 and percent saturation 25.  We will closely monitor.   Orders placed this encounter:  No orders of the defined types  were placed in this encounter.    Derek Jack, MD [facility] [phone]   I, Thana Ates, am acting as a scribe for Dr. Derek Jack.  I, Derek Jack MD, have reviewed the above documentation for accuracy and completeness, and I agree with the above.

## 2021-03-03 ENCOUNTER — Other Ambulatory Visit: Payer: Self-pay

## 2021-03-03 ENCOUNTER — Inpatient Hospital Stay (HOSPITAL_COMMUNITY): Payer: Medicare HMO | Attending: Hematology | Admitting: Hematology

## 2021-03-03 VITALS — BP 121/75 | HR 74 | Temp 97.0°F | Resp 20 | Wt 238.5 lb

## 2021-03-03 DIAGNOSIS — D509 Iron deficiency anemia, unspecified: Secondary | ICD-10-CM | POA: Diagnosis not present

## 2021-03-03 DIAGNOSIS — C9110 Chronic lymphocytic leukemia of B-cell type not having achieved remission: Secondary | ICD-10-CM | POA: Diagnosis not present

## 2021-03-03 DIAGNOSIS — Z9221 Personal history of antineoplastic chemotherapy: Secondary | ICD-10-CM | POA: Diagnosis not present

## 2021-03-03 NOTE — Patient Instructions (Signed)
[facility] at [facility] Discharge Instructions  You were seen and examined today by Dr. Delton Coombes. Return as scheduled for lab work and office visit. Continue Venetoclax as prescribed.    Thank you for choosing [facility] at [facility] to provide your oncology and hematology care.  To afford each patient quality time with our provider, please arrive at least 15 minutes before your scheduled appointment time.   If you have a lab appointment with the [facility] please come in thru the Main Entrance and check in at the main information desk.  You need to re-schedule your appointment should you arrive 10 or more minutes late.  We strive to give you quality time with our providers, and arriving late affects you and other patients whose appointments are after yours.  Also, if you no show three or more times for appointments you may be dismissed from the clinic at the providers discretion.     Again, thank you for choosing [facility].  Our hope is that these requests will decrease the amount of time that you wait before being seen by our physicians.       _____________________________________________________________  Should you have questions after your visit to [facility], please contact our office at [phone] and follow the prompts.  Our office hours are 8:00 a.m. and 4:30 p.m. Monday - Friday.  Please note that voicemails left after 4:00 p.m. may not be returned until the following business day.  We are closed weekends and major holidays.  You do have access to a nurse 24-7, just call the main number to the clinic [phone] and do not press any options, hold on the line and a nurse will answer the phone.    For prescription refill requests, have your pharmacy contact our office and allow 72 hours.    Due to Covid, you will need to wear a mask upon entering the hospital. If you do not have a mask, a mask will be  given to you at the Main Entrance upon arrival. For doctor visits, patients may have 1 support person age 28 or older with them. For treatment visits, patients can not have anyone with them due to social distancing guidelines and our immunocompromised population.

## 2021-03-22 ENCOUNTER — Other Ambulatory Visit (HOSPITAL_COMMUNITY): Payer: Self-pay

## 2021-03-28 DIAGNOSIS — E782 Mixed hyperlipidemia: Secondary | ICD-10-CM | POA: Diagnosis not present

## 2021-03-28 DIAGNOSIS — I10 Essential (primary) hypertension: Secondary | ICD-10-CM | POA: Diagnosis not present

## 2021-03-29 DIAGNOSIS — J069 Acute upper respiratory infection, unspecified: Secondary | ICD-10-CM | POA: Diagnosis not present

## 2021-03-31 ENCOUNTER — Other Ambulatory Visit (HOSPITAL_COMMUNITY): Payer: Self-pay

## 2021-04-26 ENCOUNTER — Other Ambulatory Visit (HOSPITAL_COMMUNITY): Payer: Self-pay

## 2021-04-27 DIAGNOSIS — E782 Mixed hyperlipidemia: Secondary | ICD-10-CM | POA: Diagnosis not present

## 2021-04-27 DIAGNOSIS — I10 Essential (primary) hypertension: Secondary | ICD-10-CM | POA: Diagnosis not present

## 2021-04-29 DIAGNOSIS — N390 Urinary tract infection, site not specified: Secondary | ICD-10-CM | POA: Diagnosis not present

## 2021-04-29 DIAGNOSIS — I770 Arteriovenous fistula, acquired: Secondary | ICD-10-CM | POA: Diagnosis not present

## 2021-04-29 DIAGNOSIS — N200 Calculus of kidney: Secondary | ICD-10-CM | POA: Diagnosis not present

## 2021-04-29 DIAGNOSIS — N179 Acute kidney failure, unspecified: Secondary | ICD-10-CM | POA: Diagnosis not present

## 2021-04-29 DIAGNOSIS — Z8616 Personal history of COVID-19: Secondary | ICD-10-CM | POA: Diagnosis not present

## 2021-04-29 DIAGNOSIS — N184 Chronic kidney disease, stage 4 (severe): Secondary | ICD-10-CM | POA: Diagnosis not present

## 2021-04-29 DIAGNOSIS — N12 Tubulo-interstitial nephritis, not specified as acute or chronic: Secondary | ICD-10-CM | POA: Diagnosis not present

## 2021-04-29 DIAGNOSIS — I129 Hypertensive chronic kidney disease with stage 1 through stage 4 chronic kidney disease, or unspecified chronic kidney disease: Secondary | ICD-10-CM | POA: Diagnosis not present

## 2021-04-29 DIAGNOSIS — C9110 Chronic lymphocytic leukemia of B-cell type not having achieved remission: Secondary | ICD-10-CM | POA: Diagnosis not present

## 2021-04-29 DIAGNOSIS — D696 Thrombocytopenia, unspecified: Secondary | ICD-10-CM | POA: Diagnosis not present

## 2021-05-02 ENCOUNTER — Other Ambulatory Visit (HOSPITAL_COMMUNITY): Payer: Self-pay

## 2021-05-27 ENCOUNTER — Other Ambulatory Visit (HOSPITAL_COMMUNITY): Payer: Self-pay | Admitting: Hematology

## 2021-05-27 ENCOUNTER — Other Ambulatory Visit (HOSPITAL_COMMUNITY): Payer: Self-pay

## 2021-05-27 DIAGNOSIS — C9110 Chronic lymphocytic leukemia of B-cell type not having achieved remission: Secondary | ICD-10-CM

## 2021-05-27 DIAGNOSIS — I10 Essential (primary) hypertension: Secondary | ICD-10-CM | POA: Diagnosis not present

## 2021-05-27 DIAGNOSIS — E782 Mixed hyperlipidemia: Secondary | ICD-10-CM | POA: Diagnosis not present

## 2021-05-27 MED ORDER — VENETOCLAX 100 MG PO TABS
300.0000 mg | ORAL_TABLET | Freq: Every day | ORAL | 3 refills | Status: DC
  Filled 2021-05-27: qty 90, 30d supply, fill #0
  Filled 2021-06-22: qty 90, 30d supply, fill #1
  Filled 2021-07-27: qty 90, 30d supply, fill #2
  Filled 2021-08-19: qty 90, 30d supply, fill #3

## 2021-05-30 ENCOUNTER — Other Ambulatory Visit (HOSPITAL_COMMUNITY): Payer: Self-pay

## 2021-06-06 ENCOUNTER — Inpatient Hospital Stay (HOSPITAL_COMMUNITY): Payer: Medicare HMO | Attending: Hematology

## 2021-06-06 ENCOUNTER — Other Ambulatory Visit: Payer: Self-pay

## 2021-06-06 ENCOUNTER — Ambulatory Visit (HOSPITAL_COMMUNITY)
Admission: RE | Admit: 2021-06-06 | Discharge: 2021-06-06 | Disposition: A | Discharge status: Home / Self Care | Payer: Medicare HMO | Source: Ambulatory Visit | Attending: Hematology | Admitting: Hematology

## 2021-06-06 DIAGNOSIS — K5730 Diverticulosis of large intestine without perforation or abscess without bleeding: Secondary | ICD-10-CM | POA: Diagnosis not present

## 2021-06-06 DIAGNOSIS — C9110 Chronic lymphocytic leukemia of B-cell type not having achieved remission: Secondary | ICD-10-CM | POA: Insufficient documentation

## 2021-06-06 DIAGNOSIS — D649 Anemia, unspecified: Secondary | ICD-10-CM | POA: Insufficient documentation

## 2021-06-06 DIAGNOSIS — Z809 Family history of malignant neoplasm, unspecified: Secondary | ICD-10-CM | POA: Insufficient documentation

## 2021-06-06 DIAGNOSIS — I129 Hypertensive chronic kidney disease with stage 1 through stage 4 chronic kidney disease, or unspecified chronic kidney disease: Secondary | ICD-10-CM | POA: Insufficient documentation

## 2021-06-06 DIAGNOSIS — D509 Iron deficiency anemia, unspecified: Secondary | ICD-10-CM

## 2021-06-06 DIAGNOSIS — Z87891 Personal history of nicotine dependence: Secondary | ICD-10-CM | POA: Insufficient documentation

## 2021-06-06 DIAGNOSIS — N184 Chronic kidney disease, stage 4 (severe): Secondary | ICD-10-CM | POA: Insufficient documentation

## 2021-06-06 DIAGNOSIS — N200 Calculus of kidney: Secondary | ICD-10-CM | POA: Diagnosis not present

## 2021-06-06 DIAGNOSIS — E890 Postprocedural hypothyroidism: Secondary | ICD-10-CM | POA: Diagnosis not present

## 2021-06-06 DIAGNOSIS — J432 Centrilobular emphysema: Secondary | ICD-10-CM | POA: Diagnosis not present

## 2021-06-06 DIAGNOSIS — I2510 Atherosclerotic heart disease of native coronary artery without angina pectoris: Secondary | ICD-10-CM | POA: Diagnosis not present

## 2021-06-06 DIAGNOSIS — K449 Diaphragmatic hernia without obstruction or gangrene: Secondary | ICD-10-CM | POA: Diagnosis not present

## 2021-06-06 LAB — IRON AND TIBC
Iron: 80 ug/dL (ref 45–182)
Saturation Ratios: 26 % (ref 17.9–39.5)
TIBC: 311 ug/dL (ref 250–450)
UIBC: 231 ug/dL

## 2021-06-06 LAB — COMPREHENSIVE METABOLIC PANEL
ALT: 14 U/L (ref 0–44)
AST: 16 U/L (ref 15–41)
Albumin: 4.1 g/dL (ref 3.5–5.0)
Alkaline Phosphatase: 67 U/L (ref 38–126)
Anion gap: 8 (ref 5–15)
BUN: 34 mg/dL — ABNORMAL HIGH (ref 8–23)
CO2: 23 mmol/L (ref 22–32)
Calcium: 8.5 mg/dL — ABNORMAL LOW (ref 8.9–10.3)
Chloride: 105 mmol/L (ref 98–111)
Creatinine, Ser: 3.59 mg/dL — ABNORMAL HIGH (ref 0.61–1.24)
GFR, Estimated: 16 mL/min — ABNORMAL LOW (ref >60)
Glucose, Bld: 101 mg/dL — ABNORMAL HIGH (ref 70–99)
Potassium: 4.1 mmol/L (ref 3.5–5.1)
Sodium: 136 mmol/L (ref 135–145)
Total Bilirubin: 1 mg/dL (ref 0.3–1.2)
Total Protein: 6.6 g/dL (ref 6.5–8.1)

## 2021-06-06 LAB — CBC WITH DIFFERENTIAL/PLATELET
Abs Immature Granulocytes: 0.02 10*3/uL (ref 0.00–0.07)
Basophils Absolute: 0 10*3/uL (ref 0.0–0.1)
Basophils Relative: 0 %
Eosinophils Absolute: 0.1 10*3/uL (ref 0.0–0.5)
Eosinophils Relative: 2 %
HCT: 35.9 % — ABNORMAL LOW (ref 39.0–52.0)
Hemoglobin: 12.4 g/dL — ABNORMAL LOW (ref 13.0–17.0)
Immature Granulocytes: 1 %
Lymphocytes Relative: 11 %
Lymphs Abs: 0.3 10*3/uL — ABNORMAL LOW (ref 0.7–4.0)
MCH: 35.2 pg — ABNORMAL HIGH (ref 26.0–34.0)
MCHC: 34.5 g/dL (ref 30.0–36.0)
MCV: 102 fL — ABNORMAL HIGH (ref 80.0–100.0)
Monocytes Absolute: 0.4 10*3/uL (ref 0.1–1.0)
Monocytes Relative: 15 %
Neutro Abs: 2 10*3/uL (ref 1.7–7.7)
Neutrophils Relative %: 71 %
Platelets: 90 10*3/uL — ABNORMAL LOW (ref 150–400)
RBC: 3.52 MIL/uL — ABNORMAL LOW (ref 4.22–5.81)
RDW: 13.3 % (ref 11.5–15.5)
WBC: 2.8 10*3/uL — ABNORMAL LOW (ref 4.0–10.5)
nRBC: 0 % (ref 0.0–0.2)

## 2021-06-06 LAB — URIC ACID: Uric Acid, Serum: 6.2 mg/dL (ref 3.7–8.6)

## 2021-06-06 LAB — FERRITIN: Ferritin: 120 ng/mL (ref 24–336)

## 2021-06-06 LAB — LACTATE DEHYDROGENASE: LDH: 136 U/L (ref 98–192)

## 2021-06-06 LAB — MAGNESIUM: Magnesium: 2.4 mg/dL (ref 1.7–2.4)

## 2021-06-14 ENCOUNTER — Other Ambulatory Visit: Payer: Self-pay

## 2021-06-14 ENCOUNTER — Inpatient Hospital Stay (HOSPITAL_COMMUNITY): Payer: Medicare HMO | Admitting: Hematology

## 2021-06-14 VITALS — BP 144/84 | HR 72 | Temp 96.9°F | Resp 18 | Wt 229.6 lb

## 2021-06-14 DIAGNOSIS — C9110 Chronic lymphocytic leukemia of B-cell type not having achieved remission: Secondary | ICD-10-CM | POA: Diagnosis not present

## 2021-06-14 DIAGNOSIS — D509 Iron deficiency anemia, unspecified: Secondary | ICD-10-CM

## 2021-06-14 DIAGNOSIS — I129 Hypertensive chronic kidney disease with stage 1 through stage 4 chronic kidney disease, or unspecified chronic kidney disease: Secondary | ICD-10-CM | POA: Diagnosis not present

## 2021-06-14 DIAGNOSIS — D649 Anemia, unspecified: Secondary | ICD-10-CM | POA: Diagnosis not present

## 2021-06-14 DIAGNOSIS — N184 Chronic kidney disease, stage 4 (severe): Secondary | ICD-10-CM | POA: Diagnosis not present

## 2021-06-14 DIAGNOSIS — Z87891 Personal history of nicotine dependence: Secondary | ICD-10-CM | POA: Diagnosis not present

## 2021-06-14 DIAGNOSIS — Z809 Family history of malignant neoplasm, unspecified: Secondary | ICD-10-CM | POA: Diagnosis not present

## 2021-06-14 NOTE — Progress Notes (Signed)
[facility] [address], [place] [address]   CLINIC:  Medical Oncology/Hematology  PCP:  [name], MD [address] [place] [address] [phone]   REASON FOR VISIT:  Follow-up for CLL  PRIOR THERAPY:  1. Imbruvica 420 mg from 08/19/2016 through 09/2016. 2. Chlorambucil 30 mg from 10/2017 through 11/07/2018.  NGS Results: not done  CURRENT THERAPY: Venetoclax 200 mg daily  BRIEF ONCOLOGIC HISTORY:  Oncology History  CLL (chronic lymphocytic leukemia) ([place])  03/09/2015 Initial Diagnosis   CLL (chronic lymphocytic leukemia) ([place])   08/03/2016 Imaging   CT neck- 1. Diffuse and bulky lymphadenopathy in keeping with history of CLL. Nodal enlargement has diffusely progressed since April 2016. 2. Chest and abdomen CT reported separately.   08/03/2016 Imaging   CT CAP- 1. Bulky lymphadenopathy in the chest, abdomen, and pelvis as described. 2. Splenomegaly. 3. Coronary artery and thoracoabdominal aortic atherosclerosis. 4. Prostatomegaly. 5. Several tiny lucent lesions in the bony anatomic pelvis, nonspecific, but attention on follow-up imaging recommended.   08/10/2016 Procedure   Bone marrow aspiration and biopsy by IR.   08/11/2016 Pathology Results   Bone Marrow, Aspirate,Biopsy, and Clot, left iliac crest - MARKED INVOLVEMENT BY CHRONIC LYMPHOCYTIC LEUKEMIA/SMALL LYMPHOCYTIC LYMPHOMA. PERIPHERAL BLOOD: - CHRONIC LYMPHOCYTIC LEUKEMIA. - THROMBOCYTOPENIA.   08/11/2016 Pathology Results   Bone Marrow Flow Cytometry - CHRONIC LYMPHOCYTIC LEUKEMIA/SMALL LYMPHOCYTIC LYMPHOMA.   08/16/2016 Pathology Results   Normal cytogenetics.  Molecular cytogenetic analysis by [facility] demonstrates a 13q-   08/19/2016 -  Chemotherapy   Imbruvica 420 mg daily    03/11/2020 -  Chemotherapy      Patient is on Antibody Plan: CLL RITUXIMAB Q28D       CANCER STAGING:  Cancer Staging  CLL (chronic lymphocytic leukemia) ([place]) Staging form:  Chronic Lymphocytic Leukemia / Small Lymphocytic Lymphoma, AJCC 8th Edition - Clinical stage from 09/04/2016: Modified Rai Stage IV (Modified Rai risk: High, Binet: Stage C, Lymphocytosis: Present, Adenopathy: Present, Organomegaly: Present, Anemia: Absent, Thrombocytopenia: Present) - Signed by Baird Cancer, PA-C on 09/04/2016   INTERVAL HISTORY:  [name], a 81 y.o. male, returns for routine follow-up of his CLL. Benjermin was last seen on 03/03/2021.   Today he reports feeling good. He reports intentionally trying to lose weight through diet changes. He is no longer taking HCTZ. He denies hematochezia. He denies n/v/d. He denies recent infections.   REVIEW OF SYSTEMS:  Review of Systems  Constitutional:  Negative for appetite change and fatigue.  Respiratory:  Positive for shortness of breath.   Gastrointestinal:  Negative for blood in stool, diarrhea, nausea and vomiting.  Genitourinary:  Positive for frequency.   Neurological:  Positive for numbness.  All other systems reviewed and are negative.  PAST MEDICAL/SURGICAL HISTORY:  Past Medical History:  Diagnosis Date   Arthritis    Ascending aortic aneurysm (HCC)    CKD (chronic kidney disease), stage IV ([place])    followed by [facility]   CLL (chronic lymphocytic leukemia) ([place])    Complication of anesthesia    "wild when I woke up "   History of kidney stones    passed    Hypertension    NICM (nonischemic cardiomyopathy) ([place])    a. cath 2007, EF 35%, minimal CAD. b. EF normal 2018.   PAF (paroxysmal atrial fibrillation) (HCC)    Pinched nerve in neck    Pneumonia    SDH (subdural hematoma) (HCC)  Thrombocytopenia ([place])    Past Surgical History:  Procedure Laterality Date   AV FISTULA PLACEMENT Left 04/30/2020   Procedure: LEFT ARM BRACHIOCEPHALIC ARTERIOVENOUS (AV) FISTULA CREATION;  Surgeon: Serafina Mitchell, MD;  Location: [facility];  Service: Vascular;  Laterality: Left;   BIOPSY  08/05/2018   Procedure:  BIOPSY;  Surgeon: Danie Binder, MD;  [facility];  Service: Endoscopy;;  gastric    Bone marrow biospy Bilateral    CATARACT EXTRACTION, BILATERAL     COLONOSCOPY  07/10/2011   Procedure: COLONOSCOPY;  Surgeon: Dorothyann Peng, MD;  [facility];  Service: Endoscopy;  Laterality: N/A;  10:30 AM   CRANIOTOMY N/A 10/14/2016   Procedure: CRANIOTOMY HEMATOMA EVACUATION SUBDURAL;  Surgeon: Jovita Gamma, MD;  Location: [facility];  Service: Neurosurgery;  Laterality: N/A;  CRANIOTOMY HEMATOMA EVACUATION SUBDURAL   ESOPHAGOGASTRODUODENOSCOPY N/A 08/05/2018   Procedure: ESOPHAGOGASTRODUODENOSCOPY (EGD);  Surgeon: Danie Binder, MD;  [facility];  Service: Endoscopy;  Laterality: N/A;  3:00pm   THYROIDECTOMY, PARTIAL     TONSILLECTOMY      SOCIAL HISTORY:  Social History   Socioeconomic History   Marital status: Married    Spouse name: Not on file   Number of children: Not on file   Years of education: Not on file   Highest education level: Not on file  Occupational History   Not on file  Tobacco Use   Smoking status: Former    Packs/day: 1.00    Years: 15.00    Pack years: 15.00    Types: Cigarettes    Quit date: 01/26/1971    Years since quitting: 50.4   Smokeless tobacco: Never  Vaping Use   Vaping Use: Never used  Substance and Sexual Activity   Alcohol use: No   Drug use: No   Sexual activity: Not on file  Other Topics Concern   Not on file  Social History Narrative   Not on file   Social Determinants of Health   Financial Resource Strain: Low Risk    Difficulty of Paying Living Expenses: Not hard at all  Food Insecurity: No Food Insecurity   Worried About Charity fundraiser in the Last Year: Never true   Banks in the Last Year: Never true  Transportation Needs: No Transportation Needs   Lack of Transportation (Medical): No   Lack of Transportation (Non-Medical): No  Physical Activity: Inactive   Days of Exercise per  Week: 0 days   Minutes of Exercise per Session: 0 min  Stress: No Stress Concern Present   Feeling of Stress : Not at all  Social Connections: Moderately Integrated   Frequency of Communication with Friends and Family: More than three times a week   Frequency of Social Gatherings with Friends and Family: More than three times a week   Attends Religious Services: More than 4 times per year   Active Member of [employer] or Organizations: No   Attends Music therapist: Never   Marital Status: Married  Human resources officer Violence: Not At Risk   Fear of Current or Ex-Partner: No   Emotionally Abused: No   Physically Abused: No   Sexually Abused: No    FAMILY HISTORY:  Family History  Problem Relation Age of Onset   CAD Father 22   Cancer Mother    Alzheimer's disease Mother    Colon cancer Neg Hx     CURRENT MEDICATIONS:  Current Outpatient Medications  Medication Sig Dispense Refill   amLODipine (NORVASC) 5 MG tablet Take 5 mg by mouth daily.     carvedilol (COREG) 6.25 MG tablet Take 1 tablet (6.25 mg total) by mouth 2 (two) times daily. 180 tablet 3   Cholecalciferol (VITAMIN D3) 25 MCG (1000 UT) CAPS Take 1 capsule by mouth daily.     Cyanocobalamin (VITAMIN B12) 1000 MCG TBCR Take 1,000 mcg by mouth daily.      hydrochlorothiazide (HYDRODIURIL) 12.5 MG tablet Take 12.5 mg by mouth daily.     Multiple Vitamin (MULITIVITAMIN WITH MINERALS) TABS Take 1 tablet by mouth daily.     omeprazole (PRILOSEC) 20 MG capsule 1 PO 30 MINS PRIOR TO BREAKFAST. (Patient taking differently: Take 20 mg by mouth daily.) 90 capsule 3   sodium bicarbonate 325 MG tablet Take 325 mg by mouth 3 (three) times daily.     tamsulosin (FLOMAX) 0.4 MG CAPS capsule Take 0.4 mg by mouth daily.      venetoclax (VENCLEXTA) 100 MG tablet Take 3 tablets (300 mg total) by mouth daily. 90 tablet 3   No current facility-administered medications for this visit.    ALLERGIES:  No Known Allergies  PHYSICAL  EXAM:  Performance status (ECOG): 1 - Symptomatic but completely ambulatory  Vitals:   06/14/21 1520  BP: (!) 144/84  Pulse: 72  Resp: 18  Temp: (!) 96.9 F (36.1 C)  SpO2: 95%   Wt Readings from Last 3 Encounters:  06/14/21 229 lb 9.6 oz (104.1 kg)  03/03/21 238 lb 8.6 oz (108.2 kg)  12/20/20 220 lb 8 oz (100 kg)   Physical Exam Vitals reviewed.  Constitutional:      Appearance: Normal appearance.  Cardiovascular:     Rate and Rhythm: Normal rate and regular rhythm.     Pulses: Normal pulses.     Heart sounds: Normal heart sounds.  Pulmonary:     Effort: Pulmonary effort is normal.     Breath sounds: Normal breath sounds.  Musculoskeletal:     Right lower leg: No edema.     Left lower leg: No edema.  Neurological:     General: No focal deficit present.     Mental Status: He is alert and oriented to person, place, and time.  Psychiatric:        Mood and Affect: Mood normal.        Behavior: Behavior normal.     LABORATORY DATA:  I have reviewed the labs as listed.  CBC Latest Ref Rng & Units 06/06/2021 02/24/2021 12/13/2020  WBC 4.0 - 10.5 K/uL 2.8(L) 2.5(L) 3.6(L)  Hemoglobin 13.0 - 17.0 g/dL 12.4(L) 11.7(L) 11.5(L)  Hematocrit 39.0 - 52.0 % 35.9(L) 33.3(L) 33.6(L)  Platelets 150 - 400 K/uL 90(L) 78(L) 78(L)   CMP Latest Ref Rng & Units 06/06/2021 02/24/2021 12/13/2020  Glucose 70 - 99 mg/dL 101(H) 105(H) 113(H)  BUN 8 - 23 mg/dL 34(H) 39(H) 57(H)  Creatinine 0.61 - 1.24 mg/dL 3.59(H) 3.66(H) 4.66(H)  Sodium 135 - 145 mmol/L 136 138 136  Potassium 3.5 - 5.1 mmol/L 4.1 3.9 3.1(L)  Chloride 98 - 111 mmol/L 105 106 102  CO2 22 - 32 mmol/L _0 Calcium 8.9 - 10.3 mg/dL 8.5(L) 8.7(L) 8.5(L)  Total Protein 6.5 - 8.1 g/dL 6.6 6.1(L) 6.3(L)  Total Bilirubin 0.3 - 1.2 mg/dL 1.0 0.6 0.8  Alkaline Phos 38 - 126 U/L 67 59 65  AST 15 - 41 U/L _1 ALT 0 - 44  U/L _0 DIAGNOSTIC IMAGING:  I have independently reviewed the scans and discussed with the  patient. CT CHEST ABDOMEN PELVIS WO CONTRAST  Result Date: 06/06/2021 CLINICAL DATA:  History of chronic lymphocytic leukemia diagnosed in 2016, currently on immunotherapy. EXAM: CT CHEST, ABDOMEN AND PELVIS WITHOUT CONTRAST TECHNIQUE: Multidetector CT imaging of the chest, abdomen and pelvis was performed following the standard protocol without IV contrast. COMPARISON:  Multiple priors including most recent CT December 16, 2020 FINDINGS: CT CHEST FINDINGS Cardiovascular: Aortic atherosclerosis without aneurysmal dilation. Borderline cardiac enlargement. Three-vessel coronary artery calcifications. Trace pericardial effusion is similar prior and favored physiologic. Mediastinum/Nodes: Prior right thyroidectomy. No pathologically enlarged mediastinal, hilar or axillary lymph nodes, noting limited sensitivity for the detection of hilar adenopathy on this noncontrast study. Small hiatal hernia. Lungs/Pleura: Mild centrilobular and paraseptal emphysema. No suspicious pulmonary nodules or masses. No pleural effusion. No pneumothorax. Musculoskeletal: Multilevel degenerative changes spine. No aggressive lytic or blastic lesion of bone. CT ABDOMEN PELVIS FINDINGS Hepatobiliary: Small low-density lesions in the left hepatic lobe are stable and likely cysts. No new suspicious finding on this noncontrast examination. Gallbladder is unremarkable. No biliary ductal dilation. Pancreas: No pancreatic ductal dilation or evidence of acute inflammation. Spleen: Normal in size without focal abnormality. Adrenals/Urinary Tract: Bilateral adrenal glands appear normal. Similar bilateral renal cortical thinning. Nonobstructive bilateral renal calculi measuring up to 5 mm. No obstructive ureteral or bladder calculi identified. Stable mild thickening of the urinary bladder wall, likely related to chronic outflow impedance. Stomach/Bowel: Radiopaque enteric contrast material traverses the descending colon. Stomach is predominantly  decompressed limiting evaluation the wall. No pathologic dilation of small or large bowel. The appendix and terminal ileum appear normal. Left-sided colonic diverticulosis without findings of acute diverticulitis. Vascular/Lymphatic: No significant interval change in the prominent pelvic lymph nodes. Previously indexed lymph nodes are as follows: Left external iliac lymph node measures 8 mm in short axis on image 109/2, previously 9 mm. Left external iliac lymph node measures 7 mm in short axis on image 110/2, unchanged. Left inguinal lymph node measures 9 mm in short axis on image 131/2, unchanged. No new abdominal or pelvic adenopathy. Aortic and branch vessel atherosclerosis without abdominal aortic aneurysm. Reproductive: Prostatomegaly. Other: No significant abdominopelvic free fluid. Musculoskeletal: Multilevel degenerative changes spine. Degenerative changes bilateral hips. No aggressive lytic or blastic lesion of bone. IMPRESSION: 1. Stable examination including prominent pelvic lymph nodes, without pathologically enlarged lymph nodes above or below the diaphragm and no splenomegaly. 2. Nonobstructive bilateral renal calculi measuring up to 5 mm. 3. Left-sided colonic diverticulosis without findings of acute diverticulitis. 4.  Aortic Atherosclerosis (ICD10-I70.0). Electronically Signed   By: Dahlia Bailiff M.D.   On: 06/06/2021 09:43     ASSESSMENT:  1.  CLL, Rai stage IV: -BMBX on 08/10/2016 shows bone marrow involvement by CLL. -He also had bulky diffuse adenopathy with progressive thrombocytopenia. -Imbruvica 420 mg from 08/19/2016 through May 2018, discontinued due to subdural hematoma on 10/14/2016. -Subsequently offered chlorambucil and obinutuzumab.  He refused obinutuzumab.  Chlorambucil 30 mg every 2 weeks from June 2019 through 11/07/2018 due to severe weakness. -PET scan on 12/01/2019 showed multiple bilateral enlarged cervical lymph nodes, bilateral axillary and mediastinal adenopathy,  prevascular lymph nodes, left lower paratracheal lymph node, minimal adenopathy in the retroperitoneum, external iliac lymph nodes.  Spleen measures 16 cm.  All lymph nodes measure less than 5 cm with SUV less than 5. 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 -T p53 mutation is pending. -Venetoclax 10 mg started on 12/02/2019 (dose reduced secondary to Coreg), dose increased to 50 mg daily on 12/12/2019. -Dose increased to 100 mg daily on 12/19/2019, held on 12/25/2019 due to elevated creatinine. -Venetoclax 100 mg daily started back on 01/15/2020.  Dose increased to 200 mg daily on 07/01/2020.  Dose increased to 300 mg daily on 09/29/2020. - Rituximab 6 cycles completed on 07/29/2020. - CT CAP on 12/16/2020 showed complete resolution of lymphadenopathy in the chest.  Spleen size is normal.  Subcentimeter lymphadenopathy in the left external iliac region and left inguinal region.   2.  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   PLAN:  1.  CLL, Rai stage IV: - He is taking venetoclax 300 mg daily since 09/29/2020. - We reviewed CT CAP from 06/06/2021 which showed stable subcentimeter prominent pelvic lymph nodes without pathologically enlarged lymph nodes above or below the diaphragm.  No splenomegaly.  Nonobstructive bilateral kidney stones measuring up to 5 mm. - Reviewed labs from 06/06/2021 which showed normal LFTs and electrolytes.  CBC shows leukopenia 2.8 stable.  Platelet count is also stable around 90. - Continue venetoclax 300 mg daily.  No infections reported. - RTC 3 months for follow-up with repeat labs.  We will plan on repeating CT scan in 6 months or longer.   2.  CKD: - Creatinine is 3.59 today. - Continue follow-up with Dr. Marval Regal.   3.  Tumor lysis prophylaxis: - Uric acid is 6.2.  Allopurinol was discontinued previously.   4.  Normocytic anemia: - CBC on  06/06/2021 shows hemoglobin 12.4.  Ferritin was 120 with percent saturation 26.   Orders placed this encounter:  No orders of the defined types were placed in this encounter.    Derek Jack, MD [place] [phone]   I, Thana Ates, am acting as a scribe for Dr. Derek Jack.  I, Derek Jack MD, have reviewed the above documentation for accuracy and completeness, and I agree with the above.

## 2021-06-14 NOTE — Patient Instructions (Signed)
[place] at [facility] Discharge Instructions  You were seen and examined today by Dr. Delton Coombes. He reviewed your most recent labs and scan and everything looks okay. Please keep follow up appointment as scheduled in 3 months.   Thank you for choosing [place] at [facility] to provide your oncology and hematology care.  To afford each patient quality time with our provider, please arrive at least 15 minutes before your scheduled appointment time.   If you have a lab appointment with the [facility] please come in thru the Main Entrance and check in at the main information desk.  You need to re-schedule your appointment should you arrive 10 or more minutes late.  We strive to give you quality time with our providers, and arriving late affects you and other patients whose appointments are after yours.  Also, if you no show three or more times for appointments you may be dismissed from the clinic at the providers discretion.     Again, thank you for choosing [facility].  Our hope is that these requests will decrease the amount of time that you wait before being seen by our physicians.       _____________________________________________________________  Should you have questions after your visit to [facility], please contact our office at [phone] and follow the prompts.  Our office hours are 8:00 a.m. and 4:30 p.m. Monday - Friday.  Please note that voicemails left after 4:00 p.m. may not be returned until the following business day.  We are closed weekends and major holidays.  You do have access to a nurse 24-7, just call the main number to the clinic [phone] and do not press any options, hold on the line and a nurse will answer the phone.    For prescription refill requests, have your pharmacy contact our office and allow 72 hours.    Due to Covid, you will need to wear a mask upon entering the hospital.  If you do not have a mask, a mask will be given to you at the Main Entrance upon arrival. For doctor visits, patients may have 1 support person age 56 or older with them. For treatment visits, patients can not have anyone with them due to social distancing guidelines and our immunocompromised population.

## 2021-06-22 ENCOUNTER — Other Ambulatory Visit (HOSPITAL_COMMUNITY): Payer: Self-pay

## 2021-06-26 DIAGNOSIS — E785 Hyperlipidemia, unspecified: Secondary | ICD-10-CM | POA: Diagnosis not present

## 2021-06-26 DIAGNOSIS — I10 Essential (primary) hypertension: Secondary | ICD-10-CM | POA: Diagnosis not present

## 2021-06-28 DIAGNOSIS — E785 Hyperlipidemia, unspecified: Secondary | ICD-10-CM | POA: Diagnosis not present

## 2021-06-28 DIAGNOSIS — I10 Essential (primary) hypertension: Secondary | ICD-10-CM | POA: Diagnosis not present

## 2021-06-29 ENCOUNTER — Other Ambulatory Visit (HOSPITAL_COMMUNITY): Payer: Self-pay

## 2021-07-04 DIAGNOSIS — N12 Tubulo-interstitial nephritis, not specified as acute or chronic: Secondary | ICD-10-CM | POA: Diagnosis not present

## 2021-07-04 DIAGNOSIS — N179 Acute kidney failure, unspecified: Secondary | ICD-10-CM | POA: Diagnosis not present

## 2021-07-04 DIAGNOSIS — I770 Arteriovenous fistula, acquired: Secondary | ICD-10-CM | POA: Diagnosis not present

## 2021-07-04 DIAGNOSIS — Z8616 Personal history of COVID-19: Secondary | ICD-10-CM | POA: Diagnosis not present

## 2021-07-04 DIAGNOSIS — N184 Chronic kidney disease, stage 4 (severe): Secondary | ICD-10-CM | POA: Diagnosis not present

## 2021-07-04 DIAGNOSIS — N200 Calculus of kidney: Secondary | ICD-10-CM | POA: Diagnosis not present

## 2021-07-04 DIAGNOSIS — C9110 Chronic lymphocytic leukemia of B-cell type not having achieved remission: Secondary | ICD-10-CM | POA: Diagnosis not present

## 2021-07-04 DIAGNOSIS — D696 Thrombocytopenia, unspecified: Secondary | ICD-10-CM | POA: Diagnosis not present

## 2021-07-04 DIAGNOSIS — I129 Hypertensive chronic kidney disease with stage 1 through stage 4 chronic kidney disease, or unspecified chronic kidney disease: Secondary | ICD-10-CM | POA: Diagnosis not present

## 2021-07-18 ENCOUNTER — Other Ambulatory Visit (HOSPITAL_COMMUNITY): Payer: Self-pay

## 2021-07-18 DIAGNOSIS — R059 Cough, unspecified: Secondary | ICD-10-CM | POA: Diagnosis not present

## 2021-07-20 ENCOUNTER — Other Ambulatory Visit (HOSPITAL_COMMUNITY): Payer: Self-pay

## 2021-07-21 ENCOUNTER — Other Ambulatory Visit (HOSPITAL_COMMUNITY): Payer: Self-pay

## 2021-07-26 DIAGNOSIS — E119 Type 2 diabetes mellitus without complications: Secondary | ICD-10-CM | POA: Diagnosis not present

## 2021-07-26 DIAGNOSIS — I10 Essential (primary) hypertension: Secondary | ICD-10-CM | POA: Diagnosis not present

## 2021-07-27 ENCOUNTER — Other Ambulatory Visit (HOSPITAL_COMMUNITY): Payer: Self-pay

## 2021-07-27 ENCOUNTER — Telehealth: Payer: Self-pay | Admitting: Pharmacy Technician

## 2021-07-27 NOTE — Telephone Encounter (Signed)
Oral Oncology Patient Advocate Encounter ? ?Spoke with patient over the phone and obtained verbal consent to complete application for [facility] Patient Assistance in an effort to reduce patient's out of pocket expense for Venclexta to $0.   ? ?Patient application completed and faxed to [phone].  ? ?HCP portion faxed to [phone]. ? ?[employer] patient assistance phone number for follow up is [phone].  ? ?This encounter will be updated until final determination.  ? ?Dennison Nancy CPHT ?Specialty Pharmacy Patient Advocate ?[place] ?Phone [phone] ?Fax [phone] ?07/27/2021 2:53 PM ? ? ? ?

## 2021-08-01 ENCOUNTER — Other Ambulatory Visit (HOSPITAL_COMMUNITY): Payer: Self-pay

## 2021-08-01 ENCOUNTER — Telehealth (HOSPITAL_COMMUNITY): Payer: Self-pay | Admitting: Pharmacy Technician

## 2021-08-01 ENCOUNTER — Encounter (HOSPITAL_COMMUNITY): Payer: Self-pay | Admitting: Hematology

## 2021-08-01 NOTE — Telephone Encounter (Signed)
Oral Oncology Patient Advocate Encounter ?  ?Was successful in securing patient an $53 grant from Patient [place] [facility]) to provide copayment coverage for Venclexta.  This will keep the out of pocket expense at $0.   ? ?The billing information is as follows and has been shared with [facility] [place].  ? ?Member ID: [id number] ?Group ID: [id number] ?RxBin: [id number] ?Dates of Eligibility: 08/01/21 through 08/01/22 ? ?Fund:  CLL ? ?Dennison Nancy CPHT ?Specialty Pharmacy Patient Advocate ?[place] ?Phone [phone] ?Fax [phone] ?08/01/2021 4:14 PM ? ? ? ?

## 2021-08-02 ENCOUNTER — Other Ambulatory Visit (HOSPITAL_COMMUNITY): Payer: Self-pay

## 2021-08-02 DIAGNOSIS — L570 Actinic keratosis: Secondary | ICD-10-CM | POA: Diagnosis not present

## 2021-08-02 DIAGNOSIS — X32XXXD Exposure to sunlight, subsequent encounter: Secondary | ICD-10-CM | POA: Diagnosis not present

## 2021-08-02 DIAGNOSIS — L820 Inflamed seborrheic keratosis: Secondary | ICD-10-CM | POA: Diagnosis not present

## 2021-08-09 NOTE — Telephone Encounter (Signed)
Patient has been approved for funding through the [facility] and will not need assistance through [place] at this time. ?

## 2021-08-19 ENCOUNTER — Other Ambulatory Visit (HOSPITAL_COMMUNITY): Payer: Self-pay

## 2021-08-24 ENCOUNTER — Other Ambulatory Visit (HOSPITAL_COMMUNITY): Payer: Self-pay

## 2021-08-26 DIAGNOSIS — D649 Anemia, unspecified: Secondary | ICD-10-CM | POA: Diagnosis not present

## 2021-08-26 DIAGNOSIS — I10 Essential (primary) hypertension: Secondary | ICD-10-CM | POA: Diagnosis not present

## 2021-08-26 DIAGNOSIS — E119 Type 2 diabetes mellitus without complications: Secondary | ICD-10-CM | POA: Diagnosis not present

## 2021-08-26 DIAGNOSIS — E785 Hyperlipidemia, unspecified: Secondary | ICD-10-CM | POA: Diagnosis not present

## 2021-08-29 DIAGNOSIS — Z8679 Personal history of other diseases of the circulatory system: Secondary | ICD-10-CM | POA: Diagnosis not present

## 2021-08-29 DIAGNOSIS — C9110 Chronic lymphocytic leukemia of B-cell type not having achieved remission: Secondary | ICD-10-CM | POA: Diagnosis not present

## 2021-08-29 DIAGNOSIS — E782 Mixed hyperlipidemia: Secondary | ICD-10-CM | POA: Diagnosis not present

## 2021-08-29 DIAGNOSIS — I480 Paroxysmal atrial fibrillation: Secondary | ICD-10-CM | POA: Diagnosis not present

## 2021-08-29 DIAGNOSIS — N184 Chronic kidney disease, stage 4 (severe): Secondary | ICD-10-CM | POA: Diagnosis not present

## 2021-08-29 DIAGNOSIS — I10 Essential (primary) hypertension: Secondary | ICD-10-CM | POA: Diagnosis not present

## 2021-08-29 DIAGNOSIS — I719 Aortic aneurysm of unspecified site, without rupture: Secondary | ICD-10-CM | POA: Diagnosis not present

## 2021-08-29 DIAGNOSIS — D6949 Other primary thrombocytopenia: Secondary | ICD-10-CM | POA: Diagnosis not present

## 2021-08-29 DIAGNOSIS — K2270 Barrett's esophagus without dysplasia: Secondary | ICD-10-CM | POA: Diagnosis not present

## 2021-08-29 DIAGNOSIS — D649 Anemia, unspecified: Secondary | ICD-10-CM | POA: Diagnosis not present

## 2021-08-30 ENCOUNTER — Encounter: Payer: Self-pay | Admitting: Cardiology

## 2021-08-30 DIAGNOSIS — N12 Tubulo-interstitial nephritis, not specified as acute or chronic: Secondary | ICD-10-CM | POA: Diagnosis not present

## 2021-08-30 DIAGNOSIS — D696 Thrombocytopenia, unspecified: Secondary | ICD-10-CM | POA: Diagnosis not present

## 2021-08-30 DIAGNOSIS — N184 Chronic kidney disease, stage 4 (severe): Secondary | ICD-10-CM | POA: Diagnosis not present

## 2021-08-30 DIAGNOSIS — N200 Calculus of kidney: Secondary | ICD-10-CM | POA: Diagnosis not present

## 2021-08-30 DIAGNOSIS — I129 Hypertensive chronic kidney disease with stage 1 through stage 4 chronic kidney disease, or unspecified chronic kidney disease: Secondary | ICD-10-CM | POA: Diagnosis not present

## 2021-08-30 DIAGNOSIS — I770 Arteriovenous fistula, acquired: Secondary | ICD-10-CM | POA: Diagnosis not present

## 2021-08-30 DIAGNOSIS — C9110 Chronic lymphocytic leukemia of B-cell type not having achieved remission: Secondary | ICD-10-CM | POA: Diagnosis not present

## 2021-08-30 DIAGNOSIS — N179 Acute kidney failure, unspecified: Secondary | ICD-10-CM | POA: Diagnosis not present

## 2021-08-30 DIAGNOSIS — Z8616 Personal history of COVID-19: Secondary | ICD-10-CM | POA: Diagnosis not present

## 2021-09-12 ENCOUNTER — Inpatient Hospital Stay (HOSPITAL_COMMUNITY): Payer: Medicare HMO | Attending: Hematology

## 2021-09-12 DIAGNOSIS — C9110 Chronic lymphocytic leukemia of B-cell type not having achieved remission: Secondary | ICD-10-CM | POA: Diagnosis not present

## 2021-09-12 DIAGNOSIS — N184 Chronic kidney disease, stage 4 (severe): Secondary | ICD-10-CM | POA: Diagnosis not present

## 2021-09-12 DIAGNOSIS — D509 Iron deficiency anemia, unspecified: Secondary | ICD-10-CM

## 2021-09-12 DIAGNOSIS — I129 Hypertensive chronic kidney disease with stage 1 through stage 4 chronic kidney disease, or unspecified chronic kidney disease: Secondary | ICD-10-CM | POA: Diagnosis not present

## 2021-09-12 DIAGNOSIS — D649 Anemia, unspecified: Secondary | ICD-10-CM | POA: Diagnosis not present

## 2021-09-12 DIAGNOSIS — Z87891 Personal history of nicotine dependence: Secondary | ICD-10-CM | POA: Insufficient documentation

## 2021-09-12 LAB — CBC WITH DIFFERENTIAL/PLATELET
Abs Immature Granulocytes: 0.01 10*3/uL (ref 0.00–0.07)
Basophils Absolute: 0 10*3/uL (ref 0.0–0.1)
Basophils Relative: 0 %
Eosinophils Absolute: 0 10*3/uL (ref 0.0–0.5)
Eosinophils Relative: 0 %
HCT: 35.2 % — ABNORMAL LOW (ref 39.0–52.0)
Hemoglobin: 11.7 g/dL — ABNORMAL LOW (ref 13.0–17.0)
Immature Granulocytes: 0 %
Lymphocytes Relative: 17 %
Lymphs Abs: 0.5 10*3/uL — ABNORMAL LOW (ref 0.7–4.0)
MCH: 33.1 pg (ref 26.0–34.0)
MCHC: 33.2 g/dL (ref 30.0–36.0)
MCV: 99.7 fL (ref 80.0–100.0)
Monocytes Absolute: 0.5 10*3/uL (ref 0.1–1.0)
Monocytes Relative: 17 %
Neutro Abs: 2 10*3/uL (ref 1.7–7.7)
Neutrophils Relative %: 66 %
Platelets: 85 10*3/uL — ABNORMAL LOW (ref 150–400)
RBC: 3.53 MIL/uL — ABNORMAL LOW (ref 4.22–5.81)
RDW: 13.6 % (ref 11.5–15.5)
WBC: 3 10*3/uL — ABNORMAL LOW (ref 4.0–10.5)
nRBC: 0 % (ref 0.0–0.2)

## 2021-09-12 LAB — COMPREHENSIVE METABOLIC PANEL
ALT: 15 U/L (ref 0–44)
AST: 19 U/L (ref 15–41)
Albumin: 3.9 g/dL (ref 3.5–5.0)
Alkaline Phosphatase: 54 U/L (ref 38–126)
Anion gap: 5 (ref 5–15)
BUN: 44 mg/dL — ABNORMAL HIGH (ref 8–23)
CO2: 24 mmol/L (ref 22–32)
Calcium: 8.7 mg/dL — ABNORMAL LOW (ref 8.9–10.3)
Chloride: 110 mmol/L (ref 98–111)
Creatinine, Ser: 3.36 mg/dL — ABNORMAL HIGH (ref 0.61–1.24)
GFR, Estimated: 18 mL/min — ABNORMAL LOW (ref >60)
Glucose, Bld: 88 mg/dL (ref 70–99)
Potassium: 4.3 mmol/L (ref 3.5–5.1)
Sodium: 139 mmol/L (ref 135–145)
Total Bilirubin: 0.9 mg/dL (ref 0.3–1.2)
Total Protein: 6.2 g/dL — ABNORMAL LOW (ref 6.5–8.1)

## 2021-09-12 LAB — URIC ACID: Uric Acid, Serum: 6.4 mg/dL (ref 3.7–8.6)

## 2021-09-12 LAB — LACTATE DEHYDROGENASE: LDH: 132 U/L (ref 98–192)

## 2021-09-19 ENCOUNTER — Encounter: Payer: Self-pay | Admitting: Internal Medicine

## 2021-09-19 ENCOUNTER — Inpatient Hospital Stay (HOSPITAL_COMMUNITY): Payer: Medicare HMO | Admitting: Hematology

## 2021-09-19 VITALS — BP 132/68 | HR 72 | Temp 97.0°F | Resp 18 | Ht 74.0 in | Wt 222.7 lb

## 2021-09-19 DIAGNOSIS — N184 Chronic kidney disease, stage 4 (severe): Secondary | ICD-10-CM | POA: Diagnosis not present

## 2021-09-19 DIAGNOSIS — D649 Anemia, unspecified: Secondary | ICD-10-CM | POA: Diagnosis not present

## 2021-09-19 DIAGNOSIS — D509 Iron deficiency anemia, unspecified: Secondary | ICD-10-CM | POA: Diagnosis not present

## 2021-09-19 DIAGNOSIS — C9110 Chronic lymphocytic leukemia of B-cell type not having achieved remission: Secondary | ICD-10-CM | POA: Diagnosis not present

## 2021-09-19 DIAGNOSIS — I129 Hypertensive chronic kidney disease with stage 1 through stage 4 chronic kidney disease, or unspecified chronic kidney disease: Secondary | ICD-10-CM | POA: Diagnosis not present

## 2021-09-19 DIAGNOSIS — Z87891 Personal history of nicotine dependence: Secondary | ICD-10-CM | POA: Diagnosis not present

## 2021-09-19 NOTE — Patient Instructions (Addendum)
[place] at [facility] ?Discharge Instructions ? ?You were seen and examined today by Dr. Delton Coombes. ? ?Dr. Delton Coombes discussed your most recent lab work which is stable. Continue Venetoclax. ? ?Dr. Delton Coombes has recommended a repeat CT scan and labs prior to your next visit. ? ?Follow-up as scheduled. ? ? ? ?Thank you for choosing [place] at [facility] to provide your oncology and hematology care.  To afford each patient quality time with our provider, please arrive at least 15 minutes before your scheduled appointment time.  ? ?If you have a lab appointment with the [facility] please come in thru the Main Entrance and check in at the main information desk. ? ?You need to re-schedule your appointment should you arrive 10 or more minutes late.  We strive to give you quality time with our providers, and arriving late affects you and other patients whose appointments are after yours.  Also, if you no show three or more times for appointments you may be dismissed from the clinic at the providers discretion.     ?Again, thank you for choosing [facility].  Our hope is that these requests will decrease the amount of time that you wait before being seen by our physicians.       ?_____________________________________________________________ ? ?Should you have questions after your visit to [facility], please contact our office at [phone] and follow the prompts.  Our office hours are 8:00 a.m. and 4:30 p.m. Monday - Friday.  Please note that voicemails left after 4:00 p.m. may not be returned until the following business day.  We are closed weekends and major holidays.  You do have access to a nurse 24-7, just call the main number to the clinic [phone] and do not press any options, hold on the line and a nurse will answer the phone.   ? ?For prescription refill requests, have your pharmacy contact our office and allow 72 hours.    ? ?Due to Covid, you will need to wear a mask upon entering the hospital. If you do not have a mask, a mask will be given to you at the Main Entrance upon arrival. For doctor visits, patients may have 1 support person age 49 or older with them. For treatment visits, patients can not have anyone with them due to social distancing guidelines and our immunocompromised population.  ? ? ? ?

## 2021-09-19 NOTE — Progress Notes (Signed)
Patient is taking Venetoclax as prescribed.  He has not missed any doses and reports no side effects at this time.   

## 2021-09-19 NOTE — Progress Notes (Signed)
? ?[facility] ?[address] ?[place], [place] [address] ? ? ?CLINIC:  ?Medical Oncology/Hematology ? ?PCP:  ?[name], MD ?[address] [name] [address]  ?[phone] ? ?REASON FOR VISIT:  ?Follow-up for CLL ? ?PRIOR THERAPY:  ?1. Imbruvica 420 mg from 08/19/2016 through 09/2016. ?2. Chlorambucil 30 mg from 10/2017 through 11/07/2018. ? ?CURRENT THERAPY: Venetoclax 200 mg daily ? ?INTERVAL HISTORY:  ?[name], a 81 y.o. male, returns for routine follow-up for his CLL. Andrew [name] was last seen on 06/14/2021. ? ?Today he reports feeling good. He denies n/v/d, recent infections, skin rash and joint pains. His appetite is good.  ? ?REVIEW OF SYSTEMS:  ?Review of Systems  ?Constitutional:  Negative for appetite change and fatigue.  ?Respiratory:  Positive for shortness of breath.   ?Gastrointestinal:  Negative for diarrhea, nausea and vomiting.  ?Genitourinary:  Positive for nocturia.   ?Musculoskeletal:  Negative for arthralgias.  ?Skin:  Negative for rash.  ?Neurological:  Positive for numbness.  ?All other systems reviewed and are negative. ? ?PAST MEDICAL/SURGICAL HISTORY:  ?Past Medical History:  ?Diagnosis Date  ? Arthritis   ? Ascending aortic aneurysm ([facility])   ? CKD (chronic kidney disease), stage IV ([facility])   ? followed [name]  ? CLL (chronic lymphocytic leukemia) ([facility])   ? Complication of anesthesia   ? "wild when I woke up "  ? History of kidney stones   ? passed   ? Hypertension   ? NICM (nonischemic cardiomyopathy) ([facility])   ? a. cath 2007, EF 35%, minimal CAD. b. EF normal 2018.  ? PAF (paroxysmal atrial fibrillation) ([facility])   ? Pinched nerve in neck   ? Pneumonia   ? SDH (subdural hematoma) ([facility])   ? Thrombocytopenia ([facility])   ? ?Past Surgical History:  ?Procedure Laterality Date  ? AV FISTULA PLACEMENT Left 04/30/2020  ? Procedure: LEFT ARM BRACHIOCEPHALIC ARTERIOVENOUS (AV) FISTULA CREATION;  Surgeon: Serafina Mitchell, MD;  Location: [facility];  Service: Vascular;   Laterality: Left;  ? BIOPSY  08/05/2018  ? Procedure: BIOPSY;  Surgeon: Danie Binder, MD;  [facility];  Service: Endoscopy;;  gastric ?  ? Bone marrow biospy Bilateral   ? CATARACT EXTRACTION, BILATERAL    ? COLONOSCOPY  07/10/2011  ? Procedure: COLONOSCOPY;  Surgeon: Dorothyann Peng, MD;  [facility];  Service: Endoscopy;  Laterality: N/A;  10:30 AM  ? CRANIOTOMY N/A 10/14/2016  ? Procedure: CRANIOTOMY HEMATOMA EVACUATION SUBDURAL;  Surgeon: Jovita Gamma, MD;  Location: [facility];  Service: Neurosurgery;  Laterality: N/A;  CRANIOTOMY HEMATOMA EVACUATION SUBDURAL  ? ESOPHAGOGASTRODUODENOSCOPY N/A 08/05/2018  ? Procedure: ESOPHAGOGASTRODUODENOSCOPY (EGD);  Surgeon: Danie Binder, MD;  [facility];  Service: Endoscopy;  Laterality: N/A;  3:00pm  ? THYROIDECTOMY, PARTIAL    ? TONSILLECTOMY    ? ? ?SOCIAL HISTORY:  ?Social History  ? ?Socioeconomic History  ? Marital status: Married  ?  Spouse name: Not on file  ? Number of children: Not on file  ? Years of education: Not on file  ? Highest education level: Not on file  ?Occupational History  ? Not on file  ?Tobacco Use  ? Smoking status: Former  ?  Packs/day: 1.00  ?  Years: 15.00  ?  Pack years: 15.00  ?  Types: Cigarettes  ?  Quit date: 01/26/1971  ?  Years since quitting: 50.6  ? Smokeless tobacco: Never  ?  Vaping Use  ? Vaping Use: Never used  ?Substance and Sexual Activity  ? Alcohol use: No  ? Drug use: No  ? Sexual activity: Not on file  ?Other Topics Concern  ? Not on file  ?Social History Narrative  ? Not on file  ? ?Social Determinants of Health  ? ?Financial Resource Strain: Not on file  ?Food Insecurity: Not on file  ?Transportation Needs: Not on file  ?Physical Activity: Not on file  ?Stress: Not on file  ?Social Connections: Not on file  ?Intimate Partner Violence: Not on file  ? ? ?FAMILY HISTORY:  ?Family History  ?Problem Relation Age of Onset  ? CAD Father 78  ? Cancer Mother   ? Alzheimer's disease Mother   ? Colon  cancer Neg Hx   ? ? ?CURRENT MEDICATIONS:  ?Current Outpatient Medications  ?Medication Sig Dispense Refill  ? amLODipine (NORVASC) 5 MG tablet Take 5 mg by mouth daily.    ? carvedilol (COREG) 6.25 MG tablet Take 1 tablet (6.25 mg total) by mouth 2 (two) times daily. 180 tablet 3  ? Cholecalciferol (VITAMIN D3) 25 MCG (1000 UT) CAPS Take 1 capsule by mouth daily.    ? Cyanocobalamin (VITAMIN B12) 1000 MCG TBCR Take 1,000 mcg by mouth daily.     ? hydrochlorothiazide (HYDRODIURIL) 12.5 MG tablet Take 12.5 mg by mouth daily.    ? Multiple Vitamin (MULITIVITAMIN WITH MINERALS) TABS Take 1 tablet by mouth daily.    ? omeprazole (PRILOSEC) 20 MG capsule 1 PO 30 MINS PRIOR TO BREAKFAST. (Patient taking differently: Take 20 mg by mouth daily.) 90 capsule 3  ? sodium bicarbonate 325 MG tablet Take 325 mg by mouth 3 (three) times daily.    ? tamsulosin (FLOMAX) 0.4 MG CAPS capsule Take 0.4 mg by mouth daily.     ? venetoclax (VENCLEXTA) 100 MG tablet Take 3 tablets (300 mg total) by mouth daily. 90 tablet 3  ? ?No current facility-administered medications for this visit.  ? ? ?ALLERGIES:  ?No Known Allergies ? ?PHYSICAL EXAM:  ?Performance status (ECOG): 1 - Symptomatic but completely ambulatory ? ?Vitals:  ? 09/19/21 1124  ?BP: 132/68  ?Pulse: 72  ?Resp: 18  ?Temp: (!) 97 ?F (36.1 ?C)  ?SpO2: 97%  ? ?Wt Readings from Last 3 Encounters:  ?09/19/21 222 lb 11.2 oz (101 kg)  ?06/14/21 229 lb 9.6 oz (104.1 kg)  ?03/03/21 238 lb 8.6 oz (108.2 kg)  ? ?Physical Exam ?Vitals reviewed.  ?Constitutional:   ?   Appearance: Normal appearance.  ?Cardiovascular:  ?   Rate and Rhythm: Normal rate and regular rhythm.  ?   Pulses: Normal pulses.  ?   Heart sounds: Normal heart sounds.  ?Pulmonary:  ?   Effort: Pulmonary effort is normal.  ?   Breath sounds: Normal breath sounds.  ?Abdominal:  ?   Palpations: Abdomen is soft. There is no hepatomegaly, splenomegaly or mass.  ?   Tenderness: There is no abdominal tenderness.  ?Musculoskeletal:   ?   Right lower leg: No edema.  ?   Left lower leg: No edema.  ?Lymphadenopathy:  ?   Cervical: No cervical adenopathy.  ?   Right cervical: No superficial cervical adenopathy. ?   Left cervical: No superficial cervical adenopathy.  ?   Upper Body:  ?   Right upper body: No supraclavicular or axillary adenopathy.  ?   Left upper body: No supraclavicular or axillary adenopathy.  ?   Lower Body: No  right inguinal adenopathy. No left inguinal adenopathy.  ?Neurological:  ?   General: No focal deficit present.  ?   Mental Status: He is alert and oriented to person, place, and time.  ?Psychiatric:     ?   Mood and Affect: Mood normal.     ?   Behavior: Behavior normal.  ? ? ?LABORATORY DATA:  ?I have reviewed the labs as listed.  ? ?  Latest Ref Rng & Units 09/12/2021  ? 11:20 AM 06/06/2021  ?  8:03 AM 02/24/2021  ?  8:06 AM  ?CBC  ?WBC 4.0 - 10.5 K/uL 3.0   2.8   2.5    ?Hemoglobin 13.0 - 17.0 g/dL 11.7   12.4   11.7    ?Hematocrit 39.0 - 52.0 % 35.2   35.9   33.3    ?Platelets 150 - 400 K/uL 85   90   78    ? ? ?  Latest Ref Rng & Units 09/12/2021  ? 11:20 AM 06/06/2021  ?  8:03 AM 02/24/2021  ?  8:06 AM  ?CMP  ?Glucose 70 - 99 mg/dL 88   101   105    ?BUN 8 - 23 mg/dL 44   34   39    ?Creatinine 0.61 - 1.24 mg/dL 3.36   3.59   3.66    ?Sodium 135 - 145 mmol/L 139   136   138    ?Potassium 3.5 - 5.1 mmol/L 4.3   4.1   3.9    ?Chloride 98 - 111 mmol/L 110   105   106    ?CO2 22 - 32 mmol/L '24   23   25    '$ ?Calcium 8.9 - 10.3 mg/dL 8.7   8.5   8.7    ?Total Protein 6.5 - 8.1 g/dL 6.2   6.6   6.1    ?Total Bilirubin 0.3 - 1.2 mg/dL 0.9   1.0   0.6    ?Alkaline Phos 38 - 126 U/L 54   67   59    ?AST 15 - 41 U/L '19   16   17    '$ ?ALT 0 - 44 U/L '15   14   14    '$ ? ?   ?Component Value Date/Time  ? RBC 3.53 (L) 09/12/2021 1120  ? MCV 99.7 09/12/2021 1120  ? MCH 33.1 09/12/2021 1120  ? MCHC 33.2 09/12/2021 1120  ? RDW 13.6 09/12/2021 1120  ? LYMPHSABS 0.5 (L) 09/12/2021 1120  ? MONOABS 0.5 09/12/2021 1120  ? EOSABS 0.0 09/12/2021 1120   ? BASOSABS 0.0 09/12/2021 1120  ? ? ?DIAGNOSTIC IMAGING:  ?I have independently reviewed the scans and discussed with the patient. ?No results found.  ? ?ASSESSMENT:  ?1.  CLL, Rai stage IV: ?-BMBX on 3/15/

## 2021-09-20 ENCOUNTER — Other Ambulatory Visit (HOSPITAL_COMMUNITY): Payer: Self-pay

## 2021-09-20 ENCOUNTER — Other Ambulatory Visit (HOSPITAL_COMMUNITY): Payer: Self-pay | Admitting: Hematology

## 2021-09-20 ENCOUNTER — Other Ambulatory Visit (HOSPITAL_COMMUNITY): Payer: Self-pay | Admitting: *Deleted

## 2021-09-20 DIAGNOSIS — C9110 Chronic lymphocytic leukemia of B-cell type not having achieved remission: Secondary | ICD-10-CM

## 2021-09-20 MED ORDER — VENETOCLAX 100 MG PO TABS
300.0000 mg | ORAL_TABLET | Freq: Every day | ORAL | 3 refills | Status: DC
  Filled 2021-09-22: qty 90, 30d supply, fill #0
  Filled 2021-10-19: qty 90, 30d supply, fill #1
  Filled 2021-11-15 (×2): qty 90, 30d supply, fill #2
  Filled 2021-12-13: qty 90, 30d supply, fill #3

## 2021-09-20 NOTE — Telephone Encounter (Signed)
Script sent in for Andrew [name] per request.  Per last ovn, patient is tolerating well and is to continue therapy at this time. ?

## 2021-09-22 ENCOUNTER — Other Ambulatory Visit (HOSPITAL_COMMUNITY): Payer: Self-pay

## 2021-09-27 ENCOUNTER — Other Ambulatory Visit (HOSPITAL_COMMUNITY): Payer: Self-pay

## 2021-10-04 DIAGNOSIS — L570 Actinic keratosis: Secondary | ICD-10-CM | POA: Diagnosis not present

## 2021-10-04 DIAGNOSIS — X32XXXD Exposure to sunlight, subsequent encounter: Secondary | ICD-10-CM | POA: Diagnosis not present

## 2021-10-06 ENCOUNTER — Ambulatory Visit: Payer: Medicare HMO | Admitting: Internal Medicine

## 2021-10-06 ENCOUNTER — Encounter: Payer: Self-pay | Admitting: Internal Medicine

## 2021-10-06 VITALS — BP 131/69 | HR 73 | Temp 97.1°F | Ht 74.0 in | Wt 226.4 lb

## 2021-10-06 DIAGNOSIS — K219 Gastro-esophageal reflux disease without esophagitis: Secondary | ICD-10-CM

## 2021-10-06 DIAGNOSIS — D125 Benign neoplasm of sigmoid colon: Secondary | ICD-10-CM | POA: Diagnosis not present

## 2021-10-06 DIAGNOSIS — K2270 Barrett's esophagus without dysplasia: Secondary | ICD-10-CM | POA: Diagnosis not present

## 2021-10-06 NOTE — Progress Notes (Signed)
? ? ?Referring Provider: [name], MD ?Primary Care Physician:  [name], MD ?Primary GI:  Dr. Abbey Chatters ? ?Chief Complaint  ?Patient presents with  ? barrett's  ?  Doing fine  ? ? ?HPI:   ?Andrew [name] is a 81 y.o. male who presents to clinic today for follow-up visit he has a history of chronic reflux well controlled on omeprazole 20 mg daily.  Last EGD 2020 did show long segment Barrett's esophagus, biopsies proven.  No dysplasia noted.  Patient denies any breakthrough symptoms.  No dysphagia odynophagia.  No epigastric pain.  No melena hematochezia.  Last colonoscopy 2013 with multiple tubular adenomas removed.  No family history of colorectal malignancy. ? ?Past Medical History:  ?Diagnosis Date  ? Arthritis   ? Ascending aortic aneurysm ([facility])   ? CKD (chronic kidney disease), stage IV ([facility])   ? [facility]  ? CLL (chronic lymphocytic leukemia) ([facility])   ? Complication of anesthesia   ? "wild when I woke up "  ? History of kidney stones   ? passed   ? Hypertension   ? NICM (nonischemic cardiomyopathy) ([facility])   ? a. cath 2007, EF 35%, minimal CAD. b. EF normal 2018.  ? PAF (paroxysmal atrial fibrillation) ([facility])   ? Pinched nerve in neck   ? Pneumonia   ? SDH (subdural hematoma) ([facility])   ? Thrombocytopenia ([facility])   ? ? ?Past Surgical History:  ?Procedure Laterality Date  ? AV FISTULA PLACEMENT Left 04/30/2020  ? Procedure: LEFT ARM BRACHIOCEPHALIC ARTERIOVENOUS (AV) FISTULA CREATION;  Surgeon: Serafina Mitchell, MD;  Location: [facility];  Service: Vascular;  Laterality: Left;  ? BIOPSY  08/05/2018  ? Procedure: BIOPSY;  Surgeon: Danie Binder, MD;  [facility];  Service: Endoscopy;;  gastric ?  ? Bone marrow biospy Bilateral   ? CATARACT EXTRACTION, BILATERAL    ? COLONOSCOPY  07/10/2011  ? Procedure: COLONOSCOPY;  Surgeon: Dorothyann Peng, MD;  [facility];  Service: Endoscopy;  Laterality: N/A;  10:30 AM  ? CRANIOTOMY N/A 10/14/2016  ? Procedure: CRANIOTOMY HEMATOMA EVACUATION  SUBDURAL;  Surgeon: Jovita Gamma, MD;  Location: [facility];  Service: Neurosurgery;  Laterality: N/A;  CRANIOTOMY HEMATOMA EVACUATION SUBDURAL  ? ESOPHAGOGASTRODUODENOSCOPY N/A 08/05/2018  ? Procedure: ESOPHAGOGASTRODUODENOSCOPY (EGD);  Surgeon: Danie Binder, MD;  [facility];  Service: Endoscopy;  Laterality: N/A;  3:00pm  ? THYROIDECTOMY, PARTIAL    ? TONSILLECTOMY    ? ? ?Current Outpatient Medications  ?Medication Sig Dispense Refill  ? amLODipine (NORVASC) 5 MG tablet Take 5 mg by mouth daily.    ? carvedilol (COREG) 6.25 MG tablet Take 1 tablet (6.25 mg total) by mouth 2 (two) times daily. 180 tablet 3  ? Cholecalciferol (VITAMIN D3) 25 MCG (1000 UT) CAPS Take 1 capsule by mouth daily.    ? Cyanocobalamin (VITAMIN B12) 1000 MCG TBCR Take 1,000 mcg by mouth daily.     ? hydrochlorothiazide (HYDRODIURIL) 12.5 MG tablet Take 12.5 mg by mouth daily.    ? Multiple Vitamin (MULITIVITAMIN WITH MINERALS) TABS Take 1 tablet by mouth daily.    ? omeprazole (PRILOSEC) 20 MG capsule 1 PO 30 MINS PRIOR TO BREAKFAST. (Patient taking differently: Take 20 mg by mouth daily.) 90 capsule 3  ? sodium bicarbonate 325 MG tablet Take 325 mg by mouth 3 (three) times daily.    ? tamsulosin (FLOMAX) 0.4 MG CAPS capsule Take 0.4 mg by mouth  daily.     ? venetoclax (VENCLEXTA) 100 MG tablet Take 3 tablets (300 mg total) by mouth daily. 90 tablet 3  ? ?No current facility-administered medications for this visit.  ? ? ?Allergies as of 10/06/2021  ? (No Known Allergies)  ? ? ?Family History  ?Problem Relation Age of Onset  ? CAD Father 37  ? Cancer Mother   ? Alzheimer's disease Mother   ? Colon cancer Neg Hx   ? ? ?Social History  ? ?Socioeconomic History  ? Marital status: Married  ?  Spouse name: Not on file  ? Number of children: Not on file  ? Years of education: Not on file  ? Highest education level: Not on file  ?Occupational History  ? Not on file  ?Tobacco Use  ? Smoking status: Former  ?  Packs/day: 1.00  ?  Years:  15.00  ?  Pack years: 15.00  ?  Types: Cigarettes  ?  Quit date: 01/26/1971  ?  Years since quitting: 50.7  ? Smokeless tobacco: Never  ?Vaping Use  ? Vaping Use: Never used  ?Substance and Sexual Activity  ? Alcohol use: No  ? Drug use: No  ? Sexual activity: Not on file  ?Other Topics Concern  ? Not on file  ?Social History Narrative  ? Not on file  ? ?Social Determinants of Health  ? ?Financial Resource Strain: Not on file  ?Food Insecurity: Not on file  ?Transportation Needs: Not on file  ?Physical Activity: Not on file  ?Stress: Not on file  ?Social Connections: Not on file  ? ? ?Subjective: ?Review of Systems  ?Constitutional:  Negative for chills and fever.  ?HENT:  Negative for congestion and hearing loss.   ?Eyes:  Negative for blurred vision and double vision.  ?Respiratory:  Negative for cough and shortness of breath.   ?Cardiovascular:  Negative for chest pain and palpitations.  ?Gastrointestinal:  Negative for abdominal pain, blood in stool, constipation, diarrhea, heartburn, melena and vomiting.  ?Genitourinary:  Negative for dysuria and urgency.  ?Musculoskeletal:  Negative for joint pain and myalgias.  ?Skin:  Negative for itching and rash.  ?Neurological:  Negative for dizziness and headaches.  ?Psychiatric/Behavioral:  Negative for depression. The patient is not nervous/anxious.   ? ? ?Objective: ?BP 131/69   Pulse 73   Temp (!) 97.1 ?F (36.2 ?C)   Ht '6\' 2"'$  (1.88 m)   Wt 226 lb 6.4 oz (102.7 kg)   BMI 29.07 kg/m?  ?Physical Exam ?Constitutional:   ?   Appearance: Normal appearance.  ?HENT:  ?   Head: Normocephalic and atraumatic.  ?Eyes:  ?   Extraocular Movements: Extraocular movements intact.  ?   Conjunctiva/sclera: Conjunctivae normal.  ?Cardiovascular:  ?   Rate and Rhythm: Normal rate and regular rhythm.  ?Pulmonary:  ?   Effort: Pulmonary effort is normal.  ?   Breath sounds: Normal breath sounds.  ?Abdominal:  ?   General: Bowel sounds are normal.  ?   Palpations: Abdomen is soft.   ?Musculoskeletal:     ?   General: Normal range of motion.  ?   Cervical back: Normal range of motion and neck supple.  ?Skin: ?   General: Skin is warm.  ?Neurological:  ?   General: No focal deficit present.  ?   Mental Status: He is alert and oriented to person, place, and time.  ?Psychiatric:     ?   Mood and Affect: Mood normal.     ?  Behavior: Behavior normal.  ? ? ? ?Assessment: ?*Chronic reflux-well-controlled on omeprazole ?*Barrett's esophagus without dysplasia ?*Adenomatous colon polyp ? ?Plan: ?Patient's reflux well controlled on omeprazole 20 mg daily.  We will continue.  ? ?Discussed Barrett's esophagus in depth with him today including progression to dysplasia and eventual esophageal cancer though this risk is low.  Discussed surveillance EGD.  Would like to hold off for now. ? ?Also discussed colonoscopy given his history of adenomatous colon polyps.  Would like to hold off as well.  Given his age this is reasonable. ? ?I did discuss that given his history of adenomatous colon polyps as well as Barrett's esophagus, we could be potentially missing precancerous or even cancerous conditions and he understands.  Recent CT imaging unremarkable from a GI standpoint. ? ?If patient would like to have these procedures scheduled, all he has to do is call our office and we will schedule them.  ASA 3. ? ?Otherwise follow-up in 1 year or sooner if needed. ? ?10/06/2021 9:51 AM ? ? ?Disclaimer: This note was dictated with voice recognition software. Similar sounding words can inadvertently be transcribed and may not be corrected upon review. ? ?

## 2021-10-06 NOTE — Patient Instructions (Signed)
I am happy to hear that you are doing well.  Continue on omeprazole daily for your chronic reflux and Barrett's esophagus. ? ?If you decide you would like to undergo upper endoscopy and colonoscopy then let us know and we will schedule. ? ?Otherwise follow-up in 1 year or sooner if needed. ? ?It was great seeing you again today. ? ?Dr. Abbey Chatters ? ?At [facility] we value your feedback. You may receive a survey about your visit today. Please share your experience as we strive to create trusting relationships with our patients to provide genuine, compassionate, quality care. ? ?We appreciate your understanding and patience as we review any laboratory studies, imaging, and other diagnostic tests that are ordered as we care for you. Our office policy is 5 business days for review of these results, and any emergent or urgent results are addressed in a timely manner for your best interest. If you do not hear from our office in 1 week, please contact us.  ? ?We also encourage the use of MyChart, which contains your medical information for your review as well. If you are not enrolled in this feature, an access code is on this after visit summary for your convenience. Thank you for allowing [place] to be involved in your care. ? ?It was great to see you today!  I hope you have a great rest of your Spring! ? ? ? ?[place]on Alas. Abbey Chatters, D.O. ?Gastroenterology and Hepatology ?[facility] ? ?

## 2021-10-19 ENCOUNTER — Other Ambulatory Visit (HOSPITAL_COMMUNITY): Payer: Self-pay

## 2021-10-27 ENCOUNTER — Other Ambulatory Visit (HOSPITAL_COMMUNITY): Payer: Self-pay

## 2021-10-31 ENCOUNTER — Ambulatory Visit: Payer: Medicare HMO | Admitting: Medical

## 2021-10-31 ENCOUNTER — Encounter: Payer: Self-pay | Admitting: Medical

## 2021-10-31 VITALS — BP 138/80 | HR 58 | Ht 74.0 in | Wt 227.0 lb

## 2021-10-31 DIAGNOSIS — I4821 Permanent atrial fibrillation: Secondary | ICD-10-CM

## 2021-10-31 DIAGNOSIS — I712 Thoracic aortic aneurysm, without rupture, unspecified: Secondary | ICD-10-CM

## 2021-10-31 DIAGNOSIS — I5022 Chronic systolic (congestive) heart failure: Secondary | ICD-10-CM

## 2021-10-31 DIAGNOSIS — I10 Essential (primary) hypertension: Secondary | ICD-10-CM | POA: Diagnosis not present

## 2021-10-31 DIAGNOSIS — N185 Chronic kidney disease, stage 5: Secondary | ICD-10-CM

## 2021-10-31 NOTE — Progress Notes (Signed)
Cardiology Office Note:    Date:  10/31/2021   ID:  Andrew [name], DOB 1941/04/01, MRN [id number]  PCP:  [name], MD  [facility] Cardiologist:  Carlyle Dolly, MD  [facility] Electrophysiologist:  None   Referring MD: [name], MD   Chief Complaint: 1 year follow-up  History of Present Illness:    Andrew [name] is a 81 y.o. 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   He reports being told several year ago that his LVEF was 35%, this was around the time of his first diagnosis of afib. A cath note scanned in from 2007 corroborates this, cath showed minimal CAD and CM was felt nonischemic. He had a cardioversion and was on Coumadin for a period of time but this was eventually discontinued for unclear reasons. Last echo 09/2016 showed mild LVH, EF 55-60%, trivial AI, mildly dilated LA. In 09/2016 he was found to have a subdural hematoma requiring surgery. He was on ASA '325mg'$  at the time. This occurred 4-5 months following a fall so it was a "bit of a puzzle" with the timing per the family. Dr. Nelly Laurence notes have indicated him not to be a candidate for anticoagulation due to prior subdural and labile platelets.  Last seen 06/24/20 and was overall stable from a cardiac perspective.   Today, the patient reports no changes since the last visit. Reports he has been stable from a cardiac standpoint. He has chronic SOB on activity, this is unchanged. He walks 1-2 miles 5 times a week. No chest pain. No LLE, orthoonea, pnd, lightheadedness, palpitations.  EKG shows rate controlled Afib. He follows closely with heme/onc and nephrology.   Past Medical History:  Diagnosis Date   Arthritis    Ascending aortic aneurysm (HCC)    CKD (chronic kidney disease), stage IV ([place])    followed by [facility]   CLL  (chronic lymphocytic leukemia) ([place])    Complication of anesthesia    "wild when I woke up "   History of kidney stones    passed    Hypertension    NICM (nonischemic cardiomyopathy) ([place])    a. cath 2007, EF 35%, minimal CAD. b. EF normal 2018.   PAF (paroxysmal atrial fibrillation) (HCC)    Pinched nerve in neck    Pneumonia    SDH (subdural hematoma) (HCC)    Thrombocytopenia (HCC)     Past Surgical History:  Procedure Laterality Date   AV FISTULA PLACEMENT Left 04/30/2020   Procedure: LEFT ARM BRACHIOCEPHALIC ARTERIOVENOUS (AV) FISTULA CREATION;  Surgeon: Serafina Mitchell, MD;  Location: [facility];  Service: Vascular;  Laterality: Left;   BIOPSY  08/05/2018   Procedure: BIOPSY;  Surgeon: Danie Binder, MD;  [facility];  Service: Endoscopy;;  gastric    Bone marrow biospy Bilateral    CATARACT EXTRACTION, BILATERAL     COLONOSCOPY  07/10/2011   Procedure: COLONOSCOPY;  Surgeon: Dorothyann Peng, MD;  [facility];  Service: Endoscopy;  Laterality: N/A;  10:30 AM   CRANIOTOMY N/A 10/14/2016   Procedure: CRANIOTOMY HEMATOMA EVACUATION SUBDURAL;  Surgeon: Jovita Gamma, MD;  Location: [facility];  Service: Neurosurgery;  Laterality: N/A;  CRANIOTOMY HEMATOMA EVACUATION SUBDURAL   ESOPHAGOGASTRODUODENOSCOPY N/A 08/05/2018   Procedure: ESOPHAGOGASTRODUODENOSCOPY (EGD);  Surgeon: Oneida Alar,  Marga Melnick, MD;  [facility];  Service: Endoscopy;  Laterality: N/A;  3:00pm   THYROIDECTOMY, PARTIAL     TONSILLECTOMY      Current Medications: Current Meds  Medication Sig   amLODipine (NORVASC) 5 MG tablet Take 5 mg by mouth daily.   carvedilol (COREG) 6.25 MG tablet Take 1 tablet (6.25 mg total) by mouth 2 (two) times daily.   Cholecalciferol (VITAMIN D3) 25 MCG (1000 UT) CAPS Take 1 capsule by mouth daily.   Cyanocobalamin (VITAMIN B12) 1000 MCG TBCR Take 1,000 mcg by mouth daily.    hydrochlorothiazide (HYDRODIURIL) 12.5 MG tablet Take 12.5 mg by mouth daily.    Multiple Vitamin (MULITIVITAMIN WITH MINERALS) TABS Take 1 tablet by mouth daily.   omeprazole (PRILOSEC) 20 MG capsule 1 PO 30 MINS PRIOR TO BREAKFAST. (Patient taking differently: Take 20 mg by mouth daily.)   sodium bicarbonate 325 MG tablet Take 325 mg by mouth 3 (three) times daily.   tamsulosin (FLOMAX) 0.4 MG CAPS capsule Take 0.4 mg by mouth daily.    venetoclax (VENCLEXTA) 100 MG tablet Take 3 tablets (300 mg total) by mouth daily.     Allergies:   Patient has no known allergies.   Social History   Socioeconomic History   Marital status: Married    Spouse name: Not on file   Number of children: Not on file   Years of education: Not on file   Highest education level: Not on file  Occupational History   Not on file  Tobacco Use   Smoking status: Former    Packs/day: 1.00    Years: 15.00    Pack years: 15.00    Types: Cigarettes    Quit date: 01/26/1971    Years since quitting: 50.7   Smokeless tobacco: Never  Vaping Use   Vaping Use: Never used  Substance and Sexual Activity   Alcohol use: No   Drug use: No   Sexual activity: Not on file  Other Topics Concern   Not on file  Social History Narrative   Not on file   Social Determinants of Health   Financial Resource Strain: Not on file  Food Insecurity: Not on file  Transportation Needs: Not on file  Physical Activity: Not on file  Stress: Not on file  Social Connections: Not on file     Family History: The patient's family history includes Alzheimer's disease in his mother; CAD (age of onset: 54) in his father; Cancer in his mother. There is no history of Colon cancer.  ROS:   Please see the history of present illness.     All other systems reviewed and are negative.  EKGs/Labs/Other Studies Reviewed:    The following studies were reviewed today:  Echo 09/2016 Study Conclusions   - Left ventricle: The cavity size was normal. There was mild    concentric hypertrophy. Systolic function was normal. The     estimated ejection fraction was in the range of 55% to 60%. Wall    motion was normal; there were no regional wall motion    abnormalities.  - Aortic valve: There was trivial regurgitation.  - Left atrium: The atrium was mildly dilated.   EKG:  EKG is  ordered today.  The ekg ordered today demonstrates Afib 67bpm, LPFB, nonspecific T wave changes  Recent Labs: 06/06/2021: Magnesium 2.4 09/12/2021: ALT 15; BUN 44; Creatinine, Ser 3.36; Hemoglobin 11.7; Platelets 85; Potassium 4.3; Sodium 139  Recent Lipid Panel No results found  for: CHOL, TRIG, HDL, CHOLHDL, VLDL, LDLCALC, LDLDIRECT   Physical Exam:    VS:  BP 138/80   Pulse (!) 58   Ht '6\' 2"'$  (1.88 m)   Wt 227 lb (103 kg)   SpO2 97%   BMI 29.15 kg/m     Wt Readings from Last 3 Encounters:  10/31/21 227 lb (103 kg)  10/06/21 226 lb 6.4 oz (102.7 kg)  09/19/21 222 lb 11.2 oz (101 kg)     GEN:  Well nourished, well developed in no acute distress HEENT: Normal NECK: No JVD; No carotid bruits LYMPHATICS: No lymphadenopathy CARDIAC: Irreg Irreg, no murmurs, rubs, gallops RESPIRATORY:  Clear to auscultation without rales, wheezing or rhonchi  ABDOMEN: Soft, non-tender, non-distended MUSCULOSKELETAL:  No edema; No deformity  SKIN: Warm and dry NEUROLOGIC:  Alert and oriented x 3 PSYCHIATRIC:  Normal affect   ASSESSMENT:    1. Permanent atrial fibrillation ([place])   2. Essential hypertension   3. Chronic systolic heart failure ([place])   4. CKD (chronic kidney disease) stage 5, GFR less than 15 ml/min (HCC)   5. Thoracic aortic aneurysm without rupture, unspecified part ([place])    PLAN:    In order of problems listed above:  Permanent Afib Appears to be in rate controlled Afib today. Looking back at prior EKGs from previous years show rate controlled Afib. He has CHADSVASC of at least 4 (HTN, agex2, CHF, HTN). He is not on anticoagulation due to h/o thrombocytopenia and SDH. Continue rate control with Coreg.   HTN BP today  is good. Continue amlodipine '5mg'$  daily, coreg 6.'25mg'$  BID, HCTZ 12.'5mg'$  daily.   Aortic aneurysm Most recent CT chest showed no aneurysm or dilation. He undergoes repeat imaging every 6 months to 1 year.   HFimpEF He is euvolemic on exam today. Echo from 2018 showed LVEF 55-60%, trivial AI. Continue Coreg. He is on low dose HCTZ.  CKD stage 4-5 Most recent Scr 3.36, BUN 44, GFR 18. Patient reports kidney function has been slowly improving. He follows closely with nephrology, [facility].   Disposition: Follow up in 1 year(s) with MD/APP     Signed, Kirat Mezquita Ninfa Meeker, PA-C  10/31/2021 1:50 PM    [facility]

## 2021-10-31 NOTE — Addendum Note (Signed)
Addended by: Levonne Hubert on: 10/31/2021 04:13 PM   Modules accepted: Orders

## 2021-10-31 NOTE — Patient Instructions (Signed)
Medication Instructions:  Your physician recommends that you continue on your current medications as directed. Please refer to the Current Medication list given to you today.  *If you need a refill on your cardiac medications before your next appointment, please call your pharmacy*   Lab Work: NONE   If you have labs (blood work) drawn today and your tests are completely normal, you will receive your results only by: [place] (if you have MyChart) OR A paper copy in the mail If you have any lab test that is abnormal or we need to change your treatment, we will call you to review the results.   Testing/Procedures: NONE    Follow-Up: At [facility], you and your health needs are our priority.  As part of our continuing mission to provide you with exceptional heart care, we have created designated Provider Care Teams.  These Care Teams include your primary Cardiologist (physician) and Advanced Practice Providers (APPs -  Physician Assistants and Nurse Practitioners) who all work together to provide you with the care you need, when you need it.  We recommend signing up for the patient portal called "MyChart".  Sign up information is provided on this After Visit Summary.  MyChart is used to connect with patients for Virtual Visits (Telemedicine).  Patients are able to view lab/test results, encounter notes, upcoming appointments, etc.  Non-urgent messages can be sent to your provider as well.   To learn more about what you can do with MyChart, go to [email or web address].    Your next appointment:   1 year(s)  The format for your next appointment:   In Person  Provider:   Carlyle Dolly, MD    Other Instructions Thank you for choosing [facility]!    Important Information About [place]

## 2021-11-05 ENCOUNTER — Other Ambulatory Visit: Payer: Self-pay | Admitting: Nurse Practitioner

## 2021-11-09 NOTE — Telephone Encounter (Signed)
Oral Oncology Patient Advocate Encounter  Received notification from [facility] Patient Assistance program that patient has been successfully enrolled into their program to receive Venclexta from the manufacturer at $0 out of pocket until therapy discontinued, no longer meets eligibility requirements, or health insurance or financial status changes.  Specialty Pharmacy that will dispense medication is [facility]  [phone]).  Obtained the patient a grant through [facility]. Once exhausted patient may be able to continue with [employer] PAP.   Oral Oncology Clinic will continue to follow.  [place] Patient [place] Phone [phone] Fax [phone]

## 2021-11-11 ENCOUNTER — Other Ambulatory Visit (HOSPITAL_COMMUNITY): Payer: Self-pay

## 2021-11-15 ENCOUNTER — Other Ambulatory Visit (HOSPITAL_COMMUNITY): Payer: Self-pay

## 2021-11-22 ENCOUNTER — Other Ambulatory Visit (HOSPITAL_COMMUNITY): Payer: Self-pay

## 2021-12-13 ENCOUNTER — Other Ambulatory Visit (HOSPITAL_COMMUNITY): Payer: Self-pay

## 2021-12-16 DIAGNOSIS — I770 Arteriovenous fistula, acquired: Secondary | ICD-10-CM | POA: Diagnosis not present

## 2021-12-16 DIAGNOSIS — N200 Calculus of kidney: Secondary | ICD-10-CM | POA: Diagnosis not present

## 2021-12-16 DIAGNOSIS — I129 Hypertensive chronic kidney disease with stage 1 through stage 4 chronic kidney disease, or unspecified chronic kidney disease: Secondary | ICD-10-CM | POA: Diagnosis not present

## 2021-12-16 DIAGNOSIS — C9110 Chronic lymphocytic leukemia of B-cell type not having achieved remission: Secondary | ICD-10-CM | POA: Diagnosis not present

## 2021-12-16 DIAGNOSIS — N179 Acute kidney failure, unspecified: Secondary | ICD-10-CM | POA: Diagnosis not present

## 2021-12-16 DIAGNOSIS — Z8616 Personal history of COVID-19: Secondary | ICD-10-CM | POA: Diagnosis not present

## 2021-12-16 DIAGNOSIS — N12 Tubulo-interstitial nephritis, not specified as acute or chronic: Secondary | ICD-10-CM | POA: Diagnosis not present

## 2021-12-16 DIAGNOSIS — D696 Thrombocytopenia, unspecified: Secondary | ICD-10-CM | POA: Diagnosis not present

## 2021-12-16 DIAGNOSIS — N184 Chronic kidney disease, stage 4 (severe): Secondary | ICD-10-CM | POA: Diagnosis not present

## 2021-12-20 ENCOUNTER — Ambulatory Visit (HOSPITAL_COMMUNITY)
Admission: RE | Admit: 2021-12-20 | Discharge: 2021-12-20 | Disposition: A | Discharge status: Home / Self Care | Payer: Medicare HMO | Source: Ambulatory Visit | Attending: Hematology | Admitting: Hematology

## 2021-12-20 ENCOUNTER — Other Ambulatory Visit (HOSPITAL_COMMUNITY): Payer: Self-pay | Admitting: Hematology

## 2021-12-20 ENCOUNTER — Other Ambulatory Visit (HOSPITAL_COMMUNITY): Payer: Medicare HMO

## 2021-12-20 ENCOUNTER — Inpatient Hospital Stay (HOSPITAL_COMMUNITY): Payer: Medicare HMO | Attending: Hematology

## 2021-12-20 DIAGNOSIS — N200 Calculus of kidney: Secondary | ICD-10-CM | POA: Insufficient documentation

## 2021-12-20 DIAGNOSIS — K5730 Diverticulosis of large intestine without perforation or abscess without bleeding: Secondary | ICD-10-CM | POA: Insufficient documentation

## 2021-12-20 DIAGNOSIS — I700 Atherosclerosis of aorta: Secondary | ICD-10-CM | POA: Insufficient documentation

## 2021-12-20 DIAGNOSIS — D509 Iron deficiency anemia, unspecified: Secondary | ICD-10-CM

## 2021-12-20 DIAGNOSIS — C9110 Chronic lymphocytic leukemia of B-cell type not having achieved remission: Secondary | ICD-10-CM | POA: Insufficient documentation

## 2021-12-20 DIAGNOSIS — J439 Emphysema, unspecified: Secondary | ICD-10-CM | POA: Diagnosis not present

## 2021-12-20 LAB — CBC WITH DIFFERENTIAL/PLATELET
Abs Immature Granulocytes: 0.01 10*3/uL (ref 0.00–0.07)
Basophils Absolute: 0 10*3/uL (ref 0.0–0.1)
Basophils Relative: 0 %
Eosinophils Absolute: 0 10*3/uL (ref 0.0–0.5)
Eosinophils Relative: 1 %
HCT: 37.1 % — ABNORMAL LOW (ref 39.0–52.0)
Hemoglobin: 12.8 g/dL — ABNORMAL LOW (ref 13.0–17.0)
Immature Granulocytes: 0 %
Lymphocytes Relative: 12 %
Lymphs Abs: 0.4 10*3/uL — ABNORMAL LOW (ref 0.7–4.0)
MCH: 34.7 pg — ABNORMAL HIGH (ref 26.0–34.0)
MCHC: 34.5 g/dL (ref 30.0–36.0)
MCV: 100.5 fL — ABNORMAL HIGH (ref 80.0–100.0)
Monocytes Absolute: 0.4 10*3/uL (ref 0.1–1.0)
Monocytes Relative: 14 %
Neutro Abs: 2.3 10*3/uL (ref 1.7–7.7)
Neutrophils Relative %: 73 %
Platelets: 85 10*3/uL — ABNORMAL LOW (ref 150–400)
RBC: 3.69 MIL/uL — ABNORMAL LOW (ref 4.22–5.81)
RDW: 12.7 % (ref 11.5–15.5)
WBC: 3.1 10*3/uL — ABNORMAL LOW (ref 4.0–10.5)
nRBC: 0 % (ref 0.0–0.2)

## 2021-12-20 LAB — COMPREHENSIVE METABOLIC PANEL
ALT: 14 U/L (ref 0–44)
AST: 15 U/L (ref 15–41)
Albumin: 4.1 g/dL (ref 3.5–5.0)
Alkaline Phosphatase: 56 U/L (ref 38–126)
Anion gap: 7 (ref 5–15)
BUN: 43 mg/dL — ABNORMAL HIGH (ref 8–23)
CO2: 25 mmol/L (ref 22–32)
Calcium: 9 mg/dL (ref 8.9–10.3)
Chloride: 109 mmol/L (ref 98–111)
Creatinine, Ser: 3.46 mg/dL — ABNORMAL HIGH (ref 0.61–1.24)
GFR, Estimated: 17 mL/min — ABNORMAL LOW (ref >60)
Glucose, Bld: 105 mg/dL — ABNORMAL HIGH (ref 70–99)
Potassium: 4.4 mmol/L (ref 3.5–5.1)
Sodium: 141 mmol/L (ref 135–145)
Total Bilirubin: 0.9 mg/dL (ref 0.3–1.2)
Total Protein: 6.6 g/dL (ref 6.5–8.1)

## 2021-12-20 LAB — FERRITIN: Ferritin: 102 ng/mL (ref 24–336)

## 2021-12-20 LAB — IRON AND TIBC
Iron: 75 ug/dL (ref 45–182)
Saturation Ratios: 23 % (ref 17.9–39.5)
TIBC: 322 ug/dL (ref 250–450)
UIBC: 247 ug/dL

## 2021-12-20 LAB — LACTATE DEHYDROGENASE: LDH: 134 U/L (ref 98–192)

## 2021-12-21 ENCOUNTER — Other Ambulatory Visit (HOSPITAL_COMMUNITY): Payer: Self-pay

## 2021-12-27 ENCOUNTER — Inpatient Hospital Stay: Payer: Medicare HMO | Attending: Hematology | Admitting: Hematology

## 2021-12-27 ENCOUNTER — Other Ambulatory Visit: Payer: Self-pay | Admitting: *Deleted

## 2021-12-27 VITALS — BP 115/86 | HR 65 | Temp 97.7°F | Resp 18 | Ht 74.0 in | Wt 227.9 lb

## 2021-12-27 DIAGNOSIS — D696 Thrombocytopenia, unspecified: Secondary | ICD-10-CM | POA: Insufficient documentation

## 2021-12-27 DIAGNOSIS — N185 Chronic kidney disease, stage 5: Secondary | ICD-10-CM

## 2021-12-27 DIAGNOSIS — C9110 Chronic lymphocytic leukemia of B-cell type not having achieved remission: Secondary | ICD-10-CM | POA: Diagnosis not present

## 2021-12-27 DIAGNOSIS — Z87891 Personal history of nicotine dependence: Secondary | ICD-10-CM | POA: Diagnosis not present

## 2021-12-27 DIAGNOSIS — N12 Tubulo-interstitial nephritis, not specified as acute or chronic: Secondary | ICD-10-CM | POA: Insufficient documentation

## 2021-12-27 DIAGNOSIS — D509 Iron deficiency anemia, unspecified: Secondary | ICD-10-CM

## 2021-12-27 DIAGNOSIS — N184 Chronic kidney disease, stage 4 (severe): Secondary | ICD-10-CM | POA: Diagnosis not present

## 2021-12-27 DIAGNOSIS — Z809 Family history of malignant neoplasm, unspecified: Secondary | ICD-10-CM | POA: Diagnosis not present

## 2021-12-27 DIAGNOSIS — I129 Hypertensive chronic kidney disease with stage 1 through stage 4 chronic kidney disease, or unspecified chronic kidney disease: Secondary | ICD-10-CM | POA: Insufficient documentation

## 2021-12-27 DIAGNOSIS — D649 Anemia, unspecified: Secondary | ICD-10-CM | POA: Insufficient documentation

## 2021-12-27 NOTE — Progress Notes (Signed)
Patient is taking Venetoclax as prescribed.  He has not missed any doses and reports no side effects at this time.

## 2021-12-27 NOTE — Patient Instructions (Signed)
[place] at [facility] Discharge Instructions   You were seen and examined today by Dr. Delton Coombes.  He reviewed the results of your lab work and CT scan which are normal/stable.   Continue Venetoclax as prescribed.  Return as scheduled in 4 months.    Thank you for choosing [place] at [facility] to provide your oncology and hematology care.  To afford each patient quality time with our provider, please arrive at least 15 minutes before your scheduled appointment time.   If you have a lab appointment with the [facility] please come in thru the Main Entrance and check in at the main information desk.  You need to re-schedule your appointment should you arrive 10 or more minutes late.  We strive to give you quality time with our providers, and arriving late affects you and other patients whose appointments are after yours.  Also, if you no show three or more times for appointments you may be dismissed from the clinic at the providers discretion.     Again, thank you for choosing [facility].  Our hope is that these requests will decrease the amount of time that you wait before being seen by our physicians.       _____________________________________________________________  Should you have questions after your visit to [facility], please contact our office at [phone] and follow the prompts.  Our office hours are 8:00 a.m. and 4:30 p.m. Monday - Friday.  Please note that voicemails left after 4:00 p.m. may not be returned until the following business day.  We are closed weekends and major holidays.  You do have access to a nurse 24-7, just call the main number to the clinic [phone] and do not press any options, hold on the line and a nurse will answer the phone.    For prescription refill requests, have your pharmacy contact our office and allow 72 hours.    Due to Covid, you will need to wear a mask  upon entering the hospital. If you do not have a mask, a mask will be given to you at the Main Entrance upon arrival. For doctor visits, patients may have 1 support person age 72 or older with them. For treatment visits, patients can not have anyone with them due to social distancing guidelines and our immunocompromised population.

## 2021-12-27 NOTE — Progress Notes (Signed)
[name], [place] [address]   CLINIC:  Medical Oncology/Hematology  PCP:  [name], MD [name] [place] [address]  [phone]  REASON FOR VISIT:  Follow-up for CLL  PRIOR THERAPY: none  CURRENT THERAPY: surveillance  INTERVAL HISTORY:  [name], a 81 y.o. male, returns for routine follow-up for his CLL. [name] was last seen on 06/14/2021.  Today he reports feeling well. He is not taking iron tablets.    REVIEW OF SYSTEMS:  Review of Systems  Constitutional:  Positive for fatigue. Negative for appetite change.  Respiratory:  Positive for shortness of breath.   Genitourinary:  Positive for nocturia.   All other systems reviewed and are negative.   PAST MEDICAL/SURGICAL HISTORY:  Past Medical History:  Diagnosis Date   Arthritis    Ascending aortic aneurysm (HCC)    CKD (chronic kidney disease), stage IV ([place])    followed by [facility]   CLL (chronic lymphocytic leukemia) ([place])    Complication of anesthesia    "wild when I woke up "   History of kidney stones    passed    Hypertension    NICM (nonischemic cardiomyopathy) ([place])    a. cath 2007, EF 35%, minimal CAD. b. EF normal 2018.   PAF (paroxysmal atrial fibrillation) (HCC)    Pinched nerve in neck    Pneumonia    SDH (subdural hematoma) (HCC)    Thrombocytopenia (HCC)    Past Surgical History:  Procedure Laterality Date   AV FISTULA PLACEMENT Left 04/30/2020   Procedure: LEFT ARM BRACHIOCEPHALIC ARTERIOVENOUS (AV) FISTULA CREATION;  Surgeon: Serafina Mitchell, MD;  Location: [facility];  Service: Vascular;  Laterality: Left;   BIOPSY  08/05/2018   Procedure: BIOPSY;  Surgeon: Danie Binder, MD;  [facility];  Service: Endoscopy;;  gastric    Bone marrow biospy Bilateral    CATARACT EXTRACTION, BILATERAL     COLONOSCOPY  07/10/2011   Procedure: COLONOSCOPY;  Surgeon: Dorothyann Peng, MD;  [facility];  Service:  Endoscopy;  Laterality: N/A;  10:30 AM   CRANIOTOMY N/A 10/14/2016   Procedure: CRANIOTOMY HEMATOMA EVACUATION SUBDURAL;  Surgeon: Jovita Gamma, MD;  Location: [facility];  Service: Neurosurgery;  Laterality: N/A;  CRANIOTOMY HEMATOMA EVACUATION SUBDURAL   ESOPHAGOGASTRODUODENOSCOPY N/A 08/05/2018   Procedure: ESOPHAGOGASTRODUODENOSCOPY (EGD);  Surgeon: Danie Binder, MD;  [facility];  Service: Endoscopy;  Laterality: N/A;  3:00pm   THYROIDECTOMY, PARTIAL     TONSILLECTOMY      SOCIAL HISTORY:  Social History   Socioeconomic History   Marital status: Married    Spouse name: Not on file   Number of children: Not on file   Years of education: Not on file   Highest education level: Not on file  Occupational History   Not on file  Tobacco Use   Smoking status: Former    Packs/day: 1.00    Years: 15.00    Total pack years: 15.00    Types: Cigarettes    Quit date: 01/26/1971    Years since quitting: 50.9   Smokeless tobacco: Never  Vaping Use   Vaping Use: Never used  Substance and Sexual Activity   Alcohol use: No   Drug use: No   Sexual activity: Not on file  Other Topics Concern   Not on file  Social History Narrative   Not on file  Social Determinants of Health   Financial Resource Strain: Low Risk  (08/30/2020)   Overall Financial Resource Strain (CARDIA)    Difficulty of Paying Living Expenses: Not hard at all  Food Insecurity: No Food Insecurity (08/30/2020)   Hunger Vital Sign    Worried About Running Out of Food in the Last Year: Never true    [place] in the Last Year: Never true  Transportation Needs: No Transportation Needs (08/30/2020)   PRAPARE - Hydrologist (Medical): No    Lack of Transportation (Non-Medical): No  Physical Activity: Inactive (08/30/2020)   Exercise Vital Sign    Days of Exercise per Week: 0 days    Minutes of Exercise per Session: 0 min  Stress: No Stress Concern Present (08/30/2020)   [place]    Feeling of Stress : Not at all  Social Connections: Moderately Integrated (08/30/2020)   Social Connection and Isolation Panel [NHANES]    Frequency of Communication with Friends and Family: More than three times a week    Frequency of Social Gatherings with Friends and Family: More than three times a week    Attends Religious Services: More than 4 times per year    Active Member of [employer] or Organizations: No    Attends Archivist Meetings: Never    Marital Status: Married  Human resources officer Violence: Not At Risk (08/30/2020)   Humiliation, Afraid, Rape, and Kick questionnaire    Fear of Current or Ex-Partner: No    Emotionally Abused: No    Physically Abused: No    Sexually Abused: No    FAMILY HISTORY:  Family History  Problem Relation Age of Onset   CAD Father 81   Cancer Mother    Alzheimer's disease Mother    Colon cancer Neg Hx     CURRENT MEDICATIONS:  Current Outpatient Medications  Medication Sig Dispense Refill   amLODipine (NORVASC) 5 MG tablet Take 5 mg by mouth daily.     carvedilol (COREG) 6.25 MG tablet Take 1 tablet (6.25 mg total) by mouth 2 (two) times daily. 180 tablet 3   Cholecalciferol (VITAMIN D3) 25 MCG (1000 UT) CAPS Take 1 capsule by mouth daily.     Cyanocobalamin (VITAMIN B12) 1000 MCG TBCR Take 1,000 mcg by mouth daily.      hydrochlorothiazide (HYDRODIURIL) 12.5 MG tablet Take 12.5 mg by mouth daily.     Multiple Vitamin (MULITIVITAMIN WITH MINERALS) TABS Take 1 tablet by mouth daily.     omeprazole (PRILOSEC) 20 MG capsule 1 PO 30 MINS PRIOR TO BREAKFAST. (Patient taking differently: Take 20 mg by mouth daily.) 90 capsule 3   sodium bicarbonate 325 MG tablet Take 325 mg by mouth 3 (three) times daily.     tamsulosin (FLOMAX) 0.4 MG CAPS capsule Take 0.4 mg by mouth daily.      venetoclax (VENCLEXTA) 100 MG tablet Take 3 tablets (300 mg total) by mouth daily. 90 tablet 3    No current facility-administered medications for this visit.    ALLERGIES:  No Known Allergies  PHYSICAL EXAM:  Performance status (ECOG): 1 - Symptomatic but completely ambulatory  There were no vitals filed for this visit. Wt Readings from Last 3 Encounters:  10/31/21 227 lb (103 kg)  10/06/21 226 lb 6.4 oz (102.7 kg)  09/19/21 222 lb 11.2 oz (101 kg)   Physical Exam Vitals reviewed.  Constitutional:  Appearance: Normal appearance.  Cardiovascular:     Rate and Rhythm: Normal rate and regular rhythm.     Pulses: Normal pulses.     Heart sounds: Normal heart sounds.  Pulmonary:     Effort: Pulmonary effort is normal.     Breath sounds: Normal breath sounds.  Musculoskeletal:     Right lower leg: No edema.     Left lower leg: No edema.  Lymphadenopathy:     Cervical: No cervical adenopathy.     Right cervical: No superficial, deep or posterior cervical adenopathy.    Left cervical: No superficial, deep or posterior cervical adenopathy.     Upper Body:     Right upper body: No supraclavicular or axillary adenopathy.     Left upper body: No supraclavicular or axillary adenopathy.     Lower Body: No right inguinal adenopathy. No left inguinal adenopathy.  Neurological:     General: No focal deficit present.     Mental Status: He is alert and oriented to person, place, and time.  Psychiatric:        Mood and Affect: Mood normal.        Behavior: Behavior normal.    LABORATORY DATA:  I have reviewed the labs as listed.     Latest Ref Rng & Units 12/20/2021    8:07 AM 09/12/2021   11:20 AM 06/06/2021    8:03 AM  CBC  WBC 4.0 - 10.5 K/uL 3.1  3.0  2.8   Hemoglobin 13.0 - 17.0 g/dL 12.8  11.7  12.4   Hematocrit 39.0 - 52.0 % 37.1  35.2  35.9   Platelets 150 - 400 K/uL 85  85  90       Latest Ref Rng & Units 12/20/2021    8:07 AM 09/12/2021   11:20 AM 06/06/2021    8:03 AM  CMP  Glucose 70 - 99 mg/dL 105  88  101   BUN 8 - 23 mg/dL 43  44  34   Creatinine 0.61  - 1.24 mg/dL 3.46  3.36  3.59   Sodium 135 - 145 mmol/L 141  139  136   Potassium 3.5 - 5.1 mmol/L 4.4  4.3  4.1   Chloride 98 - 111 mmol/L 109  110  105   CO2 22 - 32 mmol/L '25  24  23   ' Calcium 8.9 - 10.3 mg/dL 9.0  8.7  8.5   Total Protein 6.5 - 8.1 g/dL 6.6  6.2  6.6   Total Bilirubin 0.3 - 1.2 mg/dL 0.9  0.9  1.0   Alkaline Phos 38 - 126 U/L 56  54  67   AST 15 - 41 U/L '15  19  16   ' ALT 0 - 44 U/L '14  15  14       ' Component Value Date/Time   RBC 3.69 (L) 12/20/2021 0807   MCV 100.5 (H) 12/20/2021 0807   MCH 34.7 (H) 12/20/2021 0807   MCHC 34.5 12/20/2021 0807   RDW 12.7 12/20/2021 0807   LYMPHSABS 0.4 (L) 12/20/2021 0807   MONOABS 0.4 12/20/2021 0807   EOSABS 0.0 12/20/2021 0807   BASOSABS 0.0 12/20/2021 0807    DIAGNOSTIC IMAGING:  I have independently reviewed the scans and discussed with the patient. CT CHEST ABDOMEN PELVIS WO CONTRAST  Result Date: 12/20/2021 CLINICAL DATA:  History of chronic lymphocytic leukemia, currently on immunotherapy. Follow-up. * Tracking Code: BO * EXAM: CT CHEST, ABDOMEN AND PELVIS WITHOUT CONTRAST TECHNIQUE:  Multidetector CT imaging of the chest, abdomen and pelvis was performed following the standard protocol without IV contrast. RADIATION DOSE REDUCTION: This exam was performed according to the departmental dose-optimization program which includes automated exposure control, adjustment of the mA and/or kV according to patient size and/or use of iterative reconstruction technique. COMPARISON:  Multiple priors including most recent CT chest abdomen pelvis dated June 06, 2021 FINDINGS: CT CHEST FINDINGS Cardiovascular: Aortic atherosclerosis. Cardiac enlargement. Three chamber coronary artery calcifications. Trace pericardial effusion is similar prior. Mediastinum/Nodes: No suspicious thyroid nodule. Prior right thyroidectomy. No pathologically enlarged mediastinal, hilar or axillary lymph nodes, noting limited sensitivity for the detection of hilar  adenopathy on this noncontrast study. Small hiatal hernia. Lungs/Pleura: Mild centrilobular and paraseptal emphysema. No suspicious pulmonary nodules or masses. No pleural effusion. No pneumothorax. Musculoskeletal: No aggressive lytic or blastic lesion of bone. Thoracic spondylosis. CT ABDOMEN PELVIS FINDINGS Hepatobiliary: Small low-density lesions in the left hepatic lobe are stable and likely reflect cysts. No new suspicious hepatic finding on this noncontrast examination. Gallbladder is unremarkable. No biliary ductal dilation. Pancreas: No pancreatic ductal dilation or evidence of acute inflammation. Spleen: No splenomegaly. Adrenals/Urinary Tract: Bilateral adrenal glands appear normal. No hydronephrosis. Nonobstructive bilateral renal stones measure up to 5 mm. No obstructive ureteral or bladder calculi. Urinary bladder is nondistended limiting evaluation. Stomach/Bowel: Radiopaque enteric contrast material traverses the rectum. Small hiatal hernia otherwise the stomach is unremarkable for degree of distension. No pathologic dilation of small or large bowel. The appendix and terminal ileum appear normal. No evidence of acute bowel inflammation. Sigmoid colonic diverticulosis without findings of acute diverticulitis. Vascular/Lymphatic: No significant interval change in the prominent pelvic lymph nodes. Previously index lymph nodes are as follows: -left external iliac lymph node measures 9 mm in short axis on image 106/2, previously 8 mm. -left external iliac lymph node measures 7 mm in short axis on image 107/2, unchanged. -left inguinal lymph node measures 8 mm in short axis on image 129/2 previously 9 mm. No new pathologically enlarged lymph nodes in the abdomen or pelvis. Reproductive: Prostatomegaly is similar prior. Other: No significant abdominopelvic free fluid. Musculoskeletal: No aggressive lytic or blastic lesion of bone. Multilevel degenerative changes spine. 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 IMPRESSION: 1. Stable prominent pelvic lymph nodes without pathologically enlarged lymph nodes above or below the diaphragm and no splenomegaly. 2. Nonobstructive bilateral renal calculi measure up to 5 mm. 3. Left-sided colonic diverticulosis without findings of acute diverticulitis. 4.  Aortic Atherosclerosis (ICD10-I70.0). Electronically Signed   By: Dahlia Bailiff M.D.   On: 12/20/2021 14:47     ASSESSMENT:  1.  CLL, Rai stage IV: -BMBX on 08/10/2016 shows bone marrow involvement by CLL. -He also had bulky diffuse adenopathy with progressive thrombocytopenia. -Imbruvica 420 mg from 08/19/2016 through May 2018, discontinued due to subdural hematoma on 10/14/2016. -Subsequently offered chlorambucil and obinutuzumab.  He refused obinutuzumab.  Chlorambucil 30 mg every 2 weeks from June 2019 through 11/07/2018 due to severe weakness. -PET scan on 12/01/2019 showed multiple bilateral enlarged cervical lymph nodes, bilateral axillary and mediastinal adenopathy, prevascular lymph nodes, left lower paratracheal lymph node, minimal adenopathy in the retroperitoneum, external iliac lymph nodes.  Spleen measures 16 cm.  All lymph nodes measure less than 5 cm with SUV less than 5. 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 -T p53 mutation is pending. -Venetoclax 10 mg started on 12/02/2019 (dose reduced secondary to Coreg),  dose increased to 50 mg daily on 12/12/2019. -Dose increased to 100 mg daily on 12/19/2019, held on 12/25/2019 due to elevated creatinine. -Venetoclax 100 mg daily started back on 01/15/2020.  Dose increased to 200 mg daily on 07/01/2020.  Dose increased to 300 mg daily on 09/29/2020. - Rituximab 6 cycles completed on 07/29/2020. - CT CAP on 12/16/2020 showed complete resolution of lymphadenopathy in the chest.  Spleen size is normal.  Subcentimeter lymphadenopathy in the left external iliac region and left inguinal  region.   2.  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   PLAN:  1.  CLL, Rai stage IV: - He is tolerating venetoclax 300 mg daily very well. - CT CAP (12/20/2021): Stable subcentimeter pelvic lymph nodes.  No pathologically enlarged lymph nodes above or below the diaphragm.  No splenomegaly.  Nonobstructive renal calculi bilaterally less than 5 mm.  Left-sided colonic diverticulosis. - Reviewed labs which show normal LFTs.  LDH was normal.  CBC shows mild leukopenia with lymphopenia.  Thrombocytopenia between 80-90 K stable.  No bleeding issues reported. - Recommend continuing venetoclax 300 mg daily. - RTC 4 months for follow-up with repeat labs.   2.  CKD: - Creatinine improved to 3.46.  Continue follow-up with Dr. Marval Regal.   3.  Normocytic anemia: - CBC on 12/20/2021 with hemoglobin 12.8. - Ferritin is 102 and percent saturation 23.  No role for parenteral iron therapy.  Orders placed this encounter:  No orders of the defined types were placed in this encounter.    Derek Jack, MD [place] [phone]   I, Thana Ates, am acting as a scribe for Dr. Derek Jack.  I, Derek Jack MD, have reviewed the above documentation for accuracy and completeness, and I agree with the above.

## 2022-01-06 ENCOUNTER — Other Ambulatory Visit (HOSPITAL_COMMUNITY): Payer: Self-pay | Admitting: Hematology

## 2022-01-06 ENCOUNTER — Other Ambulatory Visit (HOSPITAL_COMMUNITY): Payer: Self-pay

## 2022-01-06 DIAGNOSIS — C9110 Chronic lymphocytic leukemia of B-cell type not having achieved remission: Secondary | ICD-10-CM

## 2022-01-09 ENCOUNTER — Other Ambulatory Visit: Payer: Self-pay | Admitting: *Deleted

## 2022-01-09 ENCOUNTER — Other Ambulatory Visit (HOSPITAL_COMMUNITY): Payer: Self-pay

## 2022-01-09 ENCOUNTER — Encounter (HOSPITAL_COMMUNITY): Payer: Self-pay | Admitting: Hematology

## 2022-01-09 MED ORDER — VENETOCLAX 100 MG PO TABS
300.0000 mg | ORAL_TABLET | Freq: Every day | ORAL | 3 refills | Status: DC
  Filled 2022-01-09: qty 90, 30d supply, fill #0
  Filled 2022-02-03: qty 90, 30d supply, fill #1
  Filled 2022-03-09: qty 90, 30d supply, fill #2
  Filled 2022-04-14: qty 90, 30d supply, fill #3

## 2022-01-09 NOTE — Telephone Encounter (Signed)
Refill for Venclexta approved.  Per last ovn, patient tolerating and is to continue therapy.

## 2022-01-16 ENCOUNTER — Other Ambulatory Visit (HOSPITAL_COMMUNITY): Payer: Self-pay

## 2022-01-26 ENCOUNTER — Other Ambulatory Visit (HOSPITAL_COMMUNITY): Payer: Self-pay

## 2022-02-03 ENCOUNTER — Other Ambulatory Visit (HOSPITAL_COMMUNITY): Payer: Self-pay

## 2022-02-16 ENCOUNTER — Other Ambulatory Visit (HOSPITAL_COMMUNITY): Payer: Self-pay

## 2022-02-17 DIAGNOSIS — H524 Presbyopia: Secondary | ICD-10-CM | POA: Diagnosis not present

## 2022-02-17 DIAGNOSIS — Z0100 Encounter for examination of eyes and vision without abnormal findings: Secondary | ICD-10-CM | POA: Diagnosis not present

## 2022-02-21 DIAGNOSIS — Z136 Encounter for screening for cardiovascular disorders: Secondary | ICD-10-CM | POA: Diagnosis not present

## 2022-02-21 DIAGNOSIS — N400 Enlarged prostate without lower urinary tract symptoms: Secondary | ICD-10-CM | POA: Diagnosis not present

## 2022-02-21 DIAGNOSIS — R7301 Impaired fasting glucose: Secondary | ICD-10-CM | POA: Diagnosis not present

## 2022-02-21 DIAGNOSIS — D649 Anemia, unspecified: Secondary | ICD-10-CM | POA: Diagnosis not present

## 2022-02-28 DIAGNOSIS — K2270 Barrett's esophagus without dysplasia: Secondary | ICD-10-CM | POA: Diagnosis not present

## 2022-02-28 DIAGNOSIS — E782 Mixed hyperlipidemia: Secondary | ICD-10-CM | POA: Diagnosis not present

## 2022-02-28 DIAGNOSIS — N184 Chronic kidney disease, stage 4 (severe): Secondary | ICD-10-CM | POA: Diagnosis not present

## 2022-02-28 DIAGNOSIS — D6949 Other primary thrombocytopenia: Secondary | ICD-10-CM | POA: Diagnosis not present

## 2022-02-28 DIAGNOSIS — Z0000 Encounter for general adult medical examination without abnormal findings: Secondary | ICD-10-CM | POA: Diagnosis not present

## 2022-02-28 DIAGNOSIS — I10 Essential (primary) hypertension: Secondary | ICD-10-CM | POA: Diagnosis not present

## 2022-02-28 DIAGNOSIS — C9110 Chronic lymphocytic leukemia of B-cell type not having achieved remission: Secondary | ICD-10-CM | POA: Diagnosis not present

## 2022-02-28 DIAGNOSIS — I719 Aortic aneurysm of unspecified site, without rupture: Secondary | ICD-10-CM | POA: Diagnosis not present

## 2022-02-28 DIAGNOSIS — Z8679 Personal history of other diseases of the circulatory system: Secondary | ICD-10-CM | POA: Diagnosis not present

## 2022-02-28 DIAGNOSIS — Z23 Encounter for immunization: Secondary | ICD-10-CM | POA: Diagnosis not present

## 2022-02-28 DIAGNOSIS — I480 Paroxysmal atrial fibrillation: Secondary | ICD-10-CM | POA: Diagnosis not present

## 2022-02-28 DIAGNOSIS — D649 Anemia, unspecified: Secondary | ICD-10-CM | POA: Diagnosis not present

## 2022-03-09 ENCOUNTER — Other Ambulatory Visit (HOSPITAL_COMMUNITY): Payer: Self-pay

## 2022-03-14 DIAGNOSIS — N200 Calculus of kidney: Secondary | ICD-10-CM | POA: Diagnosis not present

## 2022-03-14 DIAGNOSIS — Z8616 Personal history of COVID-19: Secondary | ICD-10-CM | POA: Diagnosis not present

## 2022-03-14 DIAGNOSIS — D696 Thrombocytopenia, unspecified: Secondary | ICD-10-CM | POA: Diagnosis not present

## 2022-03-14 DIAGNOSIS — I129 Hypertensive chronic kidney disease with stage 1 through stage 4 chronic kidney disease, or unspecified chronic kidney disease: Secondary | ICD-10-CM | POA: Diagnosis not present

## 2022-03-14 DIAGNOSIS — N12 Tubulo-interstitial nephritis, not specified as acute or chronic: Secondary | ICD-10-CM | POA: Diagnosis not present

## 2022-03-14 DIAGNOSIS — I770 Arteriovenous fistula, acquired: Secondary | ICD-10-CM | POA: Diagnosis not present

## 2022-03-14 DIAGNOSIS — N179 Acute kidney failure, unspecified: Secondary | ICD-10-CM | POA: Diagnosis not present

## 2022-03-14 DIAGNOSIS — C9110 Chronic lymphocytic leukemia of B-cell type not having achieved remission: Secondary | ICD-10-CM | POA: Diagnosis not present

## 2022-03-14 DIAGNOSIS — N184 Chronic kidney disease, stage 4 (severe): Secondary | ICD-10-CM | POA: Diagnosis not present

## 2022-03-21 ENCOUNTER — Other Ambulatory Visit (HOSPITAL_COMMUNITY): Payer: Self-pay

## 2022-03-22 ENCOUNTER — Other Ambulatory Visit (HOSPITAL_COMMUNITY): Payer: Self-pay

## 2022-04-14 ENCOUNTER — Other Ambulatory Visit (HOSPITAL_COMMUNITY): Payer: Self-pay

## 2022-04-19 ENCOUNTER — Other Ambulatory Visit (HOSPITAL_COMMUNITY): Payer: Self-pay

## 2022-04-25 ENCOUNTER — Inpatient Hospital Stay: Payer: Medicare HMO | Attending: Hematology

## 2022-04-25 DIAGNOSIS — D509 Iron deficiency anemia, unspecified: Secondary | ICD-10-CM

## 2022-04-25 DIAGNOSIS — C9110 Chronic lymphocytic leukemia of B-cell type not having achieved remission: Secondary | ICD-10-CM | POA: Diagnosis not present

## 2022-04-25 DIAGNOSIS — N189 Chronic kidney disease, unspecified: Secondary | ICD-10-CM | POA: Diagnosis not present

## 2022-04-25 DIAGNOSIS — D649 Anemia, unspecified: Secondary | ICD-10-CM | POA: Insufficient documentation

## 2022-04-25 LAB — IRON AND TIBC
Iron: 101 ug/dL (ref 45–182)
Saturation Ratios: 32 % (ref 17.9–39.5)
TIBC: 319 ug/dL (ref 250–450)
UIBC: 218 ug/dL

## 2022-04-25 LAB — CBC WITH DIFFERENTIAL/PLATELET
Abs Immature Granulocytes: 0.01 10*3/uL (ref 0.00–0.07)
Basophils Absolute: 0 10*3/uL (ref 0.0–0.1)
Basophils Relative: 0 %
Eosinophils Absolute: 0 10*3/uL (ref 0.0–0.5)
Eosinophils Relative: 1 %
HCT: 36.8 % — ABNORMAL LOW (ref 39.0–52.0)
Hemoglobin: 12.7 g/dL — ABNORMAL LOW (ref 13.0–17.0)
Immature Granulocytes: 0 %
Lymphocytes Relative: 13 %
Lymphs Abs: 0.4 10*3/uL — ABNORMAL LOW (ref 0.7–4.0)
MCH: 35 pg — ABNORMAL HIGH (ref 26.0–34.0)
MCHC: 34.5 g/dL (ref 30.0–36.0)
MCV: 101.4 fL — ABNORMAL HIGH (ref 80.0–100.0)
Monocytes Absolute: 0.4 10*3/uL (ref 0.1–1.0)
Monocytes Relative: 14 %
Neutro Abs: 2.1 10*3/uL (ref 1.7–7.7)
Neutrophils Relative %: 72 %
Platelets: 84 10*3/uL — ABNORMAL LOW (ref 150–400)
RBC: 3.63 MIL/uL — ABNORMAL LOW (ref 4.22–5.81)
RDW: 13 % (ref 11.5–15.5)
WBC: 2.9 10*3/uL — ABNORMAL LOW (ref 4.0–10.5)
nRBC: 0 % (ref 0.0–0.2)

## 2022-04-25 LAB — COMPREHENSIVE METABOLIC PANEL
ALT: 15 U/L (ref 0–44)
AST: 17 U/L (ref 15–41)
Albumin: 3.9 g/dL (ref 3.5–5.0)
Alkaline Phosphatase: 65 U/L (ref 38–126)
Anion gap: 5 (ref 5–15)
BUN: 42 mg/dL — ABNORMAL HIGH (ref 8–23)
CO2: 24 mmol/L (ref 22–32)
Calcium: 8.8 mg/dL — ABNORMAL LOW (ref 8.9–10.3)
Chloride: 110 mmol/L (ref 98–111)
Creatinine, Ser: 3.36 mg/dL — ABNORMAL HIGH (ref 0.61–1.24)
GFR, Estimated: 18 mL/min — ABNORMAL LOW (ref >60)
Glucose, Bld: 105 mg/dL — ABNORMAL HIGH (ref 70–99)
Potassium: 4.6 mmol/L (ref 3.5–5.1)
Sodium: 139 mmol/L (ref 135–145)
Total Bilirubin: 0.9 mg/dL (ref 0.3–1.2)
Total Protein: 6 g/dL — ABNORMAL LOW (ref 6.5–8.1)

## 2022-04-25 LAB — FERRITIN: Ferritin: 91 ng/mL (ref 24–336)

## 2022-04-25 LAB — LACTATE DEHYDROGENASE: LDH: 135 U/L (ref 98–192)

## 2022-04-26 ENCOUNTER — Other Ambulatory Visit: Payer: Medicare HMO

## 2022-05-02 ENCOUNTER — Inpatient Hospital Stay: Payer: Medicare HMO | Attending: Hematology | Admitting: Hematology

## 2022-05-02 VITALS — BP 140/82 | HR 66 | Temp 97.2°F | Resp 17 | Ht 74.0 in | Wt 235.4 lb

## 2022-05-02 DIAGNOSIS — N184 Chronic kidney disease, stage 4 (severe): Secondary | ICD-10-CM | POA: Diagnosis not present

## 2022-05-02 DIAGNOSIS — Z87891 Personal history of nicotine dependence: Secondary | ICD-10-CM | POA: Diagnosis not present

## 2022-05-02 DIAGNOSIS — D509 Iron deficiency anemia, unspecified: Secondary | ICD-10-CM | POA: Diagnosis not present

## 2022-05-02 DIAGNOSIS — D649 Anemia, unspecified: Secondary | ICD-10-CM | POA: Insufficient documentation

## 2022-05-02 DIAGNOSIS — C9110 Chronic lymphocytic leukemia of B-cell type not having achieved remission: Secondary | ICD-10-CM | POA: Diagnosis not present

## 2022-05-02 DIAGNOSIS — I129 Hypertensive chronic kidney disease with stage 1 through stage 4 chronic kidney disease, or unspecified chronic kidney disease: Secondary | ICD-10-CM | POA: Insufficient documentation

## 2022-05-02 DIAGNOSIS — D696 Thrombocytopenia, unspecified: Secondary | ICD-10-CM | POA: Diagnosis not present

## 2022-05-02 NOTE — Patient Instructions (Signed)
[facility] at [facility] Discharge Instructions   You were seen and examined today by Dr. Delton Coombes.  He reviewed the results of your lab work which are normal/stable.  You may start taking iron tablets 325 mg daily on Monday, Wednesday, and Friday.   We will see you back in 4 months w/repeat labs.    Thank you for choosing [employer] at [facility] to provide your oncology and hematology care.  To afford each patient quality time with our provider, please arrive at least 15 minutes before your scheduled appointment time.   If you have a lab appointment with the [facility] please come in thru the Main Entrance and check in at the main information desk.  You need to re-schedule your appointment should you arrive 10 or more minutes late.  We strive to give you quality time with our providers, and arriving late affects you and other patients whose appointments are after yours.  Also, if you no show three or more times for appointments you may be dismissed from the clinic at the providers discretion.     Again, thank you for choosing [facility].  Our hope is that these requests will decrease the amount of time that you wait before being seen by our physicians.       _____________________________________________________________  Should you have questions after your visit to [facility], please contact our office at [phone] and follow the prompts.  Our office hours are 8:00 a.m. and 4:30 p.m. Monday - Friday.  Please note that voicemails left after 4:00 p.m. may not be returned until the following business day.  We are closed weekends and major holidays.  You do have access to a nurse 24-7, just call the main number to the clinic [phone] and do not press any options, hold on the line and a nurse will answer the phone.    For prescription refill requests, have your pharmacy contact our office and allow 72 hours.     Due to Covid, you will need to wear a mask upon entering the hospital. If you do not have a mask, a mask will be given to you at the Main Entrance upon arrival. For doctor visits, patients may have 1 support person age 79 or older with them. For treatment visits, patients can not have anyone with them due to social distancing guidelines and our immunocompromised population.

## 2022-05-03 ENCOUNTER — Ambulatory Visit: Payer: Medicare HMO | Admitting: Hematology

## 2022-05-11 ENCOUNTER — Other Ambulatory Visit: Payer: Self-pay

## 2022-05-12 ENCOUNTER — Other Ambulatory Visit (HOSPITAL_COMMUNITY): Payer: Self-pay

## 2022-05-12 ENCOUNTER — Other Ambulatory Visit: Payer: Self-pay

## 2022-05-12 ENCOUNTER — Other Ambulatory Visit (HOSPITAL_COMMUNITY): Payer: Self-pay | Admitting: Hematology

## 2022-05-12 DIAGNOSIS — C9110 Chronic lymphocytic leukemia of B-cell type not having achieved remission: Secondary | ICD-10-CM

## 2022-05-12 MED ORDER — VENETOCLAX 100 MG PO TABS
300.0000 mg | ORAL_TABLET | Freq: Every day | ORAL | 3 refills | Status: DC
  Filled 2022-05-12: qty 90, 30d supply, fill #0
  Filled 2022-06-13: qty 90, 30d supply, fill #1
  Filled 2022-07-11: qty 90, 30d supply, fill #2
  Filled 2022-08-03: qty 90, 30d supply, fill #3

## 2022-05-16 ENCOUNTER — Other Ambulatory Visit (HOSPITAL_COMMUNITY): Payer: Self-pay

## 2022-05-16 ENCOUNTER — Other Ambulatory Visit: Payer: Self-pay

## 2022-05-26 ENCOUNTER — Encounter (HOSPITAL_COMMUNITY): Payer: Self-pay | Admitting: Hematology

## 2022-05-26 NOTE — Progress Notes (Signed)
Andrew [name], [place] [id number]   CLINIC:  Medical Oncology/Hematology  PCP:  [name], MD [address] [name] [address]  [phone]  REASON FOR VISIT:  Follow-up for CLL  PRIOR THERAPY: none  CURRENT THERAPY: Venetoclax 300 mg daily  INTERVAL HISTORY:  Andrew [name], a 81 y.o. male, seen for follow-up of CLL.  He is tolerating venetoclax without any problems.  Energy levels are 75%.  He has occasional diarrhea which is also stable.  REVIEW OF SYSTEMS:  Review of Systems  Constitutional:  Negative for appetite change.  Gastrointestinal:  Positive for diarrhea.  Neurological:  Positive for numbness (Tingling in the feet).  All other systems reviewed and are negative.   PAST MEDICAL/SURGICAL HISTORY:  Past Medical History:  Diagnosis Date   Arthritis    Ascending aortic aneurysm (HCC)    CKD (chronic kidney disease), stage IV ([place])    followed by [facility]   CLL (chronic lymphocytic leukemia) ([place])    Complication of anesthesia    "wild when I woke up "   History of kidney stones    passed    Hypertension    NICM (nonischemic cardiomyopathy) ([place] [facility])    a. cath 2007, EF 35%, minimal CAD. b. EF normal 2018.   PAF (paroxysmal atrial fibrillation) (HCC)    Pinched nerve in neck    Pneumonia    SDH (subdural hematoma) (HCC)    Thrombocytopenia (HCC)    Past Surgical History:  Procedure Laterality Date   AV FISTULA PLACEMENT Left 04/30/2020   Procedure: LEFT ARM BRACHIOCEPHALIC ARTERIOVENOUS (AV) FISTULA CREATION;  Surgeon: Serafina Mitchell, MD;  Location: [facility];  Service: Vascular;  Laterality: Left;   BIOPSY  08/05/2018   Procedure: BIOPSY;  Surgeon: Danie Binder, MD;  [facility];  Service: Endoscopy;;  gastric    Bone marrow biospy Bilateral    CATARACT EXTRACTION, BILATERAL     COLONOSCOPY  07/10/2011   Procedure: COLONOSCOPY;  Surgeon: Dorothyann Peng, MD;  [facility];   Service: Endoscopy;  Laterality: N/A;  10:30 AM   CRANIOTOMY N/A 10/14/2016   Procedure: CRANIOTOMY HEMATOMA EVACUATION SUBDURAL;  Surgeon: Jovita Gamma, MD;  Location: [facility];  Service: Neurosurgery;  Laterality: N/A;  CRANIOTOMY HEMATOMA EVACUATION SUBDURAL   ESOPHAGOGASTRODUODENOSCOPY N/A 08/05/2018   Procedure: ESOPHAGOGASTRODUODENOSCOPY (EGD);  Surgeon: Danie Binder, MD;  [facility];  Service: Endoscopy;  Laterality: N/A;  3:00pm   THYROIDECTOMY, PARTIAL     TONSILLECTOMY      SOCIAL HISTORY:  Social History   Socioeconomic History   Marital status: Married    Spouse name: Not on file   Number of children: Not on file   Years of education: Not on file   Highest education level: Not on file  Occupational History   Not on file  Tobacco Use   Smoking status: Former    Packs/day: 1.00    Years: 15.00    Total pack years: 15.00    Types: Cigarettes    Quit date: 01/26/1971    Years since quitting: 51.3   Smokeless tobacco: Never  Vaping Use   Vaping Use: Never used  Substance and Sexual Activity   Alcohol use: No   Drug use: No   Sexual activity: Not on file  Other Topics Concern   Not on file  Social History Narrative   Not on file  Social Determinants of Health   Financial Resource Strain: Low Risk  (08/30/2020)   Overall Financial Resource Strain (CARDIA)    Difficulty of Paying Living Expenses: Not hard at all  Food Insecurity: No Food Insecurity (08/30/2020)   Hunger Vital Sign    Worried About Running Out of Food in the Last Year: Never true    Clay in the Last Year: Never true  Transportation Needs: No Transportation Needs (08/30/2020)   PRAPARE - Hydrologist (Medical): No    Lack of Transportation (Non-Medical): No  Physical Activity: Inactive (08/30/2020)   Exercise Vital Sign    Days of Exercise per Week: 0 days    Minutes of Exercise per Session: 0 min  Stress: No Stress Concern Present (08/30/2020)    [place]    Feeling of Stress : Not at all  Social Connections: Moderately Integrated (08/30/2020)   Social Connection and Isolation Panel [NHANES]    Frequency of Communication with Friends and Family: More than three times a week    Frequency of Social Gatherings with Friends and Family: More than three times a week    Attends Religious Services: More than 4 times per year    Active Member of [employer] or Organizations: No    Attends Archivist Meetings: Never    Marital Status: Married  Human resources officer Violence: Not At Risk (08/30/2020)   Humiliation, Afraid, Rape, and Kick questionnaire    Fear of Current or Ex-Partner: No    Emotionally Abused: No    Physically Abused: No    Sexually Abused: No    FAMILY HISTORY:  Family History  Problem Relation Age of Onset   CAD Father 51   Cancer Mother    Alzheimer's disease Mother    Colon cancer Neg Hx     CURRENT MEDICATIONS:  Current Outpatient Medications  Medication Sig Dispense Refill   amLODipine (NORVASC) 5 MG tablet Take 5 mg by mouth daily.     carvedilol (COREG) 6.25 MG tablet Take 1 tablet (6.25 mg total) by mouth 2 (two) times daily. 180 tablet 3   Cholecalciferol (VITAMIN D3) 25 MCG (1000 UT) CAPS Take 1 capsule by mouth daily.     Cyanocobalamin (VITAMIN B12) 1000 MCG TBCR Take 1,000 mcg by mouth daily.      hydrochlorothiazide (HYDRODIURIL) 12.5 MG tablet Take 12.5 mg by mouth daily.     Multiple Vitamin (MULITIVITAMIN WITH MINERALS) TABS Take 1 tablet by mouth daily.     omeprazole (PRILOSEC) 20 MG capsule 1 PO 30 MINS PRIOR TO BREAKFAST. (Patient taking differently: Take 20 mg by mouth daily.) 90 capsule 3   sodium bicarbonate 325 MG tablet Take 325 mg by mouth 3 (three) times daily.     tamsulosin (FLOMAX) 0.4 MG CAPS capsule Take 0.4 mg by mouth daily.      venetoclax (VENCLEXTA) 100 MG tablet Take 3 tablets (300 mg total) by mouth daily. 90  tablet 3   No current facility-administered medications for this visit.    ALLERGIES:  No Known Allergies  PHYSICAL EXAM:  Performance status (ECOG): 1 - Symptomatic but completely ambulatory  Vitals:   05/02/22 1155  BP: (!) 140/82  Pulse: 66  Resp: 17  Temp: (!) 97.2 F (36.2 C)  SpO2: 99%   Wt Readings from Last 3 Encounters:  05/02/22 235 lb 6.4 oz (106.8 kg)  12/27/21 227 lb 14.4 oz (103.4  kg)  10/31/21 227 lb (103 kg)   Physical Exam Vitals reviewed.  Constitutional:      Appearance: Normal appearance.  Cardiovascular:     Rate and Rhythm: Normal rate and regular rhythm.     Pulses: Normal pulses.     Heart sounds: Normal heart sounds.  Pulmonary:     Effort: Pulmonary effort is normal.     Breath sounds: Normal breath sounds.  Musculoskeletal:     Right lower leg: No edema.     Left lower leg: No edema.  Lymphadenopathy:     Cervical: No cervical adenopathy.     Right cervical: No superficial, deep or posterior cervical adenopathy.    Left cervical: No superficial, deep or posterior cervical adenopathy.     Upper Body:     Right upper body: No supraclavicular or axillary adenopathy.     Left upper body: No supraclavicular or axillary adenopathy.     Lower Body: No right inguinal adenopathy. No left inguinal adenopathy.  Neurological:     General: No focal deficit present.     Mental Status: He is alert and oriented to person, place, and time.  Psychiatric:        Mood and Affect: Mood normal.        Behavior: Behavior normal.     LABORATORY DATA:  I have reviewed the labs as listed.     Latest Ref Rng & Units 04/25/2022    8:09 AM 12/20/2021    8:07 AM 09/12/2021   11:20 AM  CBC  WBC 4.0 - 10.5 K/uL 2.9  3.1  3.0   Hemoglobin 13.0 - 17.0 g/dL 12.7  12.8  11.7   Hematocrit 39.0 - 52.0 % 36.8  37.1  35.2   Platelets 150 - 400 K/uL 84  85  85       Latest Ref Rng & Units 04/25/2022    8:09 AM 12/20/2021    8:07 AM 09/12/2021   11:20 AM  CMP   Glucose 70 - 99 mg/dL 105  105  88   BUN 8 - 23 mg/dL 42  43  44   Creatinine 0.61 - 1.24 mg/dL 3.36  3.46  3.36   Sodium 135 - 145 mmol/L 139  141  139   Potassium 3.5 - 5.1 mmol/L 4.6  4.4  4.3   Chloride 98 - 111 mmol/L 110  109  110   CO2 22 - 32 mmol/L _0 Calcium 8.9 - 10.3 mg/dL 8.8  9.0  8.7   Total Protein 6.5 - 8.1 g/dL 6.0  6.6  6.2   Total Bilirubin 0.3 - 1.2 mg/dL 0.9  0.9  0.9   Alkaline Phos 38 - 126 U/L 65  56  54   AST 15 - 41 U/L _1 ALT 0 - 44 U/L _2 Component Value Date/Time   RBC 3.63 (L) 04/25/2022 0809   MCV 101.4 (H) 04/25/2022 0809   MCH 35.0 (H) 04/25/2022 0809   MCHC 34.5 04/25/2022 0809   RDW 13.0 04/25/2022 0809   LYMPHSABS 0.4 (L) 04/25/2022 0809   MONOABS 0.4 04/25/2022 0809   EOSABS 0.0 04/25/2022 0809   BASOSABS 0.0 04/25/2022 0809    DIAGNOSTIC IMAGING:  I have independently reviewed the scans and discussed with the patient. No results found.   ASSESSMENT:  1.  CLL, Rai stage IV: -BMBX on  08/10/2016 shows bone marrow involvement by CLL. -He also had bulky diffuse adenopathy with progressive thrombocytopenia. -Imbruvica 420 mg from 08/19/2016 through May 2018, discontinued due to subdural hematoma on 10/14/2016. -Subsequently offered chlorambucil and obinutuzumab.  He refused obinutuzumab.  Chlorambucil 30 mg every 2 weeks from June 2019 through 11/07/2018 due to severe weakness. -PET scan on 12/01/2019 showed multiple bilateral enlarged cervical lymph nodes, bilateral axillary and mediastinal adenopathy, prevascular lymph nodes, left lower paratracheal lymph node, minimal adenopathy in the retroperitoneum, external iliac lymph nodes.  Spleen measures 16 cm.  All lymph nodes measure less than 5 cm with SUV less than 5. 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 -T p53 mutation is pending. -Venetoclax 10 mg started on 12/02/2019 (dose reduced secondary to Coreg), dose increased to 50 mg daily on  12/12/2019. -Dose increased to 100 mg daily on 12/19/2019, held on 12/25/2019 due to elevated creatinine. -Venetoclax 100 mg daily started back on 01/15/2020.  Dose increased to 200 mg daily on 07/01/2020.  Dose increased to 300 mg daily on 09/29/2020. - Rituximab 6 cycles completed on 07/29/2020. - CT CAP on 12/16/2020 showed complete resolution of lymphadenopathy in the chest.  Spleen size is normal.  Subcentimeter lymphadenopathy in the left external iliac region and left inguinal region.   2.  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   PLAN:  1.  CLL, Rai stage IV: - CT CAP on 12/20/2021 stable subcentimeter pelvic lymph nodes with no pathologically enlarged lymph nodes above or below the diaphragm with no splenomegaly.  Nonobstructive renal calculi bilateral less than 5 mm.  Left-sided colonic diverticulosis. - Reviewed labs today which showed white count 2.9 and ANC normal.  Platelet count is 84 and stable.  Hemoglobin 12.7.  LFTs are stable.  Creatinine is 3.36 and stable. - Continue venetoclax 300 mg daily which she is tolerating well. - RTC 4 months for follow-up with repeat labs and exam.   2.  CKD: - Creatinine is 3.36.  Continue to follow-up with Dr. Marval Regal.   3.  Normocytic anemia: - Hemoglobin is 12.7.  Ferritin is 91 and percent saturation 32. - I have recommended that he start taking iron tablet 3 times weekly.  Orders placed this encounter:  Orders Placed This Encounter  Procedures   CBC with Differential   Comprehensive metabolic panel   Iron and TIBC ([facility] ONLY)   Ferritin   Lactate dehydrogenase      Derek Jack, MD [place] [phone]

## 2022-06-06 DIAGNOSIS — N184 Chronic kidney disease, stage 4 (severe): Secondary | ICD-10-CM | POA: Diagnosis not present

## 2022-06-06 DIAGNOSIS — C9110 Chronic lymphocytic leukemia of B-cell type not having achieved remission: Secondary | ICD-10-CM | POA: Diagnosis not present

## 2022-06-06 DIAGNOSIS — N200 Calculus of kidney: Secondary | ICD-10-CM | POA: Diagnosis not present

## 2022-06-06 DIAGNOSIS — N12 Tubulo-interstitial nephritis, not specified as acute or chronic: Secondary | ICD-10-CM | POA: Diagnosis not present

## 2022-06-06 DIAGNOSIS — N179 Acute kidney failure, unspecified: Secondary | ICD-10-CM | POA: Diagnosis not present

## 2022-06-06 DIAGNOSIS — I129 Hypertensive chronic kidney disease with stage 1 through stage 4 chronic kidney disease, or unspecified chronic kidney disease: Secondary | ICD-10-CM | POA: Diagnosis not present

## 2022-06-06 DIAGNOSIS — Z8616 Personal history of COVID-19: Secondary | ICD-10-CM | POA: Diagnosis not present

## 2022-06-06 DIAGNOSIS — N390 Urinary tract infection, site not specified: Secondary | ICD-10-CM | POA: Diagnosis not present

## 2022-06-06 DIAGNOSIS — I770 Arteriovenous fistula, acquired: Secondary | ICD-10-CM | POA: Diagnosis not present

## 2022-06-06 DIAGNOSIS — D696 Thrombocytopenia, unspecified: Secondary | ICD-10-CM | POA: Diagnosis not present

## 2022-06-13 ENCOUNTER — Other Ambulatory Visit (HOSPITAL_COMMUNITY): Payer: Self-pay

## 2022-06-14 ENCOUNTER — Other Ambulatory Visit (HOSPITAL_COMMUNITY): Payer: Self-pay

## 2022-06-21 DIAGNOSIS — E755 Other lipid storage disorders: Secondary | ICD-10-CM | POA: Diagnosis not present

## 2022-07-03 ENCOUNTER — Encounter (HOSPITAL_COMMUNITY): Payer: Self-pay | Admitting: Hematology

## 2022-07-03 ENCOUNTER — Other Ambulatory Visit (HOSPITAL_COMMUNITY): Payer: Self-pay

## 2022-07-03 ENCOUNTER — Telehealth: Payer: Self-pay

## 2022-07-03 NOTE — Telephone Encounter (Signed)
Oral Oncology Patient Advocate Encounter   Received notification from [facility] that patient received letter in mail to renew grant.   Was successful in securing patient an $3,250 grant from [employer] [facility]) to provide copayment coverage for Venlcexta.  This will keep the out of pocket expense at $0.     I have spoken with the patient.    The billing information is as follows and has been shared with [facility].   Member ID: [id number] Group ID: [id number] RxBin: [id number] Dates of Eligibility: 08/02/22 through 08/01/23  Fund:  Chronic Lymphocytic [place], [place] Patient [place]  [phone] (phone) [phone] (fax) 07/03/2022 12:06 PM

## 2022-07-11 ENCOUNTER — Other Ambulatory Visit (HOSPITAL_COMMUNITY): Payer: Self-pay

## 2022-07-12 ENCOUNTER — Other Ambulatory Visit: Payer: Self-pay

## 2022-08-02 ENCOUNTER — Other Ambulatory Visit (HOSPITAL_COMMUNITY): Payer: Self-pay

## 2022-08-03 ENCOUNTER — Other Ambulatory Visit (HOSPITAL_COMMUNITY): Payer: Self-pay

## 2022-08-08 ENCOUNTER — Other Ambulatory Visit (HOSPITAL_COMMUNITY): Payer: Self-pay

## 2022-08-22 DIAGNOSIS — D649 Anemia, unspecified: Secondary | ICD-10-CM | POA: Diagnosis not present

## 2022-08-22 DIAGNOSIS — R7301 Impaired fasting glucose: Secondary | ICD-10-CM | POA: Diagnosis not present

## 2022-08-28 DIAGNOSIS — Z0000 Encounter for general adult medical examination without abnormal findings: Secondary | ICD-10-CM | POA: Diagnosis not present

## 2022-08-29 ENCOUNTER — Encounter: Payer: Self-pay | Admitting: Internal Medicine

## 2022-08-29 DIAGNOSIS — I5022 Chronic systolic (congestive) heart failure: Secondary | ICD-10-CM | POA: Diagnosis not present

## 2022-08-29 DIAGNOSIS — D649 Anemia, unspecified: Secondary | ICD-10-CM | POA: Diagnosis not present

## 2022-08-29 DIAGNOSIS — N184 Chronic kidney disease, stage 4 (severe): Secondary | ICD-10-CM | POA: Diagnosis not present

## 2022-08-29 DIAGNOSIS — I719 Aortic aneurysm of unspecified site, without rupture: Secondary | ICD-10-CM | POA: Diagnosis not present

## 2022-08-29 DIAGNOSIS — C9110 Chronic lymphocytic leukemia of B-cell type not having achieved remission: Secondary | ICD-10-CM | POA: Diagnosis not present

## 2022-08-29 DIAGNOSIS — E782 Mixed hyperlipidemia: Secondary | ICD-10-CM | POA: Diagnosis not present

## 2022-08-29 DIAGNOSIS — I700 Atherosclerosis of aorta: Secondary | ICD-10-CM | POA: Diagnosis not present

## 2022-08-29 DIAGNOSIS — I130 Hypertensive heart and chronic kidney disease with heart failure and stage 1 through stage 4 chronic kidney disease, or unspecified chronic kidney disease: Secondary | ICD-10-CM | POA: Diagnosis not present

## 2022-08-29 DIAGNOSIS — D6949 Other primary thrombocytopenia: Secondary | ICD-10-CM | POA: Diagnosis not present

## 2022-08-29 DIAGNOSIS — K2270 Barrett's esophagus without dysplasia: Secondary | ICD-10-CM | POA: Diagnosis not present

## 2022-08-29 DIAGNOSIS — Z8679 Personal history of other diseases of the circulatory system: Secondary | ICD-10-CM | POA: Diagnosis not present

## 2022-08-29 DIAGNOSIS — R7301 Impaired fasting glucose: Secondary | ICD-10-CM | POA: Diagnosis not present

## 2022-08-31 ENCOUNTER — Inpatient Hospital Stay: Payer: Medicare HMO | Attending: Hematology

## 2022-08-31 ENCOUNTER — Other Ambulatory Visit (HOSPITAL_COMMUNITY): Payer: Self-pay

## 2022-08-31 ENCOUNTER — Other Ambulatory Visit: Payer: Self-pay | Admitting: Hematology

## 2022-08-31 DIAGNOSIS — D649 Anemia, unspecified: Secondary | ICD-10-CM | POA: Insufficient documentation

## 2022-08-31 DIAGNOSIS — D509 Iron deficiency anemia, unspecified: Secondary | ICD-10-CM

## 2022-08-31 DIAGNOSIS — Z87891 Personal history of nicotine dependence: Secondary | ICD-10-CM | POA: Insufficient documentation

## 2022-08-31 DIAGNOSIS — I129 Hypertensive chronic kidney disease with stage 1 through stage 4 chronic kidney disease, or unspecified chronic kidney disease: Secondary | ICD-10-CM | POA: Insufficient documentation

## 2022-08-31 DIAGNOSIS — N189 Chronic kidney disease, unspecified: Secondary | ICD-10-CM | POA: Insufficient documentation

## 2022-08-31 DIAGNOSIS — C9110 Chronic lymphocytic leukemia of B-cell type not having achieved remission: Secondary | ICD-10-CM | POA: Insufficient documentation

## 2022-08-31 LAB — IRON AND TIBC
Iron: 93 ug/dL (ref 45–182)
Saturation Ratios: 30 % (ref 17.9–39.5)
TIBC: 315 ug/dL (ref 250–450)
UIBC: 222 ug/dL

## 2022-08-31 LAB — FERRITIN: Ferritin: 86 ng/mL (ref 24–336)

## 2022-08-31 LAB — LACTATE DEHYDROGENASE: LDH: 142 U/L (ref 98–192)

## 2022-08-31 MED ORDER — VENETOCLAX 100 MG PO TABS
300.0000 mg | ORAL_TABLET | Freq: Every day | ORAL | 3 refills | Status: DC
  Filled 2022-08-31: qty 90, 30d supply, fill #0
  Filled 2022-09-06: qty 84, 28d supply, fill #0
  Filled 2022-10-03: qty 84, 28d supply, fill #1
  Filled 2022-11-01: qty 84, 28d supply, fill #2
  Filled 2022-11-29: qty 84, 28d supply, fill #3

## 2022-09-01 ENCOUNTER — Other Ambulatory Visit: Payer: Self-pay

## 2022-09-01 ENCOUNTER — Other Ambulatory Visit (HOSPITAL_COMMUNITY): Payer: Self-pay

## 2022-09-05 ENCOUNTER — Other Ambulatory Visit (HOSPITAL_COMMUNITY): Payer: Self-pay

## 2022-09-06 ENCOUNTER — Other Ambulatory Visit: Payer: Self-pay

## 2022-09-06 ENCOUNTER — Other Ambulatory Visit (HOSPITAL_COMMUNITY): Payer: Self-pay

## 2022-09-06 NOTE — Progress Notes (Signed)
[facility] [address] [place] [address]    Clinic Day:  09/07/2022  Referring physician: Benita Stabile, MD  Patient Care Team: Benita Stabile, MD as PCP - General (Internal Medicine) Wyline Mood Dorothe Pea, MD as PCP - Cardiology (Cardiology) Lanelle Bal, DO as Consulting Physician (Internal Medicine) Doreatha Massed, MD as Consulting Physician (Hematology)   ASSESSMENT & PLAN:   Assessment: 1.  CLL, Rai stage IV: -BMBX on 08/10/2016 shows bone marrow involvement by CLL. -He also had bulky diffuse adenopathy with progressive thrombocytopenia. -Imbruvica 420 mg from 08/19/2016 through May 2018, discontinued due to subdural hematoma on 10/14/2016. -Subsequently offered chlorambucil and obinutuzumab.  He refused obinutuzumab.  Chlorambucil 30 mg every 2 weeks from June 2019 through 11/07/2018 due to severe weakness. -PET scan on 12/01/2019 showed multiple bilateral enlarged cervical lymph nodes, bilateral axillary and mediastinal adenopathy, prevascular lymph nodes, left lower paratracheal lymph node, minimal adenopathy in the retroperitoneum, external iliac lymph nodes.  Spleen measures 16 cm.  All lymph nodes measure less than 5 cm with SUV less than 5. 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 -T p53 mutation is pending. -Venetoclax 10 mg started on 12/02/2019 (dose reduced secondary to Coreg), dose increased to 50 mg daily on 12/12/2019. -Dose increased to 100 mg daily on 12/19/2019, held on 12/25/2019 due to elevated creatinine. -Venetoclax 100 mg daily started back on 01/15/2020.  Dose increased to 200 mg daily on 07/01/2020.  Dose increased to 300 mg daily on 09/29/2020. - Rituximab 6 cycles completed on 07/29/2020. - CT CAP on 12/16/2020 showed complete resolution of lymphadenopathy in the chest.  Spleen size is normal.  Subcentimeter lymphadenopathy in the left external iliac region and left inguinal region.   2.  CKD: -Kidney biopsy on 12/19/2017  shows CLL involvement with diffuse severe interstitial nephritis and associated tubular centric granulomatous reaction.   Plan: 1.  CLL, Rai stage IV: - CT CAP on 12/20/2021: Stable subcentimeter pelvic lymph nodes with no pathologically enlarged lymph nodes above or below the diaphragm with no splenomegaly.  Nonobstructive renal calculi bilateral less than 5 mm.  Left-sided colonic diverticulosis. - He is tolerating venetoclax very well. - Physical exam today: No palpable adenopathy or splenomegaly. - No recent infections noted. - Labs from 08/22/2022: WBC-2.3, ANC-1.5, ALC-0.3, PLT-75, Hb-12.5. - Recommend continuing venetoclax 300 mg daily.  RTC 4 months for follow-up.  Plan to repeat CT CAP without contrast prior to next visit along with labs.   2.  CKD: - Creatinine on 08/22/2022 was 3.05, down from 3.36 in November 2023. - Continue follow-up with Dr. Arrie Aran.   3.  Normocytic anemia: - He is tolerating iron tablet 3 times weekly very well. - Ferritin is 86, down from 91.  Percent saturation is 30.  Hemoglobin is stable at 12.5. - Continue iron tablet 3 times weekly.  If there is any worsening, will give parenteral iron therapy.  Orders Placed This Encounter  Procedures   CT CHEST ABDOMEN PELVIS WO CONTRAST    Standing Status:   Future    Standing Expiration Date:   09/07/2023    Order Specific Question:   Preferred imaging location?    Answer:   [facility]    Order Specific Question:   Is Oral Contrast requested for this exam?    Answer:   Yes, Per Radiology protocol    Order Specific Question:   Does the patient have a contrast media/X-ray  dye allergy?    Answer:   No      I,Katie Daubenspeck,acting as a scribe for Doreatha Massed, MD.,have documented all relevant documentation on the behalf of Doreatha Massed, MD,as directed by  Doreatha Massed, MD while in the presence of Doreatha Massed, MD.   I, Doreatha Massed MD, have reviewed the above  documentation for accuracy and completeness, and I agree with the above.   Doreatha Massed, MD   4/11/202410:28 AM  CHIEF COMPLAINT:   Diagnosis: CLL    Cancer Staging  CLL (chronic lymphocytic leukemia) Staging form: Chronic Lymphocytic Leukemia / Small Lymphocytic Lymphoma, AJCC 8th Edition - Clinical stage from 09/04/2016: Modified Rai Stage IV (Modified Rai risk: High, Binet: Stage C, Lymphocytosis: Present, Adenopathy: Present, Organomegaly: Present, Anemia: Absent, Thrombocytopenia: Present) - Signed by Ellouise Newer, PA-C on 09/04/2016    Prior Therapy: none  Current Therapy:  Venetoclax 300 mg daily    HISTORY OF PRESENT ILLNESS:   Oncology History  CLL (chronic lymphocytic leukemia)  03/09/2015 Initial Diagnosis   CLL (chronic lymphocytic leukemia) (HCC)   08/03/2016 Imaging   CT neck- 1. Diffuse and bulky lymphadenopathy in keeping with history of CLL. Nodal enlargement has diffusely progressed since April 2016. 2. Chest and abdomen CT reported separately.   08/03/2016 Imaging   CT CAP- 1. Bulky lymphadenopathy in the chest, abdomen, and pelvis as described. 2. Splenomegaly. 3. Coronary artery and thoracoabdominal aortic atherosclerosis. 4. Prostatomegaly. 5. Several tiny lucent lesions in the bony anatomic pelvis, nonspecific, but attention on follow-up imaging recommended.   08/10/2016 Procedure   Bone marrow aspiration and biopsy by IR.   08/11/2016 Pathology Results   Bone Marrow, Aspirate,Biopsy, and Clot, left iliac crest - MARKED INVOLVEMENT BY CHRONIC LYMPHOCYTIC LEUKEMIA/SMALL LYMPHOCYTIC LYMPHOMA. PERIPHERAL BLOOD: - CHRONIC LYMPHOCYTIC LEUKEMIA. - THROMBOCYTOPENIA.   08/11/2016 Pathology Results   Bone Marrow Flow Cytometry - CHRONIC LYMPHOCYTIC LEUKEMIA/SMALL LYMPHOCYTIC LYMPHOMA.   08/16/2016 Pathology Results   Normal cytogenetics.  Molecular cytogenetic analysis by [facility] demonstrates a 13q-   08/19/2016 -  Chemotherapy   Imbruvica 420 mg  daily    03/11/2020 - 07/29/2020 Chemotherapy   Patient is on Treatment Plan : CLL Rituximab q28d        INTERVAL HISTORY:   Andrew [name] is a 82 y.o. male presenting to clinic today for follow up of CLL. He was last seen by me on 05/02/22.  Today, he states that he is doing well overall. His appetite level is at 100%. His energy level is at 70%.  PAST MEDICAL HISTORY:   Past Medical History: Past Medical History:  Diagnosis Date   Arthritis    Ascending aortic aneurysm (HCC)    CKD (chronic kidney disease), stage IV (HCC)    followed by [facility]   CLL (chronic lymphocytic leukemia) (HCC)    Complication of anesthesia    "wild when I woke up "   History of kidney stones    passed    Hypertension    NICM (nonischemic cardiomyopathy) (HCC)    a. cath 2007, EF 35%, minimal CAD. b. EF normal 2018.   PAF (paroxysmal atrial fibrillation) (HCC)    Pinched nerve in neck    Pneumonia    SDH (subdural hematoma) (HCC)    Thrombocytopenia (HCC)     Surgical History: Past Surgical History:  Procedure Laterality Date   AV FISTULA PLACEMENT Left 04/30/2020   Procedure: LEFT ARM BRACHIOCEPHALIC ARTERIOVENOUS (AV) FISTULA CREATION;  Surgeon: Nada Libman, MD;  Location: [facility]  OR;  Service: Vascular;  Laterality: Left;   BIOPSY  08/05/2018   Procedure: BIOPSY;  Surgeon: West Bali, MD;  [facility];  Service: Endoscopy;;  gastric    Bone marrow biospy Bilateral    CATARACT EXTRACTION, BILATERAL     COLONOSCOPY  07/10/2011   Procedure: COLONOSCOPY;  Surgeon: Arlyce Harman, MD;  Location: [facility];  Service: Endoscopy;  Laterality: N/A;  10:30 AM   CRANIOTOMY N/A 10/14/2016   Procedure: CRANIOTOMY HEMATOMA EVACUATION SUBDURAL;  Surgeon: Shirlean Kelly, MD;  Location: [facility] OR;  Service: Neurosurgery;  Laterality: N/A;  CRANIOTOMY HEMATOMA EVACUATION SUBDURAL   ESOPHAGOGASTRODUODENOSCOPY N/A 08/05/2018   Procedure: ESOPHAGOGASTRODUODENOSCOPY (EGD);  Surgeon: West Bali, MD;  Location: [facility];  Service: Endoscopy;  Laterality: N/A;  3:00pm   THYROIDECTOMY, PARTIAL     TONSILLECTOMY      Social History: Social History   Socioeconomic History   Marital status: Married    Spouse name: Not on file   Number of children: Not on file   Years of education: Not on file   Highest education level: Not on file  Occupational History   Not on file  Tobacco Use   Smoking status: Former    Packs/day: 1.00    Years: 15.00    Additional pack years: 0.00    Total pack years: 15.00    Types: Cigarettes    Quit date: 01/26/1971    Years since quitting: 51.6   Smokeless tobacco: Never  Vaping Use   Vaping Use: Never used  Substance and Sexual Activity   Alcohol use: No   Drug use: No   Sexual activity: Not on file  Other Topics Concern   Not on file  Social History Narrative   Not on file   Social Determinants of Health   Financial Resource Strain: Low Risk  (08/30/2020)   Overall Financial Resource Strain (CARDIA)    Difficulty of Paying Living Expenses: Not hard at all  Food Insecurity: No Food Insecurity (08/30/2020)   Hunger Vital Sign    Worried About Running Out of Food in the Last Year: Never true    Ran Out of Food in the Last Year: Never true  Transportation Needs: No Transportation Needs (08/30/2020)   PRAPARE - Administrator, Civil Service (Medical): No    Lack of Transportation (Non-Medical): No  Physical Activity: Inactive (08/30/2020)   Exercise Vital Sign    Days of Exercise per Week: 0 days    Minutes of Exercise per Session: 0 min  Stress: No Stress Concern Present (08/30/2020)   [employer] of Occupational Health - Occupational Stress Questionnaire    Feeling of Stress : Not at all  Social Connections: Moderately Integrated (08/30/2020)   Social Connection and Isolation Panel [NHANES]    Frequency of Communication with Friends and Family: More than three times a week    Frequency of Social Gatherings with Friends  and Family: More than three times a week    Attends Religious Services: More than 4 times per year    Active Member of [employer] or Organizations: No    Attends Banker Meetings: Never    Marital Status: Married  Catering manager Violence: Not At Risk (08/30/2020)   Humiliation, Afraid, Rape, and Kick questionnaire    Fear of Current or Ex-Partner: No    Emotionally Abused: No    Physically Abused: No    Sexually Abused: No  Family History: Family History  Problem Relation Age of Onset   CAD Father 67   Cancer Mother    Alzheimer's disease Mother    Colon cancer Neg Hx     Current Medications:  Current Outpatient Medications:    amLODipine (NORVASC) 5 MG tablet, Take 5 mg by mouth daily., Disp: , Rfl:    carvedilol (COREG) 6.25 MG tablet, Take 1 tablet (6.25 mg total) by mouth 2 (two) times daily., Disp: 180 tablet, Rfl: 3   Cholecalciferol (VITAMIN D3) 25 MCG (1000 UT) CAPS, Take 1 capsule by mouth daily., Disp: , Rfl:    Cyanocobalamin (VITAMIN B12) 1000 MCG TBCR, Take 1,000 mcg by mouth daily. , Disp: , Rfl:    ferrous sulfate 325 (65 FE) MG EC tablet, Take 325 mg by mouth 3 (three) times a week., Disp: , Rfl:    Multiple Vitamin (MULITIVITAMIN WITH MINERALS) TABS, Take 1 tablet by mouth daily., Disp: , Rfl:    omeprazole (PRILOSEC) 20 MG capsule, 1 PO 30 MINS PRIOR TO BREAKFAST. (Patient taking differently: Take 20 mg by mouth daily.), Disp: 90 capsule, Rfl: 3   sodium bicarbonate 325 MG tablet, Take 325 mg by mouth 3 (three) times daily., Disp: , Rfl:    tamsulosin (FLOMAX) 0.4 MG CAPS capsule, Take 0.4 mg by mouth daily. , Disp: , Rfl:    venetoclax (VENCLEXTA) 100 MG tablet, Take 3 tablets (300 mg total) by mouth daily., Disp: 84 tablet, Rfl: 3   Allergies: No Known Allergies  REVIEW OF SYSTEMS:   Review of Systems  Constitutional:  Negative for chills, fatigue and fever.  HENT:   Negative for lump/mass, mouth sores, nosebleeds, sore throat and trouble  swallowing.   Eyes:  Negative for eye problems.  Respiratory:  Positive for shortness of breath (On exertion). Negative for cough.   Cardiovascular:  Negative for chest pain, leg swelling and palpitations.  Gastrointestinal:  Negative for abdominal pain, constipation, diarrhea, nausea and vomiting.  Genitourinary:  Negative for bladder incontinence, difficulty urinating, dysuria, frequency, hematuria and nocturia.   Musculoskeletal:  Negative for arthralgias, back pain, flank pain, myalgias and neck pain.  Skin:  Negative for itching and rash.  Neurological:  Positive for numbness. Negative for dizziness and headaches.  Hematological:  Does not bruise/bleed easily.  Psychiatric/Behavioral:  Negative for depression, sleep disturbance and suicidal ideas. The patient is not nervous/anxious.   All other systems reviewed and are negative.    VITALS:   Blood pressure (!) 132/90, pulse 69, temperature 97.8 F (36.6 C), temperature source Oral, resp. rate 16, weight 233 lb 14.4 oz (106.1 kg), SpO2 97 %.  Wt Readings from Last 3 Encounters:  09/07/22 233 lb 14.4 oz (106.1 kg)  05/02/22 235 lb 6.4 oz (106.8 kg)  12/27/21 227 lb 14.4 oz (103.4 kg)    Body mass index is 30.03 kg/m.  Performance status (ECOG): 1 - Symptomatic but completely ambulatory  PHYSICAL EXAM:   Physical Exam Vitals and nursing note reviewed. Exam conducted with a chaperone present.  Constitutional:      Appearance: Normal appearance.  Cardiovascular:     Rate and Rhythm: Normal rate and regular rhythm.     Pulses: Normal pulses.     Heart sounds: Normal heart sounds.  Pulmonary:     Effort: Pulmonary effort is normal.     Breath sounds: Normal breath sounds.  Abdominal:     Palpations: Abdomen is soft. There is no hepatomegaly, splenomegaly or mass.  Tenderness: There is no abdominal tenderness.  Musculoskeletal:     Right lower leg: No edema.     Left lower leg: No edema.  Lymphadenopathy:     Cervical:  No cervical adenopathy.     Right cervical: No superficial, deep or posterior cervical adenopathy.    Left cervical: No superficial, deep or posterior cervical adenopathy.     Upper Body:     Right upper body: No supraclavicular or axillary adenopathy.     Left upper body: No supraclavicular or axillary adenopathy.  Neurological:     General: No focal deficit present.     Mental Status: He is alert and oriented to person, place, and time.  Psychiatric:        Mood and Affect: Mood normal.        Behavior: Behavior normal.     LABS:      Latest Ref Rng & Units 04/25/2022    8:09 AM 12/20/2021    8:07 AM 09/12/2021   11:20 AM  CBC  WBC 4.0 - 10.5 K/uL 2.9  3.1  3.0   Hemoglobin 13.0 - 17.0 g/dL 16.112.7  09.612.8  04.511.7   Hematocrit 39.0 - 52.0 % 36.8  37.1  35.2   Platelets 150 - 400 K/uL 84  85  85       Latest Ref Rng & Units 04/25/2022    8:09 AM 12/20/2021    8:07 AM 09/12/2021   11:20 AM  CMP  Glucose 70 - 99 mg/dL 409105  811105  88   BUN 8 - 23 mg/dL 42  43  44   Creatinine 0.61 - 1.24 mg/dL 9.143.36  7.823.46  9.563.36   Sodium 135 - 145 mmol/L 139  141  139   Potassium 3.5 - 5.1 mmol/L 4.6  4.4  4.3   Chloride 98 - 111 mmol/L 110  109  110   CO2 22 - 32 mmol/L 24  25  24    Calcium 8.9 - 10.3 mg/dL 8.8  9.0  8.7   Total Protein 6.5 - 8.1 g/dL 6.0  6.6  6.2   Total Bilirubin 0.3 - 1.2 mg/dL 0.9  0.9  0.9   Alkaline Phos 38 - 126 U/L 65  56  54   AST 15 - 41 U/L 17  15  19    ALT 0 - 44 U/L 15  14  15       No results found for: "CEA1", "CEA" / No results found for: "CEA1", "CEA" No results found for: "PSA1" No results found for: "CAN199" No results found for: "CAN125"  Lab Results  Component Value Date   TOTALPROTELP 6.1 11/04/2015   ALBUMINELP 4.1 11/04/2015   A1GS 0.2 11/04/2015   A2GS 0.5 11/04/2015   BETS 0.8 11/04/2015   GAMS 0.4 11/04/2015   MSPIKE Not Observed 11/04/2015   SPEI Comment 11/04/2015   Lab Results  Component Value Date   TIBC 315 08/31/2022   TIBC 319  04/25/2022   TIBC 322 12/20/2021   FERRITIN 86 08/31/2022   FERRITIN 91 04/25/2022   FERRITIN 102 12/20/2021   IRONPCTSAT 30 08/31/2022   IRONPCTSAT 32 04/25/2022   IRONPCTSAT 23 12/20/2021   Lab Results  Component Value Date   LDH 142 08/31/2022   LDH 135 04/25/2022   LDH 134 12/20/2021     STUDIES:   No results found.

## 2022-09-07 ENCOUNTER — Other Ambulatory Visit: Payer: Self-pay | Admitting: *Deleted

## 2022-09-07 ENCOUNTER — Inpatient Hospital Stay: Payer: Medicare HMO | Admitting: Hematology

## 2022-09-07 VITALS — BP 132/90 | HR 69 | Temp 97.8°F | Resp 16 | Wt 233.9 lb

## 2022-09-07 DIAGNOSIS — C9110 Chronic lymphocytic leukemia of B-cell type not having achieved remission: Secondary | ICD-10-CM

## 2022-09-07 DIAGNOSIS — N185 Chronic kidney disease, stage 5: Secondary | ICD-10-CM

## 2022-09-07 DIAGNOSIS — N189 Chronic kidney disease, unspecified: Secondary | ICD-10-CM | POA: Diagnosis not present

## 2022-09-07 DIAGNOSIS — D509 Iron deficiency anemia, unspecified: Secondary | ICD-10-CM

## 2022-09-07 DIAGNOSIS — I129 Hypertensive chronic kidney disease with stage 1 through stage 4 chronic kidney disease, or unspecified chronic kidney disease: Secondary | ICD-10-CM | POA: Diagnosis not present

## 2022-09-07 DIAGNOSIS — Z87891 Personal history of nicotine dependence: Secondary | ICD-10-CM | POA: Diagnosis not present

## 2022-09-07 DIAGNOSIS — D649 Anemia, unspecified: Secondary | ICD-10-CM | POA: Diagnosis not present

## 2022-09-07 NOTE — Patient Instructions (Addendum)
[facility] at [facility] Discharge Instructions   You were seen and examined today by Dr. Ellin Saba.  He reviewed the results of your lab work which are normal/stable.   Continue iron tablets three times a week.   We will see you back in 4 months. We will repeat lab work prior to your next visit.    Thank you for choosing [facility] at [facility] to provide your oncology and hematology care.  To afford each patient quality time with our provider, please arrive at least 15 minutes before your scheduled appointment time.   If you have a lab appointment with the Cancer Center please come in thru the Main Entrance and check in at the main information desk.  You need to re-schedule your appointment should you arrive 10 or more minutes late.  We strive to give you quality time with our providers, and arriving late affects you and other patients whose appointments are after yours.  Also, if you no show three or more times for appointments you may be dismissed from the clinic at the providers discretion.     Again, thank you for choosing [facility].  Our hope is that these requests will decrease the amount of time that you wait before being seen by our physicians.       _____________________________________________________________  Should you have questions after your visit to [facility], please contact our office at [phone] and follow the prompts.  Our office hours are 8:00 a.m. and 4:30 p.m. Monday - Friday.  Please note that voicemails left after 4:00 p.m. may not be returned until the following business day.  We are closed weekends and major holidays.  You do have access to a nurse 24-7, just call the main number to the clinic [phone] and do not press any options, hold on the line and a nurse will answer the phone.    For prescription refill requests, have your pharmacy contact our office and allow 72 hours.     Due to Covid, you will need to wear a mask upon entering the hospital. If you do not have a mask, a mask will be given to you at the Main Entrance upon arrival. For doctor visits, patients may have 1 support person age 85 or older with them. For treatment visits, patients can not have anyone with them due to social distancing guidelines and our immunocompromised population.

## 2022-09-12 DIAGNOSIS — N184 Chronic kidney disease, stage 4 (severe): Secondary | ICD-10-CM | POA: Diagnosis not present

## 2022-09-12 DIAGNOSIS — N200 Calculus of kidney: Secondary | ICD-10-CM | POA: Diagnosis not present

## 2022-09-12 DIAGNOSIS — I129 Hypertensive chronic kidney disease with stage 1 through stage 4 chronic kidney disease, or unspecified chronic kidney disease: Secondary | ICD-10-CM | POA: Diagnosis not present

## 2022-09-12 DIAGNOSIS — I770 Arteriovenous fistula, acquired: Secondary | ICD-10-CM | POA: Diagnosis not present

## 2022-09-12 DIAGNOSIS — N12 Tubulo-interstitial nephritis, not specified as acute or chronic: Secondary | ICD-10-CM | POA: Diagnosis not present

## 2022-09-21 ENCOUNTER — Encounter: Payer: Self-pay | Admitting: Internal Medicine

## 2022-10-03 ENCOUNTER — Other Ambulatory Visit (HOSPITAL_COMMUNITY): Payer: Self-pay

## 2022-10-06 ENCOUNTER — Other Ambulatory Visit: Payer: Self-pay

## 2022-10-20 DIAGNOSIS — C44212 Basal cell carcinoma of skin of right ear and external auricular canal: Secondary | ICD-10-CM | POA: Diagnosis not present

## 2022-10-20 DIAGNOSIS — C44329 Squamous cell carcinoma of skin of other parts of face: Secondary | ICD-10-CM | POA: Diagnosis not present

## 2022-10-31 ENCOUNTER — Other Ambulatory Visit (HOSPITAL_COMMUNITY): Payer: Self-pay

## 2022-11-01 ENCOUNTER — Other Ambulatory Visit (HOSPITAL_COMMUNITY): Payer: Self-pay

## 2022-11-02 ENCOUNTER — Ambulatory Visit: Payer: Medicare HMO | Attending: Student | Admitting: Student

## 2022-11-02 ENCOUNTER — Encounter: Payer: Self-pay | Admitting: Student

## 2022-11-02 VITALS — BP 112/60 | HR 77 | Ht 74.0 in | Wt 232.4 lb

## 2022-11-02 DIAGNOSIS — S065XAA Traumatic subdural hemorrhage with loss of consciousness status unknown, initial encounter: Secondary | ICD-10-CM

## 2022-11-02 DIAGNOSIS — I10 Essential (primary) hypertension: Secondary | ICD-10-CM | POA: Diagnosis not present

## 2022-11-02 DIAGNOSIS — I4821 Permanent atrial fibrillation: Secondary | ICD-10-CM | POA: Diagnosis not present

## 2022-11-02 DIAGNOSIS — Z8679 Personal history of other diseases of the circulatory system: Secondary | ICD-10-CM | POA: Diagnosis not present

## 2022-11-02 DIAGNOSIS — C9110 Chronic lymphocytic leukemia of B-cell type not having achieved remission: Secondary | ICD-10-CM

## 2022-11-02 DIAGNOSIS — N184 Chronic kidney disease, stage 4 (severe): Secondary | ICD-10-CM

## 2022-11-02 NOTE — Progress Notes (Signed)
Cardiology Office Note    Date:  11/02/2022  ID:  Andrew [name], DOB [birth year], MRN [id number] Cardiologist: Dina Rich, MD    History of Present Illness:    Andrew [name] is a 82 y.o. 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  He was examined by Terrilee Croak, PA in 10/2021 and reported having chronic dyspnea with activity but this had been unchanged and he was walking 1 to 2 miles at least 5 days/week. His heart rate was well-controlled and he was continued on Coreg for rate-control and was not on anticoagulation for reasons mentioned above. No changes were made to his medications at that time and he was informed to follow-up in 1 year.  In talking with the patient today, he reports things have overall been stable since his last office visit. He continues to walk 2 miles a day, at least 4 to 5 days each week and reports having stable dyspnea with this but no acute changes. He has not walked for the past 2 weeks as his dog recently had surgery. He denies any recent chest pain or palpitations. No recent orthopnea, PND or pitting edema. He does not check his vitals routinely at home but BP is well-controlled at 112/60 during today's visit.  Studies Reviewed:   EKG: EKG is ordered today and demonstrates rate-controlled atrial fibrillation, heart rate 70 with PVC's, RAD and incomplete RBBB.  Echocardiogram: 09/2016 Study Conclusions   - Left ventricle: The cavity size was normal. There was mild    concentric hypertrophy. Systolic function was normal. The    estimated  ejection fraction was in the range of 55% to 60%. Wall    motion was normal; there were no regional wall motion    abnormalities.  - Aortic valve: There was trivial regurgitation.  - Left atrium: The atrium was mildly dilated.   Risk Assessment/Calculations:    CHA2DS2-VASc Score = 4   This indicates a 4.8% annual risk of stroke. The patient's score is based upon: CHF History: 0 HTN History: 1 Diabetes History: 0 Stroke History: 0 Vascular Disease History: 1 Age Score: 2 Gender Score: 0    Physical Exam:   VS:  BP 112/60   Pulse 77   Ht 6\' 2"  (1.88 m)   Wt 232 lb 6.4 oz (105.4 kg)   SpO2 97%   BMI 29.84 kg/m    Wt Readings from Last 3 Encounters:  11/02/22 232 lb 6.4 oz (105.4 kg)  09/07/22 233 lb 14.4 oz (106.1 kg)  05/02/22 235 lb 6.4 oz (106.8 kg)     GEN: Pleasant male appearing in no acute distress NECK: No JVD; No carotid bruits CARDIAC: Irregular irregular, no murmurs, rubs, gallops RESPIRATORY:  Clear to auscultation without rales, wheezing or rhonchi  ABDOMEN: Appears non-distended. No obvious abdominal masses. EXTREMITIES: No clubbing or cyanosis. No pitting edema.  Distal pedal pulses are 2+ bilaterally.   Assessment and Plan:   1. Permanent Atrial Fibrillation - He denies any recent palpitations and his heart rate is well-controlled in the 60's to 70's today.  Remains on Coreg 6.25 mg twice daily for rate-control. -  He is not on anticoagulation given his history of subdural hematoma and also due to chronic thrombocytopenia. Not an ideal Watchman candidate given inability to tolerate short-term anticoagulation.   2. HFimpEF - His ejection fraction was previously at 35% in the past with prior catheterization showing nonobstructive disease. EF was at 55 to 60% by echocardiogram in 2018.  - He has baseline dyspnea on exertion but denies any orthopnea, PND or pitting edema.  Appears euvolemic by examination today. Will plan for a follow-up echocardiogram  prior to his next visit for reassessment of his EF given the timeframe since last evaluation.  3. History of Subdural Hematoma - He has not been on anticoagulation given this along with his known chronic thrombocytopenia.  4. HTN - His blood pressure is well-controlled at 112/60 during today's visit. Continue current medical therapy with Amlodipine 5 mg daily and Coreg 6.25 mg twice daily.  5. CLL/Chronic Thrombocytopenia - Followed by Dr. Ellin Saba. He denies any evidence of active bleeding. Platelet count was at 75 K in 07/2022. No reports of active bleeding.  6. Stage 4-5 CKD - Followed by [facility]. Creatinine was at 3.02 in 07/2022 which is close to his known baseline.   Signed, Ellsworth Lennox, PA-C

## 2022-11-02 NOTE — Patient Instructions (Signed)
Medication Instructions:  Your physician recommends that you continue on your current medications as directed. Please refer to the Current Medication list given to you today.  *If you need a refill on your cardiac medications before your next appointment, please call your pharmacy*   Lab Work: NONE   If you have labs (blood work) drawn today and your tests are completely normal, you will receive your results only by: MyChart Message (if you have MyChart) OR A paper copy in the mail If you have any lab test that is abnormal or we need to change your treatment, we will call you to review the results.   Testing/Procedures: Your physician has requested that you have an echocardiogram. Echocardiography is a painless test that uses sound waves to create images of your heart. It provides your doctor with information about the size and shape of your heart and how well your heart's chambers and valves are working. This procedure takes approximately one hour. There are no restrictions for this procedure. Please do NOT wear cologne, perfume, aftershave, or lotions (deodorant is allowed). Please arrive 15 minutes prior to your appointment time.    Follow-Up: At [facility], you and your health needs are our priority.  As part of our continuing mission to provide you with exceptional heart care, we have created designated Provider Care Teams.  These Care Teams include your primary Cardiologist (physician) and Advanced Practice Providers (APPs -  Physician Assistants and Nurse Practitioners) who all work together to provide you with the care you need, when you need it.  We recommend signing up for the patient portal called "MyChart".  Sign up information is provided on this After Visit Summary.  MyChart is used to connect with patients for Virtual Visits (Telemedicine).  Patients are able to view lab/test results, encounter notes, upcoming appointments, etc.  Non-urgent messages can be sent to  your provider as well.   To learn more about what you can do with MyChart, go to [email or web address].    Your next appointment:   1 year(s)  Provider:   Jonathan Branch, MD    Other Instructions Thank you for choosing [facility]!    

## 2022-11-08 ENCOUNTER — Other Ambulatory Visit: Payer: Self-pay

## 2022-11-28 DIAGNOSIS — H11431 Conjunctival hyperemia, right eye: Secondary | ICD-10-CM | POA: Diagnosis not present

## 2022-11-28 DIAGNOSIS — S00211A Abrasion of right eyelid and periocular area, initial encounter: Secondary | ICD-10-CM | POA: Diagnosis not present

## 2022-11-29 ENCOUNTER — Other Ambulatory Visit (HOSPITAL_COMMUNITY): Payer: Self-pay

## 2022-12-04 ENCOUNTER — Other Ambulatory Visit (HOSPITAL_COMMUNITY): Payer: Self-pay

## 2022-12-08 DIAGNOSIS — J189 Pneumonia, unspecified organism: Secondary | ICD-10-CM | POA: Diagnosis not present

## 2022-12-14 ENCOUNTER — Other Ambulatory Visit: Payer: Self-pay

## 2022-12-15 DIAGNOSIS — J189 Pneumonia, unspecified organism: Secondary | ICD-10-CM | POA: Diagnosis not present

## 2022-12-22 DIAGNOSIS — Z08 Encounter for follow-up examination after completed treatment for malignant neoplasm: Secondary | ICD-10-CM | POA: Diagnosis not present

## 2022-12-22 DIAGNOSIS — Z85828 Personal history of other malignant neoplasm of skin: Secondary | ICD-10-CM | POA: Diagnosis not present

## 2022-12-26 ENCOUNTER — Other Ambulatory Visit (HOSPITAL_COMMUNITY): Payer: Self-pay

## 2022-12-26 ENCOUNTER — Other Ambulatory Visit: Payer: Self-pay | Admitting: Hematology

## 2022-12-26 ENCOUNTER — Other Ambulatory Visit: Payer: Self-pay

## 2022-12-26 DIAGNOSIS — C9110 Chronic lymphocytic leukemia of B-cell type not having achieved remission: Secondary | ICD-10-CM

## 2022-12-26 MED ORDER — VENETOCLAX 100 MG PO TABS
300.0000 mg | ORAL_TABLET | Freq: Every day | ORAL | 3 refills | Status: DC
Start: 2022-12-26 — End: 2023-04-20
  Filled 2022-12-26: qty 84, 28d supply, fill #0
  Filled 2023-01-23: qty 84, 28d supply, fill #1
  Filled 2023-02-14: qty 84, 28d supply, fill #2
  Filled 2023-03-23: qty 84, 28d supply, fill #3

## 2022-12-28 DIAGNOSIS — R0989 Other specified symptoms and signs involving the circulatory and respiratory systems: Secondary | ICD-10-CM | POA: Diagnosis not present

## 2022-12-28 DIAGNOSIS — R059 Cough, unspecified: Secondary | ICD-10-CM | POA: Diagnosis not present

## 2023-01-01 ENCOUNTER — Other Ambulatory Visit (HOSPITAL_COMMUNITY): Payer: Self-pay

## 2023-01-09 ENCOUNTER — Inpatient Hospital Stay: Payer: Medicare HMO | Attending: Hematology

## 2023-01-09 ENCOUNTER — Ambulatory Visit (HOSPITAL_COMMUNITY)
Admission: RE | Admit: 2023-01-09 | Discharge: 2023-01-09 | Disposition: A | Discharge status: Home / Self Care | Payer: Medicare HMO | Source: Ambulatory Visit | Attending: Hematology | Admitting: Hematology

## 2023-01-09 DIAGNOSIS — D509 Iron deficiency anemia, unspecified: Secondary | ICD-10-CM

## 2023-01-09 DIAGNOSIS — D649 Anemia, unspecified: Secondary | ICD-10-CM | POA: Insufficient documentation

## 2023-01-09 DIAGNOSIS — I700 Atherosclerosis of aorta: Secondary | ICD-10-CM | POA: Diagnosis not present

## 2023-01-09 DIAGNOSIS — N400 Enlarged prostate without lower urinary tract symptoms: Secondary | ICD-10-CM | POA: Diagnosis not present

## 2023-01-09 DIAGNOSIS — Z87891 Personal history of nicotine dependence: Secondary | ICD-10-CM | POA: Insufficient documentation

## 2023-01-09 DIAGNOSIS — C9110 Chronic lymphocytic leukemia of B-cell type not having achieved remission: Secondary | ICD-10-CM | POA: Insufficient documentation

## 2023-01-09 DIAGNOSIS — Z9221 Personal history of antineoplastic chemotherapy: Secondary | ICD-10-CM | POA: Insufficient documentation

## 2023-01-09 DIAGNOSIS — N184 Chronic kidney disease, stage 4 (severe): Secondary | ICD-10-CM | POA: Insufficient documentation

## 2023-01-09 DIAGNOSIS — N185 Chronic kidney disease, stage 5: Secondary | ICD-10-CM

## 2023-01-09 LAB — COMPREHENSIVE METABOLIC PANEL
ALT: 15 U/L (ref 0–44)
AST: 17 U/L (ref 15–41)
Albumin: 3.9 g/dL (ref 3.5–5.0)
Alkaline Phosphatase: 69 U/L (ref 38–126)
Anion gap: 8 (ref 5–15)
BUN: 34 mg/dL — ABNORMAL HIGH (ref 8–23)
CO2: 24 mmol/L (ref 22–32)
Calcium: 9 mg/dL (ref 8.9–10.3)
Chloride: 105 mmol/L (ref 98–111)
Creatinine, Ser: 3.16 mg/dL — ABNORMAL HIGH (ref 0.61–1.24)
GFR, Estimated: 19 mL/min — ABNORMAL LOW (ref >60)
Glucose, Bld: 106 mg/dL — ABNORMAL HIGH (ref 70–99)
Potassium: 4.1 mmol/L (ref 3.5–5.1)
Sodium: 137 mmol/L (ref 135–145)
Total Bilirubin: 0.9 mg/dL (ref 0.3–1.2)
Total Protein: 6.2 g/dL — ABNORMAL LOW (ref 6.5–8.1)

## 2023-01-09 LAB — CBC WITH DIFFERENTIAL/PLATELET
Abs Immature Granulocytes: 0.01 10*3/uL (ref 0.00–0.07)
Basophils Absolute: 0 10*3/uL (ref 0.0–0.1)
Basophils Relative: 0 %
Eosinophils Absolute: 0 10*3/uL (ref 0.0–0.5)
Eosinophils Relative: 0 %
HCT: 36.3 % — ABNORMAL LOW (ref 39.0–52.0)
Hemoglobin: 12.6 g/dL — ABNORMAL LOW (ref 13.0–17.0)
Immature Granulocytes: 0 %
Lymphocytes Relative: 16 %
Lymphs Abs: 0.4 10*3/uL — ABNORMAL LOW (ref 0.7–4.0)
MCH: 35.8 pg — ABNORMAL HIGH (ref 26.0–34.0)
MCHC: 34.7 g/dL (ref 30.0–36.0)
MCV: 103.1 fL — ABNORMAL HIGH (ref 80.0–100.0)
Monocytes Absolute: 0.4 10*3/uL (ref 0.1–1.0)
Monocytes Relative: 17 %
Neutro Abs: 1.6 10*3/uL — ABNORMAL LOW (ref 1.7–7.7)
Neutrophils Relative %: 67 %
Platelets: 96 10*3/uL — ABNORMAL LOW (ref 150–400)
RBC: 3.52 MIL/uL — ABNORMAL LOW (ref 4.22–5.81)
RDW: 13.7 % (ref 11.5–15.5)
WBC: 2.4 10*3/uL — ABNORMAL LOW (ref 4.0–10.5)
nRBC: 0 % (ref 0.0–0.2)

## 2023-01-09 LAB — FERRITIN: Ferritin: 150 ng/mL (ref 24–336)

## 2023-01-09 LAB — IRON AND TIBC
Iron: 85 ug/dL (ref 45–182)
Saturation Ratios: 26 % (ref 17.9–39.5)
TIBC: 324 ug/dL (ref 250–450)
UIBC: 239 ug/dL

## 2023-01-09 LAB — LACTATE DEHYDROGENASE: LDH: 149 U/L (ref 98–192)

## 2023-01-09 MED ORDER — IOHEXOL 9 MG/ML PO SOLN
ORAL | Status: AC
  Filled 2023-01-09: qty 1000

## 2023-01-17 NOTE — Progress Notes (Signed)
[facility] [address] [place] [address]    Clinic Day:  01/17/2023  Referring physician: Benita Stabile, MD  Patient Care Team: Benita Stabile, MD as PCP - General (Internal Medicine) Wyline Mood Dorothe Pea, MD as PCP - Cardiology (Cardiology) Lanelle Bal, DO as Consulting Physician (Internal Medicine) Doreatha Massed, MD as Consulting Physician (Hematology)   ASSESSMENT & PLAN:   Assessment: 1.  CLL, Rai stage IV: -BMBX on 08/10/2016 shows bone marrow involvement by CLL. -He also had bulky diffuse adenopathy with progressive thrombocytopenia. -Imbruvica 420 mg from 08/19/2016 through May 2018, discontinued due to subdural hematoma on 10/14/2016. -Subsequently offered chlorambucil and obinutuzumab.  He refused obinutuzumab.  Chlorambucil 30 mg every 2 weeks from June 2019 through 11/07/2018 due to severe weakness. -PET scan on 12/01/2019 showed multiple bilateral enlarged cervical lymph nodes, bilateral axillary and mediastinal adenopathy, prevascular lymph nodes, left lower paratracheal lymph node, minimal adenopathy in the retroperitoneum, external iliac lymph nodes.  Spleen measures 16 cm.  All lymph nodes measure less than 5 cm with SUV less than 5. 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 -T p53 mutation is pending. -Venetoclax 10 mg started on 12/02/2019 (dose reduced secondary to Coreg), dose increased to 50 mg daily on 12/12/2019. -Dose increased to 100 mg daily on 12/19/2019, held on 12/25/2019 due to elevated creatinine. -Venetoclax 100 mg daily started back on 01/15/2020.  Dose increased to 200 mg daily on 07/01/2020.  Dose increased to 300 mg daily on 09/29/2020. - Rituximab 6 cycles completed on 07/29/2020. - CT CAP on 12/16/2020 showed complete resolution of lymphadenopathy in the chest.  Spleen size is normal.  Subcentimeter lymphadenopathy in the left external iliac region and left inguinal region.   2.  CKD: -Kidney biopsy on 12/19/2017  shows CLL involvement with diffuse severe interstitial nephritis and associated tubular centric granulomatous reaction.   Plan: 1.  CLL, Rai stage IV: - CT CAP on 12/20/2021: Stable subcentimeter pelvic lymph nodes with no pathologically enlarged lymph nodes above or below the diaphragm with no splenomegaly.  Nonobstructive renal calculi bilateral less than 5 mm.  Left-sided colonic diverticulosis. - He is tolerating venetoclax very well. - Physical exam today: No palpable adenopathy or splenomegaly. - No recent infections noted. - Labs from 08/22/2022: WBC-2.3, ANC-1.5, ALC-0.3, PLT-75, Hb-12.5. - Recommend continuing venetoclax 300 mg daily.  RTC 4 months for follow-up.  Plan to repeat CT CAP without contrast prior to next visit along with labs.   2.  CKD: - Creatinine on 08/22/2022 was 3.05, down from 3.36 in November 2023. - Continue follow-up with Dr. Arrie Aran.   3.  Normocytic anemia: - He is tolerating iron tablet 3 times weekly very well. - Ferritin is 86, down from 91.  Percent saturation is 30.  Hemoglobin is stable at 12.5. - Continue iron tablet 3 times weekly.  If there is any worsening, will give parenteral iron therapy.  No orders of the defined types were placed in this encounter.     Andrew [name],acting as a Neurosurgeon for Doreatha Massed, MD.,have documented all relevant documentation on the behalf of Doreatha Massed, MD,as directed by  Doreatha Massed, MD while in the presence of Doreatha Massed, MD.  ***   Mohall R [name]   8/21/20247:57 PM  CHIEF COMPLAINT:   Diagnosis: CLL    Cancer Staging  CLL (chronic lymphocytic leukemia) (HCC) Staging form: Chronic Lymphocytic Leukemia / Small Lymphocytic Lymphoma, AJCC 8th Edition -  Clinical stage from 09/04/2016: Modified Rai Stage IV (Modified Rai risk: High, Binet: Stage C, Lymphocytosis: Present, Adenopathy: Present, Organomegaly: Present, Anemia: Absent, Thrombocytopenia: Present) - Signed by  Ellouise Newer, PA-C on 09/04/2016    Prior Therapy: none  Current Therapy:  Venetoclax 300 mg daily    HISTORY OF PRESENT ILLNESS:   Oncology History  CLL (chronic lymphocytic leukemia) (HCC)  03/09/2015 Initial Diagnosis   CLL (chronic lymphocytic leukemia) (HCC)   08/03/2016 Imaging   CT neck- 1. Diffuse and bulky lymphadenopathy in keeping with history of CLL. Nodal enlargement has diffusely progressed since April 2016. 2. Chest and abdomen CT reported separately.   08/03/2016 Imaging   CT CAP- 1. Bulky lymphadenopathy in the chest, abdomen, and pelvis as described. 2. Splenomegaly. 3. Coronary artery and thoracoabdominal aortic atherosclerosis. 4. Prostatomegaly. 5. Several tiny lucent lesions in the bony anatomic pelvis, nonspecific, but attention on follow-up imaging recommended.   08/10/2016 Procedure   Bone marrow aspiration and biopsy by IR.   08/11/2016 Pathology Results   Bone Marrow, Aspirate,Biopsy, and Clot, left iliac crest - MARKED INVOLVEMENT BY CHRONIC LYMPHOCYTIC LEUKEMIA/SMALL LYMPHOCYTIC LYMPHOMA. PERIPHERAL BLOOD: - CHRONIC LYMPHOCYTIC LEUKEMIA. - THROMBOCYTOPENIA.   08/11/2016 Pathology Results   Bone Marrow Flow Cytometry - CHRONIC LYMPHOCYTIC LEUKEMIA/SMALL LYMPHOCYTIC LYMPHOMA.   08/16/2016 Pathology Results   Normal cytogenetics.  Molecular cytogenetic analysis by [facility] demonstrates a 13q-   08/19/2016 -  Chemotherapy   Imbruvica 420 mg daily    03/11/2020 - 07/29/2020 Chemotherapy   Patient is on Treatment Plan : CLL Rituximab q28d        INTERVAL HISTORY:   Andrew [name] is a 82 y.o. male presenting to clinic today for follow up of CLL. He was last seen by me on 09/07/22.  Since his last visit, he underwent CT C/A/P on 01/09/23 that found: no evidence of recurrent or metastatic disease in the chest, abdomen or pelvis; rounded 2.9 cm density projecting off of the posterior left kidney is similar over multiple priors slightly more apparent over each  subsequent imaging study in retrospect, related to the degree of increased cortical thinning; nonobstructive bilateral renal stones measure up to 4 mm; and enlarged prostate gland.   Today, he states that he is doing well overall. His appetite level is at ***%. His energy level is at ***%. ** PAST MEDICAL HISTORY:   Past Medical History: Past Medical History:  Diagnosis Date   Arthritis    Ascending aortic aneurysm (HCC)    CKD (chronic kidney disease), stage IV (HCC)    followed by [facility]   CLL (chronic lymphocytic leukemia) (HCC)    Complication of anesthesia    "wild when I woke up "   History of kidney stones    passed    Hypertension    NICM (nonischemic cardiomyopathy) (HCC)    a. cath 2007, EF 35%, minimal CAD. b. EF normal 2018.   PAF (paroxysmal atrial fibrillation) (HCC)    Pinched nerve in neck    Pneumonia    SDH (subdural hematoma) (HCC)    Thrombocytopenia (HCC)     Surgical History: Past Surgical History:  Procedure Laterality Date   AV FISTULA PLACEMENT Left 04/30/2020   Procedure: LEFT ARM BRACHIOCEPHALIC ARTERIOVENOUS (AV) FISTULA CREATION;  Surgeon: Nada Libman, MD;  Location: [facility] OR;  Service: Vascular;  Laterality: Left;   BIOPSY  08/05/2018   Procedure: BIOPSY;  Surgeon: West Bali, MD;  Location: [facility] ENDO SUITE;  Service: Endoscopy;;  gastric  Bone marrow biospy Bilateral    CATARACT EXTRACTION, BILATERAL     COLONOSCOPY  07/10/2011   Procedure: COLONOSCOPY;  Surgeon: Arlyce Harman, MD;  Location: [facility] END[facility] SUITE;  Service: Endoscopy;  Laterality: N/A;  10:30 AM   CRANIOTOMY N/A 10/14/2016   Procedure: CRANIOTOMY HEMATOMA EVACUATION SUBDURAL;  Surgeon: Shirlean Kelly, MD;  Location: [facility] OR;  Service: Neurosurgery;  Laterality: N/A;  CRANIOTOMY HEMATOMA EVACUATION SUBDURAL   ESOPHAGOGASTRODUODENOSCOPY N/A 08/05/2018   Procedure: ESOPHAGOGASTRODUODENOSCOPY (EGD);  Surgeon: West Bali, MD;  Location: [facility];  Service: Endoscopy;   Laterality: N/A;  3:00pm   THYROIDECTOMY, PARTIAL     TONSILLECTOMY      Social History: Social History   Socioeconomic History   Marital status: Married    Spouse name: Not on file   Number of children: Not on file   Years of education: Not on file   Highest education level: Not on file  Occupational History   Not on file  Tobacco Use   Smoking status: Former    Current packs/day: 0.00    Average packs/day: 1 pack/day for 15.0 years (15.0 ttl pk-yrs)    Types: Cigarettes    Start date: 01/26/1956    Quit date: 01/26/1971    Years since quitting: 52.0   Smokeless tobacco: Never  Vaping Use   Vaping status: Never Used  Substance and Sexual Activity   Alcohol use: No   Drug use: No   Sexual activity: Not on file  Other Topics Concern   Not on file  Social History Narrative   Not on file   Social Determinants of Health   Financial Resource Strain: Low Risk  (08/30/2020)   Overall Financial Resource Strain (CARDIA)    Difficulty of Paying Living Expenses: Not hard at all  Food Insecurity: No Food Insecurity (08/30/2020)   Hunger Vital Sign    Worried About Running Out of Food in the Last Year: Never true    Ran Out of Food in the Last Year: Never true  Transportation Needs: No Transportation Needs (08/30/2020)   PRAPARE - Administrator, Civil Service (Medical): No    Lack of Transportation (Non-Medical): No  Physical Activity: Inactive (08/30/2020)   Exercise Vital Sign    Days of Exercise per Week: 0 days    Minutes of Exercise per Session: 0 min  Stress: No Stress Concern Present (08/30/2020)   [employer] of Occupational Health - Occupational Stress Questionnaire    Feeling of Stress : Not at all  Social Connections: Moderately Integrated (08/30/2020)   Social Connection and Isolation Panel [NHANES]    Frequency of Communication with Friends and Family: More than three times a week    Frequency of Social Gatherings with Friends and Family: More than three  times a week    Attends Religious Services: More than 4 times per year    Active Member of [employer] or Organizations: No    Attends Banker Meetings: Never    Marital Status: Married  Catering manager Violence: Not At Risk (08/30/2020)   Humiliation, Afraid, Rape, and Kick questionnaire    Fear of Current or Ex-Partner: No    Emotionally Abused: No    Physically Abused: No    Sexually Abused: No    Family History: Family History  Problem Relation Age of Onset   CAD Father 18   Cancer Mother    Alzheimer's disease Mother    Colon cancer Neg Hx  Current Medications:  Current Outpatient Medications:    amLODipine (NORVASC) 5 MG tablet, Take 5 mg by mouth daily., Disp: , Rfl:    carvedilol (COREG) 6.25 MG tablet, Take 1 tablet (6.25 mg total) by mouth 2 (two) times daily., Disp: 180 tablet, Rfl: 3   Cholecalciferol (VITAMIN D3) 25 MCG (1000 UT) CAPS, Take 1 capsule by mouth daily., Disp: , Rfl:    Cyanocobalamin (VITAMIN B12) 1000 MCG TBCR, Take 1,000 mcg by mouth daily. , Disp: , Rfl:    ferrous sulfate 325 (65 FE) MG EC tablet, Take 325 mg by mouth 3 (three) times a week., Disp: , Rfl:    Multiple Vitamin (MULITIVITAMIN WITH MINERALS) TABS, Take 1 tablet by mouth daily., Disp: , Rfl:    omeprazole (PRILOSEC) 20 MG capsule, 1 PO 30 MINS PRIOR TO BREAKFAST. (Patient taking differently: Take 20 mg by mouth daily.), Disp: 90 capsule, Rfl: 3   sodium bicarbonate 650 MG tablet, Take 650 mg by mouth 3 (three) times daily., Disp: , Rfl:    tamsulosin (FLOMAX) 0.4 MG CAPS capsule, Take 0.4 mg by mouth daily. , Disp: , Rfl:    venetoclax (VENCLEXTA) 100 MG tablet, Take 3 tablets (300 mg total) by mouth daily., Disp: 84 tablet, Rfl: 3   Allergies: No Known Allergies  REVIEW OF SYSTEMS:   Review of Systems  Constitutional:  Negative for chills, fatigue and fever.  HENT:   Negative for lump/mass, mouth sores, nosebleeds, sore throat and trouble swallowing.   Eyes:  Negative  for eye problems.  Respiratory:  Negative for cough and shortness of breath.   Cardiovascular:  Negative for chest pain, leg swelling and palpitations.  Gastrointestinal:  Negative for abdominal pain, constipation, diarrhea, nausea and vomiting.  Genitourinary:  Negative for bladder incontinence, difficulty urinating, dysuria, frequency, hematuria and nocturia.   Musculoskeletal:  Negative for arthralgias, back pain, flank pain, myalgias and neck pain.  Skin:  Negative for itching and rash.  Neurological:  Negative for dizziness, headaches and numbness.  Hematological:  Does not bruise/bleed easily.  Psychiatric/Behavioral:  Negative for depression, sleep disturbance and suicidal ideas. The patient is not nervous/anxious.   All other systems reviewed and are negative.    VITALS:   There were no vitals taken for this visit.  Wt Readings from Last 3 Encounters:  11/02/22 232 lb 6.4 oz (105.4 kg)  09/07/22 233 lb 14.4 oz (106.1 kg)  05/02/22 235 lb 6.4 oz (106.8 kg)    There is no height or weight on file to calculate BMI.  Performance status (ECOG): 1 - Symptomatic but completely ambulatory  PHYSICAL EXAM:   Physical Exam Vitals and nursing note reviewed. Exam conducted with a chaperone present.  Constitutional:      Appearance: Normal appearance.  Cardiovascular:     Rate and Rhythm: Normal rate and regular rhythm.     Pulses: Normal pulses.     Heart sounds: Normal heart sounds.  Pulmonary:     Effort: Pulmonary effort is normal.     Breath sounds: Normal breath sounds.  Abdominal:     Palpations: Abdomen is soft. There is no hepatomegaly, splenomegaly or mass.     Tenderness: There is no abdominal tenderness.  Musculoskeletal:     Right lower leg: No edema.     Left lower leg: No edema.  Lymphadenopathy:     Cervical: No cervical adenopathy.     Right cervical: No superficial, deep or posterior cervical adenopathy.    Left cervical: No  superficial, deep or posterior  cervical adenopathy.     Upper Body:     Right upper body: No supraclavicular or axillary adenopathy.     Left upper body: No supraclavicular or axillary adenopathy.  Neurological:     General: No focal deficit present.     Mental Status: He is alert and oriented to person, place, and time.  Psychiatric:        Mood and Affect: Mood normal.        Behavior: Behavior normal.    LABS:      Latest Ref Rng & Units 01/09/2023    8:50 AM 04/25/2022    8:09 AM 12/20/2021    8:07 AM  CBC  WBC 4.0 - 10.5 K/uL 2.4  2.9  3.1   Hemoglobin 13.0 - 17.0 g/dL 40.9  81.1  91.4   Hematocrit 39.0 - 52.0 % 36.3  36.8  37.1   Platelets 150 - 400 K/uL 96  84  85       Latest Ref Rng & Units 01/09/2023    8:50 AM 04/25/2022    8:09 AM 12/20/2021    8:07 AM  CMP  Glucose 70 - 99 mg/dL 782  956  213   BUN 8 - 23 mg/dL 34  42  43   Creatinine 0.61 - 1.24 mg/dL 0.86  5.78  4.69   Sodium 135 - 145 mmol/L 137  139  141   Potassium 3.5 - 5.1 mmol/L 4.1  4.6  4.4   Chloride 98 - 111 mmol/L 105  110  109   CO2 22 - 32 mmol/L 24  24  25    Calcium 8.9 - 10.3 mg/dL 9.0  8.8  9.0   Total Protein 6.5 - 8.1 g/dL 6.2  6.0  6.6   Total Bilirubin 0.3 - 1.2 mg/dL 0.9  0.9  0.9   Alkaline Phos 38 - 126 U/L 69  65  56   AST 15 - 41 U/L 17  17  15    ALT 0 - 44 U/L 15  15  14       No results found for: "CEA1", "CEA" / No results found for: "CEA1", "CEA" No results found for: "PSA1" No results found for: "CAN199" No results found for: "CAN125"  Lab Results  Component Value Date   TOTALPROTELP 6.1 11/04/2015   ALBUMINELP 4.1 11/04/2015   A1GS 0.2 11/04/2015   A2GS 0.5 11/04/2015   BETS 0.8 11/04/2015   GAMS 0.4 11/04/2015   MSPIKE Not Observed 11/04/2015   SPEI Comment 11/04/2015   Lab Results  Component Value Date   TIBC 324 01/09/2023   TIBC 315 08/31/2022   TIBC 319 04/25/2022   FERRITIN 150 01/09/2023   FERRITIN 86 08/31/2022   FERRITIN 91 04/25/2022   IRONPCTSAT 26 01/09/2023   IRONPCTSAT 30  08/31/2022   IRONPCTSAT 32 04/25/2022   Lab Results  Component Value Date   LDH 149 01/09/2023   LDH 142 08/31/2022   LDH 135 04/25/2022     STUDIES:   CT CHEST ABDOMEN PELVIS WO CONTRAST  Result Date: 01/09/2023 CLINICAL DATA:  Follow-up chronic lymphocytic leukemia. * Tracking Code: BO * EXAM: CT CHEST, ABDOMEN AND PELVIS WITHOUT CONTRAST TECHNIQUE: Multidetector CT imaging of the chest, abdomen and pelvis was performed following the standard protocol without IV contrast. RADIATION DOSE REDUCTION: This exam was performed according to the departmental dose-optimization program which includes automated exposure control, adjustment of the mA and/or kV according to patient size  and/or use of iterative reconstruction technique. COMPARISON:  Multiple priors including most recent CT December 20, 2021 FINDINGS: CT CHEST FINDINGS Cardiovascular: Aortic atherosclerosis. Coronary artery calcifications cardiac enlargement. Trace pericardial effusion. Mediastinum/Nodes: No suspicious thyroid nodule. Prior right thyroidectomy. No pathologically enlarged mediastinal, hilar or axillary lymph nodes. The esophagus is grossly unremarkable Lungs/Pleura: No suspicious pulmonary nodules or masses. Bibasilar atelectasis/scarring. No pleural effusion. No pneumothorax. Musculoskeletal: No aggressive lytic or blastic lesion of bone CT ABDOMEN PELVIS FINDINGS Hepatobiliary: Stable hypodense lesions in the left hepatic lobe likely reflecting cysts gallbladder is unremarkable. No biliary ductal dilation. Pancreas: No pancreatic ductal dilation or evidence of acute inflammation. Spleen: No splenomegaly Adrenals/Urinary Tract: Bilateral adrenal glands are unchanged. Nonobstructive bilateral renal stones measure up to 4 mm no obstructive ureteral or bladder calculi. Rounded 2.9 cm density off of the posterior left kidney on image 74/2 is similar over multiple prior dating back to at least 2021 imaging studies slightly more apparent  related to the degree of increased cortical thinning. Urinary bladder is unremarkable for degree of distension. Stomach/Bowel: Stomach is unremarkable for degree of distension. No pathologic dilation of small or large bowel. Normal appendix no evidence of acute bowel inflammation. Left-sided colonic diverticulosis without findings of acute diverticulitis Vascular/Lymphatic: Aortic atherosclerosis. Normal caliber abdominal aorta. Smooth IVC contours. No pathologically enlarged abdominal or pelvic lymph nodes. Previously indexed left external iliac lymph node measures 8 mm in short axis on image 100/2 previously 9 mm. Reproductive: Enlarged prostate gland. Other: Fat containing umbilical hernia. Musculoskeletal: No aggressive lytic or blastic lesion of bone. Multilevel degenerative changes spine. IMPRESSION: 1. No evidence of recurrent or metastatic disease in the chest, abdomen or pelvis. 2. Rounded 2.9 cm density projecting off of the posterior left kidney is similar over multiple priors dating back to at least 2021 slightly more apparent over each subsequent imaging study in retrospect, related to the degree of increased cortical thinning. Possibly reflecting a nodular area of preserved renal parenchyma but incompletely evaluated on these noncontrast enhanced examination. Recommend further evaluation with renal ultrasound. 3. Nonobstructive bilateral renal stones measure up to 4 mm. 4. Enlarged prostate gland. 5.  Aortic Atherosclerosis (ICD10-I70.0). Electronically Signed   By: Maudry Mayhew M.D.   On: 01/09/2023 16:15

## 2023-01-18 ENCOUNTER — Inpatient Hospital Stay (HOSPITAL_BASED_OUTPATIENT_CLINIC_OR_DEPARTMENT_OTHER): Payer: Medicare HMO | Admitting: Hematology

## 2023-01-18 VITALS — BP 124/78 | HR 80 | Temp 98.2°F | Resp 17 | Ht 74.0 in | Wt 229.4 lb

## 2023-01-18 DIAGNOSIS — C9110 Chronic lymphocytic leukemia of B-cell type not having achieved remission: Secondary | ICD-10-CM | POA: Diagnosis not present

## 2023-01-18 DIAGNOSIS — D509 Iron deficiency anemia, unspecified: Secondary | ICD-10-CM | POA: Diagnosis not present

## 2023-01-18 DIAGNOSIS — Z87891 Personal history of nicotine dependence: Secondary | ICD-10-CM | POA: Diagnosis not present

## 2023-01-18 DIAGNOSIS — N184 Chronic kidney disease, stage 4 (severe): Secondary | ICD-10-CM | POA: Diagnosis not present

## 2023-01-18 DIAGNOSIS — N400 Enlarged prostate without lower urinary tract symptoms: Secondary | ICD-10-CM | POA: Diagnosis not present

## 2023-01-18 DIAGNOSIS — Z9221 Personal history of antineoplastic chemotherapy: Secondary | ICD-10-CM | POA: Diagnosis not present

## 2023-01-18 DIAGNOSIS — D649 Anemia, unspecified: Secondary | ICD-10-CM | POA: Diagnosis not present

## 2023-01-18 NOTE — Progress Notes (Signed)
Patient is taking Venetoclax as prescribed.  He has not missed any doses and reports no side effects at this time.   

## 2023-01-18 NOTE — Patient Instructions (Signed)
[facility]  Discharge Instructions  You were seen and examined today by Dr. Ellin Saba.  Dr. Ellin Saba discussed your most recent lab work and CT scan which are all stable and improving, this is great news.  Continue Venetoclax as prescribed.  Follow-up as scheduled.  Thank you for choosing [facility] to provide your oncology and hematology care.   To afford each patient quality time with our provider, please arrive at least 15 minutes before your scheduled appointment time. You may need to reschedule your appointment if you arrive late (10 or more minutes). Arriving late affects you and other patients whose appointments are after yours.  Also, if you miss three or more appointments without notifying the office, you may be dismissed from the clinic at the provider's discretion.    Again, thank you for choosing [facility].  Our hope is that these requests will decrease the amount of time that you wait before being seen by our physicians.   If you have a lab appointment with the Cancer Center - please note that after April 8th, all labs will be drawn in the cancer center.  You do not have to check in or register with the main entrance as you have in the past but will complete your check-in at the cancer center.            _____________________________________________________________  Should you have questions after your visit to [facility], please contact our office at [phone] and follow the prompts.  Our office hours are 8:00 a.m. to 4:30 p.m. Monday - Thursday and 8:00 a.m. to 2:30 p.m. Friday.  Please note that voicemails left after 4:00 p.m. may not be returned until the following business day.  We are closed weekends and all major holidays.  You do have access to a nurse 24-7, just call the main number to the clinic [phone] and do not press any options, hold on the line and a nurse will answer  the phone.    For prescription refill requests, have your pharmacy contact our office and allow 72 hours.    Masks are no longer required in the cancer centers. If you would like for your care team to wear a mask while they are taking care of you, please let them know. You may have one support person who is at least 83 years old accompany you for your appointments.

## 2023-01-23 ENCOUNTER — Other Ambulatory Visit (HOSPITAL_COMMUNITY): Payer: Self-pay

## 2023-01-23 DIAGNOSIS — I129 Hypertensive chronic kidney disease with stage 1 through stage 4 chronic kidney disease, or unspecified chronic kidney disease: Secondary | ICD-10-CM | POA: Diagnosis not present

## 2023-01-23 DIAGNOSIS — N179 Acute kidney failure, unspecified: Secondary | ICD-10-CM | POA: Diagnosis not present

## 2023-01-23 DIAGNOSIS — Z8616 Personal history of COVID-19: Secondary | ICD-10-CM | POA: Diagnosis not present

## 2023-01-23 DIAGNOSIS — N12 Tubulo-interstitial nephritis, not specified as acute or chronic: Secondary | ICD-10-CM | POA: Diagnosis not present

## 2023-01-23 DIAGNOSIS — N184 Chronic kidney disease, stage 4 (severe): Secondary | ICD-10-CM | POA: Diagnosis not present

## 2023-01-23 DIAGNOSIS — N200 Calculus of kidney: Secondary | ICD-10-CM | POA: Diagnosis not present

## 2023-01-23 DIAGNOSIS — I770 Arteriovenous fistula, acquired: Secondary | ICD-10-CM | POA: Diagnosis not present

## 2023-01-23 DIAGNOSIS — D696 Thrombocytopenia, unspecified: Secondary | ICD-10-CM | POA: Diagnosis not present

## 2023-01-23 DIAGNOSIS — C9110 Chronic lymphocytic leukemia of B-cell type not having achieved remission: Secondary | ICD-10-CM | POA: Diagnosis not present

## 2023-01-26 ENCOUNTER — Other Ambulatory Visit (HOSPITAL_COMMUNITY): Payer: Self-pay

## 2023-02-14 ENCOUNTER — Other Ambulatory Visit: Payer: Self-pay

## 2023-03-01 ENCOUNTER — Encounter: Payer: Self-pay | Admitting: Internal Medicine

## 2023-03-01 DIAGNOSIS — I480 Paroxysmal atrial fibrillation: Secondary | ICD-10-CM | POA: Diagnosis not present

## 2023-03-01 DIAGNOSIS — D649 Anemia, unspecified: Secondary | ICD-10-CM | POA: Diagnosis not present

## 2023-03-01 DIAGNOSIS — I5022 Chronic systolic (congestive) heart failure: Secondary | ICD-10-CM | POA: Diagnosis not present

## 2023-03-01 DIAGNOSIS — Z23 Encounter for immunization: Secondary | ICD-10-CM | POA: Diagnosis not present

## 2023-03-01 DIAGNOSIS — K2270 Barrett's esophagus without dysplasia: Secondary | ICD-10-CM | POA: Diagnosis not present

## 2023-03-01 DIAGNOSIS — D6949 Other primary thrombocytopenia: Secondary | ICD-10-CM | POA: Diagnosis not present

## 2023-03-01 DIAGNOSIS — Z8679 Personal history of other diseases of the circulatory system: Secondary | ICD-10-CM | POA: Diagnosis not present

## 2023-03-01 DIAGNOSIS — E782 Mixed hyperlipidemia: Secondary | ICD-10-CM | POA: Diagnosis not present

## 2023-03-01 DIAGNOSIS — I719 Aortic aneurysm of unspecified site, without rupture: Secondary | ICD-10-CM | POA: Diagnosis not present

## 2023-03-01 DIAGNOSIS — N184 Chronic kidney disease, stage 4 (severe): Secondary | ICD-10-CM | POA: Diagnosis not present

## 2023-03-01 DIAGNOSIS — C9110 Chronic lymphocytic leukemia of B-cell type not having achieved remission: Secondary | ICD-10-CM | POA: Diagnosis not present

## 2023-03-01 DIAGNOSIS — I130 Hypertensive heart and chronic kidney disease with heart failure and stage 1 through stage 4 chronic kidney disease, or unspecified chronic kidney disease: Secondary | ICD-10-CM | POA: Diagnosis not present

## 2023-03-23 ENCOUNTER — Other Ambulatory Visit: Payer: Self-pay

## 2023-03-23 NOTE — Progress Notes (Signed)
Specialty Pharmacy Refill Coordination Note  Andrew [name] is a 82 y.o. male contacted today regarding refills of specialty medication(s) Venetoclax   Patient requested Delivery   Delivery date: 03/29/23   Verified address: [name]   [place] [address]   Medication will be filled on 03/28/23.

## 2023-03-27 DIAGNOSIS — E039 Hypothyroidism, unspecified: Secondary | ICD-10-CM | POA: Diagnosis not present

## 2023-03-27 DIAGNOSIS — N184 Chronic kidney disease, stage 4 (severe): Secondary | ICD-10-CM | POA: Diagnosis not present

## 2023-04-05 DIAGNOSIS — Z7182 Exercise counseling: Secondary | ICD-10-CM | POA: Diagnosis not present

## 2023-04-05 DIAGNOSIS — Z713 Dietary counseling and surveillance: Secondary | ICD-10-CM | POA: Diagnosis not present

## 2023-04-05 DIAGNOSIS — N400 Enlarged prostate without lower urinary tract symptoms: Secondary | ICD-10-CM | POA: Diagnosis not present

## 2023-04-05 DIAGNOSIS — E039 Hypothyroidism, unspecified: Secondary | ICD-10-CM | POA: Diagnosis not present

## 2023-04-05 DIAGNOSIS — E669 Obesity, unspecified: Secondary | ICD-10-CM | POA: Diagnosis not present

## 2023-04-05 DIAGNOSIS — I129 Hypertensive chronic kidney disease with stage 1 through stage 4 chronic kidney disease, or unspecified chronic kidney disease: Secondary | ICD-10-CM | POA: Diagnosis not present

## 2023-04-05 DIAGNOSIS — N184 Chronic kidney disease, stage 4 (severe): Secondary | ICD-10-CM | POA: Diagnosis not present

## 2023-04-05 DIAGNOSIS — K2270 Barrett's esophagus without dysplasia: Secondary | ICD-10-CM | POA: Diagnosis not present

## 2023-04-05 DIAGNOSIS — Z79899 Other long term (current) drug therapy: Secondary | ICD-10-CM | POA: Diagnosis not present

## 2023-04-05 DIAGNOSIS — Z6829 Body mass index (BMI) 29.0-29.9, adult: Secondary | ICD-10-CM | POA: Diagnosis not present

## 2023-04-05 DIAGNOSIS — Z79890 Hormone replacement therapy (postmenopausal): Secondary | ICD-10-CM | POA: Diagnosis not present

## 2023-04-05 DIAGNOSIS — I10 Essential (primary) hypertension: Secondary | ICD-10-CM | POA: Diagnosis not present

## 2023-04-18 ENCOUNTER — Other Ambulatory Visit: Payer: Self-pay

## 2023-04-20 ENCOUNTER — Other Ambulatory Visit: Payer: Self-pay | Admitting: Hematology

## 2023-04-20 ENCOUNTER — Other Ambulatory Visit: Payer: Self-pay

## 2023-04-20 DIAGNOSIS — C9110 Chronic lymphocytic leukemia of B-cell type not having achieved remission: Secondary | ICD-10-CM

## 2023-04-20 NOTE — Progress Notes (Signed)
Specialty Pharmacy Refill Coordination Note  Andrew [name] is a 82 y.o. male contacted today regarding refills of specialty medication(s) Venetoclax   Patient requested Delivery   Delivery date: 04/25/23   Verified address: [name]   [place] [address]   Medication will be filled on 04/24/23.  Refill request pending. Notify if delayed.

## 2023-04-24 ENCOUNTER — Other Ambulatory Visit: Payer: Self-pay | Admitting: *Deleted

## 2023-04-24 ENCOUNTER — Other Ambulatory Visit: Payer: Self-pay

## 2023-04-24 MED ORDER — VENETOCLAX 100 MG PO TABS
300.0000 mg | ORAL_TABLET | Freq: Every day | ORAL | 3 refills | Status: DC
  Filled 2023-04-24: qty 84, 28d supply, fill #0
  Filled 2023-05-10: qty 84, 28d supply, fill #1
  Filled 2023-06-13: qty 84, 28d supply, fill #2
  Filled 2023-07-17: qty 84, 28d supply, fill #3

## 2023-04-24 NOTE — Telephone Encounter (Signed)
Venetoclax refill approved.  Patient tolerating and is to continue therapy.

## 2023-04-29 ENCOUNTER — Inpatient Hospital Stay (HOSPITAL_COMMUNITY): Payer: Medicare HMO

## 2023-04-29 ENCOUNTER — Emergency Department (HOSPITAL_COMMUNITY): Payer: Medicare HMO

## 2023-04-29 ENCOUNTER — Encounter (HOSPITAL_COMMUNITY): Payer: Self-pay | Admitting: Internal Medicine

## 2023-04-29 ENCOUNTER — Inpatient Hospital Stay (HOSPITAL_COMMUNITY)
Admission: EM | Admit: 2023-04-29 | Discharge: 2023-05-05 | DRG: 321 | Disposition: A | Discharge status: Home / Self Care | Payer: Medicare HMO | Attending: Internal Medicine | Admitting: Internal Medicine

## 2023-04-29 ENCOUNTER — Other Ambulatory Visit: Payer: Self-pay

## 2023-04-29 DIAGNOSIS — Z01810 Encounter for preprocedural cardiovascular examination: Secondary | ICD-10-CM | POA: Diagnosis not present

## 2023-04-29 DIAGNOSIS — I252 Old myocardial infarction: Secondary | ICD-10-CM | POA: Diagnosis not present

## 2023-04-29 DIAGNOSIS — Z8249 Family history of ischemic heart disease and other diseases of the circulatory system: Secondary | ICD-10-CM

## 2023-04-29 DIAGNOSIS — Z79890 Hormone replacement therapy (postmenopausal): Secondary | ICD-10-CM

## 2023-04-29 DIAGNOSIS — I517 Cardiomegaly: Secondary | ICD-10-CM | POA: Diagnosis not present

## 2023-04-29 DIAGNOSIS — R9082 White matter disease, unspecified: Secondary | ICD-10-CM | POA: Diagnosis not present

## 2023-04-29 DIAGNOSIS — D509 Iron deficiency anemia, unspecified: Secondary | ICD-10-CM | POA: Diagnosis not present

## 2023-04-29 DIAGNOSIS — K219 Gastro-esophageal reflux disease without esophagitis: Secondary | ICD-10-CM | POA: Diagnosis present

## 2023-04-29 DIAGNOSIS — I428 Other cardiomyopathies: Secondary | ICD-10-CM | POA: Diagnosis present

## 2023-04-29 DIAGNOSIS — I132 Hypertensive heart and chronic kidney disease with heart failure and with stage 5 chronic kidney disease, or end stage renal disease: Secondary | ICD-10-CM | POA: Diagnosis present

## 2023-04-29 DIAGNOSIS — N185 Chronic kidney disease, stage 5: Secondary | ICD-10-CM

## 2023-04-29 DIAGNOSIS — M1990 Unspecified osteoarthritis, unspecified site: Secondary | ICD-10-CM | POA: Diagnosis present

## 2023-04-29 DIAGNOSIS — Z809 Family history of malignant neoplasm, unspecified: Secondary | ICD-10-CM

## 2023-04-29 DIAGNOSIS — I482 Chronic atrial fibrillation, unspecified: Secondary | ICD-10-CM

## 2023-04-29 DIAGNOSIS — I4891 Unspecified atrial fibrillation: Secondary | ICD-10-CM | POA: Diagnosis not present

## 2023-04-29 DIAGNOSIS — Z951 Presence of aortocoronary bypass graft: Secondary | ICD-10-CM

## 2023-04-29 DIAGNOSIS — Z87891 Personal history of nicotine dependence: Secondary | ICD-10-CM

## 2023-04-29 DIAGNOSIS — Z87442 Personal history of urinary calculi: Secondary | ICD-10-CM

## 2023-04-29 DIAGNOSIS — R079 Chest pain, unspecified: Principal | ICD-10-CM

## 2023-04-29 DIAGNOSIS — C9110 Chronic lymphocytic leukemia of B-cell type not having achieved remission: Secondary | ICD-10-CM | POA: Diagnosis present

## 2023-04-29 DIAGNOSIS — I5023 Acute on chronic systolic (congestive) heart failure: Secondary | ICD-10-CM | POA: Diagnosis not present

## 2023-04-29 DIAGNOSIS — D61818 Other pancytopenia: Secondary | ICD-10-CM | POA: Diagnosis not present

## 2023-04-29 DIAGNOSIS — Z7982 Long term (current) use of aspirin: Secondary | ICD-10-CM

## 2023-04-29 DIAGNOSIS — Z8673 Personal history of transient ischemic attack (TIA), and cerebral infarction without residual deficits: Secondary | ICD-10-CM | POA: Diagnosis not present

## 2023-04-29 DIAGNOSIS — I4892 Unspecified atrial flutter: Secondary | ICD-10-CM | POA: Diagnosis not present

## 2023-04-29 DIAGNOSIS — N184 Chronic kidney disease, stage 4 (severe): Secondary | ICD-10-CM | POA: Diagnosis not present

## 2023-04-29 DIAGNOSIS — I4821 Permanent atrial fibrillation: Secondary | ICD-10-CM | POA: Diagnosis present

## 2023-04-29 DIAGNOSIS — I255 Ischemic cardiomyopathy: Secondary | ICD-10-CM | POA: Diagnosis present

## 2023-04-29 DIAGNOSIS — R0989 Other specified symptoms and signs involving the circulatory and respiratory systems: Secondary | ICD-10-CM | POA: Diagnosis not present

## 2023-04-29 DIAGNOSIS — Z79899 Other long term (current) drug therapy: Secondary | ICD-10-CM

## 2023-04-29 DIAGNOSIS — S065XAA Traumatic subdural hemorrhage with loss of consciousness status unknown, initial encounter: Secondary | ICD-10-CM | POA: Diagnosis present

## 2023-04-29 DIAGNOSIS — I5020 Unspecified systolic (congestive) heart failure: Secondary | ICD-10-CM | POA: Diagnosis not present

## 2023-04-29 DIAGNOSIS — Z820 Family history of epilepsy and other diseases of the nervous system: Secondary | ICD-10-CM

## 2023-04-29 DIAGNOSIS — I214 Non-ST elevation (NSTEMI) myocardial infarction: Principal | ICD-10-CM

## 2023-04-29 DIAGNOSIS — I2510 Atherosclerotic heart disease of native coronary artery without angina pectoris: Secondary | ICD-10-CM | POA: Diagnosis not present

## 2023-04-29 DIAGNOSIS — I5021 Acute systolic (congestive) heart failure: Secondary | ICD-10-CM | POA: Diagnosis not present

## 2023-04-29 DIAGNOSIS — E785 Hyperlipidemia, unspecified: Secondary | ICD-10-CM | POA: Diagnosis not present

## 2023-04-29 DIAGNOSIS — M19012 Primary osteoarthritis, left shoulder: Secondary | ICD-10-CM | POA: Diagnosis not present

## 2023-04-29 DIAGNOSIS — E039 Hypothyroidism, unspecified: Secondary | ICD-10-CM | POA: Diagnosis present

## 2023-04-29 DIAGNOSIS — I7121 Aneurysm of the ascending aorta, without rupture: Secondary | ICD-10-CM | POA: Diagnosis not present

## 2023-04-29 DIAGNOSIS — Z992 Dependence on renal dialysis: Secondary | ICD-10-CM | POA: Diagnosis not present

## 2023-04-29 DIAGNOSIS — M25512 Pain in left shoulder: Secondary | ICD-10-CM | POA: Diagnosis not present

## 2023-04-29 DIAGNOSIS — I700 Atherosclerosis of aorta: Secondary | ICD-10-CM | POA: Diagnosis not present

## 2023-04-29 DIAGNOSIS — Z955 Presence of coronary angioplasty implant and graft: Secondary | ICD-10-CM

## 2023-04-29 HISTORY — DX: Heart failure, unspecified: I50.9

## 2023-04-29 LAB — APTT: aPTT: 25 s (ref 24–36)

## 2023-04-29 LAB — PROTIME-INR
INR: 1.1 (ref 0.8–1.2)
Prothrombin Time: 14 s (ref 11.4–15.2)

## 2023-04-29 LAB — CBC
HCT: 37.5 % — ABNORMAL LOW (ref 39.0–52.0)
Hemoglobin: 12.9 g/dL — ABNORMAL LOW (ref 13.0–17.0)
MCH: 35.1 pg — ABNORMAL HIGH (ref 26.0–34.0)
MCHC: 34.4 g/dL (ref 30.0–36.0)
MCV: 101.9 fL — ABNORMAL HIGH (ref 80.0–100.0)
Platelets: 100 10*3/uL — ABNORMAL LOW (ref 150–400)
RBC: 3.68 MIL/uL — ABNORMAL LOW (ref 4.22–5.81)
RDW: 12.7 % (ref 11.5–15.5)
WBC: 3.1 10*3/uL — ABNORMAL LOW (ref 4.0–10.5)
nRBC: 0 % (ref 0.0–0.2)

## 2023-04-29 LAB — TROPONIN I (HIGH SENSITIVITY)
Troponin I (High Sensitivity): 239 ng/L (ref <18)
Troponin I (High Sensitivity): 453 ng/L (ref <18)
Troponin I (High Sensitivity): 3153 ng/L (ref <18)
Troponin I (High Sensitivity): 6724 ng/L (ref <18)

## 2023-04-29 LAB — ECHOCARDIOGRAM COMPLETE
Area-P 1/2: 4.49 cm2
Calc EF: 31 %
Height: 74 in
S' Lateral: 4.1 cm
Single Plane A2C EF: 22 %
Single Plane A4C EF: 39.4 %
Weight: 3568 [oz_av]

## 2023-04-29 LAB — BASIC METABOLIC PANEL
Anion gap: 10 (ref 5–15)
BUN: 36 mg/dL — ABNORMAL HIGH (ref 8–23)
CO2: 23 mmol/L (ref 22–32)
Calcium: 9.1 mg/dL (ref 8.9–10.3)
Chloride: 107 mmol/L (ref 98–111)
Creatinine, Ser: 3.11 mg/dL — ABNORMAL HIGH (ref 0.61–1.24)
GFR, Estimated: 19 mL/min — ABNORMAL LOW (ref >60)
Glucose, Bld: 141 mg/dL — ABNORMAL HIGH (ref 70–99)
Potassium: 4.1 mmol/L (ref 3.5–5.1)
Sodium: 140 mmol/L (ref 135–145)

## 2023-04-29 LAB — HEPARIN LEVEL (UNFRACTIONATED): Heparin Unfractionated: 0.23 [IU]/mL — ABNORMAL LOW (ref 0.30–0.70)

## 2023-04-29 LAB — BRAIN NATRIURETIC PEPTIDE: B Natriuretic Peptide: 704 pg/mL — ABNORMAL HIGH (ref 0.0–100.0)

## 2023-04-29 MED ORDER — ONDANSETRON HCL 4 MG/2ML IJ SOLN: 4.0000 mg | Freq: Four times a day (QID) | INTRAMUSCULAR | Status: DC | PRN

## 2023-04-29 MED ORDER — ISOSORBIDE MONONITRATE ER 30 MG PO TB24
30.0000 mg | ORAL_TABLET | Freq: Every day | ORAL | Status: DC
  Administered 2023-04-29 – 2023-05-05 (×7): 30 mg via ORAL
  Filled 2023-04-29 (×7): qty 1

## 2023-04-29 MED ORDER — HEPARIN (PORCINE) 25000 UT/250ML-% IV SOLN
1450.0000 [IU]/h | INTRAVENOUS | Status: DC
  Administered 2023-04-29: 1200 [IU]/h via INTRAVENOUS
  Administered 2023-04-30 (×2): 1400 [IU]/h via INTRAVENOUS
  Filled 2023-04-29 (×3): qty 250

## 2023-04-29 MED ORDER — ASPIRIN 81 MG PO CHEW
162.0000 mg | CHEWABLE_TABLET | Freq: Once | ORAL | Status: AC
  Administered 2023-04-29: 162 mg via ORAL
  Filled 2023-04-29: qty 2

## 2023-04-29 MED ORDER — PANTOPRAZOLE SODIUM 40 MG PO TBEC
40.0000 mg | DELAYED_RELEASE_TABLET | Freq: Every day | ORAL | Status: DC
  Administered 2023-04-30 – 2023-05-05 (×6): 40 mg via ORAL
  Filled 2023-04-29 (×6): qty 1

## 2023-04-29 MED ORDER — LEVOTHYROXINE SODIUM 100 MCG PO TABS
100.0000 ug | ORAL_TABLET | Freq: Every day | ORAL | Status: DC
  Administered 2023-04-30 – 2023-05-05 (×6): 100 ug via ORAL
  Filled 2023-04-29 (×6): qty 1

## 2023-04-29 MED ORDER — ONDANSETRON HCL 4 MG PO TABS: 4.0000 mg | ORAL_TABLET | Freq: Four times a day (QID) | ORAL | Status: DC | PRN

## 2023-04-29 MED ORDER — AMLODIPINE BESYLATE 5 MG PO TABS: 5.0000 mg | ORAL_TABLET | Freq: Every day | ORAL | Status: DC

## 2023-04-29 MED ORDER — VENETOCLAX 100 MG PO TABS: 300.0000 mg | ORAL_TABLET | Freq: Every day | ORAL | Status: DC

## 2023-04-29 MED ORDER — ASPIRIN 81 MG PO TBEC
81.0000 mg | DELAYED_RELEASE_TABLET | Freq: Every day | ORAL | Status: DC
  Administered 2023-04-30 – 2023-05-05 (×5): 81 mg via ORAL
  Filled 2023-04-29 (×5): qty 1

## 2023-04-29 MED ORDER — CARVEDILOL 6.25 MG PO TABS
6.2500 mg | ORAL_TABLET | Freq: Two times a day (BID) | ORAL | Status: DC
  Administered 2023-04-29 – 2023-05-04 (×11): 6.25 mg via ORAL
  Filled 2023-04-29 (×11): qty 1

## 2023-04-29 MED ORDER — TAMSULOSIN HCL 0.4 MG PO CAPS
0.4000 mg | ORAL_CAPSULE | Freq: Every day | ORAL | Status: DC
  Administered 2023-04-29 – 2023-05-04 (×6): 0.4 mg via ORAL
  Filled 2023-04-29 (×6): qty 1

## 2023-04-29 MED ORDER — PERFLUTREN LIPID MICROSPHERE
1.0000 mL | INTRAVENOUS | Status: AC | PRN
  Administered 2023-04-29: 2 mL via INTRAVENOUS

## 2023-04-29 MED ORDER — ADULT MULTIVITAMIN W/MINERALS CH
1.0000 | ORAL_TABLET | Freq: Every day | ORAL | Status: DC
  Administered 2023-04-29 – 2023-05-05 (×7): 1 via ORAL
  Filled 2023-04-29 (×7): qty 1

## 2023-04-29 MED ORDER — FERROUS SULFATE 325 (65 FE) MG PO TABS
325.0000 mg | ORAL_TABLET | ORAL | Status: DC
  Administered 2023-04-30 – 2023-05-04 (×3): 325 mg via ORAL
  Filled 2023-04-29 (×3): qty 1

## 2023-04-29 MED ORDER — ACETAMINOPHEN 650 MG RE SUPP: 650.0000 mg | Freq: Four times a day (QID) | RECTAL | Status: DC | PRN

## 2023-04-29 MED ORDER — HEPARIN BOLUS VIA INFUSION
4000.0000 [IU] | Freq: Once | INTRAVENOUS | Status: AC
  Administered 2023-04-29: 4000 [IU] via INTRAVENOUS

## 2023-04-29 MED ORDER — VITAMIN D 25 MCG (1000 UNIT) PO TABS
1000.0000 [IU] | ORAL_TABLET | Freq: Every day | ORAL | Status: DC
  Administered 2023-04-29 – 2023-05-05 (×7): 1000 [IU] via ORAL
  Filled 2023-04-29 (×11): qty 1

## 2023-04-29 MED ORDER — SODIUM BICARBONATE 650 MG PO TABS
650.0000 mg | ORAL_TABLET | Freq: Three times a day (TID) | ORAL | Status: DC
  Administered 2023-04-29 – 2023-05-05 (×18): 650 mg via ORAL
  Filled 2023-04-29 (×18): qty 1

## 2023-04-29 MED ORDER — VITAMIN B-12 1000 MCG PO TABS
1000.0000 ug | ORAL_TABLET | Freq: Every day | ORAL | Status: DC
  Administered 2023-04-29 – 2023-05-05 (×7): 1000 ug via ORAL
  Filled 2023-04-29 (×7): qty 1

## 2023-04-29 MED ORDER — ATORVASTATIN CALCIUM 80 MG PO TABS
80.0000 mg | ORAL_TABLET | Freq: Every day | ORAL | Status: DC
  Administered 2023-04-29 – 2023-05-05 (×7): 80 mg via ORAL
  Filled 2023-04-29 (×7): qty 1

## 2023-04-29 MED ORDER — HYDRALAZINE HCL 25 MG PO TABS
25.0000 mg | ORAL_TABLET | Freq: Three times a day (TID) | ORAL | Status: DC
  Administered 2023-04-29 – 2023-05-05 (×16): 25 mg via ORAL
  Filled 2023-04-29 (×17): qty 1

## 2023-04-29 MED ORDER — ACETAMINOPHEN 325 MG PO TABS
650.0000 mg | ORAL_TABLET | Freq: Four times a day (QID) | ORAL | Status: DC | PRN
  Administered 2023-04-30 – 2023-05-01 (×2): 650 mg via ORAL
  Filled 2023-04-29 (×2): qty 2

## 2023-04-29 NOTE — ED Notes (Signed)
Attending paged for nurse to give a critical result.

## 2023-04-29 NOTE — Consult Note (Signed)
Cardiology Consultation   Patient ID: Andrew [name] MRN: [id number]; DOB: 1941/05/27  Admit date: 04/29/2023 Date of Consult: 04/29/2023  PCP:  Benita Stabile, MD   [facility] Providers Cardiologist:  Dina Rich, MD        Patient Profile:   Andrew [name] is a 82 y.o. 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 Lowell Guitar.  History of Present Illness:   Andrew [name] is a 82 year old male with past medical history of systolic heart failure with improved EF, CLL, thrombocytopenia, CKD stage IV-V, permanent atrial fibrillation not on anticoagulation therapy due to prior history of subdural hematoma and chronic thrombocytopenia, subdural hematoma s/p craniotomy with evacuation 09/2016, hypertension and hyperlipidemia. Previous cardiac catheterization performed in January 2007 suggestive of nonischemic cardiomyopathy.  He had ejection fraction of 35% to time.  There was minimal luminal irregularities in the LAD along its entire length, minimal luminal irregularities in left circumflex and RCA otherwise no significant coronary artery disease.  Despite low EF of 35% in the past, ejection fraction improved to 55 to 60% in 2018.  His CLL medication Imbruvica was discontinued due to subdural hematoma in May 2018.  He has chronic dyspnea on physical exertion.  He was last seen by cardiology service in June 2024.  He was in his usual state of health until yesterday 04/28/2023, he had 1 minute of substernal chest discomfort radiating to the left shoulder.  Symptom resolved and did not come back until this morning where he had another episode that  lasted 45 minutes.  Both episodes occurs at rest.  This prompted the patient to seek urgent medical attention at [facility], ED.  On arrival, significant blood work included creatinine of 3.11 which is close to his baseline.  Serial troponin 239--> 453--> 3153.  This was suggestive of NSTEMI.  White blood cell count 3.1, hemoglobin 12.9, platelet 100.  CT of the head showed no acute intracranial abnormality, stable atrophy and white matter disease, right frontal parietal craniotomy without recurrent intracranial hemorrhage.  Chest x-ray showed cardiomegaly with pulmonary venous congestion but no frank pulmonary edema.  Left shoulder x-ray also showed no acute finding.  EKG showed atrial fibrillation, rate controlled.  Echocardiogram performed at [facility] on 04/29/2023 showed EF 25 to 30%, mid to distal septum and apical anterior segment and apex were akinetic, moderate LAE, mild MR, dilated thoracic aorta measuring at 44 mm.  Echocardiogram finding consistent with LAD infarct.  Given elevation of the troponin, patient was transferred to [facility] for further evaluation.  He has been admitted by hospitalist service.  Nephrology service is being consulted.    Past Medical History:  Diagnosis Date   Arthritis    Ascending aortic aneurysm (HCC)    CKD (chronic kidney disease), stage IV (HCC)    followed by [facility]   CLL (chronic lymphocytic leukemia) (HCC)    Complication of anesthesia    "wild when I woke up "   History of kidney stones    passed    Hypertension    NICM (nonischemic cardiomyopathy) (HCC)    a. cath 2007, EF 35%, minimal CAD. b. EF normal 2018.   PAF (paroxysmal  atrial fibrillation) (HCC)    Pinched nerve in neck    Pneumonia    SDH (subdural hematoma) (HCC)    Thrombocytopenia (HCC)     Past Surgical History:  Procedure Laterality Date   AV FISTULA PLACEMENT Left 04/30/2020   Procedure: LEFT ARM BRACHIOCEPHALIC ARTERIOVENOUS (AV) FISTULA CREATION;   Surgeon: Nada Libman, MD;  Location: [facility] OR;  Service: Vascular;  Laterality: Left;   BIOPSY  08/05/2018   Procedure: BIOPSY;  Surgeon: West Bali, MD;  Location: [facility] ENDO SUITE;  Service: Endoscopy;;  gastric    Bone marrow biospy Bilateral    CATARACT EXTRACTION, BILATERAL     COLONOSCOPY  07/10/2011   Procedure: COLONOSCOPY;  Surgeon: Arlyce Harman, MD;  Location: [facility] ENDO SUITE;  Service: Endoscopy;  Laterality: N/A;  10:30 AM   CRANIOTOMY N/A 10/14/2016   Procedure: CRANIOTOMY HEMATOMA EVACUATION SUBDURAL;  Surgeon: Shirlean Kelly, MD;  Location: [facility] OR;  Service: Neurosurgery;  Laterality: N/A;  CRANIOTOMY HEMATOMA EVACUATION SUBDURAL   ESOPHAGOGASTRODUODENOSCOPY N/A 08/05/2018   Procedure: ESOPHAGOGASTRODUODENOSCOPY (EGD);  Surgeon: West Bali, MD;  Location: [facility] ENDO SUITE;  Service: Endoscopy;  Laterality: N/A;  3:00pm   THYROIDECTOMY, PARTIAL     TONSILLECTOMY       Home Medications:  Prior to Admission medications   Medication Sig Start Date End Date Taking? Authorizing Provider  acetaminophen (TYLENOL) 325 MG tablet Take 650 mg by mouth every 6 (six) hours as needed for mild pain (pain score 1-3).   Yes [provider]  amLODipine (NORVASC) 5 MG tablet Take 5 mg by mouth daily.   Yes [provider]  carvedilol (COREG) 6.25 MG tablet Take 1 tablet (6.25 mg total) by mouth 2 (two) times daily. 05/16/18  Yes BranchDorothe Pea, MD  Cholecalciferol (VITAMIN D3) 25 MCG (1000 UT) CAPS Take 1 capsule by mouth daily. 05/18/20  Yes [provider]  Cyanocobalamin (VITAMIN B12) 1000 MCG TBCR Take 1,000 mcg by mouth daily.  06/30/19  Yes [provider]  ferrous sulfate 325 (65 FE) MG EC tablet Take 325 mg by mouth 3 (three) times a week.   Yes [provider]  levothyroxine (SYNTHROID) 25 MCG tablet Take 25 mcg by mouth daily. 04/05/23  Yes [provider]  Multiple Vitamin (MULITIVITAMIN WITH MINERALS) TABS Take 1 tablet by  mouth daily.   Yes [provider]  omeprazole (PRILOSEC) 20 MG capsule 1 PO 30 MINS PRIOR TO BREAKFAST. Patient taking differently: Take 20 mg by mouth daily. 03/13/19  Yes Ermalinda Memos S, PA-C  sodium bicarbonate 650 MG tablet Take 650 mg by mouth 3 (three) times daily. 10/30/22  Yes [provider]  tamsulosin (FLOMAX) 0.4 MG CAPS capsule Take 0.4 mg by mouth daily.  07/19/17  Yes [provider]  venetoclax (VENCLEXTA) 100 MG tablet Take 3 tablets (300 mg total) by mouth daily. 04/24/23  Yes Doreatha Massed, MD    Inpatient Medications: Scheduled Meds:  [START ON 04/30/2023] aspirin EC  81 mg Oral Daily   atorvastatin  80 mg Oral Daily   carvedilol  6.25 mg Oral BID   cholecalciferol  1,000 Units Oral Daily   cyanocobalamin  1,000 mcg Oral Daily   [START ON 04/30/2023] ferrous sulfate  325 mg Oral Once per day on Monday Wednesday Friday   hydrALAZINE  25 mg Oral Q8H   isosorbide mononitrate  30 mg Oral Daily   multivitamin with minerals  1 tablet Oral Daily   [START ON  04/30/2023] pantoprazole  40 mg Oral Daily   sodium bicarbonate  650 mg Oral TID   tamsulosin  0.4 mg Oral QPC supper   venetoclax  300 mg Oral Daily   Continuous Infusions:  heparin 1,200 Units/hr (04/29/23 1139)   PRN Meds: acetaminophen **OR** acetaminophen, ondansetron **OR** ondansetron (ZOFRAN) IV  Allergies:   No Known Allergies  Social History:   Social History   Socioeconomic History   Marital status: Married    Spouse name: Not on file   Number of children: Not on file   Years of education: Not on file   Highest education level: Not on file  Occupational History   Not on file  Tobacco Use   Smoking status: Former    Current packs/day: 0.00    Average packs/day: 1 pack/day for 15.0 years (15.0 ttl pk-yrs)    Types: Cigarettes    Start date: 01/26/1956    Quit date: 01/26/1971    Years since quitting: 52.2   Smokeless tobacco: Never  Vaping Use   Vaping status:  Never Used  Substance and Sexual Activity   Alcohol use: No   Drug use: No   Sexual activity: Not on file  Other Topics Concern   Not on file  Social History Narrative   Not on file   Social Determinants of Health   Financial Resource Strain: Low Risk  (08/30/2020)   Overall Financial Resource Strain (CARDIA)    Difficulty of Paying Living Expenses: Not hard at all  Food Insecurity: No Food Insecurity (08/30/2020)   Hunger Vital Sign    Worried About Running Out of Food in the Last Year: Never true    Ran Out of Food in the Last Year: Never true  Transportation Needs: No Transportation Needs (08/30/2020)   PRAPARE - Administrator, Civil Service (Medical): No    Lack of Transportation (Non-Medical): No  Physical Activity: Inactive (08/30/2020)   Exercise Vital Sign    Days of Exercise per Week: 0 days    Minutes of Exercise per Session: 0 min  Stress: No Stress Concern Present (08/30/2020)   [employer] of Occupational Health - Occupational Stress Questionnaire    Feeling of Stress : Not at all  Social Connections: Moderately Integrated (08/30/2020)   Social Connection and Isolation Panel [NHANES]    Frequency of Communication with Friends and Family: More than three times a week    Frequency of Social Gatherings with Friends and Family: More than three times a week    Attends Religious Services: More than 4 times per year    Active Member of [employer] or Organizations: No    Attends Banker Meetings: Never    Marital Status: Married  Catering manager Violence: Not At Risk (08/30/2020)   Humiliation, Afraid, Rape, and Kick questionnaire    Fear of Current or Ex-Partner: No    Emotionally Abused: No    Physically Abused: No    Sexually Abused: No    Family History:    Family History  Problem Relation Age of Onset   CAD Father 31   Cancer Mother    Alzheimer's disease Mother    Colon cancer Neg Hx      ROS:  Please see the history of present illness.    All other ROS reviewed and negative.     Physical Exam/Data:   Vitals:   04/29/23 1300 04/29/23 1315 04/29/23 1400 04/29/23 1415  BP: (!) 146/103  (!) 140/94  Pulse: 72  75   Resp: (!) 27 18 18    Temp:    97.9 F (36.6 C)  TempSrc:    Oral  SpO2: 95%  96%   Weight:      Height:        Intake/Output Summary (Last 24 hours) at 04/29/2023 1737 Last data filed at 04/29/2023 1141 Gross per 24 hour  Intake --  Output 500 ml  Net -500 ml      04/29/2023    8:20 AM 01/18/2023    8:03 AM 11/02/2022    1:18 PM  Last 3 Weights  Weight (lbs) 223 lb 229 lb 6.4 oz 232 lb 6.4 oz  Weight (kg) 101.152 kg 104.055 kg 105.416 kg     Body mass index is 28.63 kg/m.  General:  Well nourished, well developed, in no acute distress HEENT: normal Neck: no JVD Vascular: No carotid bruits; Distal pulses 2+ bilaterally Cardiac:  normal S1, S2; RRR; no murmur  Lungs:  clear to auscultation bilaterally, no wheezing, rhonchi or rales  Abd: soft, nontender, no hepatomegaly  Ext: no edema Musculoskeletal:  No deformities, BUE and BLE strength normal and equal Skin: warm and dry  Neuro:  CNs 2-12 intact, no focal abnormalities noted Psych:  Normal affect   EKG:  The EKG was personally reviewed and demonstrates: Rate controlled atrial fibrillation, no significant ST-T wave changes.   Telemetry:  Telemetry was personally reviewed and demonstrates:  Just arrived at [facility], has not been hooked up to telemetry  Relevant CV Studies:  Echo 10/14/2016 LV EF: 55% -   60%  Study Conclusions   - Left ventricle: The cavity size was normal. There was mild    concentric hypertrophy. Systolic function was normal. The    estimated ejection fraction was in the range of 55% to 60%. Wall    motion was normal; there were no regional wall motion    abnormalities.  - Aortic valve: There was trivial regurgitation.  - Left atrium: The atrium was mildly dilated.     Echo 04/29/2023 1. Mid to distal deptum, apical  anterior segment, and apex are akinetic.  No LV thrombus on contrast imaging. Other segments are hypokinetic. Left  ventricular ejection fraction, by estimation, is 25 to 30%. The left  ventricle has severely decreased  function. The left ventricle demonstrates regional wall motion  abnormalities (see scoring diagram/findings for description). The left  ventricular internal cavity size was mildly dilated. There is mild left  ventricular hypertrophy. Left ventricular  diastolic parameters are indeterminate.   2. Right ventricular systolic function is normal. The right ventricular  size is moderately enlarged. Tricuspid regurgitation signal is inadequate  for assessing PA pressure.   3. Left atrial size was moderately dilated.   4. Right atrial size was severely dilated.   5. The mitral valve is normal in structure. Mild mitral valve  regurgitation. No evidence of mitral stenosis.   6. The aortic valve is tricuspid. There is mild calcification of the  aortic valve. There is mild thickening of the aortic valve. Aortic valve  regurgitation is trivial. Aortic valve sclerosis is present, with no  evidence of aortic valve stenosis.   7. Aortic dilatation noted. There is moderate dilatation of the ascending  aorta, measuring 44 mm.   8. The inferior vena cava is dilated in size with >50% respiratory  variability, suggesting right atrial pressure of 8 mmHg.   Comparison(s): The left ventricular function is significantly worse.   Conclusion(s)/Recommendation(s):  Findings consistent with ischemic  cardiomyopathy. No left ventricular mural or apical thrombus/thrombi.    Laboratory Data:  High Sensitivity Troponin:   Recent Labs  Lab 04/29/23 0904 04/29/23 1029 04/29/23 1432  TROPONINIHS 239* 453* 3,153*     Chemistry Recent Labs  Lab 04/29/23 0904  NA 140  K 4.1  CL 107  CO2 23  GLUCOSE 141*  BUN 36*  CREATININE 3.11*  CALCIUM 9.1  GFRNONAA 19*  ANIONGAP 10    No results  for input(s): "PROT", "ALBUMIN", "AST", "ALT", "ALKPHOS", "BILITOT" in the last 168 hours. Lipids No results for input(s): "CHOL", "TRIG", "HDL", "LABVLDL", "LDLCALC", "CHOLHDL" in the last 168 hours.  Hematology Recent Labs  Lab 04/29/23 0904  WBC 3.1*  RBC 3.68*  HGB 12.9*  HCT 37.5*  MCV 101.9*  MCH 35.1*  MCHC 34.4  RDW 12.7  PLT 100*   Thyroid No results for input(s): "TSH", "FREET4" in the last 168 hours.  BNPNo results for input(s): "BNP", "PROBNP" in the last 168 hours.  DDimer No results for input(s): "DDIMER" in the last 168 hours.   Radiology/Studies:  ECHOCARDIOGRAM COMPLETE  Result Date: 04/29/2023    ECHOCARDIOGRAM REPORT   Patient Name:   Andrew [name] Date of Exam: 04/29/2023 Medical Rec #:  [id number]      Height:       74.0 in Accession #:    [id number]     Weight:       223.0 lb Date of Birth:  1940/10/06     BSA:          2.276 m Patient Age:    82 years       BP:           140/94 mmHg Patient Gender: M              HR:           68 bpm. [facility] Procedure: 2D Echo, Cardiac Doppler, Color Doppler and Intracardiac            Opacification Agent Indications:    R07.9* Chest pain, unspecified  History:        Patient has prior history of Echocardiogram examinations, most                 recent 08/14/2016. Previous Myocardial Infarction, Abnormal ECG,                 Arrythmias:Atrial Fibrillation, Signs/Symptoms:Chest Pain; Risk                 Factors:Hypertension. Leukemia.  Sonographer:    Sheralyn Boatman RDCS Referring Phys: [id number] Lamont Dowdy [facility]  Sonographer Comments: Technically difficult study due to poor echo windows. IMPRESSIONS  1. Mid to distal deptum, apical anterior segment, and apex are akinetic. No LV thrombus on contrast imaging. Other segments are hypokinetic. Left ventricular ejection fraction, by estimation, is 25 to 30%. The left ventricle has severely decreased function. The left ventricle demonstrates regional wall motion abnormalities (see  scoring diagram/findings for description). The left ventricular internal cavity size was mildly dilated. There is mild left ventricular hypertrophy. Left ventricular diastolic parameters are indeterminate.  2. Right ventricular systolic function is normal. The right ventricular size is moderately enlarged. Tricuspid regurgitation signal is inadequate for assessing PA pressure.  3. Left atrial size was moderately dilated.  4. Right atrial size was severely dilated.  5. The mitral valve is normal in structure. Mild mitral valve regurgitation. No evidence  of mitral stenosis.  6. The aortic valve is tricuspid. There is mild calcification of the aortic valve. There is mild thickening of the aortic valve. Aortic valve regurgitation is trivial. Aortic valve sclerosis is present, with no evidence of aortic valve stenosis.  7. Aortic dilatation noted. There is moderate dilatation of the ascending aorta, measuring 44 mm.  8. The inferior vena cava is dilated in size with >50% respiratory variability, suggesting right atrial pressure of 8 mmHg. Comparison(s): The left ventricular function is significantly worse. Conclusion(s)/Recommendation(s): Findings consistent with ischemic cardiomyopathy. No left ventricular mural or apical thrombus/thrombi. FINDINGS  Left Ventricle: Mid to distal deptum, apical anterior segment, and apex are akinetic. No LV thrombus on contrast imaging. Other segments are hypokinetic. Left ventricular ejection fraction, by estimation, is 25 to 30%. The left ventricle has severely decreased function. The left ventricle demonstrates regional wall motion abnormalities. Definity contrast agent was given IV to delineate the left ventricular endocardial borders. The left ventricular internal cavity size was mildly dilated. There is mild left ventricular hypertrophy. Left ventricular diastolic function could not be evaluated due to atrial fibrillation. Left ventricular diastolic parameters are indeterminate.   LV Wall Scoring: The mid and distal anterior septum, apical anterior segment, and apex are akinetic. Right Ventricle: The right ventricular size is moderately enlarged. No increase in right ventricular wall thickness. Right ventricular systolic function is normal. Tricuspid regurgitation signal is inadequate for assessing PA pressure. Left Atrium: Left atrial size was moderately dilated. Right Atrium: Right atrial size was severely dilated. Pericardium: There is no evidence of pericardial effusion. Mitral Valve: The mitral valve is normal in structure. Mild mitral valve regurgitation. No evidence of mitral valve stenosis. Tricuspid Valve: The tricuspid valve is normal in structure. Tricuspid valve regurgitation is mild . No evidence of tricuspid stenosis. Aortic Valve: The aortic valve is tricuspid. There is mild calcification of the aortic valve. There is mild thickening of the aortic valve. Aortic valve regurgitation is trivial. Aortic valve sclerosis is present, with no evidence of aortic valve stenosis. Pulmonic Valve: The pulmonic valve was grossly normal. Pulmonic valve regurgitation is mild. No evidence of pulmonic stenosis. Aorta: Aortic dilatation noted and the aortic root is normal in size and structure. There is moderate dilatation of the ascending aorta, measuring 44 mm. Venous: The inferior vena cava is dilated in size with greater than 50% respiratory variability, suggesting right atrial pressure of 8 mmHg. IAS/Shunts: No atrial level shunt detected by color flow Doppler.  LEFT VENTRICLE PLAX 2D LVIDd:         5.60 cm LVIDs:         4.10 cm LV PW:         1.40 cm LV IVS:        1.20 cm LVOT diam:     2.55 cm LV SV:         81 LV SV Index:   36 LVOT Area:     5.11 cm  LV Volumes (MOD) LV vol d, MOD A2C: 152.0 ml LV vol d, MOD A4C: 160.5 ml LV vol s, MOD A2C: 118.5 ml LV vol s, MOD A4C: 97.3 ml LV SV MOD A2C:     33.5 ml LV SV MOD A4C:     160.5 ml LV SV MOD BP:      48.0 ml RIGHT VENTRICLE              IVC RV S prime:     13.20 cm/s  IVC diam: 2.60 cm  TAPSE (M-mode): 1.7 cm LEFT ATRIUM              Index        RIGHT ATRIUM           Index LA diam:        4.30 cm  1.89 cm/m   RA Area:     29.60 cm LA Vol (A2C):   159.0 ml 69.85 ml/m  RA Volume:   98.20 ml  43.14 ml/m LA Vol (A4C):   103.0 ml 45.25 ml/m LA Biplane Vol: 129.0 ml 56.67 ml/m  AORTIC VALVE             PULMONIC VALVE LVOT Vmax:   86.40 cm/s  PR End Diast Vel: 1.33 msec LVOT Vmean:  51.400 cm/s LVOT VTI:    0.159 m  AORTA Ao Root diam: 3.90 cm Ao Asc diam:  4.40 cm MITRAL VALVE MV Area (PHT): 4.49 cm     SHUNTS MV Decel Time: 169 msec     Systemic VTI:  0.16 m MV E velocity: 105.00 cm/s  Systemic Diam: 2.55 cm Lennie Odor MD Electronically signed by Lennie Odor MD Signature Date/Time: 04/29/2023/3:50:35 PM    Final    CT Head Wo Contrast  Result Date: 04/29/2023 CLINICAL DATA:  Chest pain. Personal history of intracranial hemorrhage. Need to start heparin. EXAM: CT HEAD WITHOUT CONTRAST TECHNIQUE: Contiguous axial images were obtained from the base of the skull through the vertex without intravenous contrast. RADIATION DOSE REDUCTION: This exam was performed according to the departmental dose-optimization program which includes automated exposure control, adjustment of the mA and/or kV according to patient size and/or use of iterative reconstruction technique. COMPARISON:  CT head without contrast 12/11/2016 FINDINGS: Brain: Mild atrophy and white matter changes are stable. The ventricles are of normal size. No significant extraaxial fluid collection is present. The brainstem and cerebellum are within normal limits. Craniotomy noted. No residual or recurrent intracranial hemorrhage is present. Vascular: No hyperdense vessel or unexpected calcification. Skull: Right frontal parietal craniotomy noted. Calvarium is otherwise intact. No significant extracranial soft tissue lesion is present. Sinuses/Orbits: The paranasal sinuses and mastoid air  cells are clear. Bilateral lens replacements are noted. Globes and orbits are otherwise unremarkable. IMPRESSION: 1. No acute intracranial abnormality or significant interval change. 2. Stable atrophy and white matter disease. This likely reflects the sequela of chronic microvascular ischemia. 3. Right frontal parietal craniotomy without residual or recurrent intracranial hemorrhage. These results were called by telephone at the time of interpretation on 04/29/2023 at 10:48 am to provider Vonita Moss , who verbally acknowledged these results. Electronically Signed   By: Marin Roberts M.D.   On: 04/29/2023 11:09   DG Shoulder Left  Result Date: 04/29/2023 CLINICAL DATA:  82 year old male with history of left shoulder pain. EXAM: LEFT SHOULDER - 2+ VIEW COMPARISON:  No priors. FINDINGS: Three views of the left shoulder demonstrate no acute displaced fracture, subluxation or dislocation. There is joint space narrowing, subchondral sclerosis, subchondral cyst formation and osteophyte formation noted in the glenohumeral joint. IMPRESSION: 1. No acute abnormality of the left shoulder. 2. Glenohumeral joint osteoarthritis, as above. Electronically Signed   By: Trudie Reed M.D.   On: 04/29/2023 08:58   DG Chest 2 View  Result Date: 04/29/2023 CLINICAL DATA:  82 year old male with history of chest pain. EXAM: CHEST - 2 VIEW COMPARISON:  Chest x-ray 08/22/2017. FINDINGS: Lung volumes are normal. No consolidative airspace disease. No pleural effusions. No pneumothorax. No pulmonary nodule  or mass noted. Pulmonary vasculature appears engorged. Heart size is mildly enlarged. Upper mediastinal contours are within normal limits. Atherosclerotic calcifications are noted in the thoracic aorta. Atherosclerosis in the thoracic aorta. IMPRESSION: 1. Cardiomegaly with pulmonary venous congestion, but no frank pulmonary edema. 2. Aortic atherosclerosis. Electronically Signed   By: Trudie Reed M.D.   On:  04/29/2023 08:57     Assessment and Plan:   NSTEMI  -2 episode of chest pain, 1 episode was yesterday which was very brief, another episode this morning.  Troponin went up to more than 3000 suggestive of NSTEMI.  Echocardiogram obtained at any point hospital showed EF has dropped from the previous 55% in May 2018 down to 25 to 30% today, echocardiogram also showed wall motion abnormality consistent with LAD territory infarct.  -Patient is chest pain-free at this point.  He is on IV heparin. Options include medical therapy versus cardiac catheterization has been discussed with the patient by Dr. Flora Lipps.  Further discussion should be held tomorrow after patient sees nephrology service.  CKD stage IV: Creatinine at 3.1 on arrival.  This is near his baseline.  He has a left arm AV fistula that was created several years ago however never used as his renal function improved.  CLL: Managed by hematology service.  Permanent atrial fibrillation not on anticoagulation due to history of intracranial bleed: Occurred in 2018, no recurrence since then.  History intracranial bleed required craniotomy in May 2018: No recurrent intracranial bleed since.  His CLL medication Imbruvica discontinued at the time due to their association with high risk of bleeding.  hypertension: Blood pressure fairly controlled  Hyperlipidemia   Risk Assessment/Risk Scores:     TIMI Risk Score for Unstable Angina or Non-ST Elevation MI:   The patient's TIMI risk score is 5, which indicates a 26% risk of all cause mortality, new or recurrent myocardial infarction or need for urgent revascularization in the next 14 days.  New York Heart Association (NYHA) Functional Class NYHA Class II  CHA2DS2-VASc Score = 5   This indicates a 7.2% annual risk of stroke. The patient's score is based upon: CHF History: 1 HTN History: 1 Diabetes History: 0 Stroke History: 0 Vascular Disease History: 1 Age Score: 2 Gender Score: 0          For questions or updates, please contact [facility] Please consult [email or web address] for contact info under    Ramond Dial, [place]  04/29/2023 5:37 PM

## 2023-04-29 NOTE — H&P (Addendum)
History and Physical    Andrew [name]:[id number] DOB: 12/07/1940 DOA: 04/29/2023  PCP: Benita Stabile, MD   Patient coming from: Home  Chief Complaint: Chest pain  HPI: Andrew [name] is a 82 y.o. male with medical history significant for stage IV CLL, CKD stage IV, normocytic anemia, permanent atrial fibrillation not on anticoagulation due to prior subdural hematoma and chronic thrombocytopenia, HFpEF, ascending aortic aneurysm, and hypertension who presented to the ED with complaints of chest discomfort.  This began yesterday after he drank something and had some left shoulder soreness as well as some chest soreness and this lasted approximately 30 minutes and then spontaneously resolved.  This morning at 6 AM the same thing occurred again after he drank some water and it lasted approximately 15 minutes.  He states that he took some Tylenol and this pain resolved.  He denies any diaphoresis, nausea, vomiting, or dyspnea.  The pain was not exertional in nature.  His pain is not exacerbated by moving his shoulder.   ED Course: Vital signs with some mildly elevated blood pressure readings, but stable heart rates.  Hemoglobin 12.9 and platelet count of 100,000, creatinine at baseline with 3.11 and troponin noted to be 239.  Chest x-ray showing some mild cardiomegaly and pulmonary vascular congestion and shoulder x-ray negative for any acute findings.  CT head with no acute findings and EDP has discussed with cardiology with plans to start on heparin drip and transfer to [facility] for cardiology evaluation.  Review of Systems: Reviewed as noted above, otherwise negative.  Past Medical History:  Diagnosis Date   Arthritis    Ascending aortic aneurysm (HCC)    CKD (chronic kidney disease), stage IV (HCC)    followed by [facility]   CLL (chronic lymphocytic leukemia) (HCC)    Complication of anesthesia    "wild when I woke up "   History of kidney stones    passed    Hypertension     NICM (nonischemic cardiomyopathy) (HCC)    a. cath 2007, EF 35%, minimal CAD. b. EF normal 2018.   PAF (paroxysmal atrial fibrillation) (HCC)    Pinched nerve in neck    Pneumonia    SDH (subdural hematoma) (HCC)    Thrombocytopenia (HCC)     Past Surgical History:  Procedure Laterality Date   AV FISTULA PLACEMENT Left 04/30/2020   Procedure: LEFT ARM BRACHIOCEPHALIC ARTERIOVENOUS (AV) FISTULA CREATION;  Surgeon: Nada Libman, MD;  Location: [facility] OR;  Service: Vascular;  Laterality: Left;   BIOPSY  08/05/2018   Procedure: BIOPSY;  Surgeon: West Bali, MD;  Location: [facility] SUITE;  Service: Endoscopy;;  gastric    Bone marrow biospy Bilateral    CATARACT EXTRACTION, BILATERAL     COLONOSCOPY  07/10/2011   Procedure: COLONOSCOPY;  Surgeon: Arlyce Harman, MD;  Location: [facility];  Service: Endoscopy;  Laterality: N/A;  10:30 AM   CRANIOTOMY N/A 10/14/2016   Procedure: CRANIOTOMY HEMATOMA EVACUATION SUBDURAL;  Surgeon: Shirlean Kelly, MD;  Location: [facility] OR;  Service: Neurosurgery;  Laterality: N/A;  CRANIOTOMY HEMATOMA EVACUATION SUBDURAL   ESOPHAGOGASTRODUODENOSCOPY N/A 08/05/2018   Procedure: ESOPHAGOGASTRODUODENOSCOPY (EGD);  Surgeon: West Bali, MD;  Location: [facility];  Service: Endoscopy;  Laterality: N/A;  3:00pm   THYROIDECTOMY, PARTIAL     TONSILLECTOMY       reports that he quit smoking about 52 years ago. His smoking use included cigarettes. He started smoking about 67 years ago.  He has a 15 pack-year smoking history. He has never used smokeless tobacco. He reports that he does not drink alcohol and does not use drugs.  No Known Allergies  Family History  Problem Relation Age of Onset   CAD Father 25   Cancer Mother    Alzheimer's disease Mother    Colon cancer Neg Hx     Prior to Admission medications   Medication Sig Start Date End Date Taking? Authorizing Provider  amLODipine (NORVASC) 5 MG tablet Take 5 mg by mouth daily.    [provider]  carvedilol (COREG) 6.25 MG tablet Take 1 tablet (6.25 mg total) by mouth 2 (two) times daily. 05/16/18   Antoine Poche, MD  Cholecalciferol (VITAMIN D3) 25 MCG (1000 UT) CAPS Take 1 capsule by mouth daily. 05/18/20   [provider]  Cyanocobalamin (VITAMIN B12) 1000 MCG TBCR Take 1,000 mcg by mouth daily.  06/30/19   [provider]  ferrous sulfate 325 (65 FE) MG EC tablet Take 325 mg by mouth 3 (three) times a week.    [provider]  Multiple Vitamin (MULITIVITAMIN WITH MINERALS) TABS Take 1 tablet by mouth daily.    [provider]  omeprazole (PRILOSEC) 20 MG capsule 1 PO 30 MINS PRIOR TO BREAKFAST. Patient taking differently: Take 20 mg by mouth daily. 03/13/19   Letta Median, PA-C  sodium bicarbonate 650 MG tablet Take 650 mg by mouth 3 (three) times daily. 10/30/22   [provider]  tamsulosin (FLOMAX) 0.4 MG CAPS capsule Take 0.4 mg by mouth daily.  07/19/17   [provider]  venetoclax (VENCLEXTA) 100 MG tablet Take 3 tablets (300 mg total) by mouth daily. 04/24/23   Doreatha Massed, MD    Physical Exam: Vitals:   04/29/23 0820 04/29/23 0821 04/29/23 0823  BP:  (!) 147/102   Pulse:  80   Resp:  12   Temp:   97.6 F (36.4 C)  TempSrc:   Oral  SpO2:  97%   Weight: 101.2 kg    Height: 6\' 2"  (1.88 m)      Constitutional: NAD, calm, comfortable Vitals:   04/29/23 0820 04/29/23 0821 04/29/23 0823  BP:  (!) 147/102   Pulse:  80   Resp:  12   Temp:   97.6 F (36.4 C)  TempSrc:   Oral  SpO2:  97%   Weight: 101.2 kg    Height: 6\' 2"  (1.88 m)     Eyes: lids and conjunctivae normal Neck: normal, supple Respiratory: clear to auscultation bilaterally. Normal respiratory effort. No accessory muscle use.  Cardiovascular: Irregular rate and rhythm, no murmurs Abdomen: no tenderness, no distention. Bowel sounds positive.  Musculoskeletal:  No edema. Skin: no rashes, lesions, ulcers.  Psychiatric: Flat  affect  Labs on Admission: I have personally reviewed following labs and imaging studies  CBC: Recent Labs  Lab 04/29/23 0904  WBC 3.1*  HGB 12.9*  HCT 37.5*  MCV 101.9*  PLT 100*   Basic Metabolic Panel: Recent Labs  Lab 04/29/23 0904  NA 140  K 4.1  CL 107  CO2 23  GLUCOSE 141*  BUN 36*  CREATININE 3.11*  CALCIUM 9.1   GFR: Estimated Creatinine Clearance: 22.9 mL/min (A) (by C-G formula based on SCr of 3.11 mg/dL (H)). Liver Function Tests: No results for input(s): "AST", "ALT", "ALKPHOS", "BILITOT", "PROT", "ALBUMIN" in the last 168 hours. No results for input(s): "LIPASE", "AMYLASE" in the last 168 hours. No results  for input(s): "AMMONIA" in the last 168 hours. Coagulation Profile: Recent Labs  Lab 04/29/23 1111  INR 1.1   Cardiac Enzymes: No results for input(s): "CKTOTAL", "CKMB", "CKMBINDEX", "TROPONINI" in the last 168 hours. BNP (last 3 results) No results for input(s): "PROBNP" in the last 8760 hours. HbA1C: No results for input(s): "HGBA1C" in the last 72 hours. CBG: No results for input(s): "GLUCAP" in the last 168 hours. Lipid Profile: No results for input(s): "CHOL", "HDL", "LDLCALC", "TRIG", "CHOLHDL", "LDLDIRECT" in the last 72 hours. Thyroid Function Tests: No results for input(s): "TSH", "T4TOTAL", "FREET4", "T3FREE", "THYROIDAB" in the last 72 hours. Anemia Panel: No results for input(s): "VITAMINB12", "FOLATE", "FERRITIN", "TIBC", "IRON", "RETICCTPCT" in the last 72 hours. Urine analysis:    Component Value Date/Time   COLORURINE STRAW (A) 12/17/2017 1725   APPEARANCEUR CLEAR 12/17/2017 1725   LABSPEC 1.010 12/17/2017 1725   PHURINE 6.0 12/17/2017 1725   GLUCOSEU NEGATIVE 12/17/2017 1725   HGBUR MODERATE (A) 12/17/2017 1725   BILIRUBINUR NEGATIVE 12/17/2017 1725   KETONESUR NEGATIVE 12/17/2017 1725   PROTEINUR 30 (A) 12/17/2017 1725   NITRITE NEGATIVE 12/17/2017 1725   LEUKOCYTESUR TRACE (A) 12/17/2017 1725    Radiological  Exams on Admission: CT Head Wo Contrast  Result Date: 04/29/2023 CLINICAL DATA:  Chest pain. Personal history of intracranial hemorrhage. Need to start heparin. EXAM: CT HEAD WITHOUT CONTRAST TECHNIQUE: Contiguous axial images were obtained from the base of the skull through the vertex without intravenous contrast. RADIATION DOSE REDUCTION: This exam was performed according to the departmental dose-optimization program which includes automated exposure control, adjustment of the mA and/or kV according to patient size and/or use of iterative reconstruction technique. COMPARISON:  CT head without contrast 12/11/2016 FINDINGS: Brain: Mild atrophy and white matter changes are stable. The ventricles are of normal size. No significant extraaxial fluid collection is present. The brainstem and cerebellum are within normal limits. Craniotomy noted. No residual or recurrent intracranial hemorrhage is present. Vascular: No hyperdense vessel or unexpected calcification. Skull: Right frontal parietal craniotomy noted. Calvarium is otherwise intact. No significant extracranial soft tissue lesion is present. Sinuses/Orbits: The paranasal sinuses and mastoid air cells are clear. Bilateral lens replacements are noted. Globes and orbits are otherwise unremarkable. IMPRESSION: 1. No acute intracranial abnormality or significant interval change. 2. Stable atrophy and white matter disease. This likely reflects the sequela of chronic microvascular ischemia. 3. Right frontal parietal craniotomy without residual or recurrent intracranial hemorrhage. These results were called by telephone at the time of interpretation on 04/29/2023 at 10:48 am to provider Vonita Moss , who verbally acknowledged these results. Electronically Signed   By: Marin Roberts M.D.   On: 04/29/2023 11:09   DG Shoulder Left  Result Date: 04/29/2023 CLINICAL DATA:  82 year old male with history of left shoulder pain. EXAM: LEFT SHOULDER - 2+ VIEW  COMPARISON:  No priors. FINDINGS: Three views of the left shoulder demonstrate no acute displaced fracture, subluxation or dislocation. There is joint space narrowing, subchondral sclerosis, subchondral cyst formation and osteophyte formation noted in the glenohumeral joint. IMPRESSION: 1. No acute abnormality of the left shoulder. 2. Glenohumeral joint osteoarthritis, as above. Electronically Signed   By: Trudie Reed M.D.   On: 04/29/2023 08:58   DG Chest 2 View  Result Date: 04/29/2023 CLINICAL DATA:  82 year old male with history of chest pain. EXAM: CHEST - 2 VIEW COMPARISON:  Chest x-ray 08/22/2017. FINDINGS: Lung volumes are normal. No consolidative airspace disease. No pleural effusions. No pneumothorax. No pulmonary nodule or  mass noted. Pulmonary vasculature appears engorged. Heart size is mildly enlarged. Upper mediastinal contours are within normal limits. Atherosclerotic calcifications are noted in the thoracic aorta. Atherosclerosis in the thoracic aorta. IMPRESSION: 1. Cardiomegaly with pulmonary venous congestion, but no frank pulmonary edema. 2. Aortic atherosclerosis. Electronically Signed   By: Trudie Reed M.D.   On: 04/29/2023 08:57    EKG: Independently reviewed. Afib/flutter 86bpm.  Assessment/Plan Principal Problem:   NSTEMI (non-ST elevated myocardial infarction) (HCC) Active Problems:   CLL (chronic lymphocytic leukemia) (HCC)   Subdural hematoma (HCC)   A-fib (HCC)   CKD (chronic kidney disease) stage 5, GFR less than 15 ml/min (HCC)   Iron deficiency anemia    Elevated troponin suspicious for NSTEMI -Started on heparin drip after CT head noted to be within normal limits -Per cardiology, transfer to [facility] for further evaluation to consider cardiac catheterization -2D echocardiogram -Continue telemetry monitoring -Trend troponins -Continue Coreg as noted below  Permanent atrial fibrillation -Not on anticoagulation due to prior subdural  hematoma -Monitor on telemetry -Continue Coreg 6.25 mg twice daily  Prior subdural hematoma -Status post craniotomy with evacuation 09/2016 -CT head with no acute findings -Monitor neurological status carefully while on a heparin drip  HFimpEF  -Prior LVEF 35%, but more recently 55 to 60% by echo 2018 -Has baseline dyspnea on exertion -Appears euvolemic  CKD stage IV-5 -Status post AV fistula, but not currently started on hemodialysis -Being monitored by nephrology Dr. Arrie Aran  CLL/chronic thrombocytopenia -Followed by Dr. Ellin Saba -Continue venetoclax  Chronic normocytic anemia -Continue to monitor -Iron tablets 3 times weekly  Hypertension -Continue Coreg and amlodipine  DVT prophylaxis: Heparin drip Code Status: Full Family Communication: None at bedside Disposition Plan: Admit to [facility] for further cardiology evaluation Consults called: Cardiology, Dr. Flora Lipps by EDP Admission status: Inpatient, Tele  Severity of Illness: The appropriate patient status for this patient is INPATIENT. Inpatient status is judged to be reasonable and necessary in order to provide the required intensity of service to ensure the patient's safety. The patient's presenting symptoms, physical exam findings, and initial radiographic and laboratory data in the context of their chronic comorbidities is felt to place them at high risk for further clinical deterioration. Furthermore, it is not anticipated that the patient will be medically stable for discharge from the hospital within 2 midnights of admission.   * I certify that at the point of admission it is my clinical judgment that the patient will require inpatient hospital care spanning beyond 2 midnights from the point of admission due to high intensity of service, high risk for further deterioration and high frequency of surveillance required.*   Shandell Giovanni D Hani Patnode DO [facility]  If 7PM-7AM, please contact  night-coverage [email or web address]  04/29/2023, 11:46 AM

## 2023-04-29 NOTE — Consult Note (Signed)
Pharmacy Consult Note - Anticoagulation  Pharmacy Consult for heparin Indication: chest pain/ACS  PATIENT MEASUREMENTS: Height: 6\' 2"  (188 cm) Weight: 101.2 kg (223 lb) IBW/kg (Calculated) : 82.2 HEPARIN DW (KG): 101.2  VITAL SIGNS: Temp: 97.6 F (36.4 C) (12/01 0823) Temp Source: Oral (12/01 0823) BP: 147/102 (12/01 0821) Pulse Rate: 80 (12/01 0821)  Recent Labs    04/29/23 0904 04/29/23 1029  HGB 12.9*  --   HCT 37.5*  --   PLT 100*  --   CREATININE 3.11*  --   TROPONINIHS 239* 453*    Estimated Creatinine Clearance: 22.9 mL/min (A) (by C-G formula based on SCr of 3.11 mg/dL (H)).  PAST MEDICAL HISTORY: Past Medical History:  Diagnosis Date   Arthritis    Ascending aortic aneurysm (HCC)    CKD (chronic kidney disease), stage IV (HCC)    followed by [facility]   CLL (chronic lymphocytic leukemia) (HCC)    Complication of anesthesia    "wild when I woke up "   History of kidney stones    passed    Hypertension    NICM (nonischemic cardiomyopathy) (HCC)    a. cath 2007, EF 35%, minimal CAD. b. EF normal 2018.   PAF (paroxysmal atrial fibrillation) (HCC)    Pinched nerve in neck    Pneumonia    SDH (subdural hematoma) (HCC)    Thrombocytopenia (HCC)     ASSESSMENT: 82 y.o. male with PMH including ICH, chronic lymphocytic leukemia and Afib s/p ablation (not currently on anticoagulation) is presenting with chest pain and left shoulder pain. cTn are trending up and ECG exhibits nonspecific T wave abnormalities. Head CT did not show any evidence of ICH. Patient is not on chronic anticoagulation per chart review. Pharmacy has been consulted to initiate and manage heparin intravenous infusion.  Pertinent medications: (Not in a hospital admission)   Goal(s) of therapy: Heparin level 0.3 - 0.7 units/mL Monitor platelets by anticoagulation protocol: Yes   Baseline anticoagulation labs: Recent Labs    04/29/23 0904  HGB 12.9*  PLT 100*     Date Time aPTT/HL Rate/Comment     PLAN: Give 4000 units bolus x1; then start heparin infusion at 1200 units/hour. Starting at lowest end of dosing range due to thrombocytopenia in setting of CLL Check heparin level in 8 hours, then daily once at least two levels are consecutively therapeutic. Monitor CBC daily while on heparin infusion.  Will M. Dareen Piano, PharmD Clinical Pharmacist 04/29/2023 11:16 AM

## 2023-04-29 NOTE — ED Notes (Signed)
Date and time results received: 04/29/23 1104  (use smartphrase ".now" to insert current time)  Test: Troponin Critical Value: 453   Name of Provider Notified: Dr. Eloise Harman  Orders Received? Or Actions Taken?: Orders Received - See Orders for details

## 2023-04-29 NOTE — ED Provider Notes (Signed)
[place] EMERGENCY DEPARTMENT AT [facility] Provider Note   CSN: [id number] Arrival date & time: 04/29/23  4098     History  Chief Complaint  Patient presents with   Chest Pain    Andrew [name] is a 82 y.o. male.  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  Patient reports that yesterday he drank something and started experiencing chest soreness and left shoulder soreness.  Lasted for approximately 30 minutes and resolved.  This morning at 6 AM drank some water and had the same thing happen.  Last for 15 minutes this morning.  Took some Tylenol and it resolved.  Is concerned about possible heart problem so decided to come in.  Denies any diaphoresis, vomiting, shortness of breath, or any other symptoms of the pain.  Was not exertional.  Did not worsen when he was moving his shoulder.  Takes venetoclax daily for his CLL.  Smoked previously decades ago.  Father had MI at the age of 83.       Home Medications Prior to Admission medications   Medication Sig Start Date End Date Taking? Authorizing Provider  amLODipine (NORVASC) 5 MG tablet Take 5 mg by mouth daily.    [provider]  carvedilol (COREG) 6.25 MG tablet Take 1 tablet (6.25 mg total) by mouth 2 (two) times daily. 05/16/18   Antoine Poche, MD  Cholecalciferol (VITAMIN D3) 25 MCG (1000 UT) CAPS Take 1 capsule by mouth daily. 05/18/20   [provider]  Cyanocobalamin (VITAMIN B12) 1000 MCG TBCR Take 1,000 mcg by mouth daily.  06/30/19   [provider]  ferrous sulfate 325 (65 FE) MG EC tablet Take 325 mg by mouth 3 (three) times a week.    [provider]  Multiple Vitamin (MULITIVITAMIN WITH MINERALS) TABS Take 1 tablet by mouth daily.    [provider]  omeprazole (PRILOSEC) 20 MG capsule 1 PO 30 MINS PRIOR  TO BREAKFAST. Patient taking differently: Take 20 mg by mouth daily. 03/13/19   Letta Median, PA-C  sodium bicarbonate 650 MG tablet Take 650 mg by mouth 3 (three) times daily. 10/30/22   [provider]  tamsulosin (FLOMAX) 0.4 MG CAPS capsule Take 0.4 mg by mouth daily.  07/19/17   [provider]  venetoclax (VENCLEXTA) 100 MG tablet Take 3 tablets (300 mg total) by mouth daily. 04/24/23   Doreatha Massed, MD      Allergies    Patient has no known allergies.    Review of Systems   Review of Systems  Physical Exam Updated Vital Signs BP (!) 147/102   Pulse 80   Temp 97.6 F (36.4 C) (Oral)   Resp 12   Ht 6\' 2"  (1.88 m)   Wt 101.2 kg   SpO2 97%   BMI 28.63 kg/m  Physical Exam Vitals and nursing note reviewed.  Constitutional:      General: He is not in acute distress.    Appearance: He is well-developed.  HENT:     Head: Normocephalic and atraumatic.     Right Ear: External ear normal.     Left Ear: External ear normal.     Nose: Nose normal.  Eyes:     Extraocular Movements: Extraocular movements intact.     Conjunctiva/sclera: Conjunctivae normal.     Pupils: Pupils  are equal, round, and reactive to light.  Cardiovascular:     Rate and Rhythm: Normal rate and regular rhythm.     Heart sounds: Normal heart sounds.     Comments: Fistula in left upper extremity with bruit and thrill.  Chest pain not reproducible.  No rashes on chest. Pulmonary:     Effort: Pulmonary effort is normal. No respiratory distress.     Breath sounds: Normal breath sounds.  Musculoskeletal:     Cervical back: Normal range of motion and neck supple.     Right lower leg: No edema.     Left lower leg: No edema.     Comments: No shoulder tenderness to palpation.  Able to range shoulder fully without pain.  Skin:    General: Skin is warm and dry.  Neurological:     Mental Status: He is alert. Mental status is at baseline.  Psychiatric:        Mood and Affect: Mood  normal.        Behavior: Behavior normal.     ED Results / Procedures / Treatments   Labs (all labs ordered are listed, but only abnormal results are displayed) Labs Reviewed  BASIC METABOLIC PANEL - Abnormal; Notable for the following components:      Result Value   Glucose, Bld 141 (*)    BUN 36 (*)    Creatinine, Ser 3.11 (*)    GFR, Estimated 19 (*)    All other components within normal limits  CBC - Abnormal; Notable for the following components:   WBC 3.1 (*)    RBC 3.68 (*)    Hemoglobin 12.9 (*)    HCT 37.5 (*)    MCV 101.9 (*)    MCH 35.1 (*)    Platelets 100 (*)    All other components within normal limits  TROPONIN I (HIGH SENSITIVITY) - Abnormal; Notable for the following components:   Troponin I (High Sensitivity) 239 (*)    All other components within normal limits  TROPONIN I (HIGH SENSITIVITY)    EKG EKG Interpretation Date/Time:  Sunday April 29 2023 08:19:58 EST Ventricular Rate:  86 PR Interval:    QRS Duration:  117 QT Interval:  383 QTC Calculation: 459 R Axis:   256  Text Interpretation: Atrial fibrillation vs atrial flutter with variable conduction Nonspecific IVCD with LAD Borderline low voltage, extremity leads Nonspecific T abnormalities, lateral leads No significant change since last tracing Confirmed by Vonita Moss [phone]) on 04/29/2023 8:43:12 AM  Radiology DG Shoulder Left  Result Date: 04/29/2023 CLINICAL DATA:  82 year old male with history of left shoulder pain. EXAM: LEFT SHOULDER - 2+ VIEW COMPARISON:  No priors. FINDINGS: Three views of the left shoulder demonstrate no acute displaced fracture, subluxation or dislocation. There is joint space narrowing, subchondral sclerosis, subchondral cyst formation and osteophyte formation noted in the glenohumeral joint. IMPRESSION: 1. No acute abnormality of the left shoulder. 2. Glenohumeral joint osteoarthritis, as above. Electronically Signed   By: Trudie Reed M.D.   On: 04/29/2023  08:58   DG Chest 2 View  Result Date: 04/29/2023 CLINICAL DATA:  82 year old male with history of chest pain. EXAM: CHEST - 2 VIEW COMPARISON:  Chest x-ray 08/22/2017. FINDINGS: Lung volumes are normal. No consolidative airspace disease. No pleural effusions. No pneumothorax. No pulmonary nodule or mass noted. Pulmonary vasculature appears engorged. Heart size is mildly enlarged. Upper mediastinal contours are within normal limits. Atherosclerotic calcifications are noted in the thoracic aorta. Atherosclerosis in the  thoracic aorta. IMPRESSION: 1. Cardiomegaly with pulmonary venous congestion, but no frank pulmonary edema. 2. Aortic atherosclerosis. Electronically Signed   By: Trudie Reed M.D.   On: 04/29/2023 08:57    Procedures Procedures    Medications Ordered in ED Medications  aspirin chewable tablet 162 mg (162 mg Oral Given 04/29/23 0948)    ED Course/ Medical Decision Making/ A&P Clinical Course as of 04/29/23 1104  Sun Apr 29, 2023  0924 CBC(!) Cell counts appear to be at baseline [RP]  1000 Creatinine(!): 3.11 At baseline [RP]  1013 Dr Flora Lipps from cardiology consulted and recommends starting the patient on a heparin drip and admitting to [facility] under hospitalist for serial troponins and echo.  They will discuss possible heart catheterization with the patient [RP]  1026 Dr Sherryll Burger from hospitalist to admit the patient to [facility] [RP]    Clinical Course User Index [RP] Rondel Baton, MD                                 Medical Decision Making Amount and/or Complexity of Data Reviewed Labs: ordered. Decision-making details documented in ED Course. Radiology: ordered.  Risk OTC drugs. Decision regarding hospitalization.   SHAHZAD WRAGG is a 82 y.o. 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   Initial Ddx:  Esophageal spasm, dysphagia, MI, MSK pain  MDM/Course:  Patient presents emergency department chest discomfort.  Happened twice and appears to be in the setting of drinking water and did spontaneously resolved.  Currently asymptomatic.  Overall well-appearing.  No pain with ranging his shoulder tenderness palpation that would suggest MSK pain.  Initially was thinking that he likely had an esophageal cause of his symptoms but had troponin sent that was elevated.  Somewhat difficult to interpret given the setting of his renal function so cardiology was consulted.  They recommended starting him on heparin and admitting him to [facility] for additional evaluation.  With his history of ICH did obtain a CT of the head prior to starting heparin which did not show evidence of ICH.  He was then admitted to hospitalist service.   This patient presents to the ED for concern of complaints listed in HPI, this involves an extensive number of treatment options, and is a complaint that carries with it a high risk of complications and morbidity. Disposition including potential need for admission considered.   Dispo: Admit to Floor  Additional history obtained from spouse Records reviewed Outpatient Clinic Notes The following labs were independently interpreted: Chemistry and show CKD I independently reviewed the following imaging with scope of interpretation limited to determining acute life threatening conditions related to emergency care: Chest x-ray and agree with the radiologist interpretation with the following exceptions: none I personally reviewed and interpreted cardiac monitoring: normal sinus rhythm  I personally reviewed and interpreted the pt's EKG: see above for interpretation  I have reviewed the patients home medications and made adjustments as needed Consults: Cardiology and Hospitalist Social Determinants of health:  Elderly  Portions of this note  were generated with Scientist, clinical (histocompatibility and immunogenetics). Dictation errors may occur despite best attempts at proofreading.    CRITICAL CARE Performed by: Rondel Baton   Total critical care time: 45 minutes  Critical care  time was exclusive of separately billable procedures and treating other patients.  Critical care was necessary to treat or prevent imminent or life-threatening deterioration.  Critical care was time spent personally by me on the following activities: development of treatment plan with patient and/or surrogate as well as nursing, discussions with consultants, evaluation of patient's response to treatment, examination of patient, obtaining history from patient or surrogate, ordering and performing treatments and interventions, ordering and review of laboratory studies, ordering and review of radiographic studies, pulse oximetry and re-evaluation of patient's condition.   Final Clinical Impression(s) / ED Diagnoses Final diagnoses:  Chest pain, unspecified type  NSTEMI (non-ST elevated myocardial infarction) [facility])    Rx / DC Orders ED Discharge Orders     None         Rondel Baton, MD 04/29/23 1104

## 2023-04-29 NOTE — Progress Notes (Signed)
  Echocardiogram 2D Echocardiogram has been performed.  Andrew [name] 04/29/2023, 3:38 PM

## 2023-04-29 NOTE — ED Triage Notes (Signed)
Pt arrived POV with clo chest pain while he was drinking some water this morning and then his left shoulder pain began. Pt states chest no longer hurt but his left shoulder still hurts. Pt denies n/v.

## 2023-04-29 NOTE — ED Notes (Signed)
Spoke with flow manager at this time to get DR. Sherryll Burger to return our pages pt has a critical value . C-link en route at this time. Misty Stanley

## 2023-04-29 NOTE — Plan of Care (Signed)

## 2023-04-29 NOTE — Consult Note (Signed)
Pharmacy Consult Note - Anticoagulation  Pharmacy Consult for heparin Indication: chest pain/ACS  PATIENT MEASUREMENTS: Height: 6\' 2"  (188 cm) Weight: 101.2 kg (223 lb) IBW/kg (Calculated) : 82.2 HEPARIN DW (KG): 101.2  VITAL SIGNS: Temp: 97.6 F (36.4 C) (12/01 2005) Temp Source: Oral (12/01 2005) BP: 123/77 (12/01 2005) Pulse Rate: 76 (12/01 2005)  Recent Labs    04/29/23 0904 04/29/23 1029 04/29/23 1111 04/29/23 1432 04/29/23 2040  HGB 12.9*  --   --   --   --   HCT 37.5*  --   --   --   --   PLT 100*  --   --   --   --   APTT  --   --  25  --   --   LABPROT  --   --  14.0  --   --   INR  --   --  1.1  --   --   HEPARINUNFRC  --   --   --   --  0.23*  CREATININE 3.11*  --   --   --   --   TROPONINIHS 239*   < >  --  3,153*  --    < > = values in this interval not displayed.    Estimated Creatinine Clearance: 22.9 mL/min (A) (by C-G formula based on SCr of 3.11 mg/dL (H)).  PAST MEDICAL HISTORY: Past Medical History:  Diagnosis Date   Arthritis    Ascending aortic aneurysm (HCC)    CKD (chronic kidney disease), stage IV (HCC)    followed by [facility]   CLL (chronic lymphocytic leukemia) (HCC)    Complication of anesthesia    "wild when I woke up "   History of kidney stones    passed    Hypertension    NICM (nonischemic cardiomyopathy) (HCC)    a. cath 2007, EF 35%, minimal CAD. b. EF normal 2018.   PAF (paroxysmal atrial fibrillation) (HCC)    Pinched nerve in neck    Pneumonia    SDH (subdural hematoma) (HCC)    Thrombocytopenia (HCC)     ASSESSMENT: 82 y.o. male with PMH including ICH, chronic lymphocytic leukemia and Afib s/p ablation (not currently on anticoagulation) is presenting with chest pain and left shoulder pain. cTn are trending up and ECG exhibits nonspecific T wave abnormalities. Head CT did not show any evidence of ICH. Patient is not on chronic anticoagulation per chart review. Pharmacy has been consulted to initiate and manage  heparin intravenous infusion. Heparin drip 1200 uts/hr with heparin level 0.21 slightly < goal.     Goal(s) of therapy: Heparin level 0.3 - 0.7 units/mL Monitor platelets by anticoagulation protocol: Yes    PLAN: Increase heparin 1400uts/hr Daily CBC and heparin level Monitor s/s bleeding   Leota Sauers Pharm.D. CPP, BCPS Clinical Pharmacist [phone] 04/29/2023 9:24 PM

## 2023-04-29 NOTE — ED Notes (Signed)
Date and time results received: 04/29/23 0940 (use smartphrase ".now" to insert current time)  Test: Troponin Critical Value: 239  Name of Provider Notified: Dr. Eloise Harman  Orders Received? Or Actions Taken?: Orders Received - See Orders for details

## 2023-04-30 DIAGNOSIS — I5020 Unspecified systolic (congestive) heart failure: Secondary | ICD-10-CM | POA: Diagnosis not present

## 2023-04-30 DIAGNOSIS — N184 Chronic kidney disease, stage 4 (severe): Secondary | ICD-10-CM

## 2023-04-30 DIAGNOSIS — I482 Chronic atrial fibrillation, unspecified: Secondary | ICD-10-CM

## 2023-04-30 DIAGNOSIS — I214 Non-ST elevation (NSTEMI) myocardial infarction: Secondary | ICD-10-CM | POA: Diagnosis not present

## 2023-04-30 LAB — LIPID PANEL
Cholesterol: 148 mg/dL (ref 0–200)
HDL: 27 mg/dL — ABNORMAL LOW (ref >40)
LDL Cholesterol: 102 mg/dL — ABNORMAL HIGH (ref 0–99)
Total CHOL/HDL Ratio: 5.5 {ratio}
Triglycerides: 95 mg/dL (ref <150)
VLDL: 19 mg/dL (ref 0–40)

## 2023-04-30 LAB — CBC
HCT: 33.6 % — ABNORMAL LOW (ref 39.0–52.0)
Hemoglobin: 11.6 g/dL — ABNORMAL LOW (ref 13.0–17.0)
MCH: 34.6 pg — ABNORMAL HIGH (ref 26.0–34.0)
MCHC: 34.5 g/dL (ref 30.0–36.0)
MCV: 100.3 fL — ABNORMAL HIGH (ref 80.0–100.0)
Platelets: 83 10*3/uL — ABNORMAL LOW (ref 150–400)
RBC: 3.35 MIL/uL — ABNORMAL LOW (ref 4.22–5.81)
RDW: 12.7 % (ref 11.5–15.5)
WBC: 3 10*3/uL — ABNORMAL LOW (ref 4.0–10.5)
nRBC: 0 % (ref 0.0–0.2)

## 2023-04-30 LAB — HEPARIN LEVEL (UNFRACTIONATED)
Heparin Unfractionated: 0.3 [IU]/mL (ref 0.30–0.70)
Heparin Unfractionated: 0.42 [IU]/mL (ref 0.30–0.70)

## 2023-04-30 LAB — BASIC METABOLIC PANEL
Anion gap: 7 (ref 5–15)
BUN: 37 mg/dL — ABNORMAL HIGH (ref 8–23)
CO2: 22 mmol/L (ref 22–32)
Calcium: 8.9 mg/dL (ref 8.9–10.3)
Chloride: 110 mmol/L (ref 98–111)
Creatinine, Ser: 3.38 mg/dL — ABNORMAL HIGH (ref 0.61–1.24)
GFR, Estimated: 17 mL/min — ABNORMAL LOW (ref >60)
Glucose, Bld: 106 mg/dL — ABNORMAL HIGH (ref 70–99)
Potassium: 3.8 mmol/L (ref 3.5–5.1)
Sodium: 139 mmol/L (ref 135–145)

## 2023-04-30 LAB — MAGNESIUM: Magnesium: 2.2 mg/dL (ref 1.7–2.4)

## 2023-04-30 LAB — TROPONIN I (HIGH SENSITIVITY)
Troponin I (High Sensitivity): 7531 ng/L (ref <18)
Troponin I (High Sensitivity): 6501 ng/L (ref <18)

## 2023-04-30 LAB — TSH: TSH: 0.552 u[IU]/mL (ref 0.350–4.500)

## 2023-04-30 MED ORDER — ASPIRIN 81 MG PO CHEW
81.0000 mg | CHEWABLE_TABLET | ORAL | Status: AC
Start: 2023-05-01 — End: 2023-05-02
  Administered 2023-05-01: 81 mg via ORAL
  Filled 2023-04-30: qty 1

## 2023-04-30 MED ORDER — VENETOCLAX 100 MG PO TABS
300.0000 mg | ORAL_TABLET | Freq: Every day | ORAL | Status: DC
  Filled 2023-04-30: qty 3

## 2023-04-30 MED ORDER — SODIUM CHLORIDE 0.9 % WEIGHT BASED INFUSION
1.0000 mL/kg/h | INTRAVENOUS | Status: DC
  Administered 2023-04-30 – 2023-05-01 (×2): 1 mL/kg/h via INTRAVENOUS

## 2023-04-30 MED ORDER — VENETOCLAX 100 MG PO TABS
300.0000 mg | ORAL_TABLET | Freq: Every day | ORAL | Status: DC
  Administered 2023-04-30 – 2023-05-01 (×2): 300 mg via ORAL
  Filled 2023-04-30 (×4): qty 3

## 2023-04-30 NOTE — Plan of Care (Signed)
  Problem: Education: Goal: Knowledge of General Education information will improve Description: Including pain rating scale, medication(s)/side effects and non-pharmacologic comfort measures Outcome: Progressing   Problem: Clinical Measurements: Goal: Will remain free from infection Outcome: Progressing   Problem: Clinical Measurements: Goal: Respiratory complications will improve Outcome: Progressing   Problem: Pain Management: Goal: General experience of comfort will improve Outcome: Progressing

## 2023-04-30 NOTE — Progress Notes (Signed)
PROGRESS NOTE  CHIKE MAZZOTTI    DOB: 1940/12/25, 82 y.o.  MWU:[id number]    Code Status: Full Code   DOA: 04/29/2023   LOS: 1   Brief hospital course  MIA SEEGARS is a 82 y.o. male with a PMH significant for stage IV CLL, CKD stage IV, normocytic anemia, permanent atrial fibrillation not on anticoagulation due to prior subdural hematoma and chronic thrombocytopenia, HFpEF, ascending aortic aneurysm, and hypertension.  They presented from home to the ED on 04/29/2023 with chest discomfort x 2 days and radiated to the left shoulder. Found to have NSTEMI and started on heparin gtt and transferred from [facility] to [facility].   In the ED, it was found that they had stable vital signs.  Significant findings included Hemoglobin 12.9 and platelet count of 100,000, creatinine at baseline with 3.11 and troponin noted to be 239. Chest x-ray showing some mild cardiomegaly and pulmonary vascular congestion and shoulder x-ray negative for any acute findings. CT head with no acute findings.  They were initially treated with heparin gtt and aspirin   Patient was admitted to medicine service for further workup and management of NSTEMI as outlined in detail below.  04/30/23 -remains stable and chest pain free on heparin gtt. Awaiting cardiology evaluation today. Renal function is at baseline.  Assessment & Plan  Principal Problem:   NSTEMI (non-ST elevated myocardial infarction) (HCC) Active Problems:   CLL (chronic lymphocytic leukemia) (HCC)   Subdural hematoma (HCC)   A-fib (HCC)   CKD (chronic kidney disease) stage 5, GFR less than 15 ml/min (HCC)   Iron deficiency anemia  Elevated troponin suspicious for NSTEMI- chest pain free currently. 2D echocardiogram completed with reduced EF compared to most recent in 2018. (EF 25-30%). Troponins peaked at 7,531 -Started on heparin drip after CT head noted to be within normal limits -cardiology consulted, appreciate your recs -likely cath tomorrow -Continue  telemetry monitoring -Continue Coreg as noted below   Permanent atrial fibrillation -Not on anticoagulation due to prior subdural hematoma -Monitor on telemetry -Continue Coreg 6.25 mg twice daily   Prior subdural hematoma-Status post craniotomy with evacuation 09/2016. At neurological baseline currently without focal changes -CT head with no acute findings -Monitor neurological status carefully while on a heparin drip   HFrEF  HTN -Prior LVEF 35%, but more recently 55 to 60% by echo 2018. Repeat echo on admission shows RF 25-30%.  -Has baseline dyspnea on exertion -Appears euvolemic - continue coreg, imdur, hydralazine   CKD stage IV-5- Cr is 3.3 today which is his baseline. Not currently needing HD. UOP >1.3L overnight. Status post AV fistula, but not currently started on hemodialysis. -Being monitored by nephrology, Dr. Arrie Aran outpatient.  - monitor Cr am - IV fluids prior to cath and minimize contrast during cath as able.  - post-cath BMP and consult nephrology if significant increase - continue bicarb   CLL/chronic thrombocytopenia -Followed by Dr. Ellin Saba -Continue venetoclax   Chronic normocytic anemia -Continue to monitor -Iron tablets 3 times weekly    Body mass index is 29.13 kg/m.  VTE ppx: heparin gtt  Diet:     Diet   Diet Heart Room service appropriate? Yes; Fluid consistency: Thin   Consultants: Cardiology   Subjective 04/30/23    Pt reports feeling well. Denies chest pain, nausea, diaphoresis. Denies leg swelling   Objective   Vitals:   04/30/23 0140 04/30/23 0621 04/30/23 0740 04/30/23 1144  BP: 120/72 117/71 113/70 115/70  Pulse:  68  Resp:  18 18 18   Temp: 97.6 F (36.4 C) 97.7 F (36.5 C) 97.7 F (36.5 C) 98.1 F (36.7 C)  TempSrc: Oral Oral Oral Oral  SpO2: 97% 96% 98% 97%  Weight:  102.9 kg    Height:        Intake/Output Summary (Last 24 hours) at 04/30/2023 1318 Last data filed at 04/30/2023 0600 Gross per 24 hour   Intake 649.39 ml  Output 850 ml  Net -200.61 ml   Filed Weights   04/29/23 0820 04/30/23 0621  Weight: 101.2 kg 102.9 kg    Physical Exam:  General: awake, alert, NAD HEENT: atraumatic, clear conjunctiva, anicteric sclera, MMM, hearing grossly normal Respiratory: normal respiratory effort. Cardiovascular: quick capillary refill, normal S1/S2, RRR, no JVD, murmurs Gastrointestinal: soft, NT, ND Nervous: A&O x3. no gross focal neurologic deficits, normal speech Extremities: moves all equally, no edema, normal tone Skin: dry, intact, normal temperature, normal color. No rashes, lesions or ulcers on exposed skin Psychiatry: normal mood, congruent affect  Labs   I have personally reviewed the following labs and imaging studies CBC    Component Value Date/Time   WBC 3.0 (L) 04/30/2023 0342   RBC 3.35 (L) 04/30/2023 0342   HGB 11.6 (L) 04/30/2023 0342   HCT 33.6 (L) 04/30/2023 0342   PLT 83 (L) 04/30/2023 0342   MCV 100.3 (H) 04/30/2023 0342   MCH 34.6 (H) 04/30/2023 0342   MCHC 34.5 04/30/2023 0342   RDW 12.7 04/30/2023 0342   LYMPHSABS 0.4 (L) 01/09/2023 0850   MONOABS 0.4 01/09/2023 0850   EOSABS 0.0 01/09/2023 0850   BASOSABS 0.0 01/09/2023 0850      Latest Ref Rng & Units 04/30/2023    3:42 AM 04/29/2023    9:04 AM 01/09/2023    8:50 AM  BMP  Glucose 70 - 99 mg/dL 562  130  865   BUN 8 - 23 mg/dL 37  36  34   Creatinine 0.61 - 1.24 mg/dL 7.84  6.96  2.95   Sodium 135 - 145 mmol/L 139  140  137   Potassium 3.5 - 5.1 mmol/L 3.8  4.1  4.1   Chloride 98 - 111 mmol/L 110  107  105   CO2 22 - 32 mmol/L 22  23  24    Calcium 8.9 - 10.3 mg/dL 8.9  9.1  9.0     ECHOCARDIOGRAM COMPLETE  Result Date: 04/29/2023    ECHOCARDIOGRAM REPORT   Patient Name:   KHYAIR BROOKBANK Date of Exam: 04/29/2023 Medical Rec #:  [id number]      Height:       74.0 in Accession #:    [id number]     Weight:       223.0 lb Date of Birth:  1940-11-21     BSA:          2.276 m Patient Age:    82 years        BP:           140/94 mmHg Patient Gender: M              HR:           68 bpm. [facility] Procedure: 2D Echo, Cardiac Doppler, Color Doppler and Intracardiac            Opacification Agent Indications:    R07.9* Chest pain, unspecified  History:        Patient has prior history of Echocardiogram examinations,  most                 recent 08/14/2016. Previous Myocardial Infarction, Abnormal ECG,                 Arrythmias:Atrial Fibrillation, Signs/Symptoms:Chest Pain; Risk                 Factors:Hypertension. Leukemia.  Sonographer:    Sheralyn Boatman RDCS Referring Phys: [id number] Lamont Dowdy [facility]  Sonographer Comments: Technically difficult study due to poor echo windows. IMPRESSIONS  1. Mid to distal deptum, apical anterior segment, and apex are akinetic. No LV thrombus on contrast imaging. Other segments are hypokinetic. Left ventricular ejection fraction, by estimation, is 25 to 30%. The left ventricle has severely decreased function. The left ventricle demonstrates regional wall motion abnormalities (see scoring diagram/findings for description). The left ventricular internal cavity size was mildly dilated. There is mild left ventricular hypertrophy. Left ventricular diastolic parameters are indeterminate.  2. Right ventricular systolic function is normal. The right ventricular size is moderately enlarged. Tricuspid regurgitation signal is inadequate for assessing PA pressure.  3. Left atrial size was moderately dilated.  4. Right atrial size was severely dilated.  5. The mitral valve is normal in structure. Mild mitral valve regurgitation. No evidence of mitral stenosis.  6. The aortic valve is tricuspid. There is mild calcification of the aortic valve. There is mild thickening of the aortic valve. Aortic valve regurgitation is trivial. Aortic valve sclerosis is present, with no evidence of aortic valve stenosis.  7. Aortic dilatation noted. There is moderate dilatation of the ascending aorta,  measuring 44 mm.  8. The inferior vena cava is dilated in size with >50% respiratory variability, suggesting right atrial pressure of 8 mmHg. Comparison(s): The left ventricular function is significantly worse. Conclusion(s)/Recommendation(s): Findings consistent with ischemic cardiomyopathy. No left ventricular mural or apical thrombus/thrombi. FINDINGS  Left Ventricle: Mid to distal deptum, apical anterior segment, and apex are akinetic. No LV thrombus on contrast imaging. Other segments are hypokinetic. Left ventricular ejection fraction, by estimation, is 25 to 30%. The left ventricle has severely decreased function. The left ventricle demonstrates regional wall motion abnormalities. Definity contrast agent was given IV to delineate the left ventricular endocardial borders. The left ventricular internal cavity size was mildly dilated. There is mild left ventricular hypertrophy. Left ventricular diastolic function could not be evaluated due to atrial fibrillation. Left ventricular diastolic parameters are indeterminate.  LV Wall Scoring: The mid and distal anterior septum, apical anterior segment, and apex are akinetic. Right Ventricle: The right ventricular size is moderately enlarged. No increase in right ventricular wall thickness. Right ventricular systolic function is normal. Tricuspid regurgitation signal is inadequate for assessing PA pressure. Left Atrium: Left atrial size was moderately dilated. Right Atrium: Right atrial size was severely dilated. Pericardium: There is no evidence of pericardial effusion. Mitral Valve: The mitral valve is normal in structure. Mild mitral valve regurgitation. No evidence of mitral valve stenosis. Tricuspid Valve: The tricuspid valve is normal in structure. Tricuspid valve regurgitation is mild . No evidence of tricuspid stenosis. Aortic Valve: The aortic valve is tricuspid. There is mild calcification of the aortic valve. There is mild thickening of the aortic valve.  Aortic valve regurgitation is trivial. Aortic valve sclerosis is present, with no evidence of aortic valve stenosis. Pulmonic Valve: The pulmonic valve was grossly normal. Pulmonic valve regurgitation is mild. No evidence of pulmonic stenosis. Aorta: Aortic dilatation noted and the aortic root is normal in size and structure.  There is moderate dilatation of the ascending aorta, measuring 44 mm. Venous: The inferior vena cava is dilated in size with greater than 50% respiratory variability, suggesting right atrial pressure of 8 mmHg. IAS/Shunts: No atrial level shunt detected by color flow Doppler.  LEFT VENTRICLE PLAX 2D LVIDd:         5.60 cm LVIDs:         4.10 cm LV PW:         1.40 cm LV IVS:        1.20 cm LVOT diam:     2.55 cm LV SV:         81 LV SV Index:   36 LVOT Area:     5.11 cm  LV Volumes (MOD) LV vol d, MOD A2C: 152.0 ml LV vol d, MOD A4C: 160.5 ml LV vol s, MOD A2C: 118.5 ml LV vol s, MOD A4C: 97.3 ml LV SV MOD A2C:     33.5 ml LV SV MOD A4C:     160.5 ml LV SV MOD BP:      48.0 ml RIGHT VENTRICLE             IVC RV S prime:     13.20 cm/s  IVC diam: 2.60 cm TAPSE (M-mode): 1.7 cm LEFT ATRIUM              Index        RIGHT ATRIUM           Index LA diam:        4.30 cm  1.89 cm/m   RA Area:     29.60 cm LA Vol (A2C):   159.0 ml 69.85 ml/m  RA Volume:   98.20 ml  43.14 ml/m LA Vol (A4C):   103.0 ml 45.25 ml/m LA Biplane Vol: 129.0 ml 56.67 ml/m  AORTIC VALVE             PULMONIC VALVE LVOT Vmax:   86.40 cm/s  PR End Diast Vel: 1.33 msec LVOT Vmean:  51.400 cm/s LVOT VTI:    0.159 m  AORTA Ao Root diam: 3.90 cm Ao Asc diam:  4.40 cm MITRAL VALVE MV Area (PHT): 4.49 cm     SHUNTS MV Decel Time: 169 msec     Systemic VTI:  0.16 m MV E velocity: 105.00 cm/s  Systemic Diam: 2.55 cm Lennie Odor MD Electronically signed by Lennie Odor MD Signature Date/Time: 04/29/2023/3:50:35 PM    Final    CT Head Wo Contrast  Result Date: 04/29/2023 CLINICAL DATA:  Chest pain. Personal history of  intracranial hemorrhage. Need to start heparin. EXAM: CT HEAD WITHOUT CONTRAST TECHNIQUE: Contiguous axial images were obtained from the base of the skull through the vertex without intravenous contrast. RADIATION DOSE REDUCTION: This exam was performed according to the departmental dose-optimization program which includes automated exposure control, adjustment of the mA and/or kV according to patient size and/or use of iterative reconstruction technique. COMPARISON:  CT head without contrast 12/11/2016 FINDINGS: Brain: Mild atrophy and white matter changes are stable. The ventricles are of normal size. No significant extraaxial fluid collection is present. The brainstem and cerebellum are within normal limits. Craniotomy noted. No residual or recurrent intracranial hemorrhage is present. Vascular: No hyperdense vessel or unexpected calcification. Skull: Right frontal parietal craniotomy noted. Calvarium is otherwise intact. No significant extracranial soft tissue lesion is present. Sinuses/Orbits: The paranasal sinuses and mastoid air cells are clear. Bilateral lens replacements are noted. Globes and orbits are  otherwise unremarkable. IMPRESSION: 1. No acute intracranial abnormality or significant interval change. 2. Stable atrophy and white matter disease. This likely reflects the sequela of chronic microvascular ischemia. 3. Right frontal parietal craniotomy without residual or recurrent intracranial hemorrhage. These results were called by telephone at the time of interpretation on 04/29/2023 at 10:48 am to provider Vonita Moss , who verbally acknowledged these results. Electronically Signed   By: Marin Roberts M.D.   On: 04/29/2023 11:09   DG Shoulder Left  Result Date: 04/29/2023 CLINICAL DATA:  82 year old male with history of left shoulder pain. EXAM: LEFT SHOULDER - 2+ VIEW COMPARISON:  No priors. FINDINGS: Three views of the left shoulder demonstrate no acute displaced fracture, subluxation  or dislocation. There is joint space narrowing, subchondral sclerosis, subchondral cyst formation and osteophyte formation noted in the glenohumeral joint. IMPRESSION: 1. No acute abnormality of the left shoulder. 2. Glenohumeral joint osteoarthritis, as above. Electronically Signed   By: Trudie Reed M.D.   On: 04/29/2023 08:58   DG Chest 2 View  Result Date: 04/29/2023 CLINICAL DATA:  82 year old male with history of chest pain. EXAM: CHEST - 2 VIEW COMPARISON:  Chest x-ray 08/22/2017. FINDINGS: Lung volumes are normal. No consolidative airspace disease. No pleural effusions. No pneumothorax. No pulmonary nodule or mass noted. Pulmonary vasculature appears engorged. Heart size is mildly enlarged. Upper mediastinal contours are within normal limits. Atherosclerotic calcifications are noted in the thoracic aorta. Atherosclerosis in the thoracic aorta. IMPRESSION: 1. Cardiomegaly with pulmonary venous congestion, but no frank pulmonary edema. 2. Aortic atherosclerosis. Electronically Signed   By: Trudie Reed M.D.   On: 04/29/2023 08:57    Disposition Plan & Communication  Patient status: Inpatient  Admitted From: Home Planned disposition location: Home Anticipated discharge date: 12/4 pending cardiology clearance   Family Communication: wife and daughters at bedside    Author: Leeroy Bock, DO [facility] 04/30/2023, 1:18 PM   Available by Epic secure chat 7AM-7PM. If 7PM-7AM, please contact night-coverage.  [facility] contact information found on [email or web address].

## 2023-04-30 NOTE — Progress Notes (Signed)
[facility] night cross cover note:   I was notified by RN of the patient's elevated troponin, now 7,531, slightly higher than most recent prior of 6,724.  No report of any associated chest pain at this time.  Per my chart review, the patient is here with NSTEMI, has been consulted by cardiology, who anticipate cardiac catheterization tomorrow.     Newton Pigg, DO Hospitalist

## 2023-04-30 NOTE — Progress Notes (Incomplete)
Heart Failure Nurse Navigator Progress Note  PCP: Benita Stabile, MD PCP-Cardiologist: *** Admission Diagnosis: *** Admitted from: ***  Presentation:   Andrew [name] presented with ***  ECHO/ LVEF: ***  Clinical Course:  Past Medical History:  Diagnosis Date   Arthritis    Ascending aortic aneurysm (HCC)    CKD (chronic kidney disease), stage IV (HCC)    followed by [facility]   CLL (chronic lymphocytic leukemia) (HCC)    Complication of anesthesia    "wild when I woke up "   History of kidney stones    passed    Hypertension    NICM (nonischemic cardiomyopathy) (HCC)    a. cath 2007, EF 35%, minimal CAD. b. EF normal 2018.   PAF (paroxysmal atrial fibrillation) (HCC)    Pinched nerve in neck    Pneumonia    SDH (subdural hematoma) (HCC)    Thrombocytopenia (HCC)      Social History   Socioeconomic History   Marital status: Married    Spouse name: Not on file   Number of children: Not on file   Years of education: Not on file   Highest education level: Not on file  Occupational History   Not on file  Tobacco Use   Smoking status: Former    Current packs/day: 0.00    Average packs/day: 1 pack/day for 15.0 years (15.0 ttl pk-yrs)    Types: Cigarettes    Start date: 01/26/1956    Quit date: 01/26/1971    Years since quitting: 52.2   Smokeless tobacco: Never  Vaping Use   Vaping status: Never Used  Substance and Sexual Activity   Alcohol use: No   Drug use: No   Sexual activity: Not on file  Other Topics Concern   Not on file  Social History Narrative   Not on file   Social Determinants of Health   Financial Resource Strain: Low Risk  (08/30/2020)   Overall Financial Resource Strain (CARDIA)    Difficulty of Paying Living Expenses: Not hard at all  Food Insecurity: No Food Insecurity (04/29/2023)   Hunger Vital Sign    Worried About Running Out of Food in the Last Year: Never true    Ran Out of Food in the Last Year: Never true  Transportation  Needs: No Transportation Needs (04/29/2023)   PRAPARE - Administrator, Civil Service (Medical): No    Lack of Transportation (Non-Medical): No  Physical Activity: Inactive (08/30/2020)   Exercise Vital Sign    Days of Exercise per Week: 0 days    Minutes of Exercise per Session: 0 min  Stress: No Stress Concern Present (08/30/2020)   [employer] of Occupational Health - Occupational Stress Questionnaire    Feeling of Stress : Not at all  Social Connections: Moderately Integrated (08/30/2020)   Social Connection and Isolation Panel [NHANES]    Frequency of Communication with Friends and Family: More than three times a week    Frequency of Social Gatherings with Friends and Family: More than three times a week    Attends Religious Services: More than 4 times per year    Active Member of [employer] or Organizations: No    Attends Banker Meetings: Never    Marital Status: Married   Water engineer and Provision:  Detailed education and instructions provided on heart failure disease management including the following:  Signs and symptoms of Heart Failure When to call the physician Importance of daily weights  Low sodium diet Fluid restriction Medication management Anticipated future follow-up appointments  Patient education given on each of the above topics.  Patient acknowledges understanding via teach back method and acceptance of all instructions.  Education Materials:  "Living Better With Heart Failure" Booklet, HF zone tool, & Daily Weight Tracker Tool.  Patient has scale at home: *** Patient has pill box at home: ***    High Risk Criteria for Readmission and/or Poor Patient Outcomes: Heart failure hospital admissions (last 6 months): ***  No Show rate: *** Difficult social situation: *** Demonstrates medication adherence: *** Primary Language: *** Literacy level: ***  Barriers of Care:   ***  Considerations/Referrals:   Referral made to  Heart Failure Pharmacist Stewardship: *** Referral made to Heart Failure CSW/NCM TOC: *** Referral made to Heart & Vascular TOC clinic: ***  Items for Follow-up on DC/TOC: ***   ***

## 2023-04-30 NOTE — Progress Notes (Signed)
Prior-To-Admission Oral Chemotherapy for Treatment of Oncologic Disease   Order noted from Dr. Sherryll Burger to continue prior-to-admission oral chemotherapy regimen of venetoclax.  Procedure Per Pharmacy & Therapeutics Committee Policy: Orders for continuation of home oral chemotherapy for treatment of an oncologic disease will be held unless approved by an oncologist during current admission.    For patients receiving oncology care at [facility], inpatient pharmacist contacts patient's oncologist during regular office hours to review. If earlier review is medically necessary, attending physician consults [facility] on-call oncologist   For patients receiving oncology care outside of [place], attending physician consults patient's oncologist to review. If this oncologist or their coverage cannot be reached, attending physician consults [facility] on-call oncologist   Oral chemotherapy continuation order is approved by [facility] Oncologist Dr. Julaine Fusi, PharmD PGY1 Pharmacy Resident  Please check AMION for all [facility] Pharmacy phone numbers After 10:00 PM, call Main Pharmacy [phone] 04/30/2023, 8:09 AM

## 2023-04-30 NOTE — Progress Notes (Signed)
Rounding Note    Patient Name: Andrew [name] Date of Encounter: 04/30/2023  [facility] Cardiologist: Dina Rich, MD   Subjective   Admitted with non-STEMI.  No further chest pain on IV heparin.  Plan cath tomorrow.  Inpatient Medications    Scheduled Meds:  aspirin EC  81 mg Oral Daily   atorvastatin  80 mg Oral Daily   carvedilol  6.25 mg Oral BID   cholecalciferol  1,000 Units Oral Daily   cyanocobalamin  1,000 mcg Oral Daily   ferrous sulfate  325 mg Oral Once per day on Monday Wednesday Friday   hydrALAZINE  25 mg Oral Q8H   isosorbide mononitrate  30 mg Oral Daily   levothyroxine  100 mcg Oral Daily   multivitamin with minerals  1 tablet Oral Daily   pantoprazole  40 mg Oral Daily   sodium bicarbonate  650 mg Oral TID   tamsulosin  0.4 mg Oral QPC supper   venetoclax  300 mg Oral Q supper   Continuous Infusions:  heparin 1,400 Units/hr (04/30/23 0440)   PRN Meds: acetaminophen **OR** acetaminophen, ondansetron **OR** ondansetron (ZOFRAN) IV   Vital Signs    Vitals:   04/30/23 0140 04/30/23 0621 04/30/23 0740 04/30/23 1144  BP: 120/72 117/71 113/70 115/70  Pulse:  68    Resp:  18 18 18   Temp: 97.6 F (36.4 C) 97.7 F (36.5 C) 97.7 F (36.5 C) 98.1 F (36.7 C)  TempSrc: Oral Oral Oral Oral  SpO2: 97% 96% 98% 97%  Weight:  102.9 kg    Height:        Intake/Output Summary (Last 24 hours) at 04/30/2023 1537 Last data filed at 04/30/2023 0600 Gross per 24 hour  Intake 649.39 ml  Output 850 ml  Net -200.61 ml      04/30/2023    6:21 AM 04/29/2023    8:20 AM 01/18/2023    8:03 AM  Last 3 Weights  Weight (lbs) 226 lb 13.7 oz 223 lb 229 lb 6.4 oz  Weight (kg) 102.9 kg 101.152 kg 104.055 kg      Telemetry    Atrial fibrillation with controlled ventricular response- Personally Reviewed  ECG    Not performed today- Personally Reviewed  Physical Exam   GEN: No acute distress.   Neck: No JVD Cardiac: Irregularly irregular, no  murmurs, rubs, or gallops.  Respiratory: Clear to auscultation bilaterally. GI: Soft, nontender, non-distended  MS: No edema; No deformity. Neuro:  Nonfocal  Psych: Normal affect   Labs    High Sensitivity Troponin:   Recent Labs  Lab 04/29/23 1029 04/29/23 1432 04/29/23 2040 04/29/23 2311 04/30/23 0342  TROPONINIHS 453* 3,153* 6,724* 7,531* 6,501*     Chemistry Recent Labs  Lab 04/29/23 0904 04/30/23 0342  NA 140 139  K 4.1 3.8  CL 107 110  CO2 23 22  GLUCOSE 141* 106*  BUN 36* 37*  CREATININE 3.11* 3.38*  CALCIUM 9.1 8.9  MG  --  2.2  GFRNONAA 19* 17*  ANIONGAP 10 7    Lipids  Recent Labs  Lab 04/30/23 0342  CHOL 148  TRIG 95  HDL 27*  LDLCALC 102*  CHOLHDL 5.5    Hematology Recent Labs  Lab 04/29/23 0904 04/30/23 0342  WBC 3.1* 3.0*  RBC 3.68* 3.35*  HGB 12.9* 11.6*  HCT 37.5* 33.6*  MCV 101.9* 100.3*  MCH 35.1* 34.6*  MCHC 34.4 34.5  RDW 12.7 12.7  PLT 100* 83*   Thyroid  Recent Labs  Lab 04/30/23 0342  TSH 0.552    BNP Recent Labs  Lab 04/29/23 2040  BNP 704.0*    DDimer No results for input(s): "DDIMER" in the last 168 hours.   Radiology    ECHOCARDIOGRAM COMPLETE  Result Date: 04/29/2023    ECHOCARDIOGRAM REPORT   Patient Name:   Andrew [name] Date of Exam: 04/29/2023 Medical Rec #:  [id number]      Height:       74.0 in Accession #:    [id number]     Weight:       223.0 lb Date of Birth:  1941/01/06     BSA:          2.276 m Patient Age:    82 years       BP:           140/94 mmHg Patient Gender: M              HR:           68 bpm. [facility] Procedure: 2D Echo, Cardiac Doppler, Color Doppler and Intracardiac            Opacification Agent Indications:    R07.9* Chest pain, unspecified  History:        Patient has prior history of Echocardiogram examinations, most                 recent 08/14/2016. Previous Myocardial Infarction, Abnormal ECG,                 Arrythmias:Atrial Fibrillation, Signs/Symptoms:Chest  Pain; Risk                 Factors:Hypertension. Leukemia.  Sonographer:    Sheralyn Boatman RDCS Referring Phys: [id number] Lamont Dowdy [facility]  Sonographer Comments: Technically difficult study due to poor echo windows. IMPRESSIONS  1. Mid to distal deptum, apical anterior segment, and apex are akinetic. No LV thrombus on contrast imaging. Other segments are hypokinetic. Left ventricular ejection fraction, by estimation, is 25 to 30%. The left ventricle has severely decreased function. The left ventricle demonstrates regional wall motion abnormalities (see scoring diagram/findings for description). The left ventricular internal cavity size was mildly dilated. There is mild left ventricular hypertrophy. Left ventricular diastolic parameters are indeterminate.  2. Right ventricular systolic function is normal. The right ventricular size is moderately enlarged. Tricuspid regurgitation signal is inadequate for assessing PA pressure.  3. Left atrial size was moderately dilated.  4. Right atrial size was severely dilated.  5. The mitral valve is normal in structure. Mild mitral valve regurgitation. No evidence of mitral stenosis.  6. The aortic valve is tricuspid. There is mild calcification of the aortic valve. There is mild thickening of the aortic valve. Aortic valve regurgitation is trivial. Aortic valve sclerosis is present, with no evidence of aortic valve stenosis.  7. Aortic dilatation noted. There is moderate dilatation of the ascending aorta, measuring 44 mm.  8. The inferior vena cava is dilated in size with >50% respiratory variability, suggesting right atrial pressure of 8 mmHg. Comparison(s): The left ventricular function is significantly worse. Conclusion(s)/Recommendation(s): Findings consistent with ischemic cardiomyopathy. No left ventricular mural or apical thrombus/thrombi. FINDINGS  Left Ventricle: Mid to distal deptum, apical anterior segment, and apex are akinetic. No LV thrombus on contrast imaging. Other  segments are hypokinetic. Left ventricular ejection fraction, by estimation, is 25 to 30%. The left ventricle has severely decreased function. The left ventricle demonstrates regional wall  motion abnormalities. Definity contrast agent was given IV to delineate the left ventricular endocardial borders. The left ventricular internal cavity size was mildly dilated. There is mild left ventricular hypertrophy. Left ventricular diastolic function could not be evaluated due to atrial fibrillation. Left ventricular diastolic parameters are indeterminate.  LV Wall Scoring: The mid and distal anterior septum, apical anterior segment, and apex are akinetic. Right Ventricle: The right ventricular size is moderately enlarged. No increase in right ventricular wall thickness. Right ventricular systolic function is normal. Tricuspid regurgitation signal is inadequate for assessing PA pressure. Left Atrium: Left atrial size was moderately dilated. Right Atrium: Right atrial size was severely dilated. Pericardium: There is no evidence of pericardial effusion. Mitral Valve: The mitral valve is normal in structure. Mild mitral valve regurgitation. No evidence of mitral valve stenosis. Tricuspid Valve: The tricuspid valve is normal in structure. Tricuspid valve regurgitation is mild . No evidence of tricuspid stenosis. Aortic Valve: The aortic valve is tricuspid. There is mild calcification of the aortic valve. There is mild thickening of the aortic valve. Aortic valve regurgitation is trivial. Aortic valve sclerosis is present, with no evidence of aortic valve stenosis. Pulmonic Valve: The pulmonic valve was grossly normal. Pulmonic valve regurgitation is mild. No evidence of pulmonic stenosis. Aorta: Aortic dilatation noted and the aortic root is normal in size and structure. There is moderate dilatation of the ascending aorta, measuring 44 mm. Venous: The inferior vena cava is dilated in size with greater than 50% respiratory  variability, suggesting right atrial pressure of 8 mmHg. IAS/Shunts: No atrial level shunt detected by color flow Doppler.  LEFT VENTRICLE PLAX 2D LVIDd:         5.60 cm LVIDs:         4.10 cm LV PW:         1.40 cm LV IVS:        1.20 cm LVOT diam:     2.55 cm LV SV:         81 LV SV Index:   36 LVOT Area:     5.11 cm  LV Volumes (MOD) LV vol d, MOD A2C: 152.0 ml LV vol d, MOD A4C: 160.5 ml LV vol s, MOD A2C: 118.5 ml LV vol s, MOD A4C: 97.3 ml LV SV MOD A2C:     33.5 ml LV SV MOD A4C:     160.5 ml LV SV MOD BP:      48.0 ml RIGHT VENTRICLE             IVC RV S prime:     13.20 cm/s  IVC diam: 2.60 cm TAPSE (M-mode): 1.7 cm LEFT ATRIUM              Index        RIGHT ATRIUM           Index LA diam:        4.30 cm  1.89 cm/m   RA Area:     29.60 cm LA Vol (A2C):   159.0 ml 69.85 ml/m  RA Volume:   98.20 ml  43.14 ml/m LA Vol (A4C):   103.0 ml 45.25 ml/m LA Biplane Vol: 129.0 ml 56.67 ml/m  AORTIC VALVE             PULMONIC VALVE LVOT Vmax:   86.40 cm/s  PR End Diast Vel: 1.33 msec LVOT Vmean:  51.400 cm/s LVOT VTI:    0.159 m  AORTA Ao Root diam: 3.90 cm Ao Asc  diam:  4.40 cm MITRAL VALVE MV Area (PHT): 4.49 cm     SHUNTS MV Decel Time: 169 msec     Systemic VTI:  0.16 m MV E velocity: 105.00 cm/s  Systemic Diam: 2.55 cm Lennie Odor MD Electronically signed by Lennie Odor MD Signature Date/Time: 04/29/2023/3:50:35 PM    Final    CT Head Wo Contrast  Result Date: 04/29/2023 CLINICAL DATA:  Chest pain. Personal history of intracranial hemorrhage. Need to start heparin. EXAM: CT HEAD WITHOUT CONTRAST TECHNIQUE: Contiguous axial images were obtained from the base of the skull through the vertex without intravenous contrast. RADIATION DOSE REDUCTION: This exam was performed according to the departmental dose-optimization program which includes automated exposure control, adjustment of the mA and/or kV according to patient size and/or use of iterative reconstruction technique. COMPARISON:  CT head without  contrast 12/11/2016 FINDINGS: Brain: Mild atrophy and white matter changes are stable. The ventricles are of normal size. No significant extraaxial fluid collection is present. The brainstem and cerebellum are within normal limits. Craniotomy noted. No residual or recurrent intracranial hemorrhage is present. Vascular: No hyperdense vessel or unexpected calcification. Skull: Right frontal parietal craniotomy noted. Calvarium is otherwise intact. No significant extracranial soft tissue lesion is present. Sinuses/Orbits: The paranasal sinuses and mastoid air cells are clear. Bilateral lens replacements are noted. Globes and orbits are otherwise unremarkable. IMPRESSION: 1. No acute intracranial abnormality or significant interval change. 2. Stable atrophy and white matter disease. This likely reflects the sequela of chronic microvascular ischemia. 3. Right frontal parietal craniotomy without residual or recurrent intracranial hemorrhage. These results were called by telephone at the time of interpretation on 04/29/2023 at 10:48 am to provider Vonita Moss , who verbally acknowledged these results. Electronically Signed   By: Marin Roberts M.D.   On: 04/29/2023 11:09   DG Shoulder Left  Result Date: 04/29/2023 CLINICAL DATA:  82 year old male with history of left shoulder pain. EXAM: LEFT SHOULDER - 2+ VIEW COMPARISON:  No priors. FINDINGS: Three views of the left shoulder demonstrate no acute displaced fracture, subluxation or dislocation. There is joint space narrowing, subchondral sclerosis, subchondral cyst formation and osteophyte formation noted in the glenohumeral joint. IMPRESSION: 1. No acute abnormality of the left shoulder. 2. Glenohumeral joint osteoarthritis, as above. Electronically Signed   By: Trudie Reed M.D.   On: 04/29/2023 08:58   DG Chest 2 View  Result Date: 04/29/2023 CLINICAL DATA:  82 year old male with history of chest pain. EXAM: CHEST - 2 VIEW COMPARISON:  Chest x-ray  08/22/2017. FINDINGS: Lung volumes are normal. No consolidative airspace disease. No pleural effusions. No pneumothorax. No pulmonary nodule or mass noted. Pulmonary vasculature appears engorged. Heart size is mildly enlarged. Upper mediastinal contours are within normal limits. Atherosclerotic calcifications are noted in the thoracic aorta. Atherosclerosis in the thoracic aorta. IMPRESSION: 1. Cardiomegaly with pulmonary venous congestion, but no frank pulmonary edema. 2. Aortic atherosclerosis. Electronically Signed   By: Trudie Reed M.D.   On: 04/29/2023 08:57    Cardiac Studies   2D echo (04/29/2023)  IMPRESSIONS     1. Mid to distal deptum, apical anterior segment, and apex are akinetic.  No LV thrombus on contrast imaging. Other segments are hypokinetic. Left  ventricular ejection fraction, by estimation, is 25 to 30%. The left  ventricle has severely decreased  function. The left ventricle demonstrates regional wall motion  abnormalities (see scoring diagram/findings for description). The left  ventricular internal cavity size was mildly dilated. There is mild left  ventricular hypertrophy. Left ventricular  diastolic parameters are indeterminate.   2. Right ventricular systolic function is normal. The right ventricular  size is moderately enlarged. Tricuspid regurgitation signal is inadequate  for assessing PA pressure.   3. Left atrial size was moderately dilated.   4. Right atrial size was severely dilated.   5. The mitral valve is normal in structure. Mild mitral valve  regurgitation. No evidence of mitral stenosis.   6. The aortic valve is tricuspid. There is mild calcification of the  aortic valve. There is mild thickening of the aortic valve. Aortic valve  regurgitation is trivial. Aortic valve sclerosis is present, with no  evidence of aortic valve stenosis.   7. Aortic dilatation noted. There is moderate dilatation of the ascending  aorta, measuring 44 mm.   8. The  inferior vena cava is dilated in size with >50% respiratory  variability, suggesting right atrial pressure of 8 mmHg.   Patient Profile     Andrew [name] is a 82 y.o. 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 Lowell Guitar.   Assessment & Plan    1: Non-STEMI-troponins increased to 7500.  Currently pain-free on IV heparin.  Plan diagnostic coronary angiography tomorrow.  I have reviewed the risks, indications, and alternatives to cardiac catheterization, possible angioplasty, and stenting with the patient. Risks include but are not limited to bleeding, infection, vascular injury, stroke, myocardial infection, arrhythmia, kidney injury, radiation-related injury in the case of prolonged fluoroscopy use, emergency cardiac surgery, and death. The patient understands the risks of serious complication is 1-2 in 1000 with diagnostic cardiac cath and 1-2% or less with angioplasty/stenting.  2: CKD 4-creatinine is in the low 3 range.  He does have a left arm AV fistula in anticipation potential dialysis.  We talked about the possibility of radiocontrast nephropathy based on cath but have decided that the risk benefit ratio favors coronary angiography potential intervention.  3: Permanent A-fib-not on anticoagulation because history of prior intracranial bleed  4: Essential hypertension-well-controlled on current medications new problem  5: Hyperlipidemia-LDL of 102 on atorvastatin 80 mg.  Will continue to follow      For questions or updates, please contact [facility] Please consult [email or web address] for contact info under        Signed, Nanetta Batty, MD  04/30/2023, 3:37 PM

## 2023-04-30 NOTE — Consult Note (Addendum)
Pharmacy Consult Note - Anticoagulation  Pharmacy Consult for heparin Indication: chest pain/ACS  PATIENT MEASUREMENTS: Height: 6\' 2"  (188 cm) Weight: 102.9 kg (226 lb 13.7 oz) IBW/kg (Calculated) : 82.2 HEPARIN DW (KG): 101.2  VITAL SIGNS: Temp: 97.7 F (36.5 C) (12/02 0621) Temp Source: Oral (12/02 0621) BP: 117/71 (12/02 0621) Pulse Rate: 68 (12/02 0621)  Recent Labs    04/29/23 1111 04/29/23 1432 04/30/23 0342  HGB  --   --  11.6*  HCT  --   --  33.6*  PLT  --   --  83*  APTT 25  --   --   LABPROT 14.0  --   --   INR 1.1  --   --   HEPARINUNFRC  --    < > 0.30  CREATININE  --   --  3.38*  TROPONINIHS  --    < > 6,501*   < > = values in this interval not displayed.    Estimated Creatinine Clearance: 21.2 mL/min (A) (by C-G formula based on SCr of 3.38 mg/dL (H)).  PAST MEDICAL HISTORY: Past Medical History:  Diagnosis Date   Arthritis    Ascending aortic aneurysm (HCC)    CKD (chronic kidney disease), stage IV (HCC)    followed by [facility]   CLL (chronic lymphocytic leukemia) (HCC)    Complication of anesthesia    "wild when I woke up "   History of kidney stones    passed    Hypertension    NICM (nonischemic cardiomyopathy) (HCC)    a. cath 2007, EF 35%, minimal CAD. b. EF normal 2018.   PAF (paroxysmal atrial fibrillation) (HCC)    Pinched nerve in neck    Pneumonia    SDH (subdural hematoma) (HCC)    Thrombocytopenia (HCC)     ASSESSMENT: 82 y.o. male with PMH including ICH, chronic lymphocytic leukemia and Afib s/p ablation (not currently on anticoagulation) is presenting with chest pain and left shoulder pain. cTn are trending up and ECG exhibits nonspecific T wave abnormalities. Head CT did not show any evidence of ICH. Patient is not on chronic anticoagulation per chart review. Pharmacy has been consulted to initiate and manage heparin intravenous infusion.  Heparin level was on the low end of therapeutic at 0.3 on 1400 units/hr,  however, this level was obtained 5 hours after the last rate change and not the preferred 8 hours, so unable to accurately assess the level. Will obtain confirmatory level in 8 hours. Known history of thrombocytopenia, with PLT count down to 83, Hgb stable at 11.6. No issues with infusion and no new signs or symptoms of bleeding noted.  ADDENDUM 11:36: Heparin level therapeutic at 0.42 on 1400 units/hr. Will maintain current infusion rate and monitor heparin levels daily.   Goal(s) of therapy: Heparin level 0.3 - 0.7 units/mL Monitor platelets by anticoagulation protocol: Yes   PLAN: Continue heparin infusion at 1400 units/hr Obtain 8 hour heparin level  Daily CBC and heparin level while on heparin Monitor s/s bleeding  Follow-up plans for cardiac cath pending nephrology consult  Lennie Muckle, PharmD PGY1 Pharmacy Resident 04/30/2023 7:21 AM

## 2023-04-30 NOTE — Progress Notes (Signed)
   Heart Failure Stewardship Pharmacist Progress Note   PCP: Benita Stabile, MD PCP-Cardiologist: Dina Rich, MD    HPI:  82 yo M with PMH of CHF, HTN, HLD, stage IV CLL, CKD IV, subdural hematoma, normocytic anemia, and afib.   Previous cardiac catheterization performed in January 2007 suggestive of nonischemic cardiomyopathy.  He had ejection fraction of 35% to time. Improved to 55-60% in 2018.   Presented to the ED on 12/1 with chest pain. CXR with cardiomegaly and pulmonary vascular congestion. Troponin 239>>3153. EKG with afib. ECHO 12/1 with LVEF 25-30%, RWMA, mild LVH, RV normal, mild MR. He has a left fistula placed several years ago but has not needed it since renal function improved. Anticipated cath on 12/3 but patient states he would like to discuss with renal first.   Current HF Medications: Beta Blocker: carvedilol 6.25 mg BID Other: hydralazine 25 mg TID + Imdur 30 mg daily  Prior to admission HF Medications: Beta blocker: carvedilol 6.25 mg BID  Pertinent Lab Values: Serum creatinine 3.38, BUN 37, Potassium 3.8, Sodium 139, BNP 704, Magnesium 2.2   Vital Signs: Weight: 226 lbs (admission weight: 223 lbs) Blood pressure: 110/70s  Heart rate: 60-70s  I/O: net -0.4L yesterday; net -0.7L since admission  Medication Assistance / Insurance Benefits Check: Does the patient have prescription insurance?  Yes Type of insurance plan: [employer] Medicare  Outpatient Pharmacy:  Prior to admission outpatient pharmacy: [facility] Is the patient willing to use [facility] pharmacy at discharge? Yes Is the patient willing to transition their outpatient pharmacy to utilize a [facility] outpatient pharmacy?   No    Assessment: 1. Acute on chronic systolic CHF (LVEF 25-30%), planning for cath on 12/3. NYHA class II symptoms. - Not volume overloaded on exam - Continue carvedilol 6.25 mg BID - No ACE/ARB/ARNI or MRA with advanced CKD - Continue hydralazine 25 mg TID + Imdur 30 mg daily -  Consider SGLT2i pending renal function after cath   Plan: 1) Medication changes recommended at this time: - None pending cath plans  2) Patient assistance: - None pending  3)  Education  - Initial education completed - Full education to be completed prior to discharge  Sharen Hones, PharmD, BCPS Heart Failure Engineer, building services Phone [phone]

## 2023-04-30 NOTE — Progress Notes (Signed)
RN went in the room to instruct patient about the procedure tomorrow and secure consent as well. However, wife and daughter as well as the patient voiced out about their concern before signing the consent. They wished to talk with the kidney doctor and asked some questions. They're waiting for the nephrologist approval before proceeding with heart cath. RN wasn't able to secure consent.

## 2023-05-01 ENCOUNTER — Encounter (HOSPITAL_COMMUNITY)
Admission: EM | Disposition: A | Discharge status: Home / Self Care | Payer: Self-pay | Source: Home / Self Care | Attending: Internal Medicine

## 2023-05-01 ENCOUNTER — Encounter (HOSPITAL_COMMUNITY): Payer: Self-pay | Admitting: Internal Medicine

## 2023-05-01 DIAGNOSIS — N184 Chronic kidney disease, stage 4 (severe): Secondary | ICD-10-CM | POA: Diagnosis not present

## 2023-05-01 DIAGNOSIS — I4891 Unspecified atrial fibrillation: Secondary | ICD-10-CM

## 2023-05-01 DIAGNOSIS — I2510 Atherosclerotic heart disease of native coronary artery without angina pectoris: Secondary | ICD-10-CM

## 2023-05-01 DIAGNOSIS — I5023 Acute on chronic systolic (congestive) heart failure: Secondary | ICD-10-CM | POA: Diagnosis not present

## 2023-05-01 DIAGNOSIS — I214 Non-ST elevation (NSTEMI) myocardial infarction: Secondary | ICD-10-CM

## 2023-05-01 HISTORY — PX: CORONARY PRESSURE/FFR STUDY: CATH118243

## 2023-05-01 HISTORY — PX: LEFT HEART CATH AND CORONARY ANGIOGRAPHY: CATH118249

## 2023-05-01 LAB — BASIC METABOLIC PANEL
Anion gap: 8 (ref 5–15)
BUN: 32 mg/dL — ABNORMAL HIGH (ref 8–23)
CO2: 21 mmol/L — ABNORMAL LOW (ref 22–32)
Calcium: 8.6 mg/dL — ABNORMAL LOW (ref 8.9–10.3)
Chloride: 113 mmol/L — ABNORMAL HIGH (ref 98–111)
Creatinine, Ser: 3.38 mg/dL — ABNORMAL HIGH (ref 0.61–1.24)
GFR, Estimated: 17 mL/min — ABNORMAL LOW (ref >60)
Glucose, Bld: 115 mg/dL — ABNORMAL HIGH (ref 70–99)
Potassium: 4 mmol/L (ref 3.5–5.1)
Sodium: 142 mmol/L (ref 135–145)

## 2023-05-01 LAB — CBC
HCT: 32.9 % — ABNORMAL LOW (ref 39.0–52.0)
Hemoglobin: 11.1 g/dL — ABNORMAL LOW (ref 13.0–17.0)
MCH: 33.7 pg (ref 26.0–34.0)
MCHC: 33.7 g/dL (ref 30.0–36.0)
MCV: 100 fL (ref 80.0–100.0)
Platelets: 82 10*3/uL — ABNORMAL LOW (ref 150–400)
RBC: 3.29 MIL/uL — ABNORMAL LOW (ref 4.22–5.81)
RDW: 12.7 % (ref 11.5–15.5)
WBC: 3.2 10*3/uL — ABNORMAL LOW (ref 4.0–10.5)
nRBC: 0 % (ref 0.0–0.2)

## 2023-05-01 LAB — POCT ACTIVATED CLOTTING TIME: Activated Clotting Time: 233 s

## 2023-05-01 LAB — HEPARIN LEVEL (UNFRACTIONATED): Heparin Unfractionated: 0.31 [IU]/mL (ref 0.30–0.70)

## 2023-05-01 SURGERY — LEFT HEART CATH AND CORONARY ANGIOGRAPHY
Anesthesia: LOCAL

## 2023-05-01 MED ORDER — SODIUM CHLORIDE 0.9% FLUSH
3.0000 mL | Freq: Two times a day (BID) | INTRAVENOUS | Status: DC
  Administered 2023-05-02 – 2023-05-05 (×3): 3 mL via INTRAVENOUS

## 2023-05-01 MED ORDER — LIDOCAINE HCL (PF) 1 % IJ SOLN
INTRAMUSCULAR | Status: AC
  Filled 2023-05-01: qty 30

## 2023-05-01 MED ORDER — SODIUM CHLORIDE 0.9% FLUSH: 3.0000 mL | INTRAVENOUS | Status: DC | PRN

## 2023-05-01 MED ORDER — MIDAZOLAM HCL 2 MG/2ML IJ SOLN
INTRAMUSCULAR | Status: DC | PRN
  Administered 2023-05-01: 1 mg via INTRAVENOUS

## 2023-05-01 MED ORDER — HEPARIN SODIUM (PORCINE) 1000 UNIT/ML IJ SOLN
INTRAMUSCULAR | Status: DC | PRN
  Administered 2023-05-01 (×2): 4000 [IU] via INTRAVENOUS
  Administered 2023-05-01: 2000 [IU] via INTRAVENOUS

## 2023-05-01 MED ORDER — SODIUM CHLORIDE 0.9 % IV SOLN: INTRAVENOUS | Status: AC

## 2023-05-01 MED ORDER — MIDAZOLAM HCL 2 MG/2ML IJ SOLN
INTRAMUSCULAR | Status: AC
  Filled 2023-05-01: qty 2

## 2023-05-01 MED ORDER — HEPARIN SODIUM (PORCINE) 1000 UNIT/ML IJ SOLN
INTRAMUSCULAR | Status: AC
  Filled 2023-05-01: qty 10

## 2023-05-01 MED ORDER — SODIUM CHLORIDE 0.9 % IV SOLN: 250.0000 mL | INTRAVENOUS | Status: DC | PRN

## 2023-05-01 MED ORDER — VERAPAMIL HCL 2.5 MG/ML IV SOLN
INTRAVENOUS | Status: DC | PRN
  Administered 2023-05-01: 4 mL via INTRA_ARTERIAL

## 2023-05-01 MED ORDER — HEPARIN (PORCINE) IN NACL 1000-0.9 UT/500ML-% IV SOLN
INTRAVENOUS | Status: DC | PRN
  Administered 2023-05-01 (×2): 500 mL

## 2023-05-01 MED ORDER — FENTANYL CITRATE (PF) 100 MCG/2ML IJ SOLN
INTRAMUSCULAR | Status: AC
  Filled 2023-05-01: qty 2

## 2023-05-01 MED ORDER — LIDOCAINE HCL (PF) 1 % IJ SOLN
INTRAMUSCULAR | Status: DC | PRN
  Administered 2023-05-01: 2 mL

## 2023-05-01 MED ORDER — VERAPAMIL HCL 2.5 MG/ML IV SOLN
INTRAVENOUS | Status: AC
  Filled 2023-05-01: qty 2

## 2023-05-01 MED ORDER — HEPARIN (PORCINE) 25000 UT/250ML-% IV SOLN
1700.0000 [IU]/h | INTRAVENOUS | Status: DC
  Administered 2023-05-02 (×2): 1700 [IU]/h via INTRAVENOUS
  Administered 2023-05-02: 1450 [IU]/h via INTRAVENOUS
  Administered 2023-05-03 – 2023-05-04 (×2): 1700 [IU]/h via INTRAVENOUS
  Filled 2023-05-01 (×4): qty 250

## 2023-05-01 MED ORDER — FENTANYL CITRATE (PF) 100 MCG/2ML IJ SOLN
INTRAMUSCULAR | Status: DC | PRN
  Administered 2023-05-01: 25 ug via INTRAVENOUS

## 2023-05-01 SURGICAL SUPPLY — 13 items
CATH INFINITI 5 FR JL3.5 (CATHETERS) ×1
CATH INFINITI AMBI 5FR TG (CATHETERS) ×1
CATH INFINITI JR4 5F (CATHETERS) ×1
CATH VISTA GUIDE 6FR XBLAD3.5 (CATHETERS) ×1
DEVICE RAD COMP TR BAND LRG (VASCULAR PRODUCTS) ×1
GLIDESHEATH SLEND A-KIT 6F 22G (SHEATH) ×1
GUIDEWIRE PRESSURE X 175 (WIRE) ×1
INQWIRE 1.5J .035X260CM (WIRE) ×1
KIT ENCORE 26 ADVANTAGE (KITS) ×1
KIT ESSENTIALS PG (KITS) ×1
PACK CARDIAC CATHETERIZATION (CUSTOM PROCEDURE TRAY) ×1
SET ATX-X65L (MISCELLANEOUS) ×1
WIRE ASAHI PROWATER 180CM (WIRE) ×1

## 2023-05-01 NOTE — Consult Note (Cosign Needed Addendum)
[name]       [place] [address]             [phone]        Andrew [name] Medical Record #[id number] Date of Birth: January 13, 1941  Referring: No ref. provider found Primary Care: Benita Stabile, MD Primary Cardiologist:Branch, Christiane Ha, MD  Chief Complaint:    Chief Complaint  Patient presents with   Chest Pain    History of Present Illness:     This is an 82 year old male with a past medical history of stage IV CLL, stage IV CKD, normocytic anemia, permanent atrial fibrillation (not on anticoagulation due to prior subdural hematoma and chronic thrombocytopenia), hypertension, hyperlipidemia, hypothyroidism, ? ascending thoracic aortic aneurysm (per recent echo, CTA 2019 did not mention ATAA nor did multiple CTs), and very remote tobacco abuse  who presented to the ED with complaints of chest pain on 04/29/2023. He was in his usual state of health until Apparently,  on 04/28/2023 after drinking water, he had left shoulder soreness and chest soreness. This lasted for about 30 minutes and then spontaneously resolved. The aforementioned occurred again the morning of 04/29/2023. He denied diaphoresis, nausea, vomiting, or syncope.  Hospitalist discussed with cardiology and it was felt patient needed to be transferred from [facility] to [facility] for further evaluation and treatment. EKG showed a fib/flutter. Initial Tropinin I was 239 and max'd at 7,531. He ruled in for a NSTEMI. He was put on a Heparin drip. Echo done 04/29/2023 showed LVEF 25-30%, left ventricle demonstrates regional wall motion, no AS, trivial AI, aortic dilatation 44 mm, mild MR. Cardiac catheterization done today showed single-vessel proximal LAD high-grade stenosis with large poststenotic dilatation. A cardiothoracic consultation has been requested with Dr. Cliffton Asters. At the time of my exam, patient has no chest pain or shortness of breath.  Per patient, his father died at  age 57 from an MI. He has noticed being more fatigued and short of breath with exertion (although has chronic dyspnea with exertion) lately.  Current Activity/ Functional Status: Patient is independent with mobility/ambulation, transfers, ADL's, IADL's.   Zubrod Score: At the time of surgery this patient's most appropriate activity status/level should be described as: []     0    Normal activity, no symptoms [x]     1    Restricted in physical strenuous activity but ambulatory, able to do out light work []     2    Ambulatory and capable of self care, unable to do work activities, up and about more than 50%  Of the time                            []     3    Only limited self care, in bed greater than 50% of waking hours []     4    Completely disabled, no self care, confined to bed or chair []     5    Moribund  Past Medical History:  Diagnosis Date   Arthritis    Ascending aortic aneurysm (HCC)    CHF (congestive heart failure) (HCC)    cardiac catheterization in 2007 had revealed LVEF of 35% with no significant coronary disease   CKD (chronic kidney disease), stage IV (HCC)    followed by [facility]   CLL (chronic lymphocytic leukemia) (HCC)    Complication of anesthesia    "  wild when I woke up "   History of kidney stones    passed    Hypertension    NICM (nonischemic cardiomyopathy) (HCC)    Cardiac cath 2007, EF 35%, minimal CAD. b. EF normal 2018.   PAF (paroxysmal atrial fibrillation) (HCC)    Pinched nerve in neck    Pneumonia    SDH (subdural hematoma) (HCC)    Thrombocytopenia (HCC)     Past Surgical History:  Procedure Laterality Date   AV FISTULA PLACEMENT Left 04/30/2020   Procedure: LEFT ARM BRACHIOCEPHALIC ARTERIOVENOUS (AV) FISTULA CREATION;  Surgeon: Nada Libman, MD;  Location: [facility] OR;  Service: Vascular;  Laterality: Left;   BIOPSY  08/05/2018   Procedure: BIOPSY;  Surgeon: West Bali, MD;  Location: [name];  Service: Endoscopy;;  gastric     Bone marrow biospy Bilateral    CATARACT EXTRACTION, BILATERAL     COLONOSCOPY  07/10/2011   Procedure: COLONOSCOPY;  Surgeon: Arlyce Harman, MD;  Location: [name];  Service: Endoscopy;  Laterality: N/A;  10:30 AM   CRANIOTOMY N/A 10/14/2016   Procedure: CRANIOTOMY HEMATOMA EVACUATION SUBDURAL;  Surgeon: Shirlean Kelly, MD;  Location: [facility] OR;  Service: Neurosurgery;  Laterality: N/A;  CRANIOTOMY HEMATOMA EVACUATION SUBDURAL   ESOPHAGOGASTRODUODENOSCOPY N/A 08/05/2018   Procedure: ESOPHAGOGASTRODUODENOSCOPY (EGD);  Surgeon: West Bali, MD;  Location: [name];  Service: Endoscopy;  Laterality: N/A;  3:00pm   THYROIDECTOMY, PARTIAL     TONSILLECTOMY      Social History   Tobacco Use  Smoking Status Former   Current packs/day: 0.00   Average packs/day: 1 pack/day for 15.0 years (15.0 ttl pk-yrs)   Types: Cigarettes   Start date: 01/26/1956   Quit date: 01/26/1971   Years since quitting: 52.2  Smokeless Tobacco Never    Social History   Substance and Sexual Activity  Alcohol Use No    Allergies: No Known Allergies  Current Facility-Administered Medications  Medication Dose Route Frequency Provider Last Rate Last Admin   0.9 %  sodium chloride infusion   Intravenous Continuous Leeroy Bock, MD 75 mL/hr at 05/01/23 0854 Rate Change at 05/01/23 0854   0.9% sodium chloride infusion  1 mL/kg/hr Intravenous Continuous Laverda Page B, NP   Stopped at 05/01/23 0854   [MAR Hold] acetaminophen (TYLENOL) tablet 650 mg  650 mg Oral Q6H PRN Maurilio Lovely D, DO   650 mg at 04/30/23 1630   Or   [MAR Hold] acetaminophen (TYLENOL) suppository 650 mg  650 mg Rectal Q6H PRN Sherryll Burger, Pratik D, DO       [MAR Hold] aspirin EC tablet 81 mg  81 mg Oral Daily Sande Rives, MD   81 mg at 04/30/23 0855   [MAR Hold] atorvastatin (LIPITOR) tablet 80 mg  80 mg Oral Daily Sande Rives, MD   80 mg at 05/01/23 0950   [MAR Hold] carvedilol (COREG) tablet 6.25 mg  6.25 mg  Oral BID Maurilio Lovely D, DO   6.25 mg at 05/01/23 0950   [MAR Hold] cholecalciferol (VITAMIN D3) tablet 1,000 Units  1,000 Units Oral Daily Maurilio Lovely D, DO   1,000 Units at 05/01/23 0950   [MAR Hold] cyanocobalamin (VITAMIN B12) tablet 1,000 mcg  1,000 mcg Oral Daily Sherryll Burger, Pratik D, DO   1,000 mcg at 05/01/23 0950   fentaNYL (SUBLIMAZE) injection    PRN Yates Decamp, MD   25 mcg at 05/01/23 1133   [MAR Hold] ferrous sulfate tablet  325 mg  325 mg Oral Once per day on Monday Wednesday Friday Maurilio Lovely D, DO   325 mg at 04/30/23 7829   Heparin (Porcine) in NaCl 1000-0.9 UT/500ML-% SOLN    PRN Yates Decamp, MD   500 mL at 05/01/23 1237   heparin ADULT infusion 100 units/mL (25000 units/237mL)  1,450 Units/hr Intravenous Continuous Leeroy Bock, MD   Stopped at 05/01/23 1054   heparin sodium (porcine) injection    PRN Yates Decamp, MD   2,000 Units at 05/01/23 1209   [MAR Hold] hydrALAZINE (APRESOLINE) tablet 25 mg  25 mg Oral Q8H Sande Rives, MD   25 mg at 05/01/23 0621   [MAR Hold] isosorbide mononitrate (IMDUR) 24 hr tablet 30 mg  30 mg Oral Daily Sande Rives, MD   30 mg at 05/01/23 0950   [MAR Hold] levothyroxine (SYNTHROID) tablet 100 mcg  100 mcg Oral Daily Zigmund Daniel., MD   100 mcg at 05/01/23 0950   lidocaine (PF) (XYLOCAINE) 1 % injection    PRN Yates Decamp, MD   2 mL at 05/01/23 1135   midazolam (VERSED) injection    PRN Yates Decamp, MD   1 mg at 05/01/23 1133   [MAR Hold] multivitamin with minerals tablet 1 tablet  1 tablet Oral Daily Sherryll Burger, Pratik D, DO   1 tablet at 05/01/23 0950   [MAR Hold] ondansetron (ZOFRAN) tablet 4 mg  4 mg Oral Q6H PRN Sherryll Burger, Pratik D, DO       Or   [MAR Hold] ondansetron (ZOFRAN) injection 4 mg  4 mg Intravenous Q6H PRN Sherryll Burger, Pratik D, DO       [MAR Hold] pantoprazole (PROTONIX) EC tablet 40 mg  40 mg Oral Daily Sherryll Burger, Pratik D, DO   40 mg at 05/01/23 5621   Radial Cocktail/Verapamil only    PRN Yates Decamp, MD   4 mL at 05/01/23  1138   [MAR Hold] sodium bicarbonate tablet 650 mg  650 mg Oral TID Maurilio Lovely D, DO   650 mg at 05/01/23 0950   [MAR Hold] tamsulosin (FLOMAX) capsule 0.4 mg  0.4 mg Oral QPC supper Maurilio Lovely D, DO   0.4 mg at 04/30/23 1734   [MAR Hold] venetoclax (VENCLEXTA) tablet 300 mg  300 mg Oral Q supper Burnett Kanaris, RPH   300 mg at 04/30/23 1734    Medications Prior to Admission  Medication Sig Dispense Refill Last Dose   acetaminophen (TYLENOL) 325 MG tablet Take 650 mg by mouth every 6 (six) hours as needed for mild pain (pain score 1-3).   04/28/2023   amLODipine (NORVASC) 5 MG tablet Take 5 mg by mouth daily.   04/28/2023   carvedilol (COREG) 6.25 MG tablet Take 1 tablet (6.25 mg total) by mouth 2 (two) times daily. 180 tablet 3 04/29/2023   Cholecalciferol (VITAMIN D3) 25 MCG (1000 UT) CAPS Take 1 capsule by mouth daily.   04/29/2023   Cyanocobalamin (VITAMIN B12) 1000 MCG TBCR Take 1,000 mcg by mouth daily.    04/29/2023   ferrous sulfate 325 (65 FE) MG EC tablet Take 325 mg by mouth 3 (three) times a week.   04/27/2023   levothyroxine (SYNTHROID) 25 MCG tablet Take 100 mcg by mouth daily.   04/29/2023   Multiple Vitamin (MULITIVITAMIN WITH MINERALS) TABS Take 1 tablet by mouth daily.   04/29/2023   omeprazole (PRILOSEC) 20 MG capsule 1 PO 30 MINS PRIOR TO BREAKFAST. (Patient taking differently: Take  20 mg by mouth daily.) 90 capsule 3 04/29/2023   sodium bicarbonate 650 MG tablet Take 650 mg by mouth 3 (three) times daily.   04/29/2023   tamsulosin (FLOMAX) 0.4 MG CAPS capsule Take 0.4 mg by mouth daily.    04/28/2023   venetoclax (VENCLEXTA) 100 MG tablet Take 3 tablets (300 mg total) by mouth daily. 84 tablet 3 04/29/2023    Family History  Problem Relation Age of Onset   CAD Father 39   Cancer Mother    Alzheimer's disease Mother    Colon cancer Neg Hx    Review of Systems:    Cardiac Review of Systems: Y or  [  N  ]= no  Chest Pain [ Y-on admission  ]  Exertional SOB  [  Y]      Syncope  [ N ]     General Review of Systems: [Y] = yes [N  ]=no Constitional: fatigue [ Y ]; nausea [  N]; night sweats [ N ]; fever [N  ];                                                               Eye : blurred vision [ N ];vision changes Klaus.Mock  ];   Resp: cough [ N ];  wheezing[N  ];  hemoptysis[ N ];  GI: vomiting[ N ];  melena[N  ];  hematochezia [ N ];  GU:  hematuria[ N ];               Skin:  peripheral edema[N  ];    Heme/Lymph:  anemia[  Y];  Neuro: TIA[N  ];  stroke[ N ];     Endocrine: diabetes[ N ];  thyroid dysfunction[  Y];              Physical Exam: BP (!) 143/83   Pulse 65   Temp 97.7 F (36.5 C) (Oral)   Resp 16   Ht 6\' 2"  (1.88 m)   Wt 102.3 kg   SpO2 96%   BMI 28.97 kg/m    General appearance: alert, cooperative, and no distress Head: Normocephalic, without obvious abnormality, atraumatic Neck: no carotid bruit, no JVD, and supple, symmetrical, trachea midline Resp: clear to auscultation bilaterally Cardio: IRRR IRRR, no murmur GI: Soft, non tender, bowel sounds present Extremities: Palpable DP/PT bilaterally, no LE edema. LUE functioning fistula (thrill/bruit) present Neurologic: Grossly normal  Diagnostic Studies & Laboratory data:     Recent Radiology Findings:   CARDIAC CATHETERIZATION  Result Date: 05/01/2023 Images from the original result were not included. Left Heart Catheterization 05/01/23: Hemodynamic data: LV 100/-1, EDP 6 mmHg.  Ao 97/67, mean 78 mmHg.  There was no pressure gradient across the aortic valve. Angiographic data: RCA: Very large caliber vessel giving origin to large PDA that arises proximally at the crux and a smaller secondary PDA distally and a moderate to large PL branch.  And PL branch.  Mid RCA at the RV branch has a focal 50 to 60% stenosis, IFR negative for significance (1.0). LM: Large-caliber vessel, smooth and normal. LAD: Diffusely diseased in the ostium and proximal segment, there is a focal 99% stenosis followed by  poststenotic dilatation focal up to the origin of D1 which is small to moderate-sized.  Large  D2 with secondary branch.  Rest of the LAD has minimal disease. LCx: Very large caliber vessel giving origin to tiny OM1 large OM 2 and continues as a large OM 3.  Mid segment of LCx has a 50% stenosis, not hemodynamically significant (iFR 1.0) and OM2 has a mid 50% stenosis, not hemodynamically significant (iFR 1.0). Impression and recommendations: Single-vessel proximal LAD high-grade stenosis with large poststenotic dilatation.  Best option would be to consider single-vessel CABG with LIMA to LAD especially in view of chronic thrombocytopenia and potential for long durable results in view of poststenotic aneurysmal dilatation of the proximal LAD.  54 mL contrast utilized.  odynamically significant (iFR 1.0).      Impression and recommendations: Single-vessel proximal LAD high-grade stenosis with large poststenotic dilatation.  Best option would be to consider single-vessel CABG with LIMA to LAD especially in view of chronic thrombocytopenia and potential for long durable results in view of poststenotic aneurysmal dilatation of the proximal LAD.  54 mL contrast utilized.  ECHOCARDIOGRAM COMPLETE  Result Date: 04/29/2023    ECHOCARDIOGRAM REPORT   Patient Name:   Andrew [name] Date of Exam: 04/29/2023 Medical Rec #:  [id number]      Height:       74.0 in Accession #:    [id number]     Weight:       223.0 lb Date of Birth:  [birth year]     BSA:          2.276 m Patient Age:    82 years       BP:           140/94 mmHg Patient Gender: M              HR:           68 bpm. Exam Location:  [facility] Procedure: 2D Echo, Cardiac Doppler, Color Doppler and Intracardiac            Opacification Agent Indications:    R07.9* Chest pain, unspecified  History:        Patient has prior history of Echocardiogram examinations, most                 recent 08/14/2016. Previous Myocardial Infarction, Abnormal ECG,                  Arrythmias:Atrial Fibrillation, Signs/Symptoms:Chest Pain; Risk                 Factors:Hypertension. Leukemia.  Sonographer:    Sheralyn Boatman RDCS Referring Phys: [id number] Lamont Dowdy [facility]  Sonographer Comments: Technically difficult study due to poor echo windows. IMPRESSIONS  1. Mid to distal deptum, apical anterior segment, and apex are akinetic. No LV thrombus on contrast imaging. Other segments are hypokinetic. Left ventricular ejection fraction, by estimation, is 25 to 30%. The left ventricle has severely decreased function. The left ventricle demonstrates regional wall motion abnormalities (see scoring diagram/findings for description). The left ventricular internal cavity size was mildly dilated. There is mild left ventricular hypertrophy. Left ventricular diastolic parameters are indeterminate.  2. Right ventricular systolic function is normal. The right ventricular size is moderately enlarged. Tricuspid regurgitation signal is inadequate for assessing PA pressure.  3. Left atrial size was moderately dilated.  4. Right atrial size was severely dilated.  5. The mitral valve is normal in structure. Mild mitral valve regurgitation. No evidence of mitral stenosis.  6. The aortic valve is tricuspid. There is mild calcification of the aortic valve. There  is mild thickening of the aortic valve. Aortic valve regurgitation is trivial. Aortic valve sclerosis is present, with no evidence of aortic valve stenosis.  7. Aortic dilatation noted. There is moderate dilatation of the ascending aorta, measuring 44 mm.  8. The inferior vena cava is dilated in size with >50% respiratory variability, suggesting right atrial pressure of 8 mmHg. Comparison(s): The left ventricular function is significantly worse. Conclusion(s)/Recommendation(s): Findings consistent with ischemic cardiomyopathy. No left ventricular mural or apical thrombus/thrombi. FINDINGS  Left Ventricle: Mid to distal deptum, apical anterior segment, and apex are  akinetic. No LV thrombus on contrast imaging. Other segments are hypokinetic. Left ventricular ejection fraction, by estimation, is 25 to 30%. The left ventricle has severely decreased function. The left ventricle demonstrates regional wall motion abnormalities. Definity contrast agent was given IV to delineate the left ventricular endocardial borders. The left ventricular internal cavity size was mildly dilated. There is mild left ventricular hypertrophy. Left ventricular diastolic function could not be evaluated due to atrial fibrillation. Left ventricular diastolic parameters are indeterminate.  LV Wall Scoring: The mid and distal anterior septum, apical anterior segment, and apex are akinetic. Right Ventricle: The right ventricular size is moderately enlarged. No increase in right ventricular wall thickness. Right ventricular systolic function is normal. Tricuspid regurgitation signal is inadequate for assessing PA pressure. Left Atrium: Left atrial size was moderately dilated. Right Atrium: Right atrial size was severely dilated. Pericardium: There is no evidence of pericardial effusion. Mitral Valve: The mitral valve is normal in structure. Mild mitral valve regurgitation. No evidence of mitral valve stenosis. Tricuspid Valve: The tricuspid valve is normal in structure. Tricuspid valve regurgitation is mild . No evidence of tricuspid stenosis. Aortic Valve: The aortic valve is tricuspid. There is mild calcification of the aortic valve. There is mild thickening of the aortic valve. Aortic valve regurgitation is trivial. Aortic valve sclerosis is present, with no evidence of aortic valve stenosis. Pulmonic Valve: The pulmonic valve was grossly normal. Pulmonic valve regurgitation is mild. No evidence of pulmonic stenosis. Aorta: Aortic dilatation noted and the aortic root is normal in size and structure. There is moderate dilatation of the ascending aorta, measuring 44 mm. Venous: The inferior vena cava is  dilated in size with greater than 50% respiratory variability, suggesting right atrial pressure of 8 mmHg. IAS/Shunts: No atrial level shunt detected by color flow Doppler.  LEFT VENTRICLE PLAX 2D LVIDd:         5.60 cm LVIDs:         4.10 cm LV PW:         1.40 cm LV IVS:        1.20 cm LVOT diam:     2.55 cm LV SV:         81 LV SV Index:   36 LVOT Area:     5.11 cm  LV Volumes (MOD) LV vol d, MOD A2C: 152.0 ml LV vol d, MOD A4C: 160.5 ml LV vol s, MOD A2C: 118.5 ml LV vol s, MOD A4C: 97.3 ml LV SV MOD A2C:     33.5 ml LV SV MOD A4C:     160.5 ml LV SV MOD BP:      48.0 ml RIGHT VENTRICLE             IVC RV S prime:     13.20 cm/s  IVC diam: 2.60 cm TAPSE (M-mode): 1.7 cm LEFT ATRIUM  Index        RIGHT ATRIUM           Index LA diam:        4.30 cm  1.89 cm/m   RA Area:     29.60 cm LA Vol (A2C):   159.0 ml 69.85 ml/m  RA Volume:   98.20 ml  43.14 ml/m LA Vol (A4C):   103.0 ml 45.25 ml/m LA Biplane Vol: 129.0 ml 56.67 ml/m  AORTIC VALVE             PULMONIC VALVE LVOT Vmax:   86.40 cm/s  PR End Diast Vel: 1.33 msec LVOT Vmean:  51.400 cm/s LVOT VTI:    0.159 m  AORTA Ao Root diam: 3.90 cm Ao Asc diam:  4.40 cm MITRAL VALVE MV Area (PHT): 4.49 cm     SHUNTS MV Decel Time: 169 msec     Systemic VTI:  0.16 m MV E velocity: 105.00 cm/s  Systemic Diam: 2.55 cm Lennie Odor MD Electronically signed by Lennie Odor MD Signature Date/Time: 04/29/2023/3:50:35 PM    Final      I have independently reviewed the above radiologic studies and discussed with the patient   Recent Lab Findings: Lab Results  Component Value Date   WBC 3.2 (L) 05/01/2023   HGB 11.1 (L) 05/01/2023   HCT 32.9 (L) 05/01/2023   PLT 82 (L) 05/01/2023   GLUCOSE 115 (H) 05/01/2023   CHOL 148 04/30/2023   TRIG 95 04/30/2023   HDL 27 (L) 04/30/2023   LDLCALC 102 (H) 04/30/2023   ALT 15 01/09/2023   AST 17 01/09/2023   NA 142 05/01/2023   K 4.0 05/01/2023   CL 113 (H) 05/01/2023   CREATININE 3.38 (H) 05/01/2023    BUN 32 (H) 05/01/2023   CO2 21 (L) 05/01/2023   TSH 0.552 04/30/2023   INR 1.1 04/29/2023   Assessment / Plan:   S/p NSTEMI, coronary artery disease with decreased LVEF-LVEF per echo 25-30%. Patient has multiple risk factors that would make him a high risk candidate for CABG;however, Dr. Cliffton Asters will evaluate as to whether or not patient is a candidate for single vessel CABG (? off pump) 2.  Acute on chronic systolic CHF 3.  CKD (stage IV)-creatinine today 3.38. Of note, he has a left upper arm fistula but has never been on dialysis. Per patient, his creatine has somewhat improved with time. He is followed by Dr. Arrie Aran 4. History of permanent a fib-not on anticoagulation secondary to prior subdural hematoma and chronic thrombocytopenia 5. History of hyperlipidemia-on Atorvastatin 6. History of essential hypertension-on Coreg, Hydralazine, and imdur 7. History of hypothyroidism-on Levothyroxine 8. History of chronic normocytic anemia-on iron tablets three times weekly prior to admission 9. History of CLL-on Venetoclax  10. History of chronic thrombocytopenia related to above-platelets today decreased to 83,000  I  spent 20 minutes counseling the patient face to face.   Doree Fudge PA-C 05/01/2023 1:28 PM     Agree with above 82yo male with ostial LAD disease, CHF, and chronic afib.  He also has a hx of CKD, and CLL.  He is somewhat high risk due to his co-morbidities, but off-pump CABG and atriclip would be reasonable options for him.  Harrell Keane Scrape

## 2023-05-01 NOTE — Consult Note (Signed)
Pharmacy Consult Note - Anticoagulation  Pharmacy Consult for heparin Indication: chest pain/ACS  PATIENT MEASUREMENTS: Height: 6\' 2"  (188 cm) Weight: 102.3 kg (225 lb 9.8 oz) IBW/kg (Calculated) : 82.2 HEPARIN DW (KG): 101.2  VITAL SIGNS: Temp: 97.7 F (36.5 C) (12/03 0303) Temp Source: Oral (12/03 0303) BP: 149/86 (12/03 0621) Pulse Rate: 96 (12/03 0303)  Recent Labs    04/29/23 1111 04/29/23 1432 04/30/23 0342 04/30/23 1048 05/01/23 0257  HGB  --   --  11.6*  --  11.1*  HCT  --   --  33.6*  --  32.9*  PLT  --   --  83*  --  82*  APTT 25  --   --   --   --   LABPROT 14.0  --   --   --   --   INR 1.1  --   --   --   --   HEPARINUNFRC  --    < > 0.30   < > 0.31  CREATININE  --   --  3.38*  --  3.38*  TROPONINIHS  --    < > 6,501*  --   --    < > = values in this interval not displayed.    Estimated Creatinine Clearance: 21.1 mL/min (A) (by C-G formula based on SCr of 3.38 mg/dL (H)).  PAST MEDICAL HISTORY: Past Medical History:  Diagnosis Date   Arthritis    Ascending aortic aneurysm (HCC)    CKD (chronic kidney disease), stage IV (HCC)    followed [facility]   CLL (chronic lymphocytic leukemia) (HCC)    Complication of anesthesia    "wild when I woke up "   History of kidney stones    passed    Hypertension    NICM (nonischemic cardiomyopathy) (HCC)    a. cath 2007, EF 35%, minimal CAD. b. EF normal 2018.   PAF (paroxysmal atrial fibrillation) (HCC)    Pinched nerve in neck    Pneumonia    SDH (subdural hematoma) (HCC)    Thrombocytopenia (HCC)     ASSESSMENT: 82 y.o. male with PMH including ICH, chronic lymphocytic leukemia and Afib s/p ablation (not currently on anticoagulation) is presenting with chest pain and left shoulder pain. cTn are trending up and ECG exhibits nonspecific T wave abnormalities. Head CT did not show any evidence of ICH. Patient is not on chronic anticoagulation per chart review. Pharmacy has been consulted to initiate  and manage heparin intravenous infusion.  Heparin level 0.31 on lower end of therapeutic range on 1400 units/hr.  Hgb 11.1, pltc 82 (stable).  No issues noted.    Goal(s) of therapy: Heparin level 0.3 - 0.7 units/mL Monitor platelets by anticoagulation protocol: Yes   PLAN: Increase heparin infusion slightly to 1450 units/hr Daily CBC and heparin level while on heparin Monitor s/s bleeding  F/u after cath  Trixie Rude, PharmD Clinical Pharmacist 05/01/2023  7:28 AM

## 2023-05-01 NOTE — Progress Notes (Signed)
   Heart Failure Stewardship Pharmacist Progress Note   PCP: Benita Stabile, MD PCP-Cardiologist: Dina Rich, MD    HPI:  82 yo M with PMH of CHF, HTN, HLD, stage IV CLL, CKD IV, subdural hematoma, normocytic anemia, and afib.   Previous cardiac catheterization performed in January 2007 suggestive of nonischemic cardiomyopathy.  He had ejection fraction of 35% to time. Improved to 55-60% in 2018.   Presented to the ED on 12/1 with chest pain. CXR with cardiomegaly and pulmonary vascular congestion. Troponin 239>>3153. EKG with afib. ECHO 12/1 with LVEF 25-30%, RWMA, mild LVH, RV normal, mild MR. He has a left fistula placed several years ago but has not needed it since renal function improved.   Scheduled for cath 12/3.  Current HF Medications: Beta Blocker: carvedilol 6.25 mg BID Other: hydralazine 25 mg TID + Imdur 30 mg daily  Prior to admission HF Medications: Beta blocker: carvedilol 6.25 mg BID  Pertinent Lab Values: Serum creatinine 3.38, BUN 32, Potassium 4.0, Sodium 142, BNP 704, Magnesium 2.2   Vital Signs: Weight: 225 lbs (admission weight: 223 lbs) Blood pressure: 140/80s  Heart rate: 70s  I/O: net -0.5L yesterday; net -2.3L since admission  Medication Assistance / Insurance Benefits Check: Does the patient have prescription insurance?  Yes Type of insurance plan: [employer] Medicare  Outpatient Pharmacy:  Prior to admission outpatient [facility] Is the patient willing to use [facility] TOC pharmacy at discharge? Yes Is the patient willing to transition their outpatient pharmacy to utilize a [facility] outpatient pharmacy?   No    Assessment: 1. Acute on chronic systolic CHF (LVEF 25-30%), for cath on 12/3. NYHA class II symptoms. - Not volume overloaded on exam - Continue carvedilol 6.25 mg BID - No ACE/ARB/ARNI or MRA with advanced CKD - Continue hydralazine 25 mg TID + Imdur 30 mg daily - Consider SGLT2i pending renal function after cath   Plan: 1)  Medication changes recommended at this time: - None pending cath findings and renal function afterward  2) Patient assistance: - None pending  3)  Education  - Initial education completed - Full education to be completed prior to discharge  Sharen Hones, PharmD, BCPS Heart Failure Engineer, building services Phone [phone]

## 2023-05-01 NOTE — Interval H&P Note (Signed)
History and Physical Interval Note:  05/01/2023 11:24 AM  Andrew [name]  has presented today for surgery, with the diagnosis of nstemi.  The various methods of treatment have been discussed with the patient and family. After consideration of risks, benefits and other options for treatment, the patient has consented to  Procedure(s): LEFT HEART CATH AND CORONARY ANGIOGRAPHY (N/A) and possible coronary angioplasty as a surgical intervention.  The patient's history has been reviewed, patient examined, no change in status, stable for surgery.  I have reviewed the patient's chart and labs.  Questions were answered to the patient's satisfaction.     Yates Decamp

## 2023-05-01 NOTE — Progress Notes (Signed)
PROGRESS NOTE  Andrew [name]    DOB: 04-08-1941, 82 y.o.  BMW:[id number]    Code Status: Full Code   DOA: 04/29/2023   LOS: 2   Brief hospital course  Andrew [name] is a 82 y.o. male with a PMH significant for stage IV CLL, CKD stage IV, normocytic anemia, permanent atrial fibrillation not on anticoagulation due to prior subdural hematoma and chronic thrombocytopenia, HFpEF, ascending aortic aneurysm, and hypertension.  They presented from home to the ED on 04/29/2023 with chest discomfort x 2 days and radiated to the left shoulder. Found to have NSTEMI and started on heparin gtt and transferred from [facility] to [facility].   In the ED, it was found that they had stable vital signs.  Significant findings included Hemoglobin 12.9 and platelet count of 100,000, creatinine at baseline with 3.11 and troponin noted to be 239. Chest x-ray showing some mild cardiomegaly and pulmonary vascular congestion and shoulder x-ray negative for any acute findings. CT head with no acute findings.  They were initially treated with heparin gtt and aspirin   Patient was admitted to medicine service for further workup and management of NSTEMI as outlined in detail below.  05/01/23 -remains stable and chest pain free on heparin gtt. Renal function stable.   Assessment & Plan  Principal Problem:   NSTEMI (non-ST elevated myocardial infarction) (HCC) Active Problems:   CLL (chronic lymphocytic leukemia) (HCC)   Subdural hematoma (HCC)   A-fib (HCC)   CKD (chronic kidney disease) stage 5, GFR less than 15 ml/min (HCC)   Iron deficiency anemia   CKD (chronic kidney disease), stage IV (HCC)   HFrEF (heart failure with reduced ejection fraction) (HCC)   Atrial fibrillation, chronic (HCC)  Elevated troponin suspicious for NSTEMI- chest pain free currently. 2D echocardiogram completed with reduced EF compared to most recent in 2018. (EF 25-30%). Troponins peaked at 7,531 -Started on heparin drip after CT head noted to be  within normal limits -cardiology consulted, appreciate your recs -cath today -Continue telemetry monitoring -Continue Coreg as noted below   Permanent atrial fibrillation -Not on anticoagulation due to prior subdural hematoma -Monitor on telemetry -Continue Coreg 6.25 mg twice daily   Prior subdural hematoma-Status post craniotomy with evacuation 09/2016. At neurological baseline currently without focal changes -CT head with no acute findings -Monitor neurological status carefully while on a heparin drip   HFrEF  HTN -Prior LVEF 35%, but more recently 55 to 60% by echo 2018. Repeat echo on admission shows RF 25-30%.  -Has baseline dyspnea on exertion -Appears euvolemic - continue coreg, imdur, hydralazine   CKD stage IV-5- Cr is 3.3 again today which is his baseline. Not currently needing HD. UOP >1.5L overnight. Status post AV fistula, but not currently started on hemodialysis. -Being monitored by nephrology, Dr. Arrie Aran outpatient.  - monitor Cr am - IV fluids prior to cath and minimize contrast during cath as able.  - post-cath BMP and consult nephrology if significant increase - continue bicarb   CLL/chronic thrombocytopenia -Followed by Dr. Ellin Saba -Continue venetoclax   Chronic normocytic anemia -Continue to monitor -Iron tablets 3 times weekly    Body mass index is 28.97 kg/m.  VTE ppx: heparin gtt  Diet:     Diet   Diet NPO time specified Except for: Sips with Meds   Consultants: Cardiology   Subjective 05/01/23    Pt reports feeling well. Denies chest pain, nausea, diaphoresis. Denies leg swelling   Objective   Vitals:   04/30/23  2341 05/01/23 0303 05/01/23 0559 05/01/23 0621  BP: 128/69 (!) 140/88  (!) 149/86  Pulse: 85 96    Resp: 19     Temp: 98.1 F (36.7 C) 97.7 F (36.5 C)    TempSrc: Oral Oral    SpO2: 98% 96%    Weight:   102.3 kg   Height:        Intake/Output Summary (Last 24 hours) at 05/01/2023 0809 Last data filed at  05/01/2023 9563 Gross per 24 hour  Intake 709.06 ml  Output 1520 ml  Net -810.94 ml   Filed Weights   04/29/23 0820 04/30/23 0621 05/01/23 0559  Weight: 101.2 kg 102.9 kg 102.3 kg    Physical Exam:  General: awake, alert, NAD HEENT: atraumatic, clear conjunctiva, anicteric sclera, MMM, hearing grossly normal Respiratory: normal respiratory effort. Cardiovascular: quick capillary refill, normal S1/S2, RRR, no JVD, murmurs Nervous: A&O x3. no gross focal neurologic deficits, normal speech Extremities: moves all equally, no edema, normal tone Skin: dry, intact, normal temperature, normal color. No rashes, lesions or ulcers on exposed skin Psychiatry: normal mood, congruent affect  Labs   I have personally reviewed the following labs and imaging studies CBC    Component Value Date/Time   WBC 3.2 (L) 05/01/2023 0257   RBC 3.29 (L) 05/01/2023 0257   HGB 11.1 (L) 05/01/2023 0257   HCT 32.9 (L) 05/01/2023 0257   PLT 82 (L) 05/01/2023 0257   MCV 100.0 05/01/2023 0257   MCH 33.7 05/01/2023 0257   MCHC 33.7 05/01/2023 0257   RDW 12.7 05/01/2023 0257   LYMPHSABS 0.4 (L) 01/09/2023 0850   MONOABS 0.4 01/09/2023 0850   EOSABS 0.0 01/09/2023 0850   BASOSABS 0.0 01/09/2023 0850      Latest Ref Rng & Units 05/01/2023    2:57 AM 04/30/2023    3:42 AM 04/29/2023    9:04 AM  BMP  Glucose 70 - 99 mg/dL 875  643  329   BUN 8 - 23 mg/dL 32  37  36   Creatinine 0.61 - 1.24 mg/dL 5.18  8.41  6.60   Sodium 135 - 145 mmol/L 142  139  140   Potassium 3.5 - 5.1 mmol/L 4.0  3.8  4.1   Chloride 98 - 111 mmol/L 113  110  107   CO2 22 - 32 mmol/L 21  22  23    Calcium 8.9 - 10.3 mg/dL 8.6  8.9  9.1     ECHOCARDIOGRAM COMPLETE  Result Date: 04/29/2023    ECHOCARDIOGRAM REPORT   Patient Name:   Andrew [name] Date of Exam: 04/29/2023 Medical Rec #:  [id number]      Height:       74.0 in Accession #:    [id number]     Weight:       223.0 lb Date of Birth:  1940/12/25     BSA:          2.276 m Patient  Age:    82 years       BP:           140/94 mmHg Patient Gender: M              HR:           68 bpm. [facility] Procedure: 2D Echo, Cardiac Doppler, Color Doppler and Intracardiac            Opacification Agent Indications:    R07.9* Chest pain, unspecified  History:  Patient has prior history of Echocardiogram examinations, most                 recent 08/14/2016. Previous Myocardial Infarction, Abnormal ECG,                 Arrythmias:Atrial Fibrillation, Signs/Symptoms:Chest Pain; Risk                 Factors:Hypertension. Leukemia.  Sonographer:    Sheralyn Boatman RDCS Referring Phys: [id number] Lamont Dowdy [facility]  Sonographer Comments: Technically difficult study due to poor echo windows. IMPRESSIONS  1. Mid to distal deptum, apical anterior segment, and apex are akinetic. No LV thrombus on contrast imaging. Other segments are hypokinetic. Left ventricular ejection fraction, by estimation, is 25 to 30%. The left ventricle has severely decreased function. The left ventricle demonstrates regional wall motion abnormalities (see scoring diagram/findings for description). The left ventricular internal cavity size was mildly dilated. There is mild left ventricular hypertrophy. Left ventricular diastolic parameters are indeterminate.  2. Right ventricular systolic function is normal. The right ventricular size is moderately enlarged. Tricuspid regurgitation signal is inadequate for assessing PA pressure.  3. Left atrial size was moderately dilated.  4. Right atrial size was severely dilated.  5. The mitral valve is normal in structure. Mild mitral valve regurgitation. No evidence of mitral stenosis.  6. The aortic valve is tricuspid. There is mild calcification of the aortic valve. There is mild thickening of the aortic valve. Aortic valve regurgitation is trivial. Aortic valve sclerosis is present, with no evidence of aortic valve stenosis.  7. Aortic dilatation noted. There is moderate dilatation of the  ascending aorta, measuring 44 mm.  8. The inferior vena cava is dilated in size with >50% respiratory variability, suggesting right atrial pressure of 8 mmHg. Comparison(s): The left ventricular function is significantly worse. Conclusion(s)/Recommendation(s): Findings consistent with ischemic cardiomyopathy. No left ventricular mural or apical thrombus/thrombi. FINDINGS  Left Ventricle: Mid to distal deptum, apical anterior segment, and apex are akinetic. No LV thrombus on contrast imaging. Other segments are hypokinetic. Left ventricular ejection fraction, by estimation, is 25 to 30%. The left ventricle has severely decreased function. The left ventricle demonstrates regional wall motion abnormalities. Definity contrast agent was given IV to delineate the left ventricular endocardial borders. The left ventricular internal cavity size was mildly dilated. There is mild left ventricular hypertrophy. Left ventricular diastolic function could not be evaluated due to atrial fibrillation. Left ventricular diastolic parameters are indeterminate.  LV Wall Scoring: The mid and distal anterior septum, apical anterior segment, and apex are akinetic. Right Ventricle: The right ventricular size is moderately enlarged. No increase in right ventricular wall thickness. Right ventricular systolic function is normal. Tricuspid regurgitation signal is inadequate for assessing PA pressure. Left Atrium: Left atrial size was moderately dilated. Right Atrium: Right atrial size was severely dilated. Pericardium: There is no evidence of pericardial effusion. Mitral Valve: The mitral valve is normal in structure. Mild mitral valve regurgitation. No evidence of mitral valve stenosis. Tricuspid Valve: The tricuspid valve is normal in structure. Tricuspid valve regurgitation is mild . No evidence of tricuspid stenosis. Aortic Valve: The aortic valve is tricuspid. There is mild calcification of the aortic valve. There is mild thickening of the  aortic valve. Aortic valve regurgitation is trivial. Aortic valve sclerosis is present, with no evidence of aortic valve stenosis. Pulmonic Valve: The pulmonic valve was grossly normal. Pulmonic valve regurgitation is mild. No evidence of pulmonic stenosis. Aorta: Aortic dilatation noted and the aortic  root is normal in size and structure. There is moderate dilatation of the ascending aorta, measuring 44 mm. Venous: The inferior vena cava is dilated in size with greater than 50% respiratory variability, suggesting right atrial pressure of 8 mmHg. IAS/Shunts: No atrial level shunt detected by color flow Doppler.  LEFT VENTRICLE PLAX 2D LVIDd:         5.60 cm LVIDs:         4.10 cm LV PW:         1.40 cm LV IVS:        1.20 cm LVOT diam:     2.55 cm LV SV:         81 LV SV Index:   36 LVOT Area:     5.11 cm  LV Volumes (MOD) LV vol d, MOD A2C: 152.0 ml LV vol d, MOD A4C: 160.5 ml LV vol s, MOD A2C: 118.5 ml LV vol s, MOD A4C: 97.3 ml LV SV MOD A2C:     33.5 ml LV SV MOD A4C:     160.5 ml LV SV MOD BP:      48.0 ml RIGHT VENTRICLE             IVC RV S prime:     13.20 cm/s  IVC diam: 2.60 cm TAPSE (M-mode): 1.7 cm LEFT ATRIUM              Index        RIGHT ATRIUM           Index LA diam:        4.30 cm  1.89 cm/m   RA Area:     29.60 cm LA Vol (A2C):   159.0 ml 69.85 ml/m  RA Volume:   98.20 ml  43.14 ml/m LA Vol (A4C):   103.0 ml 45.25 ml/m LA Biplane Vol: 129.0 ml 56.67 ml/m  AORTIC VALVE             PULMONIC VALVE LVOT Vmax:   86.40 cm/s  PR End Diast Vel: 1.33 msec LVOT Vmean:  51.400 cm/s LVOT VTI:    0.159 m  AORTA Ao Root diam: 3.90 cm Ao Asc diam:  4.40 cm MITRAL VALVE MV Area (PHT): 4.49 cm     SHUNTS MV Decel Time: 169 msec     Systemic VTI:  0.16 m MV E velocity: 105.00 cm/s  Systemic Diam: 2.55 cm Lennie Odor MD Electronically signed by Lennie Odor MD Signature Date/Time: 04/29/2023/3:50:35 PM    Final    CT Head Wo Contrast  Result Date: 04/29/2023 CLINICAL DATA:  Chest pain. Personal  history of intracranial hemorrhage. Need to start heparin. EXAM: CT HEAD WITHOUT CONTRAST TECHNIQUE: Contiguous axial images were obtained from the base of the skull through the vertex without intravenous contrast. RADIATION DOSE REDUCTION: This exam was performed according to the departmental dose-optimization program which includes automated exposure control, adjustment of the mA and/or kV according to patient size and/or use of iterative reconstruction technique. COMPARISON:  CT head without contrast 12/11/2016 FINDINGS: Brain: Mild atrophy and white matter changes are stable. The ventricles are of normal size. No significant extraaxial fluid collection is present. The brainstem and cerebellum are within normal limits. Craniotomy noted. No residual or recurrent intracranial hemorrhage is present. Vascular: No hyperdense vessel or unexpected calcification. Skull: Right frontal parietal craniotomy noted. Calvarium is otherwise intact. No significant extracranial soft tissue lesion is present. Sinuses/Orbits: The paranasal sinuses and mastoid air cells are clear. Bilateral lens  replacements are noted. Globes and orbits are otherwise unremarkable. IMPRESSION: 1. No acute intracranial abnormality or significant interval change. 2. Stable atrophy and white matter disease. This likely reflects the sequela of chronic microvascular ischemia. 3. Right frontal parietal craniotomy without residual or recurrent intracranial hemorrhage. These results were called by telephone at the time of interpretation on 04/29/2023 at 10:48 am to provider Vonita Moss , who verbally acknowledged these results. Electronically Signed   By: Marin Roberts M.D.   On: 04/29/2023 11:09   DG Shoulder Left  Result Date: 04/29/2023 CLINICAL DATA:  82 year old male with history of left shoulder pain. EXAM: LEFT SHOULDER - 2+ VIEW COMPARISON:  No priors. FINDINGS: Three views of the left shoulder demonstrate no acute displaced fracture,  subluxation or dislocation. There is joint space narrowing, subchondral sclerosis, subchondral cyst formation and osteophyte formation noted in the glenohumeral joint. IMPRESSION: 1. No acute abnormality of the left shoulder. 2. Glenohumeral joint osteoarthritis, as above. Electronically Signed   By: Trudie Reed M.D.   On: 04/29/2023 08:58   DG Chest 2 View  Result Date: 04/29/2023 CLINICAL DATA:  82 year old male with history of chest pain. EXAM: CHEST - 2 VIEW COMPARISON:  Chest x-ray 08/22/2017. FINDINGS: Lung volumes are normal. No consolidative airspace disease. No pleural effusions. No pneumothorax. No pulmonary nodule or mass noted. Pulmonary vasculature appears engorged. Heart size is mildly enlarged. Upper mediastinal contours are within normal limits. Atherosclerotic calcifications are noted in the thoracic aorta. Atherosclerosis in the thoracic aorta. IMPRESSION: 1. Cardiomegaly with pulmonary venous congestion, but no frank pulmonary edema. 2. Aortic atherosclerosis. Electronically Signed   By: Trudie Reed M.D.   On: 04/29/2023 08:57    Disposition Plan & Communication  Patient status: Inpatient  Admitted From: Home Planned disposition location: Home Anticipated discharge date: 12/4 pending cardiology clearance   Family Communication: wife and daughters at bedside    Author: Leeroy Bock, DO [facility] 05/01/2023, 8:09 AM   Available by Epic secure chat 7AM-7PM. If 7PM-7AM, please contact night-coverage.  [facility] contact information found on [email or web address].

## 2023-05-01 NOTE — Progress Notes (Signed)
Rounding Note    Patient Name: Andrew [name] Date of Encounter: 05/01/2023  [facility] Cardiologist: Dina Rich, MD   Subjective   Admitted with non-STEMI.  No further chest pain on IV heparin.  Plan cath today.  Inpatient Medications    Scheduled Meds:  aspirin EC  81 mg Oral Daily   atorvastatin  80 mg Oral Daily   carvedilol  6.25 mg Oral BID   cholecalciferol  1,000 Units Oral Daily   cyanocobalamin  1,000 mcg Oral Daily   ferrous sulfate  325 mg Oral Once per day on Monday Wednesday Friday   hydrALAZINE  25 mg Oral Q8H   isosorbide mononitrate  30 mg Oral Daily   levothyroxine  100 mcg Oral Daily   multivitamin with minerals  1 tablet Oral Daily   pantoprazole  40 mg Oral Daily   sodium bicarbonate  650 mg Oral TID   tamsulosin  0.4 mg Oral QPC supper   venetoclax  300 mg Oral Q supper   Continuous Infusions:  sodium chloride 75 mL/hr at 05/01/23 0854   sodium chloride Stopped (05/01/23 0854)   heparin 1,450 Units/hr (05/01/23 0743)   PRN Meds: acetaminophen **OR** acetaminophen, ondansetron **OR** ondansetron (ZOFRAN) IV   Vital Signs    Vitals:   04/30/23 2341 05/01/23 0303 05/01/23 0559 05/01/23 0621  BP: 128/69 (!) 140/88  (!) 149/86  Pulse: 85 96    Resp: 19     Temp: 98.1 F (36.7 C) 97.7 F (36.5 C)    TempSrc: Oral Oral    SpO2: 98% 96%    Weight:   102.3 kg   Height:        Intake/Output Summary (Last 24 hours) at 05/01/2023 1050 Last data filed at 05/01/2023 0900 Gross per 24 hour  Intake 709.06 ml  Output 2320 ml  Net -1610.94 ml      05/01/2023    5:59 AM 04/30/2023    6:21 AM 04/29/2023    8:20 AM  Last 3 Weights  Weight (lbs) 225 lb 9.8 oz 226 lb 13.7 oz 223 lb  Weight (kg) 102.336 kg 102.9 kg 101.152 kg      Telemetry    Atrial fibrillation with controlled ventricular response- Personally Reviewed  ECG    Not performed today- Personally Reviewed  Physical Exam   GEN: No acute distress.   Neck: No  JVD Cardiac: Irregularly irregular, no murmurs, rubs, or gallops.  Respiratory: Clear to auscultation bilaterally. GI: Soft, nontender, non-distended  MS: No edema; No deformity. Neuro:  Nonfocal  Psych: Normal affect   Labs    High Sensitivity Troponin:   Recent Labs  Lab 04/29/23 1029 04/29/23 1432 04/29/23 2040 04/29/23 2311 04/30/23 0342  TROPONINIHS 453* 3,153* 6,724* 7,531* 6,501*     Chemistry Recent Labs  Lab 04/29/23 0904 04/30/23 0342 05/01/23 0257  NA 140 139 142  K 4.1 3.8 4.0  CL 107 110 113*  CO2 23 22 21*  GLUCOSE 141* 106* 115*  BUN 36* 37* 32*  CREATININE 3.11* 3.38* 3.38*  CALCIUM 9.1 8.9 8.6*  MG  --  2.2  --   GFRNONAA 19* 17* 17*  ANIONGAP 10 7 8     Lipids  Recent Labs  Lab 04/30/23 0342  CHOL 148  TRIG 95  HDL 27*  LDLCALC 102*  CHOLHDL 5.5    Hematology Recent Labs  Lab 04/29/23 0904 04/30/23 0342 05/01/23 0257  WBC 3.1* 3.0* 3.2*  RBC 3.68* 3.35* 3.29*  HGB 12.9* 11.6* 11.1*  HCT 37.5* 33.6* 32.9*  MCV 101.9* 100.3* 100.0  MCH 35.1* 34.6* 33.7  MCHC 34.4 34.5 33.7  RDW 12.7 12.7 12.7  PLT 100* 83* 82*   Thyroid  Recent Labs  Lab 04/30/23 0342  TSH 0.552    BNP Recent Labs  Lab 04/29/23 2040  BNP 704.0*    DDimer No results for input(s): "DDIMER" in the last 168 hours.   Radiology    ECHOCARDIOGRAM COMPLETE  Result Date: 04/29/2023    ECHOCARDIOGRAM REPORT   Patient Name:   Andrew [name] Date of Exam: 04/29/2023 Medical Rec #:  [id number]      Height:       74.0 in Accession #:    [id number]     Weight:       223.0 lb Date of Birth:  1941/02/08     BSA:          2.276 m Patient Age:    82 years       BP:           140/94 mmHg Patient Gender: M              HR:           68 bpm. [facility] Procedure: 2D Echo, Cardiac Doppler, Color Doppler and Intracardiac            Opacification Agent Indications:    R07.9* Chest pain, unspecified  History:        Patient has prior history of Echocardiogram  examinations, most                 recent 08/14/2016. Previous Myocardial Infarction, Abnormal ECG,                 Arrythmias:Atrial Fibrillation, Signs/Symptoms:Chest Pain; Risk                 Factors:Hypertension. Leukemia.  Sonographer:    Sheralyn Boatman RDCS Referring Phys: [id number] [facility]  Sonographer Comments: Technically difficult study due to poor echo windows. IMPRESSIONS  1. Mid to distal deptum, apical anterior segment, and apex are akinetic. No LV thrombus on contrast imaging. Other segments are hypokinetic. Left ventricular ejection fraction, by estimation, is 25 to 30%. The left ventricle has severely decreased function. The left ventricle demonstrates regional wall motion abnormalities (see scoring diagram/findings for description). The left ventricular internal cavity size was mildly dilated. There is mild left ventricular hypertrophy. Left ventricular diastolic parameters are indeterminate.  2. Right ventricular systolic function is normal. The right ventricular size is moderately enlarged. Tricuspid regurgitation signal is inadequate for assessing PA pressure.  3. Left atrial size was moderately dilated.  4. Right atrial size was severely dilated.  5. The mitral valve is normal in structure. Mild mitral valve regurgitation. No evidence of mitral stenosis.  6. The aortic valve is tricuspid. There is mild calcification of the aortic valve. There is mild thickening of the aortic valve. Aortic valve regurgitation is trivial. Aortic valve sclerosis is present, with no evidence of aortic valve stenosis.  7. Aortic dilatation noted. There is moderate dilatation of the ascending aorta, measuring 44 mm.  8. The inferior vena cava is dilated in size with >50% respiratory variability, suggesting right atrial pressure of 8 mmHg. Comparison(s): The left ventricular function is significantly worse. Conclusion(s)/Recommendation(s): Findings consistent with ischemic cardiomyopathy. No left ventricular mural or  apical thrombus/thrombi. FINDINGS  Left Ventricle: Mid to distal deptum, apical anterior  segment, and apex are akinetic. No LV thrombus on contrast imaging. Other segments are hypokinetic. Left ventricular ejection fraction, by estimation, is 25 to 30%. The left ventricle has severely decreased function. The left ventricle demonstrates regional wall motion abnormalities. Definity contrast agent was given IV to delineate the left ventricular endocardial borders. The left ventricular internal cavity size was mildly dilated. There is mild left ventricular hypertrophy. Left ventricular diastolic function could not be evaluated due to atrial fibrillation. Left ventricular diastolic parameters are indeterminate.  LV Wall Scoring: The mid and distal anterior septum, apical anterior segment, and apex are akinetic. Right Ventricle: The right ventricular size is moderately enlarged. No increase in right ventricular wall thickness. Right ventricular systolic function is normal. Tricuspid regurgitation signal is inadequate for assessing PA pressure. Left Atrium: Left atrial size was moderately dilated. Right Atrium: Right atrial size was severely dilated. Pericardium: There is no evidence of pericardial effusion. Mitral Valve: The mitral valve is normal in structure. Mild mitral valve regurgitation. No evidence of mitral valve stenosis. Tricuspid Valve: The tricuspid valve is normal in structure. Tricuspid valve regurgitation is mild . No evidence of tricuspid stenosis. Aortic Valve: The aortic valve is tricuspid. There is mild calcification of the aortic valve. There is mild thickening of the aortic valve. Aortic valve regurgitation is trivial. Aortic valve sclerosis is present, with no evidence of aortic valve stenosis. Pulmonic Valve: The pulmonic valve was grossly normal. Pulmonic valve regurgitation is mild. No evidence of pulmonic stenosis. Aorta: Aortic dilatation noted and the aortic root is normal in size and  structure. There is moderate dilatation of the ascending aorta, measuring 44 mm. Venous: The inferior vena cava is dilated in size with greater than 50% respiratory variability, suggesting right atrial pressure of 8 mmHg. IAS/Shunts: No atrial level shunt detected by color flow Doppler.  LEFT VENTRICLE PLAX 2D LVIDd:         5.60 cm LVIDs:         4.10 cm LV PW:         1.40 cm LV IVS:        1.20 cm LVOT diam:     2.55 cm LV SV:         81 LV SV Index:   36 LVOT Area:     5.11 cm  LV Volumes (MOD) LV vol d, MOD A2C: 152.0 ml LV vol d, MOD A4C: 160.5 ml LV vol s, MOD A2C: 118.5 ml LV vol s, MOD A4C: 97.3 ml LV SV MOD A2C:     33.5 ml LV SV MOD A4C:     160.5 ml LV SV MOD BP:      48.0 ml RIGHT VENTRICLE             IVC RV S prime:     13.20 cm/s  IVC diam: 2.60 cm TAPSE (M-mode): 1.7 cm LEFT ATRIUM              Index        RIGHT ATRIUM           Index LA diam:        4.30 cm  1.89 cm/m   RA Area:     29.60 cm LA Vol (A2C):   159.0 ml 69.85 ml/m  RA Volume:   98.20 ml  43.14 ml/m LA Vol (A4C):   103.0 ml 45.25 ml/m LA Biplane Vol: 129.0 ml 56.67 ml/m  AORTIC VALVE  PULMONIC VALVE LVOT Vmax:   86.40 cm/s  PR End Diast Vel: 1.33 msec LVOT Vmean:  51.400 cm/s LVOT VTI:    0.159 m  AORTA Ao Root diam: 3.90 cm Ao Asc diam:  4.40 cm MITRAL VALVE MV Area (PHT): 4.49 cm     SHUNTS MV Decel Time: 169 msec     Systemic VTI:  0.16 m MV E velocity: 105.00 cm/s  Systemic Diam: 2.55 cm Lennie Odor MD Electronically signed by Lennie Odor MD Signature Date/Time: 04/29/2023/3:50:35 PM    Final    CT Head Wo Contrast  Result Date: 04/29/2023 CLINICAL DATA:  Chest pain. Personal history of intracranial hemorrhage. Need to start heparin. EXAM: CT HEAD WITHOUT CONTRAST TECHNIQUE: Contiguous axial images were obtained from the base of the skull through the vertex without intravenous contrast. RADIATION DOSE REDUCTION: This exam was performed according to the departmental dose-optimization program which includes  automated exposure control, adjustment of the mA and/or kV according to patient size and/or use of iterative reconstruction technique. COMPARISON:  CT head without contrast 12/11/2016 FINDINGS: Brain: Mild atrophy and white matter changes are stable. The ventricles are of normal size. No significant extraaxial fluid collection is present. The brainstem and cerebellum are within normal limits. Craniotomy noted. No residual or recurrent intracranial hemorrhage is present. Vascular: No hyperdense vessel or unexpected calcification. Skull: Right frontal parietal craniotomy noted. Calvarium is otherwise intact. No significant extracranial soft tissue lesion is present. Sinuses/Orbits: The paranasal sinuses and mastoid air cells are clear. Bilateral lens replacements are noted. Globes and orbits are otherwise unremarkable. IMPRESSION: 1. No acute intracranial abnormality or significant interval change. 2. Stable atrophy and white matter disease. This likely reflects the sequela of chronic microvascular ischemia. 3. Right frontal parietal craniotomy without residual or recurrent intracranial hemorrhage. These results were called by telephone at the time of interpretation on 04/29/2023 at 10:48 am to provider Vonita Moss , who verbally acknowledged these results. Electronically Signed   By: Marin Roberts M.D.   On: 04/29/2023 11:09    Cardiac Studies   2D echo (04/29/2023)  IMPRESSIONS     1. Mid to distal deptum, apical anterior segment, and apex are akinetic.  No LV thrombus on contrast imaging. Other segments are hypokinetic. Left  ventricular ejection fraction, by estimation, is 25 to 30%. The left  ventricle has severely decreased  function. The left ventricle demonstrates regional wall motion  abnormalities (see scoring diagram/findings for description). The left  ventricular internal cavity size was mildly dilated. There is mild left  ventricular hypertrophy. Left ventricular  diastolic  parameters are indeterminate.   2. Right ventricular systolic function is normal. The right ventricular  size is moderately enlarged. Tricuspid regurgitation signal is inadequate  for assessing PA pressure.   3. Left atrial size was moderately dilated.   4. Right atrial size was severely dilated.   5. The mitral valve is normal in structure. Mild mitral valve  regurgitation. No evidence of mitral stenosis.   6. The aortic valve is tricuspid. There is mild calcification of the  aortic valve. There is mild thickening of the aortic valve. Aortic valve  regurgitation is trivial. Aortic valve sclerosis is present, with no  evidence of aortic valve stenosis.   7. Aortic dilatation noted. There is moderate dilatation of the ascending  aorta, measuring 44 mm.   8. The inferior vena cava is dilated in size with >50% respiratory  variability, suggesting right atrial pressure of 8 mmHg.   Patient Profile  Andrew [name] is a 82 y.o. 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 Lowell Guitar.   Assessment & Plan    1: Non-STEMI-troponins increased to 7500.  Currently pain-free on IV heparin.  Plan diagnostic coronary angiography tomorrow.  I have reviewed the risks, indications, and alternatives to cardiac catheterization, possible angioplasty, and stenting with the patient. Risks include but are not limited to bleeding, infection, vascular injury, stroke, myocardial infection, arrhythmia, kidney injury, radiation-related injury in the case of prolonged fluoroscopy use, emergency cardiac surgery, and death. The patient understands the risks of serious  complication is 1-2 in 1000 with diagnostic cardiac cath and 1-2% or less with angioplasty/stenting.  2: CKD 4-creatinine is in the low 3 range.  He does have a left arm AV fistula in anticipation potential dialysis.  We talked about the possibility of radiocontrast nephropathy based on cath but have decided that the risk benefit ratio favors coronary angiography potential intervention.  3: Permanent A-fib-not on anticoagulation because history of prior intracranial bleed  4: Essential hypertension-well-controlled on current medications new problem  5: Hyperlipidemia-LDL of 102 on atorvastatin 80 mg.  Will continue to follow  6: LV dysfunction-EF 25 to 30%, new since his last echo performed 10/14/2016 at which time it was normal.  I suspect this is ischemically mediated.  Renal dysfunction precludes optimal GDMT.  Will wait to see the results of his cardiac catheterization.      For questions or updates, please contact [facility] Please consult [email or web address] for contact info under        Signed, Nanetta Batty, MD  05/01/2023, 10:50 AM

## 2023-05-01 NOTE — Plan of Care (Signed)

## 2023-05-01 NOTE — H&P (View-Only) (Signed)
Rounding Note    Patient Name: Andrew [name] Date of Encounter: 05/01/2023  [facility] Cardiologist: Dina Rich, MD   Subjective   Admitted with non-STEMI.  No further chest pain on IV heparin.  Plan cath today.  Inpatient Medications    Scheduled Meds:  aspirin EC  81 mg Oral Daily   atorvastatin  80 mg Oral Daily   carvedilol  6.25 mg Oral BID   cholecalciferol  1,000 Units Oral Daily   cyanocobalamin  1,000 mcg Oral Daily   ferrous sulfate  325 mg Oral Once per day on Monday Wednesday Friday   hydrALAZINE  25 mg Oral Q8H   isosorbide mononitrate  30 mg Oral Daily   levothyroxine  100 mcg Oral Daily   multivitamin with minerals  1 tablet Oral Daily   pantoprazole  40 mg Oral Daily   sodium bicarbonate  650 mg Oral TID   tamsulosin  0.4 mg Oral QPC supper   venetoclax  300 mg Oral Q supper   Continuous Infusions:  sodium chloride 75 mL/hr at 05/01/23 0854   sodium chloride Stopped (05/01/23 0854)   heparin 1,450 Units/hr (05/01/23 0743)   PRN Meds: acetaminophen **OR** acetaminophen, ondansetron **OR** ondansetron (ZOFRAN) IV   Vital Signs    Vitals:   04/30/23 2341 05/01/23 0303 05/01/23 0559 05/01/23 0621  BP: 128/69 (!) 140/88  (!) 149/86  Pulse: 85 96    Resp: 19     Temp: 98.1 F (36.7 C) 97.7 F (36.5 C)    TempSrc: Oral Oral    SpO2: 98% 96%    Weight:   102.3 kg   Height:        Intake/Output Summary (Last 24 hours) at 05/01/2023 1050 Last data filed at 05/01/2023 0900 Gross per 24 hour  Intake 709.06 ml  Output 2320 ml  Net -1610.94 ml      05/01/2023    5:59 AM 04/30/2023    6:21 AM 04/29/2023    8:20 AM  Last 3 Weights  Weight (lbs) 225 lb 9.8 oz 226 lb 13.7 oz 223 lb  Weight (kg) 102.336 kg 102.9 kg 101.152 kg      Telemetry    Atrial fibrillation with controlled ventricular response- Personally Reviewed  ECG    Not performed today- Personally Reviewed  Physical Exam   GEN: No acute distress.   Neck: No  JVD Cardiac: Irregularly irregular, no murmurs, rubs, or gallops.  Respiratory: Clear to auscultation bilaterally. GI: Soft, nontender, non-distended  MS: No edema; No deformity. Neuro:  Nonfocal  Psych: Normal affect   Labs    High Sensitivity Troponin:   Recent Labs  Lab 04/29/23 1029 04/29/23 1432 04/29/23 2040 04/29/23 2311 04/30/23 0342  TROPONINIHS 453* 3,153* 6,724* 7,531* 6,501*     Chemistry Recent Labs  Lab 04/29/23 0904 04/30/23 0342 05/01/23 0257  NA 140 139 142  K 4.1 3.8 4.0  CL 107 110 113*  CO2 23 22 21*  GLUCOSE 141* 106* 115*  BUN 36* 37* 32*  CREATININE 3.11* 3.38* 3.38*  CALCIUM 9.1 8.9 8.6*  MG  --  2.2  --   GFRNONAA 19* 17* 17*  ANIONGAP 10 7 8     Lipids  Recent Labs  Lab 04/30/23 0342  CHOL 148  TRIG 95  HDL 27*  LDLCALC 102*  CHOLHDL 5.5    Hematology Recent Labs  Lab 04/29/23 0904 04/30/23 0342 05/01/23 0257  WBC 3.1* 3.0* 3.2*  RBC 3.68* 3.35* 3.29*  HGB 12.9* 11.6* 11.1*  HCT 37.5* 33.6* 32.9*  MCV 101.9* 100.3* 100.0  MCH 35.1* 34.6* 33.7  MCHC 34.4 34.5 33.7  RDW 12.7 12.7 12.7  PLT 100* 83* 82*   Thyroid  Recent Labs  Lab 04/30/23 0342  TSH 0.552    BNP Recent Labs  Lab 04/29/23 2040  BNP 704.0*    DDimer No results for input(s): "DDIMER" in the last 168 hours.   Radiology    ECHOCARDIOGRAM COMPLETE  Result Date: 04/29/2023    ECHOCARDIOGRAM REPORT   Patient Name:   Andrew [name] Date of Exam: 04/29/2023 Medical Rec #:  [id number]      Height:       74.0 in Accession #:    [id number]     Weight:       223.0 lb Date of Birth:  1941/02/08     BSA:          2.276 m Patient Age:    82 years       BP:           140/94 mmHg Patient Gender: M              HR:           68 bpm. [facility] Procedure: 2D Echo, Cardiac Doppler, Color Doppler and Intracardiac            Opacification Agent Indications:    R07.9* Chest pain, unspecified  History:        Patient has prior history of Echocardiogram  examinations, most                 recent 08/14/2016. Previous Myocardial Infarction, Abnormal ECG,                 Arrythmias:Atrial Fibrillation, Signs/Symptoms:Chest Pain; Risk                 Factors:Hypertension. Leukemia.  Sonographer:    Sheralyn Boatman RDCS Referring Phys: [id number] [facility]  Sonographer Comments: Technically difficult study due to poor echo windows. IMPRESSIONS  1. Mid to distal deptum, apical anterior segment, and apex are akinetic. No LV thrombus on contrast imaging. Other segments are hypokinetic. Left ventricular ejection fraction, by estimation, is 25 to 30%. The left ventricle has severely decreased function. The left ventricle demonstrates regional wall motion abnormalities (see scoring diagram/findings for description). The left ventricular internal cavity size was mildly dilated. There is mild left ventricular hypertrophy. Left ventricular diastolic parameters are indeterminate.  2. Right ventricular systolic function is normal. The right ventricular size is moderately enlarged. Tricuspid regurgitation signal is inadequate for assessing PA pressure.  3. Left atrial size was moderately dilated.  4. Right atrial size was severely dilated.  5. The mitral valve is normal in structure. Mild mitral valve regurgitation. No evidence of mitral stenosis.  6. The aortic valve is tricuspid. There is mild calcification of the aortic valve. There is mild thickening of the aortic valve. Aortic valve regurgitation is trivial. Aortic valve sclerosis is present, with no evidence of aortic valve stenosis.  7. Aortic dilatation noted. There is moderate dilatation of the ascending aorta, measuring 44 mm.  8. The inferior vena cava is dilated in size with >50% respiratory variability, suggesting right atrial pressure of 8 mmHg. Comparison(s): The left ventricular function is significantly worse. Conclusion(s)/Recommendation(s): Findings consistent with ischemic cardiomyopathy. No left ventricular mural or  apical thrombus/thrombi. FINDINGS  Left Ventricle: Mid to distal deptum, apical anterior  segment, and apex are akinetic. No LV thrombus on contrast imaging. Other segments are hypokinetic. Left ventricular ejection fraction, by estimation, is 25 to 30%. The left ventricle has severely decreased function. The left ventricle demonstrates regional wall motion abnormalities. Definity contrast agent was given IV to delineate the left ventricular endocardial borders. The left ventricular internal cavity size was mildly dilated. There is mild left ventricular hypertrophy. Left ventricular diastolic function could not be evaluated due to atrial fibrillation. Left ventricular diastolic parameters are indeterminate.  LV Wall Scoring: The mid and distal anterior septum, apical anterior segment, and apex are akinetic. Right Ventricle: The right ventricular size is moderately enlarged. No increase in right ventricular wall thickness. Right ventricular systolic function is normal. Tricuspid regurgitation signal is inadequate for assessing PA pressure. Left Atrium: Left atrial size was moderately dilated. Right Atrium: Right atrial size was severely dilated. Pericardium: There is no evidence of pericardial effusion. Mitral Valve: The mitral valve is normal in structure. Mild mitral valve regurgitation. No evidence of mitral valve stenosis. Tricuspid Valve: The tricuspid valve is normal in structure. Tricuspid valve regurgitation is mild . No evidence of tricuspid stenosis. Aortic Valve: The aortic valve is tricuspid. There is mild calcification of the aortic valve. There is mild thickening of the aortic valve. Aortic valve regurgitation is trivial. Aortic valve sclerosis is present, with no evidence of aortic valve stenosis. Pulmonic Valve: The pulmonic valve was grossly normal. Pulmonic valve regurgitation is mild. No evidence of pulmonic stenosis. Aorta: Aortic dilatation noted and the aortic root is normal in size and  structure. There is moderate dilatation of the ascending aorta, measuring 44 mm. Venous: The inferior vena cava is dilated in size with greater than 50% respiratory variability, suggesting right atrial pressure of 8 mmHg. IAS/Shunts: No atrial level shunt detected by color flow Doppler.  LEFT VENTRICLE PLAX 2D LVIDd:         5.60 cm LVIDs:         4.10 cm LV PW:         1.40 cm LV IVS:        1.20 cm LVOT diam:     2.55 cm LV SV:         81 LV SV Index:   36 LVOT Area:     5.11 cm  LV Volumes (MOD) LV vol d, MOD A2C: 152.0 ml LV vol d, MOD A4C: 160.5 ml LV vol s, MOD A2C: 118.5 ml LV vol s, MOD A4C: 97.3 ml LV SV MOD A2C:     33.5 ml LV SV MOD A4C:     160.5 ml LV SV MOD BP:      48.0 ml RIGHT VENTRICLE             IVC RV S prime:     13.20 cm/s  IVC diam: 2.60 cm TAPSE (M-mode): 1.7 cm LEFT ATRIUM              Index        RIGHT ATRIUM           Index LA diam:        4.30 cm  1.89 cm/m   RA Area:     29.60 cm LA Vol (A2C):   159.0 ml 69.85 ml/m  RA Volume:   98.20 ml  43.14 ml/m LA Vol (A4C):   103.0 ml 45.25 ml/m LA Biplane Vol: 129.0 ml 56.67 ml/m  AORTIC VALVE  PULMONIC VALVE LVOT Vmax:   86.40 cm/s  PR End Diast Vel: 1.33 msec LVOT Vmean:  51.400 cm/s LVOT VTI:    0.159 m  AORTA Ao Root diam: 3.90 cm Ao Asc diam:  4.40 cm MITRAL VALVE MV Area (PHT): 4.49 cm     SHUNTS MV Decel Time: 169 msec     Systemic VTI:  0.16 m MV E velocity: 105.00 cm/s  Systemic Diam: 2.55 cm Lennie Odor MD Electronically signed by Lennie Odor MD Signature Date/Time: 04/29/2023/3:50:35 PM    Final    CT Head Wo Contrast  Result Date: 04/29/2023 CLINICAL DATA:  Chest pain. Personal history of intracranial hemorrhage. Need to start heparin. EXAM: CT HEAD WITHOUT CONTRAST TECHNIQUE: Contiguous axial images were obtained from the base of the skull through the vertex without intravenous contrast. RADIATION DOSE REDUCTION: This exam was performed according to the departmental dose-optimization program which includes  automated exposure control, adjustment of the mA and/or kV according to patient size and/or use of iterative reconstruction technique. COMPARISON:  CT head without contrast 12/11/2016 FINDINGS: Brain: Mild atrophy and white matter changes are stable. The ventricles are of normal size. No significant extraaxial fluid collection is present. The brainstem and cerebellum are within normal limits. Craniotomy noted. No residual or recurrent intracranial hemorrhage is present. Vascular: No hyperdense vessel or unexpected calcification. Skull: Right frontal parietal craniotomy noted. Calvarium is otherwise intact. No significant extracranial soft tissue lesion is present. Sinuses/Orbits: The paranasal sinuses and mastoid air cells are clear. Bilateral lens replacements are noted. Globes and orbits are otherwise unremarkable. IMPRESSION: 1. No acute intracranial abnormality or significant interval change. 2. Stable atrophy and white matter disease. This likely reflects the sequela of chronic microvascular ischemia. 3. Right frontal parietal craniotomy without residual or recurrent intracranial hemorrhage. These results were called by telephone at the time of interpretation on 04/29/2023 at 10:48 am to provider Vonita Moss , who verbally acknowledged these results. Electronically Signed   By: Marin Roberts M.D.   On: 04/29/2023 11:09    Cardiac Studies   2D echo (04/29/2023)  IMPRESSIONS     1. Mid to distal deptum, apical anterior segment, and apex are akinetic.  No LV thrombus on contrast imaging. Other segments are hypokinetic. Left  ventricular ejection fraction, by estimation, is 25 to 30%. The left  ventricle has severely decreased  function. The left ventricle demonstrates regional wall motion  abnormalities (see scoring diagram/findings for description). The left  ventricular internal cavity size was mildly dilated. There is mild left  ventricular hypertrophy. Left ventricular  diastolic  parameters are indeterminate.   2. Right ventricular systolic function is normal. The right ventricular  size is moderately enlarged. Tricuspid regurgitation signal is inadequate  for assessing PA pressure.   3. Left atrial size was moderately dilated.   4. Right atrial size was severely dilated.   5. The mitral valve is normal in structure. Mild mitral valve  regurgitation. No evidence of mitral stenosis.   6. The aortic valve is tricuspid. There is mild calcification of the  aortic valve. There is mild thickening of the aortic valve. Aortic valve  regurgitation is trivial. Aortic valve sclerosis is present, with no  evidence of aortic valve stenosis.   7. Aortic dilatation noted. There is moderate dilatation of the ascending  aorta, measuring 44 mm.   8. The inferior vena cava is dilated in size with >50% respiratory  variability, suggesting right atrial pressure of 8 mmHg.   Patient Profile  Andrew [name] is a 82 y.o. 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 Lowell Guitar.   Assessment & Plan    1: Non-STEMI-troponins increased to 7500.  Currently pain-free on IV heparin.  Plan diagnostic coronary angiography tomorrow.  I have reviewed the risks, indications, and alternatives to cardiac catheterization, possible angioplasty, and stenting with the patient. Risks include but are not limited to bleeding, infection, vascular injury, stroke, myocardial infection, arrhythmia, kidney injury, radiation-related injury in the case of prolonged fluoroscopy use, emergency cardiac surgery, and death. The patient understands the risks of serious  complication is 1-2 in 1000 with diagnostic cardiac cath and 1-2% or less with angioplasty/stenting.  2: CKD 4-creatinine is in the low 3 range.  He does have a left arm AV fistula in anticipation potential dialysis.  We talked about the possibility of radiocontrast nephropathy based on cath but have decided that the risk benefit ratio favors coronary angiography potential intervention.  3: Permanent A-fib-not on anticoagulation because history of prior intracranial bleed  4: Essential hypertension-well-controlled on current medications new problem  5: Hyperlipidemia-LDL of 102 on atorvastatin 80 mg.  Will continue to follow  6: LV dysfunction-EF 25 to 30%, new since his last echo performed 10/14/2016 at which time it was normal.  I suspect this is ischemically mediated.  Renal dysfunction precludes optimal GDMT.  Will wait to see the results of his cardiac catheterization.      For questions or updates, please contact [facility] Please consult [email or web address] for contact info under        Signed, Nanetta Batty, MD  05/01/2023, 10:50 AM

## 2023-05-01 NOTE — Progress Notes (Signed)
Rebleed from right radial site.  5 cc's of air reinserted into TR band, hemostasis achieved.  Site is soft and patient reports no pain.  Arm is elevated on pillow.  Judie Grieve, cath lab supervisor in to assess patient and site.  Venia Carbon RN updated on patient's status and will begin to deflate again after an hour.  Patient transferred to 6E.    Jaxn Chiquito B

## 2023-05-01 NOTE — Plan of Care (Signed)
PT has TR band post cath. No stents placed at this time. Plan for ct surgery to see 12/4 on going forward  RN spoke with pharmacist Ruben Im, and per Dr. Jacinto Halim, pt will need to be placed back on heparin 4 hrs post TR band removal. TR band still on due to post cath bleeding

## 2023-05-02 ENCOUNTER — Encounter (HOSPITAL_COMMUNITY): Payer: Self-pay | Admitting: Cardiology

## 2023-05-02 DIAGNOSIS — I214 Non-ST elevation (NSTEMI) myocardial infarction: Secondary | ICD-10-CM | POA: Diagnosis not present

## 2023-05-02 LAB — CBC
HCT: 33.1 % — ABNORMAL LOW (ref 39.0–52.0)
Hemoglobin: 11.1 g/dL — ABNORMAL LOW (ref 13.0–17.0)
MCH: 33.9 pg (ref 26.0–34.0)
MCHC: 33.5 g/dL (ref 30.0–36.0)
MCV: 101.2 fL — ABNORMAL HIGH (ref 80.0–100.0)
Platelets: 79 10*3/uL — ABNORMAL LOW (ref 150–400)
RBC: 3.27 MIL/uL — ABNORMAL LOW (ref 4.22–5.81)
RDW: 12.9 % (ref 11.5–15.5)
WBC: 2.9 10*3/uL — ABNORMAL LOW (ref 4.0–10.5)
nRBC: 0 % (ref 0.0–0.2)

## 2023-05-02 LAB — HEPARIN LEVEL (UNFRACTIONATED)
Heparin Unfractionated: 0.18 [IU]/mL — ABNORMAL LOW (ref 0.30–0.70)
Heparin Unfractionated: 0.57 [IU]/mL (ref 0.30–0.70)

## 2023-05-02 LAB — BASIC METABOLIC PANEL
Anion gap: 11 (ref 5–15)
BUN: 30 mg/dL — ABNORMAL HIGH (ref 8–23)
CO2: 19 mmol/L — ABNORMAL LOW (ref 22–32)
Calcium: 8.5 mg/dL — ABNORMAL LOW (ref 8.9–10.3)
Chloride: 111 mmol/L (ref 98–111)
Creatinine, Ser: 3.06 mg/dL — ABNORMAL HIGH (ref 0.61–1.24)
GFR, Estimated: 20 mL/min — ABNORMAL LOW (ref >60)
Glucose, Bld: 119 mg/dL — ABNORMAL HIGH (ref 70–99)
Potassium: 3.8 mmol/L (ref 3.5–5.1)
Sodium: 141 mmol/L (ref 135–145)

## 2023-05-02 MED ORDER — VENETOCLAX 100 MG PO TABS
300.0000 mg | ORAL_TABLET | Freq: Every day | ORAL | Status: DC
  Administered 2023-05-02 – 2023-05-05 (×4): 300 mg via ORAL
  Filled 2023-05-02 (×5): qty 3

## 2023-05-02 MED ORDER — VENETOCLAX 100 MG PO TABS
300.0000 mg | ORAL_TABLET | Freq: Every day | ORAL | Status: DC
  Filled 2023-05-02: qty 3

## 2023-05-02 NOTE — Plan of Care (Signed)
Pt is aox4, vss, on heparin at 17, plan for bypass on Wednesday of next week

## 2023-05-02 NOTE — Progress Notes (Addendum)
Patient Name: Andrew [name] Date of Encounter: 05/02/2023 [facility] Cardiologist: Dina Rich, MD   Interval Summary  .    No chest pain, family at the bedside.   Vital Signs .    Vitals:   05/01/23 1946 05/01/23 2341 05/02/23 0420 05/02/23 0845  BP: (!) 145/87 (!) 144/80 (!) 149/82 121/88  Pulse: 86 72 81 77  Resp: 17 15 17 16   Temp: 97.6 F (36.4 C) 97.7 F (36.5 C) 97.9 F (36.6 C) 97.6 F (36.4 C)  TempSrc: Oral Oral Oral Oral  SpO2: 97% 95% 97% 97%  Weight:      Height:        Intake/Output Summary (Last 24 hours) at 05/02/2023 0927 Last data filed at 05/02/2023 0851 Gross per 24 hour  Intake 1602.12 ml  Output 850 ml  Net 752.12 ml      05/01/2023    5:59 AM 04/30/2023    6:21 AM 04/29/2023    8:20 AM  Last 3 Weights  Weight (lbs) 225 lb 9.8 oz 226 lb 13.7 oz 223 lb  Weight (kg) 102.336 kg 102.9 kg 101.152 kg      Telemetry/ECG    Rate controlled atrial fib - Personally Reviewed  Physical Exam .   GEN: No acute distress.   Neck: No JVD Cardiac: Irreg Irreg, no murmurs, rubs, or gallops.  Respiratory: Clear to auscultation bilaterally. GI: Soft, nontender, non-distended  MS: No edema Skin: right radial cath site stable  Assessment & Plan .     82 y.o. 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 Lowell Guitar.   NSTEMI -- High-sensitivity troponin peaked at 7531.  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  Recommendations for TCTS evaluation for possible LIMA to LAD given his chronic thrombocytopenia.  Seen  by CT surgery, formal recommendations pending regarding surgical candidacy.  -- Continue IV heparin, aspirin, atorvastatin 80 mg daily, carvedilol 6.25 mg twice daily, hydralazine 25 3 times daily, Imdur 30 mg daily  HFrEF ICM -- Echo with LVEF of 25 to 30%, mid to distal septum, apical anterior and apex akinesis, no LV thrombus, moderately enlarged RV, severe right atrial enlargement, moderate left atrial enlargement -- GDMT: Continue carvedilol 6.25 mg twice daily, hydralazine 25 mg 3 times daily, Imdur 30 mg daily.  Unable to add additional therapy secondary to renal disease  CKD stage IV -- Follows with Dr. Arrie Aran as an outpatient -- Has a left arm AV fistula in preparation for future dialysis -- Creatinine stable post cath at 3.06  CLL Thrombocytopenia -- Follows with oncology as an outpatient, platelet count remains stable around 80,000 -- on venetoclax  Hypertension -- Controlled -- Continue carvedilol 6.25 mg twice daily, hydralazine 25 mg 3 times daily and Imdur 30 mg daily  Hyperlipidemia -- LDL 102 -- Continue atorvastatin 80 mg daily  Permanent atrial fibrillation -- Rates controlled, not on anticoagulation secondary to history of subdural hematoma  For questions or updates, please contact [facility] Please consult [email or web address] for contact info under        Signed, Laverda Page, NP    Agree with note by Laverda Page  NP-C  Patient admitted with non-STEMI.  Catheter revealed high-grade 95% near ostial LAD stenosis with posttraumatic dilatation/aneurysmal dilatation making PC revascularization not optimal.  His other disease was evaluated by DFR was negative.  Does have severe LV dysfunction new since his last echo.  I believe he would benefit from CABG with a LIMA to his LAD.  Other problems include treated hypertension, hyperlipidemia and permanent A-fib not on oral anticoagulation because of prior subdural hematoma.  He does have CKD 4 and  minimal contrast was used without a change in his serum creatinine which is 3.06 this morning.  We will get T CTS to consult.  Runell Gess, M.D., FACP, [facility], [facility] [address] [place], [place]  [address]  [phone] 05/02/2023 12:35 PM

## 2023-05-02 NOTE — Consult Note (Addendum)
Pharmacy Consult Note - Anticoagulation  Pharmacy Consult for heparin Indication: chest pain/ACS  PATIENT MEASUREMENTS: Height: 6\' 2"  (188 cm) Weight: 102.3 kg (225 lb 9.8 oz) IBW/kg (Calculated) : 82.2 HEPARIN DW (KG): 101.2  VITAL SIGNS: Temp: 97.9 F (36.6 C) (12/04 0420) Temp Source: Oral (12/04 0420) BP: 149/82 (12/04 0420) Pulse Rate: 81 (12/04 0420)  Recent Labs    04/29/23 1111 04/29/23 1432 04/30/23 0342 04/30/23 1048 05/02/23 0412  HGB  --   --  11.6*   < > 11.1*  HCT  --   --  33.6*   < > 33.1*  PLT  --   --  83*   < > 79*  APTT 25  --   --   --   --   LABPROT 14.0  --   --   --   --   INR 1.1  --   --   --   --   HEPARINUNFRC  --    < > 0.30   < > 0.18*  CREATININE  --   --  3.38*   < > 3.06*  TROPONINIHS  --    < > 6,501*  --   --    < > = values in this interval not displayed.    Estimated Creatinine Clearance: 23.3 mL/min (A) (by C-G formula based on SCr of 3.06 mg/dL (H)).  PAST MEDICAL HISTORY: Past Medical History:  Diagnosis Date   Arthritis    Ascending aortic aneurysm (HCC)    CHF (congestive heart failure) (HCC)    cardiac catheterization in 2007 had revealed LVEF of 35% with no significant coronary disease   CKD (chronic kidney disease), stage IV (HCC)    followed by [facility]   CLL (chronic lymphocytic leukemia) (HCC)    Complication of anesthesia    "wild when I woke up "   History of kidney stones    passed    Hypertension    NICM (nonischemic cardiomyopathy) (HCC)    Cardiac cath 2007, EF 35%, minimal CAD. b. EF normal 2018.   PAF (paroxysmal atrial fibrillation) (HCC)    Pinched nerve in neck    Pneumonia    SDH (subdural hematoma) (HCC)    Thrombocytopenia (HCC)     ASSESSMENT: 82 y.o. male with PMH including ICH, chronic lymphocytic leukemia and Afib s/p ablation (not currently on anticoagulation) is presenting with chest pain and left shoulder pain. cTn are trending up and ECG exhibits nonspecific T wave  abnormalities. Head CT did not show any evidence of ICH. Patient is not on chronic anticoagulation per chart review. Pharmacy has been consulted to initiate and manage heparin intravenous infusion.  Heparin level 0.18 ~5.5h since restarting heparin gtt is subtherapeutic on 1450 units/hr.  Hgb 11.1, pltc 79.  No issues noted.   S/p LHC with single-vessel prox LAD disease, TCTS to evaluate for possible CABG.  Goal(s) of therapy: Heparin level 0.3 - 0.7 units/mL Monitor platelets by anticoagulation protocol: Yes   PLAN: Increase heparin infusion to 1700 units/hr 8 hour heparin level Daily CBC and heparin level while on heparin Monitor s/s bleeding  F/u after cath  Trixie Rude, PharmD Clinical Pharmacist 05/02/2023  7:02 AM

## 2023-05-02 NOTE — Progress Notes (Signed)
PROGRESS NOTE  Andrew [name] XLK:[id number] DOB: 1940/11/03 DOA: 04/29/2023 PCP: Benita Stabile, MD  HPI/Recap of past 24 hours: Andrew [name] is a 82 y.o. male with a PMH significant for stage IV CLL, CKD stage IV, normocytic anemia, permanent atrial fibrillation not on anticoagulation due to prior subdural hematoma and chronic thrombocytopenia, HFpEF, ascending aortic aneurysm, and hypertension, presented from home to the ED on 04/29/2023 with chest discomfort x 2 days and radiated to the left shoulder. Found to have NSTEMI and started on heparin gtt and transferred from [facility] to [facility]. Patient was admitted to medicine service for further workup and management of NSTEMI.    Today, pt denies any new complaints, denies any chest pain, shortness of breath, abdominal pain, nausea/vomiting, fever/chills.    Assessment/Plan: Principal Problem:   NSTEMI (non-ST elevated myocardial infarction) (HCC) Active Problems:   CLL (chronic lymphocytic leukemia) (HCC)   Subdural hematoma (HCC)   A-fib (HCC)   CKD (chronic kidney disease) stage 5, GFR less than 15 ml/min (HCC)   Iron deficiency anemia   CKD (chronic kidney disease), stage IV (HCC)   HFrEF (heart failure with reduced ejection fraction) (HCC)   Atrial fibrillation, chronic (HCC)   NSTEMI Ischemic cardiomyopathy Chest pain free currently Troponin peaked at 7531 2D echocardiogram with reduced EF of 25 to 30%, regional wall motion abnormality Started on heparin drip after CT head without acute intracranial abnormality Cardiology consulted, s/p cardiac cath showed single-vessel proximal LAD, recommend CABG Cardiothoracic surgery consulted, plan for CABG Continue IV heparin, aspirin, Lipitor, Coreg, Imdur, hydralazine Continue telemetry monitoring  CKD stage IV Creatinine stable Status post AV fistula, but not currently started on HD Continue bicarb Follows with nephrology, Dr. Arrie Aran outpatient Daily BMP  HTN Continue coreg,  imdur, hydralazine   Permanent atrial fibrillation Not on anticoagulation due to prior subdural hematoma Continue Coreg 6.25 mg twice daily   Prior subdural hematoma Status post craniotomy with evacuation 09/2016 Neurological baseline currently without focal changes CT head with no acute findings Monitor neurological status carefully while on a heparin drip  CLL/chronic thrombocytopenia Pancytopenia Followed by Dr. Ellin Saba Continue venetoclax   Chronic normocytic anemia Continue iron tablets 3 times weekly Daily CBC    Estimated body mass index is 28.97 kg/m as calculated from the following:   Height as of this encounter: 6\' 2"  (1.88 m).   Weight as of this encounter: 102.3 kg.     Code Status: Full  Family Communication: Wife at bedside  Disposition Plan: Status is: Inpatient Remains inpatient appropriate because: level of care      Consultants: Cardiology Cardiothoracic surgery  Procedures: LHC  Antimicrobials: None  DVT prophylaxis: Heparin drip   Objective: Vitals:   05/02/23 0420 05/02/23 0845 05/02/23 1308 05/02/23 1711  BP: (!) 149/82 121/88 110/63 (!) 142/84  Pulse: 81 77 79 69  Resp: 17 16 18 18   Temp: 97.9 F (36.6 C) 97.6 F (36.4 C) 97.6 F (36.4 C) 97.7 F (36.5 C)  TempSrc: Oral Oral Oral Oral  SpO2: 97% 97% 94% 96%  Weight:      Height:        Intake/Output Summary (Last 24 hours) at 05/02/2023 1834 Last data filed at 05/02/2023 1746 Gross per 24 hour  Intake 2322.12 ml  Output 850 ml  Net 1472.12 ml   Filed Weights   04/29/23 0820 04/30/23 0621 05/01/23 0559  Weight: 101.2 kg 102.9 kg 102.3 kg    Exam: General: NAD  Cardiovascular: S1, S2  present Respiratory: CTAB Abdomen: Soft, nontender, nondistended, bowel sounds present Musculoskeletal: No bilateral pedal edema noted Skin: Normal Psychiatry: Normal mood     Data Reviewed: CBC: Recent Labs  Lab 04/29/23 0904 04/30/23 0342 05/01/23 0257 05/02/23 0412   WBC 3.1* 3.0* 3.2* 2.9*  HGB 12.9* 11.6* 11.1* 11.1*  HCT 37.5* 33.6* 32.9* 33.1*  MCV 101.9* 100.3* 100.0 101.2*  PLT 100* 83* 82* 79*   Basic Metabolic Panel: Recent Labs  Lab 04/29/23 0904 04/30/23 0342 05/01/23 0257 05/02/23 0412  NA 140 139 142 141  K 4.1 3.8 4.0 3.8  CL 107 110 113* 111  CO2 23 22 21* 19*  GLUCOSE 141* 106* 115* 119*  BUN 36* 37* 32* 30*  CREATININE 3.11* 3.38* 3.38* 3.06*  CALCIUM 9.1 8.9 8.6* 8.5*  MG  --  2.2  --   --    GFR: Estimated Creatinine Clearance: 23.3 mL/min (A) (by C-G formula based on SCr of 3.06 mg/dL (H)). Liver Function Tests: No results for input(s): "AST", "ALT", "ALKPHOS", "BILITOT", "PROT", "ALBUMIN" in the last 168 hours. No results for input(s): "LIPASE", "AMYLASE" in the last 168 hours. No results for input(s): "AMMONIA" in the last 168 hours. Coagulation Profile: Recent Labs  Lab 04/29/23 1111  INR 1.1   Cardiac Enzymes: No results for input(s): "CKTOTAL", "CKMB", "CKMBINDEX", "TROPONINI" in the last 168 hours. BNP (last 3 results) No results for input(s): "PROBNP" in the last 8760 hours. HbA1C: No results for input(s): "HGBA1C" in the last 72 hours. CBG: No results for input(s): "GLUCAP" in the last 168 hours. Lipid Profile: Recent Labs    04/30/23 0342  CHOL 148  HDL 27*  LDLCALC 102*  TRIG 95  CHOLHDL 5.5   Thyroid Function Tests: Recent Labs    04/30/23 0342  TSH 0.552   Anemia Panel: No results for input(s): "VITAMINB12", "FOLATE", "FERRITIN", "TIBC", "IRON", "RETICCTPCT" in the last 72 hours. Urine analysis:    Component Value Date/Time   COLORURINE STRAW (A) 12/17/2017 1725   APPEARANCEUR CLEAR 12/17/2017 1725   LABSPEC 1.010 12/17/2017 1725   PHURINE 6.0 12/17/2017 1725   GLUCOSEU NEGATIVE 12/17/2017 1725   HGBUR MODERATE (A) 12/17/2017 1725   BILIRUBINUR NEGATIVE 12/17/2017 1725   KETONESUR NEGATIVE 12/17/2017 1725   PROTEINUR 30 (A) 12/17/2017 1725   NITRITE NEGATIVE 12/17/2017 1725    LEUKOCYTESUR TRACE (A) 12/17/2017 1725   Sepsis Labs: @LABRCNTIP (procalcitonin:4,lacticidven:4)  )No results found for this or any previous visit (from the past 240 hour(s)).    Studies: No results found.  Scheduled Meds:  aspirin EC  81 mg Oral Daily   atorvastatin  80 mg Oral Daily   carvedilol  6.25 mg Oral BID   cholecalciferol  1,000 Units Oral Daily   cyanocobalamin  1,000 mcg Oral Daily   ferrous sulfate  325 mg Oral Once per day on Monday Wednesday Friday   hydrALAZINE  25 mg Oral Q8H   isosorbide mononitrate  30 mg Oral Daily   levothyroxine  100 mcg Oral Daily   multivitamin with minerals  1 tablet Oral Daily   pantoprazole  40 mg Oral Daily   sodium bicarbonate  650 mg Oral TID   sodium chloride flush  3 mL Intravenous Q12H   tamsulosin  0.4 mg Oral QPC supper   venetoclax  300 mg Oral Q breakfast    Continuous Infusions:  sodium chloride     heparin 1,700 Units/hr (05/02/23 0713)     LOS: 3 days  Briant Cedar, MD [facility]  If 7PM-7AM, please contact night-coverage [email or web address] 05/02/2023, 6:34 PM

## 2023-05-02 NOTE — Care Management Important Message (Signed)
Important Message  Patient Details  Name: Andrew [name] MRN: [id number] Date of Birth: 26-Dec-1940   Important Message Given:  Yes - Medicare IM     Sherilyn Banker 05/02/2023, 2:22 PM

## 2023-05-02 NOTE — TOC Initial Note (Signed)
Transition of Care [facility]) - Initial/Assessment Note    Patient Details  Name: Andrew [name] MRN: [id number] Date of Birth: April 23, 1941  [facility] CM/SW Contact:    Lawerance Sabal, RN Phone Number: 05/02/2023, 10:50 AM  Clinical Narrative:                  Patient from home with spouse, admitted w CP, NSTEMI.  Recommendations for TCTS evaluation for possible LIMA to LAD given his chronic thrombocytopenia.  Seen by CT surgery, formal recommendations pending regarding surgical candidacy.      Expected Discharge Plan: Home/Self Care Barriers to Discharge: Continued Medical Work up   Patient Goals and CMS Choice            Expected Discharge Plan and Services   Discharge Planning Services: CM Consult   Living arrangements for the past 2 months: Single Family Home                                      Prior Living Arrangements/Services Living arrangements for the past 2 months: Single Family Home Lives with:: Spouse                   Activities of Daily Living   ADL Screening (condition at time of admission) Independently performs ADLs?: Yes (appropriate for developmental age) Is the patient deaf or have difficulty hearing?: Yes Does the patient have difficulty seeing, even when wearing glasses/contacts?: No Does the patient have difficulty concentrating, remembering, or making decisions?: No  Permission Sought/Granted                  Emotional Assessment              Admission diagnosis:  NSTEMI (non-ST elevated myocardial infarction) (HCC) [I21.4] Chest pain, unspecified type [R07.9] Patient Active Problem List   Diagnosis Date Noted   CKD (chronic kidney disease), stage IV (HCC) 04/30/2023   HFrEF (heart failure with reduced ejection fraction) (HCC) 04/30/2023   Atrial fibrillation, chronic (HCC) 04/30/2023   NSTEMI (non-ST elevated myocardial infarction) (HCC) 04/29/2023   Barrett's esophagus 09/30/2020   Adenomatous  colon polyp 09/30/2020   Chronic GERD 09/30/2020   Iron deficiency anemia 07/01/2020   CKD (chronic kidney disease) stage 5, GFR less than 15 ml/min (HCC)    At high risk of tumor lysis syndrome    Macrocytic anemia    Goals of care, counseling/discussion 11/13/2019   Abnormal gastrointestinal PET scan 07/17/2018   AKI (acute kidney injury) (HCC) 12/17/2017   Hypertension    Subdural hematoma (HCC) 10/12/2016   A-fib (HCC) 10/12/2016   Hypoxemia 05/13/2016   Preventative health care 03/06/2016   CLL (chronic lymphocytic leukemia) (HCC) 03/09/2015   PCP:  Benita Stabile, MD Pharmacy:   [facility] - [place], [place] - [address] [place] [address] Phone: [phone] Fax: [phone]  [facility] [address] [address] Phone: [phone] Fax: [phone]  [id number] - [place], [place] - [address] [place] [address] Phone: [phone] Fax: [phone]  [facility] [address] [place] [address] Phone: [phone] Fax: [phone]     Social Determinants of Health (SDOH) Social History: SDOH Screenings   Food Insecurity: No Food Insecurity (04/29/2023)  Housing: Low Risk  (  04/29/2023)  Transportation Needs: No Transportation Needs (04/29/2023)  Utilities: Not At Risk (04/29/2023)  Alcohol Screen: Low Risk  (08/30/2020)  Depression (PHQ2-9): Low Risk  (08/30/2020)  Financial Resource Strain: Low Risk  (08/30/2020)  Physical Activity: Inactive (08/30/2020)  Social Connections: Moderately Integrated (08/30/2020)  Stress: No Stress Concern Present (08/30/2020)  Tobacco Use: Medium Risk (04/29/2023)   SDOH Interventions:     Readmission Risk Interventions     No data to display

## 2023-05-03 ENCOUNTER — Inpatient Hospital Stay (HOSPITAL_COMMUNITY): Payer: Medicare HMO

## 2023-05-03 DIAGNOSIS — Z01810 Encounter for preprocedural cardiovascular examination: Secondary | ICD-10-CM

## 2023-05-03 DIAGNOSIS — I214 Non-ST elevation (NSTEMI) myocardial infarction: Secondary | ICD-10-CM | POA: Diagnosis not present

## 2023-05-03 LAB — BASIC METABOLIC PANEL
Anion gap: 8 (ref 5–15)
BUN: 31 mg/dL — ABNORMAL HIGH (ref 8–23)
CO2: 21 mmol/L — ABNORMAL LOW (ref 22–32)
Calcium: 8.7 mg/dL — ABNORMAL LOW (ref 8.9–10.3)
Chloride: 110 mmol/L (ref 98–111)
Creatinine, Ser: 3.27 mg/dL — ABNORMAL HIGH (ref 0.61–1.24)
GFR, Estimated: 18 mL/min — ABNORMAL LOW (ref >60)
Glucose, Bld: 103 mg/dL — ABNORMAL HIGH (ref 70–99)
Potassium: 4.2 mmol/L (ref 3.5–5.1)
Sodium: 139 mmol/L (ref 135–145)

## 2023-05-03 LAB — CBC
HCT: 33 % — ABNORMAL LOW (ref 39.0–52.0)
Hemoglobin: 11 g/dL — ABNORMAL LOW (ref 13.0–17.0)
MCH: 33.6 pg (ref 26.0–34.0)
MCHC: 33.3 g/dL (ref 30.0–36.0)
MCV: 100.9 fL — ABNORMAL HIGH (ref 80.0–100.0)
Platelets: 76 10*3/uL — ABNORMAL LOW (ref 150–400)
RBC: 3.27 MIL/uL — ABNORMAL LOW (ref 4.22–5.81)
RDW: 12.8 % (ref 11.5–15.5)
WBC: 2.8 10*3/uL — ABNORMAL LOW (ref 4.0–10.5)
nRBC: 0 % (ref 0.0–0.2)

## 2023-05-03 LAB — LIPOPROTEIN A (LPA): Lipoprotein (a): 37.1 nmol/L — ABNORMAL HIGH (ref <75.0)

## 2023-05-03 LAB — HEPARIN LEVEL (UNFRACTIONATED): Heparin Unfractionated: 0.41 [IU]/mL (ref 0.30–0.70)

## 2023-05-03 NOTE — Progress Notes (Addendum)
Patient Name: Andrew [name] Date of Encounter: 05/03/2023 [facility] Cardiologist: Dina Rich, MD   Interval Summary  .    Sitting up in the chair, family at bedside.   Vital Signs .    Vitals:   05/02/23 1711 05/02/23 2022 05/03/23 0552 05/03/23 0846  BP: (!) 142/84 135/85 127/80 132/84  Pulse: 69 65 82 84  Resp: 18 18 20 18   Temp: 97.7 F (36.5 C) (!) 97.2 F (36.2 C) 98.4 F (36.9 C) 97.6 F (36.4 C)  TempSrc: Oral Oral Oral Oral  SpO2: 96% 92% 92% 96%  Weight:      Height:        Intake/Output Summary (Last 24 hours) at 05/03/2023 0954 Last data filed at 05/03/2023 0935 Gross per 24 hour  Intake 1762.31 ml  Output --  Net 1762.31 ml      05/01/2023    5:59 AM 04/30/2023    6:21 AM 04/29/2023    8:20 AM  Last 3 Weights  Weight (lbs) 225 lb 9.8 oz 226 lb 13.7 oz 223 lb  Weight (kg) 102.336 kg 102.9 kg 101.152 kg      Telemetry/ECG    Atrial fibrillation, rate controlled - Personally Reviewed  Physical Exam .   GEN: No acute distress.   Neck: No JVD Cardiac: Irreg Irreg, no murmurs, rubs, or gallops.  Respiratory: Clear to auscultation bilaterally. GI: Soft, nontender, non-distended  MS: No edema  Assessment & Plan .     82 y.o. 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 Lowell Guitar.    NSTEMI -- High-sensitivity troponin peaked at 7531.  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  Recommendations for TCTS evaluation for possible LIMA to LAD given his chronic thrombocytopenia.  Seen by CT surgery, and felt to  be appropriate candidate for CABG with LIMA-LAD. -- Continue IV heparin, aspirin, atorvastatin 80 mg daily, carvedilol 6.25 mg twice daily, hydralazine 25 3 times daily, Imdur 30 mg daily   HFrEF ICM -- Echo with LVEF of 25 to 30%, mid to distal septum, apical anterior and apex akinesis, no LV thrombus, moderately enlarged RV, severe right atrial enlargement, moderate left atrial enlargement -- GDMT: Continue carvedilol 6.25 mg twice daily, hydralazine 25 mg 3 times daily, Imdur 30 mg daily.  Unable to add additional therapy secondary to renal disease   CKD stage IV -- Follows with Dr. Arrie Aran as an outpatient -- Has a left arm AV fistula in preparation for future dialysis -- Creatinine stable post cath 3.06>>3.27   CLL Thrombocytopenia -- Follows with oncology as an outpatient, platelet count remains stable around 75-80,000 -- on venetoclax   Hypertension -- Controlled -- Continue carvedilol 6.25 mg twice daily, hydralazine 25 mg 3 times daily and Imdur 30 mg daily   Hyperlipidemia -- LDL 102 -- Continue atorvastatin 80 mg daily   Permanent atrial fibrillation -- Rates controlled, not on anticoagulation secondary to history of subdural hematoma  For questions or updates, please contact [facility] Please consult [email or web address] for contact info under        Signed, Laverda Page, NP    Agree with note by  Laverda Page NP-C  Patient admitted with non-STEMI not suitable for percutaneous intervention.  Does have LV dysfunction and all other problems as outlined.  He has permanent A-fib not on anticoagulation because of prior subdural hematoma.  He is asymptomatic, hemodynamically stable and awaiting CABG on 12/11 with Dr. Cliffton Asters.  Runell Gess, M.D., FACP, [facility], [facility] [address] [place], [place]  [address]  [phone] 05/03/2023 10:13 AM

## 2023-05-03 NOTE — Progress Notes (Signed)
Pt undergoing echo, will complete pre-op education later.

## 2023-05-03 NOTE — Plan of Care (Signed)

## 2023-05-03 NOTE — Progress Notes (Addendum)
PROGRESS NOTE  Andrew [name]:[id number] DOB: Feb 02, 1941 DOA: 04/29/2023 PCP: Benita Stabile, MD  HPI/Recap of past 24 hours: Andrew [name] is a 82 y.o. male with a PMH significant for stage IV CLL, CKD stage IV, normocytic anemia, permanent atrial fibrillation not on anticoagulation due to prior subdural hematoma and chronic thrombocytopenia, HFpEF, ascending aortic aneurysm, and hypertension, presented from home to the ED on 04/29/2023 with chest discomfort x 2 days and radiated to the left shoulder. Found to have NSTEMI and started on heparin gtt and transferred from [facility] to [facility]. Patient was admitted to medicine service for further workup and management of NSTEMI.    Today, patient denies any new complaints.    Assessment/Plan: Principal Problem:   NSTEMI (non-ST elevated myocardial infarction) (HCC) Active Problems:   CLL (chronic lymphocytic leukemia) (HCC)   Subdural hematoma (HCC)   A-fib (HCC)   CKD (chronic kidney disease) stage 5, GFR less than 15 ml/min (HCC)   Iron deficiency anemia   CKD (chronic kidney disease), stage IV (HCC)   HFrEF (heart failure with reduced ejection fraction) (HCC)   Atrial fibrillation, chronic (HCC)   NSTEMI Ischemic cardiomyopathy Chest pain free currently Troponin peaked at 7531 2D echocardiogram with reduced EF of 25 to 30%, regional wall motion abnormality Started on heparin drip after CT head without acute intracranial abnormality Cardiology consulted, s/p cardiac cath showed single-vessel proximal LAD, recommend CABG Cardiothoracic surgery consulted, plan for CABG 12/11 Continue IV heparin, aspirin, Lipitor, Coreg, Imdur, hydralazine Continue telemetry monitoring  CKD stage IV Creatinine stable Status post AV fistula, but not currently started on HD Continue bicarb Follows with nephrology, Dr. Arrie Aran outpatient Daily BMP  HTN Continue coreg, imdur, hydralazine   Permanent atrial fibrillation Not on anticoagulation due  to prior subdural hematoma Continue Coreg 6.25 mg twice daily   Prior subdural hematoma Status post craniotomy with evacuation 09/2016 Neurological baseline currently without focal changes CT head with no acute findings Monitor neurological status carefully while on a heparin drip  CLL/chronic thrombocytopenia Pancytopenia Followed by Dr. Ellin Saba Continue venetoclax   Chronic normocytic anemia Continue iron tablets 3 times weekly Daily CBC    Estimated body mass index is 28.97 kg/m as calculated from the following:   Height as of this encounter: 6\' 2"  (1.88 m).   Weight as of this encounter: 102.3 kg.     Code Status: Full  Family Communication: Wife at bedside  Disposition Plan: Status is: Inpatient Remains inpatient appropriate because: level of care      Consultants: Cardiology Cardiothoracic surgery  Procedures: LHC  Antimicrobials: None  DVT prophylaxis: Heparin drip   Objective: Vitals:   05/02/23 2022 05/03/23 0552 05/03/23 0846 05/03/23 1623  BP: 135/85 127/80 132/84 126/72  Pulse: 65 82 84 61  Resp: 18 20 18 18   Temp: (!) 97.2 F (36.2 C) 98.4 F (36.9 C) 97.6 F (36.4 C) 97.7 F (36.5 C)  TempSrc: Oral Oral Oral Oral  SpO2: 92% 92% 96% 96%  Weight:      Height:        Intake/Output Summary (Last 24 hours) at 05/03/2023 1840 Last data filed at 05/03/2023 1731 Gross per 24 hour  Intake 1927.89 ml  Output --  Net 1927.89 ml   Filed Weights   04/29/23 0820 04/30/23 0621 05/01/23 0559  Weight: 101.2 kg 102.9 kg 102.3 kg    Exam: General: NAD  Cardiovascular: S1, S2 present Respiratory: CTAB Abdomen: Soft, nontender, nondistended, bowel sounds present Musculoskeletal: No  bilateral pedal edema noted Skin: Normal Psychiatry: Normal mood     Data Reviewed: CBC: Recent Labs  Lab 04/29/23 0904 04/30/23 0342 05/01/23 0257 05/02/23 0412 05/03/23 0358  WBC 3.1* 3.0* 3.2* 2.9* 2.8*  HGB 12.9* 11.6* 11.1* 11.1* 11.0*  HCT  37.5* 33.6* 32.9* 33.1* 33.0*  MCV 101.9* 100.3* 100.0 101.2* 100.9*  PLT 100* 83* 82* 79* 76*   Basic Metabolic Panel: Recent Labs  Lab 04/29/23 0904 04/30/23 0342 05/01/23 0257 05/02/23 0412 05/03/23 0358  NA 140 139 142 141 139  K 4.1 3.8 4.0 3.8 4.2  CL 107 110 113* 111 110  CO2 23 22 21* 19* 21*  GLUCOSE 141* 106* 115* 119* 103*  BUN 36* 37* 32* 30* 31*  CREATININE 3.11* 3.38* 3.38* 3.06* 3.27*  CALCIUM 9.1 8.9 8.6* 8.5* 8.7*  MG  --  2.2  --   --   --    GFR: Estimated Creatinine Clearance: 21.8 mL/min (A) (by C-G formula based on SCr of 3.27 mg/dL (H)). Liver Function Tests: No results for input(s): "AST", "ALT", "ALKPHOS", "BILITOT", "PROT", "ALBUMIN" in the last 168 hours. No results for input(s): "LIPASE", "AMYLASE" in the last 168 hours. No results for input(s): "AMMONIA" in the last 168 hours. Coagulation Profile: Recent Labs  Lab 04/29/23 1111  INR 1.1   Cardiac Enzymes: No results for input(s): "CKTOTAL", "CKMB", "CKMBINDEX", "TROPONINI" in the last 168 hours. BNP (last 3 results) No results for input(s): "PROBNP" in the last 8760 hours. HbA1C: No results for input(s): "HGBA1C" in the last 72 hours. CBG: No results for input(s): "GLUCAP" in the last 168 hours. Lipid Profile: No results for input(s): "CHOL", "HDL", "LDLCALC", "TRIG", "CHOLHDL", "LDLDIRECT" in the last 72 hours.  Thyroid Function Tests: No results for input(s): "TSH", "T4TOTAL", "FREET4", "T3FREE", "THYROIDAB" in the last 72 hours.  Anemia Panel: No results for input(s): "VITAMINB12", "FOLATE", "FERRITIN", "TIBC", "IRON", "RETICCTPCT" in the last 72 hours. Urine analysis:    Component Value Date/Time   COLORURINE STRAW (A) 12/17/2017 1725   APPEARANCEUR CLEAR 12/17/2017 1725   LABSPEC 1.010 12/17/2017 1725   PHURINE 6.0 12/17/2017 1725   GLUCOSEU NEGATIVE 12/17/2017 1725   HGBUR MODERATE (A) 12/17/2017 1725   BILIRUBINUR NEGATIVE 12/17/2017 1725   KETONESUR NEGATIVE 12/17/2017  1725   PROTEINUR 30 (A) 12/17/2017 1725   NITRITE NEGATIVE 12/17/2017 1725   LEUKOCYTESUR TRACE (A) 12/17/2017 1725   Sepsis Labs: @LABRCNTIP (procalcitonin:4,lacticidven:4)  )No results found for this or any previous visit (from the past 240 hour(s)).    Studies: VAS US CAROTID  Result Date: 05/03/2023 Carotid Arterial Duplex Study Patient Name:  HELADIO SETTLEMIRE  Date of Exam:   05/03/2023 Medical Rec #: [id number]       Accession #:    [id number] Date of Birth: 08-Dec-1940      Patient Gender: M Patient Age:   61 years Exam Location:  [facility] Procedure:      VAS US CAROTID Referring Phys: HARRELL LIGHTFOOT --------------------------------------------------------------------------------  Indications:       CABG. Risk Factors:      Hypertension, coronary artery disease. Other Factors:     Leukemia, anemia, subdural hematoma, A-fib. Comparison Study:  No prior exam. Performing Technologist: Fernande Bras  Examination Guidelines: A complete evaluation includes B-mode imaging, spectral Doppler, color Doppler, and power Doppler as needed of all accessible portions of each vessel. Bilateral testing is considered an integral part of a complete examination. Limited examinations for reoccurring indications may be performed as noted.  Right Carotid Findings: +----------+--------+-------+--------+--------------------------------+--------+           PSV cm/sEDV    StenosisPlaque Description              Comments                   cm/s                                                    +----------+--------+-------+--------+--------------------------------+--------+ CCA Prox  75      13                                                      +----------+--------+-------+--------+--------------------------------+--------+ CCA Distal74      16                                                      +----------+--------+-------+--------+--------------------------------+--------+ ICA  Prox  50      11             heterogenous, smooth and                                                  calcific                                 +----------+--------+-------+--------+--------------------------------+--------+ ICA Mid   72      25                                             tortuous +----------+--------+-------+--------+--------------------------------+--------+ ICA Distal51      18                                                      +----------+--------+-------+--------+--------------------------------+--------+ ECA       138     19             calcific                                 +----------+--------+-------+--------+--------------------------------+--------+ +----------+--------+-------+--------+-------------------+           PSV cm/sEDV cmsDescribeArm Pressure (mmHG) +----------+--------+-------+--------+-------------------+ [id number]      11                                 +----------+--------+-------+--------+-------------------+ +---------+--------+--+--------+--+ VertebralPSV cm/s26EDV cm/s10 +---------+--------+--+--------+--+  Left Carotid Findings: +----------+--------+--------+--------+------------------+--------+           PSV cm/sEDV cm/sStenosisPlaque DescriptionComments +----------+--------+--------+--------+------------------+--------+  CCA Prox  82      16                                         +----------+--------+--------+--------+------------------+--------+ CCA Distal89      16                                         +----------+--------+--------+--------+------------------+--------+ ICA Prox  69      18              calcific                   +----------+--------+--------+--------+------------------+--------+ ICA Mid   75      19                                         +----------+--------+--------+--------+------------------+--------+ ICA Distal54      15                                          +----------+--------+--------+--------+------------------+--------+ ECA       119     24              calcific                   +----------+--------+--------+--------+------------------+--------+ +----------+--------+--------+--------+-------------------+           PSV cm/sEDV cm/sDescribeArm Pressure (mmHG) +----------+--------+--------+--------+-------------------+ Subclavian141     41                                  +----------+--------+--------+--------+-------------------+ +---------+--------+--+--------+--+ VertebralPSV cm/s44EDV cm/s16 +---------+--------+--+--------+--+   Summary: Right Carotid: Velocities in the right ICA are consistent with a 1-39% stenosis.                The ECA appears <50% stenosed. Left Carotid: Velocities in the left ICA are consistent with a 1-39% stenosis.               The ECA appears <50% stenosed. Vertebrals:  Bilateral vertebral arteries demonstrate antegrade flow. Subclavians: Normal flow hemodynamics were seen in bilateral subclavian              arteries. *See table(s) above for measurements and observations.     Preliminary     Scheduled Meds:  aspirin EC  81 mg Oral Daily   atorvastatin  80 mg Oral Daily   carvedilol  6.25 mg Oral BID   cholecalciferol  1,000 Units Oral Daily   cyanocobalamin  1,000 mcg Oral Daily   ferrous sulfate  325 mg Oral Once per day on Monday Wednesday Friday   hydrALAZINE  25 mg Oral Q8H   isosorbide mononitrate  30 mg Oral Daily   levothyroxine  100 mcg Oral Daily   multivitamin with minerals  1 tablet Oral Daily   pantoprazole  40 mg Oral Daily   sodium bicarbonate  650 mg Oral TID   sodium chloride flush  3 mL Intravenous Q12H   tamsulosin  0.4 mg Oral QPC supper   venetoclax  300 mg Oral Q breakfast    Continuous Infusions:  heparin 1,700 Units/hr (05/03/23 1731)     LOS: 4 days     Briant Cedar, MD [facility]  If 7PM-7AM, please contact  night-coverage [email or web address] 05/03/2023, 6:40 PM

## 2023-05-03 NOTE — Consult Note (Signed)
Pharmacy Consult Note - Anticoagulation  Pharmacy Consult for heparin Indication: chest pain/ACS  PATIENT MEASUREMENTS: Height: 6\' 2"  (188 cm) Weight: 102.3 kg (225 lb 9.8 oz) IBW/kg (Calculated) : 82.2 HEPARIN DW (KG): 101.2  VITAL SIGNS: Temp: 98.4 F (36.9 C) (12/05 0552) Temp Source: Oral (12/05 0552) BP: 127/80 (12/05 0552) Pulse Rate: 82 (12/05 0552)  Recent Labs    05/03/23 0358  HGB 11.0*  HCT 33.0*  PLT 76*  HEPARINUNFRC 0.41  CREATININE 3.27*    Estimated Creatinine Clearance: 21.8 mL/min (A) (by C-G formula based on SCr of 3.27 mg/dL (H)).  PAST MEDICAL HISTORY: Past Medical History:  Diagnosis Date   Arthritis    Ascending aortic aneurysm (HCC)    CHF (congestive heart failure) (HCC)    cardiac catheterization in 2007 had revealed LVEF of 35% with no significant coronary disease   CKD (chronic kidney disease), stage IV (HCC)    followed by [facility]   CLL (chronic lymphocytic leukemia) (HCC)    Complication of anesthesia    "wild when I woke up "   History of kidney stones    passed    Hypertension    NICM (nonischemic cardiomyopathy) (HCC)    Cardiac cath 2007, EF 35%, minimal CAD. b. EF normal 2018.   PAF (paroxysmal atrial fibrillation) (HCC)    Pinched nerve in neck    Pneumonia    SDH (subdural hematoma) (HCC)    Thrombocytopenia (HCC)     ASSESSMENT: 82 y.o. male with PMH including ICH, chronic lymphocytic leukemia and Afib s/p ablation (not currently on anticoagulation) is presenting with chest pain and left shoulder pain. cTn are trending up and ECG exhibits nonspecific T wave abnormalities. Head CT did not show any evidence of ICH. Patient is not on chronic anticoagulation per chart review. Pharmacy has been consulted to initiate and manage heparin intravenous infusion.  Heparin level 0.41 is therapeutic on 1700 units/hr.  Hgb 11, pltc 76 down slightly but overall stable.   S/p LHC with single-vessel prox LAD disease, TCTS to  evaluate for possible CABG.  Goal(s) of therapy: Heparin level 0.3 - 0.7 units/mL Monitor platelets by anticoagulation protocol: Yes   PLAN: Continue heparin infusion at 1700 units/hr Daily CBC and heparin level while on heparin Monitor s/s bleeding   Trixie Rude, PharmD Clinical Pharmacist 05/03/2023  7:08 AM

## 2023-05-03 NOTE — Plan of Care (Signed)

## 2023-05-04 ENCOUNTER — Inpatient Hospital Stay (HOSPITAL_COMMUNITY)
Admission: EM | Disposition: A | Discharge status: Home / Self Care | Payer: Self-pay | Source: Home / Self Care | Attending: Internal Medicine

## 2023-05-04 ENCOUNTER — Ambulatory Visit: Payer: Self-pay | Admitting: Emergency Medicine

## 2023-05-04 DIAGNOSIS — I214 Non-ST elevation (NSTEMI) myocardial infarction: Secondary | ICD-10-CM | POA: Diagnosis not present

## 2023-05-04 DIAGNOSIS — I2510 Atherosclerotic heart disease of native coronary artery without angina pectoris: Secondary | ICD-10-CM | POA: Diagnosis not present

## 2023-05-04 HISTORY — PX: CORONARY IMAGING/OCT: CATH118326

## 2023-05-04 HISTORY — PX: CORONARY STENT INTERVENTION: CATH118234

## 2023-05-04 LAB — BASIC METABOLIC PANEL
Anion gap: 8 (ref 5–15)
BUN: 38 mg/dL — ABNORMAL HIGH (ref 8–23)
CO2: 20 mmol/L — ABNORMAL LOW (ref 22–32)
Calcium: 9 mg/dL (ref 8.9–10.3)
Chloride: 112 mmol/L — ABNORMAL HIGH (ref 98–111)
Creatinine, Ser: 3.62 mg/dL — ABNORMAL HIGH (ref 0.61–1.24)
GFR, Estimated: 16 mL/min — ABNORMAL LOW (ref >60)
Glucose, Bld: 101 mg/dL — ABNORMAL HIGH (ref 70–99)
Potassium: 4.2 mmol/L (ref 3.5–5.1)
Sodium: 140 mmol/L (ref 135–145)

## 2023-05-04 LAB — CBC
HCT: 32.7 % — ABNORMAL LOW (ref 39.0–52.0)
Hemoglobin: 11.2 g/dL — ABNORMAL LOW (ref 13.0–17.0)
MCH: 34.8 pg — ABNORMAL HIGH (ref 26.0–34.0)
MCHC: 34.3 g/dL (ref 30.0–36.0)
MCV: 101.6 fL — ABNORMAL HIGH (ref 80.0–100.0)
Platelets: 78 10*3/uL — ABNORMAL LOW (ref 150–400)
RBC: 3.22 MIL/uL — ABNORMAL LOW (ref 4.22–5.81)
RDW: 13 % (ref 11.5–15.5)
WBC: 2.4 10*3/uL — ABNORMAL LOW (ref 4.0–10.5)
nRBC: 0 % (ref 0.0–0.2)

## 2023-05-04 LAB — HEPARIN LEVEL (UNFRACTIONATED): Heparin Unfractionated: 0.55 [IU]/mL (ref 0.30–0.70)

## 2023-05-04 LAB — POCT ACTIVATED CLOTTING TIME: Activated Clotting Time: 296 s

## 2023-05-04 SURGERY — CORONARY STENT INTERVENTION
Anesthesia: LOCAL

## 2023-05-04 MED ORDER — HYDRALAZINE HCL 20 MG/ML IJ SOLN: 5.0000 mg | INTRAMUSCULAR | Status: AC | PRN

## 2023-05-04 MED ORDER — SODIUM CHLORIDE 0.9% FLUSH: 3.0000 mL | Freq: Two times a day (BID) | INTRAVENOUS | Status: DC

## 2023-05-04 MED ORDER — MIDAZOLAM HCL 2 MG/2ML IJ SOLN
INTRAMUSCULAR | Status: DC | PRN
  Administered 2023-05-04 (×2): 1 mg via INTRAVENOUS

## 2023-05-04 MED ORDER — NITROGLYCERIN 1 MG/10 ML FOR IR/CATH LAB
INTRA_ARTERIAL | Status: AC
  Filled 2023-05-04: qty 10

## 2023-05-04 MED ORDER — VERAPAMIL HCL 2.5 MG/ML IV SOLN
INTRAVENOUS | Status: AC
Start: 2023-05-04 — End: ?
  Filled 2023-05-04: qty 2

## 2023-05-04 MED ORDER — TICAGRELOR 90 MG PO TABS
180.0000 mg | ORAL_TABLET | Freq: Once | ORAL | Status: AC
  Administered 2023-05-04: 180 mg via ORAL
  Filled 2023-05-04: qty 2

## 2023-05-04 MED ORDER — FENTANYL CITRATE (PF) 100 MCG/2ML IJ SOLN
INTRAMUSCULAR | Status: AC
  Filled 2023-05-04: qty 2

## 2023-05-04 MED ORDER — HEPARIN SODIUM (PORCINE) 1000 UNIT/ML IJ SOLN
INTRAMUSCULAR | Status: DC | PRN
  Administered 2023-05-04: 8000 [IU] via INTRAVENOUS
  Administered 2023-05-04: 3000 [IU] via INTRAVENOUS

## 2023-05-04 MED ORDER — LABETALOL HCL 5 MG/ML IV SOLN: 10.0000 mg | INTRAVENOUS | Status: AC | PRN

## 2023-05-04 MED ORDER — HEPARIN SODIUM (PORCINE) 1000 UNIT/ML IJ SOLN
INTRAMUSCULAR | Status: AC
  Filled 2023-05-04: qty 10

## 2023-05-04 MED ORDER — SODIUM CHLORIDE 0.9 % IV SOLN: INTRAVENOUS | Status: DC

## 2023-05-04 MED ORDER — SODIUM CHLORIDE 0.9 % IV SOLN: 250.0000 mL | INTRAVENOUS | Status: DC | PRN

## 2023-05-04 MED ORDER — MIDAZOLAM HCL 2 MG/2ML IJ SOLN
INTRAMUSCULAR | Status: AC
  Filled 2023-05-04: qty 2

## 2023-05-04 MED ORDER — IOHEXOL 350 MG/ML SOLN
INTRAVENOUS | Status: DC | PRN
  Administered 2023-05-04: 125 mL

## 2023-05-04 MED ORDER — HEPARIN (PORCINE) IN NACL 1000-0.9 UT/500ML-% IV SOLN
INTRAVENOUS | Status: DC | PRN
  Administered 2023-05-04 (×2): 500 mL

## 2023-05-04 MED ORDER — LIDOCAINE HCL (PF) 1 % IJ SOLN
INTRAMUSCULAR | Status: DC | PRN
  Administered 2023-05-04: 2 mL

## 2023-05-04 MED ORDER — FENTANYL CITRATE (PF) 100 MCG/2ML IJ SOLN
INTRAMUSCULAR | Status: DC | PRN
  Administered 2023-05-04 (×2): 25 ug via INTRAVENOUS

## 2023-05-04 MED ORDER — VERAPAMIL HCL 2.5 MG/ML IV SOLN
INTRAVENOUS | Status: DC | PRN
  Administered 2023-05-04: 10 mL via INTRA_ARTERIAL

## 2023-05-04 MED ORDER — SODIUM CHLORIDE 0.9% FLUSH: 3.0000 mL | INTRAVENOUS | Status: DC | PRN

## 2023-05-04 MED ORDER — LIDOCAINE HCL (PF) 1 % IJ SOLN
INTRAMUSCULAR | Status: AC
  Filled 2023-05-04: qty 30

## 2023-05-04 MED ORDER — TICAGRELOR 90 MG PO TABS
90.0000 mg | ORAL_TABLET | Freq: Two times a day (BID) | ORAL | Status: DC
  Administered 2023-05-05 (×2): 90 mg via ORAL
  Filled 2023-05-04 (×2): qty 1

## 2023-05-04 SURGICAL SUPPLY — 19 items
BALLN SAPPHIRE 3.0X12 (BALLOONS) ×1
BALLN ~~LOC~~ EMERGE MR 4.5X8 (BALLOONS) ×1
BALLOON SAPPHIRE 3.0X12 (BALLOONS) IMPLANT
BALLOON ~~LOC~~ EMERGE MR 4.5X8 (BALLOONS) IMPLANT
CATH DRAGONFLY OPSTAR (CATHETERS) IMPLANT
CATH LAUNCHER 6FR EBU3.5 (CATHETERS) IMPLANT
CATH VISTA GUIDE 6FR XBLAD3.5 (CATHETERS) IMPLANT
DEVICE RAD COMP TR BAND LRG (VASCULAR PRODUCTS) IMPLANT
ELECT DEFIB PAD ADLT CADENCE (PAD) IMPLANT
GLIDESHEATH SLEND A-KIT 6F 22G (SHEATH) IMPLANT
GUIDEWIRE INQWIRE 1.5J.035X260 (WIRE) IMPLANT
INQWIRE 1.5J .035X260CM (WIRE) ×1
KIT ENCORE 26 ADVANTAGE (KITS) IMPLANT
KIT HEART LEFT (KITS) IMPLANT
PACK CARDIAC CATHETERIZATION (CUSTOM PROCEDURE TRAY) ×1 IMPLANT
STENT SYNERGY XD 4.0X12 (Permanent Stent) IMPLANT
SYNERGY XD 4.0X12 (Permanent Stent) ×1 IMPLANT
TRANSDUCER W/STOPCOCK (MISCELLANEOUS) IMPLANT
TUBING CIL FLEX 10 FLL-RA (TUBING) IMPLANT

## 2023-05-04 NOTE — Progress Notes (Signed)
PROGRESS NOTE  LAD PLANE ZOX:[id number] DOB: 05/13/1941 DOA: 04/29/2023 PCP: Benita Stabile, MD  HPI/Recap of past 24 hours: Andrew [name] is a 82 y.o. male with a PMH significant for stage IV CLL, CKD stage IV, normocytic anemia, permanent atrial fibrillation not on anticoagulation due to prior subdural hematoma and chronic thrombocytopenia, HFpEF, ascending aortic aneurysm, and hypertension, presented from home to the ED on 04/29/2023 with chest discomfort x 2 days and radiated to the left shoulder. Found to have NSTEMI and started on heparin gtt and transferred from [facility] to [facility]. Patient was admitted to medicine service for further workup and management of NSTEMI.    Today, patient denied any new complaints.    Assessment/Plan: Principal Problem:   NSTEMI (non-ST elevated myocardial infarction) (HCC) Active Problems:   CLL (chronic lymphocytic leukemia) (HCC)   Subdural hematoma (HCC)   A-fib (HCC)   CKD (chronic kidney disease) stage 5, GFR less than 15 ml/min (HCC)   Iron deficiency anemia   CKD (chronic kidney disease), stage IV (HCC)   HFrEF (heart failure with reduced ejection fraction) (HCC)   Atrial fibrillation, chronic (HCC)   NSTEMI Ischemic cardiomyopathy Chest pain free currently Troponin peaked at 7531 2D echocardiogram with reduced EF of 25 to 30%, regional wall motion abnormality Cardiology consulted, s/p cardiac cath showed single-vessel proximal LAD, recommend CABG initially then reevaluated in Heart team multidisciplinary discussion and the consensus was to attempt PCI with DES which was done on 05/04/2023  Continue aspirin, Coreg, Imdur, hydralazine, start Brilinta Continue telemetry monitoring  CKD stage IV Creatinine stable Status post AV fistula, but not currently started on HD Continue bicarb Follows with nephrology, Dr. Arrie Aran outpatient Daily BMP, monitor renal function closely due to cath x 2  HTN Continue coreg, imdur, hydralazine    Permanent atrial fibrillation Not on anticoagulation due to prior subdural hematoma Continue Coreg 6.25 mg twice daily   Prior subdural hematoma Status post craniotomy with evacuation 09/2016 Neurological baseline currently without focal changes CT head with no acute findings Monitor neurological status carefully while on a heparin drip  CLL/chronic thrombocytopenia Pancytopenia Followed by Dr. Ellin Saba Continue venetoclax   Chronic normocytic anemia Continue iron tablets 3 times weekly Daily CBC    Estimated body mass index is 28.97 kg/m as calculated from the following:   Height as of this encounter: 6\' 2"  (1.88 m).   Weight as of this encounter: 102.3 kg.     Code Status: Full  Family Communication: Wife at bedside  Disposition Plan: Status is: Inpatient Remains inpatient appropriate because: level of care      Consultants: Cardiology Cardiothoracic surgery  Procedures: LHC X 2  Antimicrobials: None  DVT prophylaxis: SCD   Objective: Vitals:   05/04/23 1331 05/04/23 1336 05/04/23 1336 05/04/23 1513  BP: (!) 160/84  (!) 153/78 (!) 169/85  Pulse: (!) 57 92 69   Resp: (!) 23  (!) 24   Temp:    97.6 F (36.4 C)  TempSrc:      SpO2: 100%     Weight:      Height:        Intake/Output Summary (Last 24 hours) at 05/04/2023 1745 Last data filed at 05/04/2023 1710 Gross per 24 hour  Intake 360 ml  Output 450 ml  Net -90 ml   Filed Weights   04/29/23 0820 04/30/23 0621 05/01/23 0559  Weight: 101.2 kg 102.9 kg 102.3 kg    Exam: General: NAD  Cardiovascular: S1, S2 present  Respiratory: CTAB Abdomen: Soft, nontender, nondistended, bowel sounds present Musculoskeletal: No bilateral pedal edema noted Skin: Normal Psychiatry: Normal mood     Data Reviewed: CBC: Recent Labs  Lab 04/30/23 0342 05/01/23 0257 05/02/23 0412 05/03/23 0358 05/04/23 0408  WBC 3.0* 3.2* 2.9* 2.8* 2.4*  HGB 11.6* 11.1* 11.1* 11.0* 11.2*  HCT 33.6* 32.9*  33.1* 33.0* 32.7*  MCV 100.3* 100.0 101.2* 100.9* 101.6*  PLT 83* 82* 79* 76* 78*   Basic Metabolic Panel: Recent Labs  Lab 04/30/23 0342 05/01/23 0257 05/02/23 0412 05/03/23 0358 05/04/23 0408  NA 139 142 141 139 140  K 3.8 4.0 3.8 4.2 4.2  CL 110 113* 111 110 112*  CO2 22 21* 19* 21* 20*  GLUCOSE 106* 115* 119* 103* 101*  BUN 37* 32* 30* 31* 38*  CREATININE 3.38* 3.38* 3.06* 3.27* 3.62*  CALCIUM 8.9 8.6* 8.5* 8.7* 9.0  MG 2.2  --   --   --   --    GFR: Estimated Creatinine Clearance: 19.7 mL/min (A) (by C-G formula based on SCr of 3.62 mg/dL (H)). Liver Function Tests: No results for input(s): "AST", "ALT", "ALKPHOS", "BILITOT", "PROT", "ALBUMIN" in the last 168 hours. No results for input(s): "LIPASE", "AMYLASE" in the last 168 hours. No results for input(s): "AMMONIA" in the last 168 hours. Coagulation Profile: Recent Labs  Lab 04/29/23 1111  INR 1.1   Cardiac Enzymes: No results for input(s): "CKTOTAL", "CKMB", "CKMBINDEX", "TROPONINI" in the last 168 hours. BNP (last 3 results) No results for input(s): "PROBNP" in the last 8760 hours. HbA1C: No results for input(s): "HGBA1C" in the last 72 hours. CBG: No results for input(s): "GLUCAP" in the last 168 hours. Lipid Profile: No results for input(s): "CHOL", "HDL", "LDLCALC", "TRIG", "CHOLHDL", "LDLDIRECT" in the last 72 hours.  Thyroid Function Tests: No results for input(s): "TSH", "T4TOTAL", "FREET4", "T3FREE", "THYROIDAB" in the last 72 hours.  Anemia Panel: No results for input(s): "VITAMINB12", "FOLATE", "FERRITIN", "TIBC", "IRON", "RETICCTPCT" in the last 72 hours. Urine analysis:    Component Value Date/Time   COLORURINE STRAW (A) 12/17/2017 1725   APPEARANCEUR CLEAR 12/17/2017 1725   LABSPEC 1.010 12/17/2017 1725   PHURINE 6.0 12/17/2017 1725   GLUCOSEU NEGATIVE 12/17/2017 1725   HGBUR MODERATE (A) 12/17/2017 1725   BILIRUBINUR NEGATIVE 12/17/2017 1725   KETONESUR NEGATIVE 12/17/2017 1725    PROTEINUR 30 (A) 12/17/2017 1725   NITRITE NEGATIVE 12/17/2017 1725   LEUKOCYTESUR TRACE (A) 12/17/2017 1725   Sepsis Labs: @LABRCNTIP (procalcitonin:4,lacticidven:4)  )No results found for this or any previous visit (from the past 240 hour(s)).    Studies: CARDIAC CATHETERIZATION  Result Date: 05/04/2023 Images from the original result were not included. Staged coronary intervention 05/04/2023: OCT guided PTCA and stenting of the ostial/proximal LAD with implantation of a 4.0 x 12 mm Synergy XD DES postdilated with a 4.5 x 8 mm emerge [place], stenosis reduced from 90% to 0% with TIMI-3 to TIMI-3 flow. Minimal luminal area around 2-2.5 preprocedure increase to 11 mm, well opposed stent struts from the ostium to proximal to aneurysmal dilatation.  No free-floating stent struts visualized. Recommendation: Gentle hydration overnight, follow-up serum creatinine tomorrow, if stable can be discharged home maybe tomorrow afternoon or day after if stable with outpatient follow-up and follow-up of serum creatinine in a week to 2 weeks. 125 mL contrast utilized.    Scheduled Meds:  aspirin EC  81 mg Oral Daily   atorvastatin  80 mg Oral Daily   carvedilol  6.25 mg Oral BID  cholecalciferol  1,000 Units Oral Daily   cyanocobalamin  1,000 mcg Oral Daily   ferrous sulfate  325 mg Oral Once per day on Monday Wednesday Friday   hydrALAZINE  25 mg Oral Q8H   isosorbide mononitrate  30 mg Oral Daily   levothyroxine  100 mcg Oral Daily   multivitamin with minerals  1 tablet Oral Daily   nitroGLYCERIN       pantoprazole  40 mg Oral Daily   sodium bicarbonate  650 mg Oral TID   sodium chloride flush  3 mL Intravenous Q12H   sodium chloride flush  3 mL Intravenous Q12H   tamsulosin  0.4 mg Oral QPC supper   ticagrelor  90 mg Oral BID   venetoclax  300 mg Oral Q breakfast    Continuous Infusions:  sodium chloride 50 mL/hr at 05/04/23 1514   sodium chloride       LOS: 5 days     Briant Cedar, MD [facility]  If 7PM-7AM, please contact night-coverage [email or web address] 05/04/2023, 5:45 PM

## 2023-05-04 NOTE — Progress Notes (Signed)
Pt undergoing PCI today, will see and educate after.

## 2023-05-04 NOTE — Interval H&P Note (Signed)
I have discussed with the patient, also called his daughter over the telephone, extensively discussed with him regarding the risks associated with PCI including but not limited to complete renal failure as he is very close to dialysis, complications of bleeding, thrombocytopenia, they are aware of the risk and willing to proceed.  All questions have been answered.

## 2023-05-04 NOTE — Consult Note (Signed)
Pharmacy Consult Note - Anticoagulation  Pharmacy Consult for heparin Indication: chest pain/ACS  PATIENT MEASUREMENTS: Height: 6\' 2"  (188 cm) Weight: 102.3 kg (225 lb 9.8 oz) IBW/kg (Calculated) : 82.2 HEPARIN DW (KG): 101.2  VITAL SIGNS: Temp: 97.7 F (36.5 C) (12/06 0405) Temp Source: Oral (12/06 0405) BP: 161/77 (12/06 0405) Pulse Rate: 79 (12/06 0405)  Recent Labs    05/04/23 0408  HGB 11.2*  HCT 32.7*  PLT 78*  HEPARINUNFRC 0.55  CREATININE 3.62*    Estimated Creatinine Clearance: 19.7 mL/min (A) (by C-G formula based on SCr of 3.62 mg/dL (H)).  PAST MEDICAL HISTORY: Past Medical History:  Diagnosis Date   Arthritis    Ascending aortic aneurysm (HCC)    CHF (congestive heart failure) (HCC)    cardiac catheterization in 2007 had revealed LVEF of 35% with no significant coronary disease   CKD (chronic kidney disease), stage IV (HCC)    followed by [facility]   CLL (chronic lymphocytic leukemia) (HCC)    Complication of anesthesia    "wild when I woke up "   History of kidney stones    passed    Hypertension    NICM (nonischemic cardiomyopathy) (HCC)    Cardiac cath 2007, EF 35%, minimal CAD. b. EF normal 2018.   PAF (paroxysmal atrial fibrillation) (HCC)    Pinched nerve in neck    Pneumonia    SDH (subdural hematoma) (HCC)    Thrombocytopenia (HCC)     ASSESSMENT: 82 y.o. male with PMH including ICH, chronic lymphocytic leukemia and Afib s/p ablation (not currently on anticoagulation) is presenting with chest pain and left shoulder pain. cTn are trending up and ECG exhibits nonspecific T wave abnormalities. Head CT did not show any evidence of ICH. Patient is not on chronic anticoagulation per chart review. Pharmacy has been consulted to initiate and manage heparin intravenous infusion.  Heparin level 0.55 is therapeutic on 1700 units/hr.  Hgb 11.2, pltc 78 stable (chronic thrombocytopenia at [facility]).   S/p LHC with single-vessel prox LAD disease,  plans for CABG 12/11.  Goal(s) of therapy: Heparin level 0.3 - 0.7 units/mL Monitor platelets by anticoagulation protocol: Yes   PLAN: Continue heparin infusion at 1700 units/hr Daily CBC and heparin level while on heparin Monitor s/s bleeding   Trixie Rude, PharmD Clinical Pharmacist 05/04/2023  7:04 AM

## 2023-05-04 NOTE — Heart Team MDD (Signed)
   Heart Team Multi-Disciplinary Discussion  Patient: Andrew [name]  DOB: May 20, 1941  MRN: [id number]   Date: 05/04/2023  8:45 AM    Attendees: Interventional Cardiology: Yates Decamp, MD Bryan Lemma, MD Nanetta Batty, MD Lorine Bears, MD Dorothyann Peng, MD Peter [place], MD  Cardiothoracic Surgery: Brynda Greathouse, MD   Additional Attendees: Michele Rockers, MD   Patient History: 82 y.o. male with a hx of systolic heart failure with improved EF, CLL, thrombocytopenia, CKD stage IV-V, permanent atrial fibrillation not on anticoagulation therapy due to prior history of subdural hematoma and chronic thrombocytopenia, subdural hematoma s/p craniotomy with evacuation 09/2016, hypertension and hyperlipidemia. Recent hospitalization due to NSTEMI. High-sensitivity troponin peaked at 7531.      Risk Factors: Chronic Kidney Disease Hypertension Hyperlipidemia Additional Risk Factors: A Fib, HF, CLL     Review of Prior Angiography and PCI Procedures: Left heart cath and coronary angiography completed on 12/3 images were reveiewed and discussed in detail including severe single-vessel proximal LAD stenosis of 99% with large poststenotic dilatation.   Discussion: After presentation, consideration of treatment options occurred contrasting PCI versus LIMA to LAD single vessel CABG. Given patients history, risks associated with surgery and comorbidities the team consensus was for PCI to LAD.    Recommendations: PCI      Alison Murray, RN  05/04/2023 8:45 AM

## 2023-05-04 NOTE — H&P (Signed)
Patient seen and examined today.  Vitals:   05/04/23 0808 05/04/23 1224  BP: 131/77 122/76  Pulse: 80 63  Resp: 16 18  Temp: 97.9 F (36.6 C) (!) 97.5 F (36.4 C)  SpO2: 96%    Cardiac exam S1-S2 is normal, no gallop. Chest exam clear to auscultate. Abdomen soft bowel sounds heard.  Extremities are normal with full range of movements, no edema.  I have discussed with the patient extensively regarding the discussion that we had at the heart team meeting earlier this morning.  It was unanimously decided that in a patient with single vessel disease, PCI would be the best option as surgical mortality and morbidity would be extremely high.  Plan is to proceed with image guided PCI to the ostium of the LAD with a short stent.  Heart team discussion is as noted below.  Attendees: Interventional Cardiology: Yates Decamp, MD Bryan Lemma, MD Nanetta Batty, MD Lorine Bears, MD Dorothyann Peng, MD Peter [place], MD   Cardiothoracic Surgery: Brynda Greathouse, MD     Additional Attendees: Michele Rockers, MD     Patient History: 82 y.o. male with a hx of systolic heart failure with improved EF, CLL, thrombocytopenia, CKD stage IV-V, permanent atrial fibrillation not on anticoagulation therapy due to prior history of subdural hematoma and chronic thrombocytopenia, subdural hematoma s/p craniotomy with evacuation 09/2016, hypertension and hyperlipidemia. Recent hospitalization due to NSTEMI. High-sensitivity troponin peaked at 7531.        Risk Factors: Chronic Kidney Disease Hypertension Hyperlipidemia Additional Risk Factors: A Fib, HF, CLL         Review of Prior Angiography and PCI Procedures: Left heart cath and coronary angiography completed on 12/3 images were reveiewed and discussed in detail including severe single-vessel proximal LAD stenosis of 99% with large poststenotic dilatation.     Discussion: After presentation, consideration of treatment options occurred  contrasting PCI versus LIMA to LAD single vessel CABG. Given patients history, risks associated with surgery and comorbidities the team consensus was for PCI to LAD.      Recommendations: PCI         Alison Murray, RN  05/04/2023 8:45 AM

## 2023-05-04 NOTE — Interval H&P Note (Signed)
History and Physical Interval Note:  05/04/2023 1:00 PM  CAVAN CICCARELLO  has presented today for surgery, with the diagnosis of cad.  The various methods of treatment have been discussed with the patient and family. After consideration of risks, benefits and other options for treatment, the patient has consented to  Procedure(s): CORONARY STENT INTERVENTION (N/A) as a surgical intervention.  The patient's history has been reviewed, patient examined, no change in status, stable for surgery.  I have reviewed the patient's chart and labs.  Questions were answered to the patient's satisfaction.     Yates Decamp

## 2023-05-05 ENCOUNTER — Encounter (HOSPITAL_COMMUNITY): Payer: Self-pay | Admitting: Hematology

## 2023-05-05 ENCOUNTER — Other Ambulatory Visit (HOSPITAL_COMMUNITY): Payer: Self-pay

## 2023-05-05 DIAGNOSIS — I214 Non-ST elevation (NSTEMI) myocardial infarction: Secondary | ICD-10-CM | POA: Diagnosis not present

## 2023-05-05 LAB — BASIC METABOLIC PANEL
Anion gap: 11 (ref 5–15)
BUN: 41 mg/dL — ABNORMAL HIGH (ref 8–23)
CO2: 17 mmol/L — ABNORMAL LOW (ref 22–32)
Calcium: 8.8 mg/dL — ABNORMAL LOW (ref 8.9–10.3)
Chloride: 110 mmol/L (ref 98–111)
Creatinine, Ser: 3.35 mg/dL — ABNORMAL HIGH (ref 0.61–1.24)
GFR, Estimated: 18 mL/min — ABNORMAL LOW (ref >60)
Glucose, Bld: 95 mg/dL (ref 70–99)
Potassium: 4.1 mmol/L (ref 3.5–5.1)
Sodium: 138 mmol/L (ref 135–145)

## 2023-05-05 LAB — CBC
HCT: 32.2 % — ABNORMAL LOW (ref 39.0–52.0)
Hemoglobin: 10.8 g/dL — ABNORMAL LOW (ref 13.0–17.0)
MCH: 34.1 pg — ABNORMAL HIGH (ref 26.0–34.0)
MCHC: 33.5 g/dL (ref 30.0–36.0)
MCV: 101.6 fL — ABNORMAL HIGH (ref 80.0–100.0)
Platelets: 76 10*3/uL — ABNORMAL LOW (ref 150–400)
RBC: 3.17 MIL/uL — ABNORMAL LOW (ref 4.22–5.81)
RDW: 13.1 % (ref 11.5–15.5)
WBC: 3 10*3/uL — ABNORMAL LOW (ref 4.0–10.5)
nRBC: 0 % (ref 0.0–0.2)

## 2023-05-05 MED ORDER — HYDRALAZINE HCL 25 MG PO TABS: 25.0000 mg | ORAL_TABLET | Freq: Three times a day (TID) | ORAL | Status: DC

## 2023-05-05 MED ORDER — ISOSORBIDE MONONITRATE ER 60 MG PO TB24
60.0000 mg | ORAL_TABLET | Freq: Every day | ORAL | 0 refills | Status: AC
  Filled 2023-05-05: qty 90, 90d supply, fill #0

## 2023-05-05 MED ORDER — CARVEDILOL 6.25 MG PO TABS
6.2500 mg | ORAL_TABLET | Freq: Two times a day (BID) | ORAL | Status: DC
  Administered 2023-05-05: 6.25 mg via ORAL
  Filled 2023-05-05: qty 1

## 2023-05-05 MED ORDER — ASPIRIN 81 MG PO TBEC
81.0000 mg | DELAYED_RELEASE_TABLET | Freq: Every day | ORAL | 12 refills | Status: AC
  Filled 2023-05-05: qty 30, 30d supply, fill #0

## 2023-05-05 MED ORDER — HYDRALAZINE HCL 20 MG/ML IJ SOLN
5.0000 mg | Freq: Four times a day (QID) | INTRAMUSCULAR | Status: DC | PRN
  Administered 2023-05-05: 5 mg via INTRAVENOUS
  Filled 2023-05-05: qty 1

## 2023-05-05 MED ORDER — CARVEDILOL 12.5 MG PO TABS: 12.5000 mg | ORAL_TABLET | Freq: Two times a day (BID) | ORAL | Status: DC

## 2023-05-05 MED ORDER — CARVEDILOL 6.25 MG PO TABS
6.2500 mg | ORAL_TABLET | Freq: Once | ORAL | Status: AC
  Administered 2023-05-05: 6.25 mg via ORAL
  Filled 2023-05-05: qty 1

## 2023-05-05 MED ORDER — CARVEDILOL 12.5 MG PO TABS
12.5000 mg | ORAL_TABLET | Freq: Two times a day (BID) | ORAL | 0 refills | Status: DC
  Filled 2023-05-05: qty 180, 90d supply, fill #0

## 2023-05-05 MED ORDER — HYDRALAZINE HCL 25 MG PO TABS
25.0000 mg | ORAL_TABLET | Freq: Three times a day (TID) | ORAL | 0 refills | Status: AC
  Filled 2023-05-05: qty 240, 80d supply, fill #0

## 2023-05-05 MED ORDER — TICAGRELOR 90 MG PO TABS
90.0000 mg | ORAL_TABLET | Freq: Two times a day (BID) | ORAL | 0 refills | Status: DC
  Filled 2023-05-05: qty 180, 90d supply, fill #0

## 2023-05-05 MED ORDER — ATORVASTATIN CALCIUM 80 MG PO TABS
80.0000 mg | ORAL_TABLET | Freq: Every day | ORAL | 0 refills | Status: AC
  Filled 2023-05-05: qty 90, 90d supply, fill #0

## 2023-05-05 MED ORDER — HYDRALAZINE HCL 50 MG PO TABS: 50.0000 mg | ORAL_TABLET | Freq: Three times a day (TID) | ORAL | Status: DC

## 2023-05-05 MED ORDER — ISOSORBIDE MONONITRATE ER 30 MG PO TB24
30.0000 mg | ORAL_TABLET | Freq: Once | ORAL | Status: AC
Start: 2023-05-05 — End: 2023-05-05
  Administered 2023-05-05: 30 mg via ORAL
  Filled 2023-05-05: qty 1

## 2023-05-05 MED ORDER — ISOSORBIDE MONONITRATE ER 60 MG PO TB24: 60.0000 mg | ORAL_TABLET | Freq: Every day | ORAL | Status: DC

## 2023-05-05 NOTE — Progress Notes (Signed)
  Patient's nurse reported that patient blood pressure is elevated 170/82 MAP 108.  Patient just got oral hydralazine 25 mg.  Reported to watch for the next 30 minutes to monitor for improvement of blood pressure and ordered hydralazine 5 mg every 6 as needed for systolic blood pressure above 914 or diastolic blood pressure above 782.  Per chart review patient has been admitted for NSTMI underwent heart cath today.  Currently on aspirin, Coreg, Imdur hydralazine and Brilinta.

## 2023-05-05 NOTE — Progress Notes (Addendum)
CARDIAC REHAB PHASE I   PRE:  Rate/Rhythm: 90 afib    BP: sitting 147/91 lower right leg    SpO2:   MODE:  Ambulation: 470 ft   POST:  Rate/Rhythm: 109 afib    BP: sitting 174/85 lower right leg    SpO2: 97 RA  No upper options for BP therefore taken at ankle.  Ambulated without assist. Endorses SOB at end of walk. VSS. He also has been having intermittent SOB that sounds like Brilinta response. Daughter is bringing caffeine later.   Discussed with pt and wife MI, stent, Brilinta importance, HF management, low sodium diet, exercise, NTG, and CRPII. Pt receptive with appropriate questions. Will refer to [place] CRPII. Encouraged ambulating as tolerated while awaiting d/c.  [id number]   Ethelda Chick BS, ACSM-CEP 05/05/2023 9:24 AM

## 2023-05-05 NOTE — Progress Notes (Addendum)
Patient Name: Andrew [name] Date of Encounter: 05/05/2023 [facility] Cardiologist: Dina Rich, MD   Interval Summary  .    Underwent PCI to LAD yesterday with Dr. Jacinto Halim. No acute overnight events. Feels well today. No new or acute complaints.  Vital Signs .    Vitals:   05/05/23 0536 05/05/23 0633 05/05/23 0720 05/05/23 0830  BP: (!) 170/82 (!) 192/94 (!) 181/81 (!) 147/91  Pulse:      Resp:      Temp:      TempSrc:      SpO2:      Weight:      Height:        Intake/Output Summary (Last 24 hours) at 05/05/2023 1027 Last data filed at 05/05/2023 3086 Gross per 24 hour  Intake 1339 ml  Output 2550 ml  Net -1211 ml      05/01/2023    5:59 AM 04/30/2023    6:21 AM 04/29/2023    8:20 AM  Last 3 Weights  Weight (lbs) 225 lb 9.8 oz 226 lb 13.7 oz 223 lb  Weight (kg) 102.336 kg 102.9 kg 101.152 kg      Telemetry/ECG    Sinus rhythm - Personally Reviewed  Physical Exam .   GEN: No acute distress.   Neck: No JVD. Cardiac: Normal rate, regular rhythm. Respiratory: Normal work of breathing.  GI: Soft, nontender, non-distended  MS: No edema.  Assessment & Plan .     Mr. Latavion Brittan is an 82 y.o. 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 Lowell Guitar.   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 Discussed single vessel CABG vs PCI. Patient underwent PCI to LAD on 12/6 with Dr. Jacinto Halim.   #NSTEMI #LAD stenosis s/p PCI  - Continue Aspirin and Ticagrelor.  - Continue Atorvastatin  - Uptitrate Carvedilol to 12.5 mg twice daily.  Continue hydralazine 25mg  TID. Uptitrate Imdur to 60 mg daily.   #Chronic systolic heart failure #ICM -- Echo with LVEF of 25 to 30%, mid to distal septum, apical anterior and apex akinesis, no LV thrombus, moderately enlarged RV, severe right atrial enlargement, moderate left atrial enlargement -GDMT: Uptitrate Carvedilol to 12.5 mg twice daily. Continue hydralazine 25mg  TID. Uptitrate Imdur to 60 mg daily. Unable to add additional therapy secondary to renal disease   #CKD stage IV - Follows with Dr. Arrie Aran as an outpatient - Has a left arm AV fistula in preparation for future dialysis - Creatinine stable post PCI today.   #CLL #Thrombocytopenia - Follows with oncology as an outpatient, platelet count remains stable around 75-80,000 - On venetoclax   #Hypertension: Hypertensive since PCI, although NIBP cuff now on leg. - Uptitrate Carvedilol to 12.5 mg twice daily. Continue hydralazine 25mg  TID. Uptitrate Imdur to 60 mg daily. - Primary team can continue to uptitrate the above meds as needed/tolerated.    #Hyperlipidemia - LDL 102 - Continue atorvastatin 80 mg daily   #Permanent atrial fibrillation - Rates controlled, not on anticoagulation secondary to history of subdural hematoma. - CHADSVASC score of at least 5.  - Discussed Watchman briefly with patient.   Anticipate discharge today or  tomorrow. If kept overnight then recheck creatinine in the morning.   For questions or updates, please contact [facility] Please consult [email or web address] for contact info under      Signed, Nobie Putnam, MD

## 2023-05-05 NOTE — Progress Notes (Signed)
BP high but being checked in his LE.  Will not adjust BP meds for now.  Await cards for final recommendations  Marlin Canary DO

## 2023-05-05 NOTE — Discharge Summary (Signed)
Physician Discharge Summary  DHARMENDER MCPHEE ZOX:[id number] DOB: 1940/08/04 DOA: 04/29/2023  PCP: Benita Stabile, MD  Admit date: 04/29/2023 Discharge date: 05/05/2023  Admitted From: home Discharge disposition: home   Recommendations for Outpatient Follow-Up:   BMP 1 week   Discharge Diagnosis:   Principal Problem:   NSTEMI (non-ST elevated myocardial infarction) (HCC) Active Problems:   CLL (chronic lymphocytic leukemia) (HCC)   Subdural hematoma (HCC)   A-fib (HCC)   CKD (chronic kidney disease) stage 5, GFR less than 15 ml/min (HCC)   Iron deficiency anemia   CKD (chronic kidney disease), stage IV (HCC)   HFrEF (heart failure with reduced ejection fraction) (HCC)   Atrial fibrillation, chronic (HCC)    Discharge Condition: Improved.  Diet recommendation:Regular.  Wound care: None.  Code status: Full.   History of Present Illness:   [name] is an 82 y.o. 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 Lowell Guitar.    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 Discussed single vessel CABG vs PCI. Patient underwent PCI to LAD on 12/6 with Dr. Jacinto Halim.      Hospital Course by Problem:   NSTEMI/LAD stenosis s/p PCI  -  Aspirin and Ticagrelor.  - Continue Atorvastatin  - Increased to Carvedilol to 12.5 mg twice daily. Continue hydralazine 25mg  TID. Uptitrate Imdur to 60 mg daily.   Chronic systolic heart failure/ICM -- Echo with LVEF of 25 to 30%, mid to distal septum, apical anterior and apex akinesis, no LV thrombus,  moderately enlarged RV, severe right atrial enlargement, moderate left atrial enlargement - Unable to add additional therapy secondary to renal disease   CKD stage IV - Follows with Dr. Arrie Aran as an outpatient - Has a left arm AV fistula in preparation for future dialysis - Creatinine stable post PCI today.   CLL/Thrombocytopenia - Follows with oncology as an outpatient, platelet count remains stable around 75-80,000   Hypertension: Hypertensive since PCI, although NIBP cuff now on leg. - Uptitrate Carvedilol to 12.5 mg twice daily. Continue hydralazine 25mg  TID. Uptitrate Imdur to 60 mg daily. - outpatient follow up   Hyperlipidemia - Continue atorvastatin 80 mg daily   Permanent atrial fibrillation - Rates controlled, not on anticoagulation secondary to history of subdural hematoma.     Medical Consultants:   cards   Discharge Exam:   Vitals:   05/05/23 0720 05/05/23 0830  BP: (!) 181/81 (!) 147/91  Pulse:    Resp:    Temp:    SpO2:     Vitals:   05/05/23 0536 05/05/23 0633 05/05/23 0720 05/05/23 0830  BP: (!) 170/82 (!) 192/94 (!) 181/81 (!) 147/91  Pulse:      Resp:      Temp:      TempSrc:      SpO2:      Weight:      Height:        General exam: Appears calm and comfortable.   The results of significant diagnostics from this hospitalization (including imaging, microbiology, ancillary and laboratory) are listed below for reference.     Procedures and Diagnostic Studies:  ECHOCARDIOGRAM COMPLETE  Result Date: 04/29/2023    ECHOCARDIOGRAM REPORT   Patient Name:   Andrew [name] Date of Exam: 04/29/2023 Medical Rec #:  [id number]      Height:       74.0 in Accession #:    [id number]     Weight:       223.0 lb Date of Birth:  May 28, 1941     BSA:          2.276 m Patient Age:    82 years       BP:           140/94 mmHg Patient Gender: M              HR:           68 bpm. [facility] Procedure: 2D Echo, Cardiac Doppler, Color Doppler and  Intracardiac            Opacification Agent Indications:    R07.9* Chest pain, unspecified  History:        Patient has prior history of Echocardiogram examinations, most                 recent 08/14/2016. Previous Myocardial Infarction, Abnormal ECG,                 Arrythmias:Atrial Fibrillation, Signs/Symptoms:Chest Pain; Risk                 Factors:Hypertension. Leukemia.  Sonographer:    Sheralyn Boatman RDCS Referring Phys: [id number] Lamont Dowdy [facility]  Sonographer Comments: Technically difficult study due to poor echo windows. IMPRESSIONS  1. Mid to distal deptum, apical anterior segment, and apex are akinetic. No LV thrombus on contrast imaging. Other segments are hypokinetic. Left ventricular ejection fraction, by estimation, is 25 to 30%. The left ventricle has severely decreased function. The left ventricle demonstrates regional wall motion abnormalities (see scoring diagram/findings for description). The left ventricular internal cavity size was mildly dilated. There is mild left ventricular hypertrophy. Left ventricular diastolic parameters are indeterminate.  2. Right ventricular systolic function is normal. The right ventricular size is moderately enlarged. Tricuspid regurgitation signal is inadequate for assessing PA pressure.  3. Left atrial size was moderately dilated.  4. Right atrial size was severely dilated.  5. The mitral valve is normal in structure. Mild mitral valve regurgitation. No evidence of mitral stenosis.  6. The aortic valve is tricuspid. There is mild calcification of the aortic valve. There is mild thickening of the aortic valve. Aortic valve regurgitation is trivial. Aortic valve sclerosis is present, with no evidence of aortic valve stenosis.  7. Aortic dilatation noted. There is moderate dilatation of the ascending aorta, measuring 44 mm.  8. The inferior vena cava is dilated in size with >50% respiratory variability, suggesting right atrial pressure of 8 mmHg. Comparison(s): The left  ventricular function is significantly worse. Conclusion(s)/Recommendation(s): Findings consistent with ischemic cardiomyopathy. No left ventricular mural or apical thrombus/thrombi. FINDINGS  Left Ventricle: Mid to distal deptum, apical anterior segment, and apex are akinetic. No LV thrombus on contrast imaging. Other segments are hypokinetic. Left ventricular ejection fraction, by estimation, is 25 to 30%. The left ventricle has severely decreased function. The left ventricle demonstrates regional wall motion abnormalities. Definity contrast agent was given IV to delineate the left ventricular endocardial borders. The left ventricular internal cavity size was mildly dilated. There is mild left ventricular hypertrophy. Left ventricular diastolic function could not be evaluated due to atrial fibrillation.  Left ventricular diastolic parameters are indeterminate.  LV Wall Scoring: The mid and distal anterior septum, apical anterior segment, and apex are akinetic. Right Ventricle: The right ventricular size is moderately enlarged. No increase in right ventricular wall thickness. Right ventricular systolic function is normal. Tricuspid regurgitation signal is inadequate for assessing PA pressure. Left Atrium: Left atrial size was moderately dilated. Right Atrium: Right atrial size was severely dilated. Pericardium: There is no evidence of pericardial effusion. Mitral Valve: The mitral valve is normal in structure. Mild mitral valve regurgitation. No evidence of mitral valve stenosis. Tricuspid Valve: The tricuspid valve is normal in structure. Tricuspid valve regurgitation is mild . No evidence of tricuspid stenosis. Aortic Valve: The aortic valve is tricuspid. There is mild calcification of the aortic valve. There is mild thickening of the aortic valve. Aortic valve regurgitation is trivial. Aortic valve sclerosis is present, with no evidence of aortic valve stenosis. Pulmonic Valve: The pulmonic valve was grossly  normal. Pulmonic valve regurgitation is mild. No evidence of pulmonic stenosis. Aorta: Aortic dilatation noted and the aortic root is normal in size and structure. There is moderate dilatation of the ascending aorta, measuring 44 mm. Venous: The inferior vena cava is dilated in size with greater than 50% respiratory variability, suggesting right atrial pressure of 8 mmHg. IAS/Shunts: No atrial level shunt detected by color flow Doppler.  LEFT VENTRICLE PLAX 2D LVIDd:         5.60 cm LVIDs:         4.10 cm LV PW:         1.40 cm LV IVS:        1.20 cm LVOT diam:     2.55 cm LV SV:         81 LV SV Index:   36 LVOT Area:     5.11 cm  LV Volumes (MOD) LV vol d, MOD A2C: 152.0 ml LV vol d, MOD A4C: 160.5 ml LV vol s, MOD A2C: 118.5 ml LV vol s, MOD A4C: 97.3 ml LV SV MOD A2C:     33.5 ml LV SV MOD A4C:     160.5 ml LV SV MOD BP:      48.0 ml RIGHT VENTRICLE             IVC RV S prime:     13.20 cm/s  IVC diam: 2.60 cm TAPSE (M-mode): 1.7 cm LEFT ATRIUM              Index        RIGHT ATRIUM           Index LA diam:        4.30 cm  1.89 cm/m   RA Area:     29.60 cm LA Vol (A2C):   159.0 ml 69.85 ml/m  RA Volume:   98.20 ml  43.14 ml/m LA Vol (A4C):   103.0 ml 45.25 ml/m LA Biplane Vol: 129.0 ml 56.67 ml/m  AORTIC VALVE             PULMONIC VALVE LVOT Vmax:   86.40 cm/s  PR End Diast Vel: 1.33 msec LVOT Vmean:  51.400 cm/s LVOT VTI:    0.159 m  AORTA Ao Root diam: 3.90 cm Ao Asc diam:  4.40 cm MITRAL VALVE MV Area (PHT): 4.49 cm     SHUNTS MV Decel Time: 169 msec     Systemic VTI:  0.16 m MV E velocity: 105.00 cm/s  Systemic Diam: 2.55 cm Lennie Odor  MD Electronically signed by Lennie Odor MD Signature Date/Time: 04/29/2023/3:50:35 PM    Final    CT Head Wo Contrast  Result Date: 04/29/2023 CLINICAL DATA:  Chest pain. Personal history of intracranial hemorrhage. Need to start heparin. EXAM: CT HEAD WITHOUT CONTRAST TECHNIQUE: Contiguous axial images were obtained from the base of the skull through the  vertex without intravenous contrast. RADIATION DOSE REDUCTION: This exam was performed according to the departmental dose-optimization program which includes automated exposure control, adjustment of the mA and/or kV according to patient size and/or use of iterative reconstruction technique. COMPARISON:  CT head without contrast 12/11/2016 FINDINGS: Brain: Mild atrophy and white matter changes are stable. The ventricles are of normal size. No significant extraaxial fluid collection is present. The brainstem and cerebellum are within normal limits. Craniotomy noted. No residual or recurrent intracranial hemorrhage is present. Vascular: No hyperdense vessel or unexpected calcification. Skull: Right frontal parietal craniotomy noted. Calvarium is otherwise intact. No significant extracranial soft tissue lesion is present. Sinuses/Orbits: The paranasal sinuses and mastoid air cells are clear. Bilateral lens replacements are noted. Globes and orbits are otherwise unremarkable. IMPRESSION: 1. No acute intracranial abnormality or significant interval change. 2. Stable atrophy and white matter disease. This likely reflects the sequela of chronic microvascular ischemia. 3. Right frontal parietal craniotomy without residual or recurrent intracranial hemorrhage. These results were called by telephone at the time of interpretation on 04/29/2023 at 10:48 am to provider Vonita Moss , who verbally acknowledged these results. Electronically Signed   By: Marin Roberts M.D.   On: 04/29/2023 11:09   DG Shoulder Left  Result Date: 04/29/2023 CLINICAL DATA:  82 year old male with history of left shoulder pain. EXAM: LEFT SHOULDER - 2+ VIEW COMPARISON:  No priors. FINDINGS: Three views of the left shoulder demonstrate no acute displaced fracture, subluxation or dislocation. There is joint space narrowing, subchondral sclerosis, subchondral cyst formation and osteophyte formation noted in the glenohumeral joint. IMPRESSION:  1. No acute abnormality of the left shoulder. 2. Glenohumeral joint osteoarthritis, as above. Electronically Signed   By: Trudie Reed M.D.   On: 04/29/2023 08:58   DG Chest 2 View  Result Date: 04/29/2023 CLINICAL DATA:  82 year old male with history of chest pain. EXAM: CHEST - 2 VIEW COMPARISON:  Chest x-ray 08/22/2017. FINDINGS: Lung volumes are normal. No consolidative airspace disease. No pleural effusions. No pneumothorax. No pulmonary nodule or mass noted. Pulmonary vasculature appears engorged. Heart size is mildly enlarged. Upper mediastinal contours are within normal limits. Atherosclerotic calcifications are noted in the thoracic aorta. Atherosclerosis in the thoracic aorta. IMPRESSION: 1. Cardiomegaly with pulmonary venous congestion, but no frank pulmonary edema. 2. Aortic atherosclerosis. Electronically Signed   By: Trudie Reed M.D.   On: 04/29/2023 08:57     Labs:   Basic Metabolic Panel: Recent Labs  Lab 04/30/23 0342 05/01/23 0257 05/02/23 0412 05/03/23 0358 05/04/23 0408 05/05/23 0258  NA 139 142 141 139 140 138  K 3.8 4.0 3.8 4.2 4.2 4.1  CL 110 113* 111 110 112* 110  CO2 22 21* 19* 21* 20* 17*  GLUCOSE 106* 115* 119* 103* 101* 95  BUN 37* 32* 30* 31* 38* 41*  CREATININE 3.38* 3.38* 3.06* 3.27* 3.62* 3.35*  CALCIUM 8.9 8.6* 8.5* 8.7* 9.0 8.8*  MG 2.2  --   --   --   --   --    GFR Estimated Creatinine Clearance: 21.3 mL/min (A) (by C-G formula based on SCr of 3.35 mg/dL (H)). Liver Function Tests:  No results for input(s): "AST", "ALT", "ALKPHOS", "BILITOT", "PROT", "ALBUMIN" in the last 168 hours. No results for input(s): "LIPASE", "AMYLASE" in the last 168 hours. No results for input(s): "AMMONIA" in the last 168 hours. Coagulation profile Recent Labs  Lab 04/29/23 1111  INR 1.1    CBC: Recent Labs  Lab 05/01/23 0257 05/02/23 0412 05/03/23 0358 05/04/23 0408 05/05/23 0258  WBC 3.2* 2.9* 2.8* 2.4* 3.0*  HGB 11.1* 11.1* 11.0* 11.2* 10.8*   HCT 32.9* 33.1* 33.0* 32.7* 32.2*  MCV 100.0 101.2* 100.9* 101.6* 101.6*  PLT 82* 79* 76* 78* 76*   Cardiac Enzymes: No results for input(s): "CKTOTAL", "CKMB", "CKMBINDEX", "TROPONINI" in the last 168 hours. BNP: Invalid input(s): "POCBNP" CBG: No results for input(s): "GLUCAP" in the last 168 hours. D-Dimer No results for input(s): "DDIMER" in the last 72 hours. Hgb A1c No results for input(s): "HGBA1C" in the last 72 hours. Lipid Profile No results for input(s): "CHOL", "HDL", "LDLCALC", "TRIG", "CHOLHDL", "LDLDIRECT" in the last 72 hours. Thyroid function studies No results for input(s): "TSH", "T4TOTAL", "T3FREE", "THYROIDAB" in the last 72 hours.  Invalid input(s): "FREET3" Anemia work up No results for input(s): "VITAMINB12", "FOLATE", "FERRITIN", "TIBC", "IRON", "RETICCTPCT" in the last 72 hours. Microbiology No results found for this or any previous visit (from the past 240 hour(s)).   Discharge Instructions:   Discharge Instructions     Amb Referral to Cardiac Rehabilitation   Complete by: As directed    Diagnosis:  Coronary Stents PTCA NSTEMI     After initial evaluation and assessments completed: Virtual Based Care may be provided alone or in conjunction with Phase 2 Cardiac Rehab based on patient barriers.: Yes   Intensive Cardiac Rehabilitation (ICR) [facility] location only OR Traditional Cardiac Rehabilitation (TCR) *If criteria for ICR are not met will enroll in TCR [facility] only): Yes   Diet - low sodium heart healthy   Complete by: As directed    Increase activity slowly   Complete by: As directed    No wound care   Complete by: As directed       Allergies as of 05/05/2023   No Known Allergies      Medication List     STOP taking these medications    amLODipine 5 MG tablet Commonly known as: NORVASC       TAKE these medications    acetaminophen 325 MG tablet Commonly known as: TYLENOL Take 650 mg by mouth every 6 (six) hours as needed for  mild pain (pain score 1-3).   aspirin EC 81 MG tablet Take 1 tablet (81 mg total) by mouth daily. Swallow whole. Start taking on: May 06, 2023   atorvastatin 80 MG tablet Commonly known as: LIPITOR Take 1 tablet (80 mg total) by mouth daily. Start taking on: May 06, 2023   carvedilol 12.5 MG tablet Commonly known as: COREG Take 1 tablet (12.5 mg total) by mouth 2 (two) times daily with a meal. What changed:  medication strength how much to take when to take this   ferrous sulfate 325 (65 FE) MG EC tablet Take 325 mg by mouth 3 (three) times a week.   hydrALAZINE 25 MG tablet Commonly known as: APRESOLINE Take 1 tablet (25 mg total) by mouth every 8 (eight) hours.   isosorbide mononitrate 60 MG 24 hr tablet Commonly known as: IMDUR Take 1 tablet (60 mg total) by mouth daily. Start taking on: May 06, 2023   levothyroxine 25 MCG tablet Commonly known as: SYNTHROID  Take 100 mcg by mouth daily.   multivitamin with minerals Tabs tablet Take 1 tablet by mouth daily.   omeprazole 20 MG capsule Commonly known as: PRILOSEC 1 PO 30 MINS PRIOR TO BREAKFAST. What changed:  how much to take how to take this when to take this additional instructions   sodium bicarbonate 650 MG tablet Take 650 mg by mouth 3 (three) times daily.   tamsulosin 0.4 MG Caps capsule Commonly known as: FLOMAX Take 0.4 mg by mouth daily.   ticagrelor 90 MG Tabs tablet Commonly known as: BRILINTA Take 1 tablet (90 mg total) by mouth 2 (two) times daily.   Venclexta 100 MG tablet Generic drug: venetoclax Take 3 tablets (300 mg total) by mouth daily.   Vitamin B12 1000 MCG Tbcr Take 1,000 mcg by mouth daily.   Vitamin D3 25 MCG (1000 UT) Caps Take 1 capsule by mouth daily.        Follow-up Information     Benita Stabile, MD Follow up in 1 week(s).   Specialty: Internal Medicine Why: BMP and BP check Contact information: [address] [place] [facility]  [id number] [phone]         Terrial Rhodes, MD Follow up.   Specialty: Nephrology Contact information: [address] [place] [address] [phone]                  Time coordinating discharge: 45 min  Signed:  Joseph Art DO  [facility] 05/05/2023, 12:05 PM

## 2023-05-07 ENCOUNTER — Encounter (HOSPITAL_COMMUNITY): Payer: Self-pay | Admitting: Cardiology

## 2023-05-07 ENCOUNTER — Telehealth: Payer: Self-pay

## 2023-05-07 MED FILL — Nitroglycerin IV Soln 100 MCG/ML in D5W: INTRA_ARTERIAL | Qty: 10 | Status: AC

## 2023-05-07 NOTE — Telephone Encounter (Signed)
-----   Message from Nobie Putnam sent at 05/05/2023 10:57 AM EST ----- Regarding: Watchman eval Can we have this patient see me in clinic for Watchman consult? Sometime in January is fine.   Thanks, [employer]

## 2023-05-07 NOTE — Telephone Encounter (Signed)
Called to arrange LAAO consult.  Left message to call back.

## 2023-05-08 DIAGNOSIS — I214 Non-ST elevation (NSTEMI) myocardial infarction: Secondary | ICD-10-CM | POA: Diagnosis not present

## 2023-05-08 DIAGNOSIS — N400 Enlarged prostate without lower urinary tract symptoms: Secondary | ICD-10-CM | POA: Diagnosis not present

## 2023-05-08 DIAGNOSIS — D631 Anemia in chronic kidney disease: Secondary | ICD-10-CM | POA: Diagnosis not present

## 2023-05-08 DIAGNOSIS — I5022 Chronic systolic (congestive) heart failure: Secondary | ICD-10-CM | POA: Diagnosis not present

## 2023-05-08 DIAGNOSIS — E039 Hypothyroidism, unspecified: Secondary | ICD-10-CM | POA: Diagnosis not present

## 2023-05-08 DIAGNOSIS — I130 Hypertensive heart and chronic kidney disease with heart failure and stage 1 through stage 4 chronic kidney disease, or unspecified chronic kidney disease: Secondary | ICD-10-CM | POA: Diagnosis not present

## 2023-05-08 DIAGNOSIS — K2270 Barrett's esophagus without dysplasia: Secondary | ICD-10-CM | POA: Diagnosis not present

## 2023-05-08 DIAGNOSIS — I480 Paroxysmal atrial fibrillation: Secondary | ICD-10-CM | POA: Diagnosis not present

## 2023-05-08 DIAGNOSIS — N184 Chronic kidney disease, stage 4 (severe): Secondary | ICD-10-CM | POA: Diagnosis not present

## 2023-05-08 DIAGNOSIS — D6949 Other primary thrombocytopenia: Secondary | ICD-10-CM | POA: Diagnosis not present

## 2023-05-08 DIAGNOSIS — C9110 Chronic lymphocytic leukemia of B-cell type not having achieved remission: Secondary | ICD-10-CM | POA: Diagnosis not present

## 2023-05-08 DIAGNOSIS — E782 Mixed hyperlipidemia: Secondary | ICD-10-CM | POA: Diagnosis not present

## 2023-05-09 ENCOUNTER — Encounter: Payer: Self-pay | Admitting: Student

## 2023-05-09 ENCOUNTER — Other Ambulatory Visit (HOSPITAL_COMMUNITY)
Admission: RE | Admit: 2023-05-09 | Discharge: 2023-05-09 | Disposition: A | Discharge status: Home / Self Care | Payer: Medicare HMO | Source: Ambulatory Visit | Attending: Student | Admitting: Student

## 2023-05-09 ENCOUNTER — Ambulatory Visit: Payer: Medicare HMO | Attending: Student | Admitting: Student

## 2023-05-09 ENCOUNTER — Telehealth (HOSPITAL_BASED_OUTPATIENT_CLINIC_OR_DEPARTMENT_OTHER): Payer: Self-pay | Admitting: Student

## 2023-05-09 VITALS — BP 104/60 | HR 60 | Ht 74.0 in | Wt 226.4 lb

## 2023-05-09 DIAGNOSIS — I5022 Chronic systolic (congestive) heart failure: Secondary | ICD-10-CM | POA: Insufficient documentation

## 2023-05-09 DIAGNOSIS — Z79899 Other long term (current) drug therapy: Secondary | ICD-10-CM

## 2023-05-09 DIAGNOSIS — N184 Chronic kidney disease, stage 4 (severe): Secondary | ICD-10-CM | POA: Diagnosis not present

## 2023-05-09 DIAGNOSIS — E785 Hyperlipidemia, unspecified: Secondary | ICD-10-CM | POA: Diagnosis not present

## 2023-05-09 DIAGNOSIS — I2510 Atherosclerotic heart disease of native coronary artery without angina pectoris: Secondary | ICD-10-CM | POA: Diagnosis not present

## 2023-05-09 DIAGNOSIS — I10 Essential (primary) hypertension: Secondary | ICD-10-CM | POA: Diagnosis not present

## 2023-05-09 DIAGNOSIS — I4821 Permanent atrial fibrillation: Secondary | ICD-10-CM

## 2023-05-09 LAB — BRAIN NATRIURETIC PEPTIDE: B Natriuretic Peptide: 277 pg/mL — ABNORMAL HIGH (ref 0.0–100.0)

## 2023-05-09 LAB — BASIC METABOLIC PANEL
Anion gap: 9 (ref 5–15)
BUN: 44 mg/dL — ABNORMAL HIGH (ref 8–23)
CO2: 20 mmol/L — ABNORMAL LOW (ref 22–32)
Calcium: 9 mg/dL (ref 8.9–10.3)
Chloride: 108 mmol/L (ref 98–111)
Creatinine, Ser: 3.71 mg/dL — ABNORMAL HIGH (ref 0.61–1.24)
GFR, Estimated: 15 mL/min — ABNORMAL LOW (ref >60)
Glucose, Bld: 103 mg/dL — ABNORMAL HIGH (ref 70–99)
Potassium: 4.5 mmol/L (ref 3.5–5.1)
Sodium: 137 mmol/L (ref 135–145)

## 2023-05-09 SURGERY — OFF PUMP CORONARY ARTERY BYPASS GRAFTING (CABG)
Anesthesia: General | Site: Chest

## 2023-05-09 NOTE — Patient Instructions (Addendum)
Medication Instructions:  Your physician recommends that you continue on your current medications as directed. Please refer to the Current Medication list given to you today.  Labwork: BMET & BNP today at [facility] Non-fasting  Testing/Procedures: Limited Echocardiogram in 1 month at our [place] location  Follow-Up: Your physician recommends that you schedule a follow-up appointment in: 3 months with Dr. Wyline Mood or [place].  Any Other Special Instructions Will Be Listed Below (If Applicable).  If you need a refill on your cardiac medications before your next appointment, please call your pharmacy.

## 2023-05-09 NOTE — Progress Notes (Addendum)
Cardiology Office Note    Date:  05/09/2023  ID:  DRAVIN SCHELLIN, DOB [birth year], MRN [id number] Cardiologist: Dina Rich, MD    History of Present Illness:    Andrew [name] is a 82 y.o. 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  He most recently presented to [facility] ED on 04/29/2023 for evaluation of chest pain and was found to have an NSTEMI with Hs Troponin elevated to 7531. He was transferred to [facility] for further evaluation and while there was risk of contrast-induced nephropathy given his creatinine at 3.1, it was felt he would benefit from a cardiac catheterization as echocardiogram had shown his EF was reduced to 25 to 30% with wall motion abnormalities noted. He underwent catheterization on 05/01/2023 which showed single-vessel proximal LAD stenosis and 50 to 60% mid RCA stenosis but FFR was negative. Was recommended to either proceed with single-vessel CABG with LIMA to LAD or PCI of the LAD. He was evaluated by CT Surgery and considered to be a candidate for off-pump CABG with atrial clipping.  However, his case was discussed at the heart team meeting and it was decided that PCI would be the best option. Underwent PCI/DES of the LAD on 05/04/2023. Renal function remained overall stable the following day with creatinine at 3.35 and he was discharged home. For medical therapy for his cardiomyopathy, Coreg was titrated to 12.5 mg twice daily and he was continued on Hydralazine 25 mg 3 times daily with Imdur being increased  to 60 mg daily. Additional GDMT was not added given his Stage IV CKD.  In talking with the patient and his wife today, he reports overall doing well since his recent hospitalization.  He has been feeling weak but this is starting to gradually improve. He does report some shortness of breath with exertion and is unsure if this is related to when he takes his medications. Was previously having some PND but now resolved. No abdominal distention or lower extremity edema. He denies any recent chest pain or palpitations.   Studies Reviewed:   EKG: EKG is not ordered today. EKG from 05/05/2023 is reviewed and shows rate-controlled atrial fibrillation, heart rate 79 with RAD.  Echocardiogram: 04/2023 IMPRESSIONS     1. Mid to distal deptum, apical anterior segment, and apex are akinetic.  No LV thrombus on contrast imaging. Other segments are hypokinetic. Left  ventricular ejection fraction, by estimation, is 25 to 30%. The left  ventricle has severely decreased  function. The left ventricle demonstrates regional wall motion  abnormalities (see scoring diagram/findings for description). The left  ventricular internal cavity size was mildly dilated. There is mild left  ventricular hypertrophy. Left ventricular  diastolic parameters are indeterminate.   2. Right ventricular systolic function is normal. The right ventricular  size is moderately enlarged. Tricuspid regurgitation signal is inadequate  for assessing PA pressure.   3. Left atrial size was moderately dilated.   4. Right atrial size was severely dilated.   5. The mitral valve is normal in structure. Mild mitral valve  regurgitation. No evidence of mitral  stenosis.   6. The aortic valve is tricuspid. There is mild calcification of the  aortic valve. There is mild thickening of the aortic valve. Aortic valve  regurgitation is trivial. Aortic valve sclerosis is present, with no  evidence of aortic valve stenosis.   7. Aortic dilatation  noted. There is moderate dilatation of the ascending  aorta, measuring 44 mm.   8. The inferior vena cava is dilated in size with >50% respiratory  variability, suggesting right atrial pressure of 8 mmHg.   Comparison(s): The left ventricular function is significantly worse.   Conclusion(s)/Recommendation(s): Findings consistent with ischemic  cardiomyopathy. No left ventricular mural or apical thrombus/thrombi.   Left Heart Catheterization 05/01/23: Hemodynamic data: LV 100/-1, EDP 6 mmHg.  Ao 97/67, mean 78 mmHg.  There was no pressure gradient across the aortic valve.   Angiographic data: RCA: Very large caliber vessel giving origin to large PDA that arises proximally at the crux and a smaller secondary PDA distally and a moderate to large PL branch.  And PL branch.  Mid RCA at the RV branch has a focal 50 to 60% stenosis, IFR negative for significance (1.0). LM: Large-caliber vessel, smooth and normal. LAD: Diffusely diseased in the ostium and proximal segment, there is a focal 99% stenosis followed by poststenotic dilatation focal up to the origin of D1 which is small to moderate-sized.  Large D2 with secondary branch.  Rest of the LAD has minimal disease. LCx: Very large caliber vessel giving origin to tiny OM1 large OM 2 and continues as a large OM 3.  Mid segment of LCx has a 50% stenosis, not hemodynamically significant (iFR 1.0) and OM2 has a mid 50% stenosis, not hemodynamically significant (iFR 1.0).   Impression and recommendations: Single-vessel proximal LAD high-grade stenosis with large poststenotic dilatation.  Best option would be to consider single-vessel CABG with LIMA to LAD especially in view of chronic thrombocytopenia and potential for long durable results in view of poststenotic aneurysmal dilatation of the proximal LAD.  54 mL contrast utilized.   Coronary Stent Intervention: 04/2023 Staged coronary intervention 05/04/2023: OCT guided PTCA and stenting of the  ostial/proximal LAD with implantation of a 4.0 x 12 mm Synergy XD DES postdilated with a 4.5 x 8 mm emerge [place], stenosis reduced from 90% to 0% with TIMI-3 to TIMI-3 flow. Minimal luminal area around 2-2.5 preprocedure increase to 11 mm, well opposed stent struts from the ostium to proximal to aneurysmal dilatation.  No free-floating stent struts visualized.    Recommendation: Gentle hydration overnight, follow-up serum creatinine tomorrow, if stable can be discharged home maybe tomorrow afternoon or day after if stable with outpatient follow-up and follow-up of serum creatinine in a week to 2 weeks.    Risk Assessment/Calculations:    CHA2DS2-VASc Score = 5   This indicates a 7.2% annual risk of stroke. The patient's score is based upon: CHF History: 1 HTN History: 1 Diabetes History: 0 Stroke History: 0 Vascular Disease History: 1 Age Score: 2 Gender Score: 0   Physical Exam:   VS:  BP 104/60   Pulse 60   Ht 6\' 2"  (1.88 m)   Wt 226 lb 6.4 oz (102.7 kg)   SpO2 96%   BMI 29.07 kg/m    Wt Readings from Last 3 Encounters:  05/09/23 226 lb 6.4 oz (102.7 kg)  05/01/23 225 lb 9.8 oz (102.3 kg)  01/18/23 229 lb 6.4 oz (104.1 kg)     GEN: Well nourished, well developed male appearing  in no acute distress NECK: No JVD; No carotid bruits CARDIAC: Irregularly irregular, no murmurs, rubs, gallops RESPIRATORY:  Clear to auscultation without rales, wheezing or rhonchi  ABDOMEN: Appears non-distended. No obvious abdominal masses. EXTREMITIES: No clubbing or cyanosis. No pitting edema.  Distal pedal pulses are 2+ bilaterally. Ecchymosis along right arm but no evidence of a hematoma.    Assessment and Plan:   1. CAD/Subsequent Episode of Care for NSTEMI - He was recently admitted for an NSTEMI and underwent DES of the LAD as described above. He denies any recent chest pain. Does report shortness of breath as discussed above above and unclear if this is due to Brilinta or CHF. Will  check a BNP.  I encouraged him to make [place] aware if shortness of breath does not improve within the next few weeks as we may need to switch from Brilinta to Plavix with a loading dose.  Continue Atorvastatin 80 mg daily, Coreg 12.5 mg twice daily and Imdur 60 mg daily.  2. Chronic HFrEF - His ejection fraction was reduced at 25 to 30% during his admission. He does report some shortness of breath and is unclear if this is due to his heart failure or Brilinta. I encouraged him to try taking Brilinta with caffeine. Will recheck a BNP today with labs but he does not appear volume overloaded by examination today. - Continue current medical therapy with Coreg 12.5 mg twice daily, Hydralazine 25 mg 3 times daily and Imdur 60 mg daily. Unable to further titrate given his BP at 104/60. He has not been on an ACE-I/ARB/ARNI/MRA/SGLT2 inhibitor given his renal function. Will recheck a limited echo in 2-3 months for reassessment of his EF.   3. HTN - Blood pressure is well-controlled at 104/60 during today's visit. Continue current medical therapy with Coreg 12.5 mg twice daily, Hydralazine 25 mg 3 times daily and Imdur 60 mg daily.  4. HLD - LDL was at 102 when checked during his recent admission and he was started on Atorvastatin 80 mg daily. Will recheck an FLP and LFT's in 2 months.   5. Permanent Atrial Fibrillation - Denies any recent palpitations and heart rate is well-controlled in the 60's during today's visit. Continue Coreg 12.5 mg twice daily for rate-control. - He has not been on anticoagulation given prior subdural hematoma and chronic thrombocytopenia. He was evaluated by Dr. Jimmey Ralph during admission with Watchman device placement briefly discussed at that time.  By review of notes, they are planning to arrange a consult in 05/2023.  6. Stage 4 CKD - Followed by Dr. Arrie Aran. Creatinine peaked at 3.62 during his recent admission and had improved to 3.35 at the time of hospital discharge. Will  recheck a BMET.  7. Ascending aortic aneurysm - He had aortic dilatation measuring 44 mm by recent echocardiogram earlier this month. Will continue to follow.  Cardiac Rehabilitation Eligibility Assessment  The patient is ready to start cardiac rehabilitation from a cardiac standpoint.      Signed, Ellsworth Lennox, PA-C

## 2023-05-09 NOTE — Telephone Encounter (Signed)
Called patient to schedule echo. No answer.  Left voicemail to call [phone]

## 2023-05-09 NOTE — Telephone Encounter (Signed)
Left message to call back  

## 2023-05-10 ENCOUNTER — Other Ambulatory Visit (HOSPITAL_COMMUNITY): Payer: Self-pay

## 2023-05-10 ENCOUNTER — Telehealth (HOSPITAL_COMMUNITY): Payer: Self-pay

## 2023-05-10 NOTE — Progress Notes (Signed)
Specialty Pharmacy Refill Coordination Note  Andrew [name] is a 82 y.o. male contacted today regarding refills of specialty medication(s) Venetoclax [name])   Patient requested Delivery   Delivery date: 05/22/23   Verified address: [name]  [place] [address]   Medication will be filled on 05/21/23.

## 2023-05-10 NOTE — Progress Notes (Signed)
Clinical Intervention Note  Clinical Intervention Notes: Patient's new medications: Brilinta, carvedilol, and atrovastatin are all P-GP INHIBITORS, which increase Venclexta exposure, side effects, and the potential for Tumor Lysis Syndrome. Patient was counseled on increased risk, side effects, and signs of Tumor Lysis syndrome. I messaged Dr. Ellin Saba via secure chat, as well to determine if any changes to therapy are warranted.   Clinical Intervention Outcomes: Prevention of an adverse drug event   Eulah Citizen

## 2023-05-10 NOTE — Progress Notes (Signed)
Specialty Pharmacy Ongoing Clinical Assessment Note  Andrew [name] is a 82 y.o. male who is being followed by the specialty pharmacy service for RxSp Oncology   Patient's specialty medication(s) reviewed today: Venetoclax (VENCLEXTA)   Missed doses in the last 4 weeks: 0   Patient/Caregiver did not have any additional questions or concerns.   Therapeutic benefit summary: Patient is achieving benefit   Adverse events/side effects summary: No adverse events/side effects   Patient's therapy is appropriate to: Continue    Goals Addressed             This Visit's Progress    Achieve or maintain remission       Patient is on track. Patient will maintain adherence.  Patient remains stable at this time.          Follow up:  6 months  Servando Snare Specialty Pharmacist

## 2023-05-10 NOTE — Telephone Encounter (Signed)
Attempted to call patient in regards to Cardiac Rehab - LM on VM 

## 2023-05-14 NOTE — Telephone Encounter (Signed)
Left message to call back  

## 2023-05-15 NOTE — Telephone Encounter (Signed)
Attempt #4 to schedule Watchman consult with Dr. Jimmey Ralph. Left message to call back.  Letter sent to the patient to contact the Watchman Team to arrange consult.

## 2023-05-16 ENCOUNTER — Encounter (HOSPITAL_COMMUNITY): Payer: Self-pay

## 2023-05-21 ENCOUNTER — Other Ambulatory Visit: Payer: Self-pay

## 2023-05-24 NOTE — Telephone Encounter (Signed)
The patient called after receiving his letter.  He stated he wishes to see his general cardiologist in March to discuss prior to scheduling formal Watchman consult. He will call after that appointment if he would like to see Dr. Jimmey Ralph.

## 2023-05-29 ENCOUNTER — Inpatient Hospital Stay: Payer: Medicare HMO | Attending: Hematology

## 2023-05-29 DIAGNOSIS — C9110 Chronic lymphocytic leukemia of B-cell type not having achieved remission: Secondary | ICD-10-CM | POA: Insufficient documentation

## 2023-05-29 DIAGNOSIS — D649 Anemia, unspecified: Secondary | ICD-10-CM | POA: Insufficient documentation

## 2023-05-29 DIAGNOSIS — N189 Chronic kidney disease, unspecified: Secondary | ICD-10-CM | POA: Insufficient documentation

## 2023-05-29 DIAGNOSIS — D509 Iron deficiency anemia, unspecified: Secondary | ICD-10-CM

## 2023-05-29 LAB — CBC WITH DIFFERENTIAL/PLATELET
Abs Immature Granulocytes: 0.01 10*3/uL (ref 0.00–0.07)
Basophils Absolute: 0 10*3/uL (ref 0.0–0.1)
Basophils Relative: 0 %
Eosinophils Absolute: 0 10*3/uL (ref 0.0–0.5)
Eosinophils Relative: 0 %
HCT: 33.9 % — ABNORMAL LOW (ref 39.0–52.0)
Hemoglobin: 11.5 g/dL — ABNORMAL LOW (ref 13.0–17.0)
Immature Granulocytes: 0 %
Lymphocytes Relative: 12 %
Lymphs Abs: 0.4 10*3/uL — ABNORMAL LOW (ref 0.7–4.0)
MCH: 35.4 pg — ABNORMAL HIGH (ref 26.0–34.0)
MCHC: 33.9 g/dL (ref 30.0–36.0)
MCV: 104.3 fL — ABNORMAL HIGH (ref 80.0–100.0)
Monocytes Absolute: 0.6 10*3/uL (ref 0.1–1.0)
Monocytes Relative: 16 %
Neutro Abs: 2.4 10*3/uL (ref 1.7–7.7)
Neutrophils Relative %: 72 %
Platelets: 90 10*3/uL — ABNORMAL LOW (ref 150–400)
RBC: 3.25 MIL/uL — ABNORMAL LOW (ref 4.22–5.81)
RDW: 13.5 % (ref 11.5–15.5)
Smear Review: DECREASED
WBC: 3.4 10*3/uL — ABNORMAL LOW (ref 4.0–10.5)
nRBC: 0 % (ref 0.0–0.2)

## 2023-05-29 LAB — COMPREHENSIVE METABOLIC PANEL
ALT: 19 U/L (ref 0–44)
AST: 19 U/L (ref 15–41)
Albumin: 3.6 g/dL (ref 3.5–5.0)
Alkaline Phosphatase: 73 U/L (ref 38–126)
Anion gap: 7 (ref 5–15)
BUN: 38 mg/dL — ABNORMAL HIGH (ref 8–23)
CO2: 22 mmol/L (ref 22–32)
Calcium: 9 mg/dL (ref 8.9–10.3)
Chloride: 109 mmol/L (ref 98–111)
Creatinine, Ser: 3.16 mg/dL — ABNORMAL HIGH (ref 0.61–1.24)
GFR, Estimated: 19 mL/min — ABNORMAL LOW (ref >60)
Glucose, Bld: 111 mg/dL — ABNORMAL HIGH (ref 70–99)
Potassium: 4.3 mmol/L (ref 3.5–5.1)
Sodium: 138 mmol/L (ref 135–145)
Total Bilirubin: 1 mg/dL (ref 0.0–1.2)
Total Protein: 6.3 g/dL — ABNORMAL LOW (ref 6.5–8.1)

## 2023-05-29 LAB — IRON AND TIBC
Iron: 71 ug/dL (ref 45–182)
Saturation Ratios: 23 % (ref 17.9–39.5)
TIBC: 313 ug/dL (ref 250–450)
UIBC: 242 ug/dL

## 2023-05-29 LAB — FERRITIN: Ferritin: 126 ng/mL (ref 24–336)

## 2023-05-29 LAB — LACTATE DEHYDROGENASE: LDH: 156 U/L (ref 98–192)

## 2023-06-03 ENCOUNTER — Encounter (HOSPITAL_COMMUNITY): Payer: Self-pay | Admitting: Hematology

## 2023-06-03 ENCOUNTER — Telehealth: Payer: Self-pay

## 2023-06-03 ENCOUNTER — Other Ambulatory Visit (HOSPITAL_COMMUNITY): Payer: Self-pay

## 2023-06-03 NOTE — Telephone Encounter (Signed)
 Oral Oncology Patient Advocate Encounter  Was successful in securing patient a $8,000.00 grant from [facility] to provide copayment coverage for Venclexta .  This will keep the out of pocket expense at $0.     [employer] ID: [id number]   The billing information is as follows and has been shared with [facility].    RxBin: [id number] PCN: [id number] Member ID: [id number] Group ID: [id number] Dates of Eligibility: 03/24/23 through 03/22/24  Fund:  Chronic Lymphocytic Leukemia   Morene Potters, CPhT Oncology Pharmacy Patient Advocate  [facility]  [phone] (phone) [phone] (fax)

## 2023-06-04 DIAGNOSIS — E039 Hypothyroidism, unspecified: Secondary | ICD-10-CM | POA: Diagnosis not present

## 2023-06-05 ENCOUNTER — Inpatient Hospital Stay: Payer: Medicare HMO | Attending: Hematology | Admitting: Hematology

## 2023-06-05 VITALS — BP 119/76 | HR 83 | Temp 97.8°F | Resp 17 | Wt 226.6 lb

## 2023-06-05 DIAGNOSIS — C9110 Chronic lymphocytic leukemia of B-cell type not having achieved remission: Secondary | ICD-10-CM | POA: Insufficient documentation

## 2023-06-05 DIAGNOSIS — N184 Chronic kidney disease, stage 4 (severe): Secondary | ICD-10-CM | POA: Diagnosis not present

## 2023-06-05 DIAGNOSIS — Z87891 Personal history of nicotine dependence: Secondary | ICD-10-CM | POA: Insufficient documentation

## 2023-06-05 DIAGNOSIS — D649 Anemia, unspecified: Secondary | ICD-10-CM | POA: Insufficient documentation

## 2023-06-05 DIAGNOSIS — Z79899 Other long term (current) drug therapy: Secondary | ICD-10-CM | POA: Insufficient documentation

## 2023-06-05 DIAGNOSIS — Z9221 Personal history of antineoplastic chemotherapy: Secondary | ICD-10-CM | POA: Insufficient documentation

## 2023-06-05 NOTE — Progress Notes (Signed)
 Patient is taking venetoclax as prescribed.  He has not missed any doses and reports no side effects at this time.

## 2023-06-05 NOTE — Patient Instructions (Addendum)
 [facility] at [facility] Discharge Instructions   You were seen and examined today by Dr. Rogers.  He reviewed the results of your lab work which are normal/stable.   Continue venetoclax  as prescribed.   We will see you back in 4 months. We will repeat lab work prior to this visit.   Return as scheduled.    Thank you for choosing [facility] at [facility] to provide your oncology and hematology care.  To afford each patient quality time with our provider, please arrive at least 15 minutes before your scheduled appointment time.   If you have a lab appointment with the Cancer Center please come in thru the Main Entrance and check in at the main information desk.  You need to re-schedule your appointment should you arrive 10 or more minutes late.  We strive to give you quality time with our providers, and arriving late affects you and other patients whose appointments are after yours.  Also, if you no show three or more times for appointments you may be dismissed from the clinic at the providers discretion.     Again, thank you for choosing [facility].  Our hope is that these requests will decrease the amount of time that you wait before being seen by our physicians.       _____________________________________________________________  Should you have questions after your visit to [facility], please contact our office at [phone] and follow the prompts.  Our office hours are 8:00 a.m. and 4:30 p.m. Monday - Friday.  Please note that voicemails left after 4:00 p.m. may not be returned until the following business day.  We are closed weekends and major holidays.  You do have access to a nurse 24-7, just call the main number to the clinic [phone] and do not press any options, hold on the line and a nurse will answer the phone.    For prescription refill requests, have your pharmacy contact our office and  allow 72 hours.    Due to Covid, you will need to wear a mask upon entering the hospital. If you do not have a mask, a mask will be given to you at the Main Entrance upon arrival. For doctor visits, patients may have 1 support person age 36 or older with them. For treatment visits, patients can not have anyone with them due to social distancing guidelines and our immunocompromised population.

## 2023-06-05 NOTE — Progress Notes (Signed)
 [facility] [address] [place] [address]    Clinic Day:  06/05/23   Referring physician: Shona Norleen PEDLAR, Andrew [name]  Patient Care Team: Shona Norleen PEDLAR, Andrew [name] as PCP - General (Internal Medicine) Alvan Dorn FALCON, Andrew [name] as PCP - Cardiology (Cardiology) Cindie Carlin POUR, DO as Consulting Physician (Internal Medicine) [name] Hai, Andrew [name] as Consulting Physician (Hematology)   ASSESSMENT & PLAN:   Assessment: 1.  CLL, Rai stage IV: -BMBX on 08/10/2016 shows bone marrow involvement by CLL. -He also had bulky diffuse adenopathy with progressive thrombocytopenia. -Imbruvica  420 mg from 08/19/2016 through May 2018, discontinued due to subdural hematoma on 10/14/2016. -Subsequently offered chlorambucil  and obinutuzumab.  He refused obinutuzumab.  Chlorambucil  30 mg every 2 weeks from June 2019 through 11/07/2018 due to severe weakness. -PET scan on 12/01/2019 showed multiple bilateral enlarged cervical lymph nodes, bilateral axillary and mediastinal adenopathy, prevascular lymph nodes, left lower paratracheal lymph node, minimal adenopathy in the retroperitoneum, external iliac lymph nodes.  Spleen measures 16 cm.  All lymph nodes measure less than 5 cm with SUV less than 5. 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 -T p53 mutation is pending. -Venetoclax  10 mg started on 12/02/2019 (dose reduced secondary to Coreg ), dose increased to 50 mg daily on 12/12/2019. -Dose increased to 100 mg daily on 12/19/2019, held on 12/25/2019 due to elevated creatinine. -Venetoclax  100 mg daily started back on 01/15/2020.  Dose increased to 200 mg daily on 07/01/2020.  Dose increased to 300 mg daily on 09/29/2020. - Rituximab  6 cycles completed on 07/29/2020. - CT CAP on 12/16/2020 showed complete resolution of lymphadenopathy in the chest.  Spleen size is normal.  Subcentimeter lymphadenopathy in the left external iliac region and left inguinal region.   2.  CKD: -Kidney biopsy on 12/19/2017  shows CLL involvement with diffuse severe interstitial nephritis and associated tubular centric granulomatous reaction.   Plan: 1.  CLL, Rai stage IV: - He is tolerating venetoclax  very well. - Physical exam: No palpable adenopathy or splenomegaly. - He recently had an NSTEMI in December and is currently on Brilinta , aspirin , Lipitor , Imdur . - Reviewed labs today: LDH was normal.  White count is 3.4 with normal ANC.  Platelet count is stable at 90. - I have checked into drug interactions with venetoclax  and did not see any.  Continue venetoclax  300 mg daily.  Recommend follow-up in 4 months with repeat labs.   2.  CKD: - Creatinine is stable at 3.16.  Continue monitoring with Dr. Rayburn.   3.  Normocytic anemia: - Hemoglobin stable at 11.5.  Ferritin is 126 and percent saturation 23. - Continue iron  tablet 3 times weekly.  Orders Placed This Encounter  Procedures   CBC with Differential    Standing Status:   Future    Expected Date:   10/01/2023    Expiration Date:   06/04/2024   Comprehensive metabolic panel    Standing Status:   Future    Expected Date:   10/01/2023    Expiration Date:   06/04/2024   Lactate dehydrogenase    Standing Status:   Future    Expected Date:   10/01/2023    Expiration Date:   06/04/2024      LILLETTE Verneta SAUNDERS Teague,acting as a scribe for Andrew Rogers, Andrew [name].,have documented all relevant documentation on the behalf of Andrew Rogers, Andrew [name],as directed by  Andrew Rogers, Andrew [name] while in the presence of Andrew Rogers, Andrew [name].  I, Andrew Cannedy  Andrew [name], have reviewed the above documentation for accuracy and completeness, and I agree with the above.     Andrew Stands, Andrew [name]   1/7/20255:20 PM  CHIEF COMPLAINT:   Diagnosis: CLL    Cancer Staging  CLL (chronic lymphocytic leukemia) (HCC) Staging form: Chronic Lymphocytic Leukemia / Small Lymphocytic Lymphoma, AJCC 8th Edition - Clinical stage from 09/04/2016: Modified Rai Stage IV (Modified Rai  risk: High, Binet: Stage C, Lymphocytosis: Present, Adenopathy: Present, Organomegaly: Present, Anemia: Absent, Thrombocytopenia: Present) - Signed by Berry Debby RAMAN, PA-C on 09/04/2016    Prior Therapy: none  Current Therapy:  Venetoclax  300 mg daily    HISTORY OF PRESENT ILLNESS:   Oncology History  CLL (chronic lymphocytic leukemia) (HCC)  03/09/2015 Initial Diagnosis   CLL (chronic lymphocytic leukemia) (HCC)   08/03/2016 Imaging   CT neck- 1. Diffuse and bulky lymphadenopathy in keeping with history of CLL. Nodal enlargement has diffusely progressed since April 2016. 2. Chest and abdomen CT reported separately.   08/03/2016 Imaging   CT CAP- 1. Bulky lymphadenopathy in the chest, abdomen, and pelvis as described. 2. Splenomegaly. 3. Coronary artery and thoracoabdominal aortic atherosclerosis. 4. Prostatomegaly. 5. Several tiny lucent lesions in the bony anatomic pelvis, nonspecific, but attention on follow-up imaging recommended.   08/10/2016 Procedure   Bone marrow aspiration and biopsy by IR.   08/11/2016 Pathology Results   Bone Marrow, Aspirate,Biopsy, and Clot, left iliac crest - MARKED INVOLVEMENT BY CHRONIC LYMPHOCYTIC LEUKEMIA/SMALL LYMPHOCYTIC LYMPHOMA. PERIPHERAL BLOOD: - CHRONIC LYMPHOCYTIC LEUKEMIA. - THROMBOCYTOPENIA.   08/11/2016 Pathology Results   Bone Marrow Flow Cytometry - CHRONIC LYMPHOCYTIC LEUKEMIA/SMALL LYMPHOCYTIC LYMPHOMA.   08/16/2016 Pathology Results   Normal cytogenetics.  Molecular cytogenetic analysis by FISH demonstrates a 13q-   08/19/2016 -  Chemotherapy   Imbruvica  420 mg daily    03/11/2020 - 07/29/2020 Chemotherapy   Patient is on Treatment Plan : CLL Rituximab  q28d        INTERVAL HISTORY:   Andrew [name] is a 83 y.o. male presenting to clinic today for follow up of CLL. He was last seen by me on 01/18/23.  Since his last visit, he was admitted to the hospital from 04/29/23 to 05/05/23 for an NSTEMI and underwent a left heart catheter  and coronary angiography on 12/3 with severe single-vessel proximal LAD stenosis of 99% with large poststenotic dilatation. He also had a coronary stent intervention (PCI to LAD) on 12/6.   Today, he states that he is doing well overall. His appetite level is at 100%. His energy level is at 40%. He has improved his diet and increased exercise since his last visit. He denies any side effects from Venetoclax . He previously had headaches with Venetoclax , but they resolved when he began taking the medication at noon instead of the morning. He reports new medications after his hospital visit of Lipitor  80 mg OD, Imdur  60 mg OD, Brilinta  90 mg BID, and aspirin  81 mg OD. He had no prior history of MI's before hospitalization. He denies any bruises since starting Brilinta .   PAST MEDICAL HISTORY:   Past Medical History: Past Medical History:  Diagnosis Date   Arthritis    Ascending aortic aneurysm (HCC)    CAD (coronary artery disease)    a. s/p NSTEMI in 04/2023 with DES to LAD   CHF (congestive heart failure) (HCC)    cardiac catheterization in 2007 had revealed LVEF of 35% with no significant coronary disease   CKD (chronic kidney disease), stage IV (HCC)  followed by [facility]   CLL (chronic lymphocytic leukemia) (HCC)    Complication of anesthesia    wild when I woke up    History of kidney stones    passed    Hypertension    PAF (paroxysmal atrial fibrillation) (HCC)    Pinched nerve in neck    Pneumonia    SDH (subdural hematoma) (HCC)    Thrombocytopenia (HCC)     Surgical History: Past Surgical History:  Procedure Laterality Date   AV FISTULA PLACEMENT Left 04/30/2020   Procedure: LEFT ARM BRACHIOCEPHALIC ARTERIOVENOUS (AV) FISTULA CREATION;  Surgeon: Serene Gaile ORN, Andrew [name];  Location: [facility] OR;  Service: Vascular;  Laterality: Left;   BIOPSY  08/05/2018   Procedure: BIOPSY;  Surgeon: Harvey Margo CROME, Andrew [name];  Location: [facility] END[facility] SUITE;  Service: Endoscopy;;  gastric    Bone  marrow biospy Bilateral    CATARACT EXTRACTION, BILATERAL     COLONOSCOPY  07/10/2011   Procedure: COLONOSCOPY;  Surgeon: Margo CHRISTELLA Harvey, Andrew [name];  Location: [facility];  Service: Endoscopy;  Laterality: N/A;  10:30 AM   CORONARY IMAGING/OCT N/A 05/04/2023   Procedure: CORONARY IMAGING/OCT;  Surgeon: Ladona Heinz, Andrew [name];  Location: [facility] INVASIVE CV LAB;  Service: Cardiovascular;  Laterality: N/A;   CORONARY PRESSURE/FFR STUDY N/A 05/01/2023   Procedure: CORONARY PRESSURE/FFR STUDY;  Surgeon: Ladona Heinz, Andrew [name];  Location: [facility] INVASIVE CV LAB;  Service: Cardiovascular;  Laterality: N/A;   CORONARY STENT INTERVENTION N/A 05/04/2023   Procedure: CORONARY STENT INTERVENTION;  Surgeon: Ladona Heinz, Andrew [name];  Location: [facility] INVASIVE CV LAB;  Service: Cardiovascular;  Laterality: N/A;   CRANIOTOMY N/A 10/14/2016   Procedure: CRANIOTOMY HEMATOMA EVACUATION SUBDURAL;  Surgeon: Alix Charleston, Andrew [name];  Location: [facility] OR;  Service: Neurosurgery;  Laterality: N/A;  CRANIOTOMY HEMATOMA EVACUATION SUBDURAL   ESOPHAGOGASTRODUODENOSCOPY N/A 08/05/2018   Procedure: ESOPHAGOGASTRODUODENOSCOPY (EGD);  Surgeon: Harvey Margo CROME, Andrew [name];  [facility]: [facility];  Service: Endoscopy;  Laterality: N/A;  3:00pm   LEFT HEART CATH AND CORONARY ANGIOGRAPHY N/A 05/01/2023   Procedure: LEFT HEART CATH AND CORONARY ANGIOGRAPHY;  Surgeon: Ladona Heinz, Andrew [name];  Location: [facility] INVASIVE CV LAB;  Service: Cardiovascular;  Laterality: N/A;   THYROIDECTOMY, PARTIAL     TONSILLECTOMY      Social History: Social History   Socioeconomic History   Marital status: Married    Spouse name: Not on file   Number of children: Not on file   Years of education: Not on file   Highest education level: Not on file  Occupational History   Not on file  Tobacco Use   Smoking status: Former    Current packs/day: 0.00    Average packs/day: 1 pack/day for 15.0 years (15.0 ttl pk-yrs)    Types: Cigarettes    Start date: 01/26/1956    Quit date: 01/26/1971    Years since quitting:  52.3   Smokeless tobacco: Never  Vaping Use   Vaping status: Never Used  Substance and Sexual Activity   Alcohol use: No   Drug use: No   Sexual activity: Not on file  Other Topics Concern   Not on file  Social History Narrative   Not on file   Social Drivers of Health   Financial Resource Strain: Low Risk  (08/30/2020)   Overall Financial Resource Strain (CARDIA)    Difficulty of Paying Living Expenses: Not hard at all  Food Insecurity: No Food Insecurity (04/29/2023)   Hunger Vital Sign    Worried About Running Out of Food  in the Last Year: Never true    Ran Out of Food in the Last Year: Never true  Transportation Needs: No Transportation Needs (04/29/2023)   PRAPARE - Administrator, Civil Service (Medical): No    Lack of Transportation (Non-Medical): No  Physical Activity: Inactive (08/30/2020)   Exercise Vital Sign    Days of Exercise per Week: 0 days    Minutes of Exercise per Session: 0 min  Stress: No Stress Concern Present (08/30/2020)   [employer] of Occupational Health - Occupational Stress Questionnaire    Feeling of Stress : Not at all  Social Connections: Moderately Integrated (08/30/2020)   Social Connection and Isolation Panel [NHANES]    Frequency of Communication with Friends and Family: More than three times a week    Frequency of Social Gatherings with Friends and Family: More than three times a week    Attends Religious Services: More than 4 times per year    Active Member of [employer] or Organizations: No    Attends Banker Meetings: Never    Marital Status: Married  Catering Manager Violence: Not At Risk (04/29/2023)   Humiliation, Afraid, Rape, and Kick questionnaire    Fear of Current or Ex-Partner: No    Emotionally Abused: No    Physically Abused: No    Sexually Abused: No    Family History: Family History  Problem Relation Age of Onset   CAD Father 20   Cancer Mother    Alzheimer's disease Mother    Colon cancer Neg Hx      Current Medications:  Current Outpatient Medications:    acetaminophen  (TYLENOL ) 325 MG tablet, Take 650 mg by mouth every 6 (six) hours as needed for mild pain (pain score 1-3)., Disp: , Rfl:    aspirin  EC 81 MG tablet, Take 1 tablet (81 mg total) by mouth daily. Swallow whole., Disp: 30 tablet, Rfl: 12   atorvastatin  (LIPITOR ) 80 MG tablet, Take 1 tablet (80 mg total) by mouth daily., Disp: 90 tablet, Rfl: 0   carvedilol  (COREG ) 12.5 MG tablet, Take 1 tablet (12.5 mg total) by mouth 2 (two) times daily with a meal., Disp: 180 tablet, Rfl: 0   Cholecalciferol  (VITAMIN D3) 25 MCG (1000 UT) CAPS, Take 1 capsule by mouth daily., Disp: , Rfl:    Cyanocobalamin  (VITAMIN B12) 1000 MCG TBCR, Take 1,000 mcg by mouth daily. , Disp: , Rfl:    ferrous sulfate  325 (65 FE) MG EC tablet, Take 325 mg by mouth 3 (three) times a week., Disp: , Rfl:    hydrALAZINE  (APRESOLINE ) 25 MG tablet, Take 1 tablet (25 mg total) by mouth every 8 (eight) hours., Disp: 240 tablet, Rfl: 0   isosorbide  mononitrate (IMDUR ) 60 MG 24 hr tablet, Take 1 tablet (60 mg total) by mouth daily., Disp: 90 tablet, Rfl: 0   levothyroxine  (SYNTHROID ) 112 MCG tablet, Take 112 mcg by mouth daily before breakfast., Disp: , Rfl:    Multiple Vitamin (MULITIVITAMIN WITH MINERALS) TABS, Take 1 tablet by mouth daily., Disp: , Rfl:    omeprazole  (PRILOSEC) 20 MG capsule, 1 PO 30 MINS PRIOR TO BREAKFAST. (Patient taking differently: Take 20 mg by mouth daily.), Disp: 90 capsule, Rfl: 3   sodium bicarbonate  650 MG tablet, Take 650 mg by mouth 3 (three) times daily., Disp: , Rfl:    tamsulosin  (FLOMAX ) 0.4 MG CAPS capsule, Take 0.4 mg by mouth daily. , Disp: , Rfl:    ticagrelor  (BRILINTA ) 90 MG TABS  tablet, Take 1 tablet (90 mg total) by mouth 2 (two) times daily., Disp: 240 tablet, Rfl: 0   venetoclax  (VENCLEXTA ) 100 MG tablet, Take 3 tablets (300 mg total) by mouth daily., Disp: 84 tablet, Rfl: 3   Allergies: No Known Allergies  REVIEW OF  SYSTEMS:   Review of Systems  Constitutional:  Negative for chills, fatigue and fever.  HENT:   Negative for lump/mass, mouth sores, nosebleeds, sore throat and trouble swallowing.   Eyes:  Negative for eye problems.  Respiratory:  Positive for cough and shortness of breath.   Cardiovascular:  Negative for chest pain, leg swelling and palpitations.  Gastrointestinal:  Negative for abdominal pain, constipation, diarrhea, nausea and vomiting.  Genitourinary:  Negative for bladder incontinence, difficulty urinating, dysuria, frequency, hematuria and nocturia.   Musculoskeletal:  Negative for arthralgias, back pain, flank pain, myalgias and neck pain.  Skin:  Negative for itching and rash.  Neurological:  Positive for numbness (in toes). Negative for dizziness and headaches.  Hematological:  Does not bruise/bleed easily.  Psychiatric/Behavioral:  Positive for sleep disturbance. Negative for depression and suicidal ideas. The patient is not nervous/anxious.   All other systems reviewed and are negative.    VITALS:   Blood pressure 119/76, pulse 83, temperature 97.8 F (36.6 C), temperature source Oral, resp. rate 17, weight 226 lb 10.1 oz (102.8 kg), SpO2 95%.  Wt Readings from Last 3 Encounters:  06/05/23 226 lb 10.1 oz (102.8 kg)  05/09/23 226 lb 6.4 oz (102.7 kg)  05/01/23 225 lb 9.8 oz (102.3 kg)    Body mass index is 29.1 kg/m.  Performance status (ECOG): 1 - Symptomatic but completely ambulatory  PHYSICAL EXAM:   Physical Exam Vitals and nursing note reviewed. Exam conducted with a chaperone present.  Constitutional:      Appearance: Normal appearance.  Cardiovascular:     Rate and Rhythm: Normal rate and regular rhythm.     Pulses: Normal pulses.     Heart sounds: Normal heart sounds.  Pulmonary:     Effort: Pulmonary effort is normal.     Breath sounds: Normal breath sounds.  Abdominal:     Palpations: Abdomen is soft. There is no hepatomegaly, splenomegaly or mass.      Tenderness: There is no abdominal tenderness.  Musculoskeletal:     Right lower leg: No edema.     Left lower leg: No edema.  Lymphadenopathy:     Cervical: No cervical adenopathy.     Right cervical: No superficial, deep or posterior cervical adenopathy.    Left cervical: No superficial, deep or posterior cervical adenopathy.     Upper Body:     Right upper body: No supraclavicular or axillary adenopathy.     Left upper body: No supraclavicular or axillary adenopathy.  Neurological:     General: No focal deficit present.     Mental Status: He is alert and oriented to person, place, and time.  Psychiatric:        Mood and Affect: Mood normal.        Behavior: Behavior normal.     LABS:      Latest Ref Rng & Units 05/29/2023    8:45 AM 05/05/2023    2:58 AM 05/04/2023    4:08 AM  CBC  WBC 4.0 - 10.5 K/uL 3.4  3.0  2.4   Hemoglobin 13.0 - 17.0 g/dL 88.4  89.1  88.7   Hematocrit 39.0 - 52.0 % 33.9  32.2  32.7  Platelets 150 - 400 K/uL 90  76  78       Latest Ref Rng & Units 05/29/2023    8:45 AM 05/09/2023    1:44 PM 05/05/2023    2:58 AM  CMP  Glucose 70 - 99 mg/dL 888  896  95   BUN 8 - 23 mg/dL 38  44  41   Creatinine 0.61 - 1.24 mg/dL 6.83  6.28  6.64   Sodium 135 - 145 mmol/L 138  137  138   Potassium 3.5 - 5.1 mmol/L 4.3  4.5  4.1   Chloride 98 - 111 mmol/L 109  108  110   CO2 22 - 32 mmol/L 22  20  17    Calcium  8.9 - 10.3 mg/dL 9.0  9.0  8.8   Total Protein 6.5 - 8.1 g/dL 6.3     Total Bilirubin 0.0 - 1.2 mg/dL 1.0     Alkaline Phos 38 - 126 U/L 73     AST 15 - 41 U/L 19     ALT 0 - 44 U/L 19        No results found for: CEA1, CEA / No results found for: CEA1, CEA No results found for: PSA1 No results found for: CAN199 No results found for: RJW874  Lab Results  Component Value Date   TOTALPROTELP 6.1 11/04/2015   ALBUMINELP 4.1 11/04/2015   A1GS 0.2 11/04/2015   A2GS 0.5 11/04/2015   BETS 0.8 11/04/2015   GAMS 0.4 11/04/2015    MSPIKE Not Observed 11/04/2015   SPEI Comment 11/04/2015   Lab Results  Component Value Date   TIBC 313 05/29/2023   TIBC 324 01/09/2023   TIBC 315 08/31/2022   FERRITIN 126 05/29/2023   FERRITIN 150 01/09/2023   FERRITIN 86 08/31/2022   IRONPCTSAT 23 05/29/2023   IRONPCTSAT 26 01/09/2023   IRONPCTSAT 30 08/31/2022   Lab Results  Component Value Date   LDH 156 05/29/2023   LDH 149 01/09/2023   LDH 142 08/31/2022     STUDIES:   No results found.

## 2023-06-07 DIAGNOSIS — C9110 Chronic lymphocytic leukemia of B-cell type not having achieved remission: Secondary | ICD-10-CM | POA: Diagnosis not present

## 2023-06-07 DIAGNOSIS — D696 Thrombocytopenia, unspecified: Secondary | ICD-10-CM | POA: Diagnosis not present

## 2023-06-07 DIAGNOSIS — N184 Chronic kidney disease, stage 4 (severe): Secondary | ICD-10-CM | POA: Diagnosis not present

## 2023-06-07 DIAGNOSIS — Z8616 Personal history of COVID-19: Secondary | ICD-10-CM | POA: Diagnosis not present

## 2023-06-07 DIAGNOSIS — N12 Tubulo-interstitial nephritis, not specified as acute or chronic: Secondary | ICD-10-CM | POA: Diagnosis not present

## 2023-06-07 DIAGNOSIS — I770 Arteriovenous fistula, acquired: Secondary | ICD-10-CM | POA: Diagnosis not present

## 2023-06-07 DIAGNOSIS — N200 Calculus of kidney: Secondary | ICD-10-CM | POA: Diagnosis not present

## 2023-06-07 DIAGNOSIS — I129 Hypertensive chronic kidney disease with stage 1 through stage 4 chronic kidney disease, or unspecified chronic kidney disease: Secondary | ICD-10-CM | POA: Diagnosis not present

## 2023-06-07 DIAGNOSIS — N179 Acute kidney failure, unspecified: Secondary | ICD-10-CM | POA: Diagnosis not present

## 2023-06-07 DIAGNOSIS — I5020 Unspecified systolic (congestive) heart failure: Secondary | ICD-10-CM | POA: Diagnosis not present

## 2023-06-12 ENCOUNTER — Telehealth: Payer: Self-pay | Admitting: Student

## 2023-06-12 DIAGNOSIS — I110 Hypertensive heart disease with heart failure: Secondary | ICD-10-CM | POA: Diagnosis not present

## 2023-06-12 DIAGNOSIS — R0600 Dyspnea, unspecified: Secondary | ICD-10-CM | POA: Diagnosis not present

## 2023-06-12 DIAGNOSIS — I214 Non-ST elevation (NSTEMI) myocardial infarction: Secondary | ICD-10-CM | POA: Diagnosis not present

## 2023-06-12 DIAGNOSIS — Z9089 Acquired absence of other organs: Secondary | ICD-10-CM | POA: Diagnosis not present

## 2023-06-12 DIAGNOSIS — I5022 Chronic systolic (congestive) heart failure: Secondary | ICD-10-CM | POA: Diagnosis not present

## 2023-06-12 DIAGNOSIS — E039 Hypothyroidism, unspecified: Secondary | ICD-10-CM | POA: Diagnosis not present

## 2023-06-12 DIAGNOSIS — I480 Paroxysmal atrial fibrillation: Secondary | ICD-10-CM | POA: Diagnosis not present

## 2023-06-12 DIAGNOSIS — C9110 Chronic lymphocytic leukemia of B-cell type not having achieved remission: Secondary | ICD-10-CM | POA: Diagnosis not present

## 2023-06-12 DIAGNOSIS — Z23 Encounter for immunization: Secondary | ICD-10-CM | POA: Diagnosis not present

## 2023-06-12 DIAGNOSIS — E890 Postprocedural hypothyroidism: Secondary | ICD-10-CM | POA: Diagnosis not present

## 2023-06-12 NOTE — Telephone Encounter (Signed)
   Did he try taking Brilinta  with caffeine ? If he did and symptoms have persisted, can stop Brilinta  and switch to Plavix . He would need to take 300 mg of Plavix  (4 of the 75mg  tablets) on the day of switching followed by Plavix  75mg  daily.   Signed, Laymon CHRISTELLA Qua, PA-C 06/12/2023, 12:24 PM Pager: [phone]

## 2023-06-12 NOTE — Telephone Encounter (Signed)
 Pt came in office and is experiencing SOB, his PCP said it could be the Brilinta he was prescribed in the hospital but to check with you about possibly changing it. [phone] is the best number to reach the pt.

## 2023-06-12 NOTE — Telephone Encounter (Signed)
 I spoke with patient and he wants to try drinking coffee before taking Brilinta. He will call back with update.

## 2023-06-13 ENCOUNTER — Other Ambulatory Visit: Payer: Self-pay

## 2023-06-13 NOTE — Progress Notes (Signed)
 Specialty Pharmacy Refill Coordination Note  Andrew [name] is a 83 y.o. male contacted today regarding refills of specialty medication(s) Venetoclax  (VENCLEXTA )   Patient requested Delivery   Delivery date: 06/22/23   Verified address: [name]  [place] [address]   Medication will be filled on 01.23.25.

## 2023-06-19 NOTE — Progress Notes (Signed)
Acute systolic heart failure was present on admission.   Gerri Spore T. Flora Lipps, MD, [facility]  [facility]  [address] [place], [place] [address] [phone]  5:25 PM

## 2023-06-21 ENCOUNTER — Other Ambulatory Visit: Payer: Self-pay

## 2023-06-25 ENCOUNTER — Telehealth: Payer: Self-pay

## 2023-06-25 MED ORDER — CLOPIDOGREL BISULFATE 75 MG PO TABS: 75.0000 mg | ORAL_TABLET | ORAL | 3 refills | Status: DC

## 2023-06-25 NOTE — Telephone Encounter (Signed)
Ellsworth Lennox, PA-C to [employer] Triage     06/25/23  4:20 PM Can switch to Plavix as previously recommended in phone notes with loading dose as discussed above. Ellsworth Lennox, PA-C to [facility]OMA      06/25/23  4:20 PM Good afternoon,    If you are still having symptoms, I would recommend stopping Brilinta and switching to Plavix. You would take 300 mg of Plavix (4 of the 75mg  tablets) on the day of switching followed by Plavix 75mg  daily. We will send this to your pharmacy. Please continue on Brilinta until you pick this up.    Best,  [place]      06/25/23 4 :57 pm  Patient agrees to stop brilinta and begin plavix 75 mg daily after initial loading dose of 300 mg    Of note,pt is taking prilosec, I will message provider

## 2023-06-26 MED ORDER — PANTOPRAZOLE SODIUM 20 MG PO TBEC: 20.0000 mg | DELAYED_RELEASE_TABLET | Freq: Every day | ORAL | 3 refills | Status: DC

## 2023-06-26 NOTE — Telephone Encounter (Signed)
Call pt to notify of med change. No answer, left msg to call back.

## 2023-06-26 NOTE — Telephone Encounter (Signed)
Pt notified of medication change.

## 2023-06-27 ENCOUNTER — Telehealth (HOSPITAL_COMMUNITY): Payer: Self-pay

## 2023-06-27 NOTE — Telephone Encounter (Signed)
No response from in regards to Cardiac rehab.   Closed referral

## 2023-07-04 ENCOUNTER — Encounter (INDEPENDENT_AMBULATORY_CARE_PROVIDER_SITE_OTHER): Payer: Self-pay | Admitting: *Deleted

## 2023-07-16 ENCOUNTER — Ambulatory Visit (HOSPITAL_BASED_OUTPATIENT_CLINIC_OR_DEPARTMENT_OTHER): Payer: Medicare HMO

## 2023-07-16 DIAGNOSIS — I2510 Atherosclerotic heart disease of native coronary artery without angina pectoris: Secondary | ICD-10-CM

## 2023-07-16 DIAGNOSIS — I5022 Chronic systolic (congestive) heart failure: Secondary | ICD-10-CM

## 2023-07-16 LAB — ECHOCARDIOGRAM LIMITED
AR max vel: 1.63 cm2
AV Area VTI: 1.53 cm2
AV Area mean vel: 1.63 cm2
AV Mean grad: 6 mm[Hg]
AV Peak grad: 11.6 mm[Hg]
Ao pk vel: 1.7 m/s
Area-P 1/2: 3.77 cm2
P 1/2 time: 774 ms
S' Lateral: 3.64 cm

## 2023-07-17 ENCOUNTER — Other Ambulatory Visit: Payer: Self-pay

## 2023-07-17 NOTE — Progress Notes (Signed)
Specialty Pharmacy Refill Coordination Note  Andrew [name] is a 83 y.o. male contacted today regarding refills of specialty medication(s) Venetoclax [name])   Patient requested Delivery   Delivery date: 07/27/23   Verified address: [name]  [place] [address]   Medication will be filled on 07/26/23.

## 2023-07-26 ENCOUNTER — Other Ambulatory Visit: Payer: Self-pay

## 2023-07-31 ENCOUNTER — Encounter: Payer: Self-pay | Admitting: Internal Medicine

## 2023-08-07 ENCOUNTER — Ambulatory Visit: Payer: Medicare HMO | Attending: Student | Admitting: Student

## 2023-08-07 ENCOUNTER — Encounter: Payer: Self-pay | Admitting: Student

## 2023-08-07 ENCOUNTER — Other Ambulatory Visit (HOSPITAL_COMMUNITY)
Admission: RE | Admit: 2023-08-07 | Discharge: 2023-08-07 | Disposition: A | Discharge status: Home / Self Care | Source: Ambulatory Visit | Attending: Student | Admitting: Student

## 2023-08-07 VITALS — BP 110/60 | HR 70 | Ht 74.0 in | Wt 233.0 lb

## 2023-08-07 DIAGNOSIS — R0609 Other forms of dyspnea: Secondary | ICD-10-CM | POA: Diagnosis not present

## 2023-08-07 DIAGNOSIS — N184 Chronic kidney disease, stage 4 (severe): Secondary | ICD-10-CM | POA: Diagnosis not present

## 2023-08-07 DIAGNOSIS — I4821 Permanent atrial fibrillation: Secondary | ICD-10-CM | POA: Diagnosis not present

## 2023-08-07 DIAGNOSIS — I77810 Thoracic aortic ectasia: Secondary | ICD-10-CM | POA: Diagnosis not present

## 2023-08-07 DIAGNOSIS — I2510 Atherosclerotic heart disease of native coronary artery without angina pectoris: Secondary | ICD-10-CM

## 2023-08-07 DIAGNOSIS — I5032 Chronic diastolic (congestive) heart failure: Secondary | ICD-10-CM | POA: Diagnosis not present

## 2023-08-07 DIAGNOSIS — I10 Essential (primary) hypertension: Secondary | ICD-10-CM

## 2023-08-07 DIAGNOSIS — C9110 Chronic lymphocytic leukemia of B-cell type not having achieved remission: Secondary | ICD-10-CM | POA: Diagnosis not present

## 2023-08-07 DIAGNOSIS — E785 Hyperlipidemia, unspecified: Secondary | ICD-10-CM

## 2023-08-07 LAB — CBC
HCT: 32.6 % — ABNORMAL LOW (ref 39.0–52.0)
Hemoglobin: 10.7 g/dL — ABNORMAL LOW (ref 13.0–17.0)
MCH: 34 pg (ref 26.0–34.0)
MCHC: 32.8 g/dL (ref 30.0–36.0)
MCV: 103.5 fL — ABNORMAL HIGH (ref 80.0–100.0)
Platelets: 98 10*3/uL — ABNORMAL LOW (ref 150–400)
RBC: 3.15 MIL/uL — ABNORMAL LOW (ref 4.22–5.81)
RDW: 13.7 % (ref 11.5–15.5)
WBC: 2.4 10*3/uL — ABNORMAL LOW (ref 4.0–10.5)
nRBC: 0 % (ref 0.0–0.2)

## 2023-08-07 LAB — BRAIN NATRIURETIC PEPTIDE: B Natriuretic Peptide: 419 pg/mL — ABNORMAL HIGH (ref 0.0–100.0)

## 2023-08-07 LAB — D-DIMER, QUANTITATIVE: D-Dimer, Quant: 0.42 ug{FEU}/mL (ref 0.00–0.50)

## 2023-08-07 MED ORDER — CARVEDILOL 12.5 MG PO TABS: 12.5000 mg | ORAL_TABLET | Freq: Two times a day (BID) | ORAL | 3 refills | Status: AC

## 2023-08-07 NOTE — Progress Notes (Signed)
 Cardiology Office Note    Date:  08/07/2023  ID:  Andrew [name], DOB 1941-01-06, MRN [id number] Cardiologist: Dina Rich, MD    History of Present Illness:    Andrew [name] is a 83 y.o. 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  He was last examined by myself in 04/2023 following his recent hospitalization for his NSTEMI and underwent PCI as discussed above. He did report some shortness of breath at the time of his office visit and was unsure if this was related to taking his medications or at different times. It was recommended if shortness of breath did not improve, could switch from Brilinta to Plavix with a loading dose. For his cardiomyopathy, he was continued on Coreg, Hydralazine and Imdur and additional GDMT was limited given his CKD. A follow-up echocardiogram was recommended in 2 to 3 months to reassess his EF.  He was switched from Brilinta to Plavix in 05/2023 with a loading dose. Follow-up limited echocardiogram in 06/2023 showed his EF had significantly improved to 50 to 55% with no regional wall motion abnormalities. He did have mild MR, mild AI and mild dilatation of the ascending aorta at 43 mm and aortic root at 43 mm as well.  In talking with the patient and his wife today, he reports still having intermittent dyspnea which occurs with minimal activity such as walking from room to room in his home. However, he can walk for exercise for up  to a mile without significant symptoms. No specific orthopnea, PND or pitting edema. No recent chest pain or palpitations. Reports his dyspnea did improve with switching from Brilinta to Plavix but is still very noticeable. Reports good compliance with his current medications.  Studies Reviewed:   EKG: EKG is not ordered today.  Cardiac Catheterization: 04/2023 Left Heart Catheterization 05/01/23: Hemodynamic data: LV 100/-1, EDP 6 mmHg.  Ao 97/67, mean 78 mmHg.  There was no pressure gradient across the aortic valve.   Angiographic data: RCA: Very large caliber vessel giving origin to large PDA that arises proximally at the crux and a smaller secondary PDA distally and a moderate to large PL branch.  And PL branch.  Mid RCA at the RV branch has a focal 50 to 60% stenosis, IFR negative for significance (1.0). LM: Large-caliber vessel, smooth and normal. LAD: Diffusely diseased in the ostium and proximal segment, there is a focal 99% stenosis followed by poststenotic dilatation focal up to the origin of D1 which is small to moderate-sized.  Large D2 with secondary branch.  Rest of the LAD has minimal disease. LCx: Very large caliber vessel giving origin to tiny OM1 large OM 2 and continues as a large OM 3.  Mid segment of LCx has a 50% stenosis, not hemodynamically significant (iFR 1.0) and OM2 has a mid 50% stenosis, not hemodynamically significant (iFR 1.0).   Impression and recommendations: Single-vessel proximal LAD high-grade stenosis with large poststenotic dilatation.  Best option would be to consider single-vessel CABG with LIMA to  LAD especially in view of chronic thrombocytopenia and potential for long durable results in view of poststenotic aneurysmal dilatation of the proximal LAD.  54 mL contrast utilized.   Coronary Stent Intervention: 04/2023 Staged coronary intervention 05/04/2023: OCT guided PTCA and stenting of the ostial/proximal LAD with implantation of a 4.0 x 12 mm Synergy  XD DES postdilated with a 4.5 x 8 mm emerge [place], stenosis reduced from 90% to 0% with TIMI-3 to TIMI-3 flow. Minimal luminal area around 2-2.5 preprocedure increase to 11 mm, well opposed stent struts from the ostium to proximal to aneurysmal dilatation.  No free-floating stent struts visualized.   Recommendation: Gentle hydration overnight, follow-up serum creatinine tomorrow, if stable can be discharged home maybe tomorrow afternoon or day after if stable with outpatient follow-up and follow-up of serum creatinine in a week to 2 weeks.   125 mL contrast utilized.  Limited Echo: 06/2023 IMPRESSIONS     1. Left ventricular ejection fraction, by estimation, is 50 to 55%. Left  ventricular ejection fraction by 3D volume is 56 %. The left ventricle has  low normal function. The left ventricle has no regional wall motion  abnormalities. The average left  ventricular global longitudinal strain is -14.8 %. The global longitudinal  strain is abnormal.   2. Left atrial size was severely dilated.   3. Right atrial size was severely dilated.   4. The mitral valve is normal in structure. Mild mitral valve  regurgitation.   5. Tricuspid valve regurgitation is mild to moderate.   6. The aortic valve is tricuspid. Aortic valve regurgitation is mild. No  aortic stenosis is present.   7. Aortic dilatation noted. There is mild dilatation of the ascending  aorta, measuring 43 mm. There is mild dilatation of the aortic root,  measuring 43 mm.   8. Tricuspid regurgitation signal is inadequate for assessing PA  pressure.   Comparison(s): Prior images reviewed side by side. The left ventricular  function has improved. The left ventricular wall motion abnormalities are  improved.   Risk Assessment/Calculations:    CHA2DS2-VASc Score = 5   This indicates a 7.2% annual risk of stroke. The patient's score is based upon: CHF History: 1 HTN History: 1 Diabetes History: 0 Stroke History: 0 Vascular  Disease History: 1 Age Score: 2 Gender Score: 0    Physical Exam:   VS:  BP 110/60 (BP Location: Right Arm, Patient Position: Sitting, Cuff Size: Large)   Pulse 70   Ht 6\' 2"  (1.88 m)   Wt 233 lb (105.7 kg)   SpO2 95%   BMI 29.92 kg/m    Wt Readings from Last 3 Encounters:  08/07/23 233 lb (105.7 kg)  06/05/23 226 lb 10.1 oz (102.8 kg)  05/09/23 226 lb 6.4 oz (102.7 kg)     GEN: Well nourished, well developed male appearing in no acute distress NECK: No JVD; No carotid bruits CARDIAC: Irregular irregular, no murmurs, rubs, gallops RESPIRATORY:  Clear to auscultation without rales, wheezing or rhonchi  ABDOMEN: Appears non-distended. No obvious abdominal masses. EXTREMITIES: No clubbing or cyanosis. No pitting edema.  Distal pedal pulses are 2+ bilaterally.   Assessment and Plan:   1. CAD - He recently underwent DES placement to the LAD in 04/2023 as discussed above. Continues to have shortness of breath which did improve with switching Brilinta to Plavix but is still occurring. Interestingly enough, he reports this is most notable with shorter distances and improves with walking for longer distances at a time.  Would not anticipate an ischemic etiology of his symptoms given that recent echo showed his EF had significantly improved following stent placement. Could consider a Lexiscan Myoview for reassessment in the future. Will check follow-up labs to include a CBC, BNP and D-dimer. If D-dimer is elevated, would need to pursue a VQ scan as he would not be a candidate for CTA given his CKD. If reassuring, may need to focus on increasing his activity as he was previously walking a more significant amount prior to stent placement but has reduced this over the past few months and deconditioning could be playing a role.  - Continue current medical therapy with ASA 81 mg daily, Plavix 75 mg daily, Atorvastatin 80 mg daily, Imdur 60 mg daily and Coreg 12.5 mg twice daily.  2. Chronic  HFimpEF - His ejection fraction was previously at 25 to 30% in 04/2023 and had significantly improved to 50 to 55% by recent echocardiogram last month. Continue Coreg 12.5 mg twice daily, Hydralazine 25 mg 3 times daily and Imdur 60 mg daily. Has not been on an ACE-I/ARB/ARNI/MRA/SGLT2 inhibitor due to his stage IV CKD.  3. Permanent Atrial Fibrillation - Denies any recent palpitations and heart rate is well-controlled in the 70's today. Remains on Coreg 12.5 mg twice daily for rate control. - He has not been on anticoagulation given prior subdural hematoma and chronic thrombocytopenia. Will check a D-dimer as discussed above as I am concerned about a possible PE given his symptoms, permanent atrial fibrillation and inability to tolerate anticoagulation. Can possibly refer for Watchman device placement in the future but unsure he will be able to tolerate anticoagulation short-term given his thrombocytopenia. Recheck CBC today.  4. HTN - BP is well-controlled at 110/60 during today's visit. Continue current medical therapy with Coreg 12.5 mg twice daily, Hydralazine 25 mg 3 times daily and Imdur 60 mg daily.  5. HLD - LDL was at 102 when checked in 04/2023 and he has been on Atorvastatin 80 mg daily. Would plan for a follow-up FLP at the time of his next visit if not checked by his PCP in the interim.  6. Stage 4 CKD - Creatinine was at 3.16 when checked in 04/2023 which is close to his known baseline. Followed by Dr. Arrie Aran.   7. CLL - Followed by Oncology and remains on Venetoclax.   8. Dilatation of Ascending Aorta - Recent echocardiogram last month showed mild dilatation of the ascending aorta and aortic root at 43 mm. Would plan for repeat imaging within 1 year for reassessment.    Signed, Ellsworth Lennox, PA-C

## 2023-08-07 NOTE — Patient Instructions (Addendum)
 Medication Instructions:  Your physician recommends that you continue on your current medications as directed. Please refer to the Current Medication list given to you today.  Labwork: BNP, CBC & D-Dimer today at [facility] Non-fasting  Testing/Procedures: none  Follow-Up: Your physician recommends that you schedule a follow-up appointment in: 3-4 months  Any Other Special Instructions Will Be Listed Below (If Applicable).  If you need a refill on your cardiac medications before your next appointment, please call your pharmacy.

## 2023-08-15 ENCOUNTER — Telehealth: Payer: Self-pay | Admitting: *Deleted

## 2023-08-15 DIAGNOSIS — Z79899 Other long term (current) drug therapy: Secondary | ICD-10-CM

## 2023-08-15 MED ORDER — FUROSEMIDE 20 MG PO TABS: 20.0000 mg | ORAL_TABLET | Freq: Every day | ORAL | 3 refills | Status: AC

## 2023-08-15 NOTE — Telephone Encounter (Signed)
-----   Message from Ellsworth Lennox sent at 08/13/2023  7:38 PM EDT ----- Please let the patient know I heard back from Dr. Arrie Aran and he was in agreement with trying a diuretic (fluid pill) to see if that helps with his symptoms. He recommended trying Lasix 20mg  daily. Would recheck a BMET in 7-10 days for reassessment of electrolytes and kidney function after starting Lasix.

## 2023-08-17 DIAGNOSIS — C44319 Basal cell carcinoma of skin of other parts of face: Secondary | ICD-10-CM | POA: Diagnosis not present

## 2023-08-20 ENCOUNTER — Other Ambulatory Visit: Payer: Self-pay

## 2023-08-20 ENCOUNTER — Other Ambulatory Visit: Payer: Self-pay | Admitting: Pharmacy Technician

## 2023-08-20 ENCOUNTER — Other Ambulatory Visit: Payer: Self-pay | Admitting: Hematology

## 2023-08-20 DIAGNOSIS — E039 Hypothyroidism, unspecified: Secondary | ICD-10-CM | POA: Diagnosis not present

## 2023-08-20 DIAGNOSIS — D649 Anemia, unspecified: Secondary | ICD-10-CM | POA: Diagnosis not present

## 2023-08-20 DIAGNOSIS — R7301 Impaired fasting glucose: Secondary | ICD-10-CM | POA: Diagnosis not present

## 2023-08-20 DIAGNOSIS — N400 Enlarged prostate without lower urinary tract symptoms: Secondary | ICD-10-CM | POA: Diagnosis not present

## 2023-08-20 DIAGNOSIS — C9110 Chronic lymphocytic leukemia of B-cell type not having achieved remission: Secondary | ICD-10-CM

## 2023-08-20 DIAGNOSIS — E559 Vitamin D deficiency, unspecified: Secondary | ICD-10-CM | POA: Diagnosis not present

## 2023-08-20 MED ORDER — VENETOCLAX 100 MG PO TABS
300.0000 mg | ORAL_TABLET | Freq: Every day | ORAL | 3 refills | Status: DC
  Filled 2023-08-20: qty 84, 28d supply, fill #0
  Filled 2023-09-18: qty 84, 28d supply, fill #1
  Filled 2023-10-10: qty 84, 28d supply, fill #2
  Filled 2023-11-06: qty 84, 28d supply, fill #3

## 2023-08-20 NOTE — Progress Notes (Signed)
 Specialty Pharmacy Refill Coordination Note  Andrew [name] is a 83 y.o. male contacted today regarding refills of specialty medication(s) Venetoclax [name])   Patient requested Delivery   Delivery date: 08/29/23   Verified address: [name]  [place]   Medication will be filled on 08/28/23.  RR sent to Md

## 2023-08-23 ENCOUNTER — Ambulatory Visit: Admitting: Internal Medicine

## 2023-08-27 ENCOUNTER — Other Ambulatory Visit: Payer: Self-pay

## 2023-08-27 DIAGNOSIS — D649 Anemia, unspecified: Secondary | ICD-10-CM | POA: Diagnosis not present

## 2023-08-27 DIAGNOSIS — Z79899 Other long term (current) drug therapy: Secondary | ICD-10-CM | POA: Diagnosis not present

## 2023-08-27 DIAGNOSIS — E782 Mixed hyperlipidemia: Secondary | ICD-10-CM | POA: Diagnosis not present

## 2023-08-27 DIAGNOSIS — I5022 Chronic systolic (congestive) heart failure: Secondary | ICD-10-CM | POA: Diagnosis not present

## 2023-08-27 DIAGNOSIS — N184 Chronic kidney disease, stage 4 (severe): Secondary | ICD-10-CM | POA: Diagnosis not present

## 2023-08-27 DIAGNOSIS — I480 Paroxysmal atrial fibrillation: Secondary | ICD-10-CM | POA: Diagnosis not present

## 2023-08-27 DIAGNOSIS — E039 Hypothyroidism, unspecified: Secondary | ICD-10-CM | POA: Diagnosis not present

## 2023-08-27 DIAGNOSIS — R0600 Dyspnea, unspecified: Secondary | ICD-10-CM | POA: Diagnosis not present

## 2023-08-27 DIAGNOSIS — D6949 Other primary thrombocytopenia: Secondary | ICD-10-CM | POA: Diagnosis not present

## 2023-08-27 DIAGNOSIS — I130 Hypertensive heart and chronic kidney disease with heart failure and stage 1 through stage 4 chronic kidney disease, or unspecified chronic kidney disease: Secondary | ICD-10-CM | POA: Diagnosis not present

## 2023-08-27 DIAGNOSIS — C9110 Chronic lymphocytic leukemia of B-cell type not having achieved remission: Secondary | ICD-10-CM | POA: Diagnosis not present

## 2023-08-27 DIAGNOSIS — I214 Non-ST elevation (NSTEMI) myocardial infarction: Secondary | ICD-10-CM | POA: Diagnosis not present

## 2023-08-27 DIAGNOSIS — K2270 Barrett's esophagus without dysplasia: Secondary | ICD-10-CM | POA: Diagnosis not present

## 2023-08-28 ENCOUNTER — Other Ambulatory Visit: Payer: Self-pay

## 2023-08-28 LAB — BASIC METABOLIC PANEL WITH GFR
BUN/Creatinine Ratio: 14 (ref 10–24)
BUN: 42 mg/dL — ABNORMAL HIGH (ref 8–27)
CO2: 21 mmol/L (ref 20–29)
Calcium: 8.8 mg/dL (ref 8.6–10.2)
Chloride: 107 mmol/L — ABNORMAL HIGH (ref 96–106)
Creatinine, Ser: 2.93 mg/dL — ABNORMAL HIGH (ref 0.76–1.27)
Glucose: 143 mg/dL — ABNORMAL HIGH (ref 70–99)
Potassium: 4.2 mmol/L (ref 3.5–5.2)
Sodium: 142 mmol/L (ref 134–144)
eGFR: 21 mL/min/{1.73_m2} — ABNORMAL LOW (ref >59)

## 2023-08-28 NOTE — Progress Notes (Deleted)
 Referring Provider: Benita Stabile, MD Primary Care Physician:  Benita Stabile, MD Primary GI Physician: Dr. Bonnetta Barry chief complaint on file.   HPI:   Andrew [name] is a 83 y.o. male with GI history of GERD, long segment Barrett's esophagus, adenomatous colon polyps previously declining surveillance EGD and colonoscopy at last office visit in May 2023, now presenting today for ***  Today:    Chronic anemia***  Past Medical History:  Diagnosis Date   Arthritis    Ascending aortic aneurysm (HCC)    CAD (coronary artery disease)    a. s/p NSTEMI in 04/2023 with DES to LAD   CHF (congestive heart failure) (HCC)    cardiac catheterization in 2007 had revealed LVEF of 35% with no significant coronary disease   CKD (chronic kidney disease), stage IV (HCC)    followed by [facility]   CLL (chronic lymphocytic leukemia) (HCC)    Complication of anesthesia    "wild when I woke up "   History of kidney stones    passed    Hypertension    PAF (paroxysmal atrial fibrillation) (HCC)    Pinched nerve in neck    Pneumonia    SDH (subdural hematoma) (HCC)    Thrombocytopenia (HCC)     Past Surgical History:  Procedure Laterality Date   AV FISTULA PLACEMENT Left 04/30/2020   Procedure: LEFT ARM BRACHIOCEPHALIC ARTERIOVENOUS (AV) FISTULA CREATION;  Surgeon: Nada Libman, MD;  Location: [facility] OR;  Service: Vascular;  Laterality: Left;   BIOPSY  08/05/2018   Procedure: BIOPSY;  Surgeon: West Bali, MD;  Location: [facility] SU[facility];  Service: Endoscopy;;  gastric    Bone marrow biospy Bilateral    CATARACT EXTRACTION, BILATERAL     COLONOSCOPY  07/10/2011   Procedure: COLONOSCOPY;  Surgeon: Arlyce Harman, MD;  Location: [facility];  Service: Endoscopy;  Laterality: N/A;  10:30 AM   CORONARY IMAGING/OCT N/A 05/04/2023   Procedure: CORONARY IMAGING/OCT;  Surgeon: Yates Decamp, MD;  Location: [facility] INVASIVE CV LAB;  Service: Cardiovascular;  Laterality: N/A;   CORONARY PRESSURE/FFR  STUDY N/A 05/01/2023   Procedure: CORONARY PRESSURE/FFR STUDY;  Surgeon: Yates Decamp, MD;  Location: [facility] INVAS[facility] CV LAB;  Service: Cardiovascular;  Laterality: N/A;   CORONARY STENT INTERVENTION N/A 05/04/2023   Procedure: CORONARY STENT INTERVENTION;  Surgeon: Yates Decamp, MD;  Location: [facility] CV [facility];  Service: Cardiovascular;  Laterality: N/A;   CRANIOTOMY N/A 10/14/2016   Procedure: CRANIOTOMY HEMATOMA EVACUATION SUBDURAL;  Surgeon: Shirlean Kelly, MD;  Location: [facility] OR;  Service: Neurosurgery;  Laterality: N/A;  CRANIOTOMY HEMATOMA EVACUATION SUBDURAL   ESOPHAGOGASTRODUODENOSCOPY N/A 08/05/2018   Procedure: ESOPHAGOGASTRODUODENOSCOPY (EGD);  Surgeon: West Bali, MD;  Location: [facility];  Service: Endoscopy;  Laterality: N/A;  3:00pm   LEFT HEART CATH AND CORONARY ANGIOGRAPHY N/A 05/01/2023   Procedure: LEFT HEART CATH AND CORONARY ANGIOGRAPHY;  Surgeon: Yates Decamp, MD;  Location: [facility];  Service: Cardiovascular;  Laterality: N/A;   THYROIDECTOMY, PARTIAL     TONSILLECTOMY      Current Outpatient Medications  Medication Sig Dispense Refill   acetaminophen (TYLENOL) 325 MG tablet Take 650 mg by mouth every 6 (six) hours as needed for mild pain (pain score 1-3).     aspirin EC 81 MG tablet Take 1 tablet (81 mg total) by mouth daily. Swallow whole. 30 tablet 12   atorvastatin (LIPITOR) 80 MG tablet Take 1 tablet (80 mg  total) by mouth daily. 90 tablet 0   carvedilol (COREG) 12.5 MG tablet Take 1 tablet (12.5 mg total) by mouth 2 (two) times daily with a meal. 180 tablet 3   Cholecalciferol (VITAMIN D3) 25 MCG (1000 UT) CAPS Take 1 capsule by mouth daily.     Cyanocobalamin (VITAMIN B12) 1000 MCG TBCR Take 1,000 mcg by mouth daily.      ferrous sulfate 325 (65 FE) MG EC tablet Take 325 mg by mouth 3 (three) times a week.     furosemide (LASIX) 20 MG tablet Take 1 tablet (20 mg total) by mouth daily. 90 tablet 3   hydrALAZINE (APRESOLINE) 25 MG tablet Take 1 tablet (25 mg  total) by mouth every 8 (eight) hours. 240 tablet 0   isosorbide mononitrate (IMDUR) 60 MG 24 hr tablet Take 1 tablet (60 mg total) by mouth daily. 90 tablet 0   levothyroxine (SYNTHROID) 112 MCG tablet Take 112 mcg by mouth daily before breakfast.     Multiple Vitamin (MULITIVITAMIN WITH MINERALS) TABS Take 1 tablet by mouth daily.     pantoprazole (PROTONIX) 20 MG tablet Take 1 tablet (20 mg total) by mouth daily. 90 tablet 3   sodium bicarbonate 650 MG tablet Take 650 mg by mouth 3 (three) times daily.     tamsulosin (FLOMAX) 0.4 MG CAPS capsule Take 0.4 mg by mouth daily.      venetoclax (VENCLEXTA) 100 MG tablet Take 3 tablets (300 mg total) by mouth daily. 84 tablet 3   No current facility-administered medications for this visit.    Allergies as of 08/30/2023   (No Known Allergies)    Family History  Problem Relation Age of Onset   CAD Father 87   Cancer Mother    Alzheimer's disease Mother    Colon cancer Neg Hx     Social History   Socioeconomic History   Marital status: Married    Spouse name: Not on file   Number of children: Not on file   Years of education: Not on file   Highest education level: Not on file  Occupational History   Not on file  Tobacco Use   Smoking status: Former    Current packs/day: 0.00    Average packs/day: 1 pack/day for 15.0 years (15.0 ttl pk-yrs)    Types: Cigarettes    Start date: 01/26/1956    Quit date: 01/26/1971    Years since quitting: 52.6   Smokeless tobacco: Never  Vaping Use   Vaping status: Never Used  Substance and Sexual Activity   Alcohol use: No   Drug use: No   Sexual activity: Not on file  Other Topics Concern   Not on file  Social History Narrative   Not on file   Social Drivers of Health   Financial Resource Strain: Low Risk  (08/30/2020)   Overall Financial Resource Strain (CARDIA)    Difficulty of Paying Living Expenses: Not hard at all  Food Insecurity: No Food Insecurity (04/29/2023)   Hunger Vital Sign     Worried About Running Out of Food in the Last Year: Never true    Ran Out of Food in the Last Year: Never true  Transportation Needs: No Transportation Needs (04/29/2023)   PRAPARE - Administrator, Civil Service (Medical): No    Lack of Transportation (Non-Medical): No  Physical Activity: Inactive (08/30/2020)   Exercise Vital Sign    Days of Exercise per Week: 0 days    Minutes  of Exercise per Session: 0 min  Stress: No Stress Concern Present (08/30/2020)   [employer] of Occupational Health - Occupational Stress Questionnaire    Feeling of Stress : Not at all  Social Connections: Moderately Integrated (08/30/2020)   Social Connection and Isolation Panel [NHANES]    Frequency of Communication with Friends and Family: More than three times a week    Frequency of Social Gatherings with Friends and Family: More than three times a week    Attends Religious Services: More than 4 times per year    Active Member of [employer] or Organizations: No    Attends Banker Meetings: Never    Marital Status: Married    Review of Systems: Gen: Denies fever, chills, anorexia. Denies fatigue, weakness, weight loss.  CV: Denies chest pain, palpitations, syncope, peripheral edema, and claudication. Resp: Denies dyspnea at rest, cough, wheezing, coughing up blood, and pleurisy. GI: Denies vomiting blood, jaundice, and fecal incontinence.   Denies dysphagia or odynophagia. Derm: Denies rash, itching, dry skin Psych: Denies depression, anxiety, memory loss, confusion. No homicidal or suicidal ideation.  Heme: Denies bruising, bleeding, and enlarged lymph nodes.  Physical Exam: There were no vitals taken for this visit. General:   Alert and oriented. No distress noted. Pleasant and cooperative.  Head:  Normocephalic and atraumatic. Eyes:  Conjuctiva clear without scleral icterus. Heart:  S1, S2 present without murmurs appreciated. Lungs:  Clear to auscultation bilaterally. No  wheezes, rales, or rhonchi. No distress.  Abdomen:  +BS, soft, non-tender and non-distended. No rebound or guarding. No HSM or masses noted. Msk:  Symmetrical without gross deformities. Normal posture. Extremities:  Without edema. Neurologic:  Alert and  oriented x4 Psych:  Normal mood and affect.    Assessment:     Plan:  ***   Ermalinda Memos, PA-C [facility] 08/30/2023

## 2023-08-30 ENCOUNTER — Ambulatory Visit: Admitting: Gastroenterology

## 2023-09-03 DIAGNOSIS — R700 Elevated erythrocyte sedimentation rate: Secondary | ICD-10-CM | POA: Diagnosis not present

## 2023-09-07 DIAGNOSIS — N12 Tubulo-interstitial nephritis, not specified as acute or chronic: Secondary | ICD-10-CM | POA: Diagnosis not present

## 2023-09-07 DIAGNOSIS — D696 Thrombocytopenia, unspecified: Secondary | ICD-10-CM | POA: Diagnosis not present

## 2023-09-07 DIAGNOSIS — N200 Calculus of kidney: Secondary | ICD-10-CM | POA: Diagnosis not present

## 2023-09-07 DIAGNOSIS — N184 Chronic kidney disease, stage 4 (severe): Secondary | ICD-10-CM | POA: Diagnosis not present

## 2023-09-07 DIAGNOSIS — I770 Arteriovenous fistula, acquired: Secondary | ICD-10-CM | POA: Diagnosis not present

## 2023-09-07 DIAGNOSIS — C9110 Chronic lymphocytic leukemia of B-cell type not having achieved remission: Secondary | ICD-10-CM | POA: Diagnosis not present

## 2023-09-07 DIAGNOSIS — N179 Acute kidney failure, unspecified: Secondary | ICD-10-CM | POA: Diagnosis not present

## 2023-09-07 DIAGNOSIS — I129 Hypertensive chronic kidney disease with stage 1 through stage 4 chronic kidney disease, or unspecified chronic kidney disease: Secondary | ICD-10-CM | POA: Diagnosis not present

## 2023-09-07 DIAGNOSIS — Z8616 Personal history of COVID-19: Secondary | ICD-10-CM | POA: Diagnosis not present

## 2023-09-08 NOTE — Progress Notes (Unsigned)
 Referring Provider: Omie Bickers, MD  Primary Care Physician:  Omie Bickers, MD Primary Gastroenterologist:  Dr. Mordechai April  No chief complaint on file.   HPI:   Andrew [name] is a 83 y.o. male with GI history of GERD, long segment Barrett's esophagus, adenomatous colon polyps previously declining surveillance EGD and colonoscopy at last office visit in May 2023, now presenting today for ***   Today:    Chronic anemia***  Past Medical History:  Diagnosis Date   Arthritis    Ascending aortic aneurysm (HCC)    CAD (coronary artery disease)    a. s/p NSTEMI in 04/2023 with DES to LAD   CHF (congestive heart failure) (HCC)    cardiac catheterization in 2007 had revealed LVEF of 35% with no significant coronary disease   CKD (chronic kidney disease), stage IV (HCC)    followed by [facility]   CLL (chronic lymphocytic leukemia) (HCC)    Complication of anesthesia    "wild when I woke up "   History of kidney stones    passed    Hypertension    PAF (paroxysmal atrial fibrillation) (HCC)    Pinched nerve in neck    Pneumonia    SDH (subdural hematoma) (HCC)    Thrombocytopenia (HCC)     Past Surgical History:  Procedure Laterality Date   AV FISTULA PLACEMENT Left 04/30/2020   Procedure: LEFT ARM BRACHIOCEPHALIC ARTERIOVENOUS (AV) FISTULA CREATION;  Surgeon: Margherita Shell, MD;  Location: [facility] OR;  Service: Vascular;  Laterality: Left;   BIOPSY  08/05/2018   Procedure: BIOPSY;  Surgeon: Alyce Jubilee, MD;  Location: [facility];  Service: Endoscopy;;  gastric    Bone marrow biospy Bilateral    CATARACT EXTRACTION, BILATERAL     COLONOSCOPY  07/10/2011   Procedure: COLONOSCOPY;  Surgeon: Pauleen Borne, MD;  Location: [facility];  Service: Endoscopy;  Laterality: N/A;  10:30 AM   CORONARY IMAGING/OCT N/A 05/04/2023   Procedure: CORONARY IMAGING/OCT;  Surgeon: Knox Perl, MD;  Location: [facility];  Service: Cardiovascular;  Laterality: N/A;   CORONARY  PRESSURE/FFR STUDY N/A 05/01/2023   Procedure: CORONARY PRESSURE/FFR STUDY;  Surgeon: Knox Perl, MD;  Location: [facility];  Service: Cardiovascular;  Laterality: N/A;   CORONARY STENT INTERVENTION N/A 05/04/2023   Procedure: CORONARY STENT INTERVENTION;  Surgeon: Knox Perl, MD;  Location: [facility];  Service: Cardiovascular;  Laterality: N/A;   CRANIOTOMY N/A 10/14/2016   Procedure: CRANIOTOMY HEMATOMA EVACUATION SUBDURAL;  Surgeon: Yvonna Herder, MD;  Location: [facility] OR;  Service: Neurosurgery;  Laterality: N/A;  CRANIOTOMY HEMATOMA EVACUATION SUBDURAL   ESOPHAGOGASTRODUODENOSCOPY N/A 08/05/2018   Procedure: ESOPHAGOGASTRODUODENOSCOPY (EGD);  Surgeon: Alyce Jubilee, MD;  Location: [facility];  Service: Endoscopy;  Laterality: N/A;  3:00pm   LEFT HEART CATH AND CORONARY ANGIOGRAPHY N/A 05/01/2023   Procedure: LEFT HEART CATH AND CORONARY ANGIOGRAPHY;  Surgeon: Knox Perl, MD;  Location: [facility];  Service: Cardiovascular;  Laterality: N/A;   THYROIDECTOMY, PARTIAL     TONSILLECTOMY      Current Outpatient Medications  Medication Sig Dispense Refill   acetaminophen (TYLENOL) 325 MG tablet Take 650 mg by mouth every 6 (six) hours as needed for mild pain (pain score 1-3).     aspirin EC 81 MG tablet Take 1 tablet (81 mg total) by mouth daily. Swallow whole. 30 tablet 12   atorvastatin (LIPITOR) 80 MG tablet Take 1 tablet (80  mg total) by mouth daily. 90 tablet 0   carvedilol (COREG) 12.5 MG tablet Take 1 tablet (12.5 mg total) by mouth 2 (two) times daily with a meal. 180 tablet 3   Cholecalciferol (VITAMIN D3) 25 MCG (1000 UT) CAPS Take 1 capsule by mouth daily.     Cyanocobalamin (VITAMIN B12) 1000 MCG TBCR Take 1,000 mcg by mouth daily.      ferrous sulfate 325 (65 FE) MG EC tablet Take 325 mg by mouth 3 (three) times a week.     furosemide (LASIX) 20 MG tablet Take 1 tablet (20 mg total) by mouth daily. 90 tablet 3   hydrALAZINE (APRESOLINE) 25 MG tablet Take 1  tablet (25 mg total) by mouth every 8 (eight) hours. 240 tablet 0   isosorbide mononitrate (IMDUR) 60 MG 24 hr tablet Take 1 tablet (60 mg total) by mouth daily. 90 tablet 0   levothyroxine (SYNTHROID) 112 MCG tablet Take 112 mcg by mouth daily before breakfast.     Multiple Vitamin (MULITIVITAMIN WITH MINERALS) TABS Take 1 tablet by mouth daily.     pantoprazole (PROTONIX) 20 MG tablet Take 1 tablet (20 mg total) by mouth daily. 90 tablet 3   sodium bicarbonate 650 MG tablet Take 650 mg by mouth 3 (three) times daily.     tamsulosin (FLOMAX) 0.4 MG CAPS capsule Take 0.4 mg by mouth daily.      venetoclax (VENCLEXTA) 100 MG tablet Take 3 tablets (300 mg total) by mouth daily. 84 tablet 3   No current facility-administered medications for this visit.    Allergies as of 09/10/2023   (No Known Allergies)    Family History  Problem Relation Age of Onset   CAD Father 6   Cancer Mother    Alzheimer's disease Mother    Colon cancer Neg Hx     Social History   Socioeconomic History   Marital status: Married    Spouse name: Not on file   Number of children: Not on file   Years of education: Not on file   Highest education level: Not on file  Occupational History   Not on file  Tobacco Use   Smoking status: Former    Current packs/day: 0.00    Average packs/day: 1 pack/day for 15.0 years (15.0 ttl pk-yrs)    Types: Cigarettes    Start date: 01/26/1956    Quit date: 01/26/1971    Years since quitting: 52.6   Smokeless tobacco: Never  Vaping Use   Vaping status: Never Used  Substance and Sexual Activity   Alcohol use: No   Drug use: No   Sexual activity: Not on file  Other Topics Concern   Not on file  Social History Narrative   Not on file   Social Drivers of Health   Financial Resource Strain: Low Risk  (08/30/2020)   Overall Financial Resource Strain (CARDIA)    Difficulty of Paying Living Expenses: Not hard at all  Food Insecurity: No Food Insecurity (04/29/2023)    Hunger Vital Sign    Worried About Running Out of Food in the Last Year: Never true    Ran Out of Food in the Last Year: Never true  Transportation Needs: No Transportation Needs (04/29/2023)   PRAPARE - Administrator, Civil Service (Medical): No    Lack of Transportation (Non-Medical): No  Physical Activity: Inactive (08/30/2020)   Exercise Vital Sign    Days of Exercise per Week: 0 days  Minutes of Exercise per Session: 0 min  Stress: No Stress Concern Present (08/30/2020)   [employer] of Occupational Health - Occupational Stress Questionnaire    Feeling of Stress : Not at all  Social Connections: Moderately Integrated (08/30/2020)   Social Connection and Isolation Panel [NHANES]    Frequency of Communication with Friends and Family: More than three times a week    Frequency of Social Gatherings with Friends and Family: More than three times a week    Attends Religious Services: More than 4 times per year    Active Member of [employer] or Organizations: No    Attends Banker Meetings: Never    Marital Status: Married  Catering manager Violence: Not At Risk (04/29/2023)   Humiliation, Afraid, Rape, and Kick questionnaire    Fear of Current or Ex-Partner: No    Emotionally Abused: No    Physically Abused: No    Sexually Abused: No    Review of Systems: Gen: Denies any fever, chills, fatigue, weight loss, lack of appetite.  CV: Denies chest pain, heart palpitations, peripheral edema, syncope.  Resp: Denies shortness of breath at rest or with exertion. Denies wheezing or cough.  GI: Denies dysphagia or odynophagia. Denies jaundice, hematemesis, fecal incontinence. GU : Denies urinary burning, urinary frequency, urinary hesitancy MS: Denies joint pain, muscle weakness, cramps, or limitation of movement.  Derm: Denies rash, itching, dry skin Psych: Denies depression, anxiety, memory loss, and confusion Heme: Denies bruising, bleeding, and enlarged lymph  nodes.  Physical Exam: There were no vitals taken for this visit. General:   Alert and oriented. Pleasant and cooperative. Well-nourished and well-developed.  Head:  Normocephalic and atraumatic. Eyes:  Without icterus, sclera clear and conjunctiva pink.  Ears:  Normal auditory acuity. Lungs:  Clear to auscultation bilaterally. No wheezes, rales, or rhonchi. No distress.  Heart:  S1, S2 present without murmurs appreciated.  Abdomen:  +BS, soft, non-tender and non-distended. No HSM noted. No guarding or rebound. No masses appreciated.  Rectal:  Deferred  Msk:  Symmetrical without gross deformities. Normal posture. Extremities:  Without edema. Neurologic:  Alert and  oriented x4;  grossly normal neurologically. Skin:  Intact without significant lesions or rashes. Psych:  Alert and cooperative. Normal mood and affect.    Assessment:     Plan:  ***   Shana Daring, PA-C [facility] 09/10/2023

## 2023-09-10 ENCOUNTER — Encounter: Payer: Self-pay | Admitting: Gastroenterology

## 2023-09-10 ENCOUNTER — Telehealth: Payer: Self-pay | Admitting: *Deleted

## 2023-09-10 ENCOUNTER — Ambulatory Visit: Admitting: Gastroenterology

## 2023-09-10 VITALS — BP 100/55 | HR 72 | Temp 98.1°F | Ht 74.0 in | Wt 229.4 lb

## 2023-09-10 DIAGNOSIS — Z860102 Personal history of hyperplastic colon polyps: Secondary | ICD-10-CM | POA: Diagnosis not present

## 2023-09-10 DIAGNOSIS — K219 Gastro-esophageal reflux disease without esophagitis: Secondary | ICD-10-CM

## 2023-09-10 DIAGNOSIS — Z860101 Personal history of adenomatous and serrated colon polyps: Secondary | ICD-10-CM

## 2023-09-10 DIAGNOSIS — K2270 Barrett's esophagus without dysplasia: Secondary | ICD-10-CM

## 2023-09-10 DIAGNOSIS — Z8719 Personal history of other diseases of the digestive system: Secondary | ICD-10-CM | POA: Diagnosis not present

## 2023-09-10 NOTE — Telephone Encounter (Signed)
    Recent echo showed his EF had improved from 25 to 30% in 04/2023 to 50 to 55%. He should be of acceptable risk to proceed from a cardiac perspective without further cardiac testing. He did have DES placement to the LAD but it looks like they are not needing to hold ASA or Plavix by review of prior notes. If these were needing to be held, would wait at least 6 months from stent placement.   Signed, Dorma Gash, PA-C 09/10/2023, 1:01 PM

## 2023-09-10 NOTE — Telephone Encounter (Signed)
  Request for patient to stop medication prior to procedure or is needing cleareance  09/10/23  TIRSO LAWS Apr 08, 1941  What type of surgery is being performed? Colonoscopy/Esophagogastroduodenoscopy (EGD)  When is surgery scheduled? TBD  What type of clearance is required (medical or pharmacy to hold medication or both? Medical  Patient is needing to have cardiac clearance prior to being scheduled for procedure.  Name of physician performing surgery?  Dr.Carver [facility] at [employer]: [phone] Fax: [phone]  Anethesia type (none, local, MAC, general)? MAC

## 2023-09-10 NOTE — Telephone Encounter (Signed)
 Spoke with patient who states that he is on Plavix and I have updated his medication list.   I spoke with Tammy and she states that patient doesn't need to hold Plavix nor ASA for procedure.

## 2023-09-10 NOTE — Patient Instructions (Signed)
 We will get you scheduled for an upper endoscopy and colonoscopy with Dr. Mordechai April.   Continue pantoprazole daily.   Follow a GERD diet:  Avoid fried, fatty, greasy, spicy, citrus foods. Avoid caffeine and carbonated beverages. Avoid chocolate. Try eating 4-6 small meals a day rather than 3 large meals. Do not eat within 3 hours of laying down. Prop head of bed up on wood or bricks to create a 6 inch incline.  Follow-up in 1 year or sooner if needed.   Shana Daring, PA-C [facility]

## 2023-09-10 NOTE — Telephone Encounter (Signed)
   Patient Name: Andrew [name]  DOB: 04-Oct-1940 MRN: [id number]  Primary Cardiologist: Armida Lander, MD  Chart reviewed as part of pre-operative protocol coverage. Given past medical history and time since last visit, based on ACC/AHA guidelines, Andrew [name] is at acceptable risk for the planned procedure without further cardiovascular testing.   Patient is to continue Aspirin and Plavix throughout the perioperative period.  I will route this recommendation to the requesting party via Epic fax function and remove from pre-op pool.  Please call with questions.  Ava Boatman, NP 09/10/2023, 1:10 PM

## 2023-09-11 NOTE — Telephone Encounter (Signed)
   Name: Andrew [name]  DOB: 27-Oct-1940  MRN: [id number]  Primary Cardiologist: Armida Lander, MD  Chart reviewed as part of pre-operative protocol coverage. The patient has an upcoming visit scheduled with Woodfin Hays, PA on 12/11/2023 at which time clearance can be addressed in case there are any issues that would impact surgical recommendations.  Colonoscopy is not scheduled until TBD as below. I added preop FYI to appointment note so that provider is aware to address at time of outpatient visit.  Per office protocol the cardiology provider should forward their finalized clearance decision and recommendations regarding antiplatelet therapy to the requesting party below.    I will route this message as FYI to requesting party and remove this message from the preop box as separate preop APP input not needed at this time.   Please call with any questions.  Ava Boatman, NP  09/11/2023, 12:41 PM

## 2023-09-11 NOTE — Telephone Encounter (Signed)
 Received call. Patient unable to hold medication at this time. He will have a pre-op appointment in July and can reassess at that time. See documentation below.

## 2023-09-11 NOTE — Telephone Encounter (Signed)
 Patient is on Plavix. This was not listed on his current medication list. Dr. Mordechai April does require holding Plavix for 5 days. We will need to wait full 6 months after DES (placed 05/04/23) before scheduling procedures.   Tammy, we can proceed with scheduling procedures on 11/02/23 or thereafter. Will need to hold Plavix x 5 days prior.

## 2023-09-11 NOTE — Telephone Encounter (Signed)
 Dr.Carver does require pt to hold his Plavix x 5 days prior to procedure.   Request for patient to stop medication prior to procedure or is needing cleareance  09/11/23  Andrew [name] September 10, 1940  What type of surgery is being performed?  Colonoscopy/Esophagogastroduodenoscopy (EGD)   When is surgery scheduled? TBD  What type of clearance is required (medical or pharmacy to hold medication or both? medication  Are there any medications that need to be held prior to surgery and how long? Plavix x 5 days  Name of physician performing surgery?  Dr.Carver [facility] at [employer]: [phone] Fax: [phone]  Anethesia type (none, local, MAC, general)? MAC

## 2023-09-11 NOTE — Telephone Encounter (Signed)
 Thanks. Can we keep him on our radar to follow-u on clearance in July?

## 2023-09-11 NOTE — Telephone Encounter (Signed)
 Called requesting office regarding urgency of clearance procedure coordinator stated they have received a vm earlier regarding this matter I apologized to office for second call we will wait for they to address our first call

## 2023-09-11 NOTE — Telephone Encounter (Signed)
 Spoke with patient aware of July appointment to be clearance for colonoscopy due past procedures can not hold medication 6 month after.   Lvm on Schedular  Vm of the recent changes and clinic number to call back if need be. [phone]

## 2023-09-12 NOTE — Telephone Encounter (Signed)
 I will forward to all providers involved.

## 2023-09-12 NOTE — Telephone Encounter (Signed)
 noted

## 2023-09-18 ENCOUNTER — Other Ambulatory Visit: Payer: Self-pay

## 2023-09-18 NOTE — Progress Notes (Signed)
 Specialty Pharmacy Refill Coordination Note  Andrew [name] is a 83 y.o. male contacted today regarding refills of specialty medication(s) Venetoclax  (VENCLEXTA )   Patient requested (Patient-Rptd) Delivery   Delivery date: (Patient-Rptd) 09/24/23   Verified address: (Patient-Rptd) [address], [place],  [address]   Medication will be filled on 09/21/23.

## 2023-09-21 ENCOUNTER — Other Ambulatory Visit: Payer: Self-pay

## 2023-09-28 DIAGNOSIS — L570 Actinic keratosis: Secondary | ICD-10-CM | POA: Diagnosis not present

## 2023-09-28 DIAGNOSIS — X32XXXD Exposure to sunlight, subsequent encounter: Secondary | ICD-10-CM | POA: Diagnosis not present

## 2023-09-28 DIAGNOSIS — Z85828 Personal history of other malignant neoplasm of skin: Secondary | ICD-10-CM | POA: Diagnosis not present

## 2023-09-28 DIAGNOSIS — C44311 Basal cell carcinoma of skin of nose: Secondary | ICD-10-CM | POA: Diagnosis not present

## 2023-09-28 DIAGNOSIS — Z08 Encounter for follow-up examination after completed treatment for malignant neoplasm: Secondary | ICD-10-CM | POA: Diagnosis not present

## 2023-10-02 ENCOUNTER — Inpatient Hospital Stay: Payer: Medicare HMO | Attending: Hematology

## 2023-10-02 DIAGNOSIS — Z7969 Long term (current) use of other immunomodulators and immunosuppressants: Secondary | ICD-10-CM | POA: Insufficient documentation

## 2023-10-02 DIAGNOSIS — Z9221 Personal history of antineoplastic chemotherapy: Secondary | ICD-10-CM | POA: Diagnosis not present

## 2023-10-02 DIAGNOSIS — N184 Chronic kidney disease, stage 4 (severe): Secondary | ICD-10-CM | POA: Insufficient documentation

## 2023-10-02 DIAGNOSIS — D649 Anemia, unspecified: Secondary | ICD-10-CM | POA: Diagnosis not present

## 2023-10-02 DIAGNOSIS — Z87891 Personal history of nicotine dependence: Secondary | ICD-10-CM | POA: Insufficient documentation

## 2023-10-02 DIAGNOSIS — C9110 Chronic lymphocytic leukemia of B-cell type not having achieved remission: Secondary | ICD-10-CM | POA: Insufficient documentation

## 2023-10-02 DIAGNOSIS — N400 Enlarged prostate without lower urinary tract symptoms: Secondary | ICD-10-CM | POA: Insufficient documentation

## 2023-10-02 DIAGNOSIS — D696 Thrombocytopenia, unspecified: Secondary | ICD-10-CM | POA: Diagnosis not present

## 2023-10-02 LAB — CBC WITH DIFFERENTIAL/PLATELET
Abs Immature Granulocytes: 0.01 10*3/uL (ref 0.00–0.07)
Basophils Absolute: 0 10*3/uL (ref 0.0–0.1)
Basophils Relative: 0 %
Eosinophils Absolute: 0 10*3/uL (ref 0.0–0.5)
Eosinophils Relative: 0 %
HCT: 36.3 % — ABNORMAL LOW (ref 39.0–52.0)
Hemoglobin: 11.8 g/dL — ABNORMAL LOW (ref 13.0–17.0)
Immature Granulocytes: 0 %
Lymphocytes Relative: 15 %
Lymphs Abs: 0.4 10*3/uL — ABNORMAL LOW (ref 0.7–4.0)
MCH: 33.2 pg (ref 26.0–34.0)
MCHC: 32.5 g/dL (ref 30.0–36.0)
MCV: 102.3 fL — ABNORMAL HIGH (ref 80.0–100.0)
Monocytes Absolute: 0.5 10*3/uL (ref 0.1–1.0)
Monocytes Relative: 19 %
Neutro Abs: 1.7 10*3/uL (ref 1.7–7.7)
Neutrophils Relative %: 66 %
Platelets: 85 10*3/uL — ABNORMAL LOW (ref 150–400)
RBC: 3.55 MIL/uL — ABNORMAL LOW (ref 4.22–5.81)
RDW: 13.6 % (ref 11.5–15.5)
Smear Review: DECREASED
WBC: 2.6 10*3/uL — ABNORMAL LOW (ref 4.0–10.5)
nRBC: 0 % (ref 0.0–0.2)

## 2023-10-02 LAB — COMPREHENSIVE METABOLIC PANEL WITH GFR
ALT: 18 U/L (ref 0–44)
AST: 19 U/L (ref 15–41)
Albumin: 3.8 g/dL (ref 3.5–5.0)
Alkaline Phosphatase: 83 U/L (ref 38–126)
Anion gap: 7 (ref 5–15)
BUN: 32 mg/dL — ABNORMAL HIGH (ref 8–23)
CO2: 21 mmol/L — ABNORMAL LOW (ref 22–32)
Calcium: 8.9 mg/dL (ref 8.9–10.3)
Chloride: 107 mmol/L (ref 98–111)
Creatinine, Ser: 3.02 mg/dL — ABNORMAL HIGH (ref 0.61–1.24)
GFR, Estimated: 20 mL/min — ABNORMAL LOW (ref >60)
Glucose, Bld: 100 mg/dL — ABNORMAL HIGH (ref 70–99)
Potassium: 4.7 mmol/L (ref 3.5–5.1)
Sodium: 135 mmol/L (ref 135–145)
Total Bilirubin: 1.2 mg/dL (ref 0.0–1.2)
Total Protein: 6 g/dL — ABNORMAL LOW (ref 6.5–8.1)

## 2023-10-02 LAB — LACTATE DEHYDROGENASE: LDH: 160 U/L (ref 98–192)

## 2023-10-04 DIAGNOSIS — H52222 Regular astigmatism, left eye: Secondary | ICD-10-CM | POA: Diagnosis not present

## 2023-10-04 DIAGNOSIS — H5202 Hypermetropia, left eye: Secondary | ICD-10-CM | POA: Diagnosis not present

## 2023-10-04 DIAGNOSIS — H43813 Vitreous degeneration, bilateral: Secondary | ICD-10-CM | POA: Diagnosis not present

## 2023-10-04 DIAGNOSIS — H524 Presbyopia: Secondary | ICD-10-CM | POA: Diagnosis not present

## 2023-10-04 DIAGNOSIS — H5211 Myopia, right eye: Secondary | ICD-10-CM | POA: Diagnosis not present

## 2023-10-04 DIAGNOSIS — Z961 Presence of intraocular lens: Secondary | ICD-10-CM | POA: Diagnosis not present

## 2023-10-04 DIAGNOSIS — H04123 Dry eye syndrome of bilateral lacrimal glands: Secondary | ICD-10-CM | POA: Diagnosis not present

## 2023-10-08 NOTE — Progress Notes (Incomplete)
 [facility] [address] [place] [address]    Clinic Day:  10/08/23   Referring physician: Omie Bickers, MD  Patient Care Team: Omie Bickers, MD as PCP - General (Internal Medicine) Amanda Jungling Joyceann No, MD as PCP - Cardiology (Cardiology) Vinetta Greening, DO as Consulting Physician (Internal Medicine) Paulett Boros, MD as Consulting Physician (Hematology)   ASSESSMENT & PLAN:   Assessment: 1.  CLL, Rai stage IV: -BMBX on 08/10/2016 shows bone marrow involvement by CLL. -He also had bulky diffuse adenopathy with progressive thrombocytopenia. -Imbruvica  420 mg from 08/19/2016 through May 2018, discontinued due to subdural hematoma on 10/14/2016. -Subsequently offered chlorambucil  and obinutuzumab.  He refused obinutuzumab.  Chlorambucil  30 mg every 2 weeks from June 2019 through 11/07/2018 due to severe weakness. -PET scan on 12/01/2019 showed multiple bilateral enlarged cervical lymph nodes, bilateral axillary and mediastinal adenopathy, prevascular lymph nodes, left lower paratracheal lymph node, minimal adenopathy in the retroperitoneum, external iliac lymph nodes.  Spleen measures 16 cm.  All lymph nodes measure less than 5 cm with SUV less than 5. 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 -T p53 mutation is pending. -Venetoclax  10 mg started on 12/02/2019 (dose reduced secondary to Coreg ), dose increased to 50 mg daily on 12/12/2019. -Dose increased to 100 mg daily on 12/19/2019, held on 12/25/2019 due to elevated creatinine. -Venetoclax  100 mg daily started back on 01/15/2020.  Dose increased to 200 mg daily on 07/01/2020.  Dose increased to 300 mg daily on 09/29/2020. - Rituximab  6 cycles completed on 07/29/2020. - CT CAP on 12/16/2020 showed complete resolution of lymphadenopathy in the chest.  Spleen size is normal.  Subcentimeter lymphadenopathy in the left external iliac region and left inguinal region.   2.  CKD: -Kidney biopsy on 12/19/2017  shows CLL involvement with diffuse severe interstitial nephritis and associated tubular centric granulomatous reaction.   Plan: 1.  CLL, Rai stage IV: - He is tolerating venetoclax  very well. - Physical exam: No palpable adenopathy or splenomegaly. - He recently had an NSTEMI in December and is currently on Brilinta , aspirin , Lipitor , Imdur . - Reviewed labs today: LDH was normal.  White count is 3.4 with normal ANC.  Platelet count is stable at 90. - I have checked into drug interactions with venetoclax  and did not see any.  Continue venetoclax  300 mg daily.  Recommend follow-up in 4 months with repeat labs.   2.  CKD: - Creatinine is stable at 3.16.  Continue monitoring with Dr. Irene Mannheim.   3.  Normocytic anemia: - Hemoglobin stable at 11.5.  Ferritin is 126 and percent saturation 23. - Continue iron  tablet 3 times weekly.  No orders of the defined types were placed in this encounter.      Nadeen Augusta Teague,acting as a Neurosurgeon for Paulett Boros, MD.,have documented all relevant documentation on the behalf of Paulett Boros, MD,as directed by  Paulett Boros, MD while in the presence of Paulett Boros, MD.  ***    Oscoda R Teague   5/12/20254:30 PM  CHIEF COMPLAINT:   Diagnosis: CLL    Cancer Staging  CLL (chronic lymphocytic leukemia) (HCC) Staging form: Chronic Lymphocytic Leukemia / Small Lymphocytic Lymphoma, AJCC 8th Edition - Clinical stage from 09/04/2016: Modified Rai Stage IV (Modified Rai risk: High, Binet: Stage C, Lymphocytosis: Present, Adenopathy: Present, Organomegaly: Present, Anemia: Absent, Thrombocytopenia: Present) - Signed by Doretta Gant, PA-C on 09/04/2016    Prior Therapy: none  Current  Therapy:  Venetoclax  300 mg daily    HISTORY OF PRESENT ILLNESS:   Oncology History  CLL (chronic lymphocytic leukemia) (HCC)  03/09/2015 Initial Diagnosis   CLL (chronic lymphocytic leukemia) (HCC)   08/03/2016 Imaging   CT neck- 1.  Diffuse and bulky lymphadenopathy in keeping with history of CLL. Nodal enlargement has diffusely progressed since April 2016. 2. Chest and abdomen CT reported separately.   08/03/2016 Imaging   CT CAP- 1. Bulky lymphadenopathy in the chest, abdomen, and pelvis as described. 2. Splenomegaly. 3. Coronary artery and thoracoabdominal aortic atherosclerosis. 4. Prostatomegaly. 5. Several tiny lucent lesions in the bony anatomic pelvis, nonspecific, but attention on follow-up imaging recommended.   08/10/2016 Procedure   Bone marrow aspiration and biopsy by IR.   08/11/2016 Pathology Results   Bone Marrow, Aspirate,Biopsy, and Clot, left iliac crest - MARKED INVOLVEMENT BY CHRONIC LYMPHOCYTIC LEUKEMIA/SMALL LYMPHOCYTIC LYMPHOMA. PERIPHERAL BLOOD: - CHRONIC LYMPHOCYTIC LEUKEMIA. - THROMBOCYTOPENIA.   08/11/2016 Pathology Results   Bone Marrow Flow Cytometry - CHRONIC LYMPHOCYTIC LEUKEMIA/SMALL LYMPHOCYTIC LYMPHOMA.   08/16/2016 Pathology Results   Normal cytogenetics.  Molecular cytogenetic analysis by [facility] demonstrates a 13q-   08/19/2016 -  Chemotherapy   Imbruvica  420 mg daily    03/11/2020 - 07/29/2020 Chemotherapy   Patient is on Treatment Plan : CLL Rituximab  q28d        INTERVAL HISTORY:   Adrick is a 83 y.o. male presenting to clinic today for follow up of CLL. He was last seen by me on 06/05/23.  Today, he states that he is doing well overall. His appetite level is at ***%. His energy level is at ***%.   PAST MEDICAL HISTORY:   Past Medical History: Past Medical History:  Diagnosis Date   Arthritis    Ascending aortic aneurysm (HCC)    CAD (coronary artery disease)    a. s/p NSTEMI in 04/2023 with DES to LAD   CHF (congestive heart failure) (HCC)    cardiac catheterization in 2007 had revealed LVEF of 35% with no significant coronary disease   CKD (chronic kidney disease), stage IV (HCC)    followed by [facility]   CLL (chronic lymphocytic leukemia) (HCC)     Complication of anesthesia    "wild when I woke up "   History of kidney stones    passed    Hypertension    PAF (paroxysmal atrial fibrillation) (HCC)    Pinched nerve in neck    Pneumonia    SDH (subdural hematoma) (HCC)    Thrombocytopenia (HCC)     Surgical History: Past Surgical History:  Procedure Laterality Date   AV FISTULA PLACEMENT Left 04/30/2020   Procedure: LEFT ARM BRACHIOCEPHALIC ARTERIOVENOUS (AV) FISTULA CREATION;  Surgeon: Margherita Shell, MD;  Location: [facility] OR;  Service: Vascular;  Laterality: Left;   BIOPSY  08/05/2018   Procedure: BIOPSY;  Surgeon: Alyce Jubilee, MD;  Location: [facility] ENDO SUITE;  Service: Endoscopy;;  gastric    Bone marrow biospy Bilateral    CATARACT EXTRACTION, BILATERAL     COLONOSCOPY  07/10/2011   Procedure: COLONOSCOPY;  Surgeon: Pauleen Borne, MD;  Location: [facility] END[facility] SUITE;  Service: Endoscopy;  Laterality: N/A;  10:30 AM   CORONARY IMAGING/OCT N/A 05/04/2023   Procedure: CORONARY IMAGING/OCT;  Surgeon: Knox Perl, MD;  Location: [facility] INVASIVE CV LAB;  Service: Cardiovascular;  Laterality: N/A;   CORONARY PRESSURE/FFR STUDY N/A 05/01/2023   Procedure: CORONARY PRESSURE/FFR STUDY;  Surgeon: Knox Perl, MD;  Location: [facility] INVASIVE  CV LAB;  Service: Cardiovascular;  Laterality: N/A;   CORONARY STENT INTERVENTION N/A 05/04/2023   Procedure: CORONARY STENT INTERVENTION;  Surgeon: Knox Perl, MD;  Location: [facility] INVASIVE CV LAB;  Service: Cardiovascular;  Laterality: N/A;   CRANIOTOMY N/A 10/14/2016   Procedure: CRANIOTOMY HEMATOMA EVACUATION SUBDURAL;  Surgeon: Yvonna Herder, MD;  Location: [facility] OR;  Service: Neurosurgery;  Laterality: N/A;  CRANIOTOMY HEMATOMA EVACUATION SUBDURAL   ESOPHAGOGASTRODUODENOSCOPY N/A 08/05/2018   Procedure: ESOPHAGOGASTRODUODENOSCOPY (EGD);  Surgeon: Alyce Jubilee, MD;  Location: [facility];  Service: Endoscopy;  Laterality: N/A;  3:00pm   LEFT HEART CATH AND CORONARY ANGIOGRAPHY N/A 05/01/2023   Procedure: LEFT HEART CATH  AND CORONARY ANGIOGRAPHY;  Surgeon: Knox Perl, MD;  Location: [facility] INVASIVE CV LAB;  Service: Cardiovascular;  Laterality: N/A;   THYROIDECTOMY, PARTIAL     TONSILLECTOMY      Social History: Social History   Socioeconomic History   Marital status: Married    Spouse name: Not on file   Number of children: Not on file   Years of education: Not on file   Highest education level: Not on file  Occupational History   Not on file  Tobacco Use   Smoking status: Former    Current packs/day: 0.00    Average packs/day: 1 pack/day for 15.0 years (15.0 ttl pk-yrs)    Types: Cigarettes    Start date: 01/26/1956    Quit date: 01/26/1971    Years since quitting: 52.7   Smokeless tobacco: Never  Vaping Use   Vaping status: Never Used  Substance and Sexual Activity   Alcohol use: No   Drug use: No   Sexual activity: Not on file  Other Topics Concern   Not on file  Social History Narrative   Not on file   Social Drivers of Health   Financial Resource Strain: Low Risk  (08/30/2020)   Overall Financial Resource Strain (CARDIA)    Difficulty of Paying Living Expenses: Not hard at all  Food Insecurity: No Food Insecurity (04/29/2023)   Hunger Vital Sign    Worried About Running Out of Food in the Last Year: Never true    Ran Out of Food in the Last Year: Never true  Transportation Needs: No Transportation Needs (04/29/2023)   PRAPARE - Administrator, Civil Service (Medical): No    Lack of Transportation (Non-Medical): No  Physical Activity: Inactive (08/30/2020)   Exercise Vital Sign    Days of Exercise per Week: 0 days    Minutes of Exercise per Session: 0 min  Stress: No Stress Concern Present (08/30/2020)   [employer] of Occupational Health - Occupational Stress Questionnaire    Feeling of Stress : Not at all  Social Connections: Moderately Integrated (08/30/2020)   Social Connection and Isolation Panel [NHANES]    Frequency of Communication with Friends and Family: More  than three times a week    Frequency of Social Gatherings with Friends and Family: More than three times a week    Attends Religious Services: More than 4 times per year    Active Member of [employer] or Organizations: No    Attends Banker Meetings: Never    Marital Status: Married  Catering manager Violence: Not At Risk (04/29/2023)   Humiliation, Afraid, Rape, and Kick questionnaire    Fear of Current or Ex-Partner: No    Emotionally Abused: No    Physically Abused: No    Sexually Abused: No    Family  History: Family History  Problem Relation Age of Onset   CAD Father 74   Cancer Mother    Alzheimer's disease Mother    Colon cancer Neg Hx     Current Medications:  Current Outpatient Medications:    acetaminophen  (TYLENOL ) 325 MG tablet, Take 650 mg by mouth every 6 (six) hours as needed for mild pain (pain score 1-3)., Disp: , Rfl:    aspirin  EC 81 MG tablet, Take 1 tablet (81 mg total) by mouth daily. Swallow whole., Disp: 30 tablet, Rfl: 12   atorvastatin  (LIPITOR ) 80 MG tablet, Take 1 tablet (80 mg total) by mouth daily., Disp: 90 tablet, Rfl: 0   carvedilol  (COREG ) 12.5 MG tablet, Take 1 tablet (12.5 mg total) by mouth 2 (two) times daily with a meal., Disp: 180 tablet, Rfl: 3   Cholecalciferol  (VITAMIN D3) 25 MCG (1000 UT) CAPS, Take 1 capsule by mouth daily., Disp: , Rfl:    clopidogrel  (PLAVIX ) 75 MG tablet, Take 75 mg by mouth daily., Disp: , Rfl:    Cyanocobalamin  (VITAMIN B12) 1000 MCG TBCR, Take 1,000 mcg by mouth daily. , Disp: , Rfl:    ferrous sulfate  325 (65 FE) MG EC tablet, Take 325 mg by mouth 3 (three) times a week., Disp: , Rfl:    furosemide  (LASIX ) 20 MG tablet, Take 1 tablet (20 mg total) by mouth daily., Disp: 90 tablet, Rfl: 3   hydrALAZINE  (APRESOLINE ) 25 MG tablet, Take 1 tablet (25 mg total) by mouth every 8 (eight) hours., Disp: 240 tablet, Rfl: 0   isosorbide  mononitrate (IMDUR ) 60 MG 24 hr tablet, Take 1 tablet (60 mg total) by mouth  daily., Disp: 90 tablet, Rfl: 0   levothyroxine  (SYNTHROID ) 112 MCG tablet, Take 112 mcg by mouth daily before breakfast., Disp: , Rfl:    Multiple Vitamin (MULITIVITAMIN WITH MINERALS) TABS, Take 1 tablet by mouth daily., Disp: , Rfl:    pantoprazole  (PROTONIX ) 20 MG tablet, Take 1 tablet (20 mg total) by mouth daily., Disp: 90 tablet, Rfl: 3   sodium bicarbonate  650 MG tablet, Take 650 mg by mouth 3 (three) times daily., Disp: , Rfl:    tamsulosin  (FLOMAX ) 0.4 MG CAPS capsule, Take 0.4 mg by mouth daily. , Disp: , Rfl:    venetoclax  (VENCLEXTA ) 100 MG tablet, Take 3 tablets (300 mg total) by mouth daily., Disp: 84 tablet, Rfl: 3   Allergies: No Known Allergies  REVIEW OF SYSTEMS:   Review of Systems  Constitutional:  Negative for chills, fatigue and fever.  HENT:   Negative for lump/mass, mouth sores, nosebleeds, sore throat and trouble swallowing.   Eyes:  Negative for eye problems.  Respiratory:  Negative for cough and shortness of breath.   Cardiovascular:  Negative for chest pain, leg swelling and palpitations.  Gastrointestinal:  Negative for abdominal pain, constipation, diarrhea, nausea and vomiting.  Genitourinary:  Negative for bladder incontinence, difficulty urinating, dysuria, frequency, hematuria and nocturia.   Musculoskeletal:  Negative for arthralgias, back pain, flank pain, myalgias and neck pain.  Skin:  Negative for itching and rash.  Neurological:  Negative for dizziness, headaches and numbness.  Hematological:  Does not bruise/bleed easily.  Psychiatric/Behavioral:  Negative for depression, sleep disturbance and suicidal ideas. The patient is not nervous/anxious.   All other systems reviewed and are negative.    VITALS:   There were no vitals taken for this visit.  Wt Readings from Last 3 Encounters:  09/10/23 229 lb 6.4 oz (104.1 kg)  08/07/23 233  lb (105.7 kg)  06/05/23 226 lb 10.1 oz (102.8 kg)    There is no height or weight on file to calculate  BMI.  Performance status (ECOG): 1 - Symptomatic but completely ambulatory  PHYSICAL EXAM:   Physical Exam Vitals and nursing note reviewed. Exam conducted with a chaperone present.  Constitutional:      Appearance: Normal appearance.  Cardiovascular:     Rate and Rhythm: Normal rate and regular rhythm.     Pulses: Normal pulses.     Heart sounds: Normal heart sounds.  Pulmonary:     Effort: Pulmonary effort is normal.     Breath sounds: Normal breath sounds.  Abdominal:     Palpations: Abdomen is soft. There is no hepatomegaly, splenomegaly or mass.     Tenderness: There is no abdominal tenderness.  Musculoskeletal:     Right lower leg: No edema.     Left lower leg: No edema.  Lymphadenopathy:     Cervical: No cervical adenopathy.     Right cervical: No superficial, deep or posterior cervical adenopathy.    Left cervical: No superficial, deep or posterior cervical adenopathy.     Upper Body:     Right upper body: No supraclavicular or axillary adenopathy.     Left upper body: No supraclavicular or axillary adenopathy.  Neurological:     General: No focal deficit present.     Mental Status: He is alert and oriented to person, place, and time.  Psychiatric:        Mood and Affect: Mood normal.        Behavior: Behavior normal.     LABS:      Latest Ref Rng & Units 10/02/2023    9:55 AM 08/07/2023    2:12 PM 05/29/2023    8:45 AM  CBC  WBC 4.0 - 10.5 K/uL 2.6  2.4  3.4   Hemoglobin 13.0 - 17.0 g/dL 69.6  29.5  28.4   Hematocrit 39.0 - 52.0 % 36.3  32.6  33.9   Platelets 150 - 400 K/uL 85  98  90       Latest Ref Rng & Units 10/02/2023    9:55 AM 08/27/2023    9:12 AM 05/29/2023    8:45 AM  CMP  Glucose 70 - 99 mg/dL 132  440  102   BUN 8 - 23 mg/dL 32  42  38   Creatinine 0.61 - 1.24 mg/dL 7.25  3.66  4.40   Sodium 135 - 145 mmol/L 135  142  138   Potassium 3.5 - 5.1 mmol/L 4.7  4.2  4.3   Chloride 98 - 111 mmol/L 107  107  109   CO2 22 - 32 mmol/L 21  21  22     Calcium  8.9 - 10.3 mg/dL 8.9  8.8  9.0   Total Protein 6.5 - 8.1 g/dL 6.0   6.3   Total Bilirubin 0.0 - 1.2 mg/dL 1.2   1.0   Alkaline Phos 38 - 126 U/L 83   73   AST 15 - 41 U/L 19   19   ALT 0 - 44 U/L 18   19      No results found for: "CEA1", "CEA" / No results found for: "CEA1", "CEA" No results found for: "PSA1" No results found for: "HKV425" No results found for: "CAN125"  Lab Results  Component Value Date   TOTALPROTELP 6.1 11/04/2015   ALBUMINELP 4.1 11/04/2015   A1GS 0.2 11/04/2015  A2GS 0.5 11/04/2015   BETS 0.8 11/04/2015   GAMS 0.4 11/04/2015   MSPIKE Not Observed 11/04/2015   SPEI Comment 11/04/2015   Lab Results  Component Value Date   TIBC 313 05/29/2023   TIBC 324 01/09/2023   TIBC 315 08/31/2022   FERRITIN 126 05/29/2023   FERRITIN 150 01/09/2023   FERRITIN 86 08/31/2022   IRONPCTSAT 23 05/29/2023   IRONPCTSAT 26 01/09/2023   IRONPCTSAT 30 08/31/2022   Lab Results  Component Value Date   LDH 160 10/02/2023   LDH 156 05/29/2023   LDH 149 01/09/2023     STUDIES:   No results found.

## 2023-10-09 ENCOUNTER — Other Ambulatory Visit: Payer: Self-pay | Admitting: *Deleted

## 2023-10-09 ENCOUNTER — Inpatient Hospital Stay: Payer: Medicare HMO | Admitting: Hematology

## 2023-10-09 ENCOUNTER — Inpatient Hospital Stay (HOSPITAL_BASED_OUTPATIENT_CLINIC_OR_DEPARTMENT_OTHER): Admitting: Hematology

## 2023-10-09 DIAGNOSIS — N185 Chronic kidney disease, stage 5: Secondary | ICD-10-CM | POA: Diagnosis not present

## 2023-10-09 DIAGNOSIS — C9110 Chronic lymphocytic leukemia of B-cell type not having achieved remission: Secondary | ICD-10-CM

## 2023-10-09 NOTE — Progress Notes (Signed)
 Virtual Visit via Telephone Note  I connected with Andrew [name] on 10/09/23 at 11:45 AM EDT by telephone and verified that I am speaking with the correct person using two identifiers.  Location: Patient: At home Provider: At home   I discussed the limitations, risks, security and privacy concerns of performing an evaluation and management service by telephone and the availability of in person appointments. I also discussed with the patient that there may be a patient responsible charge related to this service. The patient expressed understanding and agreed to proceed.   History of Present Illness: He is seen in our clinic for follow-up of CLL.  He is currently on venetoclax .   Observations/Objective: He reports appetite 100% and energy 50%.  He denies any fevers, night sweats or weight loss in the last 4 months.  He has constipation since he was started on new medication after his heart attack.  This is also stable.  No painful adenopathy reported.  Assessment and Plan: 1.  Relapsed CLL: - She does not have any B symptoms or recurrent infections. - She is tolerating venetoclax  300 mg daily very well. - I reviewed labs from 10/02/2023: Creatinine is stable at 3.02.  LFTs are normal.  LDH is normal.  Mild leukopenia and thrombocytopenia stable. - I have recommended PET/CT scan.  If PET CT scan has no activity, we may consider discontinuing venetoclax  with close observation.  He is agreeable.  We will schedule PET scan and see him back.  2.  Macrocytic anemia: - Multifactorial anemia from CKD, functional iron  deficiency, and venetoclax . - Hemoglobin is 11.8.  He is continuing iron  tablet 3 times a week.   Follow Up Instructions:    I discussed the assessment and treatment plan with the patient. The patient was provided an opportunity to ask questions and all were answered. The patient agreed with the plan and demonstrated an understanding of the instructions.   The patient was advised  to call back or seek an in-person evaluation if the symptoms worsen or if the condition fails to improve as anticipated.  I provided 25 minutes of non-face-to-face time during this encounter.   Paulett Boros, MD

## 2023-10-10 ENCOUNTER — Other Ambulatory Visit: Payer: Self-pay

## 2023-10-10 NOTE — Progress Notes (Signed)
 Specialty Pharmacy Refill Coordination Note  Andrew [name] is a 83 y.o. male contacted today regarding refills of specialty medication(s) Venetoclax  (VENCLEXTA )   Patient requested (Patient-Rptd) Delivery   Delivery date: (Patient-Rptd) 10/17/23   Verified address: (Patient-Rptd) [address], [place], [place]  [address]   Medication will be filled on 05.20.25.

## 2023-10-15 ENCOUNTER — Ambulatory Visit: Admitting: Urology

## 2023-10-15 VITALS — BP 92/57 | HR 93

## 2023-10-15 DIAGNOSIS — R3912 Poor urinary stream: Secondary | ICD-10-CM

## 2023-10-15 DIAGNOSIS — N138 Other obstructive and reflux uropathy: Secondary | ICD-10-CM

## 2023-10-15 DIAGNOSIS — N401 Enlarged prostate with lower urinary tract symptoms: Secondary | ICD-10-CM

## 2023-10-15 DIAGNOSIS — R972 Elevated prostate specific antigen [PSA]: Secondary | ICD-10-CM | POA: Diagnosis not present

## 2023-10-15 LAB — MICROSCOPIC EXAMINATION: WBC, UA: >30 /HPF — AB (ref 0–5)

## 2023-10-15 LAB — URINALYSIS, ROUTINE W REFLEX MICROSCOPIC
Bilirubin, UA: NEGATIVE
Glucose, UA: NEGATIVE
Ketones, UA: NEGATIVE
Nitrite, UA: NEGATIVE
RBC, UA: NEGATIVE
Specific Gravity, UA: 1.01 (ref 1.005–1.030)
Urobilinogen, Ur: 0.2 mg/dL (ref 0.2–1.0)
pH, UA: 6.5 (ref 5.0–7.5)

## 2023-10-15 MED ORDER — FINASTERIDE 5 MG PO TABS: 5.0000 mg | ORAL_TABLET | Freq: Every day | ORAL | 3 refills | Status: AC

## 2023-10-15 NOTE — Progress Notes (Unsigned)
 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 [place],  [place] [address]  No chief complaint on file.   HPI:  New pt -   1) BPH - prostate 80 g on CT in 2024. On tamsulosin . PET scan pending. 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 Linnell Richardson. CKD Cr 3.0, gfr 20. AFIB, CAD.   2) PSA elevation - Prostate 80 g in 2024. PSA was 4.5 then 4.6 then 5.0 and 6.7 and 7.9 in Mar 2025.    Today, seen for the above. He quit smoking yrs ago but smoked for 16 yrs. No xrt. On chemo for CLL and had infusions.   On Imdur  and Plavix . He was a [employer] architect.    PMH: Past Medical History:  Diagnosis Date   Arthritis    Ascending aortic aneurysm (HCC)    CAD (coronary artery disease)    a. s/p NSTEMI in 04/2023 with DES to LAD   CHF (congestive heart failure) (HCC)    cardiac catheterization in 2007 had revealed LVEF of 35% with no significant coronary disease   CKD (chronic kidney disease), stage IV (HCC)    followed by [facility]   CLL (chronic lymphocytic leukemia) (HCC)    Complication of anesthesia    "wild when I woke up "   History of kidney stones    passed    Hypertension    PAF (paroxysmal atrial fibrillation) (HCC)    Pinched nerve in neck    Pneumonia    SDH (subdural hematoma) (HCC)    Thrombocytopenia (HCC)     Surgical History: Past Surgical History:  Procedure Laterality Date   AV FISTULA PLACEMENT Left 04/30/2020   Procedure: LEFT ARM BRACHIOCEPHALIC ARTERIOVENOUS (AV) FISTULA CREATION;  Surgeon: Margherita Shell, MD;  Location: [facility] OR;  Service: Vascular;  Laterality: Left;   BIOPSY  08/05/2018   Procedure: BIOPSY;  Surgeon: Alyce Jubilee, MD;  Location: [facility];  Service: Endoscopy;;  gastric    Bone marrow biospy Bilateral    CATARACT EXTRACTION, BILATERAL     COLONOSCOPY  07/10/2011   Procedure: COLONOSCOPY;  Surgeon: Pauleen Borne, MD;  Location: [facility];  Service: Endoscopy;  Laterality: N/A;  10:30  AM   CORONARY IMAGING/OCT N/A 05/04/2023   Procedure: CORONARY IMAGING/OCT;  Surgeon: Knox Perl, MD;  Location: [facility];  Service: Cardiovascular;  Laterality: N/A;   CORONARY PRESSURE/FFR STUDY N/A 05/01/2023   Procedure: CORONARY PRESSURE/FFR STUDY;  Surgeon: Knox Perl, MD;  Location: [facility];  Service: Cardiovascular;  Laterality: N/A;   CORONARY STENT INTERVENTION N/A 05/04/2023   Procedure: CORONARY STENT INTERVENTION;  Surgeon: Knox Perl, MD;  [facility]: [facility];  Service: Cardiovascular;  Laterality: N/A;   CRANIOTOMY N/A 10/14/2016   Procedure: CRANIOTOMY HEMATOMA EVACUATION SUBDURAL;  Surgeon: Yvonna Herder, MD;  Location: [facility] OR;  Service: Neurosurgery;  Laterality: N/A;  CRANIOTOMY HEMATOMA EVACUATION SUBDURAL   ESOPHAGOGASTRODUODENOSCOPY N/A 08/05/2018   Procedure: ESOPHAGOGASTRODUODENOSCOPY (EGD);  Surgeon: Alyce Jubilee, MD;  [facility];  Service: Endoscopy;  Laterality: N/A;  3:00pm   LEFT HEART CATH AND CORONARY ANGIOGRAPHY N/A 05/01/2023   Procedure: LEFT HEART CATH AND CORONARY ANGIOGRAPHY;  Surgeon: Knox Perl, MD;  Location: [facility];  Service: Cardiovascular;  Laterality: N/A;   THYROIDECTOMY, PARTIAL     TONSILLECTOMY      Home Medications:  Allergies as of  10/15/2023   No Known Allergies      Medication List        Accurate as of Oct 15, 2023 10:09 AM. If you have any questions, ask your nurse or doctor.          acetaminophen  325 MG tablet Commonly known as: TYLENOL  Take 650 mg by mouth every 6 (six) hours as needed for mild pain (pain score 1-3).   amLODipine  5 MG tablet Commonly known as: NORVASC  Take 5 mg by mouth daily.   aspirin  EC 81 MG tablet Take 1 tablet (81 mg total) by mouth daily. Swallow whole.   atorvastatin  80 MG tablet Commonly known as: LIPITOR  Take 1 tablet (80 mg total) by mouth daily.   carvedilol  12.5 MG tablet Commonly known as: COREG  Take 1 tablet (12.5 mg total) by  mouth 2 (two) times daily with a meal.   clopidogrel  75 MG tablet Commonly known as: PLAVIX  Take 75 mg by mouth daily.   ferrous sulfate  325 (65 FE) MG EC tablet Take 325 mg by mouth 3 (three) times a week.   furosemide  20 MG tablet Commonly known as: Lasix  Take 1 tablet (20 mg total) by mouth daily.   hydrALAZINE  25 MG tablet Commonly known as: APRESOLINE  Take 1 tablet (25 mg total) by mouth every 8 (eight) hours.   isosorbide  mononitrate 60 MG 24 hr tablet Commonly known as: IMDUR  Take 1 tablet (60 mg total) by mouth daily.   levothyroxine  100 MCG tablet Commonly known as: SYNTHROID  Take 100 mcg by mouth daily.   multivitamin with minerals Tabs tablet Take 1 tablet by mouth daily.   pantoprazole  20 MG tablet Commonly known as: Protonix  Take 1 tablet (20 mg total) by mouth daily.   sodium bicarbonate  650 MG tablet Take 650 mg by mouth 3 (three) times daily.   tamsulosin  0.4 MG Caps capsule Commonly known as: FLOMAX  Take 0.4 mg by mouth daily.   Venclexta  100 MG tablet Generic drug: venetoclax  Take 3 tablets (300 mg total) by mouth daily.   Vitamin B12 1000 MCG Tbcr Take 1,000 mcg by mouth daily.   Vitamin D3 25 MCG (1000 UT) Caps Take 1 capsule by mouth daily.        Allergies: No Known Allergies  Family History: Family History  Problem Relation Age of Onset   CAD Father 58   Cancer Mother    Alzheimer's disease Mother    Colon cancer Neg Hx     Social History:  reports that he quit smoking about 52 years ago. His smoking use included cigarettes. He started smoking about 67 years ago. He has a 15 pack-year smoking history. He has never used smokeless tobacco. He reports that he does not drink alcohol and does not use drugs.   Physical Exam: BP (!) 92/57   Pulse 93   Constitutional:  Alert and oriented, No acute distress. HEENT: [place] AT, moist mucus membranes.  Trachea midline, no masses. Cardiovascular: No clubbing, cyanosis, or  edema. Respiratory: Normal respiratory effort, no increased work of breathing. GI: Abdomen is soft, nontender, nondistended, no abdominal masses GU: No CVA tenderness Skin: No rashes, bruises or suspicious lesions. Neurologic: Grossly intact, no focal deficits, moving all 4 extremities. Psychiatric: Normal mood and affect.  Laboratory Data: Lab Results  Component Value Date   WBC 2.6 (L) 10/02/2023   HGB 11.8 (L) 10/02/2023   HCT 36.3 (L) 10/02/2023   MCV 102.3 (H) 10/02/2023   PLT 85 (L) 10/02/2023  Lab Results  Component Value Date   CREATININE 3.02 (H) 10/02/2023    Lab Results  Component Value Date   PSA 2.93 09/07/2014    No results found for: "TESTOSTERONE"  No results found for: "HGBA1C"  Urinalysis    Component Value Date/Time   COLORURINE STRAW (A) 12/17/2017 1725   APPEARANCEUR CLEAR 12/17/2017 1725   LABSPEC 1.010 12/17/2017 1725   PHURINE 6.0 12/17/2017 1725   GLUCOSEU NEGATIVE 12/17/2017 1725   HGBUR MODERATE (A) 12/17/2017 1725   BILIRUBINUR NEGATIVE 12/17/2017 1725   KETONESUR NEGATIVE 12/17/2017 1725   PROTEINUR 30 (A) 12/17/2017 1725   NITRITE NEGATIVE 12/17/2017 1725   LEUKOCYTESUR TRACE (A) 12/17/2017 1725    Lab Results  Component Value Date   BACTERIA NONE SEEN 12/17/2017    Pertinent Imaging: CT 2024 images   Results for orders placed during the hospital encounter of 09/11/19  US  RENAL  Narrative CLINICAL DATA:  Chronic kidney disease  EXAM: RENAL / URINARY TRACT ULTRASOUND COMPLETE  COMPARISON:  12/17/2017  FINDINGS: Right Kidney:  Renal measurements: 11.2 x 4.2 x 6.0 cm = volume: 148 mL . Echogenicity within normal limits. No mass or hydronephrosis visualized.  Left Kidney:  Renal measurements: 12.8 x 5.4 x 6.2 cm = volume: 225 mL. Echogenicity within normal limits. 0.9 cm upper pole simple cyst. No solid mass or hydronephrosis visualized.  Bladder:  Appears normal for degree of bladder  distention.  Other:  Prostatomegaly measuring 6.9 x 5.3 x 6.6 cm, volume 128 mL.  IMPRESSION: 1. Negative for obstructive uropathy. 2. Prostatomegaly.   Electronically Signed By: Leverne Reading D.O. On: 09/11/2019 15:13     Assessment & Plan:    1. Elevated PSA (Primary) I had a long discussion with the patient on the nature of elevated PSA - benign vs malignant causes. We discussed age specific levels, PSAD and PSA risk stratification. We discussed the nature risks and benefits of continued surveillance, other lab tests, imaging as well as prostate biopsy. We discussed the management of prostate cancer might include active surveillance or treatment depending on biopsy findings. All questions answered.  2. BPH with LUTS  --he is on tamsulosin  and we discussed its mechanism of action.  He is a good candidate to add 5 alpha reductase inhibitor and we went over nature risk and benefits and FDA warnings.  We could also consider the procedure such as PAE, LEP or AQB. All questions answered. He will start finasteride .  - Urinalysis, Routine w reflex microscopic  3. MH - PET scan will also eval kidneys, bladder and prostate. Will review. F/u cystoscopy.   No follow-ups on file.  Christina Coyer, MD  [facility]  [address] [place], [place] [address] [phone]

## 2023-10-16 ENCOUNTER — Other Ambulatory Visit: Payer: Self-pay

## 2023-10-18 ENCOUNTER — Ambulatory Visit (HOSPITAL_COMMUNITY)
Admission: RE | Admit: 2023-10-18 | Discharge: 2023-10-18 | Disposition: A | Discharge status: Home / Self Care | Source: Ambulatory Visit | Attending: Hematology | Admitting: Hematology

## 2023-10-18 ENCOUNTER — Inpatient Hospital Stay: Admitting: Hematology

## 2023-10-18 DIAGNOSIS — C9110 Chronic lymphocytic leukemia of B-cell type not having achieved remission: Secondary | ICD-10-CM | POA: Diagnosis not present

## 2023-10-18 MED ORDER — FLUDEOXYGLUCOSE F - 18 (FDG) INJECTION
11.1300 | Freq: Once | INTRAVENOUS | Status: AC | PRN
  Administered 2023-10-18: 11.13 via INTRAVENOUS

## 2023-10-25 ENCOUNTER — Inpatient Hospital Stay: Admitting: Hematology

## 2023-10-25 VITALS — BP 124/74 | HR 64 | Temp 97.8°F | Resp 18 | Ht 74.0 in | Wt 226.0 lb

## 2023-10-25 DIAGNOSIS — D696 Thrombocytopenia, unspecified: Secondary | ICD-10-CM | POA: Diagnosis not present

## 2023-10-25 DIAGNOSIS — Z7969 Long term (current) use of other immunomodulators and immunosuppressants: Secondary | ICD-10-CM | POA: Diagnosis not present

## 2023-10-25 DIAGNOSIS — D509 Iron deficiency anemia, unspecified: Secondary | ICD-10-CM | POA: Diagnosis not present

## 2023-10-25 DIAGNOSIS — Z9221 Personal history of antineoplastic chemotherapy: Secondary | ICD-10-CM | POA: Diagnosis not present

## 2023-10-25 DIAGNOSIS — C9110 Chronic lymphocytic leukemia of B-cell type not having achieved remission: Secondary | ICD-10-CM | POA: Diagnosis not present

## 2023-10-25 DIAGNOSIS — Z87891 Personal history of nicotine dependence: Secondary | ICD-10-CM | POA: Diagnosis not present

## 2023-10-25 DIAGNOSIS — D649 Anemia, unspecified: Secondary | ICD-10-CM | POA: Diagnosis not present

## 2023-10-25 DIAGNOSIS — N400 Enlarged prostate without lower urinary tract symptoms: Secondary | ICD-10-CM | POA: Diagnosis not present

## 2023-10-25 DIAGNOSIS — N184 Chronic kidney disease, stage 4 (severe): Secondary | ICD-10-CM | POA: Diagnosis not present

## 2023-10-25 NOTE — Patient Instructions (Addendum)
 [facility] Discharge Instructions   You were seen and examined today by Dr. Cheree Cords.  He reviewed the results of your lab work which are normal/stable.   He reviewed the results of your PET scan which did not show any evidence of cancer.   We will see you back in 6 months. We will repeat lab work prior to this visit.   Return as scheduled.    Thank you for choosing [facility] at [facility] to provide your oncology and hematology care.  To afford each patient quality time with our provider, please arrive at least 15 minutes before your scheduled appointment time.   If you have a lab appointment with the Cancer Center please come in thru the Main Entrance and check in at the main information desk.  You need to re-schedule your appointment should you arrive 10 or more minutes late.  We strive to give you quality time with our providers, and arriving late affects you and other patients whose appointments are after yours.  Also, if you no show three or more times for appointments you may be dismissed from the clinic at the providers discretion.     Again, thank you for choosing [facility].  Our hope is that these requests will decrease the amount of time that you wait before being seen by our physicians.       _____________________________________________________________  Should you have questions after your visit to [facility], please contact our office at [phone] and follow the prompts.  Our office hours are 8:00 a.m. and 4:30 p.m. Monday - Friday.  Please note that voicemails left after 4:00 p.m. may not be returned until the following business day.  We are closed weekends and major holidays.  You do have access to a nurse 24-7, just call the main number to the clinic [phone] and do not press any options, hold on the line and a nurse will answer the phone.    For prescription refill  requests, have your pharmacy contact our office and allow 72 hours.    Due to Covid, you will need to wear a mask upon entering the hospital. If you do not have a mask, a mask will be given to you at the Main Entrance upon arrival. For doctor visits, patients may have 1 support person age 64 or older with them. For treatment visits, patients can not have anyone with them due to social distancing guidelines and our immunocompromised population.

## 2023-10-25 NOTE — Progress Notes (Signed)
 [facility] [address], [place] [address]    Clinic Day:  10/25/23   Referring physician: Omie Bickers, MD  Patient Care Team: Omie Bickers, MD as PCP - General (Internal Medicine) Amanda Jungling Joyceann No, MD as PCP - Cardiology (Cardiology) Vinetta Greening, DO as Consulting Physician (Internal Medicine) Paulett Boros, MD as Consulting Physician (Hematology)   ASSESSMENT & PLAN:   Assessment: 1.  CLL, Rai stage IV: -BMBX on 08/10/2016 shows bone marrow involvement by CLL. -He also had bulky diffuse adenopathy with progressive thrombocytopenia. -Imbruvica  420 mg from 08/19/2016 through May 2018, discontinued due to subdural hematoma on 10/14/2016. -Subsequently offered chlorambucil  and obinutuzumab.  He refused obinutuzumab.  Chlorambucil  30 mg every 2 weeks from June 2019 through 11/07/2018 due to severe weakness. -PET scan on 12/01/2019 showed multiple bilateral enlarged cervical lymph nodes, bilateral axillary and mediastinal adenopathy, prevascular lymph nodes, left lower paratracheal lymph node, minimal adenopathy in the retroperitoneum, external iliac lymph nodes.  Spleen measures 16 cm.  All lymph nodes measure less than 5 cm with SUV less than 5. 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 -T p53 mutation is pending. -Venetoclax  10 mg started on 12/02/2019 (dose reduced secondary to Coreg ), dose increased to 50 mg daily on 12/12/2019. -Dose increased to 100 mg daily on 12/19/2019, held on 12/25/2019 due to elevated creatinine. -Venetoclax  100 mg daily started back on 01/15/2020.  Dose increased to 200 mg daily on 07/01/2020.  Dose increased to 300 mg daily on 09/29/2020. - Rituximab  6 cycles completed on 07/29/2020. - CT CAP on 12/16/2020 showed complete resolution of lymphadenopathy in the chest.  Spleen size is normal.  Subcentimeter lymphadenopathy in the left external iliac region and left inguinal region.   2.  CKD: -Kidney biopsy on 12/19/2017  shows CLL involvement with diffuse severe interstitial nephritis and associated tubular centric granulomatous reaction.   Plan: 1.  CLL, Rai stage IV: - He is tolerating venetoclax  very well. - Reviewed labs from 10/02/2023: Creatinine 3.02.  LFTs are normal.  LDH is normal.  Mild leukopenia and moderate thrombocytopenia are stable without any bleeding issues.  ANC is normal.  No recurrent infections noted. - PET scan (10/18/2023): No abnormal uptake in the lymph nodes or spleen.  Spleen enlarged at 13.1 cm stable but improved from previous PET scan.  Other benign findings were discussed. - We discussed that in trials of venetoclax  with rituximab , venetoclax  was continued up to 2 years.  However in his case, we have improvement in kidney function since venetoclax  was started as he had CLL infiltration of the kidney.  As he is tolerating well, I have recommended that he continue venetoclax  300 mg daily until progression. - RTC 6 months for follow-up with repeat labs.  May consider repeat imaging with CT CAP without contrast once a year.   2.  CKD: - Latest creatinine is 3.02 and stable.  Continue monitoring with Dr. Irene Mannheim.   3.  Normocytic anemia: - Hemoglobin today is 11.8.  Last ferritin was 126 and saturation 23.  Continue iron  tablet 3 times weekly.  Will repeat ferritin and iron  panel in 6 months.  Orders Placed This Encounter  Procedures   CBC with Differential    Standing Status:   Future    Expected Date:   04/21/2024    Expiration Date:   10/24/2024   Comprehensive metabolic panel    Standing Status:   Future    Expected  Date:   04/21/2024    Expiration Date:   10/24/2024   Lactate dehydrogenase    Standing Status:   Future    Expected Date:   04/21/2024    Expiration Date:   10/24/2024   Iron  and TIBC ([facility] ONLY)    Standing Status:   Future    Expected Date:   04/21/2024    Expiration Date:   10/24/2024   Ferritin    Standing Status:   Future     Expected Date:   04/21/2024    Expiration Date:   10/24/2024     Andrew [name],acting as a scribe for Paulett Boros, MD.,have documented all relevant documentation on the behalf of Paulett Boros, MD,as directed by  Paulett Boros, MD while in the presence of Paulett Boros, MD.  I, Paulett Boros MD, have reviewed the above documentation for accuracy and completeness, and I agree with the above.    Paulett Boros, MD   5/29/20254:07 PM  CHIEF COMPLAINT:   Diagnosis: CLL    Cancer Staging  CLL (chronic lymphocytic leukemia) (HCC) Staging form: Chronic Lymphocytic Leukemia / Small Lymphocytic Lymphoma, AJCC 8th Edition - Clinical stage from 09/04/2016: Modified Rai Stage IV (Modified Rai risk: High, Binet: Stage C, Lymphocytosis: Present, Adenopathy: Present, Organomegaly: Present, Anemia: Absent, Thrombocytopenia: Present) - Signed by Doretta Gant, PA-C on 09/04/2016    Prior Therapy: none  Current Therapy:  Venetoclax  300 mg daily    HISTORY OF PRESENT ILLNESS:   Oncology History  CLL (chronic lymphocytic leukemia) (HCC)  03/09/2015 Initial Diagnosis   CLL (chronic lymphocytic leukemia) (HCC)   08/03/2016 Imaging   CT neck- 1. Diffuse and bulky lymphadenopathy in keeping with history of CLL. Nodal enlargement has diffusely progressed since April 2016. 2. Chest and abdomen CT reported separately.   08/03/2016 Imaging   CT CAP- 1. Bulky lymphadenopathy in the chest, abdomen, and pelvis as described. 2. Splenomegaly. 3. Coronary artery and thoracoabdominal aortic atherosclerosis. 4. Prostatomegaly. 5. Several tiny lucent lesions in the bony anatomic pelvis, nonspecific, but attention on follow-up imaging recommended.   08/10/2016 Procedure   Bone marrow aspiration and biopsy by IR.   08/11/2016 Pathology Results   Bone Marrow, Aspirate,Biopsy, and Clot, left iliac crest - MARKED INVOLVEMENT BY CHRONIC LYMPHOCYTIC LEUKEMIA/SMALL  LYMPHOCYTIC LYMPHOMA. PERIPHERAL BLOOD: - CHRONIC LYMPHOCYTIC LEUKEMIA. - THROMBOCYTOPENIA.   08/11/2016 Pathology Results   Bone Marrow Flow Cytometry - CHRONIC LYMPHOCYTIC LEUKEMIA/SMALL LYMPHOCYTIC LYMPHOMA.   08/16/2016 Pathology Results   Normal cytogenetics.  Molecular cytogenetic analysis by FISH demonstrates a 13q-   08/19/2016 -  Chemotherapy   Imbruvica  420 mg daily    03/11/2020 - 07/29/2020 Chemotherapy   Patient is on Treatment Plan : CLL Rituximab  q28d        INTERVAL HISTORY:   Andrew [name] is a 83 y.o. male presenting to clinic today for follow up of CLL. He was last seen by me on 06/05/23.  Since his last visit, he underwent restaging PET on 10/18/23 that found: No specific areas of abnormal radiotracer uptake including lymph nodes and spleen. Deauville category 1. The spleen is slightly enlarged at 13.1 cm in length but similar to the previous CT scan and smaller than the more remote PET-CT. Cardiomegaly. Coronary artery calcifications. Right worse than left severe renal atrophy. Nonobstructing renal stones. Colonic diverticula. Enlarged prostate.  Today, he states that he is doing well overall. His appetite level is at 100%. His energy level is at 50%. Jay has his PSA checked by  his PCP and has had consultation with urology. Urology will follow-up with him in August 2025.   He denies any side effects from venetoclax  and is taking it as prescribed. He has regular follow-up with nephrology. He denies any cardiac issues.  Errick is taking iron  supplements 3 times a week.   PAST MEDICAL HISTORY:   Past Medical History: Past Medical History:  Diagnosis Date   Arthritis    Ascending aortic aneurysm (HCC)    CAD (coronary artery disease)    a. s/p NSTEMI in 04/2023 with DES to LAD   CHF (congestive heart failure) (HCC)    cardiac catheterization in 2007 had revealed LVEF of 35% with no significant coronary disease   CKD (chronic kidney disease), stage IV (HCC)    followed by  [facility]   CLL (chronic lymphocytic leukemia) (HCC)    Complication of anesthesia    "wild when I woke up "   History of kidney stones    passed    Hypertension    PAF (paroxysmal atrial fibrillation) (HCC)    Pinched nerve in neck    Pneumonia    SDH (subdural hematoma) (HCC)    Thrombocytopenia (HCC)     Surgical History: Past Surgical History:  Procedure Laterality Date   AV FISTULA PLACEMENT Left 04/30/2020   Procedure: LEFT ARM BRACHIOCEPHALIC ARTERIOVENOUS (AV) FISTULA CREATION;  Surgeon: Margherita Shell, MD;  Location: [facility] OR;  Service: Vascular;  Laterality: Left;   BIOPSY  08/05/2018   Procedure: BIOPSY;  Surgeon: Alyce Jubilee, MD;  Location: [facility];  Service: Endoscopy;;  gastric    Bone marrow biospy Bilateral    CATARACT EXTRACTION, BILATERAL     COLONOSCOPY  07/10/2011   Procedure: COLONOSCOPY;  Surgeon: Pauleen Borne, MD;  Location: [facility];  Service: Endoscopy;  Laterality: N/A;  10:30 AM   CORONARY IMAGING/OCT N/A 05/04/2023   Procedure: CORONARY IMAGING/OCT;  Surgeon: Knox Perl, MD;  Location: [facility] INVASIVE CV LAB;  Service: Cardiovascular;  Laterality: N/A;   CORONARY PRESSURE/FFR STUDY N/A 05/01/2023   Procedure: CORONARY PRESSURE/FFR STUDY;  Surgeon: Knox Perl, MD;  Location: [facility];  Service: Cardiovascular;  Laterality: N/A;   CORONARY STENT INTERVENTION N/A 05/04/2023   Procedure: CORONARY STENT INTERVENTION;  Surgeon: Knox Perl, MD;  Location: [facility];  Service: Cardiovascular;  Laterality: N/A;   CRANIOTOMY N/A 10/14/2016   Procedure: CRANIOTOMY HEMATOMA EVACUATION SUBDURAL;  Surgeon: Yvonna Herder, MD;  Location: [facility] OR;  Service: Neurosurgery;  Laterality: N/A;  CRANIOTOMY HEMATOMA EVACUATION SUBDURAL   ESOPHAGOGASTRODUODENOSCOPY N/A 08/05/2018   Procedure: ESOPHAGOGASTRODUODENOSCOPY (EGD);  Surgeon: Alyce Jubilee, MD;  Location: [facility];  Service: Endoscopy;  Laterality: N/A;  3:00pm   LEFT HEART CATH AND  CORONARY ANGIOGRAPHY N/A 05/01/2023   Procedure: LEFT HEART CATH AND CORONARY ANGIOGRAPHY;  Surgeon: Knox Perl, MD;  Location: [facility];  Service: Cardiovascular;  Laterality: N/A;   THYROIDECTOMY, PARTIAL     TONSILLECTOMY      Social History: Social History   Socioeconomic History   Marital status: Married    Spouse name: Not on file   Number of children: Not on file   Years of education: Not on file   Highest education level: Not on file  Occupational History   Not on file  Tobacco Use   Smoking status: Former    Current packs/day: 0.00    Average packs/day: 1 pack/day for 15.0 years (15.0 ttl pk-yrs)  Types: Cigarettes    Start date: 01/26/1956    Quit date: 01/26/1971    Years since quitting: 52.7   Smokeless tobacco: Never  Vaping Use   Vaping status: Never Used  Substance and Sexual Activity   Alcohol use: No   Drug use: No   Sexual activity: Not on file  Other Topics Concern   Not on file  Social History Narrative   Not on file   Social Drivers of Health   Financial Resource Strain: Low Risk  (08/30/2020)   Overall Financial Resource Strain (CARDIA)    Difficulty of Paying Living Expenses: Not hard at all  Food Insecurity: No Food Insecurity (04/29/2023)   Hunger Vital Sign    Worried About Running Out of Food in the Last Year: Never true    Ran Out of Food in the Last Year: Never true  Transportation Needs: No Transportation Needs (04/29/2023)   PRAPARE - Administrator, Civil Service (Medical): No    Lack of Transportation (Non-Medical): No  Physical Activity: Inactive (08/30/2020)   Exercise Vital Sign    Days of Exercise per Week: 0 days    Minutes of Exercise per Session: 0 min  Stress: No Stress Concern Present (08/30/2020)   [employer] of Occupational Health - Occupational Stress Questionnaire    Feeling of Stress : Not at all  Social Connections: Moderately Integrated (08/30/2020)   Social Connection and Isolation Panel  [NHANES]    Frequency of Communication with Friends and Family: More than three times a week    Frequency of Social Gatherings with Friends and Family: More than three times a week    Attends Religious Services: More than 4 times per year    Active Member of [employer] or Organizations: No    Attends Banker Meetings: Never    Marital Status: Married  Catering manager Violence: Not At Risk (04/29/2023)   Humiliation, Afraid, Rape, and Kick questionnaire    Fear of Current or Ex-Partner: No    Emotionally Abused: No    Physically Abused: No    Sexually Abused: No    Family History: Family History  Problem Relation Age of Onset   CAD Father 44   Cancer Mother    Alzheimer's disease Mother    Colon cancer Neg Hx     Current Medications:  Current Outpatient Medications:    acetaminophen  (TYLENOL ) 325 MG tablet, Take 650 mg by mouth every 6 (six) hours as needed for mild pain (pain score 1-3)., Disp: , Rfl:    amLODipine  (NORVASC ) 5 MG tablet, Take 5 mg by mouth daily., Disp: , Rfl:    aspirin  EC 81 MG tablet, Take 1 tablet (81 mg total) by mouth daily. Swallow whole., Disp: 30 tablet, Rfl: 12   atorvastatin  (LIPITOR ) 80 MG tablet, Take 1 tablet (80 mg total) by mouth daily., Disp: 90 tablet, Rfl: 0   carvedilol  (COREG ) 12.5 MG tablet, Take 1 tablet (12.5 mg total) by mouth 2 (two) times daily with a meal., Disp: 180 tablet, Rfl: 3   Cholecalciferol  (VITAMIN D3) 25 MCG (1000 UT) CAPS, Take 1 capsule by mouth daily., Disp: , Rfl:    clopidogrel  (PLAVIX ) 75 MG tablet, Take 75 mg by mouth daily., Disp: , Rfl:    Cyanocobalamin  (VITAMIN B12) 1000 MCG TBCR, Take 1,000 mcg by mouth daily. , Disp: , Rfl:    ferrous sulfate  325 (65 FE) MG EC tablet, Take 325 mg by mouth 3 (three) times a  week., Disp: , Rfl:    finasteride  (PROSCAR ) 5 MG tablet, Take 1 tablet (5 mg total) by mouth daily., Disp: 90 tablet, Rfl: 3   furosemide  (LASIX ) 20 MG tablet, Take 1 tablet (20 mg total) by mouth  daily., Disp: 90 tablet, Rfl: 3   hydrALAZINE  (APRESOLINE ) 25 MG tablet, Take 1 tablet (25 mg total) by mouth every 8 (eight) hours., Disp: 240 tablet, Rfl: 0   isosorbide  mononitrate (IMDUR ) 60 MG 24 hr tablet, Take 1 tablet (60 mg total) by mouth daily., Disp: 90 tablet, Rfl: 0   levothyroxine  (SYNTHROID ) 100 MCG tablet, Take 100 mcg by mouth daily., Disp: , Rfl:    Multiple Vitamin (MULITIVITAMIN WITH MINERALS) TABS, Take 1 tablet by mouth daily., Disp: , Rfl:    pantoprazole  (PROTONIX ) 20 MG tablet, Take 1 tablet (20 mg total) by mouth daily., Disp: 90 tablet, Rfl: 3   sodium bicarbonate  650 MG tablet, Take 650 mg by mouth 3 (three) times daily., Disp: , Rfl:    tamsulosin  (FLOMAX ) 0.4 MG CAPS capsule, Take 0.4 mg by mouth daily. , Disp: , Rfl:    venetoclax  (VENCLEXTA ) 100 MG tablet, Take 3 tablets (300 mg total) by mouth daily., Disp: 84 tablet, Rfl: 3   Allergies: No Known Allergies  REVIEW OF SYSTEMS:   Review of Systems  Constitutional:  Negative for chills, fatigue and fever.  HENT:   Negative for lump/mass, mouth sores, nosebleeds, sore throat and trouble swallowing.   Eyes:  Negative for eye problems.  Respiratory:  Negative for cough and shortness of breath.   Cardiovascular:  Negative for chest pain, leg swelling and palpitations.  Gastrointestinal:  Negative for abdominal pain, constipation, diarrhea, nausea and vomiting.  Genitourinary:  Negative for bladder incontinence, difficulty urinating, dysuria, frequency, hematuria and nocturia.   Musculoskeletal:  Negative for arthralgias, back pain, flank pain, myalgias and neck pain.  Skin:  Negative for itching and rash.  Neurological:  Negative for dizziness, headaches and numbness.  Hematological:  Does not bruise/bleed easily.  Psychiatric/Behavioral:  Negative for depression, sleep disturbance and suicidal ideas. The patient is not nervous/anxious.   All other systems reviewed and are negative.    VITALS:   Blood  pressure 124/74, pulse 64, temperature 97.8 F (36.6 C), temperature source Tympanic, resp. rate 18, height 6\' 2"  (1.88 m), weight 226 lb (102.5 kg), SpO2 98%.  Wt Readings from Last 3 Encounters:  10/25/23 226 lb (102.5 kg)  09/10/23 229 lb 6.4 oz (104.1 kg)  08/07/23 233 lb (105.7 kg)    Body mass index is 29.02 kg/m.  Performance status (ECOG): 1 - Symptomatic but completely ambulatory  PHYSICAL EXAM:   Physical Exam Vitals and nursing note reviewed. Exam conducted with a chaperone present.  Constitutional:      Appearance: Normal appearance.  Cardiovascular:     Rate and Rhythm: Normal rate and regular rhythm.     Pulses: Normal pulses.     Heart sounds: Normal heart sounds.  Pulmonary:     Effort: Pulmonary effort is normal.     Breath sounds: Normal breath sounds.  Abdominal:     Palpations: Abdomen is soft. There is no hepatomegaly, splenomegaly or mass.     Tenderness: There is no abdominal tenderness.  Musculoskeletal:     Right lower leg: No edema.     Left lower leg: No edema.  Lymphadenopathy:     Cervical: No cervical adenopathy.     Right cervical: No superficial, deep or posterior cervical adenopathy.  Left cervical: No superficial, deep or posterior cervical adenopathy.     Upper Body:     Right upper body: No supraclavicular or axillary adenopathy.     Left upper body: No supraclavicular or axillary adenopathy.  Neurological:     General: No focal deficit present.     Mental Status: He is alert and oriented to person, place, and time.  Psychiatric:        Mood and Affect: Mood normal.        Behavior: Behavior normal.     LABS:      Latest Ref Rng & Units 10/02/2023    9:55 AM 08/07/2023    2:12 PM 05/29/2023    8:45 AM  CBC  WBC 4.0 - 10.5 K/uL 2.6  2.4  3.4   Hemoglobin 13.0 - 17.0 g/dL 86.5  78.4  69.6   Hematocrit 39.0 - 52.0 % 36.3  32.6  33.9   Platelets 150 - 400 K/uL 85  98  90       Latest Ref Rng & Units 10/02/2023    9:55 AM  08/27/2023    9:12 AM 05/29/2023    8:45 AM  CMP  Glucose 70 - 99 mg/dL 295  284  132   BUN 8 - 23 mg/dL 32  42  38   Creatinine 0.61 - 1.24 mg/dL 4.40  1.02  7.25   Sodium 135 - 145 mmol/L 135  142  138   Potassium 3.5 - 5.1 mmol/L 4.7  4.2  4.3   Chloride 98 - 111 mmol/L 107  107  109   CO2 22 - 32 mmol/L 21  21  22    Calcium  8.9 - 10.3 mg/dL 8.9  8.8  9.0   Total Protein 6.5 - 8.1 g/dL 6.0   6.3   Total Bilirubin 0.0 - 1.2 mg/dL 1.2   1.0   Alkaline Phos 38 - 126 U/L 83   73   AST 15 - 41 U/L 19   19   ALT 0 - 44 U/L 18   19      No results found for: "CEA1", "CEA" / No results found for: "CEA1", "CEA" No results found for: "PSA1" No results found for: "CAN199" No results found for: "CAN125"  Lab Results  Component Value Date   TOTALPROTELP 6.1 11/04/2015   ALBUMINELP 4.1 11/04/2015   A1GS 0.2 11/04/2015   A2GS 0.5 11/04/2015   BETS 0.8 11/04/2015   GAMS 0.4 11/04/2015   MSPIKE Not Observed 11/04/2015   SPEI Comment 11/04/2015   Lab Results  Component Value Date   TIBC 313 05/29/2023   TIBC 324 01/09/2023   TIBC 315 08/31/2022   FERRITIN 126 05/29/2023   FERRITIN 150 01/09/2023   FERRITIN 86 08/31/2022   IRONPCTSAT 23 05/29/2023   IRONPCTSAT 26 01/09/2023   IRONPCTSAT 30 08/31/2022   Lab Results  Component Value Date   LDH 160 10/02/2023   LDH 156 05/29/2023   LDH 149 01/09/2023     STUDIES:   NM PET Image Restag (PS) Skull Base To Thigh Result Date: 10/24/2023 CLINICAL DATA:  Subsequent treatment strategy for CLL. EXAM: NUCLEAR MEDICINE PET SKULL BASE TO THIGH TECHNIQUE: 11.13 mCi F-18 FDG was injected intravenously. Full-ring PET imaging was performed from the skull base to thigh after the radiotracer. CT data was obtained and used for attenuation correction and anatomic localization. Fasting blood glucose: 116 mg/dl COMPARISON:  PET-CT scan 12/01/2019 and older. CT scan chest abdomen pelvis  01/09/2023 and older. FINDINGS: Mediastinal blood pool activity:  SUV max 3.1 Liver activity: SUV max 2.9 NECK: No specific abnormal uptake seen including along lymph node change of the submandibular, posterior triangle or internal jugular region. Near symmetric uptake of the intracranial compartment. The numerous enlarged nodes identified on the remote PET-CT scan have improved. The left-sided node for example posterior to the sternocleidomastoid muscle in the upper neck on the prior study that had short axis dimension of 12 mm, today measures 3 mm in short axis on image 155 of series 201. Incidental CT findings: Preserved brain volume. No midline shift. Right-sided craniotomy changes. The parotid glands, submandibular glands unremarkable. Surgical changes along the right thyroid  lobe. Scattered vascular calcifications. Streak artifact related to patient's dental hardware. Mastoid air cells are grossly clear. There is some mucosal thickening of the left maxillary sinus. CHEST: No specific abnormal radiotracer uptake above blood pool in the axillary regions, hilum or mediastinum. No abnormal lung uptake. Small mediastinal nodes are again seen, unchanged from recent examinations. Incidental CT findings: Coronary calcifications are seen. Heart is slightly enlarged. Trace pericardial fluid. Slightly patulous thoracic esophagus. Significant breathing motion. There is some linear opacity lung bases likely scar or atelectasis. ABDOMEN/PELVIS: There is physiologic distribution radiotracer along the parenchymal organs, bowel and renal collecting systems. Specifically no abnormal uptake along the spleen. On coronal imaging the spleen has a longitudinal length 13.1 cm today. On previous PET-CT 15.8 cm and on the more recent CT scan of August 2024 13.0 cm. No abnormal uptake along lymph node change in the abdomen and pelvis. Prominent left-sided external iliac chain nodes are again seen and similar to the recent previous exam. Node on series 201, image 528 has short axis diameter of 8 mm.  This node is not show any abnormal uptake. Incidental CT findings: Small hiatal hernia. Bilateral severe renal atrophy, right greater left with nonobstructing stones. Enlarged prostate with mass effect along the base of the bladder. Scattered vascular calcifications. Large bowel has a normal course and caliber with scattered stool. Colonic diverticula. Stomach is collapsed. Small bowel is nondilated. Normal appendix. Small fat containing umbilical hernia. There is a loop of transverse colon extending between the liver in the anterior abdominal margin. Benign left hepatic lobe lateral segment hepatic cystic foci. SKELETON: No specific abnormal uptake along the visualized osseous structures. Incidental CT findings: Diffuse degenerative changes along the spine and pelvis. IMPRESSION: No specific areas of abnormal radiotracer uptake including lymph nodes and spleen. Deauville category 1. The spleen is slightly enlarged at 13.1 cm in length but similar to the previous CT scan and smaller than the more remote PET-CT. Cardiomegaly. Coronary artery calcifications. Right worse than left severe renal atrophy. Nonobstructing renal stones. Colonic diverticula. Enlarged prostate. Please correlate with the patient's PSA Electronically Signed   By: Adrianna Horde M.D.   On: 10/24/2023 12:59

## 2023-10-25 NOTE — Progress Notes (Signed)
 Patient is taking venetoclax as prescribed.  He has not missed any doses and reports no side effects at this time.

## 2023-10-30 NOTE — Addendum Note (Signed)
 Addended by: Ashleigh Arya A on: 10/30/2023 01:39 PM   Modules accepted: Orders

## 2023-11-06 ENCOUNTER — Other Ambulatory Visit: Payer: Self-pay

## 2023-11-06 NOTE — Progress Notes (Signed)
 Specialty Pharmacy Ongoing Clinical Assessment Note  Andrew [name] is a 83 y.o. male who is being followed by the specialty pharmacy service for RxSp Oncology   Patient's specialty medication(s) reviewed today: Venetoclax  (VENCLEXTA )   Missed doses in the last 4 weeks: 0   Patient/Caregiver did not have any additional questions or concerns.   Therapeutic benefit summary: Patient is achieving benefit   No data recorded  Patient's therapy is appropriate to: Continue    Goals Addressed             This Visit's Progress    Achieve or maintain remission   On track    Patient is on track. Patient will maintain adherence.  Patient remains stable at this time. Per PET scan on 10/18/23, no abnormal uptake in lymphnodes or spleen and spleen enlarged, but stable from last scan.         Follow up: 6 months  [facility]

## 2023-11-06 NOTE — Progress Notes (Signed)
 Specialty Pharmacy Refill Coordination Note  Andrew [name] is a 83 y.o. male contacted today regarding refills of specialty medication(s) Venetoclax  (VENCLEXTA )   Patient requested Delivery   Delivery date: 11/13/23   Verified address: [address] [place], [place], [place]  [address]   Medication will be filled on 11/12/23.

## 2023-11-12 ENCOUNTER — Other Ambulatory Visit: Payer: Self-pay

## 2023-11-28 ENCOUNTER — Other Ambulatory Visit: Payer: Self-pay

## 2023-11-28 ENCOUNTER — Other Ambulatory Visit: Payer: Self-pay | Admitting: Hematology

## 2023-11-28 ENCOUNTER — Encounter (INDEPENDENT_AMBULATORY_CARE_PROVIDER_SITE_OTHER): Payer: Self-pay

## 2023-11-28 DIAGNOSIS — C9110 Chronic lymphocytic leukemia of B-cell type not having achieved remission: Secondary | ICD-10-CM

## 2023-11-28 MED ORDER — VENETOCLAX 100 MG PO TABS
300.0000 mg | ORAL_TABLET | Freq: Every day | ORAL | 3 refills | Status: DC
  Filled 2023-11-29: qty 84, 28d supply, fill #0
  Filled 2023-12-31: qty 84, 28d supply, fill #1
  Filled 2024-01-24: qty 84, 28d supply, fill #2
  Filled 2024-02-26: qty 84, 28d supply, fill #3

## 2023-11-28 NOTE — Progress Notes (Signed)
 Specialty Pharmacy Refill Coordination Note  Andrew [name] is a 83 y.o. male contacted today regarding refills of specialty medication(s) Venetoclax  (VENCLEXTA )   Patient requested (Patient-Rptd) Delivery   Delivery date: 12/05/23   Verified address: (Patient-Rptd) [address] [place], [place], [place] [address]   Medication will be filled on 12/04/23.

## 2023-11-28 NOTE — Telephone Encounter (Signed)
 Refill for Venclexta  approved.  Patient is tolerating and is to continue therapy at this time.

## 2023-11-29 ENCOUNTER — Other Ambulatory Visit: Payer: Self-pay

## 2023-11-29 ENCOUNTER — Other Ambulatory Visit (HOSPITAL_COMMUNITY): Payer: Self-pay

## 2023-12-03 DIAGNOSIS — R700 Elevated erythrocyte sedimentation rate: Secondary | ICD-10-CM | POA: Diagnosis not present

## 2023-12-10 NOTE — Progress Notes (Unsigned)
 Cardiology Office Note:  .   Date:  12/11/2023  ID:  Andrew [name], DOB 31-Jul-1940, MRN [id number] PCP: Shona Norleen PEDLAR, MD  [facility] Providers Cardiologist:  Alvan Carrier, MD Cardiology APP:  Sheron Lorette GRADE, PA-C {  History of Present Illness: Andrew [name] is a 83 y.o. 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   Last seen in heartcare 08/07/2023 with Laymon Qua, PA-C for 3 month follow up. Reported ongoing intermittent dyspnea with minimal activity and improvement in dyspnea with switching from Brilinta  to Plavix . Follow up labs 07/2023 showed chronic anemia with Hgb at 10.7, Negative d dimer, and elevated BNP at 419. Follow up BMP since starting Lasix  08/27/2023 showed normal electrolytes and improvement in Cr from 3.16 to 2.93.   Today, reports SOB with climbing stairs that resolves with rest. No SOB with flat surfaces.  Still notes improvement in SOB since being off Brilinta . Denies CP, orthopnea, PND, edema, palpitations, syncope, or signs of active bleeding. Reports compliance with medications. Endorse healthy diet. Able to complete house duties, build a patio deck and walk around grocery store without any exertional concerns. Denies tobacco use/Binging ETOH/drug use. Denies any hospitalizations or visits to the emergency department.   ROS: 10 point  review of system has been reviewed and considered negative except ones been listed in the HPI.   Studies Reviewed: SABRA   EKG Interpretation Date/Time:  Tuesday December 11 2023 15:12:02 EDT Ventricular Rate:  63 PR Interval:    QRS Duration:  112 QT Interval:  406 QTC Calculation: 415 R Axis:   107  Text Interpretation: Atrial fibrillation Rightward axis Confirmed by Sheron Lorette ([id number]) on 12/11/2023 3:22:40 PM   Cardiac Catheterization: 04/2023 Left Heart Catheterization 05/01/23: Hemodynamic data: LV 100/-1, EDP 6 mmHg.  Ao 97/67, mean 78 mmHg.  There was no pressure gradient across the aortic valve.   Angiographic data RCA: Very large caliber vessel giving origin to large PDA that arises proximally at the crux and a smaller secondary PDA distally and a moderate to large PL branch.  And PL branch.  Mid RCA at the RV branch has a focal 50 to 60% stenosis, IFR negative for significance (1.0). LM: Large-caliber vessel, smooth and normal. LAD: Diffusely diseased in the ostium and proximal segment, there is a focal 99% stenosis followed by poststenotic dilatation focal up to the origin of D1 which is small to moderate-sized.  Large D2 with secondary branch.  Rest of the LAD has minimal disease. LCx: Very large caliber vessel giving origin to tiny OM1 large OM 2 and continues as a large OM 3.  Mid segment of LCx has a 50% stenosis, not hemodynamically significant (iFR 1.0) and OM2 has a mid 50% stenosis, not hemodynamically significant (iFR 1.0).   Impression and recommendations: Single-vessel proximal LAD high-grade stenosis with large poststenotic dilatation.  Best option would  be to consider single-vessel CABG with LIMA to LAD especially in view of chronic thrombocytopenia and potential for long durable results in view of poststenotic aneurysmal dilatation of the proximal LAD.  54 mL contrast utilized.     Coronary Stent Intervention: 04/2023 Staged coronary intervention 05/04/2023: OCT  guided PTCA and stenting of the ostial/proximal LAD with implantation of a 4.0 x 12 mm Synergy XD DES postdilated with a 4.5 x 8 mm emerge [place], stenosis reduced from 90% to 0% with TIMI-3 to TIMI-3 flow. Minimal luminal area around 2-2.5 preprocedure increase to 11 mm, well opposed stent struts from the ostium to proximal to aneurysmal dilatation.  No free-floating stent struts visualized.   Recommendation: Gentle hydration overnight, follow-up serum creatinine tomorrow, if stable can be discharged home maybe tomorrow afternoon or day after if stable with outpatient follow-up and follow-up of serum creatinine in a week to 2 weeks.   125 mL contrast utilized.   Limited Echo: 06/2023 IMPRESSIONS   1. Left ventricular ejection fraction, by estimation, is 50 to 55%. Left  ventricular ejection fraction by 3D volume is 56 %. The left ventricle has  low normal function. The left ventricle has no regional wall motion  abnormalities. The average left  ventricular global longitudinal strain is -14.8 %. The global longitudinal  strain is abnormal.   2. Left atrial size was severely dilated.   3. Right atrial size was severely dilated.   4. The mitral valve is normal in structure. Mild mitral valve  regurgitation.   5. Tricuspid valve regurgitation is mild to moderate.   6. The aortic valve is tricuspid. Aortic valve regurgitation is mild. No  aortic stenosis is present.   7. Aortic dilatation noted. There is mild dilatation of the ascending  aorta, measuring 43 mm. There is mild dilatation of the aortic root,  measuring 43 mm.   8. Tricuspid regurgitation signal is inadequate for assessing PA  pressure.   Comparison(s): Prior images reviewed side by side. The left ventricular  function has improved. The left ventricular wall motion abnormalities are  improved.  Risk Assessment/Calculations:    CHA2DS2-VASc Score = 5  This indicates a 7.2% annual risk of stroke. The patient's score is based  upon: CHF History: 1 HTN History: 1 Diabetes History: 0 Stroke History: 0 Vascular Disease History: 1 Age Score: 2 Gender Score: 0  Physical Exam:   VS:  BP 122/75 (BP Location: Right Arm, Cuff Size: Normal)   Pulse 63   Ht 6' 2 (1.88 m)   Wt 232 lb (105.2 kg)   SpO2 93%   BMI 29.79 kg/m    Wt Readings from Last 3 Encounters:  12/11/23 232 lb (105.2 kg)  10/25/23 226 lb (102.5 kg)  09/10/23 229 lb 6.4 oz (104.1 kg)    GEN: Well nourished, well developed in no acute distress while sitting in chair.  NECK: No JVD; No carotid bruits CARDIAC: RRR, no murmurs, rubs, gallops RESPIRATORY:  Clear to auscultation without rales, wheezing or rhonchi  ABDOMEN: Soft, non-tender, non-distended EXTREMITIES:  No edema; No deformity   ASSESSMENT AND PLAN: .   Preoperative Cardiovascular Risk Assessment For Colonoscopy that is not scheduled until TBD. GI noted waiting 6 months after DES, which was placed 05/04/2023 before scheduling. Patient is currently outside the 6 month window and can proceed with procedure.  Revised Cardiac Risk Index (RCRI) with 3 point and perioperative risk of major cardiac events is 11%.  Duke Activity Status Index (DASI) with > 4  mets .  From cardiac standpoint, this patient has a low cardiac risk. Therefore, based on ACC/AHA guidelines, patient at acceptable risk for the planned procedure without further cardiovascular testing.  Hold plavix  for 5 days prior to procedure  Will forward note to [facility], PA-C & Dr. Cindie at [facility])   Coronary artery disease involving native coronary artery of native heart without angina pectoris Hyperlipidemia LDL goal <70 DES placement to the LAD in 04/2023. Switched from Brilinta  to Plavix  with improvement in dyspnea.   Reports SOB with climbing stairs that resolves with rest. No SOB with flat surfaces. Still notes improvement in SOB since being off Brilinta . Denies any chest pain.  Discussed signs  and symptoms associated with MI. Advised to contact office or call 911 if any those are present.  04/2023 LDL 102; 09/2023 LFT WNL Continue on ASA 81 mg daily, Plavix  75 mg daily, Atorvastatin  80 mg daily, Imdur  60 mg daily and Coreg  12.5 mg twice daily.   Heart failure with improved ejection fraction (HFimpEF) (HCC) EF 35% in the past with catheterization in 2007 showing minimal CAD, at 55-60% in 2018, EF at 25-30% in 04/2023, EF at 50-55% in 06/2023 Reports SOB with exertion that resolves with rest as above.  Appears euvolemic on exam.  Continue Coreg  12.5 mg twice daily, Hydralazine  25 mg 3 times daily and Imdur  60 mg daily.  Has not been on an ACE-I/ARB/ARNI/MRA/SGLT2 inhibitor due to his stage IV CKD.   Permanent atrial fibrillation (HCC) s/p remote DCCV 15 years prior and not on anticoagulation given his prior subdural hematoma and chronic thrombocytopenia Denies any palpitations.  Continue on Coreg  as above Previously noted, considering Watchman but unsure he will be able to tolerate anticoagulation short-term given his thrombocytopenia.   Essential hypertension BP well controlled in OV today: 122/75 Continue on Coreg , Hydralazine , and Imdur  as above.   Mild dilation of ascending aorta (HCC) ECHO 06/2023: mild dilatation of the ascending aorta and aortic root at 43 mm.  Will plan for repeat ECHO in 07/2023  CKD (chronic kidney disease) stage 4, GFR 15-29 ml/min (HCC) 09/2023 Cr 3.02; 11/2023 3.07 Followed by Dr. Rayburn.   CLL (chronic lymphocytic leukemia) (HCC) Followed by Oncology and remains on Venetoclax .      Dispo: Follow up in 3 months with me.  Signed, Lorette CINDERELLA Kapur, PA-C

## 2023-12-11 ENCOUNTER — Ambulatory Visit: Attending: Student | Admitting: Physician Assistant

## 2023-12-11 ENCOUNTER — Encounter: Payer: Self-pay | Admitting: Physician Assistant

## 2023-12-11 VITALS — BP 122/75 | HR 63 | Ht 74.0 in | Wt 232.0 lb

## 2023-12-11 DIAGNOSIS — N200 Calculus of kidney: Secondary | ICD-10-CM | POA: Diagnosis not present

## 2023-12-11 DIAGNOSIS — I4821 Permanent atrial fibrillation: Secondary | ICD-10-CM | POA: Diagnosis not present

## 2023-12-11 DIAGNOSIS — I10 Essential (primary) hypertension: Secondary | ICD-10-CM

## 2023-12-11 DIAGNOSIS — I2510 Atherosclerotic heart disease of native coronary artery without angina pectoris: Secondary | ICD-10-CM

## 2023-12-11 DIAGNOSIS — E785 Hyperlipidemia, unspecified: Secondary | ICD-10-CM

## 2023-12-11 DIAGNOSIS — N12 Tubulo-interstitial nephritis, not specified as acute or chronic: Secondary | ICD-10-CM | POA: Diagnosis not present

## 2023-12-11 DIAGNOSIS — N179 Acute kidney failure, unspecified: Secondary | ICD-10-CM | POA: Diagnosis not present

## 2023-12-11 DIAGNOSIS — I5032 Chronic diastolic (congestive) heart failure: Secondary | ICD-10-CM | POA: Diagnosis not present

## 2023-12-11 DIAGNOSIS — Z8616 Personal history of COVID-19: Secondary | ICD-10-CM | POA: Diagnosis not present

## 2023-12-11 DIAGNOSIS — N184 Chronic kidney disease, stage 4 (severe): Secondary | ICD-10-CM | POA: Diagnosis not present

## 2023-12-11 DIAGNOSIS — C9110 Chronic lymphocytic leukemia of B-cell type not having achieved remission: Secondary | ICD-10-CM

## 2023-12-11 DIAGNOSIS — I770 Arteriovenous fistula, acquired: Secondary | ICD-10-CM | POA: Diagnosis not present

## 2023-12-11 DIAGNOSIS — D696 Thrombocytopenia, unspecified: Secondary | ICD-10-CM | POA: Diagnosis not present

## 2023-12-11 DIAGNOSIS — I77810 Thoracic aortic ectasia: Secondary | ICD-10-CM

## 2023-12-11 DIAGNOSIS — I129 Hypertensive chronic kidney disease with stage 1 through stage 4 chronic kidney disease, or unspecified chronic kidney disease: Secondary | ICD-10-CM | POA: Diagnosis not present

## 2023-12-11 LAB — LAB REPORT - SCANNED: EGFR: 19

## 2023-12-11 NOTE — Patient Instructions (Signed)
Medication Instructions:  ?Your physician recommends that you continue on your current medications as directed. Please refer to the Current Medication list given to you today. ? ? ?Labwork: ?None today ? ?Testing/Procedures: ?None today ? ?Follow-Up: ?3 months ? ?Any Other Special Instructions Will Be Listed Below (If Applicable). ? ?If you need a refill on your cardiac medications before your next appointment, please call your pharmacy. ? ?

## 2023-12-16 NOTE — Telephone Encounter (Signed)
 Patient has been cleared by cardiology.  Please proceed with scheduling EGD and colonoscopy.  Okay was given to hold Plavix  for 5 days prior.

## 2023-12-19 ENCOUNTER — Encounter: Payer: Self-pay | Admitting: *Deleted

## 2023-12-19 ENCOUNTER — Other Ambulatory Visit: Payer: Self-pay | Admitting: *Deleted

## 2023-12-19 MED ORDER — PEG 3350-KCL-NA BICARB-NACL 420 G PO SOLR: 4000.0000 mL | Freq: Once | ORAL | 0 refills | Status: AC

## 2023-12-19 NOTE — Telephone Encounter (Signed)
 Pt has been scheduled for 01/07/24. Instructions sent via mychart and prep  sent to the pharmacy.

## 2023-12-19 NOTE — Progress Notes (Signed)
 noted

## 2023-12-20 ENCOUNTER — Encounter (HOSPITAL_COMMUNITY): Payer: Self-pay | Admitting: Hematology

## 2023-12-20 NOTE — Progress Notes (Signed)
 Erroneous encounter

## 2023-12-22 DIAGNOSIS — R700 Elevated erythrocyte sedimentation rate: Secondary | ICD-10-CM | POA: Diagnosis not present

## 2023-12-24 ENCOUNTER — Telehealth: Payer: Self-pay | Admitting: *Deleted

## 2023-12-24 NOTE — Telephone Encounter (Signed)
 Noted

## 2023-12-24 NOTE — Telephone Encounter (Signed)
 Pt called to cancel procedure on 01/07/24. Pt says he can't afford it right now.  He states he will call back to reschedule.

## 2023-12-31 ENCOUNTER — Other Ambulatory Visit: Admitting: Urology

## 2023-12-31 ENCOUNTER — Other Ambulatory Visit: Payer: Self-pay

## 2023-12-31 ENCOUNTER — Encounter (INDEPENDENT_AMBULATORY_CARE_PROVIDER_SITE_OTHER): Payer: Self-pay

## 2023-12-31 ENCOUNTER — Other Ambulatory Visit: Payer: Self-pay | Admitting: Pharmacy Technician

## 2023-12-31 NOTE — Progress Notes (Signed)
 Specialty Pharmacy Refill Coordination Note  Andrew [name] is a 83 y.o. male contacted today regarding refills of specialty medication(s) Venetoclax  (VENCLEXTA )   Patient requested (Patient-Rptd) Delivery   Delivery date: 01/02/24 Verified address: (Patient-Rptd) [address] [place], [place], [place]. [address]   Medication will be filled on 01/01/24.

## 2024-01-01 ENCOUNTER — Encounter (HOSPITAL_COMMUNITY): Admission: RE | Admit: 2024-01-01 | Source: Ambulatory Visit

## 2024-01-07 ENCOUNTER — Encounter (HOSPITAL_COMMUNITY): Admission: RE | Payer: Self-pay | Source: Home / Self Care

## 2024-01-07 ENCOUNTER — Ambulatory Visit (HOSPITAL_COMMUNITY): Admission: RE | Admit: 2024-01-07 | Source: Home / Self Care | Admitting: Internal Medicine

## 2024-01-07 SURGERY — COLONOSCOPY
Anesthesia: Choice

## 2024-01-09 DIAGNOSIS — L818 Other specified disorders of pigmentation: Secondary | ICD-10-CM | POA: Diagnosis not present

## 2024-01-24 ENCOUNTER — Other Ambulatory Visit: Payer: Self-pay

## 2024-01-24 NOTE — Progress Notes (Signed)
 Specialty Pharmacy Refill Coordination Note  Andrew [name] is a 83 y.o. male contacted today regarding refills of specialty medication(s) Venetoclax  (VENCLEXTA )   Patient requested Delivery   Delivery date: 02/05/24   Verified address: Patient address [name]  [place] [address]   Medication will be filled on 09.08.25.

## 2024-02-04 ENCOUNTER — Other Ambulatory Visit: Payer: Self-pay

## 2024-02-07 ENCOUNTER — Encounter (HOSPITAL_COMMUNITY): Payer: Self-pay | Admitting: Oncology

## 2024-02-11 ENCOUNTER — Ambulatory Visit: Admitting: Urology

## 2024-02-11 ENCOUNTER — Other Ambulatory Visit: Payer: Self-pay

## 2024-02-11 VITALS — BP 121/66 | HR 66

## 2024-02-11 DIAGNOSIS — R3129 Other microscopic hematuria: Secondary | ICD-10-CM | POA: Diagnosis not present

## 2024-02-11 DIAGNOSIS — R972 Elevated prostate specific antigen [PSA]: Secondary | ICD-10-CM | POA: Diagnosis not present

## 2024-02-11 DIAGNOSIS — N138 Other obstructive and reflux uropathy: Secondary | ICD-10-CM

## 2024-02-11 DIAGNOSIS — N400 Enlarged prostate without lower urinary tract symptoms: Secondary | ICD-10-CM

## 2024-02-11 MED ORDER — CIPROFLOXACIN HCL 500 MG PO TABS
500.0000 mg | ORAL_TABLET | Freq: Once | ORAL | Status: AC
  Administered 2024-02-11: 500 mg via ORAL

## 2024-02-11 NOTE — Progress Notes (Unsigned)
  [place]  02/11/24  CC: No chief complaint on file.   HPI:  Follow-up -     1) BPH - prostate 80 g on CT in 2024. On tamsulosin . May 2025 PET - benign. Prostate similar to 2024 CT. No meds.    2) PSA elevation - Prostate 80 g in 2024. PSA was 4.5 then 4.6 then 5.0 and 6.7 and 7.9 in Mar 2025.  May 2025 PET CT no bone lesions or LAD (Prominent left-sided external iliac chain nodes are again seen and similar to the recent previous exam. No activity).   3) MH - He quit smoking yrs ago but smoked for 16 yrs. No xrt. May 2025 PET CT benign.   4) urolithiasis - R>L renal atrophy noted on 2025 PET CT with small bilateral LP stones. CKD Cr 3.0, gfr 20.  Today, seen for the above for cystoscopy and management of BPH, PSA elevation. He was Rx finasteride  May 2025. Voiding well but with some frequency.   On Imdur  and Plavix . He was a [employer]. On chemo for CLL and had infusions. Saw Dr. MARLA. CKD Cr 3.0, gfr 20. AFIB, CAD.   Blood pressure 121/66, pulse 66. NED. A&Ox3.   No respiratory distress   Abd soft, NT, ND Normal phallus with bilateral descended testicles  Cystoscopy Procedure Note  Patient identification was confirmed, informed consent was obtained, and patient was prepped using Betadine  solution.  Lidocaine  jelly was administered per urethral meatus.     Pre-Procedure: - Inspection reveals a normal caliber ureteral meatus.  Procedure: The flexible cystoscope was introduced without difficulty - No urethral strictures/lesions are present. - obstructing with lateral lobe hypertrophy prostate, anterior IPP about 15 mm - normal bladder neck - Bilateral ureteral orifices identified - Bladder mucosa  reveals no ulcers, tumors, or lesions - No bladder stones - mild trabeculation, a small posterior cellule  Retroflexion shows normal bladder and BN   Post-Procedure: - Patient tolerated the procedure well  Assessment/ Plan:  1) MH - benign eval  2) PSA elevation -  again discussed etiology, management. PSAD nl. See back in 6 weeks. Expect 50% decrease or declining.    3) BPH - again discussed r/b of 5ari. Also procedures. He'll continue finasteride .   Plan -6 weeks with PSA prior.   No follow-ups on file.   Donnice Brooks, MD

## 2024-02-22 ENCOUNTER — Other Ambulatory Visit: Payer: Self-pay

## 2024-02-25 ENCOUNTER — Other Ambulatory Visit: Payer: Self-pay

## 2024-02-25 ENCOUNTER — Encounter (INDEPENDENT_AMBULATORY_CARE_PROVIDER_SITE_OTHER): Payer: Self-pay

## 2024-02-26 ENCOUNTER — Other Ambulatory Visit: Payer: Self-pay | Admitting: Pharmacy Technician

## 2024-02-26 ENCOUNTER — Other Ambulatory Visit: Payer: Self-pay

## 2024-02-26 DIAGNOSIS — Z0001 Encounter for general adult medical examination with abnormal findings: Secondary | ICD-10-CM | POA: Diagnosis not present

## 2024-02-26 DIAGNOSIS — Z23 Encounter for immunization: Secondary | ICD-10-CM | POA: Diagnosis not present

## 2024-02-26 DIAGNOSIS — C9110 Chronic lymphocytic leukemia of B-cell type not having achieved remission: Secondary | ICD-10-CM | POA: Diagnosis not present

## 2024-02-26 DIAGNOSIS — D61818 Other pancytopenia: Secondary | ICD-10-CM | POA: Diagnosis not present

## 2024-02-26 DIAGNOSIS — I5022 Chronic systolic (congestive) heart failure: Secondary | ICD-10-CM | POA: Diagnosis not present

## 2024-02-26 DIAGNOSIS — E782 Mixed hyperlipidemia: Secondary | ICD-10-CM | POA: Diagnosis not present

## 2024-02-26 DIAGNOSIS — I480 Paroxysmal atrial fibrillation: Secondary | ICD-10-CM | POA: Diagnosis not present

## 2024-02-26 DIAGNOSIS — N184 Chronic kidney disease, stage 4 (severe): Secondary | ICD-10-CM | POA: Diagnosis not present

## 2024-02-26 DIAGNOSIS — I130 Hypertensive heart and chronic kidney disease with heart failure and stage 1 through stage 4 chronic kidney disease, or unspecified chronic kidney disease: Secondary | ICD-10-CM | POA: Diagnosis not present

## 2024-02-26 DIAGNOSIS — I252 Old myocardial infarction: Secondary | ICD-10-CM | POA: Diagnosis not present

## 2024-02-26 DIAGNOSIS — E039 Hypothyroidism, unspecified: Secondary | ICD-10-CM | POA: Diagnosis not present

## 2024-02-26 DIAGNOSIS — D649 Anemia, unspecified: Secondary | ICD-10-CM | POA: Diagnosis not present

## 2024-02-26 NOTE — Progress Notes (Signed)
 Specialty Pharmacy Refill Coordination Note  Andrew [name] is a 83 y.o. male contacted today regarding refills of specialty medication(s) Venetoclax  (VENCLEXTA )   Patient requested (Patient-Rptd) Delivery   Delivery date: 03/06/24 Verified address: (Patient-Rptd) [address] [place], [place], [place] [address]   Medication will be filled on 03/05/24.

## 2024-03-05 ENCOUNTER — Other Ambulatory Visit: Payer: Self-pay

## 2024-03-17 DIAGNOSIS — N401 Enlarged prostate with lower urinary tract symptoms: Secondary | ICD-10-CM | POA: Diagnosis not present

## 2024-03-17 DIAGNOSIS — N138 Other obstructive and reflux uropathy: Secondary | ICD-10-CM | POA: Diagnosis not present

## 2024-03-17 DIAGNOSIS — R972 Elevated prostate specific antigen [PSA]: Secondary | ICD-10-CM | POA: Diagnosis not present

## 2024-03-18 ENCOUNTER — Ambulatory Visit: Payer: Self-pay

## 2024-03-18 DIAGNOSIS — Z8616 Personal history of COVID-19: Secondary | ICD-10-CM | POA: Diagnosis not present

## 2024-03-18 DIAGNOSIS — C9110 Chronic lymphocytic leukemia of B-cell type not having achieved remission: Secondary | ICD-10-CM | POA: Diagnosis not present

## 2024-03-18 DIAGNOSIS — N179 Acute kidney failure, unspecified: Secondary | ICD-10-CM | POA: Diagnosis not present

## 2024-03-18 DIAGNOSIS — N184 Chronic kidney disease, stage 4 (severe): Secondary | ICD-10-CM | POA: Diagnosis not present

## 2024-03-18 DIAGNOSIS — D696 Thrombocytopenia, unspecified: Secondary | ICD-10-CM | POA: Diagnosis not present

## 2024-03-18 DIAGNOSIS — I129 Hypertensive chronic kidney disease with stage 1 through stage 4 chronic kidney disease, or unspecified chronic kidney disease: Secondary | ICD-10-CM | POA: Diagnosis not present

## 2024-03-18 DIAGNOSIS — N12 Tubulo-interstitial nephritis, not specified as acute or chronic: Secondary | ICD-10-CM | POA: Diagnosis not present

## 2024-03-18 DIAGNOSIS — N200 Calculus of kidney: Secondary | ICD-10-CM | POA: Diagnosis not present

## 2024-03-18 DIAGNOSIS — I770 Arteriovenous fistula, acquired: Secondary | ICD-10-CM | POA: Diagnosis not present

## 2024-03-18 LAB — PSA: Prostate Specific Ag, Serum: 1.3 ng/mL (ref 0.0–4.0)

## 2024-03-19 LAB — URINALYSIS, ROUTINE W REFLEX MICROSCOPIC
Bilirubin, UA: NEGATIVE
Glucose, UA: NEGATIVE
Ketones, UA: NEGATIVE
Nitrite, UA: NEGATIVE
RBC, UA: NEGATIVE
Specific Gravity, UA: 1.016 (ref 1.005–1.030)
Urobilinogen, Ur: 0.2 mg/dL (ref 0.2–1.0)
pH, UA: 6.5 (ref 5.0–7.5)

## 2024-03-19 LAB — MICROSCOPIC EXAMINATION
Bacteria, UA: NONE SEEN
Casts: NONE SEEN /LPF
Epithelial Cells (non renal): NONE SEEN /HPF (ref 0–10)
RBC, Urine: NONE SEEN /HPF (ref 0–2)

## 2024-03-20 ENCOUNTER — Ambulatory Visit: Admitting: Student

## 2024-03-21 ENCOUNTER — Ambulatory Visit: Admitting: Student

## 2024-03-25 NOTE — Progress Notes (Unsigned)
 Cardiology Office Note    Date:  03/26/2024  ID:  Andrew [name], Andrew [name] May 15, 1941, MRN [id number] PCP:  Shona Norleen PEDLAR, MD  Cardiologist:  Alvan Carrier, MD  Electrophysiologist:  None   Chief Complaint: Follow up for CAD   History of Present Illness: Andrew [name] is a 83 y.o. 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  Patient seen in 07/2023 by Laymon Qua, PA for follow-up.  Reported ongoing intermittent dyspnea with minimal activity and improvement in dyspnea with switching from Brilinta  to Plavix .  BNP was elevated at 419.  Patient was last in clinic by Lorette Kapur, PA on 12/11/2023 for follow-up.  He noted some shortness of breath when climbing stairs that resolved with rest.  Denied dyspnea on exertion when walking flat surfaces.  Today he presents for follow up. He reports that he has been doing well overall.  He denies any chest pain.  Notes that he does still have some dyspnea on exertion when walking for prolonged periods of time, specifically when walking up incline on his typical daily walk, notes that this has been improving in the last few months.  He denies any increased lower extremity edema, orthopnea or PND.  He denies any palpitations, presyncope or syncope.  He reports that he tolerates his medications well.  He notes that his oncologist and nephrologist have both left practice, is now planning to establish with a new oncologist and nephrologist.  Labwork  independently reviewed: 02/18/2024: Hemoglobin 13, potassium 3.9, creatinine 3.12 ROS: .   Today he denies chest pain, lower extremity edema, fatigue, palpitations, melena, hematuria, hemoptysis, diaphoresis, weakness, presyncope, syncope, orthopnea, and PND.  All other systems are reviewed and otherwise negative. Studies Reviewed: SABRA   EKG:  EKG is not ordered today.  CV Studies: Cardiac studies reviewed are outlined and summarized above. Otherwise please see EMR for full report. Cardiac Studies & Procedures   ______________________________________________________________________________________________ CARDIAC CATHETERIZATION  CARDIAC CATHETERIZATION 05/04/2023  Conclusion Images from the original result were not included. Staged coronary intervention 05/04/2023: OCT guided PTCA and stenting of the ostial/proximal LAD with implantation of a 4.0 x 12 mm Synergy XD DES postdilated with a 4.5 x 8 mm emerge [place], stenosis reduced from 90% to 0% with TIMI-3 to TIMI-3 flow. Minimal luminal area around 2-2.5 preprocedure increase to 11 mm, well opposed stent struts from the ostium to proximal to aneurysmal dilatation.  No free-floating stent struts visualized.   Recommendation: Gentle hydration overnight, follow-up serum creatinine tomorrow, if stable can be discharged home maybe tomorrow afternoon or day after if stable with outpatient follow-up and follow-up of serum creatinine in a week to 2 weeks.  125 mL contrast utilized.  Findings Coronary Findings Diagnostic  Dominance: Right  Left Anterior Descending Ost LAD to Prox LAD lesion is 99% stenosed. The lesion is type C. Optical coherence tomography (OCT) was performed. Minimum lumen area: 2 mm. Minimum stent area: 11 mm. Non-stenotic Prox LAD to Mid LAD lesion.  Left Circumflex Mid Cx to Dist Cx  lesion is 50% stenosed.  Second Obtuse Marginal Branch 2nd Mrg lesion is 50% stenosed.  Right Coronary Artery Prox RCA to Mid RCA lesion  is 60% stenosed.  Intervention  Ost LAD to Prox LAD lesion Stent Lesion length:  12 mm. CATH LAUNCHER 6FR EBU3.5 guide catheter was inserted. Lesion crossed with guidewire. A drug-eluting stent was successfully placed using a SYNERGY XD 4.0X12. Maximum pressure: 16 atm. Inflation time: 45 sec. Stent strut is well apposed. Post-stent angioplasty was performed using a [place] EMERGE MR 4.5X8. Maximum pressure:  14 atm. Inflation time:  30 sec. Post-Intervention Lesion Assessment The intervention was successful. Pre-interventional TIMI flow is 3. Post-intervention TIMI flow is 3. No complications occurred at this lesion. There is a 0% residual stenosis post intervention.   CARDIAC CATHETERIZATION 05/01/2023  Conclusion Images from the original result were not included. Left Heart Catheterization 05/01/23: Hemodynamic data: LV 100/-1, EDP 6 mmHg.  Ao 97/67, mean 78 mmHg.  There was no pressure gradient across the aortic valve.  Angiographic data: RCA: Very large caliber vessel giving origin to large PDA that arises proximally at the crux and a smaller secondary PDA distally and a moderate to large PL branch.  And PL branch.  Mid RCA at the RV branch has a focal 50 to 60% stenosis, IFR negative for significance (1.0). LM: Large-caliber vessel, smooth and normal. LAD: Diffusely diseased in the ostium and proximal segment, there is a focal 99% stenosis followed by poststenotic dilatation focal up to the origin of D1 which is small to moderate-sized.  Large D2 with secondary branch.  Rest of the LAD has minimal disease. LCx: Very large caliber vessel giving origin to tiny OM1 large OM 2 and continues as a large OM 3.  Mid segment of LCx has a 50% stenosis, not hemodynamically significant (iFR 1.0) and OM2 has a mid 50% stenosis, not hemodynamically significant (iFR 1.0).    Impression and recommendations: Single-vessel proximal LAD high-grade stenosis with large poststenotic dilatation.  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  54 mL contrast utilized.  Findings Coronary Findings Diagnostic  Dominance: Right  Left Anterior Descending Prox LAD lesion is 99% stenosed. Non-stenotic Prox LAD to Mid LAD lesion.  Left Circumflex Mid Cx to Dist Cx lesion is 50% stenosed.  Second Obtuse Marginal Branch 2nd Mrg lesion is 50% stenosed.  Right Coronary Artery Prox RCA to Mid RCA lesion is 60% stenosed.  Intervention  No interventions have been documented.     ECHOCARDIOGRAM  ECHOCARDIOGRAM LIMITED 07/16/2023  Narrative ECHOCARDIOGRAM LIMITED REPORT    Patient Name:   Andrew [name] Date of Exam: 07/16/2023 Medical Rec #:  [id number]      Height:       74.0 in Accession #:    [id number]     Weight:       226.6 lb Date of Birth:  [birth year]     BSA:          2.292 m Patient Age:    82 years       BP:           100/60 mmHg Patient Gender: M              HR:           66 bpm. Exam Location:  Outpatient  Procedure: Limited  Echo, 3D Echo, Cardiac Doppler, Color Doppler and Strain Analysis (Both Spectral and Color Flow Doppler were utilized during procedure).  Indications:    CHF  History:        Patient has prior history of Echocardiogram examinations, most recent 04/29/2023. Previous Myocardial Infarction, Arrythmias:Atrial Fibrillation; Risk Factors:Former Smoker and Hypertension.  Sonographer:    Orvil Holmes RDCS Referring Phys: [id number] LAYMON CHRISTELLA QUA  IMPRESSIONS   1. Left ventricular ejection fraction, by estimation, is 50 to 55%. Left ventricular ejection fraction by 3D volume is 56 %. The left ventricle has low normal function. The left ventricle has no regional wall motion abnormalities. The average left ventricular global longitudinal strain is -14.8 %. The global  longitudinal strain is abnormal. 2. Left atrial size was severely dilated. 3. Right atrial size was severely dilated. 4. The mitral valve is normal in structure. Mild mitral valve regurgitation. 5. Tricuspid valve regurgitation is mild to moderate. 6. The aortic valve is tricuspid. Aortic valve regurgitation is mild. No aortic stenosis is present. 7. Aortic dilatation noted. There is mild dilatation of the ascending aorta, measuring 43 mm. There is mild dilatation of the aortic root, measuring 43 mm. 8. Tricuspid regurgitation signal is inadequate for assessing PA pressure.  Comparison(s): Prior images reviewed side by side. The left ventricular function has improved. The left ventricular wall motion abnormalities are improved.  FINDINGS Left Ventricle: Left ventricular ejection fraction, by estimation, is 50 to 55%. Left ventricular ejection fraction by 3D volume is 56 %. The left ventricle has low normal function. The left ventricle has no regional wall motion abnormalities. The average left ventricular global longitudinal strain is -14.8 %. Strain was performed and the global longitudinal strain is abnormal. The left ventricular internal cavity size was normal in size. There is no left ventricular hypertrophy.  Right Ventricle: Tricuspid regurgitation signal is inadequate for assessing PA pressure.  Left Atrium: Left atrial size was severely dilated.  Right Atrium: Right atrial size was severely dilated.  Pericardium: There is no evidence of pericardial effusion.  Mitral Valve: The mitral valve is normal in structure. Mild mitral valve regurgitation.  Tricuspid Valve: The tricuspid valve is normal in structure. Tricuspid valve regurgitation is mild to moderate.  Aortic Valve: The aortic valve is tricuspid. Aortic valve regurgitation is mild. Aortic regurgitation PHT measures 774 msec. No aortic stenosis is present. Aortic valve mean gradient measures 6.0 mmHg. Aortic valve peak gradient  measures 11.6 mmHg. Aortic valve area, by VTI measures 1.53 cm.  Pulmonic Valve: The pulmonic valve was grossly normal. Pulmonic valve regurgitation is trivial. No evidence of pulmonic stenosis.  Aorta: Aortic dilatation noted. There is mild dilatation of the ascending aorta, measuring 43 mm. There is mild dilatation of the aortic root, measuring 43 mm.  Venous: The inferior vena cava was not well visualized.  LEFT VENTRICLE PLAX 2D LVIDd:         5.07 cm         Diastology LVIDs:         3.64 cm         LV e' medial:    11.60 cm/s LV PW:         1.51 cm         LV E/e' medial:  9.9 LV IVS:        1.08 cm         LV e' lateral:   13.80 cm/s LVOT diam:     2.10 cm  LV E/e' lateral: 8.3 LV SV:         53 LV SV Index:   23              2D LVOT Area:     3.46 cm        Longitudinal Strain 2D Strain GLS  -14.8 % Avg:  3D Volume EF LV 3D EF:    Left ventricul ar ejection fraction by 3D volume is 56 %.  3D Volume EF: 3D EF:        56 % LV EDV:       171 ml LV ESV:       75 ml LV SV:        96 ml  RIGHT VENTRICLE RV Basal diam:  3.89 cm RV Mid diam:    3.49 cm RV S prime:     15.70 cm/s TAPSE (M-mode): 3.3 cm  LEFT ATRIUM              Index        RIGHT ATRIUM           Index LA diam:        5.70 cm  2.49 cm/m   RA Area:     35.70 cm LA Vol (A2C):   144.0 ml 62.83 ml/m  RA Volume:   135.00 ml 58.90 ml/m LA Vol (A4C):   192.0 ml 83.77 ml/m LA Biplane Vol: 178.0 ml 77.66 ml/m AORTIC VALVE AV Area (Vmax):    1.63 cm AV Area (Vmean):   1.63 cm AV Area (VTI):     1.53 cm AV Vmax:           170.00 cm/s AV Vmean:          111.000 cm/s AV VTI:            0.345 m AV Peak Grad:      11.6 mmHg AV Mean Grad:      6.0 mmHg LVOT Vmax:         80.20 cm/s LVOT Vmean:        52.100 cm/s LVOT VTI:          0.152 m LVOT/AV VTI ratio: 0.44 AI PHT:            774 msec  AORTA Ao Root diam: 4.30 cm  MITRAL VALVE MV Area (PHT): 3.77 cm     SHUNTS MV Decel  Time: 201 msec     Systemic VTI:  0.15 m MV E velocity: 115.00 cm/s  Systemic Diam: 2.10 cm MV A velocity: 30.40 cm/s MV E/A ratio:  3.78  Mihai Croitoru MD Electronically signed by Jerel Balding MD Signature Date/Time: 07/16/2023/2:43:55 PM    Final          ______________________________________________________________________________________________       Current Reported Medications:.    Current Meds  Medication Sig   acetaminophen  (TYLENOL ) 325 MG tablet Take 650 mg by mouth every 6 (six) hours as needed for mild pain (pain score 1-3).   amLODipine  (NORVASC ) 5 MG tablet Take 5 mg by mouth daily.   aspirin  EC 81 MG tablet Take 1 tablet (81 mg total) by mouth daily. Swallow whole.   atorvastatin  (LIPITOR ) 80 MG tablet Take 1 tablet (80 mg total) by mouth daily.   carvedilol  (COREG ) 12.5 MG tablet Take 1 tablet (12.5 mg total) by mouth 2 (two) times daily with a meal.   Cholecalciferol  (VITAMIN D3) 25 MCG (1000 UT) CAPS Take 1 capsule  by mouth daily.   clopidogrel  (PLAVIX ) 75 MG tablet Take 75 mg by mouth daily.   Cyanocobalamin  (VITAMIN B12) 1000 MCG TBCR Take 1,000 mcg by mouth daily.    ferrous sulfate  325 (65 FE) MG EC tablet Take 325 mg by mouth 3 (three) times a week.   finasteride  (PROSCAR ) 5 MG tablet Take 1 tablet (5 mg total) by mouth daily.   furosemide  (LASIX ) 20 MG tablet Take 1 tablet (20 mg total) by mouth daily.   hydrALAZINE  (APRESOLINE ) 25 MG tablet Take 1 tablet (25 mg total) by mouth every 8 (eight) hours.   isosorbide  mononitrate (IMDUR ) 60 MG 24 hr tablet Take 1 tablet (60 mg total) by mouth daily.   levothyroxine  (SYNTHROID ) 88 MCG tablet Take 88 mcg by mouth daily.   Multiple Vitamin (MULITIVITAMIN WITH MINERALS) TABS Take 1 tablet by mouth daily.   pantoprazole  (PROTONIX ) 20 MG tablet Take 1 tablet (20 mg total) by mouth daily.   sodium bicarbonate  650 MG tablet Take 650 mg by mouth 3 (three) times daily.   tamsulosin  (FLOMAX ) 0.4 MG CAPS capsule  Take 0.4 mg by mouth daily.    venetoclax  (VENCLEXTA ) 100 MG tablet Take 3 tablets (300 mg total) by mouth daily.    Physical Exam:    VS:  BP 112/64   Pulse 82   Resp 16   Ht 6' 2 (1.88 m)   Wt 234 lb 6.4 oz (106.3 kg)   SpO2 94%   BMI 30.10 kg/m    Wt Readings from Last 3 Encounters:  03/26/24 234 lb 6.4 oz (106.3 kg)  12/11/23 232 lb (105.2 kg)  10/25/23 226 lb (102.5 kg)    GEN: Well nourished, well developed in no acute distress NECK: No JVD; No carotid bruits CARDIAC: RRR, no murmurs, rubs, gallops RESPIRATORY:  Clear to auscultation without rales, wheezing or rhonchi  ABDOMEN: Soft, non-tender, non-distended EXTREMITIES:  No edema; No acute deformity     Asessement and Plan:.    CAD: Patient with DES placement to the LAD in 04/2023, switch from Brilinta  to Plavix  with improvement in dyspnea. Today he reports that he is doing well, denies any chest pain, notes some ongoing dyspnea on exertion that is improving, possibly related to deconditioning following his NSTEMI last year.  He denies any increased lower extremity edema, orthopnea or PND. Reviewed ED precautions. Continue aspirin  81 mg daily, Plavix  75 mg daily, atorvastatin  80 mg daily, Imdur  60 mg daily and carvedilol  12.5 mg twice daily.  Heart failure with improved EF: EF 35 and 2007 with LHC showing minimal CAD, EF 25 to 30% in 04/2023, recovered on echo in 06/2023 with EF at 50 to 55%. Today he reports improving dyspnea on exertion as noted above, denies increased lower extremity edema, orthopnea or PND.  He appears euvolemic and well compensated on exam. Patient not on ACE/ARB/ARNI/MRA/SGLT2 given CKD stage IV.  Permanent atrial fibrillation: S/p remote DCCV, no longer on anticoagulation given prior subdural hematoma and chronic thrombocytopenia.  Patient denies any increased palpitations or feeling of irregular heartbeats. Continue carvedilol  12.5 mg twice daily.  Hypertension: Blood pressure today 112/64, patient  notes that his blood pressure is running slightly lower today than normal, encouraged patient to continue monitoring at home.  He denies any dizziness, lightheadedness, presyncope or syncope.  Continue amlodipine  5 mg daily, carvedilol  12.5 mg twice daily, Lasix  20 mg daily, Imdur  60 mg daily.  Hyperlipidemia: Last lipid profile 02/18/2024 indicated total cholesterol 114, HDL 31, triglycerides 124 and  LDL 61.  Continue Lipitor  80 mg daily.  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  Planning for repeat echo in 3/25.  CKD stage IV: Last creatinine 3.12 on 02/18/2024.  Followed by nephrology.  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    Disposition: F/u with Dr. Alvan or APP in 3 months or sooner if needed.   Signed, Navie Lamoreaux D Cline Draheim, NP

## 2024-03-26 ENCOUNTER — Other Ambulatory Visit: Payer: Self-pay | Admitting: Hematology

## 2024-03-26 ENCOUNTER — Ambulatory Visit: Attending: Cardiology | Admitting: Cardiology

## 2024-03-26 ENCOUNTER — Encounter: Payer: Self-pay | Admitting: Cardiology

## 2024-03-26 ENCOUNTER — Other Ambulatory Visit: Payer: Self-pay

## 2024-03-26 VITALS — BP 112/64 | HR 82 | Resp 16 | Ht 74.0 in | Wt 234.4 lb

## 2024-03-26 DIAGNOSIS — E785 Hyperlipidemia, unspecified: Secondary | ICD-10-CM | POA: Diagnosis not present

## 2024-03-26 DIAGNOSIS — C9110 Chronic lymphocytic leukemia of B-cell type not having achieved remission: Secondary | ICD-10-CM

## 2024-03-26 DIAGNOSIS — I10 Essential (primary) hypertension: Secondary | ICD-10-CM

## 2024-03-26 DIAGNOSIS — I2510 Atherosclerotic heart disease of native coronary artery without angina pectoris: Secondary | ICD-10-CM

## 2024-03-26 DIAGNOSIS — I4821 Permanent atrial fibrillation: Secondary | ICD-10-CM | POA: Diagnosis not present

## 2024-03-26 DIAGNOSIS — I5020 Unspecified systolic (congestive) heart failure: Secondary | ICD-10-CM | POA: Diagnosis not present

## 2024-03-26 DIAGNOSIS — I77810 Thoracic aortic ectasia: Secondary | ICD-10-CM

## 2024-03-26 DIAGNOSIS — N184 Chronic kidney disease, stage 4 (severe): Secondary | ICD-10-CM | POA: Diagnosis not present

## 2024-03-26 NOTE — Patient Instructions (Signed)
 Medication Instructions:  Your physician recommends that you continue on your current medications as directed. Please refer to the Current Medication list given to you today.  *If you need a refill on your cardiac medications before your next appointment, please call your pharmacy*   Follow-Up: At [facility], you and your health needs are our priority.  As part of our continuing mission to provide you with exceptional heart care, our providers are all part of one team.  This team includes your primary Cardiologist (physician) and Advanced Practice Providers or APPs (Physician Assistants and Nurse Practitioners) who all work together to provide you with the care you need, when you need it.  Your next appointment:   3 month(s)  Provider:   Alvan Carrier, MD in [place] or Katlyn West, NP

## 2024-03-27 ENCOUNTER — Other Ambulatory Visit (HOSPITAL_COMMUNITY): Payer: Self-pay

## 2024-03-27 ENCOUNTER — Other Ambulatory Visit: Payer: Self-pay | Admitting: Oncology

## 2024-03-27 ENCOUNTER — Other Ambulatory Visit: Payer: Self-pay

## 2024-03-27 DIAGNOSIS — C9110 Chronic lymphocytic leukemia of B-cell type not having achieved remission: Secondary | ICD-10-CM

## 2024-03-27 MED ORDER — VENETOCLAX 100 MG PO TABS
300.0000 mg | ORAL_TABLET | Freq: Every day | ORAL | 3 refills | Status: AC
  Filled 2024-03-27 – 2024-03-28 (×2): qty 84, 28d supply, fill #0
  Filled 2024-04-28: qty 120, 40d supply, fill #1
  Filled 2024-05-26: qty 84, 28d supply, fill #2
  Filled 2024-06-17 – 2024-06-18 (×2): qty 84, 28d supply, fill #3

## 2024-03-27 NOTE — Telephone Encounter (Signed)
Chart reviewed. Venetoclax refilled per last office note with Dr. Ellin Saba.

## 2024-03-28 ENCOUNTER — Other Ambulatory Visit: Payer: Self-pay

## 2024-03-28 NOTE — Progress Notes (Signed)
 Specialty Pharmacy Refill Coordination Note  Andrew [name] is a 83 y.o. male contacted today regarding refills of specialty medication(s) Venetoclax  (VENCLEXTA )   Patient requested Delivery   Delivery date: 04/04/24   Verified address: [address], [place], [place] [address]   Medication will be filled on: 04/03/24

## 2024-04-03 ENCOUNTER — Other Ambulatory Visit: Payer: Self-pay

## 2024-04-04 ENCOUNTER — Telehealth: Payer: Self-pay | Admitting: Pharmacy Technician

## 2024-04-04 NOTE — Telephone Encounter (Signed)
 Oral Oncology Patient Advocate Encounter  Was successful in securing patient a $8000 grant from [facility] to provide copayment coverage for Venclexta .  This will keep the out of pocket expense at $0.     [employer] ID: [id number]   The billing information is as follows and has been shared with [facility].    RxBin: [id number] PCN: [id number] Member ID: [id number] Group ID: [id number] Dates of Eligibility: 03/23/2024 through 03/22/2025  Fund:  Chronic Lymphocytic Leukemia  Andres Escandon (Patty) Chet Burnet, CPhT  [facility], Zelda Salmon, [facility] - Oral Chemotherapy Patient Advocate Specialist III Phone: [phone]  Fax: [phone]

## 2024-04-21 ENCOUNTER — Inpatient Hospital Stay: Attending: Physician Assistant

## 2024-04-21 ENCOUNTER — Other Ambulatory Visit

## 2024-04-21 DIAGNOSIS — D631 Anemia in chronic kidney disease: Secondary | ICD-10-CM | POA: Insufficient documentation

## 2024-04-21 DIAGNOSIS — N184 Chronic kidney disease, stage 4 (severe): Secondary | ICD-10-CM | POA: Insufficient documentation

## 2024-04-21 DIAGNOSIS — C9110 Chronic lymphocytic leukemia of B-cell type not having achieved remission: Secondary | ICD-10-CM | POA: Insufficient documentation

## 2024-04-21 DIAGNOSIS — D509 Iron deficiency anemia, unspecified: Secondary | ICD-10-CM

## 2024-04-21 LAB — COMPREHENSIVE METABOLIC PANEL WITH GFR
ALT: 20 U/L (ref 0–44)
AST: 23 U/L (ref 15–41)
Albumin: 4.1 g/dL (ref 3.5–5.0)
Alkaline Phosphatase: 93 U/L (ref 38–126)
Anion gap: 7 (ref 5–15)
BUN: 38 mg/dL — ABNORMAL HIGH (ref 8–23)
CO2: 26 mmol/L (ref 22–32)
Calcium: 8.9 mg/dL (ref 8.9–10.3)
Chloride: 106 mmol/L (ref 98–111)
Creatinine, Ser: 2.97 mg/dL — ABNORMAL HIGH (ref 0.61–1.24)
GFR, Estimated: 20 mL/min — ABNORMAL LOW (ref >60)
Glucose, Bld: 112 mg/dL — ABNORMAL HIGH (ref 70–99)
Potassium: 4.2 mmol/L (ref 3.5–5.1)
Sodium: 140 mmol/L (ref 135–145)
Total Bilirubin: 0.9 mg/dL (ref 0.0–1.2)
Total Protein: 5.8 g/dL — ABNORMAL LOW (ref 6.5–8.1)

## 2024-04-21 LAB — CBC WITH DIFFERENTIAL/PLATELET
Abs Immature Granulocytes: 0.01 K/uL (ref 0.00–0.07)
Basophils Absolute: 0 K/uL (ref 0.0–0.1)
Basophils Relative: 0 %
Eosinophils Absolute: 0 K/uL (ref 0.0–0.5)
Eosinophils Relative: 1 %
HCT: 37.8 % — ABNORMAL LOW (ref 39.0–52.0)
Hemoglobin: 13 g/dL (ref 13.0–17.0)
Immature Granulocytes: 0 %
Lymphocytes Relative: 15 %
Lymphs Abs: 0.4 K/uL — ABNORMAL LOW (ref 0.7–4.0)
MCH: 35.5 pg — ABNORMAL HIGH (ref 26.0–34.0)
MCHC: 34.4 g/dL (ref 30.0–36.0)
MCV: 103.3 fL — ABNORMAL HIGH (ref 80.0–100.0)
Monocytes Absolute: 0.7 K/uL (ref 0.1–1.0)
Monocytes Relative: 26 %
Neutro Abs: 1.4 K/uL — ABNORMAL LOW (ref 1.7–7.7)
Neutrophils Relative %: 58 %
Platelets: 67 K/uL — ABNORMAL LOW (ref 150–400)
RBC: 3.66 MIL/uL — ABNORMAL LOW (ref 4.22–5.81)
RDW: 13.3 % (ref 11.5–15.5)
WBC: 2.5 K/uL — ABNORMAL LOW (ref 4.0–10.5)
nRBC: 0 % (ref 0.0–0.2)

## 2024-04-21 LAB — IRON AND TIBC
Iron: 51 ug/dL (ref 45–182)
Saturation Ratios: 15 % — ABNORMAL LOW (ref 17.9–39.5)
TIBC: 332 ug/dL (ref 250–450)
UIBC: 280 ug/dL

## 2024-04-21 LAB — LACTATE DEHYDROGENASE: LDH: 225 U/L (ref 105–235)

## 2024-04-21 LAB — FERRITIN: Ferritin: 165 ng/mL (ref 24–336)

## 2024-04-21 NOTE — Addendum Note (Signed)
 Addended by: Obediah Welles A on: 04/21/2024 10:26 AM   Modules accepted: Orders

## 2024-04-25 ENCOUNTER — Other Ambulatory Visit (HOSPITAL_COMMUNITY): Payer: Self-pay

## 2024-04-25 NOTE — Progress Notes (Signed)
 [facility] [address] [place], [place] [address]   CLINIC:  Medical Oncology/Hematology  PCP:  Shona Norleen PEDLAR, MD [address] [place] [address] [phone]   REASON FOR VISIT:  Follow-up for CLL, Rai stage IV  PRIOR THERAPY:  - Imbruvica  420 mg from 08/19/2016 through May 2018, discontinued due to subdural hematoma on 10/14/2016 - Chlorambucil  30 mg every 2 weeks from June 2019 through 11/07/2018 due to severe weakness - Venetoclax  12/02/2019 through 12/25/2019 (restarted 01/15/2020) - Rituximab  x 6 cycles completed on 07/29/2020  CURRENT THERAPY: Venetoclax  300 mg daily  BRIEF ONCOLOGIC HISTORY:   Oncology History  CLL (chronic lymphocytic leukemia) (HCC)  03/09/2015 Initial Diagnosis   CLL (chronic lymphocytic leukemia) (HCC)   08/03/2016 Imaging   CT neck- 1. Diffuse and bulky lymphadenopathy in keeping with history of CLL. Nodal enlargement has diffusely progressed since April 2016. 2. Chest and abdomen CT reported separately.   08/03/2016 Imaging   CT CAP- 1. Bulky lymphadenopathy in the chest, abdomen, and pelvis as described. 2. Splenomegaly. 3. Coronary artery and thoracoabdominal aortic atherosclerosis. 4. Prostatomegaly. 5. Several tiny lucent lesions in the bony anatomic pelvis, nonspecific, but attention on follow-up imaging recommended.   08/10/2016 Procedure   Bone marrow aspiration and biopsy by IR.   08/11/2016 Pathology Results   Bone Marrow, Aspirate,Biopsy, and Clot, left iliac crest - MARKED INVOLVEMENT BY CHRONIC LYMPHOCYTIC LEUKEMIA/SMALL LYMPHOCYTIC LYMPHOMA. PERIPHERAL BLOOD: - CHRONIC LYMPHOCYTIC LEUKEMIA. - THROMBOCYTOPENIA.   08/11/2016 Pathology Results   Bone Marrow Flow Cytometry - CHRONIC LYMPHOCYTIC LEUKEMIA/SMALL LYMPHOCYTIC LYMPHOMA.   08/16/2016 Pathology Results   Normal cytogenetics.  Molecular cytogenetic analysis by FISH demonstrates a 13q-   08/19/2016 -  Chemotherapy   Imbruvica  420 mg daily     03/11/2020 - 07/29/2020 Chemotherapy   Patient is on Treatment Plan : CLL Rituximab  q28d       CANCER STAGING:  Cancer Staging  CLL (chronic lymphocytic leukemia) (HCC) Staging form: Chronic Lymphocytic Leukemia / Small Lymphocytic Lymphoma, AJCC 8th Edition - Clinical stage from 09/04/2016: Modified Rai Stage IV (Modified Rai risk: High, Binet: Stage C, Lymphocytosis: Present, Adenopathy: Present, Organomegaly: Present, Anemia: Absent, Thrombocytopenia: Present) - Signed by Berry Debby RAMAN, PA-C on 09/04/2016   INTERVAL HISTORY:   Andrew [name] 83 y.o. male returns for routine follow-up of his stage IV CLL. He was last seen by Dr. Rogers on 10/25/2023.  He  denies any recent surgeries, hospitalizations, or changes in baseline health status.   At today's visit, he reports feeling fair, but with some progressive weakness and fatigue with age.   He  reports 50% energy and 100% appetite.   He  is maintaining stable weight at this time.  He denies any new lumps or bumps.   He  has not had any fever, chills, or unintentional weight loss.  He has occasional non-drenching night sweats. He  has not had any extreme or debilitating fatigue.   No recurrent infections. No early satiety or abdominal pain.   He  denies any signs or symptoms of blood loss.   No chest pain, syncope, or palpitations.   He has lightheadedness with standing, which he attributes to low blood pressure, possibly from medications. He notes dyspnea on exertion, following with cardiology.  Patient is taking venetoclax  300 mg daily.   He denies any GI side effects such as diarrhea or nausea.  He has not started any new medications since last visit.  ASSESSMENT & PLAN:  1.  CLL, Rai Stage IV - Presented with bulky diffuse adenopathy and progressive thrombocytopenia - BMBX on 08/10/2016 shows bone marrow involvement by CLL. - Imbruvica  420 mg from 08/19/2016 through May 2018, discontinued due to subdural hematoma on  10/14/2016. - Subsequently offered chlorambucil  and obinutuzumab.  He refused obinutuzumab.  Chlorambucil  30 mg every 2 weeks from June 2019 through 11/07/2018 due to severe weakness. - PET scan on 12/01/2019 showed multiple bilateral enlarged cervical lymph nodes, bilateral axillary and mediastinal adenopathy, prevascular lymph nodes, left lower paratracheal lymph node, minimal adenopathy in the retroperitoneum, external iliac lymph nodes.  Spleen measures 16 cm.  All lymph nodes measure less than 5 cm with SUV less than 5. - FISH panel for CLL showed 13 q deletion.  IGHV was mutated.  T p53 negative. - Venetoclax  10 mg started on 12/02/2019 (dose reduced secondary to Coreg ), dose increased to 50 mg daily on 12/12/2019. - Dose increased to 100 mg daily on 12/19/2019, held on 12/25/2019 due to elevated creatinine. - Venetoclax  100 mg daily started back on 01/15/2020.  Dose increased to 200 mg daily on 07/01/2020.  Dose increased to 300 mg daily on 09/29/2020. - Rituximab  6 cycles completed on 07/29/2020. - CT CAP on 12/16/2020 showed complete resolution of lymphadenopathy in the chest.  Spleen size is normal.  Subcentimeter lymphadenopathy in the left external iliac region and left inguinal region. - PET scan (10/18/2023): No abnormal uptake in the lymph nodes or spleen.  Spleen enlarged at 13.1 cm stable but improved from previous PET scan.  Other benign findings were discussed. - Per discussion with Dr. Rogers (supervising oncologist) at visit on 10/25/2023: We discussed that in trials of venetoclax  with rituximab , venetoclax  was continued up to 2 years.  However in his case, we have improvement in kidney function since venetoclax  was started as he had CLL infiltration of the kidney.  As he is tolerating well, I have recommended that he continue venetoclax  300 mg daily until progression. - Most recent labs (04/21/2024): WBC 2.5 (overall within baseline range over the past several years), ANC 1.4 with ALC  0.4 Platelets 67 (baseline range over the past year with platelets 70-90) Normal hemoglobin 13.0 Normal LDH Creatinine 2.97/GFR 20, overall baseline CKD stage IV - Current treatment = Venetoclax  300 mg daily.  Currently tolerating venetoclax  very well.   - Denies any B symptoms, apart from occasional nondrenching night sweats. - No bleeding issues or recurrent infections.   - PHYSICAL EXAM (04/28/2024): No lymphadenopathy of head/neck, axillary, inguinal regions.  No palpable splenomegaly. - PLAN: RTC in 6 months for repeat labs and follow-up visit. - Continue venetoclax  300 mg daily until progression, as evidenced by rising lymphocyte count, enlargement of lymph nodes/spleen, progressive systemic symptoms, and/or worsening cytopenias. Common side effects include neutropenia, tumor lysis syndrome, GI side effects (diarrhea and nausea), anemia, and thrombocytopenia Significant drug interactions with strong CYP3A and P-glycoprotein inhibitors - Annual CT CAP without contrast to be done around 10/17/2024.   - Recommend alternating MD/APP visits in patient on active cancer treatment.    2.  CKD stage IV - Kidney biopsy on 12/19/2017 shows CLL involvement with diffuse severe interstitial nephritis and associated tubular centric granulomatous reaction. - Last creatinine is 2.97, overall stable - PLAN: Continue nephrology follow-up with Dr. Rayburn  3.  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 - Denies any bleeding issues. - Most recent labs (04/21/2024): Hgb 13.0/MCV 103.3, ferritin  165, iron  saturation 15% - PLAN: Continue iron  tablet 3 times weekly.   4.  Marginal hypotension - BP today at 91/52 in office - Currently asymptomatic, reports that blood pressure has been a bit lower lately and that he occasionally gets lightheaded with standing - He is on multiple antihypertensives as prescribed by PCP and cardiology - PLAN: Patient  advised to recheck BP at home and proceed to emergency department if blood pressure < 90/50.  Advised to contact primary care provider and/your cardiologist ASAP for instructions regarding blood pressure medications.  Advised to proceed to ED if symptomatic.  5.  Other history - Pertinent medical history: Coronary artery disease: Admitted for NSTEMI in December 2024, underwent LHC and coronary angiography with severe single-vessel proximal LAD stenosis of 99% with large poststenotic dilation.  PCI to LAD on 05/04/2023. Congestive heart failure CKD stage IV, followed by Dr. Rayburn of [facility] Paroxysmal atrial fibrillation History of subdural hematoma - Former smoker (15-pack-year history, quit in 1972) - Denies alcohol or drug use.  PLAN SUMMARY: >> CT CAP around 10/17/2024 >> Labs in 6 months = CBC/D, CMP, LDH, ferritin, iron /TIBC >> MD VISIT with DR. KANDALA in 6 months (at least 1 week after labs/CT scan) **Alternating MD/APP visits, pending MD evaluation    REVIEW OF SYSTEMS:   Review of Systems  Constitutional:  Positive for fatigue. Negative for appetite change, chills, diaphoresis, fever and unexpected weight change.  HENT:   Negative for lump/mass and nosebleeds.   Eyes:  Negative for eye problems.  Respiratory:  Positive for shortness of breath. Negative for cough and hemoptysis.   Cardiovascular:  Negative for chest pain, leg swelling and palpitations.  Gastrointestinal:  Negative for abdominal pain, blood in stool, constipation, diarrhea, nausea and vomiting.  Genitourinary:  Negative for hematuria.   Skin: Negative.   Neurological:  Positive for numbness. Negative for dizziness, headaches and light-headedness.  Hematological:  Does not bruise/bleed easily.  Psychiatric/Behavioral:  Positive for sleep disturbance.     PHYSICAL EXAM:   Performance status (ECOG): 1 - Symptomatic but completely ambulatory  Vitals:   04/28/24 0816 04/28/24 0910   BP: (!) 102/58 (!) 91/52  Pulse:    Resp:    Temp:    SpO2:     Wt Readings from Last 3 Encounters:  04/28/24 235 lb 14.3 oz (107 kg)  03/26/24 234 lb 6.4 oz (106.3 kg)  12/11/23 232 lb (105.2 kg)   Physical Exam Constitutional:      Appearance: Normal appearance. He is obese.  Cardiovascular:     Heart sounds: Normal heart sounds.  Pulmonary:     Breath sounds: Rales present.  Lymphadenopathy:     Comments: No lymphadenopathy of head, neck, axillary, or inguinal regions.  No palpable splenomegaly.  Neurological:     General: No focal deficit present.     Mental Status: Mental status is at baseline.  Psychiatric:        Behavior: Behavior normal. Behavior is cooperative.      PAST MEDICAL/SURGICAL HISTORY:  Past Medical History:  Diagnosis Date   Arthritis    Ascending aortic aneurysm    CAD (coronary artery disease)    a. s/p NSTEMI in 04/2023 with DES to LAD   CHF (congestive heart failure) (HCC)    cardiac catheterization in 2007 had revealed LVEF of 35% with no significant coronary disease   CKD (chronic kidney disease), stage IV (HCC)    followed by [facility]   CLL (chronic  lymphocytic leukemia) (HCC)    Complication of anesthesia    wild when I woke up    History of kidney stones    passed    Hypertension    PAF (paroxysmal atrial fibrillation) (HCC)    Pinched nerve in neck    Pneumonia    SDH (subdural hematoma) (HCC)    Thrombocytopenia    Past Surgical History:  Procedure Laterality Date   AV FISTULA PLACEMENT Left 04/30/2020   Procedure: LEFT ARM BRACHIOCEPHALIC ARTERIOVENOUS (AV) FISTULA CREATION;  Surgeon: Serene Gaile ORN, MD;  Location: [facility] OR;  Service: Vascular;  Laterality: Left;   BIOPSY  08/05/2018   Procedure: BIOPSY;  Surgeon: Harvey Margo CROME, MD;  Location: [facility] SUITE;  Service: Endoscopy;;  gastric    Bone marrow biospy Bilateral    CATARACT EXTRACTION, BILATERAL     COLONOSCOPY  07/10/2011   Procedure: COLONOSCOPY;   Surgeon: Margo CHRISTELLA Harvey, MD;  Location: [facility] SUITE;  Service: Endoscopy;  Laterality: N/A;  10:30 AM   CORONARY IMAGING/OCT N/A 05/04/2023   Procedure: CORONARY IMAGING/OCT;  Surgeon: Ladona Heinz, MD;  Location: [facility] INVASIVE CV LAB;  Service: Cardiovascular;  Laterality: N/A;   CORONARY PRESSURE/FFR STUDY N/A 05/01/2023   Procedure: CORONARY PRESSURE/FFR STUDY;  Surgeon: Ladona Heinz, MD;  Location: [facility] INVASIVE CV LAB;  Service: Cardiovascular;  Laterality: N/A;   CORONARY STENT INTERVENTION N/A 05/04/2023   Procedure: CORONARY STENT INTERVENTION;  Surgeon: Ladona Heinz, MD;  Location: [facility] INVASIVE CV LAB;  Service: Cardiovascular;  Laterality: N/A;   CRANIOTOMY N/A 10/14/2016   Procedure: CRANIOTOMY HEMATOMA EVACUATION SUBDURAL;  Surgeon: Alix Charleston, MD;  Location: [facility] OR;  Service: Neurosurgery;  Laterality: N/A;  CRANIOTOMY HEMATOMA EVACUATION SUBDURAL   ESOPHAGOGASTRODUODENOSCOPY N/A 08/05/2018   Procedure: ESOPHAGOGASTRODUODENOSCOPY (EGD);  Surgeon: Harvey Margo CROME, MD;  [facility]: [facility];  Service: Endoscopy;  Laterality: N/A;  3:00pm   LEFT HEART CATH AND CORONARY ANGIOGRAPHY N/A 05/01/2023   Procedure: LEFT HEART CATH AND CORONARY ANGIOGRAPHY;  Surgeon: Ladona Heinz, MD;  Location: [facility] INVASIVE CV LAB;  Service: Cardiovascular;  Laterality: N/A;   THYROIDECTOMY, PARTIAL     TONSILLECTOMY      SOCIAL HISTORY:  Social History   Socioeconomic History   Marital status: Married    Spouse name: Not on file   Number of children: Not on file   Years of education: Not on file   Highest education level: Not on file  Occupational History   Not on file  Tobacco Use   Smoking status: Former    Current packs/day: 0.00    Average packs/day: 1 pack/day for 15.0 years (15.0 ttl pk-yrs)    Types: Cigarettes    Start date: 01/26/1956    Quit date: 01/26/1971    Years since quitting: 53.2   Smokeless tobacco: Never  Vaping Use   Vaping status: Never Used  Substance and Sexual Activity    Alcohol use: No   Drug use: No   Sexual activity: Not on file  Other Topics Concern   Not on file  Social History Narrative   Not on file   Social Drivers of Health   Financial Resource Strain: Low Risk  (08/30/2020)   Overall Financial Resource Strain (CARDIA)    Difficulty of Paying Living Expenses: Not hard at all  Food Insecurity: No Food Insecurity (04/29/2023)   Hunger Vital Sign    Worried About Running Out of Food in the Last Year: Never true    Ran Out  of Food in the Last Year: Never true  Transportation Needs: No Transportation Needs (04/29/2023)   PRAPARE - Administrator, Civil Service (Medical): No    Lack of Transportation (Non-Medical): No  Physical Activity: Inactive (08/30/2020)   Exercise Vital Sign    Days of Exercise per Week: 0 days    Minutes of Exercise per Session: 0 min  Stress: No Stress Concern Present (08/30/2020)   [employer] of Occupational Health - Occupational Stress Questionnaire    Feeling of Stress : Not at all  Social Connections: Moderately Integrated (08/30/2020)   Social Connection and Isolation Panel    Frequency of Communication with Friends and Family: More than three times a week    Frequency of Social Gatherings with Friends and Family: More than three times a week    Attends Religious Services: More than 4 times per year    Active Member of [employer] or Organizations: No    Attends Banker Meetings: Never    Marital Status: Married  Catering Manager Violence: Not At Risk (04/29/2023)   Humiliation, Afraid, Rape, and Kick questionnaire    Fear of Current or Ex-Partner: No    Emotionally Abused: No    Physically Abused: No    Sexually Abused: No    FAMILY HISTORY:  Family History  Problem Relation Age of Onset   CAD Father 41   Cancer Mother    Alzheimer's disease Mother    Colon cancer Neg Hx     CURRENT MEDICATIONS:  Current Outpatient Medications  Medication Sig Dispense Refill   acetaminophen   (TYLENOL ) 325 MG tablet Take 650 mg by mouth every 6 (six) hours as needed for mild pain (pain score 1-3).     amLODipine  (NORVASC ) 5 MG tablet Take 5 mg by mouth daily.     aspirin  EC 81 MG tablet Take 1 tablet (81 mg total) by mouth daily. Swallow whole. 30 tablet 12   atorvastatin  (LIPITOR ) 80 MG tablet Take 1 tablet (80 mg total) by mouth daily. 90 tablet 0   carvedilol  (COREG ) 12.5 MG tablet Take 1 tablet (12.5 mg total) by mouth 2 (two) times daily with a meal. 180 tablet 3   Cholecalciferol  (VITAMIN D3) 25 MCG (1000 UT) CAPS Take 1 capsule by mouth daily.     clopidogrel  (PLAVIX ) 75 MG tablet Take 75 mg by mouth daily.     Cyanocobalamin  (VITAMIN B12) 1000 MCG TBCR Take 1,000 mcg by mouth daily.      ferrous sulfate  325 (65 FE) MG EC tablet Take 325 mg by mouth 3 (three) times a week.     finasteride  (PROSCAR ) 5 MG tablet Take 1 tablet (5 mg total) by mouth daily. 90 tablet 3   furosemide  (LASIX ) 20 MG tablet Take 1 tablet (20 mg total) by mouth daily. 90 tablet 3   hydrALAZINE  (APRESOLINE ) 25 MG tablet Take 1 tablet (25 mg total) by mouth every 8 (eight) hours. 240 tablet 0   isosorbide  mononitrate (IMDUR ) 60 MG 24 hr tablet Take 1 tablet (60 mg total) by mouth daily. 90 tablet 0   levothyroxine  (SYNTHROID ) 88 MCG tablet Take 88 mcg by mouth daily.     Multiple Vitamin (MULITIVITAMIN WITH MINERALS) TABS Take 1 tablet by mouth daily.     pantoprazole  (PROTONIX ) 20 MG tablet Take 1 tablet (20 mg total) by mouth daily. 90 tablet 3   sodium bicarbonate  650 MG tablet Take 650 mg by mouth 3 (three) times daily.  tamsulosin  (FLOMAX ) 0.4 MG CAPS capsule Take 0.4 mg by mouth daily.      venetoclax  (VENCLEXTA ) 100 MG tablet Take 3 tablets (300 mg total) by mouth daily. 84 tablet 3   No current facility-administered medications for this visit.    ALLERGIES:  No Known Allergies  LABORATORY DATA:  I have reviewed the labs as listed.     Latest Ref Rng & Units 04/21/2024   10:46 AM  10/02/2023    9:55 AM 08/07/2023    2:12 PM  CBC  WBC 4.0 - 10.5 K/uL 2.5  2.6  2.4   Hemoglobin 13.0 - 17.0 g/dL 86.9  88.1  89.2   Hematocrit 39.0 - 52.0 % 37.8  36.3  32.6   Platelets 150 - 400 K/uL 67  85  98       Latest Ref Rng & Units 04/21/2024   10:46 AM 10/02/2023    9:55 AM 08/27/2023    9:12 AM  CMP  Glucose 70 - 99 mg/dL 887  899  856   BUN 8 - 23 mg/dL 38  32  42   Creatinine 0.61 - 1.24 mg/dL 7.02  6.97  7.06   Sodium 135 - 145 mmol/L 140  135  142   Potassium 3.5 - 5.1 mmol/L 4.2  4.7  4.2   Chloride 98 - 111 mmol/L 106  107  107   CO2 22 - 32 mmol/L 26  21  21    Calcium  8.9 - 10.3 mg/dL 8.9  8.9  8.8   Total Protein 6.5 - 8.1 g/dL 5.8  6.0    Total Bilirubin 0.0 - 1.2 mg/dL 0.9  1.2    Alkaline Phos 38 - 126 U/L 93  83    AST 15 - 41 U/L 23  19    ALT 0 - 44 U/L 20  18      DIAGNOSTIC IMAGING:  I have independently reviewed the scans and discussed with the patient. No results found.   WRAP UP:  All questions were answered. The patient knows to call the clinic with any problems, questions or concerns.  Medical decision making: Moderate  Time spent on visit: I spent 20 minutes counseling the patient face to face. The total time spent in the appointment was 30 minutes and more than 50% was on counseling.  Pleasant CHRISTELLA Barefoot, PA-C  04/28/24 9:11 AM

## 2024-04-28 ENCOUNTER — Other Ambulatory Visit: Payer: Self-pay

## 2024-04-28 ENCOUNTER — Inpatient Hospital Stay: Attending: Physician Assistant | Admitting: Physician Assistant

## 2024-04-28 VITALS — BP 91/52 | HR 57 | Temp 97.4°F | Resp 16 | Wt 235.9 lb

## 2024-04-28 DIAGNOSIS — Z7969 Long term (current) use of other immunomodulators and immunosuppressants: Secondary | ICD-10-CM | POA: Insufficient documentation

## 2024-04-28 DIAGNOSIS — Z87891 Personal history of nicotine dependence: Secondary | ICD-10-CM | POA: Insufficient documentation

## 2024-04-28 DIAGNOSIS — I959 Hypotension, unspecified: Secondary | ICD-10-CM | POA: Diagnosis not present

## 2024-04-28 DIAGNOSIS — D509 Iron deficiency anemia, unspecified: Secondary | ICD-10-CM

## 2024-04-28 DIAGNOSIS — N184 Chronic kidney disease, stage 4 (severe): Secondary | ICD-10-CM | POA: Insufficient documentation

## 2024-04-28 DIAGNOSIS — C9110 Chronic lymphocytic leukemia of B-cell type not having achieved remission: Secondary | ICD-10-CM | POA: Diagnosis not present

## 2024-04-28 DIAGNOSIS — N185 Chronic kidney disease, stage 5: Secondary | ICD-10-CM | POA: Diagnosis not present

## 2024-04-28 DIAGNOSIS — D631 Anemia in chronic kidney disease: Secondary | ICD-10-CM | POA: Diagnosis not present

## 2024-04-28 NOTE — Patient Instructions (Signed)
 [facility] at [facility] **VISIT SUMMARY & IMPORTANT INSTRUCTIONS **   You were seen today by Pleasant Barefoot PA-C for your CLL.    CHRONIC LYMPHOCYTIC LEUKEMIA Your CLL is currently well-controlled on venetoclax . Continue venetoclax  300 mg daily. You will be due for annual CT scan in May 2026. We will see you for labs and physical exam in 6 months. Please contact us  sooner if you have: Drenching night sweats Unexplained fevers and chills Unintentional weight loss New lymph node swelling, lumps, or masses Frequent or recurrent infections  MEDICATIONS: - Venetoclax  300 mg daily - Iron  tablet 3 times weekly  FOLLOW-UP APPOINTMENT: 6 months  ** Thank you for trusting me with your healthcare!  I strive to provide all of my patients with quality care at each visit.  If you receive a survey for this visit, I would be so grateful to you for taking the time to provide feedback.  Thank you in advance!  ~ Cordarryl Monrreal                                        Dr. Mickiel Davonna Pleasant Barefoot, PA-C          Delon Hope, NP   - - - - - - - - - - - - - - - - - -    Thank you for choosing [facility] at [facility] to provide your oncology and hematology care.  To afford each patient quality time with our provider, please arrive at least 15 minutes before your scheduled appointment time.   If you have a lab appointment with the Cancer Center please come in thru the Main Entrance and check in at the main information desk.  You need to re-schedule your appointment should you arrive 10 or more minutes late.  We strive to give you quality time with our providers, and arriving late affects you and other patients whose appointments are after yours.  Also, if you no show three or more times for appointments you may be dismissed from the clinic at the providers discretion.     Again, thank you for choosing [facility].  Our hope is  that these requests will decrease the amount of time that you wait before being seen by our physicians.       _____________________________________________________________  Should you have questions after your visit to [facility], please contact our office at [phone] and follow the prompts.  Our office hours are 8:00 a.m. and 4:30 p.m. Monday - Friday.  Please note that voicemails left after 4:00 p.m. may not be returned until the following business day.  We are closed weekends and major holidays.  You do have access to a nurse 24-7, just call the main number to the clinic [phone] and do not press any options, hold on the line and a nurse will answer the phone.    For prescription refill requests, have your pharmacy contact our office and allow 72 hours.

## 2024-04-30 ENCOUNTER — Other Ambulatory Visit: Payer: Self-pay

## 2024-04-30 NOTE — Progress Notes (Signed)
 Specialty Pharmacy Ongoing Clinical Assessment Note  Andrew [name] is a 83 y.o. male who is being followed by the specialty pharmacy service for RxSp Oncology   Patient's specialty medication(s) reviewed today: Venetoclax  (VENCLEXTA )   Missed doses in the last 4 weeks: 0   Patient/Caregiver did not have any additional questions or concerns.   Therapeutic benefit summary: Patient is achieving benefit   Adverse events/side effects summary: No adverse events/side effects   Patient's therapy is appropriate to: Continue    Goals Addressed             This Visit's Progress    Achieve or maintain remission   On track    Patient is on track. Patient will maintain adherence.  Patient remains stable at this time.         Follow up: 6 months  [facility]

## 2024-04-30 NOTE — Progress Notes (Signed)
 Specialty Pharmacy Refill Coordination Note  Andrew [name] is a 83 y.o. male contacted today regarding refills of specialty medication(s) Venetoclax  (VENCLEXTA )   Patient requested Delivery   Delivery date: 05/02/24   Verified address: [address] [place], [place] [address]   Medication will be filled on: 05/01/24

## 2024-05-01 ENCOUNTER — Other Ambulatory Visit: Payer: Self-pay

## 2024-05-20 ENCOUNTER — Other Ambulatory Visit (HOSPITAL_COMMUNITY): Payer: Self-pay

## 2024-05-20 ENCOUNTER — Other Ambulatory Visit: Payer: Self-pay | Admitting: Student

## 2024-05-26 ENCOUNTER — Other Ambulatory Visit (HOSPITAL_COMMUNITY): Payer: Self-pay

## 2024-05-26 ENCOUNTER — Other Ambulatory Visit: Payer: Self-pay

## 2024-05-26 ENCOUNTER — Encounter: Payer: Self-pay | Admitting: *Deleted

## 2024-05-26 NOTE — Progress Notes (Signed)
 Specialty Pharmacy Refill Coordination Note  Andrew [name] is a 83 y.o. male contacted today regarding refills of specialty medication(s) Venetoclax  (VENCLEXTA )   Patient requested (Patient-Rptd) Delivery   Delivery date: 05/28/24   Verified address: (Patient-Rptd) [address], [place], [place] [address]   Medication will be filled on: 05/27/24

## 2024-06-05 ENCOUNTER — Other Ambulatory Visit: Payer: Self-pay | Admitting: Student

## 2024-06-17 ENCOUNTER — Other Ambulatory Visit: Payer: Self-pay

## 2024-06-17 ENCOUNTER — Other Ambulatory Visit (HOSPITAL_COMMUNITY): Payer: Self-pay

## 2024-06-17 NOTE — Progress Notes (Unsigned)
 Benefits Investigation Started  Reason: Copay is $2,099.22  Routed to: Rx Prior Auth Team (ATTN: Juliana)

## 2024-06-18 ENCOUNTER — Other Ambulatory Visit: Payer: Self-pay

## 2024-06-18 ENCOUNTER — Other Ambulatory Visit (HOSPITAL_COMMUNITY): Payer: Self-pay

## 2024-06-18 ENCOUNTER — Encounter (HOSPITAL_COMMUNITY): Payer: Self-pay | Admitting: Oncology

## 2024-06-19 ENCOUNTER — Other Ambulatory Visit: Payer: Self-pay

## 2024-06-19 NOTE — Progress Notes (Signed)
 Specialty Pharmacy Refill Coordination Note  Andrew [name] is a 84 y.o. male contacted today regarding refills of specialty medication(s) Venetoclax  (VENCLEXTA )   Patient requested (Patient-Rptd) Delivery   Delivery date: 06/27/24   Verified address: (Patient-Rptd) [address], [place], [place]. [address]   Medication will be filled on: 06/26/24

## 2024-06-26 ENCOUNTER — Other Ambulatory Visit: Payer: Self-pay

## 2024-06-26 ENCOUNTER — Ambulatory Visit: Admitting: Cardiology

## 2024-07-09 ENCOUNTER — Ambulatory Visit: Admitting: Physician Assistant

## 2024-10-16 ENCOUNTER — Inpatient Hospital Stay

## 2024-10-16 ENCOUNTER — Other Ambulatory Visit (HOSPITAL_COMMUNITY)

## 2024-10-24 ENCOUNTER — Inpatient Hospital Stay: Admitting: Oncology
